# Patient Record
Sex: Female | Born: 1940
Health system: Southern US, Community
[De-identification: ages and names within clinical notes are randomized; demographics above are authoritative.]

## PROBLEM LIST (undated history)

## (undated) DIAGNOSIS — E785 Hyperlipidemia, unspecified: Secondary | ICD-10-CM

## (undated) DIAGNOSIS — K219 Gastro-esophageal reflux disease without esophagitis: Secondary | ICD-10-CM

## (undated) DIAGNOSIS — F329 Major depressive disorder, single episode, unspecified: Secondary | ICD-10-CM

## (undated) DIAGNOSIS — M810 Age-related osteoporosis without current pathological fracture: Secondary | ICD-10-CM

## (undated) DIAGNOSIS — F988 Other specified behavioral and emotional disorders with onset usually occurring in childhood and adolescence: Secondary | ICD-10-CM

## (undated) DIAGNOSIS — M1990 Unspecified osteoarthritis, unspecified site: Secondary | ICD-10-CM

## (undated) DIAGNOSIS — M8580 Other specified disorders of bone density and structure, unspecified site: Secondary | ICD-10-CM

## (undated) DIAGNOSIS — G4730 Sleep apnea, unspecified: Secondary | ICD-10-CM

## (undated) DIAGNOSIS — J189 Pneumonia, unspecified organism: Secondary | ICD-10-CM

## (undated) DIAGNOSIS — E039 Hypothyroidism, unspecified: Secondary | ICD-10-CM

## (undated) DIAGNOSIS — I10 Essential (primary) hypertension: Secondary | ICD-10-CM

## (undated) DIAGNOSIS — C4492 Squamous cell carcinoma of skin, unspecified: Secondary | ICD-10-CM

## (undated) DIAGNOSIS — N200 Calculus of kidney: Secondary | ICD-10-CM

## (undated) DIAGNOSIS — Z87442 Personal history of urinary calculi: Secondary | ICD-10-CM

## (undated) DIAGNOSIS — J449 Chronic obstructive pulmonary disease, unspecified: Secondary | ICD-10-CM

## (undated) DIAGNOSIS — F419 Anxiety disorder, unspecified: Secondary | ICD-10-CM

## (undated) DIAGNOSIS — J45909 Unspecified asthma, uncomplicated: Secondary | ICD-10-CM

## (undated) DIAGNOSIS — K254 Chronic or unspecified gastric ulcer with hemorrhage: Secondary | ICD-10-CM

## (undated) DIAGNOSIS — Z9289 Personal history of other medical treatment: Secondary | ICD-10-CM

## (undated) DIAGNOSIS — T7840XA Allergy, unspecified, initial encounter: Secondary | ICD-10-CM

## (undated) DIAGNOSIS — R011 Cardiac murmur, unspecified: Secondary | ICD-10-CM

## (undated) DIAGNOSIS — I341 Nonrheumatic mitral (valve) prolapse: Secondary | ICD-10-CM

## (undated) DIAGNOSIS — C55 Malignant neoplasm of uterus, part unspecified: Secondary | ICD-10-CM

## (undated) DIAGNOSIS — M8700 Idiopathic aseptic necrosis of unspecified bone: Secondary | ICD-10-CM

## (undated) DIAGNOSIS — J439 Emphysema, unspecified: Secondary | ICD-10-CM

## (undated) DIAGNOSIS — I499 Cardiac arrhythmia, unspecified: Secondary | ICD-10-CM

## (undated) DIAGNOSIS — R0600 Dyspnea, unspecified: Secondary | ICD-10-CM

## (undated) DIAGNOSIS — D649 Anemia, unspecified: Secondary | ICD-10-CM

## (undated) DIAGNOSIS — C3490 Malignant neoplasm of unspecified part of unspecified bronchus or lung: Secondary | ICD-10-CM

## (undated) DIAGNOSIS — F32A Depression, unspecified: Secondary | ICD-10-CM

## (undated) DIAGNOSIS — C4491 Basal cell carcinoma of skin, unspecified: Secondary | ICD-10-CM

## (undated) HISTORY — DX: Essential (primary) hypertension: I10

## (undated) HISTORY — DX: Other specified disorders of bone density and structure, unspecified site: M85.80

## (undated) HISTORY — DX: Malignant neoplasm of unspecified part of unspecified bronchus or lung: C34.90

## (undated) HISTORY — DX: Emphysema, unspecified: J43.9

## (undated) HISTORY — DX: Other specified behavioral and emotional disorders with onset usually occurring in childhood and adolescence: F98.8

## (undated) HISTORY — DX: Hyperlipidemia, unspecified: E78.5

## (undated) HISTORY — DX: Idiopathic aseptic necrosis of unspecified bone: M87.00

## (undated) HISTORY — DX: Anxiety disorder, unspecified: F41.9

## (undated) HISTORY — DX: Malignant neoplasm of uterus, part unspecified: C55

## (undated) HISTORY — DX: Depression, unspecified: F32.A

## (undated) HISTORY — DX: Pneumonia, unspecified organism: J18.9

## (undated) HISTORY — DX: Chronic obstructive pulmonary disease, unspecified: J44.9

## (undated) HISTORY — DX: Cardiac arrhythmia, unspecified: I49.9

## (undated) HISTORY — DX: Age-related osteoporosis without current pathological fracture: M81.0

## (undated) HISTORY — DX: Allergy, unspecified, initial encounter: T78.40XA

## (undated) HISTORY — DX: Major depressive disorder, single episode, unspecified: F32.9

## (undated) HISTORY — DX: Basal cell carcinoma of skin, unspecified: C44.91

## (undated) HISTORY — DX: Anemia, unspecified: D64.9

## (undated) HISTORY — DX: Chronic or unspecified gastric ulcer with hemorrhage: K25.4

## (undated) HISTORY — PX: SKIN CANCER EXCISION: SHX779

## (undated) HISTORY — DX: Calculus of kidney: N20.0

## (undated) HISTORY — PX: HAMMER TOE SURGERY: SHX385

## (undated) HISTORY — DX: Gastro-esophageal reflux disease without esophagitis: K21.9

## (undated) HISTORY — PX: THORACOTOMY/LOBECTOMY: SHX6116

---

## 1986-09-11 HISTORY — PX: ABDOMINAL HYSTERECTOMY: SHX81

## 1986-09-11 HISTORY — PX: SALPINGOOPHORECTOMY: SHX82

## 1999-01-27 ENCOUNTER — Other Ambulatory Visit: Admission: RE | Admit: 1999-01-27 | Discharge: 1999-01-27 | Payer: Self-pay | Admitting: Obstetrics and Gynecology

## 1999-05-29 ENCOUNTER — Emergency Department (HOSPITAL_COMMUNITY): Admission: EM | Admit: 1999-05-29 | Discharge: 1999-05-29 | Payer: Self-pay | Admitting: Emergency Medicine

## 1999-05-29 ENCOUNTER — Encounter: Payer: Self-pay | Admitting: *Deleted

## 2000-04-27 ENCOUNTER — Other Ambulatory Visit: Admission: RE | Admit: 2000-04-27 | Discharge: 2000-04-27 | Payer: Self-pay | Admitting: Internal Medicine

## 2000-05-02 ENCOUNTER — Encounter: Admission: RE | Admit: 2000-05-02 | Discharge: 2000-05-02 | Payer: Self-pay | Admitting: Internal Medicine

## 2000-05-02 ENCOUNTER — Encounter: Payer: Self-pay | Admitting: Internal Medicine

## 2001-12-06 ENCOUNTER — Other Ambulatory Visit: Admission: RE | Admit: 2001-12-06 | Discharge: 2001-12-06 | Payer: Self-pay | Admitting: Internal Medicine

## 2001-12-12 ENCOUNTER — Encounter: Payer: Self-pay | Admitting: Internal Medicine

## 2001-12-12 ENCOUNTER — Encounter: Admission: RE | Admit: 2001-12-12 | Discharge: 2001-12-12 | Payer: Self-pay | Admitting: Internal Medicine

## 2002-12-11 ENCOUNTER — Other Ambulatory Visit: Admission: RE | Admit: 2002-12-11 | Discharge: 2002-12-11 | Payer: Self-pay | Admitting: Internal Medicine

## 2002-12-23 ENCOUNTER — Encounter: Admission: RE | Admit: 2002-12-23 | Discharge: 2002-12-23 | Payer: Self-pay | Admitting: Internal Medicine

## 2002-12-23 ENCOUNTER — Encounter: Payer: Self-pay | Admitting: Internal Medicine

## 2004-03-31 ENCOUNTER — Other Ambulatory Visit: Admission: RE | Admit: 2004-03-31 | Discharge: 2004-03-31 | Payer: Self-pay | Admitting: Internal Medicine

## 2004-04-01 ENCOUNTER — Encounter: Admission: RE | Admit: 2004-04-01 | Discharge: 2004-04-01 | Payer: Self-pay | Admitting: Internal Medicine

## 2004-04-19 ENCOUNTER — Emergency Department (HOSPITAL_COMMUNITY): Admission: EM | Admit: 2004-04-19 | Discharge: 2004-04-19 | Payer: Self-pay | Admitting: Emergency Medicine

## 2004-10-19 ENCOUNTER — Emergency Department (HOSPITAL_COMMUNITY): Admission: EM | Admit: 2004-10-19 | Discharge: 2004-10-19 | Payer: Self-pay | Admitting: Family Medicine

## 2005-06-11 DIAGNOSIS — Z9289 Personal history of other medical treatment: Secondary | ICD-10-CM

## 2005-06-11 HISTORY — PX: THORACOTOMY: SUR1349

## 2005-06-11 HISTORY — DX: Personal history of other medical treatment: Z92.89

## 2005-06-30 ENCOUNTER — Ambulatory Visit: Payer: Self-pay | Admitting: Cardiology

## 2005-06-30 ENCOUNTER — Encounter: Admission: RE | Admit: 2005-06-30 | Discharge: 2005-06-30 | Payer: Self-pay | Admitting: Internal Medicine

## 2005-07-29 ENCOUNTER — Inpatient Hospital Stay (HOSPITAL_COMMUNITY): Admission: AD | Admit: 2005-07-29 | Discharge: 2005-08-18 | Payer: Self-pay | Admitting: Emergency Medicine

## 2005-07-31 ENCOUNTER — Encounter (INDEPENDENT_AMBULATORY_CARE_PROVIDER_SITE_OTHER): Payer: Self-pay | Admitting: Specialist

## 2005-08-02 ENCOUNTER — Encounter (INDEPENDENT_AMBULATORY_CARE_PROVIDER_SITE_OTHER): Payer: Self-pay | Admitting: *Deleted

## 2005-08-10 ENCOUNTER — Encounter: Payer: Self-pay | Admitting: Thoracic Surgery (Cardiothoracic Vascular Surgery)

## 2005-08-23 ENCOUNTER — Encounter: Admission: RE | Admit: 2005-08-23 | Discharge: 2005-08-23 | Payer: Self-pay | Admitting: Surgery

## 2005-09-05 ENCOUNTER — Encounter: Admission: RE | Admit: 2005-09-05 | Discharge: 2005-09-05 | Payer: Self-pay | Admitting: Surgery

## 2005-09-26 ENCOUNTER — Encounter (HOSPITAL_COMMUNITY): Admission: RE | Admit: 2005-09-26 | Discharge: 2005-12-25 | Payer: Self-pay | Admitting: Family Medicine

## 2005-10-17 ENCOUNTER — Encounter: Admission: RE | Admit: 2005-10-17 | Discharge: 2005-10-17 | Payer: Self-pay | Admitting: Surgery

## 2005-10-20 ENCOUNTER — Ambulatory Visit (HOSPITAL_COMMUNITY): Admission: RE | Admit: 2005-10-20 | Discharge: 2005-10-20 | Payer: Self-pay | Admitting: Gastroenterology

## 2005-12-05 ENCOUNTER — Ambulatory Visit: Payer: Self-pay | Admitting: Emergency Medicine

## 2006-01-05 ENCOUNTER — Other Ambulatory Visit: Admission: RE | Admit: 2006-01-05 | Discharge: 2006-01-05 | Payer: Self-pay | Admitting: Family Medicine

## 2006-06-01 ENCOUNTER — Encounter: Admission: RE | Admit: 2006-06-01 | Discharge: 2006-06-01 | Payer: Self-pay | Admitting: Family Medicine

## 2006-06-07 ENCOUNTER — Encounter: Payer: Self-pay | Admitting: Cardiology

## 2006-06-07 ENCOUNTER — Ambulatory Visit: Payer: Self-pay | Admitting: Cardiology

## 2006-06-07 ENCOUNTER — Ambulatory Visit (HOSPITAL_COMMUNITY): Admission: RE | Admit: 2006-06-07 | Discharge: 2006-06-07 | Payer: Self-pay | Admitting: Family Medicine

## 2006-06-07 ENCOUNTER — Ambulatory Visit: Payer: Self-pay

## 2006-06-08 ENCOUNTER — Ambulatory Visit: Payer: Self-pay | Admitting: Cardiology

## 2006-06-18 ENCOUNTER — Ambulatory Visit: Payer: Self-pay | Admitting: Cardiology

## 2006-06-27 ENCOUNTER — Ambulatory Visit: Payer: Self-pay | Admitting: Emergency Medicine

## 2006-07-24 ENCOUNTER — Ambulatory Visit: Payer: Self-pay | Admitting: Emergency Medicine

## 2006-07-25 ENCOUNTER — Ambulatory Visit: Payer: Self-pay | Admitting: Cardiology

## 2006-08-01 ENCOUNTER — Encounter: Admission: RE | Admit: 2006-08-01 | Discharge: 2006-08-01 | Payer: Self-pay | Admitting: Family Medicine

## 2006-08-27 ENCOUNTER — Ambulatory Visit: Payer: Self-pay | Admitting: Emergency Medicine

## 2006-08-28 ENCOUNTER — Ambulatory Visit: Admission: RE | Admit: 2006-08-28 | Discharge: 2006-08-28 | Payer: Self-pay | Admitting: Emergency Medicine

## 2006-08-30 ENCOUNTER — Ambulatory Visit (HOSPITAL_COMMUNITY): Admission: RE | Admit: 2006-08-30 | Discharge: 2006-08-30 | Payer: Self-pay | Admitting: Emergency Medicine

## 2006-08-30 ENCOUNTER — Ambulatory Visit: Payer: Self-pay | Admitting: Emergency Medicine

## 2006-08-30 ENCOUNTER — Encounter (INDEPENDENT_AMBULATORY_CARE_PROVIDER_SITE_OTHER): Payer: Self-pay | Admitting: *Deleted

## 2006-10-29 ENCOUNTER — Ambulatory Visit: Payer: Self-pay | Admitting: Internal Medicine

## 2007-05-23 ENCOUNTER — Ambulatory Visit: Payer: Self-pay | Admitting: Emergency Medicine

## 2007-07-03 DIAGNOSIS — I4949 Other premature depolarization: Secondary | ICD-10-CM | POA: Insufficient documentation

## 2007-07-03 DIAGNOSIS — J309 Allergic rhinitis, unspecified: Secondary | ICD-10-CM | POA: Insufficient documentation

## 2007-07-03 DIAGNOSIS — Z9079 Acquired absence of other genital organ(s): Secondary | ICD-10-CM | POA: Insufficient documentation

## 2007-07-03 DIAGNOSIS — Z8679 Personal history of other diseases of the circulatory system: Secondary | ICD-10-CM | POA: Insufficient documentation

## 2007-07-03 DIAGNOSIS — J869 Pyothorax without fistula: Secondary | ICD-10-CM | POA: Insufficient documentation

## 2007-07-03 DIAGNOSIS — J449 Chronic obstructive pulmonary disease, unspecified: Secondary | ICD-10-CM | POA: Insufficient documentation

## 2007-07-03 DIAGNOSIS — K922 Gastrointestinal hemorrhage, unspecified: Secondary | ICD-10-CM | POA: Insufficient documentation

## 2007-08-15 ENCOUNTER — Encounter: Admission: RE | Admit: 2007-08-15 | Discharge: 2007-08-15 | Payer: Self-pay | Admitting: Family Medicine

## 2008-01-13 ENCOUNTER — Emergency Department (HOSPITAL_COMMUNITY): Admission: EM | Admit: 2008-01-13 | Discharge: 2008-01-13 | Payer: Self-pay | Admitting: Emergency Medicine

## 2008-08-17 ENCOUNTER — Encounter: Admission: RE | Admit: 2008-08-17 | Discharge: 2008-08-17 | Payer: Self-pay | Admitting: Family Medicine

## 2009-03-11 ENCOUNTER — Encounter: Admission: RE | Admit: 2009-03-11 | Discharge: 2009-03-11 | Payer: Self-pay | Admitting: Family Medicine

## 2009-08-11 ENCOUNTER — Telehealth (INDEPENDENT_AMBULATORY_CARE_PROVIDER_SITE_OTHER): Payer: Self-pay

## 2009-08-12 ENCOUNTER — Ambulatory Visit: Payer: Self-pay

## 2009-08-12 ENCOUNTER — Encounter (HOSPITAL_COMMUNITY): Admission: RE | Admit: 2009-08-12 | Discharge: 2009-09-09 | Payer: Self-pay | Admitting: Family Medicine

## 2009-08-12 ENCOUNTER — Encounter: Payer: Self-pay | Admitting: Cardiology

## 2009-08-12 ENCOUNTER — Ambulatory Visit: Payer: Self-pay | Admitting: Cardiology

## 2009-08-13 ENCOUNTER — Ambulatory Visit: Payer: Self-pay

## 2009-08-13 ENCOUNTER — Ambulatory Visit: Payer: Self-pay | Admitting: Cardiology

## 2009-08-13 ENCOUNTER — Ambulatory Visit (HOSPITAL_COMMUNITY): Admission: RE | Admit: 2009-08-13 | Discharge: 2009-08-13 | Payer: Self-pay | Admitting: Family Medicine

## 2009-08-13 ENCOUNTER — Encounter (INDEPENDENT_AMBULATORY_CARE_PROVIDER_SITE_OTHER): Payer: Self-pay | Admitting: Family Medicine

## 2009-08-18 ENCOUNTER — Encounter: Admission: RE | Admit: 2009-08-18 | Discharge: 2009-08-18 | Payer: Self-pay | Admitting: Family Medicine

## 2009-08-25 ENCOUNTER — Encounter: Admission: RE | Admit: 2009-08-25 | Discharge: 2009-08-25 | Payer: Self-pay | Admitting: Family Medicine

## 2009-09-01 ENCOUNTER — Ambulatory Visit: Payer: Self-pay | Admitting: Thoracic Surgery

## 2009-09-06 ENCOUNTER — Ambulatory Visit (HOSPITAL_COMMUNITY): Admission: RE | Admit: 2009-09-06 | Discharge: 2009-09-06 | Payer: Self-pay | Admitting: Thoracic Surgery

## 2009-09-07 ENCOUNTER — Ambulatory Visit: Payer: Self-pay | Admitting: Thoracic Surgery

## 2009-09-14 ENCOUNTER — Ambulatory Visit (HOSPITAL_COMMUNITY): Admission: RE | Admit: 2009-09-14 | Discharge: 2009-09-14 | Payer: Self-pay | Admitting: Thoracic Surgery

## 2009-09-15 ENCOUNTER — Ambulatory Visit: Payer: Self-pay | Admitting: Thoracic Surgery

## 2009-09-22 ENCOUNTER — Telehealth (INDEPENDENT_AMBULATORY_CARE_PROVIDER_SITE_OTHER): Payer: Self-pay | Admitting: *Deleted

## 2009-09-23 ENCOUNTER — Encounter: Payer: Self-pay | Admitting: Thoracic Surgery

## 2009-09-23 ENCOUNTER — Inpatient Hospital Stay (HOSPITAL_COMMUNITY): Admission: RE | Admit: 2009-09-23 | Discharge: 2009-09-29 | Payer: Self-pay | Admitting: Thoracic Surgery

## 2009-09-23 ENCOUNTER — Ambulatory Visit: Payer: Self-pay | Admitting: Thoracic Surgery

## 2009-10-06 ENCOUNTER — Ambulatory Visit: Payer: Self-pay | Admitting: Internal Medicine

## 2009-10-06 ENCOUNTER — Encounter: Admission: RE | Admit: 2009-10-06 | Discharge: 2009-10-06 | Payer: Self-pay | Admitting: Thoracic Surgery

## 2009-10-06 ENCOUNTER — Ambulatory Visit: Payer: Self-pay | Admitting: Thoracic Surgery

## 2009-10-20 LAB — CBC WITH DIFFERENTIAL/PLATELET
BASO%: 0.4 % (ref 0.0–2.0)
Basophils Absolute: 0 10*3/uL (ref 0.0–0.1)
EOS%: 2.5 % (ref 0.0–7.0)
Eosinophils Absolute: 0.2 10*3/uL (ref 0.0–0.5)
HCT: 33.6 % — ABNORMAL LOW (ref 34.8–46.6)
HGB: 11.4 g/dL — ABNORMAL LOW (ref 11.6–15.9)
LYMPH%: 22.6 % (ref 14.0–49.7)
MCH: 32.7 pg (ref 25.1–34.0)
MCHC: 34 g/dL (ref 31.5–36.0)
MCV: 96.1 fL (ref 79.5–101.0)
MONO#: 0.9 10*3/uL (ref 0.1–0.9)
MONO%: 11.2 % (ref 0.0–14.0)
NEUT#: 5 10*3/uL (ref 1.5–6.5)
NEUT%: 63.3 % (ref 38.4–76.8)
Platelets: 357 10*3/uL (ref 145–400)
RBC: 3.5 10*6/uL — ABNORMAL LOW (ref 3.70–5.45)
RDW: 15.3 % — ABNORMAL HIGH (ref 11.2–14.5)
WBC: 7.9 10*3/uL (ref 3.9–10.3)
lymph#: 1.8 10*3/uL (ref 0.9–3.3)

## 2009-10-20 LAB — COMPREHENSIVE METABOLIC PANEL
ALT: 16 U/L (ref 0–35)
AST: 17 U/L (ref 0–37)
Albumin: 4.3 g/dL (ref 3.5–5.2)
Alkaline Phosphatase: 72 U/L (ref 39–117)
BUN: 15 mg/dL (ref 6–23)
CO2: 25 mEq/L (ref 19–32)
Calcium: 9.3 mg/dL (ref 8.4–10.5)
Chloride: 103 mEq/L (ref 96–112)
Creatinine, Ser: 0.64 mg/dL (ref 0.40–1.20)
Glucose, Bld: 90 mg/dL (ref 70–99)
Potassium: 4.1 mEq/L (ref 3.5–5.3)
Sodium: 138 mEq/L (ref 135–145)
Total Bilirubin: 0.3 mg/dL (ref 0.3–1.2)
Total Protein: 6.8 g/dL (ref 6.0–8.3)

## 2009-10-27 ENCOUNTER — Encounter: Admission: RE | Admit: 2009-10-27 | Discharge: 2009-10-27 | Payer: Self-pay | Admitting: Thoracic Surgery

## 2009-10-27 ENCOUNTER — Ambulatory Visit: Payer: Self-pay | Admitting: Thoracic Surgery

## 2009-12-06 ENCOUNTER — Emergency Department (HOSPITAL_COMMUNITY): Admission: EM | Admit: 2009-12-06 | Discharge: 2009-12-06 | Payer: Self-pay | Admitting: Emergency Medicine

## 2009-12-07 ENCOUNTER — Ambulatory Visit: Payer: Self-pay | Admitting: Thoracic Surgery

## 2009-12-15 ENCOUNTER — Encounter: Admission: RE | Admit: 2009-12-15 | Discharge: 2009-12-15 | Payer: Self-pay | Admitting: Thoracic Surgery

## 2009-12-15 ENCOUNTER — Ambulatory Visit: Payer: Self-pay | Admitting: Thoracic Surgery

## 2009-12-28 ENCOUNTER — Ambulatory Visit: Payer: Self-pay | Admitting: Thoracic Surgery

## 2009-12-28 ENCOUNTER — Inpatient Hospital Stay (HOSPITAL_COMMUNITY): Admission: RE | Admit: 2009-12-28 | Discharge: 2010-01-02 | Payer: Self-pay | Admitting: Thoracic Surgery

## 2009-12-28 ENCOUNTER — Encounter: Payer: Self-pay | Admitting: Thoracic Surgery

## 2010-01-11 ENCOUNTER — Ambulatory Visit: Payer: Self-pay | Admitting: Thoracic Surgery

## 2010-01-11 ENCOUNTER — Encounter: Admission: RE | Admit: 2010-01-11 | Discharge: 2010-01-11 | Payer: Self-pay | Admitting: Thoracic Surgery

## 2010-01-13 ENCOUNTER — Ambulatory Visit: Payer: Self-pay | Admitting: Internal Medicine

## 2010-01-26 ENCOUNTER — Ambulatory Visit: Payer: Self-pay | Admitting: Thoracic Surgery

## 2010-01-26 ENCOUNTER — Encounter: Admission: RE | Admit: 2010-01-26 | Discharge: 2010-01-26 | Payer: Self-pay | Admitting: Thoracic Surgery

## 2010-02-23 ENCOUNTER — Ambulatory Visit: Payer: Self-pay | Admitting: Thoracic Surgery

## 2010-02-23 ENCOUNTER — Encounter: Admission: RE | Admit: 2010-02-23 | Discharge: 2010-02-23 | Payer: Self-pay | Admitting: Thoracic Surgery

## 2010-04-06 ENCOUNTER — Encounter: Admission: RE | Admit: 2010-04-06 | Discharge: 2010-04-06 | Payer: Self-pay | Admitting: Thoracic Surgery

## 2010-04-06 ENCOUNTER — Ambulatory Visit: Payer: Self-pay | Admitting: Thoracic Surgery

## 2010-04-28 ENCOUNTER — Encounter (HOSPITAL_COMMUNITY): Admission: RE | Admit: 2010-04-28 | Discharge: 2010-06-10 | Payer: Self-pay | Admitting: Internal Medicine

## 2010-04-28 ENCOUNTER — Encounter: Payer: Self-pay | Admitting: Internal Medicine

## 2010-06-08 ENCOUNTER — Encounter: Admission: RE | Admit: 2010-06-08 | Discharge: 2010-06-08 | Payer: Self-pay | Admitting: Thoracic Surgery

## 2010-06-08 ENCOUNTER — Ambulatory Visit: Payer: Self-pay | Admitting: Thoracic Surgery

## 2010-06-11 ENCOUNTER — Encounter (HOSPITAL_COMMUNITY)
Admission: RE | Admit: 2010-06-11 | Discharge: 2010-09-09 | Payer: Self-pay | Source: Home / Self Care | Attending: Internal Medicine | Admitting: Internal Medicine

## 2010-08-09 ENCOUNTER — Encounter: Admission: RE | Admit: 2010-08-09 | Discharge: 2010-08-09 | Payer: Self-pay | Admitting: Thoracic Surgery

## 2010-08-09 ENCOUNTER — Ambulatory Visit: Payer: Self-pay | Admitting: Thoracic Surgery

## 2010-09-11 ENCOUNTER — Encounter (HOSPITAL_COMMUNITY)
Admission: RE | Admit: 2010-09-11 | Discharge: 2010-10-11 | Payer: Self-pay | Source: Home / Self Care | Attending: Internal Medicine | Admitting: Internal Medicine

## 2010-09-28 ENCOUNTER — Encounter
Admission: RE | Admit: 2010-09-28 | Discharge: 2010-09-28 | Payer: Self-pay | Source: Home / Self Care | Attending: Family Medicine | Admitting: Family Medicine

## 2010-10-02 ENCOUNTER — Encounter: Payer: Self-pay | Admitting: Internal Medicine

## 2010-10-11 ENCOUNTER — Other Ambulatory Visit: Payer: Self-pay | Admitting: Thoracic Surgery

## 2010-10-11 DIAGNOSIS — D381 Neoplasm of uncertain behavior of trachea, bronchus and lung: Secondary | ICD-10-CM

## 2010-10-12 ENCOUNTER — Ambulatory Visit: Admit: 2010-10-12 | Payer: Self-pay | Admitting: Thoracic Surgery

## 2010-10-12 ENCOUNTER — Ambulatory Visit: Payer: Medicare Other | Admitting: Thoracic Surgery

## 2010-10-12 ENCOUNTER — Ambulatory Visit
Admission: RE | Admit: 2010-10-12 | Discharge: 2010-10-12 | Disposition: A | Discharge status: Home / Self Care | Payer: Medicare Other | Source: Ambulatory Visit | Attending: Thoracic Surgery | Admitting: Thoracic Surgery

## 2010-10-12 DIAGNOSIS — C3410 Malignant neoplasm of upper lobe, unspecified bronchus or lung: Secondary | ICD-10-CM

## 2010-10-12 DIAGNOSIS — D381 Neoplasm of uncertain behavior of trachea, bronchus and lung: Secondary | ICD-10-CM

## 2010-10-13 NOTE — Assessment & Plan Note (Signed)
Summary: Cardiology Nuclear Study  Nuclear Med Background Indications for Stress Test: Evaluation for Ischemia   History: COPD, Echo, Myocardial Perfusion Study  History Comments: 9/05 MPS:NL,EF=70%, NSVT at peak. '07 Echo: EF=55-65%.  Symptoms: DOE    Nuclear Pre-Procedure Cardiac Risk Factors: Family History - CAD, History of Smoking, Hypertension, Lipids Caffeine/Decaff Intake: None NPO After: 9:00 PM Lungs: clear IV 0.9% NS with Angio Cath: 22g     IV Site: (R) Wrist IV Started by: Irean Hong RN Chest Size (in) 38     Cup Size C     Height (in): 67.5 Weight (lb): 169 BMI: 26.17  Nuclear Med Study 1 or 2 day study:  1 day     Stress Test Type:  Eugenie Birks Reading MD:  Marca Ancona, MD     Referring MD:  T.Pickard Resting Radionuclide:  Technetium 27m Tetrofosmin     Resting Radionuclide Dose:  11 mCi  Stress Radionuclide:  Technetium 68m Tetrofosmin     Stress Radionuclide Dose:  33 mCi   Stress Protocol   Lexiscan: 0.4 mg   Stress Test Technologist:  Milana Na EMT-P     Nuclear Technologist:  Domenic Polite CNMT  Rest Procedure  Myocardial perfusion imaging was performed at rest 45 minutes following the intravenous administration of Myoview Technetium 73m Tetrofosmin.  Stress Procedure  The patient received IV Lexiscan 0.4 mg over 15-seconds.  Myoview injected at 30-seconds.  There were no significant changes with infusion.  Quantitative spect images were obtained after a 45 minute delay.  QPS Raw Data Images:  Normal; no motion artifact; normal heart/lung ratio. Stress Images:  There is normal uptake in all areas. Rest Images:  Normal homogeneous uptake in all areas of the myocardium. Subtraction (SDS):  There is no evidence of scar or ischemia. Transient Ischemic Dilatation:  1.09  (Normal <1.22)  Lung/Heart Ratio:  .31  (Normal <0.45)  Quantitative Gated Spect Images QGS EDV:  87 ml QGS ESV:  20 ml QGS EF:  77 % QGS cine images:  Normal wall  motion.    Overall Impression  Exercise Capacity: Lexiscan study BP Response: Normal blood pressure response. Clinical Symptoms: Chest pressure ECG Impression: No significant ST segment change suggestive of ischemia. Overall Impression: Normal stress nuclear study.

## 2010-10-13 NOTE — Progress Notes (Signed)
Summary: Nuc. Pre-Procedure  Phone Note Outgoing Call Call back at cell [phone]   Call placed by: Irean Hong, RN,  August 11, 2009 10:43 AM Summary of Call: Reviewed information on Myoview Information Sheet (see scanned document for further details).  Spoke with patient.     Nuclear Med Background Indications for Stress Test: Evaluation for Ischemia   History: COPD, Echo, Myocardial Perfusion Study  History Comments: 9/05 MPS:NL,EF=70%, NSVT at peak. '07 Echo: EF=55-65%.  Symptoms: DOE    Nuclear Pre-Procedure Cardiac Risk Factors: Family History - CAD, History of Smoking, Hypertension, Lipids

## 2010-10-13 NOTE — Progress Notes (Signed)
   Phone Note Other Incoming   Caller: Olegario Messier Action Taken: Information Sent Initial call taken by: Marijean Niemann LOV,Echo over to [facility] to fax [phone] [facility]  September 22, 2009 1:21 PM

## 2010-10-13 NOTE — Miscellaneous (Signed)
Summary: Pulmo/[place]  Pulmo/Lester   Imported By: Lester Paris 05/02/2010 10:42:03  _____________________________________________________________________  External Attachment:    Type:   Image     Comment:   External Document

## 2010-10-24 NOTE — Letter (Addendum)
October 12, 2010  Dietrich Pates, MD [address] [place], [place]  [address]  Re:  [name], [name]             DOB:  Jul 05, 1941  Dear Dr. Okey Dupre,  I appreciate the opportunity to see [name].  I reviewed your notes and I agree with you that this long-term followup is all that is needed now.  We got a chest x-ray today which showed normal postoperative changes, incision is well-healed.  She is taking no pain medication. Her blood pressure is 125/79, pulse is 68, respirations 15, O2 sats were 97%.  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  [name], M.D. Electronically Signed  DPB/MEDQ  D:  10/12/2010  T:  10/13/2010  Job:  [id number]  cc:   Broadus John T. Tanya Nones, MD

## 2010-11-27 LAB — BASIC METABOLIC PANEL
BUN: 13 mg/dL (ref 6–23)
BUN: 7 mg/dL (ref 6–23)
BUN: 8 mg/dL (ref 6–23)
CO2: 25 mEq/L (ref 19–32)
CO2: 29 mEq/L (ref 19–32)
CO2: 29 mEq/L (ref 19–32)
Calcium: 7.5 mg/dL — ABNORMAL LOW (ref 8.4–10.5)
Calcium: 7.6 mg/dL — ABNORMAL LOW (ref 8.4–10.5)
Calcium: 7.9 mg/dL — ABNORMAL LOW (ref 8.4–10.5)
Chloride: 99 mEq/L (ref 96–112)
Chloride: 98 mEq/L (ref 96–112)
Chloride: 98 mEq/L (ref 96–112)
Creatinine, Ser: 0.65 mg/dL (ref 0.4–1.2)
Creatinine, Ser: 0.48 mg/dL (ref 0.4–1.2)
Creatinine, Ser: 0.6 mg/dL (ref 0.4–1.2)
GFR calc Af Amer: >60 mL/min (ref >60)
GFR calc Af Amer: >60 mL/min (ref >60)
GFR calc Af Amer: >60 mL/min (ref >60)
GFR calc non Af Amer: >60 mL/min (ref >60)
GFR calc non Af Amer: >60 mL/min (ref >60)
GFR calc non Af Amer: >60 mL/min (ref >60)
Glucose, Bld: 135 mg/dL — ABNORMAL HIGH (ref 70–99)
Glucose, Bld: 105 mg/dL — ABNORMAL HIGH (ref 70–99)
Glucose, Bld: 116 mg/dL — ABNORMAL HIGH (ref 70–99)
Potassium: 3.8 mEq/L (ref 3.5–5.1)
Potassium: 3.5 mEq/L (ref 3.5–5.1)
Potassium: 3.7 mEq/L (ref 3.5–5.1)
Sodium: 132 mEq/L — ABNORMAL LOW (ref 135–145)
Sodium: 131 mEq/L — ABNORMAL LOW (ref 135–145)
Sodium: 132 mEq/L — ABNORMAL LOW (ref 135–145)

## 2010-11-27 LAB — CBC
HCT: 22.7 % — ABNORMAL LOW (ref 36.0–46.0)
HCT: 23.6 % — ABNORMAL LOW (ref 36.0–46.0)
HCT: 39.4 % (ref 36.0–46.0)
HCT: 41.1 % (ref 36.0–46.0)
HCT: 21.6 % — ABNORMAL LOW (ref 36.0–46.0)
HCT: 23.2 % — ABNORMAL LOW (ref 36.0–46.0)
Hemoglobin: 13.7 g/dL (ref 12.0–15.0)
Hemoglobin: 14.4 g/dL (ref 12.0–15.0)
Hemoglobin: 7.9 g/dL — ABNORMAL LOW (ref 12.0–15.0)
Hemoglobin: 8.2 g/dL — ABNORMAL LOW (ref 12.0–15.0)
Hemoglobin: 7.5 g/dL — ABNORMAL LOW (ref 12.0–15.0)
Hemoglobin: 8.1 g/dL — ABNORMAL LOW (ref 12.0–15.0)
MCHC: 34.7 g/dL (ref 30.0–36.0)
MCHC: 34.7 g/dL (ref 30.0–36.0)
MCHC: 34.9 g/dL (ref 30.0–36.0)
MCHC: 35 g/dL (ref 30.0–36.0)
MCHC: 34.7 g/dL (ref 30.0–36.0)
MCHC: 34.8 g/dL (ref 30.0–36.0)
MCV: 95.3 fL (ref 78.0–100.0)
MCV: 95.7 fL (ref 78.0–100.0)
MCV: 95.8 fL (ref 78.0–100.0)
MCV: 95.9 fL (ref 78.0–100.0)
MCV: 94.7 fL (ref 78.0–100.0)
MCV: 95.9 fL (ref 78.0–100.0)
Platelets: 159 10*3/uL (ref 150–400)
Platelets: 182 10*3/uL (ref 150–400)
Platelets: 259 10*3/uL (ref 150–400)
Platelets: 273 10*3/uL (ref 150–400)
Platelets: 177 10*3/uL (ref 150–400)
Platelets: 261 10*3/uL (ref 150–400)
RBC: 2.37 MIL/uL — ABNORMAL LOW (ref 3.87–5.11)
RBC: 2.47 MIL/uL — ABNORMAL LOW (ref 3.87–5.11)
RBC: 4.13 MIL/uL (ref 3.87–5.11)
RBC: 4.3 MIL/uL (ref 3.87–5.11)
RBC: 2.26 MIL/uL — ABNORMAL LOW (ref 3.87–5.11)
RBC: 2.45 MIL/uL — ABNORMAL LOW (ref 3.87–5.11)
RDW: 12.7 % (ref 11.5–15.5)
RDW: 13 % (ref 11.5–15.5)
RDW: 13.1 % (ref 11.5–15.5)
RDW: 13.6 % (ref 11.5–15.5)
RDW: 13.4 % (ref 11.5–15.5)
RDW: 13.6 % (ref 11.5–15.5)
WBC: 12.5 10*3/uL — ABNORMAL HIGH (ref 4.0–10.5)
WBC: 14.7 10*3/uL — ABNORMAL HIGH (ref 4.0–10.5)
WBC: 8.7 10*3/uL (ref 4.0–10.5)
WBC: 9.6 10*3/uL (ref 4.0–10.5)
WBC: 10.3 10*3/uL (ref 4.0–10.5)
WBC: 12.1 10*3/uL — ABNORMAL HIGH (ref 4.0–10.5)

## 2010-11-27 LAB — URINALYSIS, ROUTINE W REFLEX MICROSCOPIC
Bilirubin Urine: NEGATIVE
Glucose, UA: NEGATIVE mg/dL
Hgb urine dipstick: NEGATIVE
Ketones, ur: NEGATIVE mg/dL
Nitrite: NEGATIVE
Protein, ur: NEGATIVE mg/dL
Specific Gravity, Urine: 1.024 (ref 1.005–1.030)
Urobilinogen, UA: 0.2 mg/dL (ref 0.0–1.0)
pH: 5.5 (ref 5.0–8.0)

## 2010-11-27 LAB — BLOOD GAS, ARTERIAL
Acid-base deficit: 0.6 mmol/L (ref 0.0–2.0)
Bicarbonate: 23 mEq/L (ref 20.0–24.0)
Drawn by: 313941
FIO2: 0.21 %
O2 Saturation: 97.1 %
Patient temperature: 98.6
TCO2: 24.1 mmol/L (ref 0–100)
pCO2 arterial: 34.2 mmHg — ABNORMAL LOW (ref 35.0–45.0)
pH, Arterial: 7.442 — ABNORMAL HIGH (ref 7.350–7.400)
pO2, Arterial: 88 mmHg (ref 80.0–100.0)

## 2010-11-27 LAB — COMPREHENSIVE METABOLIC PANEL
ALT: 16 U/L (ref 0–35)
ALT: 21 U/L (ref 0–35)
AST: 25 U/L (ref 0–37)
AST: 25 U/L (ref 0–37)
Albumin: 2.5 g/dL — ABNORMAL LOW (ref 3.5–5.2)
Albumin: 4.3 g/dL (ref 3.5–5.2)
Alkaline Phosphatase: 35 U/L — ABNORMAL LOW (ref 39–117)
Alkaline Phosphatase: 66 U/L (ref 39–117)
BUN: 16 mg/dL (ref 6–23)
BUN: 7 mg/dL (ref 6–23)
CO2: 21 mEq/L (ref 19–32)
CO2: 26 mEq/L (ref 19–32)
Calcium: 7.5 mg/dL — ABNORMAL LOW (ref 8.4–10.5)
Calcium: 9.8 mg/dL (ref 8.4–10.5)
Chloride: 101 mEq/L (ref 96–112)
Chloride: 103 mEq/L (ref 96–112)
Creatinine, Ser: 0.53 mg/dL (ref 0.4–1.2)
Creatinine, Ser: 0.65 mg/dL (ref 0.4–1.2)
GFR calc Af Amer: >60 mL/min (ref >60)
GFR calc Af Amer: >60 mL/min (ref >60)
GFR calc non Af Amer: >60 mL/min (ref >60)
GFR calc non Af Amer: >60 mL/min (ref >60)
Glucose, Bld: 108 mg/dL — ABNORMAL HIGH (ref 70–99)
Glucose, Bld: 126 mg/dL — ABNORMAL HIGH (ref 70–99)
Potassium: 3.5 mEq/L (ref 3.5–5.1)
Potassium: 4.4 mEq/L (ref 3.5–5.1)
Sodium: 132 mEq/L — ABNORMAL LOW (ref 135–145)
Sodium: 134 mEq/L — ABNORMAL LOW (ref 135–145)
Total Bilirubin: 0.3 mg/dL (ref 0.3–1.2)
Total Bilirubin: 0.5 mg/dL (ref 0.3–1.2)
Total Protein: 4.5 g/dL — ABNORMAL LOW (ref 6.0–8.3)
Total Protein: 6.7 g/dL (ref 6.0–8.3)

## 2010-11-27 LAB — TYPE AND SCREEN
ABO/RH(D): O POS
Antibody Screen: NEGATIVE

## 2010-11-27 LAB — POCT I-STAT 3, ART BLOOD GAS (G3+)
Acid-base deficit: 1 mmol/L (ref 0.0–2.0)
Bicarbonate: 23 mEq/L (ref 20.0–24.0)
O2 Saturation: 98 %
Patient temperature: 98.4
TCO2: 24 mmol/L (ref 0–100)
pCO2 arterial: 36.2 mmHg (ref 35.0–45.0)
pH, Arterial: 7.41 — ABNORMAL HIGH (ref 7.350–7.400)
pO2, Arterial: 94 mmHg (ref 80.0–100.0)

## 2010-11-27 LAB — APTT
aPTT: 27 seconds (ref 24–37)
aPTT: 29 seconds (ref 24–37)

## 2010-11-27 LAB — PROTIME-INR
INR: 0.83 (ref 0.00–1.49)
INR: 0.88 (ref 0.00–1.49)
Prothrombin Time: 11.3 seconds — ABNORMAL LOW (ref 11.6–15.2)
Prothrombin Time: 11.9 seconds (ref 11.6–15.2)

## 2010-11-27 LAB — ABO/RH: ABO/RH(D): O POS

## 2010-11-27 LAB — MRSA PCR SCREENING: MRSA by PCR: NEGATIVE

## 2010-11-29 LAB — CBC
HCT: 22.9 % — ABNORMAL LOW (ref 36.0–46.0)
HCT: 23.1 % — ABNORMAL LOW (ref 36.0–46.0)
HCT: 25.9 % — ABNORMAL LOW (ref 36.0–46.0)
HCT: 28.5 % — ABNORMAL LOW (ref 36.0–46.0)
HCT: 40.5 % (ref 36.0–46.0)
Hemoglobin: 14.1 g/dL (ref 12.0–15.0)
Hemoglobin: 7.8 g/dL — ABNORMAL LOW (ref 12.0–15.0)
Hemoglobin: 7.9 g/dL — ABNORMAL LOW (ref 12.0–15.0)
Hemoglobin: 8.8 g/dL — ABNORMAL LOW (ref 12.0–15.0)
Hemoglobin: 9.8 g/dL — ABNORMAL LOW (ref 12.0–15.0)
MCHC: 34.1 g/dL (ref 30.0–36.0)
MCHC: 34.1 g/dL (ref 30.0–36.0)
MCHC: 34.2 g/dL (ref 30.0–36.0)
MCHC: 34.5 g/dL (ref 30.0–36.0)
MCHC: 34.9 g/dL (ref 30.0–36.0)
MCV: 90.9 fL (ref 78.0–100.0)
MCV: 91.2 fL (ref 78.0–100.0)
MCV: 91.5 fL (ref 78.0–100.0)
MCV: 91.7 fL (ref 78.0–100.0)
MCV: 92.1 fL (ref 78.0–100.0)
Platelets: 158 10*3/uL (ref 150–400)
Platelets: 182 10*3/uL (ref 150–400)
Platelets: 201 10*3/uL (ref 150–400)
Platelets: 223 10*3/uL (ref 150–400)
Platelets: 248 10*3/uL (ref 150–400)
RBC: 2.5 MIL/uL — ABNORMAL LOW (ref 3.87–5.11)
RBC: 2.52 MIL/uL — ABNORMAL LOW (ref 3.87–5.11)
RBC: 2.81 MIL/uL — ABNORMAL LOW (ref 3.87–5.11)
RBC: 3.12 MIL/uL — ABNORMAL LOW (ref 3.87–5.11)
RBC: 4.46 MIL/uL (ref 3.87–5.11)
RDW: 15 % (ref 11.5–15.5)
RDW: 15 % (ref 11.5–15.5)
RDW: 15.3 % (ref 11.5–15.5)
RDW: 15.3 % (ref 11.5–15.5)
RDW: 15.4 % (ref 11.5–15.5)
WBC: 10.8 10*3/uL — ABNORMAL HIGH (ref 4.0–10.5)
WBC: 12 10*3/uL — ABNORMAL HIGH (ref 4.0–10.5)
WBC: 8.4 10*3/uL (ref 4.0–10.5)
WBC: 8.8 10*3/uL (ref 4.0–10.5)
WBC: 8.9 10*3/uL (ref 4.0–10.5)

## 2010-11-29 LAB — TYPE AND SCREEN
ABO/RH(D): O POS
Antibody Screen: NEGATIVE

## 2010-11-29 LAB — POCT I-STAT 3, ART BLOOD GAS (G3+)
Bicarbonate: 24.7 mEq/L — ABNORMAL HIGH (ref 20.0–24.0)
O2 Saturation: 89 %
Patient temperature: 99.5
TCO2: 26 mmol/L (ref 0–100)
pCO2 arterial: 40.9 mmHg (ref 35.0–45.0)
pH, Arterial: 7.39 (ref 7.350–7.400)
pO2, Arterial: 59 mmHg — ABNORMAL LOW (ref 80.0–100.0)

## 2010-11-29 LAB — PROTIME-INR
INR: 1 (ref 0.00–1.49)
Prothrombin Time: 13.1 seconds (ref 11.6–15.2)

## 2010-11-29 LAB — GLUCOSE, CAPILLARY
Glucose-Capillary: 102 mg/dL — ABNORMAL HIGH (ref 70–99)
Glucose-Capillary: 106 mg/dL — ABNORMAL HIGH (ref 70–99)
Glucose-Capillary: 108 mg/dL — ABNORMAL HIGH (ref 70–99)
Glucose-Capillary: 113 mg/dL — ABNORMAL HIGH (ref 70–99)
Glucose-Capillary: 114 mg/dL — ABNORMAL HIGH (ref 70–99)
Glucose-Capillary: 114 mg/dL — ABNORMAL HIGH (ref 70–99)
Glucose-Capillary: 116 mg/dL — ABNORMAL HIGH (ref 70–99)
Glucose-Capillary: 119 mg/dL — ABNORMAL HIGH (ref 70–99)
Glucose-Capillary: 120 mg/dL — ABNORMAL HIGH (ref 70–99)
Glucose-Capillary: 123 mg/dL — ABNORMAL HIGH (ref 70–99)
Glucose-Capillary: 124 mg/dL — ABNORMAL HIGH (ref 70–99)
Glucose-Capillary: 126 mg/dL — ABNORMAL HIGH (ref 70–99)
Glucose-Capillary: 127 mg/dL — ABNORMAL HIGH (ref 70–99)
Glucose-Capillary: 131 mg/dL — ABNORMAL HIGH (ref 70–99)
Glucose-Capillary: 132 mg/dL — ABNORMAL HIGH (ref 70–99)
Glucose-Capillary: 145 mg/dL — ABNORMAL HIGH (ref 70–99)
Glucose-Capillary: 151 mg/dL — ABNORMAL HIGH (ref 70–99)
Glucose-Capillary: 155 mg/dL — ABNORMAL HIGH (ref 70–99)
Glucose-Capillary: 164 mg/dL — ABNORMAL HIGH (ref 70–99)

## 2010-11-29 LAB — COMPREHENSIVE METABOLIC PANEL
ALT: 19 U/L (ref 0–35)
ALT: 21 U/L (ref 0–35)
AST: 25 U/L (ref 0–37)
AST: 26 U/L (ref 0–37)
Albumin: 2.5 g/dL — ABNORMAL LOW (ref 3.5–5.2)
Albumin: 4.5 g/dL (ref 3.5–5.2)
Alkaline Phosphatase: 46 U/L (ref 39–117)
Alkaline Phosphatase: 73 U/L (ref 39–117)
BUN: 14 mg/dL (ref 6–23)
BUN: 8 mg/dL (ref 6–23)
CO2: 22 mEq/L (ref 19–32)
CO2: 28 mEq/L (ref 19–32)
Calcium: 10 mg/dL (ref 8.4–10.5)
Calcium: 7.9 mg/dL — ABNORMAL LOW (ref 8.4–10.5)
Chloride: 105 mEq/L (ref 96–112)
Chloride: 106 mEq/L (ref 96–112)
Creatinine, Ser: 0.59 mg/dL (ref 0.4–1.2)
Creatinine, Ser: 0.63 mg/dL (ref 0.4–1.2)
GFR calc Af Amer: >60 mL/min (ref >60)
GFR calc Af Amer: >60 mL/min (ref >60)
GFR calc non Af Amer: >60 mL/min (ref >60)
GFR calc non Af Amer: >60 mL/min (ref >60)
Glucose, Bld: 120 mg/dL — ABNORMAL HIGH (ref 70–99)
Glucose, Bld: 137 mg/dL — ABNORMAL HIGH (ref 70–99)
Potassium: 3.8 mEq/L (ref 3.5–5.1)
Potassium: 4.3 mEq/L (ref 3.5–5.1)
Sodium: 136 mEq/L (ref 135–145)
Sodium: 137 mEq/L (ref 135–145)
Total Bilirubin: 0.4 mg/dL (ref 0.3–1.2)
Total Bilirubin: 0.6 mg/dL (ref 0.3–1.2)
Total Protein: 4.6 g/dL — ABNORMAL LOW (ref 6.0–8.3)
Total Protein: 7.2 g/dL (ref 6.0–8.3)

## 2010-11-29 LAB — BLOOD GAS, ARTERIAL
Acid-Base Excess: 0.9 mmol/L (ref 0.0–2.0)
Bicarbonate: 24.7 mEq/L — ABNORMAL HIGH (ref 20.0–24.0)
Drawn by: 206361
FIO2: 0.21 %
O2 Saturation: 82.2 %
Patient temperature: 98.6
TCO2: 25.8 mmol/L (ref 0–100)
pCO2 arterial: 37.1 mmHg (ref 35.0–45.0)
pH, Arterial: 7.438 — ABNORMAL HIGH (ref 7.350–7.400)
pO2, Arterial: 48.1 mmHg — ABNORMAL LOW (ref 80.0–100.0)

## 2010-11-29 LAB — BASIC METABOLIC PANEL
BUN: 10 mg/dL (ref 6–23)
BUN: 8 mg/dL (ref 6–23)
CO2: 26 mEq/L (ref 19–32)
CO2: 29 mEq/L (ref 19–32)
Calcium: 7.6 mg/dL — ABNORMAL LOW (ref 8.4–10.5)
Calcium: 8.2 mg/dL — ABNORMAL LOW (ref 8.4–10.5)
Chloride: 101 mEq/L (ref 96–112)
Chloride: 102 mEq/L (ref 96–112)
Creatinine, Ser: 0.46 mg/dL (ref 0.4–1.2)
Creatinine, Ser: 0.62 mg/dL (ref 0.4–1.2)
GFR calc Af Amer: >60 mL/min (ref >60)
GFR calc Af Amer: >60 mL/min (ref >60)
GFR calc non Af Amer: >60 mL/min (ref >60)
GFR calc non Af Amer: >60 mL/min (ref >60)
Glucose, Bld: 104 mg/dL — ABNORMAL HIGH (ref 70–99)
Glucose, Bld: 133 mg/dL — ABNORMAL HIGH (ref 70–99)
Potassium: 3.8 mEq/L (ref 3.5–5.1)
Potassium: 4 mEq/L (ref 3.5–5.1)
Sodium: 130 mEq/L — ABNORMAL LOW (ref 135–145)
Sodium: 136 mEq/L (ref 135–145)

## 2010-11-29 LAB — URINALYSIS, ROUTINE W REFLEX MICROSCOPIC
Bilirubin Urine: NEGATIVE
Glucose, UA: NEGATIVE mg/dL
Hgb urine dipstick: NEGATIVE
Ketones, ur: NEGATIVE mg/dL
Nitrite: NEGATIVE
Protein, ur: NEGATIVE mg/dL
Specific Gravity, Urine: 1.015 (ref 1.005–1.030)
Urobilinogen, UA: 0.2 mg/dL (ref 0.0–1.0)
pH: 8 (ref 5.0–8.0)

## 2010-11-29 LAB — APTT: aPTT: 26 seconds (ref 24–37)

## 2010-12-04 LAB — POCT I-STAT, CHEM 8
BUN: 13 mg/dL (ref 6–23)
Calcium, Ion: 1.02 mmol/L — ABNORMAL LOW (ref 1.12–1.32)
Chloride: 106 mEq/L (ref 96–112)
Creatinine, Ser: 0.5 mg/dL (ref 0.4–1.2)
Glucose, Bld: 103 mg/dL — ABNORMAL HIGH (ref 70–99)
HCT: 39 % (ref 36.0–46.0)
Hemoglobin: 13.3 g/dL (ref 12.0–15.0)
Potassium: 4 mEq/L (ref 3.5–5.1)
Sodium: 139 mEq/L (ref 135–145)
TCO2: 27 mmol/L (ref 0–100)

## 2010-12-04 LAB — DIFFERENTIAL
Basophils Absolute: 0 10*3/uL (ref 0.0–0.1)
Basophils Relative: 1 % (ref 0–1)
Eosinophils Absolute: 0.1 10*3/uL (ref 0.0–0.7)
Eosinophils Relative: 1 % (ref 0–5)
Lymphocytes Relative: 16 % (ref 12–46)
Lymphs Abs: 1.5 10*3/uL (ref 0.7–4.0)
Monocytes Absolute: 0.8 10*3/uL (ref 0.1–1.0)
Monocytes Relative: 8 % (ref 3–12)
Neutro Abs: 6.8 10*3/uL (ref 1.7–7.7)
Neutrophils Relative %: 74 % (ref 43–77)

## 2010-12-04 LAB — CBC
HCT: 37.4 % (ref 36.0–46.0)
Hemoglobin: 12.3 g/dL (ref 12.0–15.0)
MCHC: 32.9 g/dL (ref 30.0–36.0)
MCV: 93.2 fL (ref 78.0–100.0)
Platelets: 307 10*3/uL (ref 150–400)
RBC: 4.01 MIL/uL (ref 3.87–5.11)
RDW: 14.9 % (ref 11.5–15.5)
WBC: 9.2 10*3/uL (ref 4.0–10.5)

## 2010-12-12 LAB — GLUCOSE, CAPILLARY: Glucose-Capillary: 87 mg/dL (ref 70–99)

## 2011-01-16 ENCOUNTER — Other Ambulatory Visit: Payer: Self-pay | Admitting: Thoracic Surgery

## 2011-01-16 DIAGNOSIS — C3410 Malignant neoplasm of upper lobe, unspecified bronchus or lung: Secondary | ICD-10-CM

## 2011-01-17 ENCOUNTER — Ambulatory Visit
Admission: RE | Admit: 2011-01-17 | Discharge: 2011-01-17 | Disposition: A | Discharge status: Home / Self Care | Payer: Medicare Other | Source: Ambulatory Visit | Attending: Thoracic Surgery | Admitting: Thoracic Surgery

## 2011-01-17 ENCOUNTER — Ambulatory Visit (INDEPENDENT_AMBULATORY_CARE_PROVIDER_SITE_OTHER): Payer: Medicare Other | Admitting: Thoracic Surgery

## 2011-01-17 DIAGNOSIS — C3410 Malignant neoplasm of upper lobe, unspecified bronchus or lung: Secondary | ICD-10-CM

## 2011-01-17 DIAGNOSIS — C3490 Malignant neoplasm of unspecified part of unspecified bronchus or lung: Secondary | ICD-10-CM

## 2011-01-18 NOTE — Assessment & Plan Note (Signed)
OFFICE VISIT  MADELYNNE, LASKER DOB:  10-30-1940                                        Jan 17, 2011 CHART #:  [id number]  The patient's blood pressure is 120/76, pulse is 66, respirations 16 and O2 sats were 96%.  Lungs are clear to auscultation and percussion.  Her chest x-ray is stable.  She is back from vacation and goes to re-care her husband who has had previous shoulder surgery done at [facility].  She is doing well overall.  I planned to see her back again in 4 months and 6 months with another chest x-ray.  She got a CT scan at [facility] in 3 months.  [name], M.D. Electronically Signed  DPB/MEDQ  D:  01/17/2011  T:  01/18/2011  Job:  [id number]

## 2011-01-24 NOTE — Assessment & Plan Note (Signed)
OFFICE VISIT   [name], [name]  DOB:  1941-03-22                                        April 06, 2010  CHART #:  [id number]   ADDENDUM   Regarding the nodular density in the right upper lung zone, Dr. Edwyna Shell  felt this was secondary to the surgery as the rib was disrupted  secondary to extensive lung adhesions.  We will continue to follow on  CAT scan as well as chest x-ray.   [name], M.D.  Electronically Signed   DZ/MEDQ  D:  04/06/2010  T:  04/07/2010  Job:  [id number]   cc:   Dietrich Pates, MD

## 2011-01-24 NOTE — Letter (Signed)
October 27, 2009   Velora Heckler. Arbutus Ped, MD  [address]  [place], [place] [address]   Re:  [name], [name]             DOB:  06/11/1941   Dear Arbutus Ped,   I saw the patient back today, and her chest x-ray continues to improve.  We removed her left upper lobe lesion.  She still has the right upper  lobe lesion for which she was going to get another CT scan in May, and I  will see her back again at that time.  Her pain medication is  decreasing.  I appreciate the opportunity of taking care of the patient.  Her blood pressure was 128/79, pulse 77, respirations 18, sats were 97%.   Sincerely,   [name], M.D.  Electronically Signed   DPB/MEDQ  D:  10/27/2009  T:  10/28/2009  Job:  [id number]

## 2011-01-24 NOTE — Letter (Signed)
Jan 11, 2010   Dr. Bryn Gulling  [facility].   Re:  [name], [name]             DOB:  1941/03/30   Dear Dr. Christain Sacramento;   The patient came back for a first postoperative followup.  Her blood  pressure was 135/80, pulse 92, respirations 18, sats were 96% on 2 L and  97% without oxygen.  She initially went home on some oxygen and  excessive shortness of breath but this has gradually improved.  On the  left side, we had originally done a wedge resection; however, on the  right side we went to take out the right upper lobe.  We wedged the  lesion out and found that the margins were positive and we ended up  doing a right upper lobectomy with node dissection and also biopsied one  area in the right middle lobe.  She was found to have adenocarcinoma as  she had on the left side. The secondary margins were negative.  However,  there was one area in the right middle lobe that showed adenomatous  changes, so I think she definitely has multicentric disease, and this is  always a question of what to do with these findings.  Today, she is  still having a moderate amount of pain with fentanyl 25 mcg patch as  well as taking Percocet p.r.n. but her pain is decreasing and she is  feeling better.  Incisions were well healed.  Removed her chest tube  sutures.  We will plan to see her back again in 2 weeks for further  followup.  We will be sending you a copy of the path report and  discharge summary as well as the history and physical for your  evaluation.  I appreciate your input as the patient has requested.   I appreciate the opportunity of seeing the patient.   Sincerely,   [name], M.D.  Electronically Signed   DPB/MEDQ  D:  01/11/2010  T:  01/12/2010  Job:  [id number]   cc:   Lajuana Matte, MD  Priscille Heidelberg. Pamalee Leyden, MD

## 2011-01-24 NOTE — Letter (Signed)
September 15, 2009   Priscille Heidelberg. Pamalee Leyden, MD  [address]  [place], [place] [address]   Re:  KENADY, DOXTATER             DOB:  23-Aug-1941   Dear Dr. 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  Her blood  pressure is 152/77, pulse 81, respirations 18, sats were 95%.  We had a  long discussion about what to do and I have recommended GI resection of  this left upper lobe lesion with node sampling.  If that is negative,  then we will have to decide what to do on the right side where there is  the right upper lobe lesion and some questionable lesions in the right  lower lobe.  I think the first thing to do is to take out left upper  lobe lesion, so we can send this off for complete analysis as far as the  type and subtype and whether is responsive to drugs such as Tarceva.  I'll let you know how things progress.   Sincerely,   [name], M.D.  Electronically Signed   DPB/MEDQ  D:  09/15/2009  T:  09/16/2009  Job:  [id number]

## 2011-01-24 NOTE — Assessment & Plan Note (Signed)
[facility]                             PULMONARY OFFICE NOTE   KERRIN, MARKMAN                    MRN:          [id number]  DATE:05/23/2007                            DOB:          02/28/1941    SUBJECTIVE:  Ms. Lalley is a 70 year old woman with a history of mild  air flow limitation and an abnormal CT scan of the chest.  Her last CT  scan was performed on October 29, 2006.  This showed some stable, vague  areas of nonspecific patchy density in the upper lobes with some  bibasilar atelectasis versus scarring.  We reviewed the results of this  test by phone and decided at that time we would not pursue any  fiberoptic bronchoscopy or other diagnostic studies.  She reports today  telling me that her breathing has been doing fairly well.  She has been  experiencing some upper airway-type symptoms over the last two months.  In particular, she has noticed some fatigueability in her voice and  changes in the tenor of her voice with a feeling of fullness and  tightness in her throat that occasionally causes shortness of breath.  She states that she has had more nasal congestion recently, but the  throat and voice symptoms pre-date the nasal congestion.  Otherwise, she  has been doing fairly well.  She notes that she was started on an ACE  inhibitor, lisinopril, about six months ago.  She has also been on  Fosamax for 15-20 years.   CURRENT MEDICATIONS:  1. Hyoscyamine dose unknown, once daily.  2. Multivitamin once daily.  3. Niacin once daily.  4. Aspirin 81 mg daily.  5. Prilosec 20 mg daily.  6. Zoloft 50 mg daily.  7. Synthroid 100 mcg daily.  8. Flonase 2 sprays each nostril once daily.  9. Flaxseed oil once daily.  10.Lisinopril, dose not known, once daily.  11.Fosamax weekly.   PHYSICAL EXAMINATION:  In general, this is a well-appearing, comfortable  woman on room air.  Her weight is 170 pounds.  Temperature 98.1, blood pressure  128/76,  heart rate 67.  SpO2 96% on room air.  HEENT:  The oropharynx is somewhat narrowed.  There is no posterior  pharyngeal erythema.  NECK:  Without lymphadenopathy or stridor.  LUNGS:  Clear to auscultation bilaterally.  She has no crackles or  wheezes.  HEART:  Regular without murmur.  ABDOMEN:  Obese, soft and nontender with positive bowel sounds.  EXTREMITIES:  No clubbing, cyanosis or edema.   LABORATORY:  Bronchial washings from December, 2007 were negative for  AFB and fungal organisms.   IMPRESSION:  1. History of mild nonspecific infiltrates on CT scan that are culture-      negative on bronchoscopy and are stable via CT scan in February of      this month.  2. Upper airway irritation and episodes of voice changing and probable      vocal cord dysfunction.  Contributing factors include possible      gastroesophageal reflux disease and certainly persistent postnasal      drip.  PLAN:  1. I will increase her Prilosec to 20 mg b.i.d.  2. I will add loratadine to her Flonase.  3. She will return to see me in six weeks.  If she continues to have      upper airway symptoms, I would consider changing her lisinopril to      an angiotensin receptor blocker.  4. She likely needs repeat CT scan of her chest in February of next      year.  We will discuss the scheduling of this test at her next      visit.     Leslye Peer, MD  Electronically Signed    RSB/MedQ  DD: 05/23/2007  DT: 05/23/2007  Job #: [id number]   cc:   Madolyn Frieze. Jens Som, MD, [facility]  Carola J. Gerri Spore, M.D.

## 2011-01-24 NOTE — Assessment & Plan Note (Signed)
OFFICE VISIT   [name], [name]  DOB:  18-Jul-1941                                        April 06, 2010  CHART #:  [id number]   HISTORY OF PRESENT ILLNESS:  This is a 70 year old Caucasian female who  is status post right thoracotomy, right upper lobectomy, and nerve  dissection by Dr. Edwyna Shell on December 28, 2009.  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  The patient was  last seen in the office on February 23, 2010, at which time her only  complaint was moderate chest wall pain.  She was given a refill  prescription for hydrocodone.  The patient has recently started  chemotherapy at [facility].  She has had two treatments.  She was  recently diagnosed with esophagitis and mouth ulcers and given Magic  Mouth Wash and Carafate.  She also has complaints of increased swelling,  especially around her abdominal area.  She does still have some chest  wall pain, left greater than right anterior ribs.  She denies any fever,  chills.   PHYSICAL EXAMINATION:  General:  This is a pleasant 70 year old  Caucasian female who is accompanied by her husband and grandson.  She is  in no acute distress.  She is alert, oriented, and cooperative.  BP  108/70, heart rate 76, respirations 18, O2 saturation 95% on room air.  Cardiovascular:  Regular rate and rhythm.  Pulmonary:  Clear to  auscultation bilaterally.  No rales, wheezes, or rhonchi.  Right  posterior chest wound well healed.   Chest x-ray done today shows a nodule densely in the right upper lung  zone and postop changes in the hemothoraces bilaterally.  No pleural  effusions or pneumothorax.   IMPRESSION AND PLAN:  The patient continues to recover from her right  lung surgery for well-differentiated bronchioalveolar carcinoma.  She is  going to see Dr. Okey Dupre at the beginning of August and has a CAT scan  of the chest ordered for May 10, 2010.  She is going to return to  see  Dr. Edwyna Shell in 2 months with a chest x-ray.  She is to contact our  office, however,  if she has any questions, problems, or concerns in the interim.  Regarding the patient's chest wall pain, hopefully this will continue to  lessen over the next couple of months.   [name], M.D.  Electronically Signed   DZ/MEDQ  D:  04/06/2010  T:  04/07/2010  Job:  [id number]   cc:   Dietrich Pates, MD

## 2011-01-24 NOTE — Letter (Signed)
Jan 26, 2010   Dr. Bryn Gulling  [facility]   Re:  [name]             DOB:  [birth year]   I saw the patient back today and she apparently received recommendations  from [facility] that she undergo chemotherapy.  She is going  to see Dr. Darliss Cheney and due to get another opinion.  Her incisions  are well healed.  She is having a moderate postoperative pain, but  overall is doing reasonably well.  Her blood pressure was 136/85, pulse  88, respiration 20, sats were 96%.  She has weaned herself off oxygen  and her chest x-ray continues to improve.  We will see her back again in  4 weeks with a chest x-ray.   [name], M.D.  Electronically Signed   DPB/MEDQ  D:  01/26/2010  T:  01/27/2010  Job:  [id number]   cc:   Broadus John T. Pamalee Leyden, MD  Lajuana Matte, MD

## 2011-01-24 NOTE — Letter (Signed)
September 01, 2009   Priscille Heidelberg. Pamalee Leyden, MD  [address]  [place], [place] [address]   Re:  [name], [name]             DOB:  10/24/1940   Dear Dr. Tanya Nones:   I appreciate the opportunity of seeing the patient.  My partner, Dr.  Laneta Simmers, did a left VATS and drainage of an empyema and a left lung  decortication in 2006 after bilateral pneumonia and empyema.  She now  has returned with a left upper lobe lesion, a right upper lobe lesion,  and 2 small lesions in the right frontal lobe, right middle lobe, right  lower lobe.  She is 70 years of age, quit smoking in 2006.  She has had  no fever, chills, or excessive sputum, and no hemoptysis.   MEDICATIONS:  1. Aspirin 81 mg every other day.  2. Sertraline 100 mg daily.  3. Lisinopril/hydrochlorothiazide 10/12.5 daily.  4. Synthroid 100 mg daily.  5. Trazodone 50 mg p.r.n.  6. Simvastatin 40 mg daily.   ALLERGIES:  Erythromycin, doxycycline, and sulfa.   PAST MEDICAL HISTORY:  She has hypertension, GERD, hypothyroidism,  chronic obstructive pulmonary disease, and hypercholesterolemia.   FAMILY HISTORY:  Positive for hypertension and coronary artery disease.   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  Quit smoking in 2006.  Has a  glass of wine at night.   REVIEW OF SYSTEMS:  GENERAL:  She is 172 pounds.  She is 5 feet 7  inches.  In general, her weight has been stable.  CARDIAC:  She has had a heart murmur.  No angina.  PULMONARY:  See history of present illness.  Bronchitis.  GI:  She has got reflux.  GU:  No kidney disease, dysuria, or frequent urination.  VASCULAR:  No claudication, DVT, TIAs.  NEUROLOGIC:  No dizziness, headaches, blackouts, seizures.  MUSCULOSKELETAL:  No arthritis or joint pain.  PSYCHIATRIC:  Some depression.  ENT:  No change in her eyesight.  She has been wearing her hearing aids  since 2006.  HEMATOLOGIC:  No problems with  bleeding, clotting disorders, or anemia.   PHYSICAL EXAMINATION:  General:  She is a well-developed Caucasian  female in no acute.  Head, Eyes, Ears, Nose, and Throat:  Unremarkable.  Neck:  Supple without thyromegaly.  There is no supraclavicular or  axillary adenopathy.  Chest:  Clear to auscultation and percussion.  Left thoracotomy scar.  Abdomen:  Soft.  There is no splenomegaly.  Extremities:  Pulses are 2+.  There is no clubbing or edema.  Neurologic:  She is oriented x3.  Sensory and motor intact.  Cranial  nerves intact.   ASSESSMENT AND PLAN:  I am quite concerned about these 2 lesions.  They  could very well be bronchoalveolar cancer.  We will go ahead and get a  PET scan on her, although that may not be positive given that it is  probably a slow-growing cancer, but once we see that then I will make  arrangements and send her for some type of biopsy.  I discussed this in  detail with her and her husband and they agree with this plan.  I  appreciate the opportunity of seeing the patient.   Sincerely,   [name], M.D.  Electronically Signed   DPB/MEDQ  D:  09/01/2009  T:  09/02/2009  Job:  [id number] 

## 2011-01-24 NOTE — Letter (Signed)
September 08, 2009   Priscille Heidelberg. Pamalee Leyden, MD  [address]  [place], [place] [address]   Re:  Donna, [name]             DOB:  November 09, 1940   Dear Dr. Tanya Nones,   I saw the patient back today for followup.  Her blood pressure was  142/86, pulse 66, respirations 18, sats were 95%.  The lung function  tests showed that she had an FVC of 2.69 or 75% of predicted with an FEV-  1 of 1.68 or 66% of predicted.  Her diffusion capacity was 53%.  A PET  scan was done and showed slight increased uptakes in her left upper lobe  and right upper lobe lesions with an evidence of 1.8 which could be  compatible with a low-grade bronchoalveolar cancer especially in the  lower lobe where it did not show any uptake but very small.  We still  had not ruled out multicentric bronchoalveolar cancer, so I have  scheduled to try get a lung biopsy of the left upper lobe lesion.  They  should have the least chance of pneumothorax since that is where her  previous surgery was found.  We will see her back again after a lung  biopsy.   Sincerely,   [name], M.D.  Electronically Signed   DPB/MEDQ  D:  09/08/2009  T:  09/09/2009  Job:  [id number]

## 2011-01-24 NOTE — Letter (Signed)
December 15, 2009   Priscille Heidelberg. Pamalee Leyden, MD  [address]  [place], [place] [address]   Re:  STARLIT, RABURN             DOB:  1941/06/12   Dear Dr. Tanya Nones:   I saw the patient back today, and after a long discussion we have agreed  to go ahead and do a right VATS and resection of the right upper lobe  posterior lesion.  This is what was recommended at the tumor board at  [facility] where she went for a second opinion.  She had an endobronchial  ultrasound of a 4R node in [place] which was negative.  At the time of  her surgery, we will plan to do a complete node analysis.  I will  routinely set this up for December 28, 2009.  I appreciate the opportunity  of seeing the patient.   Sincerely,   [name], M.D.  Electronically Signed   DPB/MEDQ  D:  12/15/2009  T:  12/16/2009  Job:  [id number]   cc:   Lajuana Matte, MD

## 2011-01-24 NOTE — Assessment & Plan Note (Signed)
OFFICE VISIT   BROOKLYNNE, PEREIDA  DOB:  1940-09-16                                        June 08, 2010  CHART #:  [id number]   HISTORY:  The patient comes in today for 34-month follow up.  She is  status post a right upper lobectomy in April 2011 for bronchoalveolar  carcinoma.  She is currently undergoing chemotherapy at [facility] under the  care of Dr. Okey Dupre.  Her main complaint today is chronic mouth ulcers  which have been persistent since she began chemotherapy.  She continues  to have a lot of mouth pain related to this, for which she has been  taking Magic mouthwash and oxycodone.  She has a return visit in about 5  days and has 2 more chemotherapy treatments left.  From a pulmonary  standpoint, she has been stable.  She is having no pain in her chest and  no shortness of breath.   PHYSICAL EXAMINATION:  Vital Signs:  Blood pressure is 139/80, pulse is  88, respirations 18, O2 sat 98% on room air.  Chest:  Her right  thoracotomy incision has healed well.  Lungs:  Clear.  Heart:  Regular  rate and rhythm.   IMAGING:  Chest x-ray shows stable right upper lobe nodule, otherwise is  stable.   ASSESSMENT AND PLAN:  The patient continues to progress well from a  right video-assisted thoracoscopic surgery, right upper lobectomy.  Dr.  Edwyna Shell saw the patient today and reviewed her chest x-ray.  I have given  her a prescription for oxycodone 10 mg 1-2 q.4-6 h. p.r.n. for pain #30  as she does not have enough medication until her appointment on Monday  at [facility].  We will  plan to see her back in 2 months for follow up with a CT scan or sooner  if she develops any problems in the interim.   Coral Ceo, P.A.   GC/MEDQ  D:  06/08/2010  T:  06/09/2010  Job:  [id number]   cc:   Broadus John T. Pamalee Leyden, MD  Dietrich Pates, MD  Dr. Rogue Jury

## 2011-01-24 NOTE — Letter (Signed)
October 06, 2009   Priscille Heidelberg. Pamalee Leyden, MD  [address]  [place], [place] [address]   Re:  [name], [name]             DOB:  08-Nov-1940   Dear Dr. Tanya Nones:   I saw the patient back today.  She underwent a resection of her left  upper lobe lesion with bronchoalveolar cancer and adenocarcinoma.  It  looks like today, her incisions are well healed.  I removed her chest  tube sutures.  She can gradually increase her activities.  I gave her a  refill for hydrocodone, #60, 5 mg.  Her blood pressure was 106/50, pulse  81, respirations 18, and sats were 98%.  She had an ALK-1 gene, which  was negative.  I am waiting for the EGFR to come back.  I will refer her  to Dr. Arbutus Ped for a consultation.  I appreciate the opportunity of  seeing the patient.   Sincerely,   [name], M.D.  Electronically Signed   DPB/MEDQ  D:  10/06/2009  T:  10/06/2009  Job:  [id number]

## 2011-01-24 NOTE — Letter (Signed)
December 07, 2009   Lajuana Matte, MD  [phone]  [place], [place] [address]   Re:  JEANE, CASHATT             DOB:  10-18-1940   Dear Arbutus Ped:   I saw the patient back today for followup.  She went up to [facility] for a  second opinion and they were somewhat worried about her 4R node and did  a EBUS on that and results of that is still pending.  She had severe  chest pain yesterday and went to the emergency room and got a CT scan  that shows on the left side that things or the reaction that we saw is  gradually improving.  The right upper lobe lesion is unchanged, but  there is some areas of some possible inflammatory areas in the right  upper lobe and right lower lobe.  These were new from the CT scan that  she had previously done at [facility].  Her blood pressure was 148/18,  pulse 69, respirations 18, and saturations were 98%.  Lungs are clear to  auscultation and percussion.  Her incisions are well-healed.  The pain  she is having is getting better, since she has been started on  ibuprofen.  She is also a Lidoderm patch and she will get a refill of  her pain medication.  I added Flexeril since it sounds like this may be  some type of spasm, spasm and the pain and we will see what happens.  I  will see her back again in 2 weeks with a chest x-ray.  She is supposed  to get a recommendation from the oncologist and surgeon at [facility] in  the next 2-3 days.   [name], M.D.  Electronically Signed   DPB/MEDQ  D:  12/07/2009  T:  12/08/2009  Job:  [id number]   cc:   Gilmore Laroche, MD

## 2011-01-24 NOTE — Assessment & Plan Note (Signed)
OFFICE VISIT   [name], [name]  DOB:  [birth year]                                        August 09, 2010  CHART #:  [id number]   HISTORY:  The patient is status post right thoracotomy with right upper  lobectomy with wedge resection right middle lobe.  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  She underwent this procedure  on December 28, 2009.  The patient was last seen in the office on May 31, 2010.  She presents back today for a 74-month followup visit.  The  patient states she has finished up with her chemotherapy at [facility].  She  states she did not receive her last treatment secondary to jaw death.  Currently, she is on amoxicillin and second medication at this time for  treatment of this jaw death.  The patient's state has slowly improved.  She does complain of vomiting at times.  She has an appointment to see  Dr. Elnoria Howard in a week from today.  She has an appointment for repeat CT  scan of the chest in 2 months by Dr. Okey Dupre.  She continues to  ambulate and continue with routine activity without difficulty.  She  denies any nausea, vomiting, cough, hemoptysis, or wheezing.  She does  complain of a 10-pound weight loss in 2 months.   PHYSICAL EXAMINATION:  Vital Signs:  Blood pressure 127/80, pulse of 81,  O2 sats 97% on room air.  Respiratory:  Clear to auscultation  bilaterally.  Cardiac:  Regular rate and rhythm.  Incisions all clean,  dry, and intact and healing well.   STUDIES:  The patient had PA and lateral chest x-ray obtained today  which shows no change in nodular opacity in the right mid upper lung  field.  No definite active process.   IMPRESSION AND PLAN:  The patient was seen and evaluated by Dr. Edwyna Shell.  Dr. Edwyna Shell discussed chest x-ray findings with the patient.  He will  plan to see the patient back in 3 months  following her CT scan of the  chest.  The patient is told if she has any  surgical issues in the interim she is to contact us.  The patient is in  agreement.   Sol Blazing, PA   KMD/MEDQ  D:  08/09/2010  T:  08/10/2010  Job:  [id number]   cc:   Broadus John T. Pamalee Leyden, MD  Dietrich Pates, MD  Cleone Slim, MD

## 2011-01-24 NOTE — Letter (Signed)
February 23, 2010   Priscille Heidelberg. Pamalee Leyden, MD  [address]  [place], [place] [address]   Re:  [name], [name]             DOB:  02/16/1941   Dear Dr. Tanya Nones:   I saw the patient back today, and she continues to improve from her  surgery standpoint.  Her blood pressure was 126/81, pulse 84,  respirations 18, sats were 95%.  Lungs were clear to auscultation and  percussion.  She is still having some moderate chest wall pain.  We gave  her refill for a prescription of Hydrocodone 10/325, #60.  She will  start chemotherapy in the next day or so at [facility] in which they will use  carboplatin with Alimta.  I will see her back again in 6 weeks with a  chest x-ray for further followup.   [name], M.D.  Electronically Signed   DPB/MEDQ  D:  02/23/2010  T:  02/24/2010  Job:  [id number]   cc:   Dietrich Pates, MD  Kermit Balo. Cyndra Numbers, MD

## 2011-01-27 NOTE — Op Note (Signed)
[name], [name]             ACCOUNT NO.:  [id number]   MEDICAL RECORD NO.:  [id number]          PATIENT TYPE:  INP   LOCATION:  3304                         FACILITY:  [facility]   PHYSICIAN:  John C. Madilyn Fireman, M.D.    DATE OF BIRTH:  [birth year]   DATE OF PROCEDURE:  08/17/2005  DATE OF DISCHARGE:                                 OPERATIVE REPORT   Dr. Jonny Ruiz a stick for   PROCEDURE:  Esophagogastroduodenoscopy.   ENDOSCOPIST:  Everardo All. Madilyn Fireman, M.D.   INDICATIONS FOR PROCEDURE:  Anemia, heme-positive stools developed during  hospitalization.   DESCRIPTION OF PROCEDURE:  The patient was placed in the left lateral  decubitus position and placed on the pulse monitor with continuous low-flow  oxygen delivered by nasal cannula.  She was sedated with 60 mcg IV fentanyl  and 6 mg IV Versed.  The Olympus video endoscope was advanced under direct  vision into the oropharynx and esophagus.  The esophagus was straight and of  normal caliber. The squamocolumnar line was at 38 cm.  There was no visible  hiatal hernia, ring, stricture or other abnormalities at the GE junction.  The stomach was entered.  A small amount of liquid secretions were suctioned  from the  fundus.  A retroflexed view of cardia was unremarkable.  The  fundus and body appeared normal.  In the distal antrum there a fairly  shallow but large approximately 10 x 15 mm clean- based ulcer with an  adjacent smaller 5 mm ulcer, neither of which had any stigma of hemorrhage.  The pylorus was not deformed and easily allowed passage of the endoscope tip  to the duodenum.  The bulb and second portion were inspected and appeared to  be within normal limits.  The scope was withdrawn back into stomach, and a  CLO-test was obtained. The scope was then withdrawn.  The patient returned  to the recovery room in stable condition.  She tolerated the procedure well.  There were no immediate complications.   IMPRESSION:  One large and one small  gastric, probably responsible for  previous gastrointestinal  bleeding.   PLAN:  Continue treatment with proton pump inhibitor and will await CLO-  test.  Treat for eradication of Helicobacter if positive.           ______________________________  Everardo All. Madilyn Fireman, M.D.     JCH/MEDQ  D:  08/17/2005  T:  08/17/2005  Job:  [id number]   cc:   Evelene Croon, M.D.  [address]  [place]  [place] [address]

## 2011-01-27 NOTE — Op Note (Signed)
NAMEBLAYNE, GARLICK NO.:  [id number]   MEDICAL RECORD NO.:  [id number]          PATIENT TYPE:  INP   LOCATION:  2312                         FACILITY:  [facility]   PHYSICIAN:  Evelene Croon, M.D.     DATE OF BIRTH:  [birth year]   DATE OF PROCEDURE:  08/02/2005  DATE OF DISCHARGE:                                 OPERATIVE REPORT   PREOPERATIVE DIAGNOSES:  Left lower lobe pneumonia with empyema.   POSTOPERATIVE DIAGNOSIS:  Left lower lobe pneumonia with empyema.   OPERATIVE PROCEDURE:  1.  Left video-assisted thoracoscopy.  2.  Left thoracotomy with drainage of empyema and decortication of left      lung.   ATTENDING SURGEON:  Evelene Croon, M.D.   ASSISTANT:  Belenda Cruise Dominic P.A.-C   ANESTHESIA:  General endotracheal.   CLINICAL HISTORY:  This patient is a 70 year old woman who was admitted with  shortness of breath and pleuritic chest pain.  Chest x-ray showed left lower  lobe pneumonia and a large left pleural effusion.  She underwent a  thoracentesis by Radiology which drained 440 mL of cloudy fluid.  A CT scan  of the chest was then obtained which showed dense consolidation of the left  lower lobe as well as some infiltrate in the right mid-lung.  It also showed  a loculated empyema in left pleural space.  After review of this study and  examination of the patient, it was felt that left video-assisted  thoracoscopy and probably left thoracotomy would be required for complete  drainage of this empyema and decortication of the lung.  I discussed the  operative procedure with the patient and her sister including alternatives,  benefits, and risks including bleeding, blood transfusion, infection, injury  to the lung, persistent air leak, and recurrence of empyema.  They  understood and agreed to proceed.   OPERATIVE PROCEDURE:  The patient was taken to the operating room and placed  on the table  in a supine position.  After induction of general endotracheal  anesthesia, using a double-lumen tube, the patient was positioned in the  right lateral decubitus position with the left side up.  Then a time-out was  taken and the proper patient, proper operative site and proper operation  were confirmed between the surgeon, nursing staff, and the anesthesia staff.  Then the left chest was prepped with Betadine soap and solution and draped  in the usual sterile manner.  A Foley catheter had been inserted in the  bladder and venous compression stockings were placed on both lower  extremities.  Then a short incision was made in the mid-axillary line at  about the 6th intercostal space.  Through this, a 10-mm trocar was inserted  into the pleural space.  The thoracoscope was inserted.  There was a large  amount of cloudy fluid as well as diffuse empyema.  This was very organized.  The degree of empyema was not amenable to thoracoscopic drainage alone and  therefore a short left lateral thoracotomy incision was performed.  The  pleural space was entered through the 6th intercostal space laterally.  The  pleural empyema was quite extensive and involving the entire pleural space,  particularly inferiorly and posteriorly.  There were fluid collections  between the upper and lower lobe.  This empyema was completely drained.  There was a thick fibrinous peel covering the lower lobe as well as part of  the upper lobe.  This was removed as completely as possible and I think was  essentially completely removed.  There was dense consolidation of the left  lower lobe noted.  The pleural surface was very friable and oozed blood  easily when the fibrinous peel was removed.  There were no visible air  leaks.  After I felt the empyema was completely drained and the  decortication complete, the pleural space was irrigated with warm saline  solution.  There was mild oozing from the visceral and parietal pleural  surfaces.  Then three 36-French chest tubes were inserted with  a right-angle  tube in the posterior costophrenic recess and a 36-French straight tube in  the anterior chest, and a 36-French straight tube in the posterior chest.  Then the ribs were reapproximated with #2 Vicryl pericostal sutures.  The  intercostal nerves at the site of the incision as well as 1 above and 1  below the incisions were anesthetized with a 0.25% Marcaine injection.  The  muscles were then reapproximated with continuous #1 Vicryl suture in 2  layers.  The subcutaneous tissue was closed with continuous 2-0 Vicryl and  the skin with staples.  The sponge, needle and instrument counts were  correct according to the scrub nurse.  A dry sterile dressing was applied  over the incision and around the tubes, which were hooked to Pleur-evac  suction.  The patient was then turned in the supine position, extubated and  transported to the postanesthesia care unit in satisfactory and stable  condition.      Evelene Croon, M.D.  Electronically Signed     BB/MEDQ  D:  08/02/2005  T:  08/03/2005  Job:  [id number]   cc:   [facility]

## 2011-01-27 NOTE — Op Note (Signed)
NAME:  [name], [name]             ACCOUNT NO.:  [id number]   MEDICAL RECORD NO.:  [id number]          PATIENT TYPE:  AMB   LOCATION:  ENDO                         FACILITY:  [facility]   PHYSICIAN:  John C. Madilyn Fireman, M.D.    DATE OF BIRTH:  [birth year]   DATE OF PROCEDURE:  10/20/2005  DATE OF DISCHARGE:                                 OPERATIVE REPORT   PROCEDURE:  Colonoscopy.   INDICATIONS FOR PROCEDURE:  Anemia with no prior colon screening.   PROCEDURE:  Patient was placed in the left lateral decubitus state and  placed on pulse monitor with continuous low-flow oxygen delivered by nasal  cannula. She was sedated with 2 mg IV Versed and 25 mcg IV fentanyl in  addition to the medicines given for the previous EGD. Olympus video  colonoscope was inserted into the rectum and advanced to the cecum,  confirmed by transillumination of McBurney's point and visualization of the  ileocecal valve and appendiceal orifice. Prep was excellent. The cecum,  ascending, transverse colon appeared normal with no masses, polyps,  diverticula or other mucosal abnormalities. In the descending and sigmoid  colon, there were seen a few scattered diverticula and no other  abnormalities. The rectum appeared normal.  On retroflexed view the anus  revealed no obvious polyps.  The scope was then withdrawn. The patient  returned to the recovery room in stable condition.  She tolerated the  procedure well. There were no immediate complications.   IMPRESSION:  1.  Few scattered left-sided diverticula, otherwise normal study.           ______________________________  Everardo All Madilyn Fireman, M.D.     JCH/MEDQ  D:  10/20/2005  T:  10/20/2005  Job:  [id number]   cc:   Evelene Croon, M.D.  [address]  [place]  [place] [address]

## 2011-01-27 NOTE — Assessment & Plan Note (Signed)
[name]                               PULMONARY OFFICE NOTE   [name], [name]                    MRN:          [id number]  DATE:07/24/2006                            DOB:          1941-04-06    SUBJECTIVE:  [name] is a 70 year old woman who follows up today for  dyspnea.  I last saw her in mid October.  At that time she stated that she  had had slow, progressive worsening and shortness of breath since June, this  was in the setting of about 40 to 50 pounds weight gain and some lower  extremity edema.  She had previously been treated for community-acquired  pneumonia and empyema, status post decortication.  A CT scan of her chest  before our last visit showed that her pneumonia and pleural disease for the  most part resolved, but she did have some very small foci of apical ground-  glass opacities.  For that reason, we had planned to repeat a high-  resolution CT scan in January of 2008.  At our last visit, we arranged for  full pulmonary function testing in order to better assess her progressive  dyspnea.  She has had that performed today, and is here to review the  results.  She continues to have dyspnea, particularly with exertion such as  walking up hills.  She has had some proximal muscle weakness as well.   MEDICATIONS:  1. Multivitamins daily.  2. Niacin daily.  3. Claritin 10 mg daily.  4. Aspirin 81 mg daily.  5. Prilosec 40 mg daily.  6. Potassium 20 m    Eq daily.  7. Lasix 40 mg daily.  8. Soluble fiber once daily.   PHYSICAL EXAMINATION:  GENERAL:  This is a very pleasant, well-appearing  woman who is in no distress on room air.  Her weight is 183 pounds, which is up from 144 pounds at our original visit  back in March of 2007.  Her temperature is 97.4, blood pressure 126/80,  heart rate is 77.  SpO2 99% on room air.  HEENT:  Exam is benign.  LUNGS:  Clear to auscultation bilaterally.  She does have slight end  expiratory wheeze on a forced expiration.  HEART:  Has a regular rate and rhythm without murmur.  ABDOMEN:  Soft, nontender, nondistended, with positive bowel sounds.  EXTREMITIES:  Have 1+ pitting ankle edema.  NEUROLOGIC:  She has grossly nonfocal exam.   Pulmonary function tests done today show some mild airflow limitation with  an FEV1 of 1.87 liters, or 77% of predicted; she does not have a  bronchodilator response.  Her total lung capacity and residual volume are  within normal limits, and her diffusion capacity is normal when corrected  for alveolar volume.   IMPRESSION:  A 70 year old woman with exertional dyspnea in the setting of  weight gain, mild airflow limitation on pulmonary function tests and  possible ground-glass infiltrates on recent CT scan of the chest.  I suspect  that her dyspnea is due to her weight gain and also to some mild chronic  obstructive pulmonary disease, with a history of about 20-pack-years tobacco  use.  It is also possible that she has some other inflammatory process, like  an alveolitis or bronchiolitis, that relates to her ground-glass on her  prior CT.  I will start her on a trial of Spiriva for the next month, to see  if this helps with her exertional dyspnea.  We will also plan to repeat her  high-resolution CT scan of the chest now instead of in January of 2008, to  ensure that her ground-glass is resolved.  She will follow up with me in one  month to assess our progress and review the results of her study.  She will  also work on weight loss and an exercise regimen in the interim.     Donna Peer, MD  Electronically Signed    RSB/MedQ  DD: 07/24/2006  DT: 07/25/2006  Job #: [id number]   cc:   Otilio Connors. Gerri Spore, M.D.  Madolyn Frieze Jens Som, MD, [facility]

## 2011-01-27 NOTE — Discharge Summary (Signed)
NAMECAYDANCE, [name] NO.:  [id number]   MEDICAL RECORD NO.:  [id number]          PATIENT TYPE:  INP   LOCATION:  3304                         FACILITY:  [facility]   PHYSICIAN:  Evelene Croon, M.D.     DATE OF BIRTH:  Aug 15, 1941   DATE OF ADMISSION:  08/01/2005  DATE OF DISCHARGE:                                 DISCHARGE SUMMARY   ADDENDUM:  This is an addendum to discharge summary that was dictated on  August 11, 2005.   The patient was tentatively scheduled for discharge on August 12, 2005 but  this was held due to patient's chest tube not being pulled until August 12, 2005. Followup chest x-ray showed no pneumothorax. Stable chest x-ray.   Possible transfer on August 13, 2005 but the patient was still anemic with  a hemoglobin of 8.1 and hematocrit of 22.9%. Her transfer was held due to  possible blood transfusion. H&H was monitored. The patient's ciprofloxacin  was discontinued on August 13, 2005 due to patient's urine culture showing  yeast in her urine. The patient was continued on vancomycin and Zosyn. She  was sating 98% to 99% on room air. On August 14, 2005, the patient's H&H  had dropped to 7.2 and 21. She received 2 units of packed red blood cells at  this time. Guaiac stools were sent, which also showed to be positive. GI was  consulted at that point. GI started patient on Protonix twice a day and  scheduled her for an EGD for August 16, 2005. On August 15, 2005, the  patient's H&H only increased slightly following the 2 units of blood. It  increased to 7.7 and 20.2. She received another 2 units of packed red blood  cells. An EGD was kept scheduled for August 16, 2005.   The patient was taken down for an EGD but prior to performing the EGD,  noticed temperature of 102.3. EGD was cancelled. The patient was transferred  back up to 3300. Chest x-ray was obtained following notification of  temperature. This was stable. No changes noted. Blood  cultures were also  sent as well as urinalysis was drawn and sent for culture again. On August 17, 2005 the patient was afebrile. Urine culture so far negative. Blood  cultures negative. The patient's H&H was stable at 9.9 and 29.0. The patient  had undergone an EGD on August 17, 2005 showing 2 clean based ulcers. There  is no active bleeding noted. These were biopsied. GI recommended continuing  Protonix b.i.d.  On August 17, 2005 the patient stated she felt much  better. She was out of bed and moving around quite well. A followup CBC will  be obtained August 18, 2005. Will watch for further blood culture results.   DISPOSITION:  As the patient's H&H remain stable and she remains afebrile  with blood cultures negative, plan to discharge home next 1 to 2 days.  Discharge planning will be same as prior dictated. Only difference, is  patient will be placed on Protonix 40 mg p.o. b.i.d. Followup appointment  with Dr. Laneta Simmers on August 22, 2005 at 1:30 p.m. The patient is to obtain a  PA and lateral chest x-ray 1 hour prior to this appointment.      Theda Belfast, [place]      Evelene Croon, M.D.  Electronically Signed    KMD/MEDQ  D:  08/17/2005  T:  08/17/2005  Job:  [id number]   cc:   Everardo All. Madilyn Fireman, M.D.  Fax: [phone]   Evelene Croon, M.D.  [address]  [place]  [place] [address]

## 2011-01-27 NOTE — Discharge Summary (Signed)
NAMEJORDYNNE, [name] NO.:  [id number]   MEDICAL RECORD NO.:  [id number]          PATIENT TYPE:  INP   LOCATION:  3304                         FACILITY:  [facility]   PHYSICIAN:  Evelene Croon, M.D.     DATE OF BIRTH:  04-20-1941   DATE OF ADMISSION:  08/01/2005  DATE OF DISCHARGE:                                 DISCHARGE SUMMARY   ADMISSION DIAGNOSIS:  Pleuritic left-sided chest pain with shortness of  breath.   DISCHARGE DIAGNOSES:  1.  History of arrhythmias:  The patient reports she had premature      ventricular contractures during a stress test.  2.  Remote history of aseptic necrosis.  3.  Bilateral pneumonia left empyema status post thoracentesis, status post      left video-assisted thoracoscopy with empyema drainage and left lung      decortication.  4.  Postoperative thrush resolved.  5.  Postoperative urinary tract infection currently being treated with      antibiotic therapy x5 days.  6.  Thrombocytosis postoperatively.   ALLERGIES:  The patient states that SULFA causes a rash and doxycycline  causes nausea and vomiting.   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 Chest x-ray at the outpatient clinic revealed large  left pleural effusion and left lower lobe pneumonia. The patient was  subsequently admitted by internal medicine and started on IV antibiotics.  She also underwent a left thoracentesis on August 01, 2005.   HOSPITAL COURSE:  The patient was admitted as previously stated with  complaints of shortness of breath and pleuritic chest pain. She was found to  have a large left pleural effusion and left lower lobe pneumonia. Again, she  was started on IV antibiotics and underwent left thoracentesis which  revealed 440 cc of cloudy fluid. Post procedure chest x-ray showed  persistent large effusion and  subsequent chest CT revealed loculated left  empyema with a dense pneumonia on the left lower lobe. Chest x-ray on  August 01, 2005 also revealed small infiltrate in the right mid-lung.  Secondary to the findings, Dr. Evelene Croon, of the CVTS service, was  consulted regarding surgical intervention. He evaluated the patient on  August 01, 2005 and it was his opinion the patient should proceed with  left VATs and possible thoracotomy for drainage of empyema.   The patient was taken to the OR on August 02, 2005 for left video-assisted  thoracoscopic surgery and left thoracotomy with drainage of empyema and  decortication of the left lung. The patient tolerated the procedure well and  was hemodynamically stable immediately postoperatively. The patient was  transferred from the OR to the SICU in stable condition. The patient was  extubated without complication. Woke up from anesthesia neurologically  intact. The patient's postoperative course has progressed as expected with  few complications. Her initial complication was difficulty with ambulation  secondary to shortness of breath. This is subsequently improving and the  patient has been working with  physical therapy throughout the postoperative  course. She has remained on IV Zosyn and vancomycin for her bilateral  pneumonia. She has also been continued on bronchodilators and Mucinex.   The patient's chest x-ray has remained stable, although she continues to  show a left greater than right pneumonia and small bilateral effusions. On  postoperative day seven, the patient was noted to have thrush and she was  therefore started on Diflucan.   On postoperative day eight, the patient was noted to have a thrombocytosis  with a platelet count of 1,134,000. She had remained on enteric-coated  aspirin the entire hospital course.   On postoperative day nine, she is afebrile with stable vital signs and  maintaining normal sinus rhythm. Her  chest x-ray is stable and there is no  air leak present. There is no chest tube drainage present. Her UA did reveal  positive leukocytes and she was therefore started on five-day course of  Cipro.   On physical exam, the patient still complains of mild shortness of breath  with ambulation and difficulty controlling her pain. Physical exam reveals  crackles in the left lower lobe. Cardiac is regular rate and rhythm. The  incisions are clean, dry and intact. There is edema present in the bilateral  lower extremities. The patient is in stable condition at this time. While  she continues to progress in the current manner, she is should be ready for  discharge in the next one to two days pending morning round reevaluation and  resolution of thrombocytosis and urinary tract infection.   LABORATORY DATA:  CBC and BNP on August 11, 2005, white count 22.8,  hemoglobin 8.3, hematocrit 24, platelets 1231. Sodium 132, potassium 3.5,  BUN 7, creatinine 0.7, glucose 95.   CONDITION ON DISCHARGE:  Improved.   DISCHARGE MEDICATIONS:  1.  Tylox 1-2 q.4-6h. p.r.n. pain.  2.  Fosamax weekly.  3.  Toprol XL 25 milligrams daily.  4.  Multivitamin daily.  5.  Mucinex 600 milligrams 2 tablets b.i.d.  6.  Iron 325 milligrams daily.  7.  Aspirin 81 milligrams daily.   DIET:  No restrictions.   ACTIVITY:  No driving for two weeks. No lifting for three weeks and the  patient should increase her activity slowly including daily breathing and  walking exercises.   WOUND CARE:  The patient may shower daily and clean the incisions with soap  and water.   FOLLOW-UP APPOINTMENT:  Follow-up appointment with Dr. Laneta Simmers one week after  discharge with PA and lateral chest x-ray. CVTS office is to contact the  patient with the date and time of this appointment.      Pecola Leisure, [place]      Evelene Croon, M.D.  Electronically Signed   AY/MEDQ  D:  08/11/2005  T:  08/12/2005  Job:  [id number]   cc:    Patient's chart

## 2011-01-27 NOTE — Op Note (Signed)
NAME:  [name], [name] NO.:  [id number]   MEDICAL RECORD NO.:  [id number]          PATIENT TYPE:  AMB   LOCATION:  ENDO                         FACILITY:  [facility]   PHYSICIAN:  Leslye Peer, MD    DATE OF BIRTH:  1940-10-26   DATE OF PROCEDURE:  08/30/2006  DATE OF DISCHARGE:                               OPERATIVE REPORT   PROCEDURE:  Fiberoptic bronchoscopy with bronchoalveolar lavage.  Transbronchial biopsies.   SURGEON:  Leslye Peer, M.D.   INDICATIONS:  Bilateral pulmonary nodules.   MEDICATIONS GIVEN:  1. Fentanyl 100 mcg IV in divided doses.  2. Versed 5 mg IV in divided doses.  3. Lidocaine 1% 25 mL to the tracheobronchial tree.   Consent was obtained from the patient and a signed copy is on her  hospital chart.   PROCEDURE DETAILS:  After informed consent was obtained, conscious  sedation was initiated as outlined above. The fiberoptic bronchoscope  was introduced through the oropharynx without difficulty.  The glottis  was visualized and was normal in appearance.  The cords moved normally  with inspiration and phonation.  Trachea was intubated and local  anesthesia was achieved with 1% lidocaine.  The trachea was normal in  appearance. Examination of the airways revealed no evidence of abnormal  secretions or endobronchial lesions throughout the entire exam.  The  left mainstem, left upper, lingular and left lower lobe bronchi were all  inspected including the superior segment.  Likewise the right mainstem,  right upper lobe, right middle lobe and right lower lobe bronchi were  normal in appearance including the superior segment.  There was an  anomalous takeoff of the apical segment of the right upper lobe which is  a normal variant.  Transbronchial biopsies were performed under  fluoroscopic guidance from the left upper lobe in several different  subsegments (total of 6).  Bronchoalveolar lavage was then performed in  the left upper lobe  with 60 mL of normal saline instilled and  approximately 20 mL returned.  The patient tolerated the procedure well.  Estimated blood loss was between 3-5 mL total with good hemostasis by  the end of case.  There were no obvious complications.  She returned  recovery room in good condition and a postprocedural chest x-ray pending  at time of this dictation.   SPECIMENS:  1. Bronchoalveolar lavage from the left upper lobe to be sent for      cytology, microbiology.  2. Transbronchial biopsies from the left upper lobe to be sent for      pathology.   PLANS:  I will follow-up the pathology and microbiology reports with Ms.  Marschner and contact her with her results in the next few days.      Leslye Peer, MD  Electronically Signed     RSB/MEDQ  D:  08/30/2006  T:  08/30/2006  Job:  [id number]

## 2011-01-27 NOTE — Assessment & Plan Note (Signed)
[facility]                             PULMONARY OFFICE NOTE   ADRIEANNA, [name]                    MRN:          [id number]  DATE:08/27/2006                            DOB:          30-Nov-1940    SUBJECTIVE:  This is a followup visit for Ms. Schewe, last seen  July 24, 2006 for dyspnea and air flow limitation on PFTs. She tells  me that her breathing is somewhat better particularly in the morning  although she does still get short of breath with exertion. Since our  last visit, she has started Spiriva once daily. She denies any wheezing.  Her weight has been stable although as mentioned in previous notes she  had a significant weight gain over 6 months' time. Since our last visit,  it has been noted that her TSH is elevated (which certainly could  explain some of her symptoms over the last several months) and she has  been started on Synthroid. Her allergic rhinitis seems to be poorly  controlled and she has significant nasal congestion especially late in  the day. She denies any significant cough. She has had no fevers or  chills. She is not making any sputum.   MEDICATIONS:  1. Multivitamin once daily.  2. Niacin once daily.  3. Claritin 10 mg daily.  4. Aspirin 81 mg daily.  5. Prilosec 40 mg daily.  6. Potassium 20 mEq daily.  7. Lasix 40 mg daily.  8. Soluble fiber once daily.  9. Spiriva 1 inhalation daily.  10.Synthroid 0.075 mg daily.   PHYSICAL EXAMINATION:  GENERAL:  This is a pleasant overweight woman who  is in no distress on room air.  VITAL SIGNS:  Her temperature is 97.5, blood pressure 132/80, heart rate  70, SPO2 98% on room air.  LUNGS:  Clear to auscultation on normal inspiration and expiration. She  does have some slight wheezing on a forced expiration.  HEART:  Regular rate and rhythm without murmur.  ABDOMEN:  Obese, soft, benign.  EXTREMITIES:  Have no cyanosis or clubbing. She does have 1+ pretibial  edema.   CT scan of the chest showed some scattered small nodular areas of hazy  opacity that were characterized by the radiologist as ground glass.  These appear to be mostly in a vascular distribution and are scattered  throughout both lungs. They are stable from previous imaging.   IMPRESSION:  Air flow limitation with an abnormal CT scan of the chest  with mild nodular ground glass opacities in a vascular pattern. This is  not classic for bronchiolitis, and I do not see any evidence of  bronchiectasis. The differential does include either of these two  processes but also other inflammatory diseases especially granulomatous  disease such as hypersensitivity pneumonitis or even sarcoidosis.   PLAN:  1. I will induce sputum for AFB and fungal organisms.  2. If sputum is negative then we should consider fiberoptic      bronchoscopy with washings and also biopsies to look for both      colonizing organisms and also for granulomatous inflammatory  disease.  3. She will continue her Spiriva for now.  4. Will repeat a CT scan in February 2008 in order to check for      interval change in her nodular disease.  5. She will return to see me in February 2008 or sooner should she      have any other difficulties.     Leslye Peer, MD  Electronically Signed    RSB/MedQ  DD: 08/27/2006  DT: 08/28/2006  Job #: [id number]   cc:   Madolyn Frieze. Jens Som, MD, [facility]  Carola J. Gerri Spore, M.D.  Arturo Morton. Riley Kill, MD, [facility]

## 2011-01-27 NOTE — H&P (Signed)
[name], [name]             ACCOUNT NO.:  [id number]   MEDICAL RECORD NO.:  [id number]          PATIENT TYPE:  INP   LOCATION:  1613                         FACILITY:  [facility]   PHYSICIAN:  Kela Millin, M.D.DATE OF BIRTH:  04-May-1941   DATE OF ADMISSION:  07/29/2005  DATE OF DISCHARGE:                                HISTORY & PHYSICAL   CHIEF COMPLAINT:  Pleuritic chest pain with shortness of breath.   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  Ms. Beirne states  that she was in her usual state of health until about five days ago when she  developed URI symptoms, felt congested and also had a cough productive of  thick phlegm, and then three days ago, she began having fevers.  She states  that since then, she began taking Motrin every 3 hours to keep her fevers  down.  She also developed a left-sided pleuritic chest pain, which she has  had for the past two days, and on the day prior to presentation, she became  short of breath while just trying to get out of bed.  As a result of this,  she went to the [place] walk-in clinic, and her pulse air on room air was  noted to be about 87 with ambulation.  A chest x-ray was done which revealed  a left lung infiltrate, and she was, thus, directly admitted to the [facility]  hospitalist service at [facility].   The patient denies orthopnea, PND, leg swelling, hematochezia or hematemesis  and no dysuria.  The patient denies weight loss.  She admits to one episode  of nausea and vomiting.   PAST MEDICAL HISTORY:  1.  As stated above.  2.  History of arrhythmias.  Reports that she had PVCs during a stress      test.  3.  History of aseptic necrosis, remote.   MEDICATIONS:  1.  Toprol XL 25 mg p.o. daily.  2.  Fosamax p.o. q. Sunday.  3.  Multivitamin p.o. daily.  4.   Niacin 1 p.o. daily.  5.  Vitamin C 1 p.o. daily.  6.  Fish oil 1 p.o. daily.  7.  Motrin 1 p.o. q.3h. for the past few days.   ALLERGIES/INTOLERANCES:  Sulfa causes a rash, and doxycycline causes nausea  and vomiting.   SOCIAL HISTORY:  Positive for tobacco abuse.  Occasional alcohol.   FAMILY HISTORY:  Her mother had an MI at about age 44.  She is also status  post a pacemaker.  Her aunt had breast cancer.   REVIEW OF SYSTEMS:  As per HPI.  Other review of systems negative.   PHYSICAL EXAMINATION:  GENERAL:  The patient is an older white female, who  appears younger than her stated age, in no acute respiratory distress.  VITAL SIGNS:  Temperature 98.7, pulse 94, respiratory rate 19, blood  pressure 116/66.  HEENT:  PERRL.  EOMI.  Sclerae are anicteric.  No oral exudates.  LUNGS:  Crackles present in the left lower lobe.  Moderate air movement.  No  wheezes.  CARDIOVASCULAR:  Regular rate and rhythm.  Normal S1 and S2.  No S3  appreciated.  ABDOMEN:  Soft.  Bowel sounds present.  Nontender and nondistended.  No  organomegaly.  No masses palpable.  EXTREMITIES:  No cyanosis and no edema.  NEURO:  Alert and oriented x 3.  Cranial nerves II-XII grossly intact.  Nonfocal exam.   LABORATORY DATA:  A pH is 7.38, pCO2 33.7, pO2 66, O2 sat 93.8%.  The white  cell count is 13.2, hemoglobin 13.4, hematocrit 38.6, platelet count 223.  Sodium is 131 with a potassium of 3.8, chloride 97, CO2 21, glucose 102, BUN  41, creatinine 2.2.  Total bili is 0.7, alkaline phosphatase 71, AST 40, ALT  29.  Cardiac enzymes are negative.   ASSESSMENT/PLAN:  1.  Pneumonia, left lung.  We will obtain blood cultures and start empiric      antibiotics.  We will also add expectorant and p.r.n. nebulizers.  2.  Acute renal failure, likely secondary to volume depletion and      nonsteroidal anti-inflammatory drug use.  We will hydrate, follow and      recheck.  3.  History of arrhythmias.  We will continue  Toprol, obtain baseline EKG      and follow.  4.  Tobacco abuse.  5.  Volume depletion/hyponatremia.  We will hydrate and recheck.           ______________________________  Kela Millin, M.D.     ACV/MEDQ  D:  07/30/2005  T:  07/30/2005  Job:  [id number]   cc:   Oley Balm. Georgina Pillion, M.D.  Fax: [phone]

## 2011-01-27 NOTE — Op Note (Signed)
NAME:  [name], [name]             ACCOUNT NO.:  [id number]   MEDICAL RECORD NO.:  [id number]          PATIENT TYPE:  AMB   LOCATION:  ENDO                         FACILITY:  [facility]   PHYSICIAN:  John C. Madilyn Fireman, M.D.    DATE OF BIRTH:  22-Aug-1941   DATE OF PROCEDURE:  DATE OF DISCHARGE:                                 OPERATIVE REPORT   INDICATIONS FOR PROCEDURE:  History of bleeding gastric ulcer with  significant anemia.  70 year old patient status post three months' treatment  with proton pump inhibitor. She is also undergoing screening colonoscopy  today.   PROCEDURE:  The patient placed in the left lateral decubitus position and  placed on pulse monitor with continuous low-flow oxygen delivered by nasal  cannula. She was sedated with 75 mcg IV fentanyl and 8 milligrams IV Versed  as well as 12.5 mg IV Phenergan. The Olympus video endoscope was advanced  under direct vision into the oropharynx and esophagus.  Esophagus was  straight and of normal caliber with the squamocolumnar line at 38 cm and  slightly irregular indicating mild esophagitis and no definite erosions or  exudate.  The stomach was entered and a small amount of liquid secretions  suctioned.  Retroflex view of the cardia was unremarkable.  Fundus and body  appeared normal.  Distal antrum there were two small stellate scars  __________ ulcers remaining.  CLO test was obtained.  Pylorus __________  duodenum.  Both bulb and second portion were well inspected and appeared to  be within normal limits.  Scope was then withdrawn.  Patient was prepared  for colonoscopy.  She tolerated the procedure well without immediate  complications.   IMPRESSION:  1.  Healing of previous gastric ulcers with interval 3+ esophagitis.   PLAN:  Await CLO test and probably just GI therapy as long as not taking  nonsteroidal anti-inflammatory drugs.           ______________________________  Everardo All Madilyn Fireman, M.D.     JCH/MEDQ  D:   10/20/2005  T:  10/20/2005  Job:  [id number]   cc:   Evelene Croon, M.D.  [address]  [place]  [place] [address]

## 2011-01-27 NOTE — Assessment & Plan Note (Signed)
[facility]                              CARDIOLOGY OFFICE NOTE   ISMAEL, TREPTOW                    MRN:          [id number]  DATE:06/08/2006                            DOB:          Sep 12, 1940    HISTORY OF PRESENT ILLNESS:  Ms. [name] is a 70 year old female who I have  seen in the past for atypical chest pain and palpitations.  She did have a  nuclear study in September of 2005 that showed a normal LV function and no  scar ischemia.  An echocardiogram in September, 2005 showed normal LV  function. Previous abdominal ultrasound showed no significant aneurysm.  She  was recently treated for a left empyema by report.  Her postoperative course  was complicated by a GI bleed.  This was back in November of 40981. She  initially did well. However over the past few months she has had progressive  dyspnea on exertion as well as edema.  She was initially placed on  hydrochlorothiazide but she did not improve. She was placed on Lasix 20 mg  p.o. daily approximately 1 week ago but still did not improve.  She saw Dr.  [name] yesterday and her Lasix was increased and she is beginning to  diurese.  Scott performed a BMET which showed normal renal function.  A BNP  was normal. Urinalysis showed no protein. She had an echocardiogram  yesterday that shows normal LV function.  There was mild to moderate mitral  regurgitation and mild left atrial enlargement. She also apparently has had  CAT scans of her abdomen that showed no masses. CT of her chest had several  ground glass opacities but Scott did discuss this with Dr. Solon Augusta and he felt  this may be residual scarring.  I have not seen those results myself.  She  also apparently has had thyroid and other laboratories checked by her  primary care physician but I do not have those records available.   MEDICATION LIST:  1. Ibuprofen 400 mg p.o. b.i.d.  2. Multivitamin.  3. Niacin.  4. Claritin.  5.  Aspirin.  6. Toprol 25 mg p.o. daily.  7. Prilosec.  8. Zoloft.  9. Lasix 40 mg p.o. daily.  10.Potassium 20 mEq daily.   PHYSICAL EXAM TODAY:  VITAL SIGNS:  Blood pressure of 136/82, pulse is 60.  NECK:  Supple and there is no jugular venous distension.  CHEST:  Clear to auscultation.  CARDIOVASCULAR EXAM:  Reveals a regular rate and rhythm.  ABDOMEN:  Exam is benign with no masses appreciated.  EXTREMITIES:  Show 2+ edema to the knee on the right, and 1+ on the left.  There are no cords palpated.   DIAGNOSES:  1. New onset edema.  2. History of mitral valve prolapse with mild to moderate mitral      regurgitation.  3. Recent treatment for empyema/pneumonia.  4. Preserved left ventricular function.   PLAN:  Ms. Lehane presents for edema of uncertain etiology. Note, her  echocardiogram showed normal LV function and she does not have jugular  venous distension on exam.  Her electrocardiogram is normal as is her BNP.  Her renal function is also normal and urinalysis shows no protein. She has  had Doppler's previously that showed no deep venous thrombosis and the  abdominal and pelvic CT showed no masses.  She has also had a significant  amount of other lab work drawn at Dr. Delanna Ahmadi office. We will have that  forwarded to [place] so we can review this. I am not clear on the etiology. We  certainly should check thyroid tests if they have not been done, although  she says they were normal.  We also should check liver functions and an  albumin.  She will continue on the Lasix and potassium and we will check a  BMET in 1 weeks to follow up her potassium and renal function. I will see  her back in 3 months but I do not think this is most likely cardiac related.            ______________________________  Madolyn Frieze. Jens Som, MD, [facility]     BSC/MedQ  DD:  06/08/2006  DT:  06/10/2006  Job #:  [id number]   cc:   Otilio Connors. Gerri Spore, M.D.

## 2011-01-27 NOTE — Assessment & Plan Note (Signed)
[facility]                              CARDIOLOGY OFFICE NOTE   DEKLYN, TRACHTENBERG                    MRN:          [id number]  DATE:06/07/2006                            DOB:          12/08/1940    Primary care physician is Dr. Carolin Coy.   Primary cardiologist,  Olga Millers.   HISTORY OF PRESENT ILLNESS:  [name] is a very pleasant 70 year old  female patient previously seen by Dr. Jens Som with a history of PVCs and  PACs and non-sustained ventricular tachycardia at the time of stress  Cardiolite in September, 2005.  At that time, her perfusion images were  normal.  An echocardiogram showed a normal ejection fraction.  She did have  questionable history of mitral valve prolapse. On her echocardiogram she had  trace mitral regurgitation.  Since Dr. Jens Som last saw her in October,  2006, she developed empyema and was admitted to [facility] and  underwent left video assisted thoracoscopy and drainage with decortication.  Her hospital course was complicated by gastrointestinal bleed and she  underwent a colonoscopy and endoscopy.  She saw Dr. Delton Coombes in March, 2007.  At that time, she was slowly recovering.  She tells me that about July,  2007, she started developing increasing weight and edema as well as  shortness of breath with exertion.  She notes shortness of breath with just  minimal exertion.  She denies any chest pain.  She sleeps on 2 pillows.  She  denies and paroxysmal nocturnal dyspnea.  She denies any palpitations.  Denies any syncope or pre-syncope.  The patient had been prescribed HCTZ and  then Lasix by her primary care physician without any improvement in her  edema.  She has had multiple tests performed including chest x-ray, which  was normal, lower extremity Dopplers, which were normal, done last week and  a chest CT and abdominal pelvic CT done this morning.  She has had TSH  checked, which she  notes was normal, as well as comprehensive metabolic  panel and CBC.   PAST MEDICAL HISTORY:  As noted above is significant for:  1. Premature ventricular contractions, premature atrial contractions,      brief non-sustained ventricular tachycardia as well as palpitations.      Nuclear study in September, 2005 showed no ischemia.  She has normal LV      function.  She has trace mitral regurgitation by last echocardiogram in      September, 2005.  2. She has a history of recent hospitalization for left-sided pneumonia      status post left VATS for empyema.  3. Gastrointestinal bleeds status post colonoscopy and endoscopy,  4. Allergic rhinitis and sinusitis.  5. Status post left oophorectomy.   ALLERGIES:  ERYTHROMYCIN, SULFA, DOXICYCLINE.   MEDICATIONS:  1. Ibuprofen 400 mg b.i.d.  2. Multivitamin daily.  3. Niacin.  4. Claritin.  5. Aspirin 81 mg daily.  6. Metoprolol 25 mg daily.  7. Prilosec 20 mg daily.  8. Zoloft 50 mg.  9. Lasix 20 mg daily.   SOCIAL HISTORY:  The patient  works as a Engineer, civil (consulting) at [employer].  She  denies any tobacco or alcohol abuse.   FAMILY HISTORY:  Significant for coronary disease in her mother who had her  first myocardial infarction in her 45s.   REVIEW OF SYSTEMS:  Please see __________ .  The patient denies any fevers,  chills or cough.  No hematochezia, hematuria, headaches, blurry vision,  dysphagia, odynophagia, nausea, vomiting, diarrhea, constipation.  Other  systems negative.   PHYSICAL EXAMINATION:  GENERAL:  She is a well developed, well nourished  female.  VITAL SIGNS:  Blood pressure:  142/90.  Pulse:  59.  Weight:  180 lb.  HEAD:  Normocephalic, atraumatic.  Eyes:  PERRLA.  EOMI.  Sclerae clear and  nonicteric.  Oropharynx pink, no exudates.  TMs are clear bilaterally.  NECK:  Without JVD.  Carotids without bruits bilaterally.  ENDOCRINE:  Without thyromegaly.  LYMPH:  Without lymphadenopathy.  CARDIOVASCULAR:  Normal S1, S2.   Regular rate and rhythm.  There is a 1-2/6  systolic ejection murmur heard best at the apex.  LUNGS:  Clear to auscultation bilaterally.  No wheezes, rhonchi or rales.  ABDOMEN:  Distended with normoactive bowel sounds.  No organomegaly.  No  rebound, no guarding.  EXTREMITIES:  With 1-2+ edema bilaterally with the right being greater than  the left.  Dorsalis pedis and posterior tibialis pulses 2+ bilaterally.  SKIN:  Warm and dry.  NEUROLOGIC:  She is alert and oriented x3.  Cranial nerves II-XII grossly  intact.   STUDIES:  Electrocardiogram reveals sinus rhythm with a heart rate of 59,  left axis deviation, no acute changes.  Data base chest and abdominal/pelvic  CT done this morning showed bilateral pneumonia resolved, several new  patchy, ground-glass pulmonary opacities, no pulmonary embolus.  Abdomen/pelvis, no acute findings.   IMPRESSION:  1. Dyspnea on exertion.  2. Profound edema.  3. History of palpitations with premature atrial contractions, premature      ventricular contractions and non-sustained ventricular tachycardia.  4. Non-ischemic Cardiolite September, 2005.  5. __________  function.  6. Trace mitral regurgitation by last echocardiogram.  7. History of empyema status post left VATS and decortication. __________;      complicated by GI bleed.   PLAN:  The patient was also going to be examined by Dr. Riley Kill.  At this  point in time, we plan to further evaluate with an echocardiogram to  evaluate LV function and valvular status.  We will also check a BNP level.  We have asked her to double up on her Lasix to 40 mg a day.  We will also  get BMET  today.  Will add some potassium to her medical regimen as well.  We will get a urinalysis to rule out significant proteinuria.  Will discuss  with Dr. Delton Coombes the findings on her chest CT and get her back to see Dr.  Jens Som tomorrow.  ADDENDUM:  I spoke to Dr. Delton Coombes about Ms. Egnew over the phone today. He  looked  at her CT scan. He felt that the opacities that were noted were  nothing more than residual scar from her recent infection. He felt that  these were not indicative of pulmonary edema and there was no pneumonia  noted. He felt that the stability of this should be followed on serial  films. She would probably need a repeat scan in about 3 months. He agreed to  see her in followup in the office. We referred her to  Dr. Delton Coombes in the next  couple of weeks for followup.                                    ______________________________                                [name], PA-C                                  ______________________________                                Arturo Morton. Riley Kill, MD, [facility]   Job #:  [id number]  SW/MedQ  DD:  06/07/2006  DT:  06/08/2006  Job #:  [id number]   cc:   Otilio Connors. Gerri Spore, M.D.  Leslye Peer, MD

## 2011-01-27 NOTE — Consult Note (Signed)
[name], [name]             ACCOUNT NO.:  [id number]   MEDICAL RECORD NO.:  [id number]          PATIENT TYPE:  INP   LOCATION:  3304                         FACILITY:  [facility]   PHYSICIAN:  John C. Madilyn Fireman, M.D.    DATE OF BIRTH:  [birth year]   DATE OF CONSULTATION:  08/14/2005  DATE OF DISCHARGE:                                   CONSULTATION   REASON FOR CONSULTATION:  Anemia and dark heme-positive stool.   HISTORY OF ILLNESS:  Patient is a 70 year old white female admitted in early  November with pneumonia, empyema status post VATS procedure and having  persistent problems with fever and dyspnea since then which has precluded  discharge.  She came in with a hemoglobin of 13.4 which has dropped  gradually down in the 8-9 range.  In the last day she has had three to four  fairly formed, but dark bowel movements that have all tested heme-positive.  Her hemoglobin fell to 7.2 today with an MCV of 95.  Last BUN 11, creatinine  0.7.  The nurse that has seen her stools has not noticed any maroon or red  color to the stools.  The patient has had mild abdominal pain, but really  more abdominal tenderness from the drains that have been in.  She has no  prior history of peptic ulcer disease or GI bleeding.  She thinks she had a  sigmoidoscopy in the past, but never a colonoscopy.   PAST MEDICAL HISTORY:  1.  History of arrhythmias.  2.  History of aseptic necrosis.   MEDICATIONS ON ADMISSION:  Toprol XL, Fosamax.   ALLERGIES:  SULFA, DOXYCYCLINE.   SOCIAL HISTORY:  Patient smokes.  She occasionally drinks alcohol.   FAMILY HISTORY:  Mother had an MI at age 65 and had breast cancer.   PHYSICAL EXAMINATION:  GENERAL:  She is somewhat dyspneic at rest and  otherwise in no acute distress.  HEART:  Regular rate and rhythm without murmur.  LUNGS:  Clear.  ABDOMEN:  Soft, nondistended with normoactive bowel sounds.  No  hepatosplenomegaly, mass, or guarding.  There is slight epigastric  tenderness.   IMPRESSION:  Anemia, dark heme-positive stools with significant drop in  hemoglobin over the course of the two-week hospitalization.  Suspect stress  ulceration to upper GI tract.   PLAN:  Will continue Protonix.  Will transfuse to hemoglobin of 9.  Recheck  BUN/creatinine in the morning and when pulmonary situation optimal will  proceed with EGD, sooner if GI bleeding increases.           ______________________________  Everardo All Madilyn Fireman, M.D.     JCH/MEDQ  D:  08/14/2005  T:  08/15/2005  Job:  [id number]

## 2011-05-10 ENCOUNTER — Encounter: Payer: Self-pay | Admitting: Gastroenterology

## 2011-05-24 ENCOUNTER — Institutional Professional Consult (permissible substitution): Payer: Medicare Other | Admitting: Emergency Medicine

## 2011-06-05 ENCOUNTER — Encounter: Payer: Self-pay | Admitting: Gastroenterology

## 2011-06-05 ENCOUNTER — Ambulatory Visit (INDEPENDENT_AMBULATORY_CARE_PROVIDER_SITE_OTHER): Payer: Medicare Other | Admitting: Gastroenterology

## 2011-06-05 DIAGNOSIS — R112 Nausea with vomiting, unspecified: Secondary | ICD-10-CM

## 2011-06-05 DIAGNOSIS — R635 Abnormal weight gain: Secondary | ICD-10-CM

## 2011-06-05 DIAGNOSIS — R141 Gas pain: Secondary | ICD-10-CM

## 2011-06-05 DIAGNOSIS — R140 Abdominal distension (gaseous): Secondary | ICD-10-CM

## 2011-06-05 DIAGNOSIS — R143 Flatulence: Secondary | ICD-10-CM

## 2011-06-05 NOTE — Patient Instructions (Addendum)
Barium esophagram.  You may need EGD as well, depending on results of barium esophagram.  [facility] 06/07/11 9 am 845 arrival nothing to eat or drink after midnight You should change the way you are taking your antiacid medicine (omeprazole) so that you are taking it 20-30 minutes prior to a decent meal as that is the way the pill is designed to work most effectively. A copy of this information will be made available to Dr. Tanya Nones.

## 2011-06-05 NOTE — Progress Notes (Signed)
HPI: This is a    Very pleasant 70 year old woman who I am meeting for the first time today.    Has seen Dr. Madilyn Fireman,  Dr. Elnoria Howard as well (unsedated colonoscopy due to lung disease).  Had lung cancer, diagnosed 08/2009; treated with bilateral lung surgery.  Had Chemo last year.  Never radiation.  See's Dr. Okey Dupre at [facility] (CT this past July, no interval changes).  She is currently dealing with pneumonia.  Nausea/vomiting at times.  Also gaining weight.    Has tried carafate.  No colon cancer in family.  Had colonosocpy in 2007 with Madilyn Fireman (norma except for diverticulsos).  Has dysphagia periodically.  Will vomit easily.  Drinking coffee can cause problems.  Takes omeprazole in AM, doesn't really eat BF.  She does not get heartburn.  She has had gastric ulcers, diagnosed with bleeding in 2006. Followup endoscopy showed healing of those ulcers. She did have also some significant esophagitis. CLOtest and around that time was negative  Had upper endoscopy at [facility] in 2011.   Review of systems: Pertinent positive and negative review of systems were noted in the above HPI section.  All other review of systems was otherwise negative.   Past Medical History  Diagnosis Date  . Allergic rhinitis   . Anemia   . Diverticula of colon   . Gastric ulcer with hemorrhage   . Esophagitis   . Arrhythmia   . Aseptic necrosis   . Basal cell cancer   . Osteoporosis   . Hyperlipidemia   . Anxiety   . Depression   . Lung cancer   . COPD (chronic obstructive pulmonary disease)   . GERD (gastroesophageal reflux disease)   . Hypertension   . Kidney stones   . Pneumonia     Past Surgical History  Procedure Date  . Laparoscopic salpingoopherectomy      reports that she has quit smoking. She has never used smokeless tobacco. She reports that she drinks alcohol. She reports that she does not use illicit drugs.  family history includes Breast cancer in her maternal aunt; Luiz Blare' disease in her  daughter; Heart disease in her maternal aunt; and Hypertension in her mother.    Current Medications, Allergies were all reviewed with the patient via [facility] electronic medical record system.    Physical Exam: BP 116/70  Pulse 68  Ht 5\' 7"  (1.702 m)  Wt 173 lb (78.472 kg)  BMI 27.10 kg/m2 Constitutional: generally well-appearing Psychiatric: alert and oriented x3 Eyes: extraocular movements intact Mouth: oral pharynx moist, no lesions Neck: supple no lymphadenopathy Cardiovascular: heart regular rate and rhythm Lungs: clear to auscultation bilaterally Abdomen: soft, nontender, nondistended, no obvious ascites, no peritoneal signs, normal bowel sounds Extremities: no lower extremity edema bilaterally Skin: no lesions on visible extremities    Assessment and plan: 70 y.o. female with  swallowing difficulty, history of esophagitis, gastric ulcers, lung cancer  She has limited pulmonary reserve after bilateral lung cancer surgeries. She has had a pneumonia for at least a month and is still coughing from that now. I would like to assess her esophagus using barium esophagram first and then will make a decision on whether she may benefit from upper endoscopy with dilation. She is not taking proton pump inhibitor at the correct time in relation to food and so I educated her on that.

## 2011-06-07 ENCOUNTER — Encounter: Payer: Self-pay | Admitting: Emergency Medicine

## 2011-06-07 ENCOUNTER — Ambulatory Visit (HOSPITAL_COMMUNITY)
Admission: RE | Admit: 2011-06-07 | Discharge: 2011-06-07 | Disposition: A | Discharge status: Home / Self Care | Payer: Medicare Other | Source: Ambulatory Visit | Attending: Gastroenterology | Admitting: Gastroenterology

## 2011-06-07 DIAGNOSIS — K449 Diaphragmatic hernia without obstruction or gangrene: Secondary | ICD-10-CM | POA: Insufficient documentation

## 2011-06-07 DIAGNOSIS — R1310 Dysphagia, unspecified: Secondary | ICD-10-CM | POA: Insufficient documentation

## 2011-06-07 DIAGNOSIS — R140 Abdominal distension (gaseous): Secondary | ICD-10-CM

## 2011-06-07 DIAGNOSIS — Z85118 Personal history of other malignant neoplasm of bronchus and lung: Secondary | ICD-10-CM | POA: Insufficient documentation

## 2011-06-07 DIAGNOSIS — R635 Abnormal weight gain: Secondary | ICD-10-CM

## 2011-06-07 DIAGNOSIS — R079 Chest pain, unspecified: Secondary | ICD-10-CM | POA: Insufficient documentation

## 2011-06-07 DIAGNOSIS — R112 Nausea with vomiting, unspecified: Secondary | ICD-10-CM | POA: Insufficient documentation

## 2011-06-07 DIAGNOSIS — K219 Gastro-esophageal reflux disease without esophagitis: Secondary | ICD-10-CM | POA: Insufficient documentation

## 2011-06-12 ENCOUNTER — Institutional Professional Consult (permissible substitution): Payer: Medicare Other | Admitting: Emergency Medicine

## 2011-06-21 ENCOUNTER — Encounter: Payer: Self-pay | Admitting: Emergency Medicine

## 2011-06-21 ENCOUNTER — Ambulatory Visit (INDEPENDENT_AMBULATORY_CARE_PROVIDER_SITE_OTHER): Payer: Medicare Other | Admitting: Emergency Medicine

## 2011-06-21 VITALS — BP 140/78 | HR 69 | Temp 98.1°F | Ht 67.5 in | Wt 171.6 lb

## 2011-06-21 DIAGNOSIS — J4489 Other specified chronic obstructive pulmonary disease: Secondary | ICD-10-CM

## 2011-06-21 DIAGNOSIS — J449 Chronic obstructive pulmonary disease, unspecified: Secondary | ICD-10-CM

## 2011-06-21 NOTE — Assessment & Plan Note (Signed)
Recent exacerbation treated w Pred + avelox, now improved. Here to re-establish care - PFt at [facility] - ? Decrease scheduled regimen depending on the PFT

## 2011-06-21 NOTE — Progress Notes (Signed)
70 yo woman, seen by me in the past for mild AFL, abnormal CT scan. The abnormalities on CT were ultimately determined to be BAC - she underwent B resections and post-op chemo (2011). She presents now for pulm f/u. She is on Spiriva and Advair and feels that her breathing is stable. Also with UA irritation and nasal gtt.     Gen: Pleasant, well-nourished, in no distress,  normal affect  ENT: No lesions,  mouth clear,  oropharynx clear, no postnasal drip  Neck: No JVD, no TMG, no carotid bruits  Lungs: No use of accessory muscles, no dullness to percussion, clear without rales or rhonchi  Cardiovascular: RRR, heart sounds normal, no murmur or gallops, no peripheral edema  Abdomen: soft and NT, no HSM,  BS normal  Musculoskeletal: No deformities, no cyanosis or clubbing  Neuro: alert, non focal  Skin: Warm, no lesions or rashes    COPD, MILD Recent exacerbation treated w Pred + avelox, now improved. Here to re-establish care - PFt at [facility] - ? Decrease scheduled regimen depending on the PFT

## 2011-06-21 NOTE — Patient Instructions (Signed)
Continue your Spiriva and Advair  We will perform full PFT at your next office visit Follow with Dr Delton Coombes in 1 month

## 2011-07-11 ENCOUNTER — Ambulatory Visit: Payer: Medicare Other | Admitting: Gastroenterology

## 2011-07-25 ENCOUNTER — Ambulatory Visit: Payer: Medicare Other | Admitting: Thoracic Surgery

## 2011-07-28 ENCOUNTER — Other Ambulatory Visit: Payer: Self-pay | Admitting: Thoracic Surgery

## 2011-07-28 DIAGNOSIS — C3490 Malignant neoplasm of unspecified part of unspecified bronchus or lung: Secondary | ICD-10-CM

## 2011-07-31 ENCOUNTER — Encounter: Payer: Self-pay | Admitting: Thoracic Surgery

## 2011-08-02 ENCOUNTER — Ambulatory Visit
Admission: RE | Admit: 2011-08-02 | Discharge: 2011-08-02 | Disposition: A | Discharge status: Home / Self Care | Payer: Medicare Other | Source: Ambulatory Visit | Attending: Thoracic Surgery | Admitting: Thoracic Surgery

## 2011-08-02 ENCOUNTER — Ambulatory Visit: Payer: Medicare Other | Admitting: Thoracic Surgery

## 2011-08-02 ENCOUNTER — Ambulatory Visit (INDEPENDENT_AMBULATORY_CARE_PROVIDER_SITE_OTHER): Payer: Medicare Other | Admitting: Thoracic Surgery

## 2011-08-02 ENCOUNTER — Encounter: Payer: Self-pay | Admitting: Thoracic Surgery

## 2011-08-02 VITALS — BP 135/76 | HR 80 | Resp 18 | Ht 68.0 in

## 2011-08-02 DIAGNOSIS — C3410 Malignant neoplasm of upper lobe, unspecified bronchus or lung: Secondary | ICD-10-CM

## 2011-08-02 DIAGNOSIS — C3490 Malignant neoplasm of unspecified part of unspecified bronchus or lung: Secondary | ICD-10-CM

## 2011-08-02 NOTE — Progress Notes (Signed)
HPI the patient returns now over a year and a half since her surgeries. Chest x-ray shows scarring in no evidence of recurrence. She will get a CT scan in January with at [facility]. I will see her again in 6 months with another chest x-ray. Her lung status is stable. She is been traveling to [place] and [place]. Current Outpatient Prescriptions  Medication Sig Dispense Refill  . aspirin 81 MG tablet Take 81 mg by mouth every other day.       . b complex-C-folic acid 1 MG capsule Take 1 capsule by mouth daily.        . benazepril (LOTENSIN) 20 MG tablet Take 20 mg by mouth daily.        . Fluticasone-Salmeterol (ADVAIR DISKUS) 250-50 MCG/DOSE AEPB Inhale 1 puff into the lungs every 12 (twelve) hours.        Marland Kitchen levothyroxine (SYNTHROID, LEVOTHROID) 100 MCG tablet Take 100 mcg by mouth daily.        . mometasone (NASONEX) 50 MCG/ACT nasal spray Place 2 sprays into the nose daily.        . Multiple Vitamins-Minerals (MULTIVITAMIN WITH MINERALS) tablet Take 1 tablet by mouth daily.        Marland Kitchen omeprazole (PRILOSEC) 20 MG capsule Take 20 mg by mouth daily.        . sertraline (ZOLOFT) 50 MG tablet Take 100 mg by mouth daily.       . simvastatin (ZOCOR) 80 MG tablet Take 80 mg by mouth at bedtime.        Marland Kitchen tiotropium (SPIRIVA) 18 MCG inhalation capsule Place 18 mcg into inhaler and inhale daily.        . traZODone (DESYREL) 50 MG tablet Take 50 mg by mouth at bedtime.           Review of Systems: Unchanged  Physical Exam lungs are clear to auscultation percussion   Diagnostic Tests: Chest x-ray showed normal postoperative changes with scarring no recurrence   Impression: Status post bilateral thoracotomies  for adenocarcinoma status post chemotherapy   Plan:

## 2011-08-04 ENCOUNTER — Ambulatory Visit: Payer: Medicare Other | Admitting: Emergency Medicine

## 2011-08-07 ENCOUNTER — Ambulatory Visit (INDEPENDENT_AMBULATORY_CARE_PROVIDER_SITE_OTHER): Payer: Medicare Other | Admitting: Emergency Medicine

## 2011-08-07 DIAGNOSIS — J449 Chronic obstructive pulmonary disease, unspecified: Secondary | ICD-10-CM

## 2011-08-07 LAB — PULMONARY FUNCTION TEST

## 2011-08-07 NOTE — Progress Notes (Signed)
PFT done today. 

## 2011-08-24 ENCOUNTER — Ambulatory Visit: Payer: Medicare Other | Admitting: Emergency Medicine

## 2011-08-31 ENCOUNTER — Other Ambulatory Visit: Payer: Self-pay | Admitting: *Deleted

## 2011-08-31 ENCOUNTER — Ambulatory Visit (INDEPENDENT_AMBULATORY_CARE_PROVIDER_SITE_OTHER): Payer: Medicare Other | Admitting: Emergency Medicine

## 2011-08-31 ENCOUNTER — Encounter: Payer: Self-pay | Admitting: Emergency Medicine

## 2011-08-31 DIAGNOSIS — J449 Chronic obstructive pulmonary disease, unspecified: Secondary | ICD-10-CM

## 2011-08-31 DIAGNOSIS — C3490 Malignant neoplasm of unspecified part of unspecified bronchus or lung: Secondary | ICD-10-CM

## 2011-08-31 DIAGNOSIS — J309 Allergic rhinitis, unspecified: Secondary | ICD-10-CM

## 2011-08-31 DIAGNOSIS — J4489 Other specified chronic obstructive pulmonary disease: Secondary | ICD-10-CM

## 2011-08-31 MED ORDER — LORATADINE 10 MG PO TABS: 10.0000 mg | ORAL_TABLET | Freq: Every day | ORAL | Status: DC

## 2011-08-31 NOTE — Patient Instructions (Signed)
Stop Advair Continue your Spiriva daily Continue nasonex  Start loratadine 10mg  daily Follow with Dr Delton Coombes in 6 months or sooner if you have any problems.

## 2011-08-31 NOTE — Assessment & Plan Note (Signed)
On benazepril, has irritated UA, still w pnd - stop the advair - continue nasonex - add back loratadine

## 2011-08-31 NOTE — Assessment & Plan Note (Signed)
Reassuring PFT - show mild AFL, stable - stop Advair - continue the spiriva - rov 6 months

## 2011-08-31 NOTE — Progress Notes (Signed)
70 yo woman, seen by me in the past for mild AFL, abnormal CT scan. The abnormalities on CT were ultimately determined to be BAC - she underwent B resections and post-op chemo (2011). She presents now for pulm f/u. She is on Spiriva and Advair and feels that her breathing is stable. Also with UA irritation and nasal gtt.   ROV 08/31/11 -- f/u visit fo rmild COPD, also hx BAC s/p surgical rx + chemo. She is having some throat irritation, PND on nasonex. She is taking Advair in the am only, takes spiriva in the am.   PULMONARY FUNCTON TEST 08/31/2011  FVC 3.16  FEV1 2.16  FEV1/FVC 68.4  FVC  % Predicted 96  FEV % Predicted 90  FeF 25-75 1.25  FeF 25-75 % Predicted 2.56    Gen: Pleasant, well-nourished, in no distress,  normal affect  ENT: No lesions,  mouth clear,  oropharynx clear, no postnasal drip  Neck: No JVD, no TMG, no carotid bruits  Lungs: No use of accessory muscles, no dullness to percussion, clear without rales or rhonchi  Cardiovascular: RRR, heart sounds normal, no murmur or gallops, no peripheral edema  Abdomen: soft and NT, no HSM,  BS normal  Musculoskeletal: No deformities, no cyanosis or clubbing  Neuro: alert, non focal  Skin: Warm, no lesions or rashes   ALLERGIC RHINITIS On benazepril, has irritated UA, still w pnd - stop the advair - continue nasonex - add back loratadine  COPD, MILD Reassuring PFT - show mild AFL, stable - stop Advair - continue the spiriva - rov 6 months  Lung cancer Following at [facility] and w Dr Edwyna Shell

## 2011-08-31 NOTE — Assessment & Plan Note (Signed)
Following at [facility] and w Dr Edwyna Shell

## 2011-09-13 DIAGNOSIS — H2510 Age-related nuclear cataract, unspecified eye: Secondary | ICD-10-CM | POA: Diagnosis not present

## 2011-09-13 DIAGNOSIS — IMO0002 Reserved for concepts with insufficient information to code with codable children: Secondary | ICD-10-CM | POA: Diagnosis not present

## 2011-09-21 DIAGNOSIS — H538 Other visual disturbances: Secondary | ICD-10-CM | POA: Diagnosis not present

## 2011-09-21 DIAGNOSIS — H524 Presbyopia: Secondary | ICD-10-CM | POA: Diagnosis not present

## 2011-09-21 DIAGNOSIS — H53149 Visual discomfort, unspecified: Secondary | ICD-10-CM | POA: Diagnosis not present

## 2011-09-21 DIAGNOSIS — H43819 Vitreous degeneration, unspecified eye: Secondary | ICD-10-CM | POA: Diagnosis not present

## 2011-09-26 DIAGNOSIS — R911 Solitary pulmonary nodule: Secondary | ICD-10-CM | POA: Diagnosis not present

## 2011-09-26 DIAGNOSIS — C3490 Malignant neoplasm of unspecified part of unspecified bronchus or lung: Secondary | ICD-10-CM | POA: Diagnosis not present

## 2011-10-05 DIAGNOSIS — H04219 Epiphora due to excess lacrimation, unspecified lacrimal gland: Secondary | ICD-10-CM | POA: Diagnosis not present

## 2011-10-05 DIAGNOSIS — H2000 Unspecified acute and subacute iridocyclitis: Secondary | ICD-10-CM | POA: Diagnosis not present

## 2011-10-05 DIAGNOSIS — L719 Rosacea, unspecified: Secondary | ICD-10-CM | POA: Diagnosis not present

## 2011-10-05 DIAGNOSIS — H53149 Visual discomfort, unspecified: Secondary | ICD-10-CM | POA: Diagnosis not present

## 2011-10-12 ENCOUNTER — Other Ambulatory Visit: Payer: Self-pay | Admitting: Family Medicine

## 2011-10-12 DIAGNOSIS — Z1231 Encounter for screening mammogram for malignant neoplasm of breast: Secondary | ICD-10-CM

## 2011-10-19 DIAGNOSIS — L719 Rosacea, unspecified: Secondary | ICD-10-CM | POA: Diagnosis not present

## 2011-10-19 DIAGNOSIS — H20029 Recurrent acute iridocyclitis, unspecified eye: Secondary | ICD-10-CM | POA: Diagnosis not present

## 2011-10-24 DIAGNOSIS — M9981 Other biomechanical lesions of cervical region: Secondary | ICD-10-CM | POA: Diagnosis not present

## 2011-10-24 DIAGNOSIS — M542 Cervicalgia: Secondary | ICD-10-CM | POA: Diagnosis not present

## 2011-10-25 DIAGNOSIS — M9981 Other biomechanical lesions of cervical region: Secondary | ICD-10-CM | POA: Diagnosis not present

## 2011-10-25 DIAGNOSIS — M999 Biomechanical lesion, unspecified: Secondary | ICD-10-CM | POA: Diagnosis not present

## 2011-10-25 DIAGNOSIS — M542 Cervicalgia: Secondary | ICD-10-CM | POA: Diagnosis not present

## 2011-10-25 DIAGNOSIS — M545 Low back pain, unspecified: Secondary | ICD-10-CM | POA: Diagnosis not present

## 2011-10-26 ENCOUNTER — Ambulatory Visit: Payer: Medicare Other

## 2011-10-26 DIAGNOSIS — M545 Low back pain, unspecified: Secondary | ICD-10-CM | POA: Diagnosis not present

## 2011-10-26 DIAGNOSIS — M542 Cervicalgia: Secondary | ICD-10-CM | POA: Diagnosis not present

## 2011-10-26 DIAGNOSIS — M9981 Other biomechanical lesions of cervical region: Secondary | ICD-10-CM | POA: Diagnosis not present

## 2011-10-26 DIAGNOSIS — M999 Biomechanical lesion, unspecified: Secondary | ICD-10-CM | POA: Diagnosis not present

## 2011-11-06 DIAGNOSIS — R319 Hematuria, unspecified: Secondary | ICD-10-CM | POA: Diagnosis not present

## 2011-11-06 DIAGNOSIS — N390 Urinary tract infection, site not specified: Secondary | ICD-10-CM | POA: Diagnosis not present

## 2011-11-06 DIAGNOSIS — L940 Localized scleroderma [morphea]: Secondary | ICD-10-CM | POA: Diagnosis not present

## 2011-11-16 ENCOUNTER — Ambulatory Visit: Payer: Medicare Other

## 2011-11-21 DIAGNOSIS — Z961 Presence of intraocular lens: Secondary | ICD-10-CM | POA: Diagnosis not present

## 2011-11-21 DIAGNOSIS — H2010 Chronic iridocyclitis, unspecified eye: Secondary | ICD-10-CM | POA: Diagnosis not present

## 2011-11-22 DIAGNOSIS — J40 Bronchitis, not specified as acute or chronic: Secondary | ICD-10-CM | POA: Diagnosis not present

## 2011-11-30 DIAGNOSIS — F329 Major depressive disorder, single episode, unspecified: Secondary | ICD-10-CM | POA: Diagnosis not present

## 2011-11-30 DIAGNOSIS — J069 Acute upper respiratory infection, unspecified: Secondary | ICD-10-CM | POA: Diagnosis not present

## 2011-12-04 DIAGNOSIS — F331 Major depressive disorder, recurrent, moderate: Secondary | ICD-10-CM | POA: Diagnosis not present

## 2011-12-20 DIAGNOSIS — F331 Major depressive disorder, recurrent, moderate: Secondary | ICD-10-CM | POA: Diagnosis not present

## 2011-12-22 DIAGNOSIS — J449 Chronic obstructive pulmonary disease, unspecified: Secondary | ICD-10-CM | POA: Diagnosis not present

## 2011-12-22 DIAGNOSIS — M1990 Unspecified osteoarthritis, unspecified site: Secondary | ICD-10-CM | POA: Diagnosis not present

## 2012-01-02 DIAGNOSIS — F331 Major depressive disorder, recurrent, moderate: Secondary | ICD-10-CM | POA: Diagnosis not present

## 2012-01-10 DIAGNOSIS — F331 Major depressive disorder, recurrent, moderate: Secondary | ICD-10-CM | POA: Diagnosis not present

## 2012-01-26 ENCOUNTER — Other Ambulatory Visit: Payer: Self-pay | Admitting: Thoracic Surgery

## 2012-01-26 DIAGNOSIS — C3410 Malignant neoplasm of upper lobe, unspecified bronchus or lung: Secondary | ICD-10-CM

## 2012-01-30 ENCOUNTER — Ambulatory Visit: Payer: Medicare Other | Admitting: Thoracic Surgery

## 2012-01-31 ENCOUNTER — Ambulatory Visit: Payer: Medicare Other | Admitting: Thoracic Surgery

## 2012-01-31 ENCOUNTER — Encounter: Payer: Self-pay | Admitting: Thoracic Surgery

## 2012-01-31 ENCOUNTER — Ambulatory Visit
Admission: RE | Admit: 2012-01-31 | Discharge: 2012-01-31 | Disposition: A | Discharge status: Home / Self Care | Payer: Medicare Other | Source: Ambulatory Visit | Attending: Thoracic Surgery | Admitting: Thoracic Surgery

## 2012-01-31 ENCOUNTER — Ambulatory Visit (INDEPENDENT_AMBULATORY_CARE_PROVIDER_SITE_OTHER): Payer: Medicare Other | Admitting: Thoracic Surgery

## 2012-01-31 VITALS — BP 129/86 | HR 75 | Resp 16 | Ht 68.0 in | Wt 135.0 lb

## 2012-01-31 DIAGNOSIS — R911 Solitary pulmonary nodule: Secondary | ICD-10-CM | POA: Diagnosis not present

## 2012-01-31 DIAGNOSIS — Z85118 Personal history of other malignant neoplasm of bronchus and lung: Secondary | ICD-10-CM

## 2012-01-31 DIAGNOSIS — Z09 Encounter for follow-up examination after completed treatment for conditions other than malignant neoplasm: Secondary | ICD-10-CM

## 2012-01-31 DIAGNOSIS — C3410 Malignant neoplasm of upper lobe, unspecified bronchus or lung: Secondary | ICD-10-CM

## 2012-01-31 DIAGNOSIS — J984 Other disorders of lung: Secondary | ICD-10-CM | POA: Diagnosis not present

## 2012-01-31 DIAGNOSIS — C3490 Malignant neoplasm of unspecified part of unspecified bronchus or lung: Secondary | ICD-10-CM | POA: Diagnosis not present

## 2012-01-31 NOTE — Progress Notes (Signed)
HPI chest x-ray is stable discontinue followed by a [facility]. She'll get another CT scan in July. The lung nodule in the right side is unchanged. R. chest x-ray today showed no change. She will be followed  At [facility]. We will rehabilitation to see her again if she has any future problems. I gave the name of or thoracic surgeons at [facility]. She knows that I would be retiring.   Current Outpatient Prescriptions  Medication Sig Dispense Refill  . aspirin 81 MG tablet Take 81 mg by mouth every other day.       . b complex-C-folic acid 1 MG capsule Take 1 capsule by mouth daily.        Marland Kitchen levothyroxine (SYNTHROID, LEVOTHROID) 100 MCG tablet Take 100 mcg by mouth daily.        Marland Kitchen loratadine (CLARITIN) 10 MG tablet Take 1 tablet (10 mg total) by mouth daily.  90 tablet  3  . mometasone (NASONEX) 50 MCG/ACT nasal spray Place 2 sprays into the nose daily.        . Multiple Vitamins-Minerals (MULTIVITAMIN WITH MINERALS) tablet Take 1 tablet by mouth daily.        Marland Kitchen omeprazole (PRILOSEC) 20 MG capsule Take 20 mg by mouth daily.        . sertraline (ZOLOFT) 50 MG tablet Take 100 mg by mouth daily.       . simvastatin (ZOCOR) 80 MG tablet Take 80 mg by mouth at bedtime.        Marland Kitchen tiotropium (SPIRIVA) 18 MCG inhalation capsule Place 18 mcg into inhaler and inhale daily.        . traZODone (DESYREL) 50 MG tablet Take 50 mg by mouth at bedtime.           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

## 2012-02-01 DIAGNOSIS — F331 Major depressive disorder, recurrent, moderate: Secondary | ICD-10-CM | POA: Diagnosis not present

## 2012-03-26 DIAGNOSIS — F32A Depression, unspecified: Secondary | ICD-10-CM | POA: Insufficient documentation

## 2012-03-26 DIAGNOSIS — C3490 Malignant neoplasm of unspecified part of unspecified bronchus or lung: Secondary | ICD-10-CM | POA: Diagnosis not present

## 2012-03-26 DIAGNOSIS — R918 Other nonspecific abnormal finding of lung field: Secondary | ICD-10-CM | POA: Diagnosis not present

## 2012-03-26 DIAGNOSIS — F329 Major depressive disorder, single episode, unspecified: Secondary | ICD-10-CM | POA: Insufficient documentation

## 2012-03-26 DIAGNOSIS — I10 Essential (primary) hypertension: Secondary | ICD-10-CM | POA: Insufficient documentation

## 2012-03-26 DIAGNOSIS — K279 Peptic ulcer, site unspecified, unspecified as acute or chronic, without hemorrhage or perforation: Secondary | ICD-10-CM | POA: Insufficient documentation

## 2012-03-27 ENCOUNTER — Ambulatory Visit
Admission: RE | Admit: 2012-03-27 | Discharge: 2012-03-27 | Disposition: A | Discharge status: Home / Self Care | Payer: Medicare Other | Source: Ambulatory Visit | Attending: Family Medicine | Admitting: Family Medicine

## 2012-03-27 DIAGNOSIS — Z1231 Encounter for screening mammogram for malignant neoplasm of breast: Secondary | ICD-10-CM

## 2012-03-28 ENCOUNTER — Ambulatory Visit: Payer: Medicare Other | Admitting: Emergency Medicine

## 2012-05-29 DIAGNOSIS — L821 Other seborrheic keratosis: Secondary | ICD-10-CM | POA: Diagnosis not present

## 2012-05-29 DIAGNOSIS — L570 Actinic keratosis: Secondary | ICD-10-CM | POA: Diagnosis not present

## 2012-05-29 DIAGNOSIS — D239 Other benign neoplasm of skin, unspecified: Secondary | ICD-10-CM | POA: Diagnosis not present

## 2012-06-26 ENCOUNTER — Other Ambulatory Visit: Payer: Self-pay | Admitting: Family Medicine

## 2012-06-26 ENCOUNTER — Ambulatory Visit
Admission: RE | Admit: 2012-06-26 | Discharge: 2012-06-26 | Disposition: A | Discharge status: Home / Self Care | Payer: Medicare Other | Source: Ambulatory Visit | Attending: Family Medicine | Admitting: Family Medicine

## 2012-06-26 DIAGNOSIS — J984 Other disorders of lung: Secondary | ICD-10-CM | POA: Diagnosis not present

## 2012-06-26 DIAGNOSIS — R0602 Shortness of breath: Secondary | ICD-10-CM

## 2012-06-26 DIAGNOSIS — R059 Cough, unspecified: Secondary | ICD-10-CM | POA: Diagnosis not present

## 2012-06-26 DIAGNOSIS — R0989 Other specified symptoms and signs involving the circulatory and respiratory systems: Secondary | ICD-10-CM | POA: Diagnosis not present

## 2012-06-26 DIAGNOSIS — N390 Urinary tract infection, site not specified: Secondary | ICD-10-CM | POA: Diagnosis not present

## 2012-06-26 DIAGNOSIS — R05 Cough: Secondary | ICD-10-CM | POA: Diagnosis not present

## 2012-06-26 DIAGNOSIS — R0609 Other forms of dyspnea: Secondary | ICD-10-CM | POA: Diagnosis not present

## 2012-06-26 MED ORDER — IOHEXOL 350 MG/ML SOLN
100.0000 mL | Freq: Once | INTRAVENOUS | Status: AC | PRN
  Administered 2012-06-26: 100 mL via INTRAVENOUS

## 2012-07-03 DIAGNOSIS — Z23 Encounter for immunization: Secondary | ICD-10-CM | POA: Diagnosis not present

## 2012-07-21 ENCOUNTER — Other Ambulatory Visit: Payer: Self-pay | Admitting: Emergency Medicine

## 2012-07-27 DIAGNOSIS — S20219A Contusion of unspecified front wall of thorax, initial encounter: Secondary | ICD-10-CM | POA: Insufficient documentation

## 2012-08-05 DIAGNOSIS — S93499A Sprain of other ligament of unspecified ankle, initial encounter: Secondary | ICD-10-CM | POA: Diagnosis not present

## 2012-08-05 DIAGNOSIS — S96819A Strain of other specified muscles and tendons at ankle and foot level, unspecified foot, initial encounter: Secondary | ICD-10-CM | POA: Diagnosis not present

## 2012-08-20 DIAGNOSIS — M25539 Pain in unspecified wrist: Secondary | ICD-10-CM | POA: Diagnosis not present

## 2012-08-20 DIAGNOSIS — S93409A Sprain of unspecified ligament of unspecified ankle, initial encounter: Secondary | ICD-10-CM | POA: Diagnosis not present

## 2012-10-22 DIAGNOSIS — C3490 Malignant neoplasm of unspecified part of unspecified bronchus or lung: Secondary | ICD-10-CM | POA: Diagnosis not present

## 2012-10-22 DIAGNOSIS — I319 Disease of pericardium, unspecified: Secondary | ICD-10-CM | POA: Diagnosis not present

## 2012-10-22 DIAGNOSIS — Z8781 Personal history of (healed) traumatic fracture: Secondary | ICD-10-CM | POA: Diagnosis not present

## 2012-10-22 DIAGNOSIS — R918 Other nonspecific abnormal finding of lung field: Secondary | ICD-10-CM | POA: Diagnosis not present

## 2012-10-22 DIAGNOSIS — R911 Solitary pulmonary nodule: Secondary | ICD-10-CM | POA: Diagnosis not present

## 2012-10-23 DIAGNOSIS — F4320 Adjustment disorder, unspecified: Secondary | ICD-10-CM | POA: Diagnosis not present

## 2012-11-05 DIAGNOSIS — Z902 Acquired absence of lung [part of]: Secondary | ICD-10-CM | POA: Diagnosis not present

## 2012-11-05 DIAGNOSIS — Z79899 Other long term (current) drug therapy: Secondary | ICD-10-CM | POA: Diagnosis not present

## 2012-11-05 DIAGNOSIS — R911 Solitary pulmonary nodule: Secondary | ICD-10-CM | POA: Diagnosis not present

## 2012-11-05 DIAGNOSIS — C3490 Malignant neoplasm of unspecified part of unspecified bronchus or lung: Secondary | ICD-10-CM | POA: Diagnosis not present

## 2012-11-05 DIAGNOSIS — C3410 Malignant neoplasm of upper lobe, unspecified bronchus or lung: Secondary | ICD-10-CM | POA: Diagnosis not present

## 2012-11-05 DIAGNOSIS — R918 Other nonspecific abnormal finding of lung field: Secondary | ICD-10-CM | POA: Diagnosis not present

## 2012-11-08 DIAGNOSIS — C3490 Malignant neoplasm of unspecified part of unspecified bronchus or lung: Secondary | ICD-10-CM | POA: Diagnosis not present

## 2012-11-19 DIAGNOSIS — C3490 Malignant neoplasm of unspecified part of unspecified bronchus or lung: Secondary | ICD-10-CM | POA: Diagnosis not present

## 2012-11-20 DIAGNOSIS — Z510 Encounter for antineoplastic radiation therapy: Secondary | ICD-10-CM | POA: Diagnosis not present

## 2012-11-20 DIAGNOSIS — C3490 Malignant neoplasm of unspecified part of unspecified bronchus or lung: Secondary | ICD-10-CM | POA: Diagnosis not present

## 2012-11-22 DIAGNOSIS — Z510 Encounter for antineoplastic radiation therapy: Secondary | ICD-10-CM | POA: Diagnosis not present

## 2012-11-22 DIAGNOSIS — C3410 Malignant neoplasm of upper lobe, unspecified bronchus or lung: Secondary | ICD-10-CM | POA: Diagnosis not present

## 2012-11-26 DIAGNOSIS — C3410 Malignant neoplasm of upper lobe, unspecified bronchus or lung: Secondary | ICD-10-CM | POA: Diagnosis not present

## 2012-11-26 DIAGNOSIS — Z510 Encounter for antineoplastic radiation therapy: Secondary | ICD-10-CM | POA: Diagnosis not present

## 2012-11-28 DIAGNOSIS — Z510 Encounter for antineoplastic radiation therapy: Secondary | ICD-10-CM | POA: Diagnosis not present

## 2012-11-28 DIAGNOSIS — C3410 Malignant neoplasm of upper lobe, unspecified bronchus or lung: Secondary | ICD-10-CM | POA: Diagnosis not present

## 2012-12-10 ENCOUNTER — Other Ambulatory Visit: Payer: Self-pay | Admitting: Family Medicine

## 2012-12-17 ENCOUNTER — Telehealth: Payer: Self-pay | Admitting: Family Medicine

## 2012-12-17 MED ORDER — SIMVASTATIN 80 MG PO TABS: 80.0000 mg | ORAL_TABLET | Freq: Every day | ORAL | Status: DC

## 2012-12-17 MED ORDER — OMEPRAZOLE 20 MG PO CPDR: 20.0000 mg | DELAYED_RELEASE_CAPSULE | Freq: Every day | ORAL | Status: DC

## 2012-12-17 NOTE — Telephone Encounter (Signed)
rx refilled.

## 2012-12-21 ENCOUNTER — Emergency Department (HOSPITAL_COMMUNITY)
Admission: EM | Admit: 2012-12-21 | Discharge: 2012-12-21 | Disposition: A | Discharge status: Home / Self Care | Payer: Medicare Other | Attending: Emergency Medicine | Admitting: Emergency Medicine

## 2012-12-21 ENCOUNTER — Encounter (HOSPITAL_COMMUNITY): Payer: Self-pay | Admitting: *Deleted

## 2012-12-21 DIAGNOSIS — Z8679 Personal history of other diseases of the circulatory system: Secondary | ICD-10-CM | POA: Insufficient documentation

## 2012-12-21 DIAGNOSIS — E785 Hyperlipidemia, unspecified: Secondary | ICD-10-CM | POA: Diagnosis not present

## 2012-12-21 DIAGNOSIS — Z85118 Personal history of other malignant neoplasm of bronchus and lung: Secondary | ICD-10-CM | POA: Diagnosis not present

## 2012-12-21 DIAGNOSIS — Z8719 Personal history of other diseases of the digestive system: Secondary | ICD-10-CM | POA: Diagnosis not present

## 2012-12-21 DIAGNOSIS — Z87442 Personal history of urinary calculi: Secondary | ICD-10-CM | POA: Insufficient documentation

## 2012-12-21 DIAGNOSIS — Z85828 Personal history of other malignant neoplasm of skin: Secondary | ICD-10-CM | POA: Diagnosis not present

## 2012-12-21 DIAGNOSIS — Z8739 Personal history of other diseases of the musculoskeletal system and connective tissue: Secondary | ICD-10-CM | POA: Diagnosis not present

## 2012-12-21 DIAGNOSIS — Z8701 Personal history of pneumonia (recurrent): Secondary | ICD-10-CM | POA: Insufficient documentation

## 2012-12-21 DIAGNOSIS — Z791 Long term (current) use of non-steroidal anti-inflammatories (NSAID): Secondary | ICD-10-CM | POA: Diagnosis not present

## 2012-12-21 DIAGNOSIS — I10 Essential (primary) hypertension: Secondary | ICD-10-CM | POA: Insufficient documentation

## 2012-12-21 DIAGNOSIS — Z862 Personal history of diseases of the blood and blood-forming organs and certain disorders involving the immune mechanism: Secondary | ICD-10-CM | POA: Insufficient documentation

## 2012-12-21 DIAGNOSIS — M810 Age-related osteoporosis without current pathological fracture: Secondary | ICD-10-CM | POA: Diagnosis not present

## 2012-12-21 DIAGNOSIS — K219 Gastro-esophageal reflux disease without esophagitis: Secondary | ICD-10-CM | POA: Diagnosis not present

## 2012-12-21 DIAGNOSIS — F329 Major depressive disorder, single episode, unspecified: Secondary | ICD-10-CM | POA: Insufficient documentation

## 2012-12-21 DIAGNOSIS — Z8711 Personal history of peptic ulcer disease: Secondary | ICD-10-CM | POA: Diagnosis not present

## 2012-12-21 DIAGNOSIS — Z23 Encounter for immunization: Secondary | ICD-10-CM | POA: Insufficient documentation

## 2012-12-21 DIAGNOSIS — F3289 Other specified depressive episodes: Secondary | ICD-10-CM | POA: Insufficient documentation

## 2012-12-21 DIAGNOSIS — Z79899 Other long term (current) drug therapy: Secondary | ICD-10-CM | POA: Diagnosis not present

## 2012-12-21 DIAGNOSIS — F411 Generalized anxiety disorder: Secondary | ICD-10-CM | POA: Diagnosis not present

## 2012-12-21 DIAGNOSIS — Z87891 Personal history of nicotine dependence: Secondary | ICD-10-CM | POA: Insufficient documentation

## 2012-12-21 DIAGNOSIS — Y9389 Activity, other specified: Secondary | ICD-10-CM | POA: Insufficient documentation

## 2012-12-21 DIAGNOSIS — S61209A Unspecified open wound of unspecified finger without damage to nail, initial encounter: Secondary | ICD-10-CM | POA: Diagnosis not present

## 2012-12-21 DIAGNOSIS — Z7982 Long term (current) use of aspirin: Secondary | ICD-10-CM | POA: Insufficient documentation

## 2012-12-21 DIAGNOSIS — S61012A Laceration without foreign body of left thumb without damage to nail, initial encounter: Secondary | ICD-10-CM

## 2012-12-21 DIAGNOSIS — J449 Chronic obstructive pulmonary disease, unspecified: Secondary | ICD-10-CM | POA: Diagnosis not present

## 2012-12-21 DIAGNOSIS — J4489 Other specified chronic obstructive pulmonary disease: Secondary | ICD-10-CM | POA: Insufficient documentation

## 2012-12-21 DIAGNOSIS — W260XXA Contact with knife, initial encounter: Secondary | ICD-10-CM | POA: Insufficient documentation

## 2012-12-21 DIAGNOSIS — Y9289 Other specified places as the place of occurrence of the external cause: Secondary | ICD-10-CM | POA: Insufficient documentation

## 2012-12-21 MED ORDER — HYDROCODONE-ACETAMINOPHEN 5-325 MG PO TABS: 1.0000 | ORAL_TABLET | Freq: Four times a day (QID) | ORAL | Status: DC | PRN

## 2012-12-21 MED ORDER — LIDOCAINE HCL 2 % IJ SOLN
10.0000 mL | Freq: Once | INTRAMUSCULAR | Status: AC
  Administered 2012-12-21: 200 mg via INTRADERMAL

## 2012-12-21 MED ORDER — TETANUS-DIPHTH-ACELL PERTUSSIS 5-2.5-18.5 LF-MCG/0.5 IM SUSP
0.5000 mL | Freq: Once | INTRAMUSCULAR | Status: AC
  Administered 2012-12-21: 0.5 mL via INTRAMUSCULAR
  Filled 2012-12-21: qty 0.5

## 2012-12-21 MED ORDER — CEPHALEXIN 500 MG PO CAPS: 500.0000 mg | ORAL_CAPSULE | Freq: Four times a day (QID) | ORAL | Status: DC

## 2012-12-21 NOTE — ED Notes (Signed)
Discharge instructions reviewed. Pt verbalized understanding.  

## 2012-12-21 NOTE — ED Notes (Signed)
Pt reports cutting left thumb with razor knife, one inch laceration noted, bleeding controlled.

## 2012-12-21 NOTE — ED Provider Notes (Signed)
History     CSN: [id number]  Arrival date & time 12/21/12  1818   First MD Initiated Contact with Patient 12/21/12 1852      Chief Complaint  Patient presents with  . Laceration    (Consider location/radiation/quality/duration/timing/severity/associated sxs/prior treatment) HPI  72 year old female presents for evaluations of finger laceration. Patient reports she was helping her husband placing tiles on her bathroom when she accidentally cut her left thumb with a razor knife. Incident happened one hour ago. She reports acute onset of sharp pain to the affected area. She noticed immediate bleeding however bleeding is controlled. She has been applied pressure to the affected area. She denies any other injury. Pt denies numbness but sts she cannot straighten her thumb.    Past Medical History  Diagnosis Date  . Allergic rhinitis   . Anemia   . Diverticula of colon   . Gastric ulcer with hemorrhage   . Esophagitis   . Arrhythmia   . Aseptic necrosis   . Basal cell cancer   . Osteoporosis   . Hyperlipidemia   . Anxiety   . Depression   . Lung cancer   . COPD (chronic obstructive pulmonary disease)   . GERD (gastroesophageal reflux disease)   . Hypertension   . Kidney stones   . Pneumonia     Past Surgical History  Procedure Laterality Date  . Laparoscopic salpingoopherectomy    . Thoracotomy    . Mitral valve replacement      Family History  Problem Relation Age of Onset  . Graves' disease Daughter   . Breast cancer Maternal Aunt   . Heart disease Maternal Aunt   . Hypertension Mother   . Arthritis Maternal Grandmother     History  Substance Use Topics  . Smoking status: Former Smoker -- 0.50 packs/day for 25 years    Types: Cigarettes    Quit date: 09/11/2004  . Smokeless tobacco: Never Used  . Alcohol Use: Yes     Comment: social    OB History   Grav Para Term Preterm Abortions TAB SAB Ect Mult Living                  Review of Systems   Constitutional: Negative for fever.  Skin: Positive for wound. Negative for rash.  Neurological: Negative for numbness.    Allergies  Doxycycline; Erythromycin; and Sulfonamide derivatives  Home Medications   Current Outpatient Rx  Name  Route  Sig  Dispense  Refill  . ALLERGY 10 MG tablet      TAKE 1 TABLET BY MOUTH DAILY   90 tablet   0   . ALPRAZolam (XANAX) 0.5 MG tablet   Oral   Take 0.5 mg by mouth daily as needed for sleep.          Marland Kitchen aspirin 81 MG tablet   Oral   Take 81 mg by mouth every other day.          . b complex-C-folic acid 1 MG capsule   Oral   Take 1 capsule by mouth daily.           . DULoxetine (CYMBALTA) 60 MG capsule   Oral   Take 60 mg by mouth daily.         Marland Kitchen levothyroxine (SYNTHROID, LEVOTHROID) 100 MCG tablet   Oral   Take 100 mcg by mouth daily.           Marland Kitchen lisinopril-hydrochlorothiazide (PRINZIDE,ZESTORETIC) 10-12.5 MG per tablet  TAKE 1 TABLET BY MOUTH EVERY DAY   90 tablet   1   . mometasone (NASONEX) 50 MCG/ACT nasal spray   Nasal   Place 2 sprays into the nose daily as needed (allergy congestion).          . naproxen sodium (ANAPROX) 220 MG tablet   Oral   Take 220 mg by mouth 2 (two) times daily with a meal.         . omeprazole (PRILOSEC) 20 MG capsule   Oral   Take 1 capsule (20 mg total) by mouth daily.   90 capsule   4   . simvastatin (ZOCOR) 80 MG tablet   Oral   Take 1 tablet (80 mg total) by mouth at bedtime.   90 tablet   4   . tiotropium (SPIRIVA) 18 MCG inhalation capsule   Inhalation   Place 18 mcg into inhaler and inhale daily.           . traZODone (DESYREL) 50 MG tablet   Oral   Take 50 mg by mouth at bedtime as needed for sleep.          Marland Kitchen VITAMIN D, CHOLECALCIFEROL, PO   Oral   Take 1 tablet by mouth daily.           BP 135/98  Pulse 93  Temp(Src) 98.2 F (36.8 C) (Oral)  Resp 18  SpO2 98%  Physical Exam  Nursing note and vitals reviewed. Constitutional:  She appears well-developed and well-nourished. No distress.  HENT:  Head: Atraumatic.  Eyes: Conjunctivae are normal.  Neck: Neck supple.  Musculoskeletal: Normal range of motion. She exhibits tenderness (L thumb: 1cm oblique laceration to dorsum of finger distal to MCP with suspect tendon laceration.  normal flexion, unable to fully extend thumb.  brisk cap refill, sensastion intact.  ).  Neurological: She is alert.  Skin: Skin is warm.  Psychiatric: She has a normal mood and affect.    ED Course  Procedures (including critical care time)  LACERATION REPAIR Performed by: Fayrene Helper Authorized by: Fayrene Helper Consent: Verbal consent obtained. Risks and benefits: risks, benefits and alternatives were discussed Consent given by: patient Patient identity confirmed: provided demographic data Prepped and Draped in normal sterile fashion Wound explored  Laceration Location: L thumb  Laceration Length: 2cm  No Foreign Bodies seen or palpated, partial laceration of extensor pollicis longus tendon  Anesthesia: local infiltration  Local anesthetic: lidocaine 2% w/o epinephrine  Anesthetic total: 2 ml  Irrigation method: syringe Amount of cleaning: standard  Skin closure: prolene 5.0  Number of sutures: 5  Technique: simple interrupted  Patient tolerance: Patient tolerated the procedure well with no immediate complications.   7:06 PM Pt with laceration to her L thumb distal to MCP.  No joint involvement but suspect tendon injury as pt unable to fully extending her thumb about her PIP joint.  No fb seen.  Neurovascularly intact.  tdap given.  7:41 PM Patient appears to have a postural laceration to the extensor pollicis longus tendon on the left thumb. Laceration was repaired by me. Will consult with hand specialist for outpatient care.  7:48 PM I have consulted with Dr. Izora Ribas, hand specialist, who agrees to have pt f/u outpt as needed.  Will place pt on finger splint, abx,  and pain medication.  Care instruction given, return precaution given.  Pt voice understanding and agrees with plan. Care discussed with attending.   Labs Reviewed - No data to display No results  found.   1. Laceration of left thumb with tendon involvement, initial encounter       MDM  BP 135/98  Pulse 93  Temp(Src) 98.2 F (36.8 C) (Oral)  Resp 18  SpO2 98%         Fayrene Helper, PA-C 12/21/12 1953

## 2012-12-21 NOTE — ED Notes (Signed)
Pt cut left thumb while laying tile on floor. Pt denies any pain. Laceration about 1 inch long. Bowie PA at bedside. Wound care performed.

## 2012-12-22 NOTE — ED Provider Notes (Signed)
Medical screening examination/treatment/procedure(s) were conducted as a shared visit with non-physician practitioner(s) and myself.  I personally evaluated the patient during the encounter.  Left thumb lac c tendon involvement.  Primary closure by PA.  F/u with hand surgeon  Donnetta Hutching, MD 12/22/12 [phone]

## 2013-01-02 DIAGNOSIS — S61409A Unspecified open wound of unspecified hand, initial encounter: Secondary | ICD-10-CM | POA: Diagnosis not present

## 2013-01-03 ENCOUNTER — Telehealth: Payer: Self-pay | Admitting: Family Medicine

## 2013-01-03 NOTE — Telephone Encounter (Signed)
..  Patient aware per vm that the only way to be sure is to do BW for titers. Also informed that insurance may or may not cover it.

## 2013-01-06 DIAGNOSIS — S61409A Unspecified open wound of unspecified hand, initial encounter: Secondary | ICD-10-CM | POA: Diagnosis not present

## 2013-01-06 DIAGNOSIS — F329 Major depressive disorder, single episode, unspecified: Secondary | ICD-10-CM | POA: Diagnosis not present

## 2013-01-06 DIAGNOSIS — I10 Essential (primary) hypertension: Secondary | ICD-10-CM | POA: Diagnosis not present

## 2013-01-06 DIAGNOSIS — K279 Peptic ulcer, site unspecified, unspecified as acute or chronic, without hemorrhage or perforation: Secondary | ICD-10-CM | POA: Diagnosis not present

## 2013-01-06 DIAGNOSIS — E079 Disorder of thyroid, unspecified: Secondary | ICD-10-CM | POA: Diagnosis not present

## 2013-01-06 DIAGNOSIS — C3490 Malignant neoplasm of unspecified part of unspecified bronchus or lung: Secondary | ICD-10-CM | POA: Diagnosis not present

## 2013-01-08 DIAGNOSIS — S81809A Unspecified open wound, unspecified lower leg, initial encounter: Secondary | ICD-10-CM | POA: Diagnosis not present

## 2013-01-08 DIAGNOSIS — S61209A Unspecified open wound of unspecified finger without damage to nail, initial encounter: Secondary | ICD-10-CM | POA: Diagnosis not present

## 2013-01-08 DIAGNOSIS — S86909A Unspecified injury of unspecified muscle(s) and tendon(s) at lower leg level, unspecified leg, initial encounter: Secondary | ICD-10-CM | POA: Diagnosis not present

## 2013-01-08 DIAGNOSIS — W278XXA Contact with other nonpowered hand tool, initial encounter: Secondary | ICD-10-CM | POA: Diagnosis not present

## 2013-01-08 DIAGNOSIS — Y92009 Unspecified place in unspecified non-institutional (private) residence as the place of occurrence of the external cause: Secondary | ICD-10-CM | POA: Diagnosis not present

## 2013-01-08 DIAGNOSIS — S61409A Unspecified open wound of unspecified hand, initial encounter: Secondary | ICD-10-CM | POA: Diagnosis not present

## 2013-01-09 DIAGNOSIS — H11159 Pinguecula, unspecified eye: Secondary | ICD-10-CM | POA: Diagnosis not present

## 2013-01-09 DIAGNOSIS — H26499 Other secondary cataract, unspecified eye: Secondary | ICD-10-CM | POA: Diagnosis not present

## 2013-01-09 DIAGNOSIS — L719 Rosacea, unspecified: Secondary | ICD-10-CM | POA: Diagnosis not present

## 2013-01-09 DIAGNOSIS — H35039 Hypertensive retinopathy, unspecified eye: Secondary | ICD-10-CM | POA: Diagnosis not present

## 2013-01-09 DIAGNOSIS — H04129 Dry eye syndrome of unspecified lacrimal gland: Secondary | ICD-10-CM | POA: Diagnosis not present

## 2013-01-09 DIAGNOSIS — H40019 Open angle with borderline findings, low risk, unspecified eye: Secondary | ICD-10-CM | POA: Diagnosis not present

## 2013-01-09 DIAGNOSIS — Z961 Presence of intraocular lens: Secondary | ICD-10-CM | POA: Diagnosis not present

## 2013-01-15 ENCOUNTER — Other Ambulatory Visit: Payer: Self-pay | Admitting: Family Medicine

## 2013-01-21 DIAGNOSIS — Z5189 Encounter for other specified aftercare: Secondary | ICD-10-CM | POA: Diagnosis not present

## 2013-02-07 ENCOUNTER — Encounter: Payer: Self-pay | Admitting: Family Medicine

## 2013-02-07 ENCOUNTER — Ambulatory Visit (INDEPENDENT_AMBULATORY_CARE_PROVIDER_SITE_OTHER): Payer: Medicare Other | Admitting: Family Medicine

## 2013-02-07 VITALS — BP 126/74 | HR 84 | Temp 98.2°F | Resp 20 | Wt 173.0 lb

## 2013-02-07 DIAGNOSIS — F329 Major depressive disorder, single episode, unspecified: Secondary | ICD-10-CM | POA: Diagnosis not present

## 2013-02-07 DIAGNOSIS — M542 Cervicalgia: Secondary | ICD-10-CM | POA: Diagnosis not present

## 2013-02-07 DIAGNOSIS — F32A Depression, unspecified: Secondary | ICD-10-CM

## 2013-02-07 MED ORDER — BUPROPION HCL ER (XL) 150 MG PO TB24: 150.0000 mg | ORAL_TABLET | Freq: Every day | ORAL | Status: DC
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
## 2013-02-14 ENCOUNTER — Ambulatory Visit (INDEPENDENT_AMBULATORY_CARE_PROVIDER_SITE_OTHER): Payer: Medicare Other | Admitting: Family Medicine

## 2013-02-14 VITALS — BP 120/68 | HR 72 | Temp 97.5°F | Resp 18 | Ht 67.5 in | Wt 178.0 lb

## 2013-02-14 DIAGNOSIS — H00013 Hordeolum externum right eye, unspecified eyelid: Secondary | ICD-10-CM

## 2013-02-14 DIAGNOSIS — E039 Hypothyroidism, unspecified: Secondary | ICD-10-CM

## 2013-02-14 DIAGNOSIS — R5383 Other fatigue: Secondary | ICD-10-CM | POA: Diagnosis not present

## 2013-02-14 DIAGNOSIS — M542 Cervicalgia: Secondary | ICD-10-CM

## 2013-02-14 DIAGNOSIS — R5381 Other malaise: Secondary | ICD-10-CM | POA: Diagnosis not present

## 2013-02-14 DIAGNOSIS — H00019 Hordeolum externum unspecified eye, unspecified eyelid: Secondary | ICD-10-CM | POA: Diagnosis not present

## 2013-02-14 MED ORDER — CYCLOBENZAPRINE HCL 10 MG PO TABS: 10.0000 mg | ORAL_TABLET | Freq: Every evening | ORAL | Status: DC | PRN

## 2013-02-14 MED ORDER — ALPRAZOLAM 0.5 MG PO TABS: 0.5000 mg | ORAL_TABLET | Freq: Every day | ORAL | Status: DC | PRN

## 2013-02-14 MED ORDER — CELECOXIB 100 MG PO CAPS: 100.0000 mg | ORAL_CAPSULE | Freq: Two times a day (BID) | ORAL | Status: DC

## 2013-02-14 NOTE — Progress Notes (Signed)
  Subjective:    Patient ID: [name], female    DOB: [birth year], 72 y.o.   MRN: [id number]  HPI Patient presented with complaint of fatigue and feeling like her immune system was down. Over the weekend she had a sty in her right eye which started to drain on its own. She denies any pain currently and I are any recent drainage. She's a history of lung cancer and is status post chemotherapy and radiation which is been performed at [facility]. She's been under a lot of stress with her husband is currently being treated by her PCP for depression and stress.  She also had concerns about her medications. She has chronic neck pain which is thought to be arthritis though she's not had her x-ray done yet. She's been taking an old prescription of Celebrex and was awaiting a refill on this. She also took one muscle relaxant however it had expired and she has a lot of spasms in her neck which causes headache.   Review of Systems GEN- denies fatigue, fever, weight loss,weakness, recent illness HEENT- denies eye drainage, change in vision, nasal discharge, CVS- denies chest pain, palpitations RESP- denies SOB, cough, wheeze ABD- denies N/V, change in stools, abd pain GU- denies dysuria, hematuria, dribbling, incontinence MSK- + joint pain, muscle aches, injury Neuro- denies headache, dizziness, syncope, seizure activity      Objective:   Physical Exam  GEN- NAD, alert and oriented x3 HEENT- PERRL, EOMI, non injected sclera, pink conjunctiva, MMM, oropharynx clear, Right upper eyelid small area of erythema no Stye felt no drainage Neck- Supple, fair ROM, no spasm CVS- RRR, no murmur RESP-CTAB EXT- No edema Pulses- Radial, DP- 2+ Psych-not overly anxious or depressed       Assessment & Plan:

## 2013-02-14 NOTE — Patient Instructions (Addendum)
Try the flexeril at bedtime Celebrex 1 tablet twice a day  Xanax refilled Get the labs  Take a multivitamin F/U with Dr. Tanya Nones as needed

## 2013-02-15 ENCOUNTER — Encounter: Payer: Self-pay | Admitting: Family Medicine

## 2013-02-15 DIAGNOSIS — R5383 Other fatigue: Secondary | ICD-10-CM | POA: Insufficient documentation

## 2013-02-15 DIAGNOSIS — E039 Hypothyroidism, unspecified: Secondary | ICD-10-CM | POA: Insufficient documentation

## 2013-02-15 DIAGNOSIS — R5381 Other malaise: Secondary | ICD-10-CM | POA: Insufficient documentation

## 2013-02-15 DIAGNOSIS — H00019 Hordeolum externum unspecified eye, unspecified eyelid: Secondary | ICD-10-CM | POA: Insufficient documentation

## 2013-02-15 DIAGNOSIS — M542 Cervicalgia: Secondary | ICD-10-CM | POA: Insufficient documentation

## 2013-02-15 LAB — COMPREHENSIVE METABOLIC PANEL
ALT: 17 U/L (ref 0–35)
AST: 22 U/L (ref 0–37)
Albumin: 4 g/dL (ref 3.5–5.2)
Alkaline Phosphatase: 72 U/L (ref 39–117)
BUN: 15 mg/dL (ref 6–23)
CO2: 29 mEq/L (ref 19–32)
Calcium: 9.6 mg/dL (ref 8.4–10.5)
Chloride: 106 mEq/L (ref 96–112)
Creat: 0.7 mg/dL (ref 0.50–1.10)
Glucose, Bld: 92 mg/dL (ref 70–99)
Potassium: 4.4 mEq/L (ref 3.5–5.3)
Sodium: 144 mEq/L (ref 135–145)
Total Bilirubin: 0.3 mg/dL (ref 0.3–1.2)
Total Protein: 6.4 g/dL (ref 6.0–8.3)

## 2013-02-15 LAB — CBC WITH DIFFERENTIAL/PLATELET
Basophils Absolute: 0.1 10*3/uL (ref 0.0–0.1)
Basophils Relative: 1 % (ref 0–1)
Eosinophils Absolute: 0.2 10*3/uL (ref 0.0–0.7)
Eosinophils Relative: 2 % (ref 0–5)
HCT: 40.7 % (ref 36.0–46.0)
Hemoglobin: 13.8 g/dL (ref 12.0–15.0)
Lymphocytes Relative: 24 % (ref 12–46)
Lymphs Abs: 2.1 10*3/uL (ref 0.7–4.0)
MCH: 32.2 pg (ref 26.0–34.0)
MCHC: 33.9 g/dL (ref 30.0–36.0)
MCV: 94.9 fL (ref 78.0–100.0)
Monocytes Absolute: 0.7 10*3/uL (ref 0.1–1.0)
Monocytes Relative: 8 % (ref 3–12)
Neutro Abs: 5.9 10*3/uL (ref 1.7–7.7)
Neutrophils Relative %: 65 % (ref 43–77)
Platelets: 272 10*3/uL (ref 150–400)
RBC: 4.29 MIL/uL (ref 3.87–5.11)
RDW: 14.3 % (ref 11.5–15.5)
WBC: 9 10*3/uL (ref 4.0–10.5)

## 2013-02-15 LAB — TSH: TSH: 1.009 u[IU]/mL (ref 0.350–4.500)

## 2013-02-15 LAB — T3, FREE: T3, Free: 2.9 pg/mL (ref 2.3–4.2)

## 2013-02-15 LAB — T4: T4, Total: 10.1 ug/dL (ref 5.0–12.5)

## 2013-02-15 NOTE — Assessment & Plan Note (Signed)
likley OA, xray pending from PCP , I sent the new script for celebrex and a flexeril at bedtime for spasm

## 2013-02-15 NOTE — Assessment & Plan Note (Signed)
Check TFT  

## 2013-02-15 NOTE — Assessment & Plan Note (Addendum)
Now with residual erythema no drainage, no further intervention needed, vision in tact

## 2013-02-15 NOTE — Assessment & Plan Note (Signed)
I think this is multifactorial, a lot of stress and dealing with depression. Relationship with husband is deteriorating Labs per above

## 2013-02-17 DIAGNOSIS — F4320 Adjustment disorder, unspecified: Secondary | ICD-10-CM | POA: Diagnosis not present

## 2013-02-18 DIAGNOSIS — M25649 Stiffness of unspecified hand, not elsewhere classified: Secondary | ICD-10-CM | POA: Diagnosis not present

## 2013-02-26 DIAGNOSIS — F4320 Adjustment disorder, unspecified: Secondary | ICD-10-CM | POA: Diagnosis not present

## 2013-02-28 ENCOUNTER — Other Ambulatory Visit: Payer: Self-pay

## 2013-02-28 DIAGNOSIS — Z1231 Encounter for screening mammogram for malignant neoplasm of breast: Secondary | ICD-10-CM

## 2013-03-03 ENCOUNTER — Ambulatory Visit
Admission: RE | Admit: 2013-03-03 | Discharge: 2013-03-03 | Disposition: A | Discharge status: Home / Self Care | Payer: Medicare Other | Source: Ambulatory Visit | Attending: Family Medicine | Admitting: Family Medicine

## 2013-03-03 DIAGNOSIS — M47812 Spondylosis without myelopathy or radiculopathy, cervical region: Secondary | ICD-10-CM | POA: Diagnosis not present

## 2013-03-03 DIAGNOSIS — S61209A Unspecified open wound of unspecified finger without damage to nail, initial encounter: Secondary | ICD-10-CM | POA: Diagnosis not present

## 2013-03-03 DIAGNOSIS — M25649 Stiffness of unspecified hand, not elsewhere classified: Secondary | ICD-10-CM | POA: Diagnosis not present

## 2013-03-03 DIAGNOSIS — M542 Cervicalgia: Secondary | ICD-10-CM

## 2013-03-03 DIAGNOSIS — M79609 Pain in unspecified limb: Secondary | ICD-10-CM | POA: Diagnosis not present

## 2013-03-07 DIAGNOSIS — F4320 Adjustment disorder, unspecified: Secondary | ICD-10-CM | POA: Diagnosis not present

## 2013-03-19 ENCOUNTER — Encounter: Payer: Self-pay | Admitting: Family Medicine

## 2013-03-19 ENCOUNTER — Ambulatory Visit (INDEPENDENT_AMBULATORY_CARE_PROVIDER_SITE_OTHER): Payer: Medicare Other | Admitting: Family Medicine

## 2013-03-19 VITALS — BP 120/80 | HR 78 | Temp 98.0°F | Resp 20 | Wt 170.0 lb

## 2013-03-19 DIAGNOSIS — F329 Major depressive disorder, single episode, unspecified: Secondary | ICD-10-CM | POA: Diagnosis not present

## 2013-03-19 DIAGNOSIS — F4320 Adjustment disorder, unspecified: Secondary | ICD-10-CM | POA: Diagnosis not present

## 2013-03-19 DIAGNOSIS — F32A Depression, unspecified: Secondary | ICD-10-CM

## 2013-03-19 DIAGNOSIS — M5030 Other cervical disc degeneration, unspecified cervical region: Secondary | ICD-10-CM | POA: Diagnosis not present

## 2013-03-19 MED ORDER — DULOXETINE HCL 60 MG PO CPEP: 60.0000 mg | ORAL_CAPSULE | Freq: Every day | ORAL | Status: DC

## 2013-03-19 MED ORDER — OMEPRAZOLE 20 MG PO CPDR: 20.0000 mg | DELAYED_RELEASE_CAPSULE | Freq: Every day | ORAL | Status: DC

## 2013-03-19 MED ORDER — BUPROPION HCL ER (XL) 150 MG PO TB24: 150.0000 mg | ORAL_TABLET | Freq: Every day | ORAL | Status: DC

## 2013-03-19 NOTE — Progress Notes (Signed)
Subjective:    Patient ID: [name], female    DOB: Jan 10, 1941, 72 y.o.   MRN: [id number]  HPI Patient is here for a followup of her neck pain.  She did not receive any benefit from the Celebrex. She has seen some benefit from the Flexeril 10 mg at night. She is taking naproxen twice a day during the days with minimal benefit.  She continues to complain of right-sided neck pain and audible crepitus. A cervical spine x-ray was obtained with findings as dictated below:  Findings: Mild narrowing of the C3-4 interspace; moderate narrowing  of C4-5, C5-6, and C6-7. Anterior endplate spurring N8-G9, most  exuberant C5-6. Smaller posterior spurs C5-6 and C6-7. There is a  reversal of normal cervical lordosis at the C5 level. No  prevertebral soft tissue swelling. Mild uncovertebral spurs  encroach upon neural foramina right C5-6 and C6-7. Asymmetric  facet degenerative hypertrophy C3-4, C4-5 left worse than right.  Negative for fracture.  She also is followed up for depression. At my last office visit I added Wellbutrin XL 150 mg by mouth daily to her Cymbalta this seems to be helping substantially treat her depression. Past Medical History  Diagnosis Date  . Allergic rhinitis   . Anemia   . Diverticula of colon   . Gastric ulcer with hemorrhage   . Esophagitis   . Arrhythmia   . Aseptic necrosis   . Basal cell cancer   . Osteoporosis   . Hyperlipidemia   . Anxiety   . Depression   . Lung cancer   . COPD (chronic obstructive pulmonary disease)   . GERD (gastroesophageal reflux disease)   . Hypertension   . Kidney stones   . Pneumonia    Current Outpatient Prescriptions on File Prior to Visit  Medication Sig Dispense Refill  . ALLERGY 10 MG tablet TAKE 1 TABLET BY MOUTH DAILY  90 tablet  0  . ALPRAZolam (XANAX) 0.5 MG tablet Take 1 tablet (0.5 mg total) by mouth daily as needed for sleep.  30 tablet  1  . aspirin 81 MG tablet Take 81 mg by mouth every other day.       . b  complex-C-folic acid 1 MG capsule Take 1 capsule by mouth daily.        . celecoxib (CELEBREX) 100 MG capsule Take 1 capsule (100 mg total) by mouth 2 (two) times daily.  60 capsule  3  . cyclobenzaprine (FLEXERIL) 10 MG tablet Take 1 tablet (10 mg total) by mouth at bedtime as needed for muscle spasms.  30 tablet  2  . levothyroxine (SYNTHROID, LEVOTHROID) 100 MCG tablet TAKE 1 TABLET EVERY MORNING  90 tablet  3  . lisinopril-hydrochlorothiazide (PRINZIDE,ZESTORETIC) 10-12.5 MG per tablet TAKE 1 TABLET BY MOUTH EVERY DAY  90 tablet  1  . naproxen sodium (ANAPROX) 220 MG tablet Take 220 mg by mouth 2 (two) times daily with a meal.      . simvastatin (ZOCOR) 80 MG tablet Take 1 tablet (80 mg total) by mouth at bedtime.  90 tablet  4  . tiotropium (SPIRIVA) 18 MCG inhalation capsule Place 18 mcg into inhaler and inhale daily.        . traZODone (DESYREL) 50 MG tablet Take 50 mg by mouth at bedtime as needed for sleep.       Marland Kitchen VITAMIN D, CHOLECALCIFEROL, PO Take 1 tablet by mouth daily.      . mometasone (NASONEX) 50 MCG/ACT nasal spray Place 2  sprays into the nose daily as needed (allergy congestion).        No current facility-administered medications on file prior to visit.   Allergies  Allergen Reactions  . Doxycycline Nausea Only  . Erythromycin Nausea Only  . Sulfonamide Derivatives Hives   History   Social History  . Marital Status: Married    Spouse Name: N/A    Number of Children: 3  . Years of Education: N/A   Occupational History  . retired Engineer, civil (consulting)    Social History Main Topics  . Smoking status: Former Smoker -- 0.50 packs/day for 25 years    Types: Cigarettes    Quit date: 09/11/2004  . Smokeless tobacco: Never Used  . Alcohol Use: Yes     Comment: social  . Drug Use: No  . Sexually Active: Not on file   Other Topics Concern  . Not on file   Social History Narrative  . No narrative on file   Family History  Problem Relation Age of Onset  . Graves' disease  Daughter   . Breast cancer Maternal Aunt   . Heart disease Maternal Aunt   . Hypertension Mother   . Arthritis Maternal Grandmother     Review of Systems  All other systems reviewed and are negative.       Objective:   Physical Exam  Vitals reviewed. Cardiovascular: Normal rate and regular rhythm.   Pulmonary/Chest: Effort normal and breath sounds normal.          Assessment & Plan:  1. Depression Continue Cymbalta 60 mg by mouth daily and Wellbutrin XL 150 mg by mouth daily. Recheck in 6 months. - buPROPion (WELLBUTRIN XL) 150 MG 24 hr tablet; Take 1 tablet (150 mg total) by mouth daily.  Dispense: 90 tablet; Refill: 3  2. DDD (degenerative disc disease), cervical X-ray findings are most problematic at C5 and C6. They seem to be triggering muscle spasms in that area to endplate spurring as well as facet hypertrophy. I will consult physical therapy. If did not provide any relief I will consult neurosurgery for possible localized ESI in that area. - Ambulatory referral to Physical Therapy - Ambulatory referral to Neurosurgery

## 2013-03-25 ENCOUNTER — Ambulatory Visit: Payer: Medicare Other | Attending: Family Medicine

## 2013-03-25 DIAGNOSIS — IMO0001 Reserved for inherently not codable concepts without codable children: Secondary | ICD-10-CM | POA: Diagnosis not present

## 2013-03-25 DIAGNOSIS — Z9181 History of falling: Secondary | ICD-10-CM | POA: Insufficient documentation

## 2013-03-25 DIAGNOSIS — R293 Abnormal posture: Secondary | ICD-10-CM | POA: Diagnosis not present

## 2013-03-25 DIAGNOSIS — M542 Cervicalgia: Secondary | ICD-10-CM | POA: Diagnosis not present

## 2013-03-26 DIAGNOSIS — M6281 Muscle weakness (generalized): Secondary | ICD-10-CM | POA: Diagnosis not present

## 2013-03-26 DIAGNOSIS — S61409A Unspecified open wound of unspecified hand, initial encounter: Secondary | ICD-10-CM | POA: Diagnosis not present

## 2013-03-26 DIAGNOSIS — S66909A Unspecified injury of unspecified muscle, fascia and tendon at wrist and hand level, unspecified hand, initial encounter: Secondary | ICD-10-CM | POA: Diagnosis not present

## 2013-03-28 ENCOUNTER — Ambulatory Visit
Admission: RE | Admit: 2013-03-28 | Discharge: 2013-03-28 | Disposition: A | Discharge status: Home / Self Care | Payer: Medicare Other | Source: Ambulatory Visit

## 2013-03-28 DIAGNOSIS — Z1231 Encounter for screening mammogram for malignant neoplasm of breast: Secondary | ICD-10-CM

## 2013-04-01 DIAGNOSIS — K279 Peptic ulcer, site unspecified, unspecified as acute or chronic, without hemorrhage or perforation: Secondary | ICD-10-CM | POA: Diagnosis not present

## 2013-04-01 DIAGNOSIS — R918 Other nonspecific abnormal finding of lung field: Secondary | ICD-10-CM | POA: Diagnosis not present

## 2013-04-01 DIAGNOSIS — C3490 Malignant neoplasm of unspecified part of unspecified bronchus or lung: Secondary | ICD-10-CM | POA: Diagnosis not present

## 2013-04-01 DIAGNOSIS — Z85118 Personal history of other malignant neoplasm of bronchus and lung: Secondary | ICD-10-CM | POA: Diagnosis not present

## 2013-04-01 DIAGNOSIS — Z5189 Encounter for other specified aftercare: Secondary | ICD-10-CM | POA: Diagnosis not present

## 2013-04-01 DIAGNOSIS — Z09 Encounter for follow-up examination after completed treatment for conditions other than malignant neoplasm: Secondary | ICD-10-CM | POA: Diagnosis not present

## 2013-04-01 DIAGNOSIS — I10 Essential (primary) hypertension: Secondary | ICD-10-CM | POA: Diagnosis not present

## 2013-04-15 DIAGNOSIS — M542 Cervicalgia: Secondary | ICD-10-CM | POA: Diagnosis not present

## 2013-04-15 DIAGNOSIS — M4310 Spondylolisthesis, site unspecified: Secondary | ICD-10-CM | POA: Diagnosis not present

## 2013-04-15 DIAGNOSIS — M47812 Spondylosis without myelopathy or radiculopathy, cervical region: Secondary | ICD-10-CM | POA: Diagnosis not present

## 2013-04-15 DIAGNOSIS — M5030 Other cervical disc degeneration, unspecified cervical region: Secondary | ICD-10-CM | POA: Diagnosis not present

## 2013-04-16 ENCOUNTER — Other Ambulatory Visit: Payer: Self-pay

## 2013-04-23 ENCOUNTER — Other Ambulatory Visit: Payer: Self-pay | Admitting: Neurosurgery

## 2013-04-23 DIAGNOSIS — M542 Cervicalgia: Secondary | ICD-10-CM

## 2013-04-27 ENCOUNTER — Ambulatory Visit
Admission: RE | Admit: 2013-04-27 | Discharge: 2013-04-27 | Disposition: A | Discharge status: Home / Self Care | Payer: Medicare Other | Source: Ambulatory Visit | Attending: Neurosurgery | Admitting: Neurosurgery

## 2013-04-27 DIAGNOSIS — M542 Cervicalgia: Secondary | ICD-10-CM

## 2013-04-27 DIAGNOSIS — M47812 Spondylosis without myelopathy or radiculopathy, cervical region: Secondary | ICD-10-CM | POA: Diagnosis not present

## 2013-05-13 ENCOUNTER — Telehealth: Payer: Self-pay | Admitting: Family Medicine

## 2013-05-13 NOTE — Telephone Encounter (Signed)
Flexeril 10 mg 1 QHS prn muscle spasms (she said that she has been having to take 2 at night because of her neck)

## 2013-05-13 NOTE — Telephone Encounter (Signed)
Ok #90

## 2013-05-13 NOTE — Telephone Encounter (Signed)
Last rf 02/14/13 #30 + 2RF  OK refill?

## 2013-05-14 MED ORDER — CYCLOBENZAPRINE HCL 10 MG PO TABS: 10.0000 mg | ORAL_TABLET | Freq: Every evening | ORAL | Status: DC | PRN

## 2013-05-14 NOTE — Telephone Encounter (Signed)
rx called in

## 2013-05-15 ENCOUNTER — Telehealth: Payer: Self-pay | Admitting: Family Medicine

## 2013-05-15 MED ORDER — CYCLOBENZAPRINE HCL 10 MG PO TABS: 10.0000 mg | ORAL_TABLET | Freq: Every evening | ORAL | Status: DC | PRN

## 2013-05-15 NOTE — Telephone Encounter (Signed)
Ok to do

## 2013-05-15 NOTE — Telephone Encounter (Signed)
Needs one month of Flexeril called in to local pharmacy while waiting on mail order to deliver it to her. They told her it would be about 2 weeks before she gets it.

## 2013-05-15 NOTE — Telephone Encounter (Signed)
ok 

## 2013-05-15 NOTE — Telephone Encounter (Signed)
med sent to pharm 

## 2013-05-19 ENCOUNTER — Telehealth: Payer: Self-pay | Admitting: Family Medicine

## 2013-05-19 NOTE — Telephone Encounter (Signed)
This was done 05/15/13 to [facility]

## 2013-05-19 NOTE — Telephone Encounter (Signed)
Cyclobenzaprine 10 mg tab 1 QHS prn muscle spasms #30 last rf 05/03/13

## 2013-07-17 ENCOUNTER — Other Ambulatory Visit: Payer: Self-pay

## 2013-08-13 ENCOUNTER — Telehealth: Payer: Self-pay | Admitting: Family Medicine

## 2013-08-13 MED ORDER — ALPRAZOLAM 0.5 MG PO TABS: 0.5000 mg | ORAL_TABLET | Freq: Every day | ORAL | Status: DC | PRN

## 2013-08-13 NOTE — Telephone Encounter (Signed)
Pt needed refill on xanax, here in office with husband today

## 2013-08-19 ENCOUNTER — Other Ambulatory Visit: Payer: Self-pay | Admitting: Family Medicine

## 2013-08-19 MED ORDER — VALACYCLOVIR HCL 1 G PO TABS: 2000.0000 mg | ORAL_TABLET | Freq: Two times a day (BID) | ORAL | Status: DC

## 2013-08-22 ENCOUNTER — Other Ambulatory Visit: Payer: Self-pay | Admitting: Family Medicine

## 2013-08-27 ENCOUNTER — Telehealth: Payer: Self-pay | Admitting: Family Medicine

## 2013-08-27 NOTE — Telephone Encounter (Signed)
Pt would like Zovirax cream for her fever blisters as the pills did nothing for them??

## 2013-08-28 MED ORDER — ACYCLOVIR 5 % EX OINT: 1.0000 "application " | TOPICAL_OINTMENT | CUTANEOUS | Status: DC

## 2013-08-28 NOTE — Telephone Encounter (Signed)
ok 

## 2013-08-28 NOTE — Telephone Encounter (Signed)
Med sent to pharm 

## 2013-09-12 ENCOUNTER — Other Ambulatory Visit: Payer: Self-pay | Admitting: Family Medicine

## 2013-09-12 ENCOUNTER — Other Ambulatory Visit: Payer: Self-pay | Admitting: Emergency Medicine

## 2013-09-12 ENCOUNTER — Telehealth: Payer: Self-pay | Admitting: Family Medicine

## 2013-09-12 MED ORDER — TIOTROPIUM BROMIDE MONOHYDRATE 18 MCG IN CAPS: 18.0000 ug | ORAL_CAPSULE | Freq: Every day | RESPIRATORY_TRACT | Status: DC

## 2013-09-12 NOTE — Telephone Encounter (Signed)
Medication refilled per protocol. 

## 2013-10-07 DIAGNOSIS — I10 Essential (primary) hypertension: Secondary | ICD-10-CM | POA: Diagnosis not present

## 2013-10-07 DIAGNOSIS — F329 Major depressive disorder, single episode, unspecified: Secondary | ICD-10-CM | POA: Diagnosis not present

## 2013-10-07 DIAGNOSIS — E079 Disorder of thyroid, unspecified: Secondary | ICD-10-CM | POA: Diagnosis not present

## 2013-10-07 DIAGNOSIS — R918 Other nonspecific abnormal finding of lung field: Secondary | ICD-10-CM | POA: Diagnosis not present

## 2013-10-07 DIAGNOSIS — F3289 Other specified depressive episodes: Secondary | ICD-10-CM | POA: Diagnosis not present

## 2013-10-07 DIAGNOSIS — C3490 Malignant neoplasm of unspecified part of unspecified bronchus or lung: Secondary | ICD-10-CM | POA: Diagnosis not present

## 2013-10-12 ENCOUNTER — Other Ambulatory Visit: Payer: Self-pay | Admitting: Family Medicine

## 2013-11-02 ENCOUNTER — Other Ambulatory Visit: Payer: Self-pay | Admitting: Family Medicine

## 2013-11-22 ENCOUNTER — Other Ambulatory Visit: Payer: Self-pay | Admitting: Emergency Medicine

## 2013-11-22 ENCOUNTER — Other Ambulatory Visit: Payer: Self-pay | Admitting: Family Medicine

## 2013-12-01 ENCOUNTER — Ambulatory Visit (INDEPENDENT_AMBULATORY_CARE_PROVIDER_SITE_OTHER): Payer: Medicare Other | Admitting: Family Medicine

## 2013-12-01 ENCOUNTER — Encounter: Payer: Self-pay | Admitting: Family Medicine

## 2013-12-01 VITALS — BP 134/72 | HR 80 | Temp 98.0°F | Resp 12 | Ht 67.0 in | Wt 160.0 lb

## 2013-12-01 DIAGNOSIS — R0789 Other chest pain: Secondary | ICD-10-CM | POA: Diagnosis not present

## 2013-12-01 DIAGNOSIS — E039 Hypothyroidism, unspecified: Secondary | ICD-10-CM

## 2013-12-01 LAB — CBC WITH DIFFERENTIAL/PLATELET
Basophils Absolute: 0.1 10*3/uL (ref 0.0–0.1)
Basophils Relative: 1 % (ref 0–1)
Eosinophils Absolute: 0.2 10*3/uL (ref 0.0–0.7)
Eosinophils Relative: 2 % (ref 0–5)
HCT: 41.8 % (ref 36.0–46.0)
Hemoglobin: 14.2 g/dL (ref 12.0–15.0)
Lymphocytes Relative: 25 % (ref 12–46)
Lymphs Abs: 2.2 10*3/uL (ref 0.7–4.0)
MCH: 32.9 pg (ref 26.0–34.0)
MCHC: 34 g/dL (ref 30.0–36.0)
MCV: 97 fL (ref 78.0–100.0)
Monocytes Absolute: 0.7 10*3/uL (ref 0.1–1.0)
Monocytes Relative: 8 % (ref 3–12)
Neutro Abs: 5.6 10*3/uL (ref 1.7–7.7)
Neutrophils Relative %: 64 % (ref 43–77)
Platelets: 347 10*3/uL (ref 150–400)
RBC: 4.31 MIL/uL (ref 3.87–5.11)
RDW: 13.3 % (ref 11.5–15.5)
WBC: 8.8 10*3/uL (ref 4.0–10.5)

## 2013-12-01 MED ORDER — METHOCARBAMOL 500 MG PO TABS: 500.0000 mg | ORAL_TABLET | Freq: Three times a day (TID) | ORAL | Status: DC

## 2013-12-01 MED ORDER — HYDROCODONE-ACETAMINOPHEN 5-325 MG PO TABS: 1.0000 | ORAL_TABLET | Freq: Four times a day (QID) | ORAL | Status: DC | PRN

## 2013-12-01 NOTE — Progress Notes (Signed)
Patient ID: [name], female   DOB: June 04, 1941, 73 y.o.   MRN: [id number]   Subjective:    Patient ID: [name], female    DOB: Dec 25, 1940, 73 y.o.   MRN: [id number]  Patient presents for Muscle pain in L side of chest radiates to back clavicl  patient here with chest wall pain for the past month. She was actually traveling in [place] when she began to have some discomfort in the Center for chest do a radiates across the left side towards her shoulder blade. She's not having nausea vomiting diaphoresis or shortness of breath associated. She was not evaluated by any physician until today. She did try using Flexeril which helps some as well as her Naprosyn which helps some she has more pain when she takes a deep breath. Of note she does have a remote history of lung cancer and had a CT angiogram done at [facility] about 6 weeks ago which was normal. She also try using a lidocaine patch on her chest but it did not help. She denies any GI symptoms and has had no other medication changes. She otherwise feels very well and does not feel ill    Review Of Systems:  GEN- denies fatigue, fever, weight loss,weakness, recent illness HEENT- denies eye drainage, change in vision, nasal discharge, CVS- denies chest pain, palpitations RESP- denies SOB, cough, wheeze ABD- denies N/V, change in stools, abd pain GU- denies dysuria, hematuria, dribbling, incontinence MSK- denies joint pain, muscle aches, injury Neuro- denies headache, dizziness, syncope, seizure activity       Objective:    BP 134/72  Pulse 80  Temp(Src) 98 F (36.7 C)  Resp 12  Ht 5\' 7"  (1.702 m)  Wt 160 lb (72.576 kg)  BMI 25.05 kg/m2 GEN- NAD, alert and oriented x3 HEENT- PERRL, EOMI, non injected sclera, pink conjunctiva, MMM, oropharynx clear Neck- Supple, CVS- RRR, no murmur Chest wall- TTP center of chest wall along, left pec and side RESP-CTAB ABD-NABS,soft,NT,ND EXT- No edema Pulses- Radial, DP-  2+  EKG- NSR, LAD, no ST chagnes       Assessment & Plan:      Problem List Items Addressed This Visit   None    Visit Diagnoses   Atypical chest pain    -  Primary    Relevant Orders       Comprehensive metabolic panel       CBC with Differential    Unspecified hypothyroidism        Relevant Orders       TSH       Note: This dictation was prepared with Dragon dictation along with smaller phrase technology. Any transcriptional errors that result from this process are unintentional.

## 2013-12-01 NOTE — Patient Instructions (Signed)
We will call with lab results EKG looks good Try new muscle relaxer Try the hydrocodone F/U as needed or call if not better

## 2013-12-01 NOTE — Assessment & Plan Note (Signed)
Chest pain atypical for ACS, EKG normal, I think this is MSK pain, try robaxin, also given norco, decrease naprosyn use, she is on PPI Labs  Recent CT chest benign

## 2013-12-02 LAB — COMPREHENSIVE METABOLIC PANEL
ALT: 18 U/L (ref 0–35)
AST: 22 U/L (ref 0–37)
Albumin: 4.4 g/dL (ref 3.5–5.2)
Alkaline Phosphatase: 73 U/L (ref 39–117)
BUN: 16 mg/dL (ref 6–23)
CO2: 27 mEq/L (ref 19–32)
Calcium: 9.4 mg/dL (ref 8.4–10.5)
Chloride: 103 mEq/L (ref 96–112)
Creat: 0.74 mg/dL (ref 0.50–1.10)
Glucose, Bld: 90 mg/dL (ref 70–99)
Potassium: 3.9 mEq/L (ref 3.5–5.3)
Sodium: 139 mEq/L (ref 135–145)
Total Bilirubin: 0.4 mg/dL (ref 0.2–1.2)
Total Protein: 6.8 g/dL (ref 6.0–8.3)

## 2013-12-02 LAB — TSH: TSH: 2.033 u[IU]/mL (ref 0.350–4.500)

## 2013-12-03 ENCOUNTER — Encounter (HOSPITAL_COMMUNITY): Payer: Self-pay | Admitting: Emergency Medicine

## 2013-12-03 ENCOUNTER — Emergency Department (HOSPITAL_COMMUNITY)
Admission: EM | Admit: 2013-12-03 | Discharge: 2013-12-03 | Disposition: A | Discharge status: Home / Self Care | Payer: Medicare Other | Attending: Emergency Medicine | Admitting: Emergency Medicine

## 2013-12-03 ENCOUNTER — Emergency Department (HOSPITAL_COMMUNITY): Payer: Medicare Other

## 2013-12-03 ENCOUNTER — Ambulatory Visit (INDEPENDENT_AMBULATORY_CARE_PROVIDER_SITE_OTHER): Payer: Medicare Other | Admitting: Family Medicine

## 2013-12-03 ENCOUNTER — Encounter: Payer: Self-pay | Admitting: Family Medicine

## 2013-12-03 VITALS — BP 138/86 | HR 80 | Temp 97.7°F | Resp 16 | Ht 67.0 in | Wt 160.0 lb

## 2013-12-03 DIAGNOSIS — R071 Chest pain on breathing: Secondary | ICD-10-CM | POA: Diagnosis not present

## 2013-12-03 DIAGNOSIS — I10 Essential (primary) hypertension: Secondary | ICD-10-CM | POA: Insufficient documentation

## 2013-12-03 DIAGNOSIS — F329 Major depressive disorder, single episode, unspecified: Secondary | ICD-10-CM | POA: Diagnosis not present

## 2013-12-03 DIAGNOSIS — F3289 Other specified depressive episodes: Secondary | ICD-10-CM | POA: Diagnosis not present

## 2013-12-03 DIAGNOSIS — K219 Gastro-esophageal reflux disease without esophagitis: Secondary | ICD-10-CM | POA: Diagnosis not present

## 2013-12-03 DIAGNOSIS — Z8719 Personal history of other diseases of the digestive system: Secondary | ICD-10-CM | POA: Diagnosis not present

## 2013-12-03 DIAGNOSIS — M549 Dorsalgia, unspecified: Secondary | ICD-10-CM | POA: Diagnosis not present

## 2013-12-03 DIAGNOSIS — Z791 Long term (current) use of non-steroidal anti-inflammatories (NSAID): Secondary | ICD-10-CM | POA: Insufficient documentation

## 2013-12-03 DIAGNOSIS — Z79899 Other long term (current) drug therapy: Secondary | ICD-10-CM | POA: Insufficient documentation

## 2013-12-03 DIAGNOSIS — F411 Generalized anxiety disorder: Secondary | ICD-10-CM | POA: Diagnosis not present

## 2013-12-03 DIAGNOSIS — Z85118 Personal history of other malignant neoplasm of bronchus and lung: Secondary | ICD-10-CM | POA: Insufficient documentation

## 2013-12-03 DIAGNOSIS — Z87891 Personal history of nicotine dependence: Secondary | ICD-10-CM | POA: Insufficient documentation

## 2013-12-03 DIAGNOSIS — E785 Hyperlipidemia, unspecified: Secondary | ICD-10-CM | POA: Insufficient documentation

## 2013-12-03 DIAGNOSIS — Z7982 Long term (current) use of aspirin: Secondary | ICD-10-CM | POA: Diagnosis not present

## 2013-12-03 DIAGNOSIS — M810 Age-related osteoporosis without current pathological fracture: Secondary | ICD-10-CM | POA: Insufficient documentation

## 2013-12-03 DIAGNOSIS — J4489 Other specified chronic obstructive pulmonary disease: Secondary | ICD-10-CM | POA: Insufficient documentation

## 2013-12-03 DIAGNOSIS — R0789 Other chest pain: Secondary | ICD-10-CM

## 2013-12-03 DIAGNOSIS — J449 Chronic obstructive pulmonary disease, unspecified: Secondary | ICD-10-CM | POA: Diagnosis not present

## 2013-12-03 DIAGNOSIS — J984 Other disorders of lung: Secondary | ICD-10-CM | POA: Diagnosis not present

## 2013-12-03 LAB — CBC
HCT: 40.5 % (ref 36.0–46.0)
Hemoglobin: 14 g/dL (ref 12.0–15.0)
MCH: 33.9 pg (ref 26.0–34.0)
MCHC: 34.6 g/dL (ref 30.0–36.0)
MCV: 98.1 fL (ref 78.0–100.0)
Platelets: 283 10*3/uL (ref 150–400)
RBC: 4.13 MIL/uL (ref 3.87–5.11)
RDW: 13 % (ref 11.5–15.5)
WBC: 8.8 10*3/uL (ref 4.0–10.5)

## 2013-12-03 LAB — BASIC METABOLIC PANEL
BUN: 18 mg/dL (ref 6–23)
CO2: 28 mEq/L (ref 19–32)
Calcium: 9.3 mg/dL (ref 8.4–10.5)
Chloride: 99 mEq/L (ref 96–112)
Creatinine, Ser: 0.62 mg/dL (ref 0.50–1.10)
GFR calc Af Amer: >90 mL/min (ref >90)
GFR calc non Af Amer: 89 mL/min — ABNORMAL LOW (ref >90)
Glucose, Bld: 92 mg/dL (ref 70–99)
Potassium: 4.2 mEq/L (ref 3.7–5.3)
Sodium: 138 mEq/L (ref 137–147)

## 2013-12-03 LAB — I-STAT TROPONIN, ED: Troponin i, poc: 0 ng/mL (ref 0.00–0.08)

## 2013-12-03 LAB — PRO B NATRIURETIC PEPTIDE: Pro B Natriuretic peptide (BNP): 97.5 pg/mL (ref 0–125)

## 2013-12-03 MED ORDER — ASPIRIN 325 MG PO TABS
325.0000 mg | ORAL_TABLET | ORAL | Status: DC
  Filled 2013-12-03: qty 1

## 2013-12-03 MED ORDER — IOHEXOL 350 MG/ML SOLN
80.0000 mL | Freq: Once | INTRAVENOUS | Status: AC | PRN
  Administered 2013-12-03: 80 mL via INTRAVENOUS

## 2013-12-03 MED ORDER — ONDANSETRON HCL 4 MG/2ML IJ SOLN
4.0000 mg | Freq: Once | INTRAMUSCULAR | Status: AC
  Administered 2013-12-03: 4 mg via INTRAVENOUS
  Filled 2013-12-03: qty 2

## 2013-12-03 MED ORDER — SODIUM CHLORIDE 0.9 % IV SOLN
INTRAVENOUS | Status: DC
Start: 2013-12-04 — End: 2013-12-03
  Administered 2013-12-03: 14:00:00 via INTRAVENOUS

## 2013-12-03 MED ORDER — HYDROMORPHONE HCL 2 MG PO TABS: 2.0000 mg | ORAL_TABLET | ORAL | Status: DC | PRN

## 2013-12-03 MED ORDER — HYDROMORPHONE HCL PF 1 MG/ML IJ SOLN
1.0000 mg | Freq: Once | INTRAMUSCULAR | Status: AC
  Administered 2013-12-03: 1 mg via INTRAVENOUS
  Filled 2013-12-03: qty 1

## 2013-12-03 MED ORDER — ASPIRIN 81 MG PO CHEW
324.0000 mg | CHEWABLE_TABLET | Freq: Once | ORAL | Status: DC
  Filled 2013-12-03: qty 4

## 2013-12-03 MED ORDER — OXYCODONE-ACETAMINOPHEN 5-325 MG PO TABS: 1.0000 | ORAL_TABLET | Freq: Four times a day (QID) | ORAL | Status: DC | PRN

## 2013-12-03 MED ORDER — ASPIRIN 81 MG PO CHEW
324.0000 mg | CHEWABLE_TABLET | ORAL | Status: AC
  Administered 2013-12-03: 324 mg via ORAL

## 2013-12-03 NOTE — Progress Notes (Signed)
Patient ID: CAIRO LINGENFELTER, female   DOB: 01-Aug-1941, 73 y.o.   MRN: [id number]   Subjective:    Patient ID: [name], female    DOB: 1940/09/17, 73 y.o.   MRN: [id number]  Patient presents for Chest pain to L side  Pt here with ongoing left chest pain, continues to radiates around her chest to her back, worse she she moves or takes a deep breath,  feels tender to touch in her chest area and when she moves her arm. Took muscle relaxer and hydrocodone with no improvement. EKG was unremarkable on Monday 3/23, CMET, CBC, TSH were normal. Denies GI symptoms of heartburn, has SOB at baseline but at times      Review Of Systems:  GEN- denies fatigue, fever, weight loss,weakness, recent illness HEENT- denies eye drainage, change in vision, nasal discharge, CVS- +chest pain, palpitations RESP- denies SOB, cough, wheeze ABD- denies N/V, change in stools, abd pain        Objective:    BP 138/86  Pulse 80  Temp(Src) 97.7 F (36.5 C)  Resp 16  Ht 5\' 7"  (1.702 m)  Wt 160 lb (72.576 kg)  BMI 25.05 kg/m2 GEN- NAD, alert and oriented x3 Uncomfortable appearing, sent to ER for urgent evalulation        Assessment & Plan:      Problem List Items Addressed This Visit   None      Note: This dictation was prepared with Dragon dictation along with smaller phrase technology. Any transcriptional errors that result from this process are unintentional.

## 2013-12-03 NOTE — ED Provider Notes (Signed)
CSN: [id number]     Arrival date & time 12/03/13  1129 History   First MD Initiated Contact with Patient 12/03/13 1140     Chief Complaint  Patient presents with  . Chest Pain     (Consider location/radiation/quality/duration/timing/severity/associated sxs/prior Treatment) The history is provided by the patient.   73 year old female one-month history of left-sided the chest pain pleuritic in nature. Radiates to the left back became more severe in the past few days. Worse with movement palpation and inspiration. Referred in by her primary care Dr. for further evaluation. Patient's been taking hydrocodone for this pain it is not holding it. Pain is left chest radiates to the left back 10 out of 10.  Past Medical History  Diagnosis Date  . Allergic rhinitis   . Anemia   . Diverticula of colon   . Gastric ulcer with hemorrhage   . Esophagitis   . Arrhythmia   . Aseptic necrosis   . Basal cell cancer   . Osteoporosis   . Hyperlipidemia   . Anxiety   . Depression   . Lung cancer   . COPD (chronic obstructive pulmonary disease)   . GERD (gastroesophageal reflux disease)   . Hypertension   . Kidney stones   . Pneumonia    Past Surgical History  Procedure Laterality Date  . Laparoscopic salpingoopherectomy    . Thoracotomy    . Mitral valve replacement     Family History  Problem Relation Age of Onset  . Graves' disease Daughter   . Breast cancer Maternal Aunt   . Heart disease Maternal Aunt   . Hypertension Mother   . Arthritis Maternal Grandmother    History  Substance Use Topics  . Smoking status: Former Smoker -- 0.50 packs/day for 25 years    Types: Cigarettes    Quit date: 09/11/2004  . Smokeless tobacco: Never Used  . Alcohol Use: Yes     Comment: social   OB History   Grav Para Term Preterm Abortions TAB SAB Ect Mult Living                 Review of Systems  Constitutional: Negative for fever.  HENT: Negative for congestion.   Eyes: Negative for visual  disturbance.  Respiratory: Positive for shortness of breath.   Cardiovascular: Positive for chest pain.  Gastrointestinal: Negative for abdominal pain.  Genitourinary: Negative for dysuria.  Musculoskeletal: Positive for back pain.  Skin: Negative for rash.  Neurological: Negative for headaches.  Hematological: Does not bruise/bleed easily.  Psychiatric/Behavioral: Negative for confusion.      Allergies  Doxycycline; Erythromycin; and Sulfonamide derivatives  Home Medications   Current Outpatient Rx  Name  Route  Sig  Dispense  Refill  . ALPRAZolam (XANAX) 0.5 MG tablet   Oral   Take 1 tablet (0.5 mg total) by mouth daily as needed for sleep.   30 tablet   1   . aspirin 81 MG tablet   Oral   Take 81 mg by mouth every other day.          . b complex-C-folic acid 1 MG capsule   Oral   Take 1 capsule by mouth daily.           Marland Kitchen buPROPion (WELLBUTRIN XL) 150 MG 24 hr tablet   Oral   Take 1 tablet (150 mg total) by mouth daily.   90 tablet   3   . DULoxetine (CYMBALTA) 60 MG capsule  TAKE 1 CAPSULE DAILY   90 capsule   3   . HYDROcodone-acetaminophen (NORCO) 5-325 MG per tablet   Oral   Take 1 tablet by mouth every 6 (six) hours as needed for moderate pain.   30 tablet   0   . levothyroxine (SYNTHROID, LEVOTHROID) 100 MCG tablet      TAKE 1 TABLET EVERY MORNING   90 tablet   3   . lisinopril-hydrochlorothiazide (PRINZIDE,ZESTORETIC) 10-12.5 MG per tablet      TAKE 1 TABLET DAILY   90 tablet   3   . loratadine (CLARITIN) 10 MG tablet      TAKE 1 TABLET DAILY   90 tablet   3   . methocarbamol (ROBAXIN) 500 MG tablet   Oral   Take 1 tablet (500 mg total) by mouth 3 (three) times daily.   45 tablet   1   . naproxen sodium (ANAPROX) 220 MG tablet   Oral   Take 220 mg by mouth 2 (two) times daily with a meal.         . omeprazole (PRILOSEC) 20 MG capsule   Oral   Take 1 capsule (20 mg total) by mouth daily.   90 capsule   4   .  simvastatin (ZOCOR) 80 MG tablet   Oral   Take 1 tablet (80 mg total) by mouth at bedtime.   90 tablet   4   . tiotropium (SPIRIVA HANDIHALER) 18 MCG inhalation capsule   Inhalation   Place 1 capsule (18 mcg total) into inhaler and inhale daily.   30 capsule   11   . traZODone (DESYREL) 50 MG tablet   Oral   Take 50 mg by mouth at bedtime as needed for sleep.          Marland Kitchen VITAMIN D, CHOLECALCIFEROL, PO   Oral   Take 1 tablet by mouth daily.         Marland Kitchen HYDROmorphone (DILAUDID) 2 MG tablet   Oral   Take 1 tablet (2 mg total) by mouth every 4 (four) hours as needed for severe pain.   30 tablet   0   . oxyCODONE-acetaminophen (PERCOCET/ROXICET) 5-325 MG per tablet   Oral   Take 1-2 tablets by mouth every 6 (six) hours as needed for severe pain.   20 tablet   0    BP 147/78  Pulse 76  Temp(Src) 98.2 F (36.8 C) (Oral)  Resp 13  Wt 160 lb (72.576 kg)  SpO2 98% Physical Exam  Nursing note and vitals reviewed. Constitutional: She is oriented to person, place, and time. She appears well-developed and well-nourished. No distress.  HENT:  Head: Normocephalic and atraumatic.  Mouth/Throat: Oropharynx is clear and moist.  Eyes: Conjunctivae and EOM are normal. Pupils are equal, round, and reactive to light.  Neck: Normal range of motion.  Cardiovascular: Normal rate, regular rhythm and normal heart sounds.   No murmur heard. Pulmonary/Chest: Effort normal and breath sounds normal. No respiratory distress.  Abdominal: Soft. Bowel sounds are normal. There is no tenderness.  Musculoskeletal: Normal range of motion. She exhibits no edema.  Neurological: She is alert and oriented to person, place, and time. No cranial nerve deficit. She exhibits normal muscle tone. Coordination normal.  Skin: Skin is warm. No rash noted.    ED Course  Procedures (including critical care time) Labs Review Labs Reviewed  BASIC METABOLIC PANEL - Abnormal; Notable for the following:    GFR  calc non  Af Amer 89 (*)    All other components within normal limits  CBC  PRO B NATRIURETIC PEPTIDE  I-STAT TROPOININ, ED   Results for orders placed during the hospital encounter of 12/03/13  CBC      Result Value Ref Range   WBC 8.8  4.0 - 10.5 K/uL   RBC 4.13  3.87 - 5.11 MIL/uL   Hemoglobin 14.0  12.0 - 15.0 g/dL   HCT 40.5  36.0 - 46.0 %   MCV 98.1  78.0 - 100.0 fL   MCH 33.9  26.0 - 34.0 pg   MCHC 34.6  30.0 - 36.0 g/dL   RDW 13.0  11.5 - 15.5 %   Platelets 283  150 - 400 K/uL  BASIC METABOLIC PANEL      Result Value Ref Range   Sodium 138  137 - 147 mEq/L   Potassium 4.2  3.7 - 5.3 mEq/L   Chloride 99  96 - 112 mEq/L   CO2 28  19 - 32 mEq/L   Glucose, Bld 92  70 - 99 mg/dL   BUN 18  6 - 23 mg/dL   Creatinine, Ser 0.62  0.50 - 1.10 mg/dL   Calcium 9.3  8.4 - 10.5 mg/dL   GFR calc non Af Amer 89 (*) >90 mL/min   GFR calc Af Amer >90  >90 mL/min  PRO B NATRIURETIC PEPTIDE      Result Value Ref Range   Pro B Natriuretic peptide (BNP) 97.5  0 - 125 pg/mL  I-STAT TROPOININ, ED      Result Value Ref Range   Troponin i, poc 0.00  0.00 - 0.08 ng/mL   Comment 3             Imaging Review Dg Chest 2 View  12/03/2013   CLINICAL DATA:  Chest pain. History of lung cancer and left thoracotomy.  EXAM: CHEST  2 VIEW  COMPARISON:  DG CERVICAL SPINE WITH FLEX & EXTEND dated 04/15/2013; CT ANGIO CHEST W/CM &/OR WO/CM dated 06/26/2012; DG CHEST 2 VIEW dated 01/31/2012  FINDINGS: The heart size and mediastinal contours are stable without evidence of adenopathy. There is new mild left perihilar distortion. The previously demonstrated left upper lobe nodule now appears partially obscured by adjacent pleural parenchymal density. Interval enlargement of the nodule cannot be excluded, especially if there has not been interval surgery. The right lung appears clear. There is no pleural effusion or pneumothorax. Old thoracotomy defects are present bilaterally.  IMPRESSION: New left perihilar  distortion and pleural parenchymal density, partially obscuring a previously demonstrated nodule. If this has not been previously resected or evaluated by interval CT, follow up CT would be recommended to exclude local recurrence.   Electronically Signed   By: Camie Patience M.D.   On: 12/03/2013 13:38   Ct Angio Chest Pe W/cm &/or Wo Cm  12/03/2013   CLINICAL DATA:  Left chest wall pain. Per report, remote history of lung cancer. The patient reports a chest CT at [facility] 6 weeks ago which she reports as normal but is not available for direct comparison.  EXAM: CT ANGIOGRAPHY CHEST WITH CONTRAST  TECHNIQUE: Multidetector CT imaging of the chest was performed using the standard protocol during bolus administration of intravenous contrast. Multiplanar CT image reconstructions and MIPs were obtained to evaluate the vascular anatomy.  CONTRAST:  64mL OMNIPAQUE IOHEXOL 350 MG/ML SOLN  COMPARISON:  DG CHEST 2 VIEW dated 12/03/2013; CT ANGIO CHEST W/CM &/OR WO/CM dated 06/26/2012  FINDINGS: Curvilinear left upper lobe subpleural surgical change suggesting partial left upper lobectomy is reidentified. An irregular curvilinear soft tissue density adjacent to this area is larger than previously, measuring 3.7 x 2.8 cm image 27, with adjacent pleural thickening. Subpleural bibasilar curvilinear atelectasis or scarring noted. 8 mm area of patchy ground-glass airspace opacity in the superior segment right lower lobe is reidentified image 38. Central airways are patent.  The examination is adequate for evaluation for acute pulmonary embolism up to and including the 3rd order pulmonary arteries. No focal filling defect is identified up to and including the 3rd order pulmonary arteries to suggest acute pulmonary embolism. Heart size is at upper limits of normal. Great vessels are normal in caliber. No lymphadenopathy. Small hiatal hernia noted.  No acute osseous abnormality. No lytic or sclerotic osseous lesion. Left thoracotomy  defect noted.  Review of the MIP images confirms the above findings.  IMPRESSION: Postsurgical change status post partial left upper lobectomy again identified. However, in the interval since the 2013 prior exam, there has been increase in soft tissue density adjacent to the resection margin. Although this could represent rounded atelectasis/scarring, local tumor recurrence is not excluded. Further evaluation with PET-CT as an outpatient on a nonemergent basis is recommended.  No acute cardiopulmonary process. Specifically, no CT evidence for acute pulmonary embolism up to and including the third order pulmonary arteries.   Electronically Signed   By: Conchita Paris M.D.   On: 12/03/2013 14:52     EKG Interpretation None      Date: 12/03/2013  Rate: 77  Rhythm: normal sinus rhythm  QRS Axis: normal  Intervals: normal  ST/T Wave abnormalities: normal  Conduction Disutrbances:none  Narrative Interpretation:   Old EKG Reviewed: none available Did not crossover in MUSE  MDM   Final diagnoses:  Chest wall pain    Patient with pleuritic-like chest pain on the left side for about a month, worse here lately. Currently quite severe. Worse with movement and inspiration.  Workup here negative. Troponin was negative as expected. EKG without any acute findings. CT angios the chest without evidence of pneumonia significant mass pulmonary edema or pulmonary embolism. Patient has had a resection for lung CA area of thickening in that area will require followup PET scan. Patient is aware of this.   Clearly patient's symptoms are pleuritic in nature. Treat with pain medication. Patient with sniffing improvement here with hydromorphone IV. Patient had been taking hydrocodone at home we'll switch to Percocet and supplement with oral hydromorphone.   Mervin Kung, MD 12/03/13 1540

## 2013-12-03 NOTE — ED Notes (Signed)
Pt c/o left sided CP with radiation to back; pt sts started x 1 month ago and has seen PCP but sts becoming more severe; pt sts pain worse with movement, palpation and inspiration

## 2013-12-03 NOTE — Assessment & Plan Note (Signed)
Her chest pain sounds more atypical , but with worsening symptoms, no improvement with meds , possible PE as she did travel though sats have been normal, possible referred GI pain Based on her presentation today with worse pain send to ER for evaluation  I discussed with charge nurse Pt agrees to go, states she will drive herself

## 2013-12-03 NOTE — Patient Instructions (Signed)
Go directly to ER Chest pain rule out CT of Chest to be done

## 2013-12-03 NOTE — Discharge Instructions (Signed)
Chest Wall Pain Chest wall pain is pain felt in or around the chest bones and muscles. It may take up to 6 weeks to get better. It may take longer if you are active. Chest wall pain can happen on its own. Other times, things like germs, injury, coughing, or exercise can cause the pain. HOME CARE   Avoid activities that make you tired or cause pain. Try not to use your chest, belly (abdominal), or side muscles. Do not use heavy weights.  Put ice on the sore area.  Put ice in a plastic bag.  Place a towel between your skin and the bag.  Leave the ice on for 15-20 minutes for the first 2 days.  Only take medicine as told by your doctor. GET HELP RIGHT AWAY IF:   You have more pain or are very uncomfortable.  You have a fever.  Your chest pain gets worse.  You have new problems.  You feel sick to your stomach (nauseous) or throw up (vomit).  You start to sweat or feel lightheaded.  You have a cough with mucus (phlegm).  You cough up blood. MAKE SURE YOU:   Understand these instructions.  Will watch your condition.  Will get help right away if you are not doing well or get worse. Document Released: 02/14/2008 Document Revised: 11/20/2011 Document Reviewed: 04/24/2011 [facility] Patient Information 2014 [place], [place]. Workup essentially negative. However we discussed the CT finding and the need for a followup the PET scan. Make appointment to follow a regular Dr. Joellyn Rued the Percocet as directed supplement with the hydromorphone as needed. Stop taking the Vicodin. Return for any new or worse symptoms.

## 2013-12-08 DIAGNOSIS — C3490 Malignant neoplasm of unspecified part of unspecified bronchus or lung: Secondary | ICD-10-CM | POA: Diagnosis not present

## 2013-12-10 DIAGNOSIS — B009 Herpesviral infection, unspecified: Secondary | ICD-10-CM | POA: Diagnosis not present

## 2013-12-10 DIAGNOSIS — D235 Other benign neoplasm of skin of trunk: Secondary | ICD-10-CM | POA: Diagnosis not present

## 2013-12-10 DIAGNOSIS — L570 Actinic keratosis: Secondary | ICD-10-CM | POA: Diagnosis not present

## 2013-12-16 DIAGNOSIS — Z9889 Other specified postprocedural states: Secondary | ICD-10-CM | POA: Diagnosis not present

## 2013-12-16 DIAGNOSIS — C3490 Malignant neoplasm of unspecified part of unspecified bronchus or lung: Secondary | ICD-10-CM | POA: Diagnosis not present

## 2013-12-16 DIAGNOSIS — R079 Chest pain, unspecified: Secondary | ICD-10-CM | POA: Diagnosis not present

## 2013-12-16 DIAGNOSIS — Z902 Acquired absence of lung [part of]: Secondary | ICD-10-CM | POA: Diagnosis not present

## 2013-12-16 DIAGNOSIS — R071 Chest pain on breathing: Secondary | ICD-10-CM | POA: Diagnosis not present

## 2013-12-16 DIAGNOSIS — C3400 Malignant neoplasm of unspecified main bronchus: Secondary | ICD-10-CM | POA: Diagnosis not present

## 2013-12-16 DIAGNOSIS — Z8719 Personal history of other diseases of the digestive system: Secondary | ICD-10-CM | POA: Diagnosis not present

## 2013-12-16 DIAGNOSIS — Z09 Encounter for follow-up examination after completed treatment for conditions other than malignant neoplasm: Secondary | ICD-10-CM | POA: Diagnosis not present

## 2013-12-16 DIAGNOSIS — M47812 Spondylosis without myelopathy or radiculopathy, cervical region: Secondary | ICD-10-CM | POA: Diagnosis not present

## 2013-12-16 DIAGNOSIS — Z85118 Personal history of other malignant neoplasm of bronchus and lung: Secondary | ICD-10-CM | POA: Diagnosis not present

## 2013-12-18 ENCOUNTER — Encounter: Payer: Self-pay | Admitting: Family Medicine

## 2013-12-29 ENCOUNTER — Other Ambulatory Visit: Payer: Self-pay | Admitting: Family Medicine

## 2013-12-29 NOTE — Telephone Encounter (Signed)
?   OK to Refill  

## 2013-12-29 NOTE — Telephone Encounter (Signed)
ok 

## 2013-12-30 NOTE — Telephone Encounter (Signed)
Last Rf 12/3 #30 +1  Last OV 3/25  OK refill?

## 2014-01-11 ENCOUNTER — Other Ambulatory Visit: Payer: Self-pay | Admitting: Family Medicine

## 2014-01-13 ENCOUNTER — Ambulatory Visit (INDEPENDENT_AMBULATORY_CARE_PROVIDER_SITE_OTHER): Payer: Medicare Other | Admitting: Family Medicine

## 2014-01-13 ENCOUNTER — Encounter: Payer: Self-pay | Admitting: Family Medicine

## 2014-01-13 VITALS — BP 128/74 | HR 80 | Temp 97.4°F | Resp 16 | Ht 67.5 in | Wt 159.0 lb

## 2014-01-13 DIAGNOSIS — R071 Chest pain on breathing: Secondary | ICD-10-CM

## 2014-01-13 DIAGNOSIS — G8929 Other chronic pain: Secondary | ICD-10-CM

## 2014-01-13 DIAGNOSIS — E785 Hyperlipidemia, unspecified: Secondary | ICD-10-CM

## 2014-01-13 DIAGNOSIS — R0789 Other chest pain: Principal | ICD-10-CM

## 2014-01-13 DIAGNOSIS — E039 Hypothyroidism, unspecified: Secondary | ICD-10-CM | POA: Diagnosis not present

## 2014-01-13 DIAGNOSIS — I10 Essential (primary) hypertension: Secondary | ICD-10-CM | POA: Diagnosis not present

## 2014-01-13 MED ORDER — SIMVASTATIN 80 MG PO TABS: 80.0000 mg | ORAL_TABLET | Freq: Every day | ORAL | Status: DC

## 2014-01-13 MED ORDER — FLUTICASONE PROPIONATE 50 MCG/ACT NA SUSP: 2.0000 | Freq: Every day | NASAL | Status: DC

## 2014-01-13 MED ORDER — PREDNISONE 20 MG PO TABS: ORAL_TABLET | ORAL | Status: DC

## 2014-01-13 NOTE — Progress Notes (Signed)
Subjective:    Patient ID: [name], female    DOB: 07-16-1941, 73 y.o.   MRN: [id number]  HPI Patient is here today for followup of her multiple medical conditions can also discuss her chest wall pain. She seen several specialist regarding her chest wall pain. They performed a thoracic MRI to evaluate for neuropathic pain radiating around her left chest into her left sternum. The MRI showed no degenerative disc disease in the thoracic spine. However confirmed that to moderate spinal stenosis at C5-C6 is worsened. The pain she is having in her left chest starts in her back and radiates around to her sternum in a bandlike burning sharp pattern. She also is tightness in her chest and restriction with breathing. She denies any pleurisy or hemoptysis. EKG was normal. She been taking pain medication which has helped. The pain is slowly improving. Chest tenderness to palpation of her sternum similar to costochondritis.  Regarding her hypertension she is currently taking Zestoretic 10/12.51 by mouth daily. Her blood pressures well controlled. She is also taking simvastatin 80 mg by mouth daily. She denies any myalgias right upper quadrant pain. She is overdue for fasting lipid panel. She is also taking levothyroxin 100 mcg by mouth daily for hypothyroidism. She is overdue for TSH. She is also due for prevnar 13. Past Medical History  Diagnosis Date  . Allergic rhinitis   . Anemia   . Diverticula of colon   . Gastric ulcer with hemorrhage   . Esophagitis   . Arrhythmia   . Aseptic necrosis   . Basal cell cancer   . Osteoporosis   . Hyperlipidemia   . Anxiety   . Depression   . Lung cancer   . COPD (chronic obstructive pulmonary disease)   . GERD (gastroesophageal reflux disease)   . Hypertension   . Kidney stones   . Pneumonia    Current Outpatient Prescriptions on File Prior to Visit  Medication Sig Dispense Refill  . ALPRAZolam (XANAX) 0.5 MG tablet TAKE 1 TABLET BY MOUTH AT BEDTIME  AS NEEDED  30 tablet  1  . aspirin 81 MG tablet Take 81 mg by mouth every other day.       . b complex-C-folic acid 1 MG capsule Take 1 capsule by mouth daily.        Marland Kitchen buPROPion (WELLBUTRIN XL) 150 MG 24 hr tablet Take 1 tablet (150 mg total) by mouth daily.  90 tablet  3  . cyclobenzaprine (FLEXERIL) 10 MG tablet TAKE 1 TABLET BY MOUTH AT BEDTIME  90 tablet  0  . DULoxetine (CYMBALTA) 60 MG capsule TAKE 1 CAPSULE DAILY  90 capsule  3  . HYDROcodone-acetaminophen (NORCO) 5-325 MG per tablet Take 1 tablet by mouth every 6 (six) hours as needed for moderate pain.  30 tablet  0  . HYDROmorphone (DILAUDID) 2 MG tablet Take 1 tablet (2 mg total) by mouth every 4 (four) hours as needed for severe pain.  30 tablet  0  . levothyroxine (SYNTHROID, LEVOTHROID) 100 MCG tablet TAKE 1 TABLET EVERY MORNING  90 tablet  2  . lisinopril-hydrochlorothiazide (PRINZIDE,ZESTORETIC) 10-12.5 MG per tablet TAKE 1 TABLET DAILY  90 tablet  3  . loratadine (CLARITIN) 10 MG tablet TAKE 1 TABLET DAILY  90 tablet  3  . naproxen sodium (ANAPROX) 220 MG tablet Take 220 mg by mouth 2 (two) times daily with a meal.      . omeprazole (PRILOSEC) 20 MG capsule Take 1 capsule (20  mg total) by mouth daily.  90 capsule  4  . oxyCODONE-acetaminophen (PERCOCET/ROXICET) 5-325 MG per tablet Take 1-2 tablets by mouth every 6 (six) hours as needed for severe pain.  20 tablet  0  . tiotropium (SPIRIVA HANDIHALER) 18 MCG inhalation capsule Place 1 capsule (18 mcg total) into inhaler and inhale daily.  30 capsule  11  . traZODone (DESYREL) 50 MG tablet Take 50 mg by mouth at bedtime as needed for sleep.       Marland Kitchen VITAMIN D, CHOLECALCIFEROL, PO Take 1 tablet by mouth daily.       No current facility-administered medications on file prior to visit.   Past Surgical History  Procedure Laterality Date  . Laparoscopic salpingoopherectomy    . Thoracotomy    . Mitral valve replacement     Allergies  Allergen Reactions  . Doxycycline Nausea Only   . Erythromycin Nausea Only  . Sulfonamide Derivatives Hives   History   Social History  . Marital Status: Married    Spouse Name: N/A    Number of Children: 3  . Years of Education: N/A   Occupational History  . retired Marine scientist    Social History Main Topics  . Smoking status: Former Smoker -- 0.50 packs/day for 25 years    Types: Cigarettes    Quit date: 09/11/2004  . Smokeless tobacco: Never Used  . Alcohol Use: Yes     Comment: social  . Drug Use: No  . Sexual Activity: Not on file   Other Topics Concern  . Not on file   Social History Narrative  . No narrative on file      Review of Systems  All other systems reviewed and are negative.      Objective:   Physical Exam  Vitals reviewed. Constitutional: She appears well-developed and well-nourished. No distress.  HENT:  Head: Normocephalic and atraumatic.  Nose: Nose normal.  Mouth/Throat: Oropharynx is clear and moist. No oropharyngeal exudate.  Neck: Neck supple. No JVD present. No thyromegaly present.  Cardiovascular: Normal rate, regular rhythm and normal heart sounds.   Pulmonary/Chest: Effort normal and breath sounds normal. No respiratory distress. She has no wheezes. She has no rales. She exhibits tenderness.  Abdominal: Soft. Bowel sounds are normal. She exhibits no distension. There is no tenderness. There is no rebound and no guarding.  Musculoskeletal: She exhibits no edema.  Lymphadenopathy:    She has no cervical adenopathy.  Skin: She is not diaphoretic.          Assessment & Plan:  1. Chronic chest wall pain Try a prednisone Dosepak to treat for possible costochondritis as a cause of her chest pain. - predniSONE (DELTASONE) 20 MG tablet; 3 tabs poqday 1-2, 2 tabs poqday 3-4, 1 tab poqday 5-6  Dispense: 12 tablet; Refill: 0  2. HTN (hypertension) Blood pressures well controlled. Continue current medications at their present dosages. - COMPLETE METABOLIC PANEL WITH GFR; Future  3. HLD  (hyperlipidemia) Return fasting for fasting lipid panel. Goal LDL is less than 130 - COMPLETE METABOLIC PANEL WITH GFR; Future - Lipid panel; Future  4. Unspecified hypothyroidism Recheck TSH. - TSH; Future  I offered the patient prevnar 13.

## 2014-01-16 ENCOUNTER — Other Ambulatory Visit: Payer: Medicare Other

## 2014-01-16 ENCOUNTER — Ambulatory Visit (INDEPENDENT_AMBULATORY_CARE_PROVIDER_SITE_OTHER): Payer: Medicare Other | Admitting: *Deleted

## 2014-01-16 DIAGNOSIS — Z23 Encounter for immunization: Secondary | ICD-10-CM | POA: Diagnosis not present

## 2014-01-16 DIAGNOSIS — E039 Hypothyroidism, unspecified: Secondary | ICD-10-CM

## 2014-01-16 DIAGNOSIS — E785 Hyperlipidemia, unspecified: Secondary | ICD-10-CM

## 2014-01-16 DIAGNOSIS — I10 Essential (primary) hypertension: Secondary | ICD-10-CM

## 2014-01-16 LAB — TSH: TSH: 0.758 u[IU]/mL (ref 0.350–4.500)

## 2014-01-16 LAB — COMPLETE METABOLIC PANEL WITH GFR
ALT: 14 U/L (ref 0–35)
AST: 17 U/L (ref 0–37)
Albumin: 4 g/dL (ref 3.5–5.2)
Alkaline Phosphatase: 69 U/L (ref 39–117)
BUN: 22 mg/dL (ref 6–23)
CO2: 27 mEq/L (ref 19–32)
Calcium: 9.4 mg/dL (ref 8.4–10.5)
Chloride: 102 mEq/L (ref 96–112)
Creat: 0.66 mg/dL (ref 0.50–1.10)
GFR, Est African American: >89 mL/min
GFR, Est Non African American: 89 mL/min
Glucose, Bld: 81 mg/dL (ref 70–99)
Potassium: 4.6 mEq/L (ref 3.5–5.3)
Sodium: 139 mEq/L (ref 135–145)
Total Bilirubin: 0.3 mg/dL (ref 0.2–1.2)
Total Protein: 6.4 g/dL (ref 6.0–8.3)

## 2014-01-16 LAB — LIPID PANEL
Cholesterol: 184 mg/dL (ref 0–200)
HDL: 58 mg/dL (ref >39)
LDL Cholesterol: 97 mg/dL (ref 0–99)
Total CHOL/HDL Ratio: 3.2 Ratio
Triglycerides: 143 mg/dL (ref <150)
VLDL: 29 mg/dL (ref 0–40)

## 2014-01-20 ENCOUNTER — Encounter: Payer: Self-pay | Admitting: Family Medicine

## 2014-02-05 DIAGNOSIS — Z961 Presence of intraocular lens: Secondary | ICD-10-CM | POA: Diagnosis not present

## 2014-02-05 DIAGNOSIS — H26499 Other secondary cataract, unspecified eye: Secondary | ICD-10-CM | POA: Diagnosis not present

## 2014-02-05 DIAGNOSIS — H524 Presbyopia: Secondary | ICD-10-CM | POA: Diagnosis not present

## 2014-02-05 DIAGNOSIS — H40019 Open angle with borderline findings, low risk, unspecified eye: Secondary | ICD-10-CM | POA: Diagnosis not present

## 2014-02-25 DIAGNOSIS — H26499 Other secondary cataract, unspecified eye: Secondary | ICD-10-CM | POA: Diagnosis not present

## 2014-03-06 ENCOUNTER — Telehealth: Payer: Self-pay | Admitting: Family Medicine

## 2014-03-06 DIAGNOSIS — R0789 Other chest pain: Principal | ICD-10-CM

## 2014-03-06 DIAGNOSIS — G8929 Other chronic pain: Secondary | ICD-10-CM

## 2014-03-06 MED ORDER — PREDNISONE 20 MG PO TABS: 20.0000 mg | ORAL_TABLET | Freq: Every day | ORAL | Status: DC

## 2014-03-06 MED ORDER — PREDNISONE 20 MG PO TABS: ORAL_TABLET | ORAL | Status: DC

## 2014-03-06 NOTE — Telephone Encounter (Signed)
[phone] [facility] [place]  Pt is needing a refill on her prednisone she states Dr Dennard Schaumann has gave it to her before. She is staying with her dad at night and taking care of him her neck has a lot of tension and is inflamed. She has took some pain pills and muscle relaxers and that is not working and would like to have something else.

## 2014-03-06 NOTE — Telephone Encounter (Signed)
Med sent to pharm and pt aware 

## 2014-03-06 NOTE — Telephone Encounter (Signed)
Ok with prednisone taper pack.

## 2014-03-11 DIAGNOSIS — H26499 Other secondary cataract, unspecified eye: Secondary | ICD-10-CM | POA: Diagnosis not present

## 2014-03-14 ENCOUNTER — Other Ambulatory Visit: Payer: Self-pay | Admitting: Family Medicine

## 2014-03-18 ENCOUNTER — Other Ambulatory Visit: Payer: Self-pay | Admitting: Family Medicine

## 2014-04-10 ENCOUNTER — Other Ambulatory Visit: Payer: Self-pay

## 2014-04-10 DIAGNOSIS — Z1231 Encounter for screening mammogram for malignant neoplasm of breast: Secondary | ICD-10-CM

## 2014-04-14 ENCOUNTER — Ambulatory Visit
Admission: RE | Admit: 2014-04-14 | Discharge: 2014-04-14 | Disposition: A | Discharge status: Home / Self Care | Payer: Medicare Other | Source: Ambulatory Visit

## 2014-04-14 DIAGNOSIS — Z1231 Encounter for screening mammogram for malignant neoplasm of breast: Secondary | ICD-10-CM | POA: Diagnosis not present

## 2014-04-23 IMAGING — CR DG CHEST 2V
2 series · 2 of 2 positions shown · non-contrast
Comparison: DG CERVICAL SPINE WITH FLEX & EXTEND dated 04/15/2013;

CLINICAL DATA: Chest pain. History of lung cancer and left
thoracotomy.

EXAM:
CHEST  2 VIEW

[w chest pa]
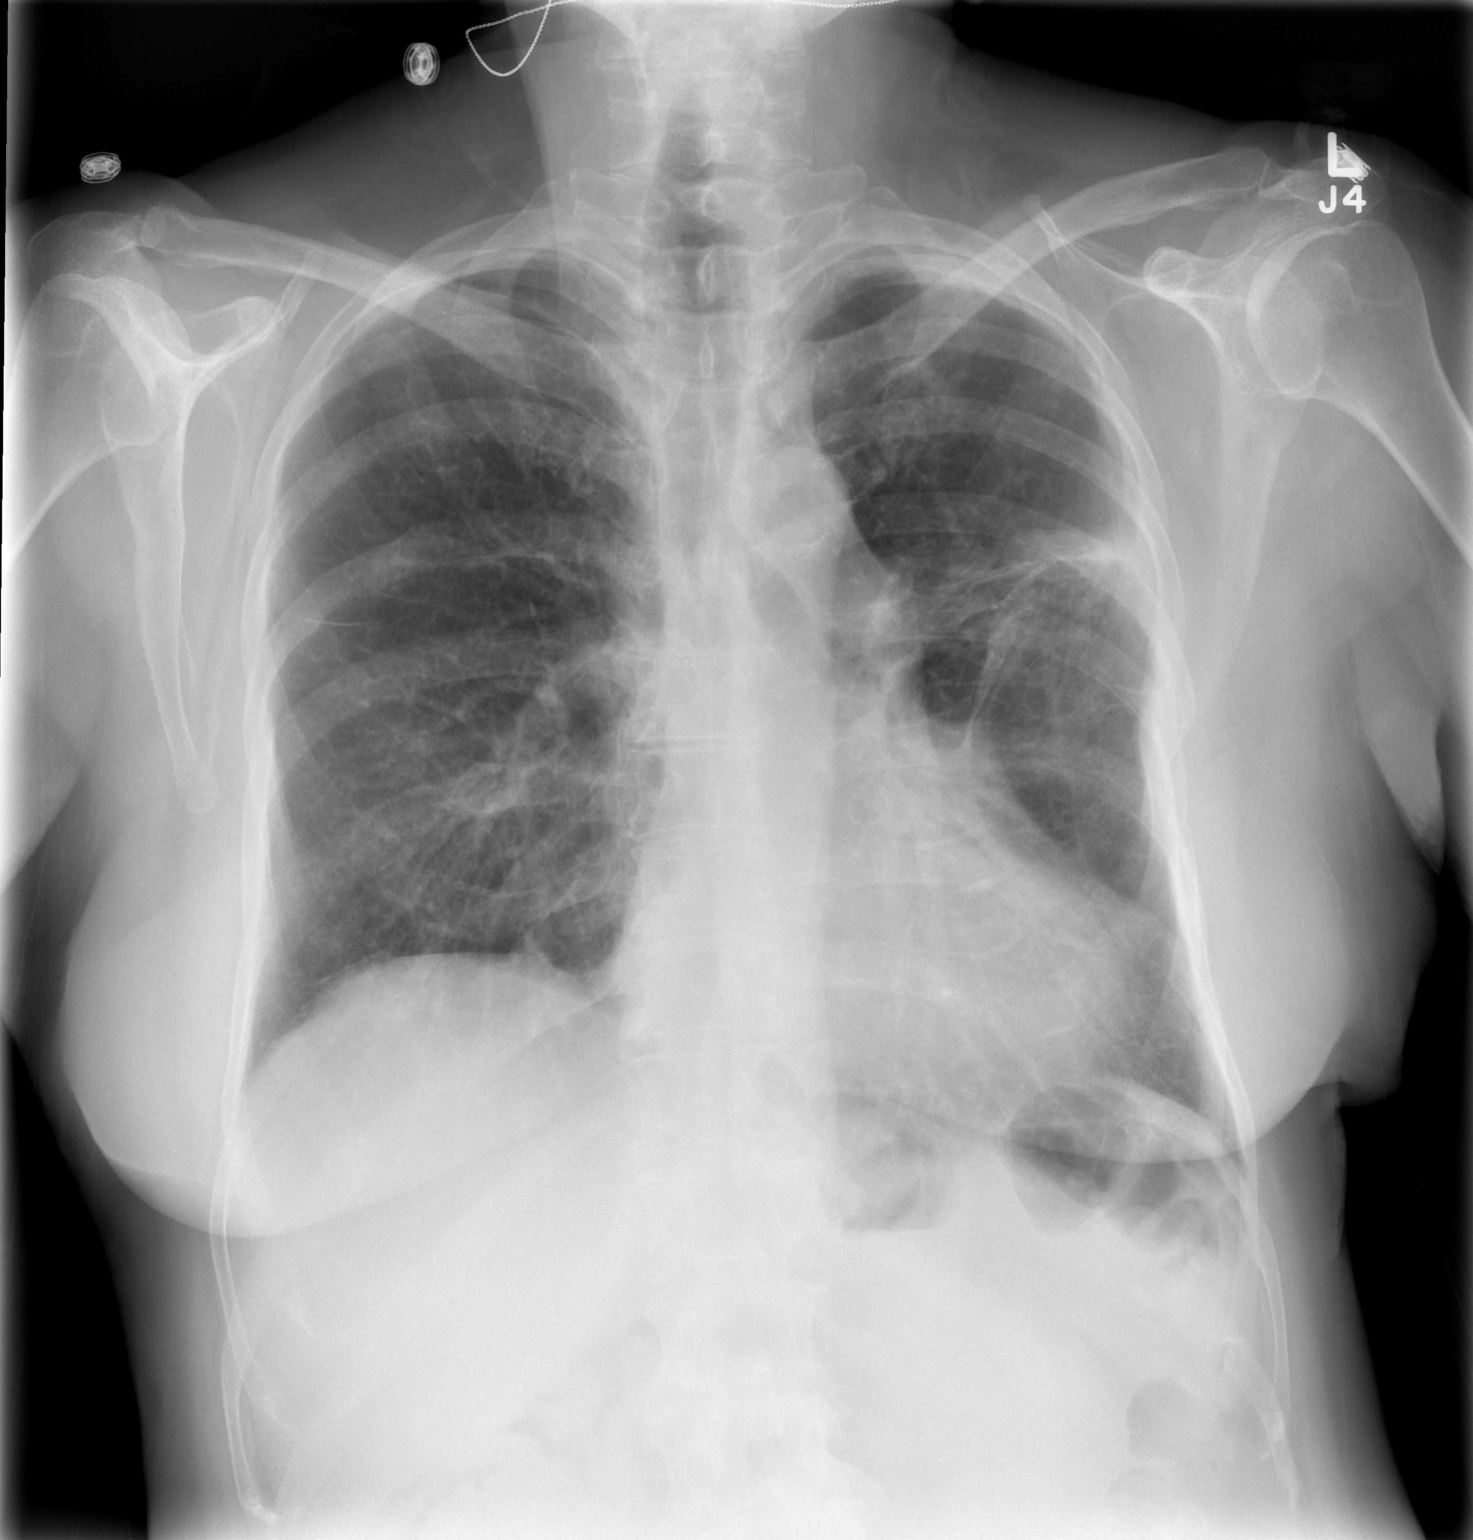

[w chest lat]
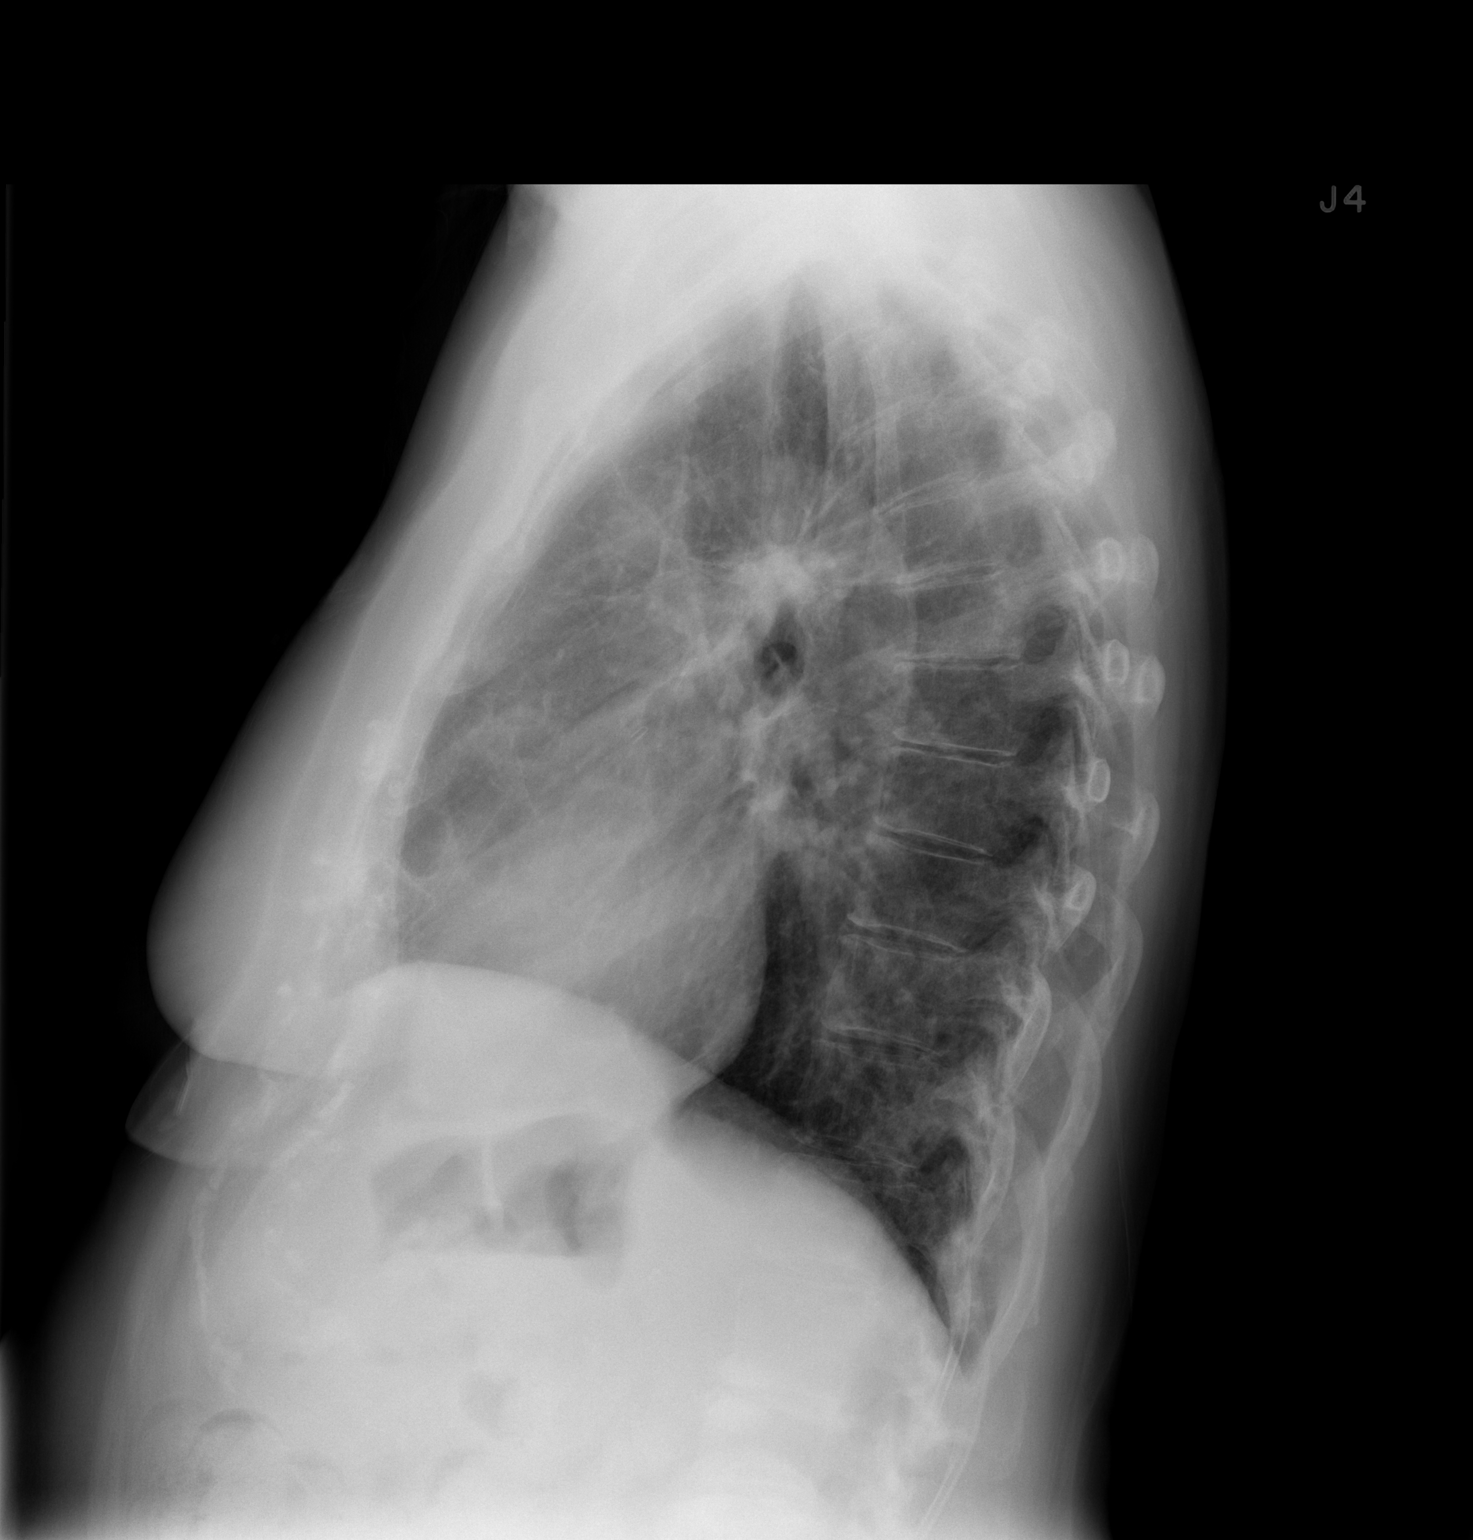

[2 of 2 positions shown; findings below may reference images not displayed]

CT ANGIO CHEST W/CM &/OR WO/CM dated 06/26/2012; DG CHEST 2 VIEW
dated 01/31/2012
FINDINGS: The heart size and mediastinal contours are stable without evidence
of adenopathy. There is new mild left perihilar distortion. The
previously demonstrated left upper lobe nodule now appears partially
obscured by adjacent pleural parenchymal density. Interval
enlargement of the nodule cannot be excluded, especially if there
has not been interval surgery. The right lung appears clear. There
is no pleural effusion or pneumothorax. Old thoracotomy defects are
present bilaterally.
IMPRESSION: New left perihilar distortion and pleural parenchymal density,
partially obscuring a previously demonstrated nodule. If this has
not been previously resected or evaluated by interval CT, follow up
CT would be recommended to exclude local recurrence.

## 2014-04-28 DIAGNOSIS — R918 Other nonspecific abnormal finding of lung field: Secondary | ICD-10-CM | POA: Diagnosis not present

## 2014-04-28 DIAGNOSIS — C3490 Malignant neoplasm of unspecified part of unspecified bronchus or lung: Secondary | ICD-10-CM | POA: Diagnosis not present

## 2014-04-28 DIAGNOSIS — Z09 Encounter for follow-up examination after completed treatment for conditions other than malignant neoplasm: Secondary | ICD-10-CM | POA: Diagnosis not present

## 2014-04-28 DIAGNOSIS — Z85118 Personal history of other malignant neoplasm of bronchus and lung: Secondary | ICD-10-CM | POA: Diagnosis not present

## 2014-05-29 ENCOUNTER — Encounter: Payer: Self-pay | Admitting: Family Medicine

## 2014-05-29 ENCOUNTER — Ambulatory Visit (INDEPENDENT_AMBULATORY_CARE_PROVIDER_SITE_OTHER): Payer: Medicare Other | Admitting: Family Medicine

## 2014-05-29 VITALS — BP 130/70 | HR 72 | Temp 98.1°F | Resp 14 | Ht 67.5 in | Wt 158.0 lb

## 2014-05-29 DIAGNOSIS — Z23 Encounter for immunization: Secondary | ICD-10-CM

## 2014-05-29 DIAGNOSIS — L259 Unspecified contact dermatitis, unspecified cause: Secondary | ICD-10-CM | POA: Diagnosis not present

## 2014-05-29 MED ORDER — MOMETASONE FUROATE 0.1 % EX OINT: TOPICAL_OINTMENT | Freq: Every day | CUTANEOUS | Status: DC

## 2014-05-29 NOTE — Progress Notes (Signed)
Subjective:    Patient ID: [name], female    DOB: 1940/11/08, 73 y.o.   MRN: [id number]  HPI  Patient has a rash on the dorsal left forearm that has been there for approximately one month. It is raised and linear. It is pink and violaceous. It is approximately 5 cm x 0.5 cm. It appears to be scar tissue versus linear lichen planus. Past Medical History  Diagnosis Date  . Allergic rhinitis   . Anemia   . Diverticula of colon   . Gastric ulcer with hemorrhage   . Esophagitis   . Arrhythmia   . Aseptic necrosis   . Basal cell cancer   . Osteoporosis   . Hyperlipidemia   . Anxiety   . Depression   . Lung cancer   . COPD (chronic obstructive pulmonary disease)   . GERD (gastroesophageal reflux disease)   . Hypertension   . Kidney stones   . Pneumonia    Current Outpatient Prescriptions on File Prior to Visit  Medication Sig Dispense Refill  . ALPRAZolam (XANAX) 0.5 MG tablet TAKE 1 TABLET BY MOUTH AT BEDTIME AS NEEDED  30 tablet  1  . aspirin 81 MG tablet Take 81 mg by mouth every other day.       . b complex-C-folic acid 1 MG capsule Take 1 capsule by mouth daily.        Marland Kitchen buPROPion (WELLBUTRIN XL) 150 MG 24 hr tablet TAKE 1 TABLET DAILY  90 tablet  2  . DULoxetine (CYMBALTA) 60 MG capsule TAKE 1 CAPSULE DAILY  90 capsule  3  . fluticasone (FLONASE) 50 MCG/ACT nasal spray Place 2 sprays into both nostrils daily.  16 g  6  . HYDROcodone-acetaminophen (NORCO) 5-325 MG per tablet Take 1 tablet by mouth every 6 (six) hours as needed for moderate pain.  30 tablet  0  . levothyroxine (SYNTHROID, LEVOTHROID) 100 MCG tablet TAKE 1 TABLET EVERY MORNING  90 tablet  2  . lisinopril-hydrochlorothiazide (PRINZIDE,ZESTORETIC) 10-12.5 MG per tablet TAKE 1 TABLET DAILY  90 tablet  3  . loratadine (CLARITIN) 10 MG tablet TAKE 1 TABLET DAILY  90 tablet  3  . omeprazole (PRILOSEC) 20 MG capsule TAKE 1 CAPSULE DAILY  90 capsule  3  . oxyCODONE-acetaminophen (PERCOCET/ROXICET) 5-325 MG  per tablet Take 1-2 tablets by mouth every 6 (six) hours as needed for severe pain.  20 tablet  0  . simvastatin (ZOCOR) 80 MG tablet Take 1 tablet (80 mg total) by mouth at bedtime.  90 tablet  4  . tiotropium (SPIRIVA HANDIHALER) 18 MCG inhalation capsule Place 1 capsule (18 mcg total) into inhaler and inhale daily.  30 capsule  11  . traZODone (DESYREL) 50 MG tablet Take 50 mg by mouth at bedtime as needed for sleep.       Marland Kitchen VITAMIN D, CHOLECALCIFEROL, PO Take 1 tablet by mouth daily.       No current facility-administered medications on file prior to visit.   Allergies  Allergen Reactions  . Doxycycline Nausea Only  . Erythromycin Nausea Only  . Sulfonamide Derivatives Hives   History   Social History  . Marital Status: Married    Spouse Name: N/A    Number of Children: 3  . Years of Education: N/A   Occupational History  . retired Marine scientist    Social History Main Topics  . Smoking status: Former Smoker -- 0.50 packs/day for 25 years    Types: Cigarettes  Quit date: 09/11/2004  . Smokeless tobacco: Never Used  . Alcohol Use: Yes     Comment: social  . Drug Use: No  . Sexual Activity: Not on file   Other Topics Concern  . Not on file   Social History Narrative  . No narrative on file     Review of Systems  All other systems reviewed and are negative.      Objective:   Physical Exam  Vitals reviewed. Cardiovascular: Normal rate and regular rhythm.   Pulmonary/Chest: Effort normal and breath sounds normal.  Skin: Rash noted.          Assessment & Plan:  Contact dermatitis - Plan: mometasone (ELOCON) 0.1 % ointment, DISCONTINUED: mometasone (ELOCON) 0.1 % ointment  Need for prophylactic vaccination and inoculation against influenza - Plan: Flu Vaccine QUAD 36+ mos IM  Contact dermatitis versus lichen planus. Apply Elocon ointment once daily for 2 weeks. Return for biopsy if rash persists.

## 2014-06-03 ENCOUNTER — Telehealth: Payer: Self-pay | Admitting: *Deleted

## 2014-06-03 NOTE — Telephone Encounter (Signed)
Received fax requesting refill on Xanax.   Ok to refill??  Last office visit 05/29/2014.  Last refill 12/29/2013, #1 refill.

## 2014-06-04 MED ORDER — ALPRAZOLAM 0.5 MG PO TABS: ORAL_TABLET | ORAL | Status: DC

## 2014-06-04 NOTE — Telephone Encounter (Signed)
Medication called to pharmacy. 

## 2014-06-04 NOTE — Telephone Encounter (Signed)
ok 

## 2014-06-05 ENCOUNTER — Other Ambulatory Visit: Payer: Self-pay | Admitting: *Deleted

## 2014-06-08 ENCOUNTER — Ambulatory Visit (INDEPENDENT_AMBULATORY_CARE_PROVIDER_SITE_OTHER): Payer: Medicare Other | Admitting: Family Medicine

## 2014-06-08 ENCOUNTER — Encounter: Payer: Self-pay | Admitting: Family Medicine

## 2014-06-08 VITALS — BP 110/78 | HR 74 | Temp 98.6°F | Resp 18 | Ht 67.5 in | Wt 154.0 lb

## 2014-06-08 DIAGNOSIS — F3289 Other specified depressive episodes: Secondary | ICD-10-CM | POA: Diagnosis not present

## 2014-06-08 DIAGNOSIS — F329 Major depressive disorder, single episode, unspecified: Secondary | ICD-10-CM | POA: Diagnosis not present

## 2014-06-08 DIAGNOSIS — F32A Depression, unspecified: Secondary | ICD-10-CM
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
## 2014-06-26 ENCOUNTER — Telehealth: Payer: Self-pay | Admitting: Family Medicine

## 2014-06-26 NOTE — Telephone Encounter (Signed)
Patient says she needs [facility] [phone] of her spiriva and and flonase nasal spray  If questions for her call [phone]

## 2014-06-29 MED ORDER — FLUTICASONE PROPIONATE 50 MCG/ACT NA SUSP: 2.0000 | Freq: Every day | NASAL | Status: DC

## 2014-06-29 MED ORDER — TIOTROPIUM BROMIDE MONOHYDRATE 18 MCG IN CAPS: 18.0000 ug | ORAL_CAPSULE | Freq: Every day | RESPIRATORY_TRACT | Status: DC

## 2014-06-29 NOTE — Telephone Encounter (Signed)
Med sent to pharm 

## 2014-07-23 ENCOUNTER — Telehealth: Payer: Self-pay | Admitting: Family Medicine

## 2014-07-23 DIAGNOSIS — Z79899 Other long term (current) drug therapy: Secondary | ICD-10-CM

## 2014-07-23 NOTE — Telephone Encounter (Signed)
Medication for fungus?

## 2014-07-23 NOTE — Telephone Encounter (Signed)
lamisil 250 mg poqday (30).  recheck cmp in 1 month.

## 2014-07-23 NOTE — Telephone Encounter (Signed)
Pt is needing a medication to cure toe nail fungus. PT is needing fluticasone (FLONASE) 50 MCG/ACT nasal spray ( 90 day supply) [facility] ([facility] supply[facility]   [phone]

## 2014-07-24 MED ORDER — TIOTROPIUM BROMIDE MONOHYDRATE 18 MCG IN CAPS: 18.0000 ug | ORAL_CAPSULE | Freq: Every day | RESPIRATORY_TRACT | Status: DC

## 2014-07-24 MED ORDER — TERBINAFINE HCL 250 MG PO TABS: 250.0000 mg | ORAL_TABLET | Freq: Every day | ORAL | Status: DC

## 2014-07-24 MED ORDER — FLUTICASONE PROPIONATE 50 MCG/ACT NA SUSP: 2.0000 | Freq: Every day | NASAL | Status: DC

## 2014-07-24 NOTE — Telephone Encounter (Signed)
Pt aware the need for BW on Lamisil and med sent to pharm[facility]. [facility] by [facility].

## 2014-08-05 ENCOUNTER — Telehealth: Payer: Self-pay | Admitting: Family Medicine

## 2014-08-05 NOTE — Telephone Encounter (Signed)
[phone]  Pt states she uses a antibiotic when she goes to the dentist and she has an appointment on the 1st and she is out of the antibiotic and would like to have a refill on it. I could not understand what she was saying it was but it was 500 MG   [facility]

## 2014-08-05 NOTE — Telephone Encounter (Signed)
This is not on her medicine list, can you give me med and sig please.

## 2014-08-10 MED ORDER — AMOXICILLIN 500 MG PO CAPS
ORAL_CAPSULE | ORAL | Status: DC
Start: 2014-08-10 — End: 2014-11-27

## 2014-08-10 NOTE — Telephone Encounter (Signed)
Pt is not allergic to PCN and med sent to pharm

## 2014-08-10 NOTE — Telephone Encounter (Signed)
Amoxicillin 2 g po 1 hr prior x 1 (make sure she is not pcn allergic).

## 2014-09-22 ENCOUNTER — Other Ambulatory Visit: Payer: Self-pay | Admitting: Family Medicine

## 2014-09-22 NOTE — Telephone Encounter (Signed)
Refill appropriate and filled per protocol. 

## 2014-09-29 DIAGNOSIS — Z87891 Personal history of nicotine dependence: Secondary | ICD-10-CM | POA: Diagnosis not present

## 2014-09-29 DIAGNOSIS — C3400 Malignant neoplasm of unspecified main bronchus: Secondary | ICD-10-CM | POA: Diagnosis not present

## 2014-09-29 DIAGNOSIS — C3490 Malignant neoplasm of unspecified part of unspecified bronchus or lung: Secondary | ICD-10-CM | POA: Diagnosis not present

## 2014-09-29 DIAGNOSIS — R918 Other nonspecific abnormal finding of lung field: Secondary | ICD-10-CM | POA: Diagnosis not present

## 2014-09-29 DIAGNOSIS — Z08 Encounter for follow-up examination after completed treatment for malignant neoplasm: Secondary | ICD-10-CM | POA: Diagnosis not present

## 2014-09-29 DIAGNOSIS — C3412 Malignant neoplasm of upper lobe, left bronchus or lung: Secondary | ICD-10-CM | POA: Diagnosis not present

## 2014-09-29 DIAGNOSIS — Z85118 Personal history of other malignant neoplasm of bronchus and lung: Secondary | ICD-10-CM

## 2014-09-29 DIAGNOSIS — Z902 Acquired absence of lung [part of]: Secondary | ICD-10-CM | POA: Diagnosis not present

## 2014-09-30 ENCOUNTER — Other Ambulatory Visit: Payer: Self-pay | Admitting: *Deleted

## 2014-09-30 ENCOUNTER — Other Ambulatory Visit: Payer: Self-pay | Admitting: Family Medicine

## 2014-09-30 NOTE — Telephone Encounter (Signed)
Ok to refill??  Last office visit 06/08/2014.  Last refill Xanax 06/04/2014, #1 refill.  Last refill Flexeril 12/29/2013.

## 2014-10-01 NOTE — Telephone Encounter (Signed)
Ok x 2  

## 2014-10-01 NOTE — Telephone Encounter (Signed)
Medication called to pharmacy. 

## 2014-10-14 ENCOUNTER — Other Ambulatory Visit: Payer: Self-pay | Admitting: Emergency Medicine

## 2014-10-28 ENCOUNTER — Other Ambulatory Visit: Payer: Self-pay | Admitting: Family Medicine

## 2014-11-07 ENCOUNTER — Other Ambulatory Visit: Payer: Self-pay | Admitting: Family Medicine

## 2014-11-27 ENCOUNTER — Ambulatory Visit (INDEPENDENT_AMBULATORY_CARE_PROVIDER_SITE_OTHER): Payer: Medicare Other | Admitting: Family Medicine

## 2014-11-27 ENCOUNTER — Ambulatory Visit
Admission: RE | Admit: 2014-11-27 | Discharge: 2014-11-27 | Disposition: A | Discharge status: Home / Self Care | Payer: Medicare Other | Source: Ambulatory Visit | Attending: Family Medicine | Admitting: Family Medicine

## 2014-11-27 ENCOUNTER — Encounter: Payer: Self-pay | Admitting: Family Medicine

## 2014-11-27 VITALS — BP 142/82 | HR 76 | Temp 98.0°F | Resp 20 | Ht 67.5 in | Wt 156.0 lb

## 2014-11-27 DIAGNOSIS — J019 Acute sinusitis, unspecified: Secondary | ICD-10-CM

## 2014-11-27 DIAGNOSIS — M545 Low back pain, unspecified: Secondary | ICD-10-CM

## 2014-11-27 DIAGNOSIS — M47816 Spondylosis without myelopathy or radiculopathy, lumbar region: Secondary | ICD-10-CM | POA: Diagnosis not present

## 2014-11-27 MED ORDER — CEFDINIR 300 MG PO CAPS: 300.0000 mg | ORAL_CAPSULE | Freq: Two times a day (BID) | ORAL | Status: DC

## 2014-11-27 NOTE — Progress Notes (Signed)
Subjective:    Patient ID: [name], female    DOB: [birth year], 74 y.o.   MRN: [id number]  HPI Patient has had 2 weeks of pain in her frontal and maxillary sinuses. She has postnasal drip and rhinorrhea. She has pain behind her eyeballs. She has pain in the roof of her mouth. She is tried Sudafed, Zyrtec, Claritin, Flonase, and Afrin without relief. She was admitted recently in the hospital in [place] for pneumonia and was treated with carbapenem.  This was in early February. She denies any cough or chest pain or shortness of breath. She does report midline low back pain in her lumbar spine without radiation of the pain into her legs. Past Medical History  Diagnosis Date  . Allergic rhinitis   . Anemia   . Diverticula of colon   . Gastric ulcer with hemorrhage   . Esophagitis   . Arrhythmia   . Aseptic necrosis   . Basal cell cancer   . Osteoporosis   . Hyperlipidemia   . Anxiety   . Depression   . Lung cancer   . COPD (chronic obstructive pulmonary disease)   . GERD (gastroesophageal reflux disease)   . Hypertension   . Kidney stones   . Pneumonia    Past Surgical History  Procedure Laterality Date  . Laparoscopic salpingoopherectomy    . Thoracotomy    . Mitral valve replacement     Current Outpatient Prescriptions on File Prior to Visit  Medication Sig Dispense Refill  . ALPRAZolam (XANAX) 0.5 MG tablet TAKE 1 TABLET AT BEDTIME AS NEEDED 30 tablet 1  . aspirin 81 MG tablet Take 81 mg by mouth every other day.     . b complex-C-folic acid 1 MG capsule Take 1 capsule by mouth daily.      Marland Kitchen buPROPion (WELLBUTRIN XL) 150 MG 24 hr tablet TAKE 1 TABLET DAILY 90 tablet 4  . cyclobenzaprine (FLEXERIL) 10 MG tablet TAKE 1 TABLET BY MOUTH AT BEDTIME 90 tablet 0  . DULoxetine (CYMBALTA) 60 MG capsule TAKE 1 CAPSULE DAILY 90 capsule 3  . fluticasone (FLONASE) 50 MCG/ACT nasal spray Place 2 sprays into both nostrils daily. 48 g 3  . HYDROcodone-acetaminophen (NORCO) 5-325 MG  per tablet Take 1 tablet by mouth every 6 (six) hours as needed for moderate pain. 30 tablet 0  . levothyroxine (SYNTHROID, LEVOTHROID) 100 MCG tablet TAKE 1 TABLET EVERY MORNING 90 tablet 1  . lisinopril-hydrochlorothiazide (PRINZIDE,ZESTORETIC) 10-12.5 MG per tablet TAKE 1 TABLET DAILY 90 tablet 2  . loratadine (CLARITIN) 10 MG tablet TAKE 1 TABLET DAILY 90 tablet 3  . mometasone (ELOCON) 0.1 % ointment Apply topically daily. 45 g 0  . omeprazole (PRILOSEC) 20 MG capsule TAKE 1 CAPSULE DAILY 90 capsule 3  . oxyCODONE-acetaminophen (PERCOCET/ROXICET) 5-325 MG per tablet Take 1-2 tablets by mouth every 6 (six) hours as needed for severe pain. 20 tablet 0  . simvastatin (ZOCOR) 80 MG tablet Take 1 tablet (80 mg total) by mouth at bedtime. 90 tablet 4  . tiotropium (SPIRIVA HANDIHALER) 18 MCG inhalation capsule Place 1 capsule (18 mcg total) into inhaler and inhale daily. 90 capsule 3  . traZODone (DESYREL) 50 MG tablet Take 50 mg by mouth at bedtime as needed for sleep.     Marland Kitchen VITAMIN D, CHOLECALCIFEROL, PO Take 1 tablet by mouth daily.     No current facility-administered medications on file prior to visit.   Allergies  Allergen Reactions  . Doxycycline Nausea Only  .  Erythromycin Nausea Only  . Sulfonamide Derivatives Hives   History   Social History  . Marital Status: Married    Spouse Name: N/A  . Number of Children: 3  . Years of Education: N/A   Occupational History  . retired Marine scientist    Social History Main Topics  . Smoking status: Former Smoker -- 0.50 packs/day for 25 years    Types: Cigarettes    Quit date: 09/11/2004  . Smokeless tobacco: Never Used  . Alcohol Use: Yes     Comment: social  . Drug Use: No  . Sexual Activity: Not on file   Other Topics Concern  . Not on file   Social History Narrative      Review of Systems  All other systems reviewed and are negative.      Objective:   Physical Exam  HENT:  Right Ear: Tympanic membrane, external ear and  ear canal normal.  Left Ear: Tympanic membrane, external ear and ear canal normal.  Nose: Mucosal edema and rhinorrhea present. Right sinus exhibits maxillary sinus tenderness and frontal sinus tenderness. Left sinus exhibits maxillary sinus tenderness and frontal sinus tenderness.  Mouth/Throat: Oropharynx is clear and moist. No oropharyngeal exudate.  Eyes: Conjunctivae are normal. Pupils are equal, round, and reactive to light.  Neck: Neck supple.  Cardiovascular: Normal rate and regular rhythm.   Murmur heard. Pulmonary/Chest: Effort normal and breath sounds normal. No respiratory distress. She has no wheezes. She has no rales. She exhibits no tenderness.  Abdominal: Soft. Bowel sounds are normal.  Lymphadenopathy:    She has no cervical adenopathy.  Vitals reviewed.         Assessment & Plan:  Acute rhinosinusitis - Plan: cefdinir (OMNICEF) 300 MG capsule  Midline low back pain without sciatica - Plan: DG Lumbar Spine Complete  Begin Omnicef 300 mg by mouth twice a day. Also add nasal saline 4 times a day. Continue Flonase and discontinued other over-the-counter medication. I will also obtain an x-ray of the lumbar spine to evaluate for potential causes of her midline low back pain.

## 2014-12-01 ENCOUNTER — Other Ambulatory Visit: Payer: Self-pay | Admitting: Family Medicine

## 2014-12-01 MED ORDER — CELECOXIB 200 MG PO CAPS: 200.0000 mg | ORAL_CAPSULE | Freq: Every day | ORAL | Status: DC

## 2014-12-07 ENCOUNTER — Other Ambulatory Visit: Payer: Self-pay | Admitting: Family Medicine

## 2014-12-07 NOTE — Telephone Encounter (Signed)
Medication refilled per protocol. 

## 2014-12-16 ENCOUNTER — Telehealth: Payer: Self-pay | Admitting: Family Medicine

## 2014-12-16 MED ORDER — CELECOXIB 200 MG PO CAPS: 200.0000 mg | ORAL_CAPSULE | Freq: Every day | ORAL | Status: DC

## 2014-12-16 NOTE — Telephone Encounter (Signed)
[facility]  [phone]  Patient calling to say can you please [facility], [facility] will pay for if this is done

## 2014-12-16 NOTE — Telephone Encounter (Signed)
Med sent to pharm 

## 2014-12-30 ENCOUNTER — Telehealth: Payer: Self-pay | Admitting: Family Medicine

## 2014-12-30 NOTE — Telephone Encounter (Signed)
?   OK to Refill  

## 2014-12-30 NOTE — Telephone Encounter (Signed)
[facility] [phone] PT is needing a refill on Valacyclovir HCL 1 Gram T Her dermatologist had prescribed it but her cold sores keep coming back

## 2014-12-31 MED ORDER — VALACYCLOVIR HCL 1 G PO TABS: 2000.0000 mg | ORAL_TABLET | Freq: Two times a day (BID) | ORAL | Status: DC

## 2014-12-31 NOTE — Telephone Encounter (Signed)
ok 

## 2014-12-31 NOTE — Telephone Encounter (Signed)
Medication called/sent to requested pharmacy  

## 2015-03-01 ENCOUNTER — Other Ambulatory Visit: Payer: Self-pay | Admitting: Family Medicine

## 2015-03-08 ENCOUNTER — Other Ambulatory Visit: Payer: Self-pay

## 2015-03-17 ENCOUNTER — Other Ambulatory Visit: Payer: Self-pay

## 2015-03-17 DIAGNOSIS — Z1231 Encounter for screening mammogram for malignant neoplasm of breast: Secondary | ICD-10-CM

## 2015-03-27 ENCOUNTER — Other Ambulatory Visit: Payer: Self-pay | Admitting: Family Medicine

## 2015-03-30 DIAGNOSIS — Z87891 Personal history of nicotine dependence: Secondary | ICD-10-CM | POA: Diagnosis not present

## 2015-03-30 DIAGNOSIS — R0602 Shortness of breath: Secondary | ICD-10-CM | POA: Diagnosis not present

## 2015-03-30 DIAGNOSIS — C3411 Malignant neoplasm of upper lobe, right bronchus or lung: Secondary | ICD-10-CM | POA: Diagnosis not present

## 2015-03-30 DIAGNOSIS — Z08 Encounter for follow-up examination after completed treatment for malignant neoplasm: Secondary | ICD-10-CM | POA: Diagnosis not present

## 2015-03-30 DIAGNOSIS — D751 Secondary polycythemia: Secondary | ICD-10-CM | POA: Diagnosis not present

## 2015-03-30 DIAGNOSIS — C3412 Malignant neoplasm of upper lobe, left bronchus or lung: Secondary | ICD-10-CM | POA: Diagnosis not present

## 2015-03-30 DIAGNOSIS — C3400 Malignant neoplasm of unspecified main bronchus: Secondary | ICD-10-CM | POA: Diagnosis not present

## 2015-03-30 DIAGNOSIS — Z9889 Other specified postprocedural states: Secondary | ICD-10-CM | POA: Diagnosis not present

## 2015-03-30 DIAGNOSIS — R918 Other nonspecific abnormal finding of lung field: Secondary | ICD-10-CM | POA: Diagnosis not present

## 2015-04-02 ENCOUNTER — Telehealth: Payer: Self-pay | Admitting: Family Medicine

## 2015-04-02 NOTE — Telephone Encounter (Signed)
Patient is calling to say that her simvastatin needs to be 40 mg instead of 80 mg  [facility]  Fax number is [phone]

## 2015-04-05 MED ORDER — SIMVASTATIN 40 MG PO TABS: 40.0000 mg | ORAL_TABLET | Freq: Every day | ORAL | Status: DC

## 2015-04-05 NOTE — Telephone Encounter (Signed)
Medication called/sent to requested pharmacy  

## 2015-04-07 DIAGNOSIS — L299 Pruritus, unspecified: Secondary | ICD-10-CM | POA: Diagnosis not present

## 2015-04-07 DIAGNOSIS — L570 Actinic keratosis: Secondary | ICD-10-CM | POA: Diagnosis not present

## 2015-04-07 DIAGNOSIS — D239 Other benign neoplasm of skin, unspecified: Secondary | ICD-10-CM | POA: Diagnosis not present

## 2015-04-20 ENCOUNTER — Institutional Professional Consult (permissible substitution): Payer: Medicare Other | Admitting: Emergency Medicine

## 2015-04-21 ENCOUNTER — Ambulatory Visit
Admission: RE | Admit: 2015-04-21 | Discharge: 2015-04-21 | Disposition: A | Discharge status: Home / Self Care | Payer: Medicare Other | Source: Ambulatory Visit

## 2015-04-21 DIAGNOSIS — Z1231 Encounter for screening mammogram for malignant neoplasm of breast: Secondary | ICD-10-CM

## 2015-04-24 ENCOUNTER — Other Ambulatory Visit: Payer: Self-pay | Admitting: Family Medicine

## 2015-04-29 DIAGNOSIS — R0602 Shortness of breath: Secondary | ICD-10-CM | POA: Diagnosis not present

## 2015-04-29 DIAGNOSIS — R0902 Hypoxemia: Secondary | ICD-10-CM | POA: Diagnosis not present

## 2015-05-12 DIAGNOSIS — R0602 Shortness of breath: Secondary | ICD-10-CM | POA: Diagnosis not present

## 2015-05-25 ENCOUNTER — Encounter: Payer: Self-pay | Admitting: Family Medicine

## 2015-05-25 ENCOUNTER — Ambulatory Visit (INDEPENDENT_AMBULATORY_CARE_PROVIDER_SITE_OTHER): Payer: Medicare Other | Admitting: Family Medicine

## 2015-05-25 VITALS — BP 130/78 | HR 78 | Temp 98.2°F | Resp 24 | Ht 67.5 in | Wt 152.0 lb

## 2015-05-25 DIAGNOSIS — H109 Unspecified conjunctivitis: Secondary | ICD-10-CM

## 2015-05-25 MED ORDER — ERYTHROMYCIN 5 MG/GM OP OINT: 1.0000 "application " | TOPICAL_OINTMENT | Freq: Two times a day (BID) | OPHTHALMIC | Status: DC

## 2015-05-25 NOTE — Progress Notes (Signed)
Subjective:    Patient ID: [name], female    DOB: [birth year], 74 y.o.   MRN: [id number]  HPI Patient presents with pain and irritation in her right eye. Symptoms began yesterday. The conjunctiva in the right eye is erythematous. On fluorescein exam there is no abrasion or corneal ulcer. Also noted in genetic lesion seen. There are no foreign body seen. Patient denies any allergy symptoms such as sneezing or itchy watery eyes or runny nose. Patient appears to have conjunctivitis. Past Medical History  Diagnosis Date  . Allergic rhinitis   . Anemia   . Diverticula of colon   . Gastric ulcer with hemorrhage   . Esophagitis   . Arrhythmia   . Aseptic necrosis   . Basal cell cancer   . Osteoporosis   . Hyperlipidemia   . Anxiety   . Depression   . Lung cancer   . COPD (chronic obstructive pulmonary disease)   . GERD (gastroesophageal reflux disease)   . Hypertension   . Kidney stones   . Pneumonia    Past Surgical History  Procedure Laterality Date  . Laparoscopic salpingoopherectomy    . Thoracotomy    . Mitral valve replacement     Current Outpatient Prescriptions on File Prior to Visit  Medication Sig Dispense Refill  . ALPRAZolam (XANAX) 0.5 MG tablet TAKE 1 TABLET AT BEDTIME AS NEEDED 30 tablet 1  . aspirin 81 MG tablet Take 81 mg by mouth every other day.     . b complex-C-folic acid 1 MG capsule Take 1 capsule by mouth daily.      Marland Kitchen buPROPion (WELLBUTRIN XL) 150 MG 24 hr tablet TAKE 1 TABLET DAILY 90 tablet 4  . celecoxib (CELEBREX) 200 MG capsule Take 1 capsule (200 mg total) by mouth daily. 90 capsule 3  . cyclobenzaprine (FLEXERIL) 10 MG tablet TAKE 1 TABLET BY MOUTH AT BEDTIME 90 tablet 0  . DULoxetine (CYMBALTA) 60 MG capsule TAKE 1 CAPSULE DAILY 90 capsule 1  . fluticasone (FLONASE) 50 MCG/ACT nasal spray Place 2 sprays into both nostrils daily. 48 g 3  . HYDROcodone-acetaminophen (NORCO) 5-325 MG per tablet Take 1 tablet by mouth every 6 (six) hours as  needed for moderate pain. 30 tablet 0  . levothyroxine (SYNTHROID, LEVOTHROID) 100 MCG tablet TAKE 1 TABLET EVERY MORNING 90 tablet 3  . lisinopril-hydrochlorothiazide (PRINZIDE,ZESTORETIC) 10-12.5 MG per tablet TAKE 1 TABLET DAILY 90 tablet 2  . loratadine (CLARITIN) 10 MG tablet TAKE 1 TABLET DAILY 90 tablet 3  . mometasone (ELOCON) 0.1 % ointment Apply topically daily. 45 g 0  . oxyCODONE-acetaminophen (PERCOCET/ROXICET) 5-325 MG per tablet Take 1-2 tablets by mouth every 6 (six) hours as needed for severe pain. 20 tablet 0  . simvastatin (ZOCOR) 40 MG tablet Take 1 tablet (40 mg total) by mouth at bedtime. 90 tablet 3  . tiotropium (SPIRIVA HANDIHALER) 18 MCG inhalation capsule Place 1 capsule (18 mcg total) into inhaler and inhale daily. 90 capsule 3  . traZODone (DESYREL) 50 MG tablet Take 50 mg by mouth at bedtime as needed for sleep.     . valACYclovir (VALTREX) 1000 MG tablet TAKE 2 TABLETS BY MOUTH 2 TIMES A DAY 20 tablet 3  . VITAMIN D, CHOLECALCIFEROL, PO Take 1 tablet by mouth daily.     No current facility-administered medications on file prior to visit.   Allergies  Allergen Reactions  . Doxycycline Nausea Only  . Erythromycin Nausea Only  . Sulfonamide Derivatives Hives  Social History   Social History  . Marital Status: Married    Spouse Name: N/A  . Number of Children: 3  . Years of Education: N/A   Occupational History  . retired Marine scientist    Social History Main Topics  . Smoking status: Former Smoker -- 0.50 packs/day for 25 years    Types: Cigarettes    Quit date: 09/11/2004  . Smokeless tobacco: Never Used  . Alcohol Use: Yes     Comment: social  . Drug Use: No  . Sexual Activity: Not on file   Other Topics Concern  . Not on file   Social History Narrative      Review of Systems  All other systems reviewed and are negative.      Objective:   Physical Exam  Eyes: Lids are everted and swept, no foreign bodies found. Right eye exhibits no  discharge, no exudate and no hordeolum. No foreign body present in the right eye. Left eye exhibits no discharge, no exudate and no hordeolum. No foreign body present in the left eye. Right conjunctiva is injected. Right eye exhibits normal extraocular motion. Left eye exhibits normal extraocular motion.  Cardiovascular: Normal rate, regular rhythm and normal heart sounds.   Pulmonary/Chest: Effort normal and breath sounds normal.  Vitals reviewed.         Assessment & Plan:  Conjunctivitis of right eye - Plan: erythromycin ophthalmic ointment, DISCONTINUED: erythromycin ophthalmic ointment  I placed 2 drops of tetracaine into her right eye. Symptoms improved dramatically after numbing the eye. I then performed a fluoroscopy seen exam. There were no corneal abrasions, corneal ulcers, or dendritic lesions seen. Patient appears to have conjunctivitis. Therefore I will start her on erythromycin ophthalmic ointment applied twice a day to the eye for the next 5 days. Recheck in 48 hours if no better or immediately if worse. She denies any photophobia.

## 2015-05-26 ENCOUNTER — Encounter: Payer: Self-pay | Admitting: Physician Assistant

## 2015-05-26 ENCOUNTER — Ambulatory Visit (INDEPENDENT_AMBULATORY_CARE_PROVIDER_SITE_OTHER): Payer: Medicare Other | Admitting: Physician Assistant

## 2015-05-26 VITALS — BP 140/82 | HR 78 | Temp 98.6°F | Resp 20

## 2015-05-26 DIAGNOSIS — G510 Bell's palsy: Secondary | ICD-10-CM

## 2015-05-26 MED ORDER — PREDNISONE 20 MG PO TABS: ORAL_TABLET | ORAL | Status: DC

## 2015-05-26 MED ORDER — VALACYCLOVIR HCL 500 MG PO TABS: 500.0000 mg | ORAL_TABLET | Freq: Three times a day (TID) | ORAL | Status: DC

## 2015-05-26 NOTE — Progress Notes (Signed)
Patient ID: [name] MRN: [id number], DOB: 1941/02/01, 74 y.o. Date of Encounter: 05/26/2015, 1:00 PM    Chief Complaint:  Chief Complaint  Patient presents with  . Right side facial droop     HPI: 74 y.o. year old white female presents with complaints of the above.  I reviewed her note by Dr. Dennard Schaumann from yesterday. At that visit she reported irritation of her right eye. He performed fluoroscein exam and saw no abrasion or corneal ulcer. No dendritic lesion. On exam, there was no foreign body. Treated her for conjunctivitis with erythromycin ophthalmic ointment. Patient states that yesterday her right eye felt irritated as if something was in it. At that time, was having no other symptoms. Says this morning she woke up with right facial droop. Says that she has been checking this on the Internet and thinks she has Bell's palsy. Says that she has had no weakness in any of her extremities and has noticed no other neurologic changes/deficits. No other complaints or concerns.     Home Meds:   Outpatient Prescriptions Prior to Visit  Medication Sig Dispense Refill  . ALPRAZolam (XANAX) 0.5 MG tablet TAKE 1 TABLET AT BEDTIME AS NEEDED 30 tablet 1  . aspirin 81 MG tablet Take 81 mg by mouth every other day.     . b complex-C-folic acid 1 MG capsule Take 1 capsule by mouth daily.      Marland Kitchen buPROPion (WELLBUTRIN XL) 150 MG 24 hr tablet TAKE 1 TABLET DAILY 90 tablet 4  . celecoxib (CELEBREX) 200 MG capsule Take 1 capsule (200 mg total) by mouth daily. 90 capsule 3  . cyclobenzaprine (FLEXERIL) 10 MG tablet TAKE 1 TABLET BY MOUTH AT BEDTIME 90 tablet 0  . DULoxetine (CYMBALTA) 60 MG capsule TAKE 1 CAPSULE DAILY 90 capsule 1  . erythromycin ophthalmic ointment Place 1 application into the right eye 2 (two) times daily. 3.5 g 0  . fluticasone (FLONASE) 50 MCG/ACT nasal spray Place 2 sprays into both nostrils daily. 48 g 3  . HYDROcodone-acetaminophen (NORCO) 5-325 MG per tablet Take  1 tablet by mouth every 6 (six) hours as needed for moderate pain. 30 tablet 0  . levothyroxine (SYNTHROID, LEVOTHROID) 100 MCG tablet TAKE 1 TABLET EVERY MORNING 90 tablet 3  . lisinopril-hydrochlorothiazide (PRINZIDE,ZESTORETIC) 10-12.5 MG per tablet TAKE 1 TABLET DAILY 90 tablet 2  . loratadine (CLARITIN) 10 MG tablet TAKE 1 TABLET DAILY 90 tablet 3  . mometasone (ELOCON) 0.1 % ointment Apply topically daily. 45 g 0  . oxyCODONE-acetaminophen (PERCOCET/ROXICET) 5-325 MG per tablet Take 1-2 tablets by mouth every 6 (six) hours as needed for severe pain. 20 tablet 0  . simvastatin (ZOCOR) 40 MG tablet Take 1 tablet (40 mg total) by mouth at bedtime. 90 tablet 3  . tiotropium (SPIRIVA HANDIHALER) 18 MCG inhalation capsule Place 1 capsule (18 mcg total) into inhaler and inhale daily. 90 capsule 3  . traZODone (DESYREL) 50 MG tablet Take 50 mg by mouth at bedtime as needed for sleep.     . valACYclovir (VALTREX) 1000 MG tablet TAKE 2 TABLETS BY MOUTH 2 TIMES A DAY 20 tablet 3  . VITAMIN D, CHOLECALCIFEROL, PO Take 1 tablet by mouth daily.     No facility-administered medications prior to visit.    Allergies:  Allergies  Allergen Reactions  . Doxycycline Nausea Only  . Erythromycin Nausea Only  . Sulfonamide Derivatives Hives      Review of Systems: See HPI for pertinent  ROS. All other ROS negative.    Physical Exam: Blood pressure 140/82, pulse 78, temperature 98.6 F (37 C), temperature source Oral, resp. rate 20., There is no weight on file to calculate BMI. General:  WNWD WF. Appears in no acute distress. HEENT: Right Eye is "watering" and with minimal erythema injection. Neck: Supple. No thyromegaly. No lymphadenopathy. Lungs: Clear bilaterally to auscultation without wheezes, rales, or rhonchi. Breathing is unlabored. Heart: Regular rhythm. No murmurs, rubs, or gallops. Msk:  Strength and tone normal for age. Extremities/Skin: Warm and dry.  No rashes. Neuro: Alert and  oriented X 3. Moves all extremities spontaneously. Gait is normal. 5/5 strength in all 4 extremities. 5/5 bilateral grip strength.  When asked to raise her eyebrows--- her left eyebrow raises significantly but the right does not move. When asked to frown,  the left side of her face goes downward but the right side does not move. When asked to close both eyes tightly-- she can close the left eye tightly but cannot completely close the right eye. When asked to show both upper and lower teeth--the left side of her mouth opens to show these teeth but the right side does not When asked to smile-- the left side of her mouth goes upward into a smile but the right side does not Has flattening of the nasolabial fold on the right. She is unable to puff out both cheeks Psych:  Responds to questions appropriately with a normal affect.     ASSESSMENT AND PLAN:  74 y.o. year old female with  1. Bell's palsy - valACYclovir (VALTREX) 500 MG tablet; Take 1 tablet (500 mg total) by mouth 3 (three) times daily.  Dispense: 21 tablet; Refill: 0 - predniSONE (DELTASONE) 20 MG tablet; Take 3 daily for 2 days, then 2 daily for 2 days, then 1 daily for 2 days.  Dispense: 12 tablet; Refill: 0 - Ambulatory referral to Ophthalmology  She is to start the Valtrex and prednisone immediately and take as directed. Told her to use saline eyedrops frequently to keep the right eye moist. She states that she is having a little bit of blurred vision in the right eye--- therefore will go ahead and schedule referral to ophthalmology--ordered as urgent--- she states that she already has an established ophthalmologist and sees Dr. Herbert Deaner --comment added to schedule appointment at that [facility].  Signed, [address] [place], [place], [facility] 05/26/2015 1:00 PM

## 2015-05-30 ENCOUNTER — Other Ambulatory Visit: Payer: Self-pay | Admitting: Family Medicine

## 2015-05-31 ENCOUNTER — Other Ambulatory Visit: Payer: Self-pay | Admitting: Family Medicine

## 2015-05-31 DIAGNOSIS — H16149 Punctate keratitis, unspecified eye: Secondary | ICD-10-CM | POA: Diagnosis not present

## 2015-05-31 NOTE — Telephone Encounter (Signed)
Ok to refill??  Last office visit 05/26/2015.  Last refill 10/01/2014.

## 2015-05-31 NOTE — Telephone Encounter (Signed)
Medication called to pharmacy. 

## 2015-05-31 NOTE — Telephone Encounter (Signed)
ok 

## 2015-06-02 DIAGNOSIS — H02103 Unspecified ectropion of right eye, unspecified eyelid: Secondary | ICD-10-CM | POA: Diagnosis not present

## 2015-06-02 DIAGNOSIS — H04123 Dry eye syndrome of bilateral lacrimal glands: Secondary | ICD-10-CM | POA: Diagnosis not present

## 2015-06-02 DIAGNOSIS — H16149 Punctate keratitis, unspecified eye: Secondary | ICD-10-CM | POA: Diagnosis not present

## 2015-06-02 DIAGNOSIS — G510 Bell's palsy: Secondary | ICD-10-CM | POA: Diagnosis not present

## 2015-06-04 ENCOUNTER — Telehealth: Payer: Self-pay | Admitting: Family Medicine

## 2015-06-04 NOTE — Telephone Encounter (Signed)
Not on valtrex (?).  OK to stop erythromycin if eye is better.

## 2015-06-04 NOTE — Telephone Encounter (Signed)
Patient aware via vm

## 2015-06-04 NOTE — Telephone Encounter (Signed)
Pt is calling to see if Dr. Dennard Schaumann wants her to continue to take the valtrex. She will be out of it tomorrow. [phone]

## 2015-06-07 DIAGNOSIS — G510 Bell's palsy: Secondary | ICD-10-CM | POA: Diagnosis not present

## 2015-06-07 DIAGNOSIS — H16149 Punctate keratitis, unspecified eye: Secondary | ICD-10-CM | POA: Diagnosis not present

## 2015-06-07 DIAGNOSIS — H02103 Unspecified ectropion of right eye, unspecified eyelid: Secondary | ICD-10-CM | POA: Diagnosis not present

## 2015-06-08 ENCOUNTER — Ambulatory Visit (INDEPENDENT_AMBULATORY_CARE_PROVIDER_SITE_OTHER): Payer: Medicare Other | Admitting: Family Medicine

## 2015-06-08 ENCOUNTER — Encounter: Payer: Self-pay | Admitting: Family Medicine

## 2015-06-08 ENCOUNTER — Ambulatory Visit
Admission: RE | Admit: 2015-06-08 | Discharge: 2015-06-08 | Disposition: A | Discharge status: Home / Self Care | Payer: Medicare Other | Source: Ambulatory Visit | Attending: Family Medicine | Admitting: Family Medicine

## 2015-06-08 VITALS — BP 130/80 | HR 76 | Temp 98.6°F | Resp 22 | Ht 67.5 in | Wt 154.0 lb

## 2015-06-08 DIAGNOSIS — J45901 Unspecified asthma with (acute) exacerbation: Secondary | ICD-10-CM | POA: Diagnosis not present

## 2015-06-08 DIAGNOSIS — R05 Cough: Secondary | ICD-10-CM | POA: Diagnosis not present

## 2015-06-08 DIAGNOSIS — R0602 Shortness of breath: Secondary | ICD-10-CM | POA: Diagnosis not present

## 2015-06-08 MED ORDER — AZITHROMYCIN 250 MG PO TABS: ORAL_TABLET | ORAL | Status: DC

## 2015-06-08 MED ORDER — PREDNISONE 20 MG PO TABS: ORAL_TABLET | ORAL | Status: DC

## 2015-06-08 NOTE — Progress Notes (Signed)
Subjective:    Patient ID: [name], female    DOB: 20-Jun-1941, 74 y.o.   MRN: [id number]  HPI Patient recently finished prednisone and valtrex fro Bell's palsy.  Shortly thereafter she developed worsening cough, sob, wheezing, and productive cough of brown sputum.  She denies fever, chills.  She denies rhinorrhea, sore throat, sinus pain, otalgia.  Last night she used albuterol due to wheezing and shortness of breath. Past Medical History  Diagnosis Date  . Allergic rhinitis   . Anemia   . Diverticula of colon   . Gastric ulcer with hemorrhage   . Esophagitis   . Arrhythmia   . Aseptic necrosis   . Basal cell cancer   . Osteoporosis   . Hyperlipidemia   . Anxiety   . Depression   . Lung cancer   . COPD (chronic obstructive pulmonary disease)   . GERD (gastroesophageal reflux disease)   . Hypertension   . Kidney stones   . Pneumonia    Past Surgical History  Procedure Laterality Date  . Laparoscopic salpingoopherectomy    . Thoracotomy    . Mitral valve replacement     Current Outpatient Prescriptions on File Prior to Visit  Medication Sig Dispense Refill  . ALPRAZolam (XANAX) 0.5 MG tablet TAKE 1 TABLET BY MOUTH AT BEDTIME AS NEEDED 30 tablet 0  . aspirin 81 MG tablet Take 81 mg by mouth every other day.     . b complex-C-folic acid 1 MG capsule Take 1 capsule by mouth daily.      Marland Kitchen buPROPion (WELLBUTRIN XL) 150 MG 24 hr tablet TAKE 1 TABLET DAILY 90 tablet 4  . celecoxib (CELEBREX) 200 MG capsule Take 1 capsule (200 mg total) by mouth daily. 90 capsule 3  . cyclobenzaprine (FLEXERIL) 10 MG tablet TAKE 1 TABLET BY MOUTH AT BEDTIME 90 tablet 0  . DULoxetine (CYMBALTA) 60 MG capsule TAKE 1 CAPSULE DAILY 90 capsule 1  . erythromycin ophthalmic ointment Place 1 application into the right eye 2 (two) times daily. 3.5 g 0  . fluticasone (FLONASE) 50 MCG/ACT nasal spray Place 2 sprays into both nostrils daily. 48 g 3  . HYDROcodone-acetaminophen (NORCO) 5-325 MG per  tablet Take 1 tablet by mouth every 6 (six) hours as needed for moderate pain. 30 tablet 0  . levothyroxine (SYNTHROID, LEVOTHROID) 100 MCG tablet TAKE 1 TABLET EVERY MORNING 90 tablet 3  . lisinopril-hydrochlorothiazide (PRINZIDE,ZESTORETIC) 10-12.5 MG per tablet TAKE 1 TABLET DAILY 90 tablet 3  . loratadine (CLARITIN) 10 MG tablet TAKE 1 TABLET DAILY 90 tablet 3  . mometasone (ELOCON) 0.1 % ointment Apply topically daily. 45 g 0  . oxyCODONE-acetaminophen (PERCOCET/ROXICET) 5-325 MG per tablet Take 1-2 tablets by mouth every 6 (six) hours as needed for severe pain. 20 tablet 0  . predniSONE (DELTASONE) 20 MG tablet Take 3 daily for 2 days, then 2 daily for 2 days, then 1 daily for 2 days. 12 tablet 0  . simvastatin (ZOCOR) 40 MG tablet Take 1 tablet (40 mg total) by mouth at bedtime. 90 tablet 3  . tiotropium (SPIRIVA HANDIHALER) 18 MCG inhalation capsule Place 1 capsule (18 mcg total) into inhaler and inhale daily. 90 capsule 3  . traZODone (DESYREL) 50 MG tablet Take 50 mg by mouth at bedtime as needed for sleep.     . valACYclovir (VALTREX) 1000 MG tablet TAKE 2 TABLETS BY MOUTH 2 TIMES A DAY 20 tablet 3  . valACYclovir (VALTREX) 500 MG tablet Take 1  tablet (500 mg total) by mouth 3 (three) times daily. 21 tablet 0  . VITAMIN D, CHOLECALCIFEROL, PO Take 1 tablet by mouth daily.     No current facility-administered medications on file prior to visit.   Allergies  Allergen Reactions  . Doxycycline Nausea Only  . Erythromycin Nausea Only  . Sulfonamide Derivatives Hives   Social History   Social History  . Marital Status: Married    Spouse Name: N/A  . Number of Children: 3  . Years of Education: N/A   Occupational History  . retired Marine scientist    Social History Main Topics  . Smoking status: Former Smoker -- 0.50 packs/day for 25 years    Types: Cigarettes    Quit date: 09/11/2004  . Smokeless tobacco: Never Used  . Alcohol Use: Yes     Comment: social  . Drug Use: No  . Sexual  Activity: Not on file   Other Topics Concern  . Not on file   Social History Narrative      Review of Systems  All other systems reviewed and are negative.      Objective:   Physical Exam  Constitutional: She appears well-developed and well-nourished.  HENT:  Right Ear: External ear normal.  Left Ear: External ear normal.  Nose: Nose normal.  Mouth/Throat: Oropharynx is clear and moist.  Neck: Neck supple.  Cardiovascular: Normal rate, regular rhythm and normal heart sounds.   Pulmonary/Chest: She has decreased breath sounds. She has wheezes.  Abdominal: Soft. Bowel sounds are normal.  Musculoskeletal: She exhibits no edema.  Lymphadenopathy:    She has no cervical adenopathy.  Vitals reviewed.  Patient has the physical stigmata of Bell's palsy on the right side. She has markedly diminished breath sounds with expiratory wheezing.  There may be some faint rhonchi in the right lower lobe posteriorly       Assessment & Plan:  Asthmatic bronchitis with acute exacerbation - Plan: predniSONE (DELTASONE) 20 MG tablet, azithromycin (ZITHROMAX) 250 MG tablet, DG Chest 2 View  Begin prednisone taper pack for asthmatic bronchitis. Use albuterol 2 puffs inhaled every 6 hours for wheezing. Cover atypical bacterial infections with a Z-Pak. I asked the patient to go for a chest x-ray to rule out subtle pneumonia in the right lower lobe.

## 2015-06-09 ENCOUNTER — Telehealth: Payer: Self-pay | Admitting: Family Medicine

## 2015-06-09 DIAGNOSIS — R0602 Shortness of breath: Secondary | ICD-10-CM | POA: Diagnosis not present

## 2015-06-09 DIAGNOSIS — G4734 Idiopathic sleep related nonobstructive alveolar hypoventilation: Secondary | ICD-10-CM

## 2015-06-09 DIAGNOSIS — R0902 Hypoxemia: Secondary | ICD-10-CM | POA: Diagnosis not present

## 2015-06-09 NOTE — Telephone Encounter (Signed)
Pt went to [facility] today to see her doctor there and they told her that her o2 at night was dropping to 82% and he thinks she needs a sleep study and was wanting [place] to do it here in [place] if possible. OK to order??

## 2015-06-10 NOTE — Telephone Encounter (Signed)
Sleep study ordered

## 2015-06-10 NOTE — Telephone Encounter (Signed)
Absolutely.

## 2015-06-11 ENCOUNTER — Encounter: Payer: Self-pay | Admitting: Family Medicine

## 2015-06-11 ENCOUNTER — Ambulatory Visit (INDEPENDENT_AMBULATORY_CARE_PROVIDER_SITE_OTHER): Payer: Medicare Other | Admitting: Family Medicine

## 2015-06-11 VITALS — BP 160/86 | HR 108 | Temp 98.5°F | Resp 26 | Ht 67.5 in | Wt 154.0 lb

## 2015-06-11 DIAGNOSIS — J45901 Unspecified asthma with (acute) exacerbation: Secondary | ICD-10-CM

## 2015-06-11 DIAGNOSIS — J441 Chronic obstructive pulmonary disease with (acute) exacerbation: Secondary | ICD-10-CM

## 2015-06-11 DIAGNOSIS — J449 Chronic obstructive pulmonary disease, unspecified: Secondary | ICD-10-CM | POA: Diagnosis not present

## 2015-06-11 MED ORDER — PREDNISONE 20 MG PO TABS: 60.0000 mg | ORAL_TABLET | Freq: Every day | ORAL | Status: DC

## 2015-06-11 MED ORDER — IPRATROPIUM-ALBUTEROL 0.5-2.5 (3) MG/3ML IN SOLN: 3.0000 mL | Freq: Four times a day (QID) | RESPIRATORY_TRACT | Status: DC

## 2015-06-11 MED ORDER — METHYLPREDNISOLONE ACETATE 80 MG/ML IJ SUSP
80.0000 mg | Freq: Once | INTRAMUSCULAR | Status: AC
  Administered 2015-06-11: 80 mg via INTRAMUSCULAR

## 2015-06-11 NOTE — Progress Notes (Signed)
Subjective:    Patient ID: [name], female    DOB: [birth year], 74 y.o.   MRN: [id number]  HPI 06/08/15 Patient recently finished prednisone and valtrex fro Bell's palsy.  Shortly thereafter she developed worsening cough, sob, wheezing, and productive cough of brown sputum.  She denies fever, chills.  She denies rhinorrhea, sore throat, sinus pain, otalgia.  Last night she used albuterol due to wheezing and shortness of breath.  At that time, my plan was:  Begin prednisone taper pack for asthmatic bronchitis. Use albuterol 2 puffs inhaled every 6 hours for wheezing. Cover atypical bacterial infections with a Z-Pak. I asked the patient to go for a chest x-ray to rule out subtle pneumonia in the right lower lobe.  06/11/15 Patient's breathing is much worse. She reports significant dyspnea on exertion. She reports dyspnea at rest. She is 92% on room air. She has diffuse expiratory wheezes, with diffuse rhonchorous breath sounds. She is using albuterol metered-dose inhalers at home every 4-6 hours with minimal relief. She has completed a prednisone taper pack along with the antibiotic. Chest x-ray was negative for pneumonia. Past Medical History  Diagnosis Date  . Allergic rhinitis   . Anemia   . Diverticula of colon   . Gastric ulcer with hemorrhage   . Esophagitis   . Arrhythmia   . Aseptic necrosis   . Basal cell cancer   . Osteoporosis   . Hyperlipidemia   . Anxiety   . Depression   . Lung cancer   . COPD (chronic obstructive pulmonary disease)   . GERD (gastroesophageal reflux disease)   . Hypertension   . Kidney stones   . Pneumonia    Past Surgical History  Procedure Laterality Date  . Laparoscopic salpingoopherectomy    . Thoracotomy    . Mitral valve replacement     Current Outpatient Prescriptions on File Prior to Visit  Medication Sig Dispense Refill  . ALPRAZolam (XANAX) 0.5 MG tablet TAKE 1 TABLET BY MOUTH AT BEDTIME AS NEEDED 30 tablet 0  . aspirin 81 MG  tablet Take 81 mg by mouth every other day.     . b complex-C-folic acid 1 MG capsule Take 1 capsule by mouth daily.      Marland Kitchen buPROPion (WELLBUTRIN XL) 150 MG 24 hr tablet TAKE 1 TABLET DAILY 90 tablet 4  . celecoxib (CELEBREX) 200 MG capsule Take 1 capsule (200 mg total) by mouth daily. 90 capsule 3  . cyclobenzaprine (FLEXERIL) 10 MG tablet TAKE 1 TABLET BY MOUTH AT BEDTIME 90 tablet 0  . DULoxetine (CYMBALTA) 60 MG capsule TAKE 1 CAPSULE DAILY 90 capsule 1  . erythromycin ophthalmic ointment Place 1 application into the right eye 2 (two) times daily. 3.5 g 0  . fluticasone (FLONASE) 50 MCG/ACT nasal spray Place 2 sprays into both nostrils daily. 48 g 3  . HYDROcodone-acetaminophen (NORCO) 5-325 MG per tablet Take 1 tablet by mouth every 6 (six) hours as needed for moderate pain. 30 tablet 0  . levothyroxine (SYNTHROID, LEVOTHROID) 100 MCG tablet TAKE 1 TABLET EVERY MORNING 90 tablet 3  . lisinopril-hydrochlorothiazide (PRINZIDE,ZESTORETIC) 10-12.5 MG per tablet TAKE 1 TABLET DAILY 90 tablet 3  . loratadine (CLARITIN) 10 MG tablet TAKE 1 TABLET DAILY 90 tablet 3  . mometasone (ELOCON) 0.1 % ointment Apply topically daily. 45 g 0  . oxyCODONE-acetaminophen (PERCOCET/ROXICET) 5-325 MG per tablet Take 1-2 tablets by mouth every 6 (six) hours as needed for severe pain. 20 tablet 0  .  predniSONE (DELTASONE) 20 MG tablet Take 3 daily for 2 days, then 2 daily for 2 days, then 1 daily for 2 days. 12 tablet 0  . simvastatin (ZOCOR) 40 MG tablet Take 1 tablet (40 mg total) by mouth at bedtime. 90 tablet 3  . tiotropium (SPIRIVA HANDIHALER) 18 MCG inhalation capsule Place 1 capsule (18 mcg total) into inhaler and inhale daily. 90 capsule 3  . traZODone (DESYREL) 50 MG tablet Take 50 mg by mouth at bedtime as needed for sleep.     . valACYclovir (VALTREX) 1000 MG tablet TAKE 2 TABLETS BY MOUTH 2 TIMES A DAY 20 tablet 3  . valACYclovir (VALTREX) 500 MG tablet Take 1 tablet (500 mg total) by mouth 3 (three)  times daily. 21 tablet 0  . VITAMIN D, CHOLECALCIFEROL, PO Take 1 tablet by mouth daily.     No current facility-administered medications on file prior to visit.   Allergies  Allergen Reactions  . Doxycycline Nausea Only  . Erythromycin Nausea Only  . Sulfonamide Derivatives Hives   Social History   Social History  . Marital Status: Married    Spouse Name: N/A  . Number of Children: 3  . Years of Education: N/A   Occupational History  . retired Marine scientist    Social History Main Topics  . Smoking status: Former Smoker -- 0.50 packs/day for 25 years    Types: Cigarettes    Quit date: 09/11/2004  . Smokeless tobacco: Never Used  . Alcohol Use: Yes     Comment: social  . Drug Use: No  . Sexual Activity: Not on file   Other Topics Concern  . Not on file   Social History Narrative      Review of Systems  All other systems reviewed and are negative.      Objective:   Physical Exam  Constitutional: She appears well-developed and well-nourished.  HENT:  Right Ear: External ear normal.  Left Ear: External ear normal.  Nose: Nose normal.  Mouth/Throat: Oropharynx is clear and moist.  Neck: Neck supple.  Cardiovascular: Normal rate, regular rhythm and normal heart sounds.   Pulmonary/Chest: Accessory muscle usage present. Tachypnea noted. She has decreased breath sounds. She has wheezes. She has rhonchi.  Abdominal: Soft. Bowel sounds are normal.  Musculoskeletal: She exhibits no edema.  Lymphadenopathy:    She has no cervical adenopathy.  Vitals reviewed.  Patient has the physical stigmata of Bell's palsy on the right side. She has markedly diminished breath sounds with expiratory wheezing.         Assessment & Plan:  Asthma, chronic obstructive, with acute exacerbation - Plan: predniSONE (DELTASONE) 20 MG tablet patient's respiratory status is worsening. She appears to have an asthma exacerbation. I will give the patient 80 mg of Depo-Medrol 1 right now. It is  11:38.  I will give the patient a DuoNeb with 2.5 mg of albuterol and 0.5 mg of Atrovent. I'll recheck the patient in 15-20 minutes. She may require ER evaluation if I cannot break the attack.  I rechecked the patient at approximately 12:00. Her lungs sound much clearer. She still has diffuse expiratory wheezes although rhonchorous breath sounds have improved. Furthermore her air exchange has improved and she has improved breath sounds. I am now appreciating again right basilar crackles even know the previous chest x-ray was clear. I would like to have the patient stay here for the next 30-60 minutes to monitor her. If I can maintain this improvement for over an hour,  I would send her home with a nebulizer machine and  Albuterol 2.5 mg nebs to perform every 4-6 hours as needed. However if she quickly decompensates again, I would recommend  Going to the emergency room. I will recheck the patient in 20-30 minutes.  Seems to be doing much better.  Will discharge home on nebulizers evry 4 hours if needed and prednisone 60 mg poqday for 6 days.  Recheck next week or sooner if worse.

## 2015-06-14 ENCOUNTER — Ambulatory Visit (INDEPENDENT_AMBULATORY_CARE_PROVIDER_SITE_OTHER): Payer: Medicare Other | Admitting: Family Medicine

## 2015-06-14 ENCOUNTER — Encounter: Payer: Self-pay | Admitting: Family Medicine

## 2015-06-14 VITALS — BP 146/80 | HR 84 | Temp 98.3°F | Resp 24 | Ht 67.5 in | Wt 154.0 lb

## 2015-06-14 DIAGNOSIS — J45901 Unspecified asthma with (acute) exacerbation: Secondary | ICD-10-CM | POA: Diagnosis not present

## 2015-06-14 DIAGNOSIS — J441 Chronic obstructive pulmonary disease with (acute) exacerbation: Secondary | ICD-10-CM | POA: Diagnosis not present

## 2015-06-14 NOTE — Progress Notes (Signed)
Subjective:    Patient ID: [name], female    DOB: 08-09-1941, 74 y.o.   MRN: [id number]  HPI 06/08/15 Patient recently finished prednisone and valtrex fro Bell's palsy.  Shortly thereafter she developed worsening cough, sob, wheezing, and productive cough of brown sputum.  She denies fever, chills.  She denies rhinorrhea, sore throat, sinus pain, otalgia.  Last night she used albuterol due to wheezing and shortness of breath.  At that time, my plan was:  Begin prednisone taper pack for asthmatic bronchitis. Use albuterol 2 puffs inhaled every 6 hours for wheezing. Cover atypical bacterial infections with a Z-Pak. I asked the patient to go for a chest x-ray to rule out subtle pneumonia in the right lower lobe.  06/11/15 Patient's breathing is much worse. She reports significant dyspnea on exertion. She reports dyspnea at rest. She is 92% on room air. She has diffuse expiratory wheezes, with diffuse rhonchorous breath sounds. She is using albuterol metered-dose inhalers at home every 4-6 hours with minimal relief. She has completed a prednisone taper pack along with the antibiotic. Chest x-ray was negative for pneumonia.  At that time, my plan was: patient's respiratory status is worsening. She appears to have an asthma exacerbation. I will give the patient 80 mg of Depo-Medrol 1 right now. It is 11:38.  I will give the patient a DuoNeb with 2.5 mg of albuterol and 0.5 mg of Atrovent. I'll recheck the patient in 15-20 minutes. She may require ER evaluation if I cannot break the attack.  I rechecked the patient at approximately 12:00. Her lungs sound much clearer. She still has diffuse expiratory wheezes although rhonchorous breath sounds have improved. Furthermore her air exchange has improved and she has improved breath sounds. I am now appreciating again right basilar crackles even know the previous chest x-ray was clear. I would like to have the patient stay here for the next 30-60  minutes to monitor her. If I can maintain this improvement for over an hour, I would send her home with a nebulizer machine and  Albuterol 2.5 mg nebs to perform every 4-6 hours as needed. However if she quickly decompensates again, I would recommend  Going to the emergency room. I will recheck the patient in 20-30 minutes.  Seems to be doing much better.  Will discharge home on nebulizers evry 4 hours if needed and prednisone 60 mg poqday for 6 days.  Recheck next week or sooner if worse.    06/14/15 Patient is doing much better today. She is still requiring albuterol every 4 hours. Her wheezing has improved. Her shortness of breath has improved. Her hypoxia has improved. However she is still wheezing on exam and she does report rhinorrhea and congestion. Past Medical History  Diagnosis Date  . Allergic rhinitis   . Anemia   . Diverticula of colon   . Gastric ulcer with hemorrhage   . Esophagitis   . Arrhythmia   . Aseptic necrosis ([place])   . Basal cell cancer   . Osteoporosis   . Hyperlipidemia   . Anxiety   . Depression   . Lung cancer ([place])   . COPD (chronic obstructive pulmonary disease) ([place])   . GERD (gastroesophageal reflux disease)   . Hypertension   . Kidney stones   . Pneumonia    Past Surgical History  Procedure Laterality Date  . Laparoscopic salpingoopherectomy    . Thoracotomy    . Mitral valve replacement     Current Outpatient Prescriptions  on File Prior to Visit  Medication Sig Dispense Refill  . ALPRAZolam (XANAX) 0.5 MG tablet TAKE 1 TABLET BY MOUTH AT BEDTIME AS NEEDED 30 tablet 0  . aspirin 81 MG tablet Take 81 mg by mouth every other day.     . b complex-C-folic acid 1 MG capsule Take 1 capsule by mouth daily.      Marland Kitchen buPROPion (WELLBUTRIN XL) 150 MG 24 hr tablet TAKE 1 TABLET DAILY 90 tablet 4  . celecoxib (CELEBREX) 200 MG capsule Take 1 capsule (200 mg total) by mouth daily. 90 capsule 3  . cyclobenzaprine (FLEXERIL) 10 MG tablet TAKE 1 TABLET BY MOUTH  AT BEDTIME 90 tablet 0  . DULoxetine (CYMBALTA) 60 MG capsule TAKE 1 CAPSULE DAILY 90 capsule 1  . erythromycin ophthalmic ointment Place 1 application into the right eye 2 (two) times daily. 3.5 g 0  . fluticasone (FLONASE) 50 MCG/ACT nasal spray Place 2 sprays into both nostrils daily. 48 g 3  . HYDROcodone-acetaminophen (NORCO) 5-325 MG per tablet Take 1 tablet by mouth every 6 (six) hours as needed for moderate pain. 30 tablet 0  . levothyroxine (SYNTHROID, LEVOTHROID) 100 MCG tablet TAKE 1 TABLET EVERY MORNING 90 tablet 3  . lisinopril-hydrochlorothiazide (PRINZIDE,ZESTORETIC) 10-12.5 MG per tablet TAKE 1 TABLET DAILY 90 tablet 3  . loratadine (CLARITIN) 10 MG tablet TAKE 1 TABLET DAILY 90 tablet 3  . mometasone (ELOCON) 0.1 % ointment Apply topically daily. 45 g 0  . oxyCODONE-acetaminophen (PERCOCET/ROXICET) 5-325 MG per tablet Take 1-2 tablets by mouth every 6 (six) hours as needed for severe pain. 20 tablet 0  . predniSONE (DELTASONE) 20 MG tablet Take 3 daily for 2 days, then 2 daily for 2 days, then 1 daily for 2 days. 12 tablet 0  . predniSONE (DELTASONE) 20 MG tablet Take 3 tablets (60 mg total) by mouth daily with breakfast. 18 tablet 0  . simvastatin (ZOCOR) 40 MG tablet Take 1 tablet (40 mg total) by mouth at bedtime. 90 tablet 3  . tiotropium (SPIRIVA HANDIHALER) 18 MCG inhalation capsule Place 1 capsule (18 mcg total) into inhaler and inhale daily. 90 capsule 3  . traZODone (DESYREL) 50 MG tablet Take 50 mg by mouth at bedtime as needed for sleep.     . valACYclovir (VALTREX) 1000 MG tablet TAKE 2 TABLETS BY MOUTH 2 TIMES A DAY 20 tablet 3  . valACYclovir (VALTREX) 500 MG tablet Take 1 tablet (500 mg total) by mouth 3 (three) times daily. 21 tablet 0  . VITAMIN D, CHOLECALCIFEROL, PO Take 1 tablet by mouth daily.     Current Facility-Administered Medications on File Prior to Visit  Medication Dose Route Frequency Provider Last Rate Last Dose  . ipratropium-albuterol (DUONEB)  0.5-2.5 (3) MG/3ML nebulizer solution 3 mL  3 mL Nebulization Q6H Susy Frizzle, MD       Allergies  Allergen Reactions  . Doxycycline Nausea Only  . Erythromycin Nausea Only  . Sulfonamide Derivatives Hives   Social History   Social History  . Marital Status: Married    Spouse Name: N/A  . Number of Children: 3  . Years of Education: N/A   Occupational History  . retired Marine scientist    Social History Main Topics  . Smoking status: Former Smoker -- 0.50 packs/day for 25 years    Types: Cigarettes    Quit date: 09/11/2004  . Smokeless tobacco: Never Used  . Alcohol Use: Yes     Comment: social  . Drug  Use: No  . Sexual Activity: Not on file   Other Topics Concern  . Not on file   Social History Narrative      Review of Systems  All other systems reviewed and are negative.      Objective:   Physical Exam  Constitutional: She appears well-developed and well-nourished.  HENT:  Right Ear: External ear normal.  Left Ear: External ear normal.  Nose: Nose normal.  Mouth/Throat: Oropharynx is clear and moist.  Neck: Neck supple.  Cardiovascular: Normal rate, regular rhythm and normal heart sounds.   Pulmonary/Chest: No accessory muscle usage. No tachypnea. She has no decreased breath sounds. She has wheezes. She has no rhonchi.  Abdominal: Soft. Bowel sounds are normal.  Musculoskeletal: She exhibits no edema.  Lymphadenopathy:    She has no cervical adenopathy.  Vitals reviewed.  Patient has the physical stigmata of Bell's palsy on the right side.        Assessment & Plan:  Asthma, chronic obstructive, with acute exacerbation (HCC) decrease albuterol to every 6 hours as needed. Continue Spiriva. Complete prednisone. Add symbicort 160/4.5 2 puff inh bid.  Recheck on Friday or sooner if worse

## 2015-06-16 ENCOUNTER — Encounter (HOSPITAL_BASED_OUTPATIENT_CLINIC_OR_DEPARTMENT_OTHER): Payer: Medicare Other

## 2015-06-18 ENCOUNTER — Other Ambulatory Visit: Payer: Self-pay | Admitting: Family Medicine

## 2015-06-18 ENCOUNTER — Ambulatory Visit
Admission: RE | Admit: 2015-06-18 | Discharge: 2015-06-18 | Disposition: A | Discharge status: Home / Self Care | Payer: Medicare Other | Source: Ambulatory Visit | Attending: Family Medicine | Admitting: Family Medicine

## 2015-06-18 ENCOUNTER — Encounter: Payer: Self-pay | Admitting: Family Medicine

## 2015-06-18 ENCOUNTER — Ambulatory Visit (INDEPENDENT_AMBULATORY_CARE_PROVIDER_SITE_OTHER): Payer: Medicare Other | Admitting: Family Medicine

## 2015-06-18 VITALS — BP 140/78 | HR 82 | Temp 98.9°F | Resp 18 | Ht 67.5 in | Wt 156.0 lb

## 2015-06-18 DIAGNOSIS — J45901 Unspecified asthma with (acute) exacerbation: Secondary | ICD-10-CM | POA: Diagnosis not present

## 2015-06-18 DIAGNOSIS — J45909 Unspecified asthma, uncomplicated: Secondary | ICD-10-CM

## 2015-06-18 DIAGNOSIS — J449 Chronic obstructive pulmonary disease, unspecified: Secondary | ICD-10-CM

## 2015-06-18 MED ORDER — FLUTICASONE-SALMETEROL 250-50 MCG/DOSE IN AEPB: 1.0000 | INHALATION_SPRAY | Freq: Two times a day (BID) | RESPIRATORY_TRACT | Status: DC

## 2015-06-18 NOTE — Telephone Encounter (Signed)
90 day refill to mail order

## 2015-06-18 NOTE — Progress Notes (Signed)
Subjective:    Patient ID: [name], female    DOB: 1940/12/14, 74 y.o.   MRN: [id number]  HPI 06/08/15 Patient recently finished prednisone and valtrex fro Bell's palsy.  Shortly thereafter she developed worsening cough, sob, wheezing, and productive cough of brown sputum.  She denies fever, chills.  She denies rhinorrhea, sore throat, sinus pain, otalgia.  Last night she used albuterol due to wheezing and shortness of breath.  At that time, my plan was:  Begin prednisone taper pack for asthmatic bronchitis. Use albuterol 2 puffs inhaled every 6 hours for wheezing. Cover atypical bacterial infections with a Z-Pak. I asked the patient to go for a chest x-ray to rule out subtle pneumonia in the right lower lobe.  06/11/15 Patient's breathing is much worse. She reports significant dyspnea on exertion. She reports dyspnea at rest. She is 92% on room air. She has diffuse expiratory wheezes, with diffuse rhonchorous breath sounds. She is using albuterol metered-dose inhalers at home every 4-6 hours with minimal relief. She has completed a prednisone taper pack along with the antibiotic. Chest x-ray was negative for pneumonia.  At that time, my plan was: patient's respiratory status is worsening. She appears to have an asthma exacerbation. I will give the patient 80 mg of Depo-Medrol 1 right now. It is 11:38.  I will give the patient a DuoNeb with 2.5 mg of albuterol and 0.5 mg of Atrovent. I'll recheck the patient in 15-20 minutes. She may require ER evaluation if I cannot break the attack.  I rechecked the patient at approximately 12:00. Her lungs sound much clearer. She still has diffuse expiratory wheezes although rhonchorous breath sounds have improved. Furthermore her air exchange has improved and she has improved breath sounds. I am now appreciating again right basilar crackles even know the previous chest x-ray was clear. I would like to have the patient stay here for the next 30-60  minutes to monitor her. If I can maintain this improvement for over an hour, I would send her home with a nebulizer machine and  Albuterol 2.5 mg nebs to perform every 4-6 hours as needed. However if she quickly decompensates again, I would recommend  Going to the emergency room. I will recheck the patient in 20-30 minutes.  Seems to be doing much better.  Will discharge home on nebulizers evry 4 hours if needed and prednisone 60 mg poqday for 6 days.  Recheck next week or sooner if worse.    06/14/15 Patient is doing much better today. She is still requiring albuterol every 4 hours. Her wheezing has improved. Her shortness of breath has improved. Her hypoxia has improved. However she is still wheezing on exam and she does report rhinorrhea and congestion.  At that time, my plan was: decrease albuterol to every 6 hours as needed. Continue Spiriva. Complete prednisone. Add symbicort 160/4.5 2 puff inh bid.  Recheck on Friday or sooner if worse  06/18/15 Here today for follow up.  Her breathing is much better.  Her wheezing has almost resolved.  Has not used albuterol in 1 week.  Using symbicort and spiriva. Past Medical History  Diagnosis Date  . Allergic rhinitis   . Anemia   . Diverticula of colon   . Gastric ulcer with hemorrhage   . Esophagitis   . Arrhythmia   . Aseptic necrosis ([place])   . Basal cell cancer   . Osteoporosis   . Hyperlipidemia   . Anxiety   . Depression   .  Lung cancer ([place])   . COPD (chronic obstructive pulmonary disease) ([place])   . GERD (gastroesophageal reflux disease)   . Hypertension   . Kidney stones   . Pneumonia    Past Surgical History  Procedure Laterality Date  . Laparoscopic salpingoopherectomy    . Thoracotomy    . Mitral valve replacement     Current Outpatient Prescriptions on File Prior to Visit  Medication Sig Dispense Refill  . ALPRAZolam (XANAX) 0.5 MG tablet TAKE 1 TABLET BY MOUTH AT BEDTIME AS NEEDED 30 tablet 0  . aspirin 81 MG tablet Take  81 mg by mouth every other day.     . b complex-C-folic acid 1 MG capsule Take 1 capsule by mouth daily.      Marland Kitchen buPROPion (WELLBUTRIN XL) 150 MG 24 hr tablet TAKE 1 TABLET DAILY 90 tablet 4  . celecoxib (CELEBREX) 200 MG capsule Take 1 capsule (200 mg total) by mouth daily. 90 capsule 3  . cyclobenzaprine (FLEXERIL) 10 MG tablet TAKE 1 TABLET BY MOUTH AT BEDTIME 90 tablet 0  . DULoxetine (CYMBALTA) 60 MG capsule TAKE 1 CAPSULE DAILY 90 capsule 1  . erythromycin ophthalmic ointment Place 1 application into the right eye 2 (two) times daily. 3.5 g 0  . fluticasone (FLONASE) 50 MCG/ACT nasal spray Place 2 sprays into both nostrils daily. 48 g 3  . HYDROcodone-acetaminophen (NORCO) 5-325 MG per tablet Take 1 tablet by mouth every 6 (six) hours as needed for moderate pain. 30 tablet 0  . levothyroxine (SYNTHROID, LEVOTHROID) 100 MCG tablet TAKE 1 TABLET EVERY MORNING 90 tablet 3  . lisinopril-hydrochlorothiazide (PRINZIDE,ZESTORETIC) 10-12.5 MG per tablet TAKE 1 TABLET DAILY 90 tablet 3  . loratadine (CLARITIN) 10 MG tablet TAKE 1 TABLET DAILY 90 tablet 3  . mometasone (ELOCON) 0.1 % ointment Apply topically daily. 45 g 0  . oxyCODONE-acetaminophen (PERCOCET/ROXICET) 5-325 MG per tablet Take 1-2 tablets by mouth every 6 (six) hours as needed for severe pain. 20 tablet 0  . predniSONE (DELTASONE) 20 MG tablet Take 3 daily for 2 days, then 2 daily for 2 days, then 1 daily for 2 days. 12 tablet 0  . predniSONE (DELTASONE) 20 MG tablet Take 3 tablets (60 mg total) by mouth daily with breakfast. 18 tablet 0  . simvastatin (ZOCOR) 40 MG tablet Take 1 tablet (40 mg total) by mouth at bedtime. 90 tablet 3  . tiotropium (SPIRIVA HANDIHALER) 18 MCG inhalation capsule Place 1 capsule (18 mcg total) into inhaler and inhale daily. 90 capsule 3  . traZODone (DESYREL) 50 MG tablet Take 50 mg by mouth at bedtime as needed for sleep.     . valACYclovir (VALTREX) 1000 MG tablet TAKE 2 TABLETS BY MOUTH 2 TIMES A DAY 20  tablet 3  . valACYclovir (VALTREX) 500 MG tablet Take 1 tablet (500 mg total) by mouth 3 (three) times daily. 21 tablet 0  . VITAMIN D, CHOLECALCIFEROL, PO Take 1 tablet by mouth daily.     Current Facility-Administered Medications on File Prior to Visit  Medication Dose Route Frequency Provider Last Rate Last Dose  . ipratropium-albuterol (DUONEB) 0.5-2.5 (3) MG/3ML nebulizer solution 3 mL  3 mL Nebulization Q6H Susy Frizzle, MD       Allergies  Allergen Reactions  . Doxycycline Nausea Only  . Erythromycin Nausea Only  . Sulfonamide Derivatives Hives   Social History   Social History  . Marital Status: Married    Spouse Name: N/A  . Number of Children:  3  . Years of Education: N/A   Occupational History  . retired Marine scientist    Social History Main Topics  . Smoking status: Former Smoker -- 0.50 packs/day for 25 years    Types: Cigarettes    Quit date: 09/11/2004  . Smokeless tobacco: Never Used  . Alcohol Use: Yes     Comment: social  . Drug Use: No  . Sexual Activity: Not on file   Other Topics Concern  . Not on file   Social History Narrative      Review of Systems  All other systems reviewed and are negative.      Objective:   Physical Exam  Constitutional: She appears well-developed and well-nourished.  HENT:  Right Ear: External ear normal.  Left Ear: External ear normal.  Nose: Nose normal.  Mouth/Throat: Oropharynx is clear and moist.  Neck: Neck supple.  Cardiovascular: Normal rate, regular rhythm and normal heart sounds.   Pulmonary/Chest: Effort normal. No accessory muscle usage. No tachypnea. She has no decreased breath sounds. She has no wheezes. She has no rhonchi.  Abdominal: Soft. Bowel sounds are normal.  Musculoskeletal: She exhibits no edema.  Lymphadenopathy:    She has no cervical adenopathy.  Vitals reviewed.  Patient has the physical stigmata of Bell's palsy on the right side.        Assessment & Plan:  COPD with asthma  ([place]) - Plan: Fluticasone-Salmeterol (ADVAIR DISKUS) 250-50 MCG/DOSE AEPB, DG Chest 2 View  Much improved.  DC prednisone.  Use albuterol only if needed.  Continue Spiriva and Symbicort for 2 weeks. Then discontinue spiriva.   I would like to continue Symbicort long-term. Her insurance will not cover Symbicort and therefore I will switch her to Advair 250/50 twice a day

## 2015-06-21 ENCOUNTER — Other Ambulatory Visit: Payer: Self-pay | Admitting: Family Medicine

## 2015-06-21 DIAGNOSIS — J449 Chronic obstructive pulmonary disease, unspecified: Secondary | ICD-10-CM

## 2015-06-21 MED ORDER — FLUTICASONE-SALMETEROL 250-50 MCG/DOSE IN AEPB: 1.0000 | INHALATION_SPRAY | Freq: Two times a day (BID) | RESPIRATORY_TRACT | Status: DC

## 2015-06-22 DIAGNOSIS — Z882 Allergy status to sulfonamides status: Secondary | ICD-10-CM | POA: Diagnosis not present

## 2015-06-22 DIAGNOSIS — Z23 Encounter for immunization: Secondary | ICD-10-CM | POA: Diagnosis not present

## 2015-06-22 DIAGNOSIS — Z08 Encounter for follow-up examination after completed treatment for malignant neoplasm: Secondary | ICD-10-CM | POA: Diagnosis not present

## 2015-06-22 DIAGNOSIS — G510 Bell's palsy: Secondary | ICD-10-CM | POA: Diagnosis not present

## 2015-06-22 DIAGNOSIS — R0602 Shortness of breath: Secondary | ICD-10-CM | POA: Diagnosis not present

## 2015-06-22 DIAGNOSIS — Z85118 Personal history of other malignant neoplasm of bronchus and lung: Secondary | ICD-10-CM | POA: Diagnosis not present

## 2015-06-22 DIAGNOSIS — J45909 Unspecified asthma, uncomplicated: Secondary | ICD-10-CM | POA: Diagnosis not present

## 2015-06-22 DIAGNOSIS — C3412 Malignant neoplasm of upper lobe, left bronchus or lung: Secondary | ICD-10-CM | POA: Diagnosis not present

## 2015-06-22 DIAGNOSIS — Z881 Allergy status to other antibiotic agents status: Secondary | ICD-10-CM | POA: Diagnosis not present

## 2015-06-22 DIAGNOSIS — Z79899 Other long term (current) drug therapy: Secondary | ICD-10-CM | POA: Diagnosis not present

## 2015-06-22 DIAGNOSIS — C3431 Malignant neoplasm of lower lobe, right bronchus or lung: Secondary | ICD-10-CM | POA: Diagnosis not present

## 2015-06-22 DIAGNOSIS — R0609 Other forms of dyspnea: Secondary | ICD-10-CM | POA: Diagnosis not present

## 2015-06-22 DIAGNOSIS — R911 Solitary pulmonary nodule: Secondary | ICD-10-CM | POA: Diagnosis not present

## 2015-06-29 ENCOUNTER — Telehealth: Payer: Self-pay | Admitting: Family Medicine

## 2015-06-29 NOTE — Telephone Encounter (Signed)
Patient is calling about getting pre medicated before her dentist appointment tomorrow  She would like cephalexin called in if possible   [phone] [facility]

## 2015-06-29 NOTE — Telephone Encounter (Signed)
Amoxicillin 2 g po x 1, 1 hour prior to dentist.

## 2015-06-30 MED ORDER — AMOXICILLIN 500 MG PO TABS: ORAL_TABLET | ORAL | Status: DC

## 2015-06-30 NOTE — Telephone Encounter (Signed)
Medication called/sent to requested pharmacy  

## 2015-07-01 ENCOUNTER — Ambulatory Visit (HOSPITAL_BASED_OUTPATIENT_CLINIC_OR_DEPARTMENT_OTHER): Payer: Medicare Other | Attending: Family Medicine

## 2015-07-01 VITALS — Ht 67.0 in | Wt 150.0 lb

## 2015-07-01 DIAGNOSIS — R0683 Snoring: Secondary | ICD-10-CM | POA: Insufficient documentation

## 2015-07-01 DIAGNOSIS — R5383 Other fatigue: Secondary | ICD-10-CM | POA: Insufficient documentation

## 2015-07-01 DIAGNOSIS — G4734 Idiopathic sleep related nonobstructive alveolar hypoventilation: Secondary | ICD-10-CM

## 2015-07-04 ENCOUNTER — Encounter (HOSPITAL_BASED_OUTPATIENT_CLINIC_OR_DEPARTMENT_OTHER): Payer: Medicare Other | Admitting: Internal Medicine

## 2015-07-04 DIAGNOSIS — G4734 Idiopathic sleep related nonobstructive alveolar hypoventilation: Secondary | ICD-10-CM

## 2015-07-04 NOTE — Progress Notes (Signed)
  Patient Name: [name], [name] Date: 07/01/2015 Gender: Female D.O.B: 05-14-1941 Age (years): 77 Referring Provider: Cletus Gash T. Pickard Height (inches): 67 Interpreting Physician: Baird Lyons MD, ABSM Weight (lbs): 150 RPSGT: [place], [place] BMI: 23 MRN: [id number] Neck Size: 14.50 CLINICAL INFORMATION Sleep Study Type: NPSG Indication for sleep study: Fatigue, Snoring Epworth Sleepiness Score:7/24 SLEEP STUDY TECHNIQUE As per the AASM Manual for the Scoring of Sleep and Associated Events v2.3 (April 2016) with a hypopnea requiring 4% desaturations. The channels recorded and monitored were frontal, central and occipital EEG, electrooculogram (EOG), submentalis EMG (chin), nasal and oral airflow, thoracic and abdominal wall motion, anterior tibialis EMG, snore microphone, electrocardiogram, and pulse oximetry.  MEDICATIONS Patient's medications include: charted for review. Medications self-administered by patient during sleep study : trazodone  SLEEP ARCHITECTURE The study was initiated at 11:13:20 PM and ended at 5:16:27 AM. Sleep onset time was 17.7 minutes and the sleep efficiency was 69.4%. The total sleep time was 252.0 minutes. Stage REM latency was 312.0 minutes. The patient spent 10.12% of the night in stage N1 sleep, 82.94% in stage N2 sleep, 0.00% in stage N3 and 6.94% in REM. Alpha intrusion was absent. Supine sleep was 31.03%. Wake after sleep onset 93.4 minutes  RESPIRATORY PARAMETERS The overall apnea/hypopnea index (AHI) was 1.9 per hour. There were 0 total apneas, including 0 obstructive, 0 central and 0 mixed apneas. There were 8 hypopneas and 7 RERAs. The AHI during Stage REM sleep was 20.6 per hour. AHI while supine was 6.1 per hour. The mean oxygen saturation was 93.34%. The minimum SpO2 during sleep was 87.00%. Moderate snoring was noted during this study.  CARDIAC DATA The 2 lead EKG demonstrated sinus rhythm. The mean heart rate was 63.74  beats per minute. Other EKG findings include: None.  LEG MOVEMENT DATA The total PLMS were 0 with a resulting PLMS index of 0.00. Associated arousal with leg movement index was 0.0 .  IMPRESSIONS - No significant obstructive sleep apnea occurred during this study (AHI = 1.9/h). - No significant central sleep apnea occurred during this study (CAI = 0.0/h). - Mild oxygen desaturation was noted during this study (Min O2 = 87.00%). - The patient snored with Moderate snoring volume. - No cardiac abnormalities were noted during this study. - Clinically significant periodic limb movements did not occur during sleep. No significant associated arousals. - Sleep was fragmented with some difficulty maintaining sleep despite trazodone. Awake after sleep onset 93.4 minutes  DIAGNOSIS - Primary Snoring (786.09 [R06.83 ICD-10]) - Normal study  RECOMMENDATIONS - Avoid alcohol, sedatives and other CNS depressants that may worsen sleep apnea and disrupt normal sleep architecture. - Sleep hygiene should be reviewed to assess factors that may improve sleep quality. - Weight management and regular exercise should be initiated or continued if appropriate.  Deneise Lever Diplomate, American Board of Sleep Medicine  ELECTRONICALLY SIGNED ON:  07/04/2015, 2:27 PM [place] PH: [phone]   FX: [phone] [place]

## 2015-07-05 DIAGNOSIS — H35033 Hypertensive retinopathy, bilateral: Secondary | ICD-10-CM | POA: Diagnosis not present

## 2015-07-05 DIAGNOSIS — H04123 Dry eye syndrome of bilateral lacrimal glands: Secondary | ICD-10-CM | POA: Diagnosis not present

## 2015-07-05 DIAGNOSIS — Z961 Presence of intraocular lens: Secondary | ICD-10-CM | POA: Diagnosis not present

## 2015-07-05 DIAGNOSIS — H40013 Open angle with borderline findings, low risk, bilateral: Secondary | ICD-10-CM | POA: Diagnosis not present

## 2015-07-26 ENCOUNTER — Other Ambulatory Visit: Payer: Self-pay | Admitting: *Deleted

## 2015-07-26 ENCOUNTER — Ambulatory Visit (INDEPENDENT_AMBULATORY_CARE_PROVIDER_SITE_OTHER): Payer: Medicare Other | Admitting: Family Medicine

## 2015-07-26 ENCOUNTER — Encounter: Payer: Self-pay | Admitting: Family Medicine

## 2015-07-26 DIAGNOSIS — J441 Chronic obstructive pulmonary disease with (acute) exacerbation: Secondary | ICD-10-CM | POA: Diagnosis not present

## 2015-07-26 DIAGNOSIS — G4734 Idiopathic sleep related nonobstructive alveolar hypoventilation: Secondary | ICD-10-CM

## 2015-07-26 DIAGNOSIS — G510 Bell's palsy: Secondary | ICD-10-CM | POA: Diagnosis not present

## 2015-07-26 MED ORDER — LEVOFLOXACIN 500 MG PO TABS: 500.0000 mg | ORAL_TABLET | Freq: Every day | ORAL | Status: DC

## 2015-07-26 MED ORDER — IPRATROPIUM-ALBUTEROL 0.5-2.5 (3) MG/3ML IN SOLN
3.0000 mL | Freq: Once | RESPIRATORY_TRACT | Status: AC
  Administered 2015-07-26: 3 mL via RESPIRATORY_TRACT

## 2015-07-26 MED ORDER — ALBUTEROL SULFATE (2.5 MG/3ML) 0.083% IN NEBU
2.5000 mg | INHALATION_SOLUTION | RESPIRATORY_TRACT | Status: DC | PRN
Start: 2015-07-26 — End: 2015-08-13

## 2015-07-26 MED ORDER — METHYLPREDNISOLONE ACETATE 80 MG/ML IJ SUSP
80.0000 mg | Freq: Once | INTRAMUSCULAR | Status: AC
  Administered 2015-07-26: 80 mg via INTRAMUSCULAR

## 2015-07-26 MED ORDER — PREDNISONE 20 MG PO TABS: ORAL_TABLET | ORAL | Status: DC

## 2015-07-26 NOTE — Patient Instructions (Signed)
Take the prednisone starting tomorrow Mucinex DM  Use nebulizer every 4 hours while awake Antibiotics given F/U Wed for recheck

## 2015-07-26 NOTE — Progress Notes (Signed)
Patient ID: [name], female   DOB: 06-Oct-1940, 74 y.o.   MRN: [id number]   Subjective:    Patient ID: [name], female    DOB: 04/25/1941, 74 y.o.   MRN: [id number]  Patient presents for Illness  Pt here with husband. She recently returned from [place] and since then she has had cough with production wheezing and chest tightness. She also had a low-grade fever. This feels like her typical COPD however she has had pneumonia in the past as well as history of lung cancer. She was also concerned about her breathing she states at night it is very difficult to breathe. She had a sleep study done was seen by pulmonologist but she does not have the results of anything. She was not prescribed any home oxygen either. She tried using her albuterol nebulizers yesterday which helps some. She does have her Advair and her Spiriva    Review Of Systems:  GEN-+ fatigue,+ fever, weight loss,weakness, recent illness HEENT- denies eye drainage, change in vision, nasal discharge, CVS- denies chest pain, palpitations RESP-+SOB, +cough, +wheeze ABD- denies N/V, change in stools, abd pain Neuro- denies headache, dizziness, syncope, seizure activity       Objective:    BP 128/78 mmHg  Pulse 70  Temp(Src) 98.2 F (36.8 C) (Oral)  Resp 18  Ht 5' 7.5" (1.715 m)  Wt 159 lb (72.122 kg)  BMI 24.52 kg/m2  SpO2 96% GEN- NAD, alert and oriented x3 HEENT- PERRL, EOMI, non injected sclera, pink conjunctiva, MMM, oropharynx clear Neck- Supple, no LAD CVS- RRR, no murmur RESP- bilat wheeze, bronchospasm, decreased air movement, rhonchi bilat, mild retractions EXT- No edema Pulses- Radial  2+        Assessment & Plan:      Problem List Items Addressed This Visit    Nocturnal hypoxia   Relevant Medications   predniSONE (DELTASONE) 20 MG tablet   albuterol (PROVENTIL) (2.5 MG/3ML) 0.083% nebulizer solution   levofloxacin (LEVAQUIN) 500 MG tablet   ipratropium-albuterol (DUONEB) 0.5-2.5  (3) MG/3ML nebulizer solution 3 mL (Completed)   methylPREDNISolone acetate (DEPO-MEDROL) injection 80 mg (Completed)    Other Visit Diagnoses    COPD exacerbation (HCC)        S/P neb and Depo Medrol '80mg'$  IM, improved WOB, fortunetley her sats did not drop in the office. will treat with nebs, prednisone taper, Levaquin, high risk for PNA, mucinex    Relevant Medications    predniSONE (DELTASONE) 20 MG tablet    albuterol (PROVENTIL) (2.5 MG/3ML) 0.083% nebulizer solution    levofloxacin (LEVAQUIN) 500 MG tablet    ipratropium-albuterol (DUONEB) 0.5-2.5 (3) MG/3ML nebulizer solution 3 mL (Completed)    methylPREDNISolone acetate (DEPO-MEDROL) injection 80 mg (Completed)       Note: This dictation was prepared with Dragon dictation along with smaller phrase technology. Any transcriptional errors that result from this process are unintentional.

## 2015-07-28 ENCOUNTER — Ambulatory Visit (INDEPENDENT_AMBULATORY_CARE_PROVIDER_SITE_OTHER): Payer: Medicare Other | Admitting: Family Medicine

## 2015-07-28 ENCOUNTER — Encounter: Payer: Self-pay | Admitting: Family Medicine

## 2015-07-28 ENCOUNTER — Encounter (HOSPITAL_COMMUNITY): Payer: Self-pay | Admitting: *Deleted

## 2015-07-28 ENCOUNTER — Emergency Department (HOSPITAL_COMMUNITY): Payer: Medicare Other

## 2015-07-28 ENCOUNTER — Inpatient Hospital Stay (HOSPITAL_COMMUNITY)
Admission: EM | Admit: 2015-07-28 | Discharge: 2015-07-31 | DRG: 189 | Disposition: A | Discharge status: Home / Self Care | Payer: Medicare Other | Attending: Internal Medicine | Admitting: Internal Medicine

## 2015-07-28 VITALS — BP 134/78 | HR 84 | Temp 97.9°F | Resp 24 | Ht 67.5 in | Wt 159.0 lb

## 2015-07-28 DIAGNOSIS — Z66 Do not resuscitate: Secondary | ICD-10-CM | POA: Diagnosis present

## 2015-07-28 DIAGNOSIS — E039 Hypothyroidism, unspecified: Secondary | ICD-10-CM | POA: Diagnosis not present

## 2015-07-28 DIAGNOSIS — J9601 Acute respiratory failure with hypoxia: Secondary | ICD-10-CM | POA: Diagnosis not present

## 2015-07-28 DIAGNOSIS — G4734 Idiopathic sleep related nonobstructive alveolar hypoventilation: Secondary | ICD-10-CM

## 2015-07-28 DIAGNOSIS — I5032 Chronic diastolic (congestive) heart failure: Secondary | ICD-10-CM | POA: Diagnosis not present

## 2015-07-28 DIAGNOSIS — Z85118 Personal history of other malignant neoplasm of bronchus and lung: Secondary | ICD-10-CM

## 2015-07-28 DIAGNOSIS — C3490 Malignant neoplasm of unspecified part of unspecified bronchus or lung: Secondary | ICD-10-CM | POA: Diagnosis present

## 2015-07-28 DIAGNOSIS — J441 Chronic obstructive pulmonary disease with (acute) exacerbation: Secondary | ICD-10-CM | POA: Diagnosis not present

## 2015-07-28 DIAGNOSIS — F411 Generalized anxiety disorder: Secondary | ICD-10-CM | POA: Diagnosis not present

## 2015-07-28 DIAGNOSIS — R0602 Shortness of breath: Secondary | ICD-10-CM | POA: Diagnosis not present

## 2015-07-28 DIAGNOSIS — Z515 Encounter for palliative care: Secondary | ICD-10-CM | POA: Insufficient documentation

## 2015-07-28 DIAGNOSIS — I2781 Cor pulmonale (chronic): Secondary | ICD-10-CM | POA: Diagnosis present

## 2015-07-28 DIAGNOSIS — D751 Secondary polycythemia: Secondary | ICD-10-CM | POA: Diagnosis not present

## 2015-07-28 DIAGNOSIS — Z720 Tobacco use: Secondary | ICD-10-CM | POA: Diagnosis present

## 2015-07-28 DIAGNOSIS — F17210 Nicotine dependence, cigarettes, uncomplicated: Secondary | ICD-10-CM | POA: Diagnosis present

## 2015-07-28 DIAGNOSIS — Z902 Acquired absence of lung [part of]: Secondary | ICD-10-CM

## 2015-07-28 DIAGNOSIS — R0789 Other chest pain: Secondary | ICD-10-CM | POA: Diagnosis not present

## 2015-07-28 HISTORY — DX: Nonrheumatic mitral (valve) prolapse: I34.1

## 2015-07-28 HISTORY — DX: Chronic or unspecified gastric ulcer with hemorrhage: K25.4

## 2015-07-28 HISTORY — DX: Personal history of other medical treatment: Z92.89

## 2015-07-28 HISTORY — DX: Squamous cell carcinoma of skin, unspecified: C44.92

## 2015-07-28 HISTORY — DX: Unspecified osteoarthritis, unspecified site: M19.90

## 2015-07-28 HISTORY — DX: Cardiac murmur, unspecified: R01.1

## 2015-07-28 HISTORY — DX: Hypothyroidism, unspecified: E03.9

## 2015-07-28 HISTORY — DX: Sleep apnea, unspecified: G47.30

## 2015-07-28 HISTORY — DX: Unspecified asthma, uncomplicated: J45.909

## 2015-07-28 LAB — CBC WITH DIFFERENTIAL/PLATELET
Basophils Absolute: 0 10*3/uL (ref 0.0–0.1)
Basophils Relative: 0 %
Eosinophils Absolute: 0 10*3/uL (ref 0.0–0.7)
Eosinophils Relative: 0 %
HCT: 45.7 % (ref 36.0–46.0)
Hemoglobin: 15.8 g/dL — ABNORMAL HIGH (ref 12.0–15.0)
Lymphocytes Relative: 12 %
Lymphs Abs: 1.2 10*3/uL (ref 0.7–4.0)
MCH: 35 pg — ABNORMAL HIGH (ref 26.0–34.0)
MCHC: 34.6 g/dL (ref 30.0–36.0)
MCV: 101.3 fL — ABNORMAL HIGH (ref 78.0–100.0)
Monocytes Absolute: 0.9 10*3/uL (ref 0.1–1.0)
Monocytes Relative: 9 %
Neutro Abs: 7.6 10*3/uL (ref 1.7–7.7)
Neutrophils Relative %: 79 %
Platelets: 216 10*3/uL (ref 150–400)
RBC: 4.51 MIL/uL (ref 3.87–5.11)
RDW: 14 % (ref 11.5–15.5)
WBC: 9.7 10*3/uL (ref 4.0–10.5)

## 2015-07-28 LAB — BASIC METABOLIC PANEL
Anion gap: 11 (ref 5–15)
BUN: 11 mg/dL (ref 6–20)
CO2: 23 mmol/L (ref 22–32)
Calcium: 9.3 mg/dL (ref 8.9–10.3)
Chloride: 103 mmol/L (ref 101–111)
Creatinine, Ser: 0.68 mg/dL (ref 0.44–1.00)
GFR calc Af Amer: >60 mL/min (ref >60)
GFR calc non Af Amer: >60 mL/min (ref >60)
Glucose, Bld: 129 mg/dL — ABNORMAL HIGH (ref 65–99)
Potassium: 4.6 mmol/L (ref 3.5–5.1)
Sodium: 137 mmol/L (ref 135–145)

## 2015-07-28 LAB — INFLUENZA PANEL BY PCR (TYPE A & B)
H1N1 flu by pcr: NOT DETECTED
Influenza A By PCR: NEGATIVE
Influenza B By PCR: NEGATIVE

## 2015-07-28 LAB — TSH: TSH: 1.171 u[IU]/mL (ref 0.350–4.500)

## 2015-07-28 MED ORDER — METHYLPREDNISOLONE SODIUM SUCC 125 MG IJ SOLR
60.0000 mg | Freq: Four times a day (QID) | INTRAMUSCULAR | Status: DC
  Administered 2015-07-28 – 2015-07-29 (×4): 60 mg via INTRAVENOUS
  Filled 2015-07-28 (×4): qty 2

## 2015-07-28 MED ORDER — IPRATROPIUM-ALBUTEROL 0.5-2.5 (3) MG/3ML IN SOLN
3.0000 mL | Freq: Four times a day (QID) | RESPIRATORY_TRACT | Status: DC
  Administered 2015-07-28 – 2015-07-29 (×4): 3 mL via RESPIRATORY_TRACT
  Filled 2015-07-28 (×3): qty 3

## 2015-07-28 MED ORDER — LISINOPRIL 10 MG PO TABS
10.0000 mg | ORAL_TABLET | Freq: Every day | ORAL | Status: DC
  Administered 2015-07-29 – 2015-07-31 (×3): 10 mg via ORAL
  Filled 2015-07-28 (×3): qty 1

## 2015-07-28 MED ORDER — BUPROPION HCL ER (XL) 150 MG PO TB24
150.0000 mg | ORAL_TABLET | Freq: Every day | ORAL | Status: DC
  Administered 2015-07-29 – 2015-07-31 (×3): 150 mg via ORAL
  Filled 2015-07-28 (×4): qty 1

## 2015-07-28 MED ORDER — IBUPROFEN 400 MG PO TABS
400.0000 mg | ORAL_TABLET | Freq: Four times a day (QID) | ORAL | Status: AC | PRN
  Administered 2015-07-29 (×2): 400 mg via ORAL
  Filled 2015-07-28: qty 2
  Filled 2015-07-28: qty 1

## 2015-07-28 MED ORDER — GUAIFENESIN-DM 100-10 MG/5ML PO SYRP
5.0000 mL | ORAL_SOLUTION | ORAL | Status: DC | PRN
Start: 2015-07-28 — End: 2015-07-31

## 2015-07-28 MED ORDER — TRAZODONE HCL 50 MG PO TABS: 50.0000 mg | ORAL_TABLET | Freq: Every evening | ORAL | Status: DC | PRN

## 2015-07-28 MED ORDER — LISINOPRIL-HYDROCHLOROTHIAZIDE 10-12.5 MG PO TABS: 1.0000 | ORAL_TABLET | Freq: Every day | ORAL | Status: DC

## 2015-07-28 MED ORDER — LEVOFLOXACIN 500 MG PO TABS
500.0000 mg | ORAL_TABLET | Freq: Every day | ORAL | Status: DC
  Administered 2015-07-28 – 2015-07-30 (×3): 500 mg via ORAL
  Filled 2015-07-28 (×3): qty 1

## 2015-07-28 MED ORDER — ALPRAZOLAM 0.5 MG PO TABS
0.5000 mg | ORAL_TABLET | Freq: Every evening | ORAL | Status: DC | PRN
  Administered 2015-07-29 – 2015-07-30 (×2): 0.5 mg via ORAL
  Filled 2015-07-28 (×3): qty 1

## 2015-07-28 MED ORDER — SODIUM CHLORIDE 0.9 % IV SOLN: 250.0000 mL | INTRAVENOUS | Status: DC | PRN

## 2015-07-28 MED ORDER — METHYLPREDNISOLONE SODIUM SUCC 125 MG IJ SOLR
125.0000 mg | Freq: Once | INTRAMUSCULAR | Status: AC
  Administered 2015-07-28: 125 mg via INTRAVENOUS
  Filled 2015-07-28: qty 2

## 2015-07-28 MED ORDER — CELECOXIB 200 MG PO CAPS
200.0000 mg | ORAL_CAPSULE | Freq: Every day | ORAL | Status: DC
  Administered 2015-07-29 – 2015-07-31 (×3): 200 mg via ORAL
  Filled 2015-07-28 (×4): qty 1

## 2015-07-28 MED ORDER — ACETAMINOPHEN 650 MG RE SUPP: 650.0000 mg | Freq: Four times a day (QID) | RECTAL | Status: DC | PRN

## 2015-07-28 MED ORDER — ONDANSETRON HCL 4 MG PO TABS: 4.0000 mg | ORAL_TABLET | Freq: Four times a day (QID) | ORAL | Status: DC | PRN

## 2015-07-28 MED ORDER — BUDESONIDE 0.25 MG/2ML IN SUSP
0.2500 mg | Freq: Two times a day (BID) | RESPIRATORY_TRACT | Status: DC
  Administered 2015-07-28 – 2015-07-31 (×6): 0.25 mg via RESPIRATORY_TRACT
  Filled 2015-07-28 (×6): qty 2

## 2015-07-28 MED ORDER — SODIUM CHLORIDE 0.9 % IJ SOLN: 3.0000 mL | INTRAMUSCULAR | Status: DC | PRN

## 2015-07-28 MED ORDER — ACETAMINOPHEN 325 MG PO TABS
650.0000 mg | ORAL_TABLET | Freq: Four times a day (QID) | ORAL | Status: DC | PRN
Start: 2015-07-28 — End: 2015-07-31

## 2015-07-28 MED ORDER — HYDROCHLOROTHIAZIDE 12.5 MG PO CAPS
12.5000 mg | ORAL_CAPSULE | Freq: Every day | ORAL | Status: DC
  Administered 2015-07-29 – 2015-07-31 (×3): 12.5 mg via ORAL
  Filled 2015-07-28 (×3): qty 1

## 2015-07-28 MED ORDER — ENSURE ENLIVE PO LIQD: 237.0000 mL | Freq: Two times a day (BID) | ORAL | Status: DC

## 2015-07-28 MED ORDER — FLUTICASONE PROPIONATE 50 MCG/ACT NA SUSP
2.0000 | Freq: Every day | NASAL | Status: DC
  Administered 2015-07-29 – 2015-07-31 (×3): 2 via NASAL
  Filled 2015-07-28: qty 16

## 2015-07-28 MED ORDER — ERYTHROMYCIN 5 MG/GM OP OINT
1.0000 "application " | TOPICAL_OINTMENT | Freq: Two times a day (BID) | OPHTHALMIC | Status: DC
  Administered 2015-07-28 – 2015-07-31 (×5): 1 via OPHTHALMIC
  Filled 2015-07-28: qty 3.5

## 2015-07-28 MED ORDER — ENOXAPARIN SODIUM 30 MG/0.3ML ~~LOC~~ SOLN
30.0000 mg | SUBCUTANEOUS | Status: DC
  Administered 2015-07-28: 30 mg via SUBCUTANEOUS
  Filled 2015-07-28: qty 0.3

## 2015-07-28 MED ORDER — LORATADINE 10 MG PO TABS
10.0000 mg | ORAL_TABLET | Freq: Every day | ORAL | Status: DC
  Administered 2015-07-29 – 2015-07-31 (×3): 10 mg via ORAL
  Filled 2015-07-28 (×3): qty 1

## 2015-07-28 MED ORDER — ONDANSETRON HCL 4 MG/2ML IJ SOLN: 4.0000 mg | Freq: Four times a day (QID) | INTRAMUSCULAR | Status: DC | PRN

## 2015-07-28 MED ORDER — OXYCODONE-ACETAMINOPHEN 5-325 MG PO TABS
1.0000 | ORAL_TABLET | Freq: Four times a day (QID) | ORAL | Status: DC | PRN
  Administered 2015-07-28: 1 via ORAL
  Filled 2015-07-28: qty 1

## 2015-07-28 MED ORDER — SODIUM CHLORIDE 0.9 % IJ SOLN
3.0000 mL | Freq: Two times a day (BID) | INTRAMUSCULAR | Status: DC
  Administered 2015-07-28 – 2015-07-30 (×6): 3 mL via INTRAVENOUS

## 2015-07-28 MED ORDER — LEVOTHYROXINE SODIUM 100 MCG PO TABS
100.0000 ug | ORAL_TABLET | Freq: Every morning | ORAL | Status: DC
  Administered 2015-07-29 – 2015-07-31 (×3): 100 ug via ORAL
  Filled 2015-07-28 (×3): qty 1

## 2015-07-28 MED ORDER — DULOXETINE HCL 60 MG PO CPEP
60.0000 mg | ORAL_CAPSULE | Freq: Every day | ORAL | Status: DC
  Administered 2015-07-29 – 2015-07-31 (×3): 60 mg via ORAL
  Filled 2015-07-28 (×3): qty 1

## 2015-07-28 MED ORDER — SIMVASTATIN 40 MG PO TABS
40.0000 mg | ORAL_TABLET | Freq: Every day | ORAL | Status: DC
  Administered 2015-07-28 – 2015-07-30 (×3): 40 mg via ORAL
  Filled 2015-07-28 (×2): qty 1
  Filled 2015-07-28: qty 2

## 2015-07-28 MED ORDER — ALBUTEROL (5 MG/ML) CONTINUOUS INHALATION SOLN
10.0000 mg/h | INHALATION_SOLUTION | Freq: Once | RESPIRATORY_TRACT | Status: AC
  Administered 2015-07-28: 10 mg/h via RESPIRATORY_TRACT
  Filled 2015-07-28: qty 20

## 2015-07-28 MED ORDER — RENA-VITE PO TABS
1.0000 | ORAL_TABLET | Freq: Every day | ORAL | Status: DC
  Administered 2015-07-28 – 2015-07-30 (×3): 1 via ORAL
  Filled 2015-07-28 (×3): qty 1

## 2015-07-28 MED ORDER — IPRATROPIUM BROMIDE 0.02 % IN SOLN
0.5000 mg | Freq: Once | RESPIRATORY_TRACT | Status: AC
  Administered 2015-07-28: 0.5 mg via RESPIRATORY_TRACT
  Filled 2015-07-28: qty 2.5

## 2015-07-28 MED ORDER — ASPIRIN EC 81 MG PO TBEC
81.0000 mg | DELAYED_RELEASE_TABLET | ORAL | Status: DC
  Administered 2015-07-30: 81 mg via ORAL
  Filled 2015-07-28 (×2): qty 1

## 2015-07-28 MED ORDER — BISACODYL 5 MG PO TBEC: 5.0000 mg | DELAYED_RELEASE_TABLET | Freq: Every day | ORAL | Status: DC | PRN

## 2015-07-28 MED ORDER — IPRATROPIUM-ALBUTEROL 0.5-2.5 (3) MG/3ML IN SOLN
3.0000 mL | Freq: Once | RESPIRATORY_TRACT | Status: AC
  Administered 2015-07-28: 3 mL via RESPIRATORY_TRACT

## 2015-07-28 MED ORDER — ALUM & MAG HYDROXIDE-SIMETH 200-200-20 MG/5ML PO SUSP: 30.0000 mL | Freq: Four times a day (QID) | ORAL | Status: DC | PRN

## 2015-07-28 NOTE — Addendum Note (Signed)
Addended by: Sheral Flow on: 07/28/2015 09:33 AM   Modules accepted: Orders

## 2015-07-28 NOTE — Progress Notes (Signed)
Patient ID: [name], female   DOB: 10/28/1940, 74 y.o.   MRN: [id number]   Subjective:    Patient ID: [name], female    DOB: Nov 19, 1940, 74 y.o.   MRN: [id number]  Patient presents for F/U   Pt here for COPD exacerbation f/u, seen on Monday 11/14, given Depo Medrol '80mg'$  IM, Prednisone taper, Levaquin, albuterol nebs, has spiriva and Advair   She has been using nebs every 4 hours but still very winded and SOB, we attempted to get oxygen via Sleep study showing nocturnal hypoxia but this was not covered. Today in office she was 86% on RA, came up to 95% with 2L of oxygen.     Review Of Systems:  GEN-+ fatigue, fever, weight loss,weakness, recent illness HEENT- denies eye drainage, change in vision, nasal discharge, CVS- denies chest pain, palpitations RESP- +SOB, +cough, +wheeze Neuro- denies headache, dizziness, syncope, seizure activity       Objective:    BP 134/78 mmHg  Pulse 84  Temp(Src) 97.9 F (36.6 C) (Oral)  Resp 24  Ht 5' 7.5" (1.715 m)  Wt 159 lb (72.122 kg)  BMI 24.52 kg/m2  SpO2 86% GEN- NAD, alert and oriented x3,fatgiued appearing HEENT- PERRL, EOMI, non injected sclera, pink conjunctiva, MMM, oropharynx clear Neck- Supple, no LAD CVS- RRR, no murmur RESP- bilat wheeze, bronchospasm, decreased air movement, rhonchi bilat, mild retractions EXT- No edema Pulses- Radial  2+          Assessment & Plan:      Problem List Items Addressed This Visit    None    Visit Diagnoses    COPD exacerbation ([place])    -  Primary    Given Duoneb, placed on 2 L of oxygen, will send to ER, needs IV steroids admission for COPD exacerbation,failed outpaitent regimen.     Acute respiratory failure with hypoxia [facility])        She will need oxygen at discharge       Note: This dictation was prepared with Dragon dictation along with smaller phrase technology. Any transcriptional errors that result from this process are unintentional.

## 2015-07-28 NOTE — H&P (Signed)
[facility] History and Physical  SLYVIA LARTIGUE OXB:[id number] DOB: October 21, 1940 DOA: 07/28/2015  Referring physician: patel PCP: Odette Fraction, MD   Chief Complaint: sob  HPI: [name] is a very pleasant 74 year old female with a past medical history that includes lung cancer with right middle lobe lobectomy several years ago, COPD, hypertension, presents to the emergency department with the chief complaint of gradual worsening shortness of breath and cough. Initial evaluation in the emergency department reveals acute respiratory failure with hypoxia. Patient reports baseline short of breath gradually worsening over the last week. She saw her PCP 2 days ago and was provided with Levaquin and prednisone. She saw no improvement in fact dyspnea on exertion was worse. Associated symptoms include cough nonproductive chills subjective fever. She denies chest pain palpitations headache dizziness syncope or near-syncope. She denies any abdominal pain nausea vomiting diarrhea dysuria hematuria frequency of urgency. She does endorse decreased oral intake. She is not on oxygen at home.  Workup in the emergency department includes basic metabolic panel is unremarkable, please blood count with hemoglobin of 15.8, MCV 101.3 serum glucose 129. Chest x-ray masslike thickening in the LEFT upper lobe corresponds to indeterminate mass on CT of 12/03/2013. If no interval workup, recommend repeat contrast CT to compared to CT 12/03/2013. Hyperinflated lungs EKG Sinus rhythm probable left atrial enlargement abnrm T, consider ischemia, anterolateral lds , changes in v1 and v2 compared to prior tracing.  In the emergency department is afebrile hemodynamically stable hypoxic with oxygen saturation level 86% on room air to Make with respirations 30. He is provided with nebulizer and Solu-Medrol with improvement.   Review of Systems:  10 point review of systems complete and all systems are negative  except as indicated in the history of present illness Past Surgical History  Procedure Laterality Date  . Laparoscopic salpingoopherectomy    . Thoracotomy    . Mitral valve replacement     Social History:  reports that she quit smoking about 10 years ago. Her smoking use included Cigarettes. She has a 12.5 pack-year smoking history. She has never used smokeless tobacco. She reports that she drinks alcohol. She reports that she does not use illicit drugs. Visit home with her husband she's independent with ADLs she just cut back from a [place] cruise. He is not on oxygen at home Allergies  Allergen Reactions  . Doxycycline Nausea Only  . Erythromycin Nausea Only  . Sulfonamide Derivatives Hives    Family History  Problem Relation Age of Onset  . Graves' disease Daughter   . Breast cancer Maternal Aunt   . Heart disease Maternal Aunt   . Hypertension Mother   . Arthritis Maternal Grandmother      Prior to Admission medications   Medication Sig Start Date End Date Taking? Authorizing Provider  albuterol (PROVENTIL) (2.5 MG/3ML) 0.083% nebulizer solution Take 3 mLs (2.5 mg total) by nebulization every 4 (four) hours as needed for wheezing or shortness of breath. 07/26/15  Yes Alycia Rossetti, MD  ALPRAZolam Duanne Moron) 0.5 MG tablet TAKE 1 TABLET BY MOUTH AT BEDTIME AS NEEDED 05/31/15  Yes Susy Frizzle, MD  aspirin 81 MG tablet Take 81 mg by mouth every other day.    Yes Historical Provider, MD  b complex-C-folic acid 1 MG capsule Take 1 capsule by mouth daily.     Yes Historical Provider, MD  buPROPion (WELLBUTRIN XL) 150 MG 24 hr tablet TAKE 1 TABLET DAILY 11/09/14  Yes Susy Frizzle, MD  celecoxib (CELEBREX) 200 MG capsule Take 1 capsule (200 mg total) by mouth daily. 12/16/14  Yes Susy Frizzle, MD  DULoxetine (CYMBALTA) 60 MG capsule TAKE 1 CAPSULE DAILY 12/07/14  Yes Susy Frizzle, MD  erythromycin ophthalmic ointment Place 1 application into the right eye 2 (two) times  daily. 05/25/15  Yes Susy Frizzle, MD  fluticasone (FLONASE) 50 MCG/ACT nasal spray Place 2 sprays into both nostrils daily. 07/24/14  Yes Susy Frizzle, MD  Fluticasone-Salmeterol (ADVAIR DISKUS) 250-50 MCG/DOSE AEPB Inhale 1 puff into the lungs 2 (two) times daily. 06/21/15  Yes Susy Frizzle, MD  levofloxacin (LEVAQUIN) 500 MG tablet Take 1 tablet (500 mg total) by mouth daily. 07/26/15  Yes Alycia Rossetti, MD  levothyroxine (SYNTHROID, LEVOTHROID) 100 MCG tablet TAKE 1 TABLET EVERY MORNING 04/26/15  Yes Susy Frizzle, MD  lisinopril-hydrochlorothiazide (PRINZIDE,ZESTORETIC) 10-12.5 MG per tablet TAKE 1 TABLET DAILY 05/31/15  Yes Susy Frizzle, MD  loratadine (CLARITIN) 10 MG tablet TAKE 1 TABLET DAILY   Yes Collene Gobble, MD  mometasone (ELOCON) 0.1 % ointment Apply topically daily. 05/29/14  Yes Susy Frizzle, MD  oxyCODONE-acetaminophen (PERCOCET/ROXICET) 5-325 MG per tablet Take 1-2 tablets by mouth every 6 (six) hours as needed for severe pain. 12/03/13  Yes Fredia Sorrow, MD  predniSONE (DELTASONE) 20 MG tablet Take 3 daily for 2 days, then 2 daily for 2 days, then 1 daily for 2 days. 07/26/15  Yes Alycia Rossetti, MD  simvastatin (ZOCOR) 40 MG tablet Take 1 tablet (40 mg total) by mouth at bedtime. 04/05/15  Yes Susy Frizzle, MD  traZODone (DESYREL) 50 MG tablet Take 50 mg by mouth at bedtime as needed for sleep.    Yes Historical Provider, MD  VITAMIN D, CHOLECALCIFEROL, PO Take 1 tablet by mouth daily.   Yes Historical Provider, MD   Physical Exam: Filed Vitals:   07/28/15 1030 07/28/15 1036 07/28/15 1100 07/28/15 1130  BP: 123/56  135/68 137/93  Pulse: 69  84 89  Temp:      TempSrc:      Resp: '20  25 18  '$ SpO2: 98% 97% 98% 98%    Wt Readings from Last 3 Encounters:  07/28/15 72.122 kg (159 lb)  07/26/15 72.122 kg (159 lb)  07/02/15 68.04 kg (150 lb)    General:  Appears calm, mild to moderate discomfort Eyes: PERRL, normal lids, irises &  conjunctiva ENT: grossly normal hearing, lips & tongue Neck: no LAD, masses or thyromegaly Cardiovascular: RRR, no m/r/g. No LE edema.  Respiratory: Moderate increased work of breathing with conversation. Breath sounds absent right base fair air flow on the left with scattered rhonchi and expiratory faint wheeze no crackles Abdomen: soft, ntnd positive bowel sounds throughout Skin: no rash or induration seen on limited exam Musculoskeletal: grossly normal tone BUE/BLE Psychiatric: grossly normal mood and affect, speech fluent and appropriate Neurologic: grossly non-focal. Speech clear facial symmetry           Labs on Admission:  Basic Metabolic Panel:  Recent Labs Lab 07/28/15 0957  NA 137  K 4.6  CL 103  CO2 23  GLUCOSE 129*  BUN 11  CREATININE 0.68  CALCIUM 9.3   Liver Function Tests: No results for input(s): AST, ALT, ALKPHOS, BILITOT, PROT, ALBUMIN in the last 168 hours. No results for input(s): LIPASE, AMYLASE in the last 168 hours. No results for input(s): AMMONIA in the last 168 hours. CBC:  Recent Labs Lab 07/28/15 0957  WBC 9.7  NEUTROABS 7.6  HGB 15.8*  HCT 45.7  MCV 101.3*  PLT 216   Cardiac Enzymes: No results for input(s): CKTOTAL, CKMB, CKMBINDEX, TROPONINI in the last 168 hours.  BNP (last 3 results) No results for input(s): BNP in the last 8760 hours.  ProBNP (last 3 results) No results for input(s): PROBNP in the last 8760 hours.  CBG: No results for input(s): GLUCAP in the last 168 hours.  Radiological Exams on Admission: Dg Chest Portable 1 View  07/28/2015  CLINICAL DATA:  Short of breath 4 days EXAM: PORTABLE CHEST 1 VIEW COMPARISON:  06/18/2015, CT 12/03/2013 FINDINGS: Normal cardiac silhouette. There is a band of linear thickening in the LEFT upper lobe similar to comparison exam. This corresponds to a masslike lesion on CT of 12/03/2013. RIGHT lung is relatively clear. Lungs are hyperinflated. IMPRESSION: 1. Masslike thickening in  the LEFT upper lobe corresponds to indeterminate mass on CT of 12/03/2013. If no interval workup, recommend repeat contrast CT to compared to CT 12/03/2013. 2. Hyperinflated lungs. Electronically Signed   By: Suzy Bouchard M.D.   On: 07/28/2015 10:31    EKG: Independently reviewed as noted above  Assessment/Plan Principal Problem:   Acute respiratory failure with hypoxia ([place]) Active Problems:   Lung cancer (H[facility])   Hypothyroidism   COPD exacerbation ([facility])   Polycythemia  #1. Acute respiratory failure with hypoxia related to COPD exacerbation. Patient with history of lung cancer and remote lobectomy on right. Baseline respiratory effort is always somewhat short of breath per patient. She is not on oxygen at home. - Will admit to telemetry. - Will continue nebulizers Solu-Medrol Levaquin. - Will add flutter valve. He is not on oxygen at home. Will wean oxygen as able. - Influenza panel  #2. COPD exacerbation. She does not require home oxygen. Home medications do include Advair. Failed outpatient treatment of Levaquin and prednisone. See #1. Recent pulmonary function tests noted in care [facility] at [facility]. Classified as mild.  -See above -Wean oxygen as able -May need home oxygen  #3. Polycythemia. Chart review indicates somewhat chronic indicating an ongoing hypoxia issue. She's had recent pulmonary function tests.  #4. History lung cancer status post thoracotomy. He's had recent CTs July and October of this year. Results documented in care everywhere. Stable  #5. Hypothyroidism. - Will obtain a TSH    Code Status: DO NOT RESUSCITATE DVT Prophylaxis: Family Communication: Husband at bedside Disposition Plan: Home hopefully to 3 days  Time spent: 40 minutes [facility]

## 2015-07-28 NOTE — ED Provider Notes (Signed)
CSN: [id number]     Arrival date & time 07/28/15  6644 History   First MD Initiated Contact with Patient 07/28/15 [phone]     Chief Complaint  Patient presents with  . Shortness of Breath     (Consider location/radiation/quality/duration/timing/severity/associated sxs/prior Treatment) The history is provided by the patient. No language interpreter was used.  [name] is a 74 y.o female with a history of anemia, hyperlipidemia, lung cancer with right middle lobe lobectomy, COPD and hypertension who presents for worsening shortness of breath over the past week. She denies being on any home oxygen. She states she was seen by her PCP 2 days ago and put on Levaquin and prednisone. After leaving his office she states she felt no better. She reports having episodes of this in the past and was diagnosed with respiratory failure 2 months ago. Worse with exertion. Also reports an intermittent fever over the last 2-3 days. She denies any chest pain, abdominal pain, nausea, vomiting, diarrhea, or dysuria. Former smoker and quit in 2016.  Past Medical History  Diagnosis Date  . Allergic rhinitis   . Anemia   . Diverticula of colon   . Gastric ulcer with hemorrhage   . Esophagitis   . Arrhythmia   . Aseptic necrosis ([place])   . Osteoporosis   . Hyperlipidemia   . Anxiety   . Depression   . COPD (chronic obstructive pulmonary disease) ([place])   . GERD (gastroesophageal reflux disease)   . Hypertension   . Kidney stones   . Heart murmur   . Mitral valve prolapse     "no ORs" (07/28/2015)  . Asthma dxc'd 05/2015  . Pneumonia 1970's; 06/2005  . Sleep apnea dx'd 06/2015    "haven't had followup appt yet" 07/28/2015)  . Hypothyroidism   . History of blood transfusion 06/2005    "related to pneumonia"  . Bleeding stomach ulcer ~ 06/2005  . Arthritis     "hands, neck, right shoulder" (07/28/2015)  . Basal cell cancer   . Lung cancer ([place])   . Squamous cell skin cancer    Past Surgical History   Procedure Laterality Date  . Salpingoophorectomy Left 1988  . Thoracotomy Left 06/2005  . Skin cancer excision      "I've had severl cut off RLE; left foreqrm, nose under nose"  . Hammer toe surgery Right ~ 2010  . Thoracotomy/lobectomy  09/2009; 11/2009    left; right  . Abdominal hysterectomy  1988   Family History  Problem Relation Age of Onset  . Graves' disease Daughter   . Breast cancer Maternal Aunt   . Heart disease Maternal Aunt   . Hypertension Mother   . Arthritis Maternal Grandmother    Social History  Substance Use Topics  . Smoking status: Current Some Day Smoker -- 0.00 packs/day for 43 years    Types: Cigarettes  . Smokeless tobacco: Never Used  . Alcohol Use: 4.2 oz/week    7 Shots of liquor per week   OB History    No data available     Review of Systems  Constitutional: Positive for fever and diaphoresis. Negative for chills.  Respiratory: Positive for apnea, chest tightness and shortness of breath.   Cardiovascular: Negative for chest pain.  Gastrointestinal: Negative for vomiting and abdominal pain.  All other systems reviewed and are negative.     Allergies  Doxycycline; Erythromycin; and Sulfonamide derivatives  Home Medications   Prior to Admission medications   Medication Sig Start Date  End Date Taking? Authorizing Provider  albuterol (PROVENTIL) (2.5 MG/3ML) 0.083% nebulizer solution Take 3 mLs (2.5 mg total) by nebulization every 4 (four) hours as needed for wheezing or shortness of breath. 07/26/15  Yes Alycia Rossetti, MD  ALPRAZolam Duanne Moron) 0.5 MG tablet TAKE 1 TABLET BY MOUTH AT BEDTIME AS NEEDED 05/31/15  Yes Susy Frizzle, MD  aspirin 81 MG tablet Take 81 mg by mouth every other day.    Yes Historical Provider, MD  b complex-C-folic acid 1 MG capsule Take 1 capsule by mouth daily.     Yes Historical Provider, MD  buPROPion (WELLBUTRIN XL) 150 MG 24 hr tablet TAKE 1 TABLET DAILY 11/09/14  Yes Susy Frizzle, MD  celecoxib  (CELEBREX) 200 MG capsule Take 1 capsule (200 mg total) by mouth daily. 12/16/14  Yes Susy Frizzle, MD  DULoxetine (CYMBALTA) 60 MG capsule TAKE 1 CAPSULE DAILY 12/07/14  Yes Susy Frizzle, MD  erythromycin ophthalmic ointment Place 1 application into the right eye 2 (two) times daily. 05/25/15  Yes Susy Frizzle, MD  fluticasone (FLONASE) 50 MCG/ACT nasal spray Place 2 sprays into both nostrils daily. 07/24/14  Yes Susy Frizzle, MD  Fluticasone-Salmeterol (ADVAIR DISKUS) 250-50 MCG/DOSE AEPB Inhale 1 puff into the lungs 2 (two) times daily. 06/21/15  Yes Susy Frizzle, MD  levofloxacin (LEVAQUIN) 500 MG tablet Take 1 tablet (500 mg total) by mouth daily. 07/26/15  Yes Alycia Rossetti, MD  levothyroxine (SYNTHROID, LEVOTHROID) 100 MCG tablet TAKE 1 TABLET EVERY MORNING 04/26/15  Yes Susy Frizzle, MD  lisinopril-hydrochlorothiazide (PRINZIDE,ZESTORETIC) 10-12.5 MG per tablet TAKE 1 TABLET DAILY 05/31/15  Yes Susy Frizzle, MD  loratadine (CLARITIN) 10 MG tablet TAKE 1 TABLET DAILY   Yes Collene Gobble, MD  mometasone (ELOCON) 0.1 % ointment Apply topically daily. 05/29/14  Yes Susy Frizzle, MD  oxyCODONE-acetaminophen (PERCOCET/ROXICET) 5-325 MG per tablet Take 1-2 tablets by mouth every 6 (six) hours as needed for severe pain. 12/03/13  Yes Fredia Sorrow, MD  predniSONE (DELTASONE) 20 MG tablet Take 3 daily for 2 days, then 2 daily for 2 days, then 1 daily for 2 days. 07/26/15  Yes Alycia Rossetti, MD  simvastatin (ZOCOR) 40 MG tablet Take 1 tablet (40 mg total) by mouth at bedtime. 04/05/15  Yes Susy Frizzle, MD  traZODone (DESYREL) 50 MG tablet Take 50 mg by mouth at bedtime as needed for sleep.    Yes Historical Provider, MD  VITAMIN D, CHOLECALCIFEROL, PO Take 1 tablet by mouth daily.   Yes Historical Provider, MD   BP 134/63 mmHg  Pulse 64  Temp(Src) 97.5 F (36.4 C) (Oral)  Resp 18  Ht '5\' 7"'$  (1.702 m)  Wt 159 lb 6.3 oz (72.3 kg)  BMI 24.96 kg/m2  SpO2  97% Physical Exam  Constitutional: She is oriented to person, place, and time. She appears well-developed and well-nourished.  Diaphoretic.  HENT:  Head: Normocephalic and atraumatic.  Eyes: Conjunctivae are normal.  Neck: Normal range of motion. Neck supple.  Cardiovascular: Normal rate, regular rhythm and normal heart sounds.   Pulmonary/Chest: Accessory muscle usage present. She is in respiratory distress. She has rales.  Crackles. Decreased breath sounds. Accessory muscle usage. Respiratory distress. She was 86% oxygen on room air on arrival. She is now at 98% on 2 L at bedside.  Able to speak partial sentences. No drooling.   Abdominal: Soft. There is no tenderness.  Musculoskeletal: Normal range of motion.  Neurological: She is alert and oriented to person, place, and time.  Skin: Skin is warm. She is diaphoretic.  Nursing note and vitals reviewed.   ED Course  Procedures (including critical care time) Labs Review Labs Reviewed  CBC WITH DIFFERENTIAL/PLATELET - Abnormal; Notable for the following:    Hemoglobin 15.8 (*)    MCV 101.3 (*)    MCH 35.0 (*)    All other components within normal limits  BASIC METABOLIC PANEL - Abnormal; Notable for the following:    Glucose, Bld 129 (*)    All other components within normal limits  BASIC METABOLIC PANEL - Abnormal; Notable for the following:    Glucose, Bld 152 (*)    All other components within normal limits  CBC - Abnormal; Notable for the following:    WBC 10.8 (*)    MCV 101.2 (*)    MCH 34.4 (*)    All other components within normal limits  INFLUENZA PANEL BY PCR (TYPE A & B, H1N1)  TSH  BRAIN NATRIURETIC PEPTIDE    Imaging Review Dg Chest Portable 1 View  07/28/2015  CLINICAL DATA:  Short of breath 4 days EXAM: PORTABLE CHEST 1 VIEW COMPARISON:  06/18/2015, CT 12/03/2013 FINDINGS: Normal cardiac silhouette. There is a band of linear thickening in the LEFT upper lobe similar to comparison exam. This corresponds to  a masslike lesion on CT of 12/03/2013. RIGHT lung is relatively clear. Lungs are hyperinflated. IMPRESSION: 1. Masslike thickening in the LEFT upper lobe corresponds to indeterminate mass on CT of 12/03/2013. If no interval workup, recommend repeat contrast CT to compared to CT 12/03/2013. 2. Hyperinflated lungs. Electronically Signed   By: Suzy Bouchard M.D.   On: 07/28/2015 10:31   I have personally reviewed and evaluated these images and lab results as part of my medical decision-making.   EKG Interpretation   Date/Time:  Wednesday July 28 2015 09:47:41 EST Ventricular Rate:  80 PR Interval:  136 QRS Duration: 88 QT Interval:  383 QTC Calculation: 442 R Axis:   13 Text Interpretation:  Sinus rhythm Probable left atrial enlargement Abnrm  T, consider ischemia, anterolateral lds , changes in v1 and v2 compared to  prior tracing Confirmed by KNAPP  MD-J, JON ([id number]) on 07/28/2015 9:52:27  AM      MDM   Final diagnoses:  COPD exacerbation ([place])  Patient presents for worsening shortness of breath over the past week and low grade fever x 2-3 days.  Patient was seen by her primary care physician 2 days ago and diagnosed with nocturnal hypoxia. She was prescribed 20 mg of prednisone, given an albuterol nebulizer solution, and started on Levaquin. She had a DuoNeb and Solu-Medrol in office.  She was given continuous neb treatment in ED and still has low oxygen saturation. She has an extensive respiratory history including lung cancer, lobectomy, COPD, and several bouts of pneumonia. Her labs were not concerning. X-ray shows that she does not have pneumonia but does have a left upper lobe masslike thickening that was compared to a previous CT on 12/03/2013. The radiologist recommended a repeat CT with contrast to compared to previous but patient states she had multiple CT scans of her chest this year at [facility]. I was able to view these scan results in care everywhere with her latest CT  being done in July of this year. She states she also had one done in August. I do not believe she needs repeat CT chest unless it is determined once  she is admitted. I believe this is her COPD exacerbation but has failed outpatient treatment. She is well-appearing and will need telemetry placement. I spoke to the nurse practitioner who was working with Dr. Cruzita Lederer to admit the patient.  Donna Glazier, PA-C 07/29/15 1218  Donna Rank, MD 07/30/15 [phone]

## 2015-07-28 NOTE — Patient Instructions (Signed)
Go directly to ER- keep oxygen on 2L  We have given Duoneb at 9:00AM Pt on Prednisone for past 48 hours- no improvement

## 2015-07-28 NOTE — ED Notes (Signed)
Pt reports being sent from the MDs office for sats at 88% ra. Pt reports having an episode of this in the past and she was diagnosed with acute respiratory failure in September. Pt reports hx of lung cancer. SOB is worse with exertion.

## 2015-07-28 NOTE — ED Notes (Signed)
Attempted to call report to [facility]. Charge RN Everlene Other states pt is not appropriate for stepdown. States case manager has reviewed chart and agrees. Will notify admitting team.

## 2015-07-29 DIAGNOSIS — F411 Generalized anxiety disorder: Secondary | ICD-10-CM

## 2015-07-29 DIAGNOSIS — I5032 Chronic diastolic (congestive) heart failure: Secondary | ICD-10-CM

## 2015-07-29 DIAGNOSIS — J9601 Acute respiratory failure with hypoxia: Principal | ICD-10-CM

## 2015-07-29 DIAGNOSIS — Z720 Tobacco use: Secondary | ICD-10-CM

## 2015-07-29 DIAGNOSIS — J441 Chronic obstructive pulmonary disease with (acute) exacerbation: Secondary | ICD-10-CM

## 2015-07-29 LAB — CBC
HCT: 41.8 % (ref 36.0–46.0)
Hemoglobin: 14.2 g/dL (ref 12.0–15.0)
MCH: 34.4 pg — ABNORMAL HIGH (ref 26.0–34.0)
MCHC: 34 g/dL (ref 30.0–36.0)
MCV: 101.2 fL — ABNORMAL HIGH (ref 78.0–100.0)
Platelets: 239 10*3/uL (ref 150–400)
RBC: 4.13 MIL/uL (ref 3.87–5.11)
RDW: 14 % (ref 11.5–15.5)
WBC: 10.8 10*3/uL — ABNORMAL HIGH (ref 4.0–10.5)

## 2015-07-29 LAB — BASIC METABOLIC PANEL
Anion gap: 8 (ref 5–15)
BUN: 16 mg/dL (ref 6–20)
CO2: 28 mmol/L (ref 22–32)
Calcium: 9.3 mg/dL (ref 8.9–10.3)
Chloride: 101 mmol/L (ref 101–111)
Creatinine, Ser: 0.56 mg/dL (ref 0.44–1.00)
GFR calc Af Amer: >60 mL/min (ref >60)
GFR calc non Af Amer: >60 mL/min (ref >60)
Glucose, Bld: 152 mg/dL — ABNORMAL HIGH (ref 65–99)
Potassium: 4.3 mmol/L (ref 3.5–5.1)
Sodium: 137 mmol/L (ref 135–145)

## 2015-07-29 LAB — BRAIN NATRIURETIC PEPTIDE: B Natriuretic Peptide: 40.3 pg/mL (ref 0.0–100.0)

## 2015-07-29 MED ORDER — METHYLPREDNISOLONE SODIUM SUCC 125 MG IJ SOLR
60.0000 mg | Freq: Two times a day (BID) | INTRAMUSCULAR | Status: DC
  Administered 2015-07-29 – 2015-07-30 (×2): 60 mg via INTRAVENOUS
  Filled 2015-07-29 (×2): qty 2

## 2015-07-29 MED ORDER — BOOST / RESOURCE BREEZE PO LIQD: 1.0000 | Freq: Three times a day (TID) | ORAL | Status: DC

## 2015-07-29 MED ORDER — ENOXAPARIN SODIUM 40 MG/0.4ML ~~LOC~~ SOLN
40.0000 mg | SUBCUTANEOUS | Status: DC
  Administered 2015-07-29 – 2015-07-30 (×2): 40 mg via SUBCUTANEOUS
  Filled 2015-07-29 (×2): qty 0.4

## 2015-07-29 MED ORDER — LEVALBUTEROL HCL 0.63 MG/3ML IN NEBU
0.6300 mg | INHALATION_SOLUTION | Freq: Four times a day (QID) | RESPIRATORY_TRACT | Status: DC
  Administered 2015-07-29 – 2015-07-31 (×9): 0.63 mg via RESPIRATORY_TRACT
  Filled 2015-07-29 (×9): qty 3

## 2015-07-29 MED ORDER — LEVALBUTEROL HCL 0.63 MG/3ML IN NEBU: 0.6300 mg | INHALATION_SOLUTION | RESPIRATORY_TRACT | Status: DC | PRN

## 2015-07-29 MED ORDER — IPRATROPIUM BROMIDE 0.02 % IN SOLN
0.5000 mg | Freq: Four times a day (QID) | RESPIRATORY_TRACT | Status: DC
  Administered 2015-07-29 – 2015-07-31 (×9): 0.5 mg via RESPIRATORY_TRACT
  Filled 2015-07-29 (×9): qty 2.5

## 2015-07-29 NOTE — Evaluation (Signed)
Physical Therapy Evaluation Patient Details Name: [name] MRN: [id number] DOB: 06/07/1941 Today's Date: 07/29/2015   History of Present Illness  Pt adm with Acute respiratory failure with hypoxia related to COPD exacerbation. PMH - COPD, prior lung cancer s/p lobectomy  Clinical Impression  Pt admitted with above diagnosis and presents to PT with functional limitations due to deficits listed below (See PT problem list). Pt needs skilled PT to maximize independence and safety to allow discharge to home. Recommend OPPT to help strengthen and to improve her functional activity tolerance.     Follow Up Recommendations Outpatient PT    Equipment Recommendations  None recommended by PT    Recommendations for Other Services       Precautions / Restrictions Precautions Precautions: None      Mobility  Bed Mobility Overal bed mobility: Modified Independent                Transfers Overall transfer level: Modified independent                  Ambulation/Gait Ambulation/Gait assistance: Min guard Ambulation Distance (Feet): 350 Feet Assistive device: None (pushing portable O2 tank) Gait Pattern/deviations: Step-to pattern;Decreased step length - right;Decreased step length - left;Drifts right/left   Gait velocity interpretation: Below normal speed for age/gender General Gait Details: Pt on 3L of O2. Dyspnea 4/4 with amb requiring several standing rest breaks. After resting removed O2 and amb and additional 125'. SpO2 98-99% while amb on O2 and 95-96% while amb on RA.  Stairs            Wheelchair Mobility    Modified Rankin (Stroke Patients Only)       Balance Overall balance assessment: Needs assistance Sitting-balance support: No upper extremity supported;Feet supported Sitting balance-Leahy Scale: Normal     Standing balance support: No upper extremity supported Standing balance-Leahy Scale: Good                                Pertinent Vitals/Pain Pain Assessment: No/denies pain    Home Living Family/patient expects to be discharged to:: Private residence Living Arrangements: Spouse/significant other             Home Equipment: None      Prior Function Level of Independence: Independent         Comments: Increasing difficulty with activities due to dyspnea and lack of energy.     Hand Dominance        Extremity/Trunk Assessment   Upper Extremity Assessment: Generalized weakness           Lower Extremity Assessment: Generalized weakness         Communication   Communication: No difficulties  Cognition Arousal/Alertness: Awake/alert Behavior During Therapy: WFL for tasks assessed/performed Overall Cognitive Status: Within Functional Limits for tasks assessed                      General Comments      Exercises        Assessment/Plan    PT Assessment Patient needs continued PT services  PT Diagnosis Difficulty walking;Generalized weakness   PT Problem List Decreased strength;Decreased activity tolerance;Decreased balance;Decreased mobility;Cardiopulmonary status limiting activity  PT Treatment Interventions DME instruction;Gait training;Functional mobility training;Therapeutic activities;Therapeutic exercise;Balance training;Patient/family education   PT Goals (Current goals can be found in the Care Plan section) Acute Rehab PT Goals Patient Stated Goal: Pt wants to be able  to do more at home. PT Goal Formulation: With patient Time For Goal Achievement: 08/05/15 Potential to Achieve Goals: Good    Frequency Min 3X/week   Barriers to discharge        Co-evaluation               End of Session Equipment Utilized During Treatment: Oxygen Activity Tolerance: Patient limited by fatigue Patient left: in bed;with call bell/phone within reach;with family/visitor present Nurse Communication: Mobility status         Time: 1020-1049 PT Time  Calculation (min) (ACUTE ONLY): 29 min   Charges:   PT Evaluation $Initial PT Evaluation Tier I: 1 Procedure PT Treatments $Gait Training: 8-22 mins   PT G Codes:        Alishia Lebo 08/12/15, 11:25 AM Suanne Marker PT [phone]

## 2015-07-29 NOTE — Progress Notes (Signed)
PROGRESS NOTE  [name] EYC:[id number] DOB: [birth year] DOA: 07/28/2015 PCP: Odette Fraction, MD  HPI/Recap of past 68 hours: 74 year old female with past mental history of lung cancer status post resection, COPD and polycythemia who still occasionally smokes admitted on 11/16 for COPD exacerbation which failed outpatient therapy of by mouth Levaquin and prednisone after symptoms for several days. Patient started on IV Solu-Medrol, nebulizers, oxygen and antibiotics.  Today, patient feels for anxious, although her breathing she states is better than yesterday. Still complaining of shortness of breath, wheezing and cough  Assessment/Plan: Principal Problem:   Acute respiratory failure with hypoxia (HCC) secondary to COPD exacerbation: Improving. Titrate down steroids. Change albuterol to Xopenex given tachycardia and anxiety. Still requiring oxygen. Once breathing is better stabilized, will assess about long-term oxygenation needs. BNP within normal limits Active Problems:   Lung cancer [facility]): Status post lung resection   Hypothyroidism: On Synthroid, TSH normal  Chronic diastolic heart failure: Likely from cor pulmonale. No evidence of acute volume overload     Polycythemia: Following hemoglobin   Tobacco abuse: Patient counseled that even mild tobacco use given her poor lung capacity can easily set off her COPD   Anxiety state: Continue benzos, should improve by changing albuterol to Xopenex   Code Status: DO NOT RESUSCITATE  Family Communication: Husband at the bedside  Disposition Plan: Anticipate discharge in next few days   Consultants:  Have consulted palliative care for goals of care as patient has stated that her quality of life in the past year has gone downhill  Procedures:  None  Antibiotics:  Levaquin 11/14-present   Objective: BP 136/72 mmHg  Pulse 86  Temp(Src) 97.6 F (36.4 C) (Oral)  Resp 20  Ht '5\' 7"'$  (1.702 m)  Wt 72.3 kg (159 lb 6.3  oz)  BMI 24.96 kg/m2  SpO2 97%  Intake/Output Summary (Last 24 hours) at 07/29/15 1647 Last data filed at 07/29/15 1144  Gross per 24 hour  Intake    540 ml  Output    600 ml  Net    -60 ml   Filed Weights   07/28/15 1408  Weight: 72.3 kg (159 lb 6.3 oz)    Exam:   General:  Alert and oriented 3, anxious  Cardiovascular: Regular rhythm, borderline tachycardia  Respiratory: Decreased breath sounds throughout, bilateral end expiratory wheezing  Abdomen: Soft, nontender, nondistended, positive bowel sounds Musculoskeletal: No clubbing or cyanosis or edema  Data Reviewed: Basic Metabolic Panel:  Recent Labs Lab 07/28/15 0957 07/29/15 0614  NA 137 137  K 4.6 4.3  CL 103 101  CO2 23 28  GLUCOSE 129* 152*  BUN 11 16  CREATININE 0.68 0.56  CALCIUM 9.3 9.3   Liver Function Tests: No results for input(s): AST, ALT, ALKPHOS, BILITOT, PROT, ALBUMIN in the last 168 hours. No results for input(s): LIPASE, AMYLASE in the last 168 hours. No results for input(s): AMMONIA in the last 168 hours. CBC:  Recent Labs Lab 07/28/15 0957 07/29/15 0614  WBC 9.7 10.8*  NEUTROABS 7.6  --   HGB 15.8* 14.2  HCT 45.7 41.8  MCV 101.3* 101.2*  PLT 216 239   Cardiac Enzymes:   No results for input(s): CKTOTAL, CKMB, CKMBINDEX, TROPONINI in the last 168 hours. BNP (last 3 results)  Recent Labs  07/29/15 1400  BNP 40.3    ProBNP (last 3 results) No results for input(s): PROBNP in the last 8760 hours.  CBG: No results for input(s): GLUCAP in the last 168  hours.  No results found for this or any previous visit (from the past 240 hour(s)).   Studies: No results found.  Scheduled Meds: . aspirin EC  81 mg Oral QODAY  . budesonide (PULMICORT) nebulizer solution  0.25 mg Nebulization BID  . buPROPion  150 mg Oral Daily  . celecoxib  200 mg Oral Daily  . DULoxetine  60 mg Oral Daily  . enoxaparin (LOVENOX) injection  40 mg Subcutaneous Q24H  . erythromycin  1 application  Right Eye BID  . feeding supplement  1 Container Oral TID BM  . fluticasone  2 spray Each Nare Daily  . lisinopril  10 mg Oral Daily   And  . hydrochlorothiazide  12.5 mg Oral Daily  . ipratropium  0.5 mg Nebulization Q6H  . levalbuterol  0.63 mg Nebulization Q6H  . levofloxacin  500 mg Oral Daily  . levothyroxine  100 mcg Oral q morning - 10a  . loratadine  10 mg Oral Daily  . methylPREDNISolone (SOLU-MEDROL) injection  60 mg Intravenous Q12H  . multivitamin  1 tablet Oral QHS  . simvastatin  40 mg Oral QHS  . sodium chloride  3 mL Intravenous Q12H    Continuous Infusions:    Time spent: 25 minutes  [facility] Pager [phone]. If 7PM-7AM, please contact night-coverage at [email or web address], password [facility] 07/29/2015, 4:47 PM  LOS: 1 day

## 2015-07-29 NOTE — Progress Notes (Signed)
Initial Nutrition Assessment  DOCUMENTATION CODES:   Not applicable  INTERVENTION:  Boost Breeze po TID, each supplement provides 250 kcal and 9 grams of protein  NUTRITION DIAGNOSIS:   Inadequate oral intake related to inability to eat as evidenced by other (see comment), per patient/family report (SOB).  GOAL:   Patient will meet greater than or equal to 90% of their needs  MONITOR:   PO intake, I & O's, Labs, Supplement acceptance  REASON FOR ASSESSMENT:   Malnutrition Screening Tool    ASSESSMENT:   [name] is a very pleasant 74 year old female with a past medical history that includes lung cancer with right middle lobe lobectomy several years ago, COPD, hypertension, presents to the emergency department with the chief complaint of gradual worsening shortness of breath and cough. Initial evaluation in the emergency department reveals acute respiratory failure with hypoxia  Spoke with pt at bedside. Admits to poor PO intake, related to shortness of breath, stated she "couldn't breath" and therefore couldn't eat.  Reports that she normally eats quite healthy, and has a great appetite, but when she has these ARF episodes, has trouble with po intake.  Nutrition-Focused physical exam completed. Findings are no fat depletion, no muscle depletion, and no edema.   Pt reported no wt loss, n/v, or chewing/swallowing issues. Labs and medications reviewed.   Diet Order:  Diet Heart Room service appropriate?: Yes; Fluid consistency:: Thin  Skin:  Reviewed, no issues  Last BM:  07/27/2015  Height:   Ht Readings from Last 1 Encounters:  07/28/15 '5\' 7"'$  (1.702 m)    Weight:   Wt Readings from Last 1 Encounters:  07/28/15 159 lb 6.3 oz (72.3 kg)    Ideal Body Weight:  61.36 kg  BMI:  Body mass index is 24.96 kg/(m^2).  Estimated Nutritional Needs:   Kcal:  1450-1800 calories  Protein:  70-85 grams  Fluid:  >/= 1.5L  EDUCATION NEEDS:   No education  needs identified at this time  Satira Anis. Yasseen Salls, MS, RD LDN After Hours/Weekend Pager [phone]

## 2015-07-30 DIAGNOSIS — Z515 Encounter for palliative care: Secondary | ICD-10-CM | POA: Insufficient documentation

## 2015-07-30 MED ORDER — METHYLPREDNISOLONE SODIUM SUCC 40 MG IJ SOLR
40.0000 mg | Freq: Two times a day (BID) | INTRAMUSCULAR | Status: DC
  Administered 2015-07-30 – 2015-07-31 (×2): 40 mg via INTRAVENOUS
  Filled 2015-07-30 (×2): qty 1

## 2015-07-30 NOTE — Consult Note (Signed)
Consultation Note Date: 07/30/2015   Patient Name: [name]  DOB: 02/24/1941  MRN: [id number]  Age / Sex: 74 y.o., female  PCP: Susy Frizzle, MD Referring Physician: Annita Brod, MD  Reason for Consultation: Establishing goals of care and Non pain symptom management    Clinical Assessment/Narrative: Pt is a 74 yo female with a h/o lung cancer with resection, COPD, dCHF, hypothyroidism, polycythemia, anxiety adm secondary to COPD exacerbation. Pt failed out-patient therapy with Levaquin, steroids. She has been started on solumedrol, nebs and iv antibiotics, and continuous 02. She verbalizes that she feels better but is expressing concern over her declining quality of life. She now is requiring increased 02, has  decreased ADL function, as well as worsening anxiety. She has had a prior hospitalization with similar presentation once in the last 6 months. 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 She reported that her "breathing as been so bad she didn't feel as though she could live like this".   Contacts/Participants in Discussion:Pt Primary Decision Maker: Pt at this point HCPOA: yes  Husband. Pt also has 3 children  SUMMARY OF RECOMMENDATIONS DNR/DNI Continue supportive therapy in terms of antibiotics, nebs, as well as 02 Discussed role of low dose opioids to manage acute dyspnea Discussed in-home hospice as a possibility given pt's goal of improved comfort, not to return to hospital if possible in setting of worsening functional status of COPD and lung cancer and polycythemia, dCHF   Code Status/Advance Care Planning: DNR    Code Status Orders        Start     Ordered   07/28/15 1501  Do not attempt resuscitation (DNR)   Continuous    Question Answer Comment  In the event of cardiac or respiratory ARREST Do not call a "code blue"   In the event of cardiac or respiratory ARREST Do  not perform Intubation, CPR, defibrillation or ACLS   In the event of cardiac or respiratory ARREST Use medication by any route, position, wound care, and other measures to relive pain and suffering. May use oxygen, suction and manual treatment of airway obstruction as needed for comfort.      07/28/15 1500    Advance Directive Documentation        Most Recent Value   Type of Advance Directive  Healthcare Power of Attorney, Living will   Pre-existing out of facility DNR order (yellow form or pink MOST form)     "MOST" Form in Place?          Symptom Management Dyspnea: Discussed the role of opioids to mange acute dyspnea in addition to targeted pulmonary treatment. Pt wishes to discuss with son who is an EMS, but would recommend trial of morphine concentrate 2.5-'5mg'$  q4 prn. Will revisit tomorrow to see if while she is in hospital setting if she would like to try a dose  Psycho-social/Spiritual:  Support System: Strong Desire for further Chaplaincy support:no Additional Recommendations: Education on Hospice  Prognosis: Less than 6 months. Pt likely a candidate for in-home hospice given high risk for acute cardiopulmonary failure in setting of advanced COPD, lung cancer and dCHF.  Discharge Planning:  Home with Hospice as possibility. Going to discuss with family   Chief Complaint/ Primary Diagnoses: Present on Admission:  . COPD exacerbation ([place]) . Lung cancer ([place]) . Hypothyroidism . Acute respiratory failure with hypoxia ([place]) . Polycythemia . Tobacco abuse . Anxiety state  I have reviewed the medical record, interviewed  the patient and family, and examined the patient. The following aspects are pertinent.  Past Medical History  Diagnosis Date  . Allergic rhinitis   . Anemia   . Diverticula of colon   . Gastric ulcer with hemorrhage   . Esophagitis   . Arrhythmia   . Aseptic necrosis ([place])   . Osteoporosis   . Hyperlipidemia   . Anxiety   . Depression   . COPD  (chronic obstructive pulmonary disease) ([place])   . GERD (gastroesophageal reflux disease)   . Hypertension   . Kidney stones   . Heart murmur   . Mitral valve prolapse     "no ORs" (07/28/2015)  . Asthma dxc'd 05/2015  . Pneumonia 1970's; 06/2005  . Sleep apnea dx'd 06/2015    "haven't had followup appt yet" 07/28/2015)  . Hypothyroidism   . History of blood transfusion 06/2005    "related to pneumonia"  . Bleeding stomach ulcer ~ 06/2005  . Arthritis     "hands, neck, right shoulder" (07/28/2015)  . Basal cell cancer   . Lung cancer ([place])   . Squamous cell skin cancer    Social History   Social History  . Marital Status: Married    Spouse Name: N/A  . Number of Children: 3  . Years of Education: N/A   Occupational History  . retired Marine scientist    Social History Main Topics  . Smoking status: Current Some Day Smoker -- 0.00 packs/day for 43 years    Types: Cigarettes  . Smokeless tobacco: Never Used  . Alcohol Use: 4.2 oz/week    7 Shots of liquor per week  . Drug Use: No  . Sexual Activity: Yes   Other Topics Concern  . None   Social History Narrative   Family History  Problem Relation Age of Onset  . Graves' disease Daughter   . Breast cancer Maternal Aunt   . Heart disease Maternal Aunt   . Hypertension Mother   . Arthritis Maternal Grandmother    Scheduled Meds: . aspirin EC  81 mg Oral QODAY  . budesonide (PULMICORT) nebulizer solution  0.25 mg Nebulization BID  . buPROPion  150 mg Oral Daily  . celecoxib  200 mg Oral Daily  . DULoxetine  60 mg Oral Daily  . enoxaparin (LOVENOX) injection  40 mg Subcutaneous Q24H  . erythromycin  1 application Right Eye BID  . feeding supplement  1 Container Oral TID BM  . fluticasone  2 spray Each Nare Daily  . lisinopril  10 mg Oral Daily   And  . hydrochlorothiazide  12.5 mg Oral Daily  . ipratropium  0.5 mg Nebulization Q6H  . levalbuterol  0.63 mg Nebulization Q6H  . levofloxacin  500 mg Oral Daily  .  levothyroxine  100 mcg Oral q morning - 10a  . loratadine  10 mg Oral Daily  . methylPREDNISolone (SOLU-MEDROL) injection  40 mg Intravenous Q12H  . multivitamin  1 tablet Oral QHS  . simvastatin  40 mg Oral QHS  . sodium chloride  3 mL Intravenous Q12H   Continuous Infusions:  PRN Meds:.sodium chloride, acetaminophen **OR** acetaminophen, ALPRAZolam, alum & mag hydroxide-simeth, bisacodyl, guaiFENesin-dextromethorphan, levalbuterol, ondansetron **OR** ondansetron (ZOFRAN) IV, oxyCODONE-acetaminophen, sodium chloride, traZODone Medications Prior to Admission:  Prior to Admission medications   Medication Sig Start Date End Date Taking? Authorizing Provider  albuterol (PROVENTIL) (2.5 MG/3ML) 0.083% nebulizer solution Take 3 mLs (2.5 mg total) by nebulization every 4 (four) hours as needed for wheezing  or shortness of breath. 07/26/15  Yes Alycia Rossetti, MD  ALPRAZolam Duanne Moron) 0.5 MG tablet TAKE 1 TABLET BY MOUTH AT BEDTIME AS NEEDED 05/31/15  Yes Susy Frizzle, MD  aspirin 81 MG tablet Take 81 mg by mouth every other day.    Yes Historical Provider, MD  b complex-C-folic acid 1 MG capsule Take 1 capsule by mouth daily.     Yes Historical Provider, MD  buPROPion (WELLBUTRIN XL) 150 MG 24 hr tablet TAKE 1 TABLET DAILY 11/09/14  Yes Susy Frizzle, MD  celecoxib (CELEBREX) 200 MG capsule Take 1 capsule (200 mg total) by mouth daily. 12/16/14  Yes Susy Frizzle, MD  DULoxetine (CYMBALTA) 60 MG capsule TAKE 1 CAPSULE DAILY 12/07/14  Yes Susy Frizzle, MD  erythromycin ophthalmic ointment Place 1 application into the right eye 2 (two) times daily. 05/25/15  Yes Susy Frizzle, MD  fluticasone (FLONASE) 50 MCG/ACT nasal spray Place 2 sprays into both nostrils daily. 07/24/14  Yes Susy Frizzle, MD  Fluticasone-Salmeterol (ADVAIR DISKUS) 250-50 MCG/DOSE AEPB Inhale 1 puff into the lungs 2 (two) times daily. 06/21/15  Yes Susy Frizzle, MD  levofloxacin (LEVAQUIN) 500 MG tablet Take 1  tablet (500 mg total) by mouth daily. 07/26/15  Yes Alycia Rossetti, MD  levothyroxine (SYNTHROID, LEVOTHROID) 100 MCG tablet TAKE 1 TABLET EVERY MORNING 04/26/15  Yes Susy Frizzle, MD  lisinopril-hydrochlorothiazide (PRINZIDE,ZESTORETIC) 10-12.5 MG per tablet TAKE 1 TABLET DAILY 05/31/15  Yes Susy Frizzle, MD  loratadine (CLARITIN) 10 MG tablet TAKE 1 TABLET DAILY   Yes Collene Gobble, MD  mometasone (ELOCON) 0.1 % ointment Apply topically daily. 05/29/14  Yes Susy Frizzle, MD  oxyCODONE-acetaminophen (PERCOCET/ROXICET) 5-325 MG per tablet Take 1-2 tablets by mouth every 6 (six) hours as needed for severe pain. 12/03/13  Yes Fredia Sorrow, MD  predniSONE (DELTASONE) 20 MG tablet Take 3 daily for 2 days, then 2 daily for 2 days, then 1 daily for 2 days. 07/26/15  Yes Alycia Rossetti, MD  simvastatin (ZOCOR) 40 MG tablet Take 1 tablet (40 mg total) by mouth at bedtime. 04/05/15  Yes Susy Frizzle, MD  traZODone (DESYREL) 50 MG tablet Take 50 mg by mouth at bedtime as needed for sleep.    Yes Historical Provider, MD  VITAMIN D, CHOLECALCIFEROL, PO Take 1 tablet by mouth daily.   Yes Historical Provider, MD   Allergies  Allergen Reactions  . Doxycycline Nausea Only  . Erythromycin Nausea Only  . Sulfonamide Derivatives Hives    Review of Systems  Constitutional: Positive for diaphoresis, activity change and fatigue.  HENT: Positive for hearing loss.   Eyes: Negative.   Respiratory: Positive for cough and shortness of breath.   Endocrine: Positive for heat intolerance.  Genitourinary: Negative.   Musculoskeletal: Positive for back pain.  Neurological: Positive for weakness.  Psychiatric/Behavioral:       Anxious    Physical Exam  Constitutional: She is oriented to person, place, and time. She appears well-developed.  HENT:  Head: Normocephalic.  Cardiovascular: Normal rate and regular rhythm.   Respiratory:  Mild work of breathing at rest. Wearing 02  Musculoskeletal:  Normal range of motion.  Neurological: She is alert and oriented to person, place, and time.  Skin: Skin is warm and dry.  Psychiatric: She has a normal mood and affect.    Vital Signs: BP 132/72 mmHg  Pulse 68  Temp(Src) 97.7 F (36.5 C) (Oral)  Resp 20  Ht '5\' 7"'$  (1.702 m)  Wt 72.3 kg (159 lb 6.3 oz)  BMI 24.96 kg/m2  SpO2 95%  SpO2: SpO2: 95 % O2 Device:SpO2: 95 % O2 Flow Rate: .O2 Flow Rate (L/min): 3 L/min  IO: Intake/output summary:  Intake/Output Summary (Last 24 hours) at 07/30/15 1744 Last data filed at 07/30/15 1306  Gross per 24 hour  Intake    240 ml  Output   1300 ml  Net  -1060 ml    LBM: Last BM Date: 07/27/15 Baseline Weight: Weight: 72.3 kg (159 lb 6.3 oz) Most recent weight: Weight: 72.3 kg (159 lb 6.3 oz)      Palliative Assessment/Data:  Flowsheet Rows        Most Recent Value   Intake Tab    Referral Department  Hospitalist   Unit at Time of Referral  Med/Surg Unit   Palliative Care Primary Diagnosis  Pulmonary   Date Notified  07/29/15   Palliative Care Type  New Palliative care   Reason for referral  Non-pain Symptom, Clarify Goals of Care, Counsel Regarding Hospice   Date of Admission  07/28/15   Date first seen by Palliative Care  07/30/15   # of days Palliative referral response time  1 Day(s)   # of days IP prior to Palliative referral  1   Clinical Assessment    Palliative Performance Scale Score  60%   Pain Max last 24 hours  2   Pain Min Last 24 hours  2   Dyspnea Max Last 24 Hours  7   Dyspnea Min Last 24 hours  5   Nausea Max Last 24 Hours  0   Anxiety Max Last 24 Hours  8   Anxiety Min Last 24 Hours  3   Other Max Last 24 Hours  Not able to report   Psychosocial & Spiritual Assessment    Social Work Plan of Care  Education on [place]    Patient/Family meeting held?  Yes   Palliative Care Outcomes  Counseled regarding hospice   Patient/Family wishes: Interventions discontinued/not started    Antibiotics   Palliative Care follow-up planned  Yes, Facility      Additional Data Reviewed:  CBC:    Component Value Date/Time   WBC 10.8* 07/29/2015 0614   WBC 7.9 10/20/2009 0942   HGB 14.2 07/29/2015 0614   HGB 11.4* 10/20/2009 0942   HCT 41.8 07/29/2015 0614   HCT 33.6* 10/20/2009 0942   PLT 239 07/29/2015 0614   PLT 357 10/20/2009 0942   MCV 101.2* 07/29/2015 0614   MCV 96.1 10/20/2009 0942   NEUTROABS 7.6 07/28/2015 0957   NEUTROABS 5.0 10/20/2009 0942   LYMPHSABS 1.2 07/28/2015 0957   LYMPHSABS 1.8 10/20/2009 0942   MONOABS 0.9 07/28/2015 0957   MONOABS 0.9 10/20/2009 0942   EOSABS 0.0 07/28/2015 0957   EOSABS 0.2 10/20/2009 0942   BASOSABS 0.0 07/28/2015 0957   BASOSABS 0.0 10/20/2009 0942   Comprehensive Metabolic Panel:    Component Value Date/Time   NA 137 07/29/2015 0614   K 4.3 07/29/2015 0614   CL 101 07/29/2015 0614   CO2 28 07/29/2015 0614   BUN 16 07/29/2015 0614   CREATININE 0.56 07/29/2015 0614   CREATININE 0.66 01/16/2014 0949   GLUCOSE 152* 07/29/2015 0614   CALCIUM 9.3 07/29/2015 0614   AST 17 01/16/2014 0949   ALT 14 01/16/2014 0949   ALKPHOS 69 01/16/2014 0949   BILITOT 0.3 01/16/2014 0949  PROT 6.4 01/16/2014 0949   ALBUMIN 4.0 01/16/2014 0949     Time In: 1645 Time Out: 1800 Time Total: 75 min Greater than 50%  of this time was spent counseling and coordinating care related to the above assessment and plan.  Signed by: Dory Horn, NP  Dory Horn, NP  07/30/2015, 5:44 PM  Please contact Palliative Medicine Team phone at [phone] for questions and concerns.

## 2015-07-30 NOTE — Progress Notes (Addendum)
Physical Therapy Treatment Patient Details Name: [name] MRN: [id number] DOB: 30-Aug-1941 Today's Date: 08-01-2015    History of Present Illness Pt adm with Acute respiratory failure with hypoxia related to COPD exacerbation. PMH - COPD, prior lung cancer s/p lobectomy    PT Comments    Pt with improvement in dyspnea but did show decr SpO2 with longer amb on RA  Follow Up Recommendations  Outpatient PT     Equipment Recommendations  None recommended by PT    Recommendations for Other Services       Precautions / Restrictions Precautions Precautions: None    Mobility  Bed Mobility Overal bed mobility: Modified Independent                Transfers Overall transfer level: Modified independent                  Ambulation/Gait Ambulation/Gait assistance: Supervision Ambulation Distance (Feet): 750 Feet Assistive device: None Gait Pattern/deviations: Step-through pattern;Decreased stride length   Gait velocity interpretation: Below normal speed for age/gender General Gait Details: Slightly unsteady gait but able to regain without overt LOB. Dyspnea 2-3/4 when amb on RA. SpO2 88% on RA with amb and 95% with amb on 3L.   Stairs            Wheelchair Mobility    Modified Rankin (Stroke Patients Only)       Balance Overall balance assessment: Needs assistance Sitting-balance support: No upper extremity supported Sitting balance-Leahy Scale: Normal     Standing balance support: No upper extremity supported Standing balance-Leahy Scale: Good                      Cognition Arousal/Alertness: Awake/alert Behavior During Therapy: WFL for tasks assessed/performed Overall Cognitive Status: Within Functional Limits for tasks assessed                      Exercises      General Comments        Pertinent Vitals/Pain Pain Assessment: No/denies pain    Home Living                      Prior Function             PT Goals (current goals can now be found in the care plan section) Acute Rehab PT Goals PT Goal Formulation: With patient Time For Goal Achievement: 08/05/15 Potential to Achieve Goals: Good Progress towards PT goals: Progressing toward goals    Frequency  Min 3X/week    PT Plan Discharge plan needs to be updated    Co-evaluation             End of Session Equipment Utilized During Treatment: Oxygen Activity Tolerance: Patient limited by fatigue Patient left: in bed;with call bell/phone within reach;with family/visitor present     Time: 1478-2956 PT Time Calculation (min) (ACUTE ONLY): 21 min  Charges:  $Gait Training: 8-22 mins                    G Codes:      [name] 2015/08/01, 2:16 PM [employer] PT [phone]

## 2015-07-30 NOTE — Progress Notes (Signed)
SATURATION QUALIFICATIONS: (This note is used to comply with regulatory documentation for home oxygen)  Patient Saturations on Room Air at Rest = 96%  Patient Saturations on Room Air while Ambulating = 88%  Patient Saturations on 3 Liters of oxygen while Ambulating = 95%  Please briefly explain why patient needs home oxygen: Pt unable to maintain safe level of Oxygen with activity.  [employer] PT [phone]

## 2015-07-30 NOTE — Progress Notes (Signed)
PROGRESS NOTE  [name] TOI:[id number] DOB: 04-Sep-1941 DOA: 07/28/2015 PCP: Odette Fraction, MD  HPI/Recap of past 57 hours: 74 year old female with past mental history of lung cancer status post resection, COPD and polycythemia who still occasionally smokes admitted on 11/16 for COPD exacerbation which failed outpatient therapy of by mouth Levaquin and prednisone after symptoms for several days. Patient started on IV Solu-Medrol, nebulizers, oxygen and antibiotics.  Patient continues to improve. Ambulate with PT, and oxygen saturations fell to 88% on room air, although she did not feel as dyspneic. Feels like cough is more productive because things are starting to break up. Less wheezing.  Assessment/Plan: Principal Problem:   Acute respiratory failure with hypoxia (HCC) secondary to COPD exacerbation: Continues to improve Titrate down steroids. Continue nebulizers. Still requiring oxygen. Anticipate she will need home oxygen. BNP within normal limits Active Problems:   Lung cancer [facility]): Status post lung resection   Hypothyroidism: On Synthroid, TSH normal  Chronic diastolic heart failure: Likely from cor pulmonale. No evidence of acute volume overload     Polycythemia: Following hemoglobin   Tobacco abuse: Patient counseled that even mild tobacco use given her poor lung capacity can easily set off her COPD   Anxiety state: Continue benzos, also improved by changing albuterol to Xopenex   Code Status: DO NOT RESUSCITATE  Family Communication: Husband at the bedside  Disposition Plan: Anticipate discharge in next few days   Consultants:  Have consulted palliative care for goals of care as patient has stated that her quality of life in the past year has gone downhill  Procedures:  None  Antibiotics:  Levaquin 11/14-present   Objective: BP 132/72 mmHg  Pulse 68  Temp(Src) 97.7 F (36.5 C) (Oral)  Resp 20  Ht '5\' 7"'$  (1.702 m)  Wt 72.3 kg (159 lb 6.3 oz)   BMI 24.96 kg/m2  SpO2 95%  Intake/Output Summary (Last 24 hours) at 07/30/15 1706 Last data filed at 07/30/15 1306  Gross per 24 hour  Intake    240 ml  Output   1300 ml  Net  -1060 ml   Filed Weights   07/28/15 1408  Weight: 72.3 kg (159 lb 6.3 oz)    Exam:   General:  Alert and oriented 3, no acute distress  Cardiovascular: Regular rhythm, borderline tachycardia  Respiratory: Decreased breath sounds throughout, some end expiratory wheezing, although improved from previous  Abdomen: Soft, nontender, nondistended, positive bowel sounds Musculoskeletal: No clubbing or cyanosis or edema  Data Reviewed: Basic Metabolic Panel:  Recent Labs Lab 07/28/15 0957 07/29/15 0614  NA 137 137  K 4.6 4.3  CL 103 101  CO2 23 28  GLUCOSE 129* 152*  BUN 11 16  CREATININE 0.68 0.56  CALCIUM 9.3 9.3   Liver Function Tests: No results for input(s): AST, ALT, ALKPHOS, BILITOT, PROT, ALBUMIN in the last 168 hours. No results for input(s): LIPASE, AMYLASE in the last 168 hours. No results for input(s): AMMONIA in the last 168 hours. CBC:  Recent Labs Lab 07/28/15 0957 07/29/15 0614  WBC 9.7 10.8*  NEUTROABS 7.6  --   HGB 15.8* 14.2  HCT 45.7 41.8  MCV 101.3* 101.2*  PLT 216 239   Cardiac Enzymes:   No results for input(s): CKTOTAL, CKMB, CKMBINDEX, TROPONINI in the last 168 hours. BNP (last 3 results)  Recent Labs  07/29/15 1400  BNP 40.3    ProBNP (last 3 results) No results for input(s): PROBNP in the last 8760 hours.  CBG:  No results for input(s): GLUCAP in the last 168 hours.  No results found for this or any previous visit (from the past 240 hour(s)).   Studies: No results found.  Scheduled Meds: . aspirin EC  81 mg Oral QODAY  . budesonide (PULMICORT) nebulizer solution  0.25 mg Nebulization BID  . buPROPion  150 mg Oral Daily  . celecoxib  200 mg Oral Daily  . DULoxetine  60 mg Oral Daily  . enoxaparin (LOVENOX) injection  40 mg Subcutaneous Q24H   . erythromycin  1 application Right Eye BID  . feeding supplement  1 Container Oral TID BM  . fluticasone  2 spray Each Nare Daily  . lisinopril  10 mg Oral Daily   And  . hydrochlorothiazide  12.5 mg Oral Daily  . ipratropium  0.5 mg Nebulization Q6H  . levalbuterol  0.63 mg Nebulization Q6H  . levofloxacin  500 mg Oral Daily  . levothyroxine  100 mcg Oral q morning - 10a  . loratadine  10 mg Oral Daily  . methylPREDNISolone (SOLU-MEDROL) injection  40 mg Intravenous Q12H  . multivitamin  1 tablet Oral QHS  . simvastatin  40 mg Oral QHS  . sodium chloride  3 mL Intravenous Q12H    Continuous Infusions:    Time spent: 15 minutes  [facility] Pager [phone]. If 7PM-7AM, please contact night-coverage at [email or web address], password [facility] 07/30/2015, 5:06 PM  LOS: 2 days

## 2015-07-30 NOTE — Care Management Note (Signed)
Case Management Note  Patient Details  Name: [name] MRN: [id number] Date of Birth: Apr 17, 1941  Subjective/Objective:                  Date-07-29-18 Initial Assessment Spoke with patient at the bedside along with spouse Introduced self as case Freight forwarder and explained role in discharge planning and how to be reached.  Verified patient lives at home with spouse Verified patient anticipates to go home with spouse, at time of discharge Patient has DME cane walker nebulizer. Expressed potential need for oxygen. Patient had qualifying sat of 88% and insured through Medicare with chronic illness of COPD. Referral made to [facility]. Dr Lyman Speller paged for order to be placed.  Patient denied  needing help with their medication.  Patient  is driven by spoudse to MD appointments.  Verified patient has PCP, and follows with [facility] Patient states they currently receive [facility] services through no one. Referral made to [facility] for outpatient PT  Patient with history of COPD, Gold Protocol, admitted with respiratory distress.  Plan: CM will continue to follow for discharge planning and [facility] resources.   Carles Collet RN BSN CM [phone]  Action/Plan:  [facility], referral made, Home O2, order placed through [facility]. Will be delivered to room prior to DC   Expected Discharge Date:                  Expected Discharge Plan:  Home/Self Care  In-House Referral:     Discharge planning Services  CM Consult  Post Acute Care Choice:  Durable Medical Equipment Choice offered to:  Patient  DME Arranged:  Oxygen DME Agency:  [place]:    [facility] Agency:     Status of Service:  Completed, signed off  Medicare Important Message Given:    Date Medicare IM Given:    Medicare IM give by:    Date Additional Medicare IM Given:    Additional Medicare Important Message give by:     If discussed at [place] of Stay Meetings, dates discussed:    Additional Comments:  Carles Collet, RN 07/30/2015, 2:19 PM

## 2015-07-31 DIAGNOSIS — Z515 Encounter for palliative care: Secondary | ICD-10-CM

## 2015-07-31 MED ORDER — GUAIFENESIN-DM 100-10 MG/5ML PO SYRP: 5.0000 mL | ORAL_SOLUTION | ORAL | Status: DC | PRN

## 2015-07-31 MED ORDER — PREDNISONE 10 MG PO TABS: ORAL_TABLET | ORAL | Status: DC

## 2015-07-31 MED ORDER — BOOST / RESOURCE BREEZE PO LIQD: 1.0000 | Freq: Three times a day (TID) | ORAL | Status: DC

## 2015-07-31 NOTE — Consult Note (Signed)
Donna Luby EdD 

## 2015-07-31 NOTE — Progress Notes (Signed)
Nsg Discharge Note  Admit Date:  07/28/2015 Discharge date: 07/31/2015   [name] to be D/C'd Home per MD order.  AVS completed.  Copy for chart, and copy for patient signed, and dated. Patient/caregiver able to verbalize understanding.  Discharge Medication:   Medication List    TAKE these medications        albuterol (2.5 MG/3ML) 0.083% nebulizer solution  Commonly known as:  PROVENTIL  Take 3 mLs (2.5 mg total) by nebulization every 4 (four) hours as needed for wheezing or shortness of breath.     ALPRAZolam 0.5 MG tablet  Commonly known as:  XANAX  TAKE 1 TABLET BY MOUTH AT BEDTIME AS NEEDED     aspirin 81 MG tablet  Take 81 mg by mouth every other day.     b complex-C-folic acid 1 MG capsule  Take 1 capsule by mouth daily.     buPROPion 150 MG 24 hr tablet  Commonly known as:  WELLBUTRIN XL  TAKE 1 TABLET DAILY     celecoxib 200 MG capsule  Commonly known as:  CELEBREX  Take 1 capsule (200 mg total) by mouth daily.     DULoxetine 60 MG capsule  Commonly known as:  CYMBALTA  TAKE 1 CAPSULE DAILY     erythromycin ophthalmic ointment  Place 1 application into the right eye 2 (two) times daily.     feeding supplement Liqd  Take 1 Container by mouth 3 (three) times daily between meals.     fluticasone 50 MCG/ACT nasal spray  Commonly known as:  FLONASE  Place 2 sprays into both nostrils daily.     Fluticasone-Salmeterol 250-50 MCG/DOSE Aepb  Commonly known as:  ADVAIR DISKUS  Inhale 1 puff into the lungs 2 (two) times daily.     guaiFENesin-dextromethorphan 100-10 MG/5ML syrup  Commonly known as:  ROBITUSSIN DM  Take 5 mLs by mouth every 4 (four) hours as needed for cough.     levofloxacin 500 MG tablet  Commonly known as:  LEVAQUIN  Take 1 tablet (500 mg total) by mouth daily.     levothyroxine 100 MCG tablet  Commonly known as:  SYNTHROID, LEVOTHROID  TAKE 1 TABLET EVERY MORNING     lisinopril-hydrochlorothiazide 10-12.5 MG tablet   Commonly known as:  PRINZIDE,ZESTORETIC  TAKE 1 TABLET DAILY     loratadine 10 MG tablet  Commonly known as:  CLARITIN  TAKE 1 TABLET DAILY     mometasone 0.1 % ointment  Commonly known as:  ELOCON  Apply topically daily.     oxyCODONE-acetaminophen 5-325 MG tablet  Commonly known as:  PERCOCET/ROXICET  Take 1-2 tablets by mouth every 6 (six) hours as needed for severe pain.     predniSONE 10 MG tablet  Commonly known as:  DELTASONE  50 mg po daily x 3 days, then 40 mg po daily x 3 days, then 30 mg x 3 days, then '20mg'$  x 3 days, then '10mg'$  x 3 days     simvastatin 40 MG tablet  Commonly known as:  ZOCOR  Take 1 tablet (40 mg total) by mouth at bedtime.     traZODone 50 MG tablet  Commonly known as:  DESYREL  Take 50 mg by mouth at bedtime as needed for sleep.     VITAMIN D (CHOLECALCIFEROL) PO  Take 1 tablet by mouth daily.        Discharge Assessment: Filed Vitals:   07/31/15 1425  BP: 141/77  Pulse: 72  Temp:  98.9 F (37.2 C)  Resp: 20   Skin clean, dry and intact without evidence of skin break down, no evidence of skin tears noted. IV catheter discontinued intact. Site without signs and symptoms of complications - no redness or edema noted at insertion site, patient denies c/o pain - only slight tenderness at site.  Dressing with slight pressure applied.  D/c Instructions-Education: Discharge instructions given to patient/family with verbalized understanding. D/c education completed with patient/family including follow up instructions, medication list, d/c activities limitations if indicated, with other d/c instructions as indicated by MD - patient able to verbalize understanding, all questions fully answered. Patient instructed to return to ED, call 911, or call MD for any changes in condition.  Patient escorted via [place], and D/C home via private auto.  Salley Slaughter, RN 07/31/2015 2:41 PM

## 2015-07-31 NOTE — Discharge Summary (Signed)
Discharge Summary  [name] QIW:[id number] DOB: Oct 10, 1940  PCP: Odette Fraction, MD  Admit date: 07/28/2015 Discharge date: 07/31/2015  Time spent: 25 minutes  Recommendations for Outpatient Follow-up:  1. Patient being discharged home on 2 L nasal cannula continuous oxygen 2. New Medication: Prednisone taper:  3. Patient given referral for hospice care at home 4. New medication: Levaquin 500 mg by mouth times the next 4 days  Discharge Diagnoses:  Active Hospital Problems   Diagnosis Date Noted  . Acute respiratory failure with hypoxia ([place]) 07/28/2015  . Palliative care encounter   . Tobacco abuse 07/29/2015  . Anxiety state 07/29/2015  . COPD exacerbation ([place]) 07/28/2015  . Polycythemia 07/28/2015  . Hypothyroidism 02/15/2013  . Lung cancer [facility]) 08/31/2011    Resolved Hospital Problems   Diagnosis Date Noted Date Resolved  No resolved problems to display.    Discharge Condition: Improved from initial admission, long-term prognosis is limited and patient referred to hospice  Diet recommendation:  Regular diet with boost breeze 3 times a day Filed Weights   07/28/15 1408  Weight: 72.3 kg (159 lb 6.3 oz)    History of present illness:  74 year old female with past mental history of lung cancer status post resection, COPD and polycythemia who still occasionally smokes admitted on 11/16 for COPD exacerbation which failed outpatient therapy of by mouth Levaquin and prednisone after symptoms for several days. Patient started on IV Solu-Medrol, nebulizers, oxygen and antibiotics.  Hospital Course:  Principal Problem:   Acute respiratory failure with hypoxia ([place]) secondary COPD exacerbation: Patient responded well overall. Over the next few days, steroids were titrated down. She was continued on nebulizers. Ambulate by physical therapy on 11/18 evening and oxygen saturations fell to 88% on room air. Plan to discharge patient home on oxygen, prednisone taper.  She will continue home nebulizers. Continue 4 more days of Levaquin.  Overall patient's quality of life has declined significantly in the past year. Discussed with positive medicine who consulted. Patient's overall life expectancy is not felt to exceed 6 months and she was given a referral to hospice. Patient will stay is DO NOT RESUSCITATE. Goals of care were set established Active Problems:   Lung cancer [facility]): Status post lung resection   Hypothyroidism: On Synthroid. TSH checked and found to be normal    Polycythemia   Tobacco abuse: Patient intermittently still smokes. Counseled to quit and advised that even small amounts of smoking at this point can set off COPD exacerbation   Anxiety state: Continued on when necessary Xanax. Slightly improved by changing her albuterol nebulizers to Xopenex   Palliative care encounter Chronic diastolic heart failure: Seen on echocardiogram done in 2010. Likely cor pulmonale  Procedures:  None  Consultations:  Palliative care  Discharge Exam: BP 141/77 mmHg  Pulse 72  Temp(Src) 98.9 F (37.2 C) (Oral)  Resp 20  Ht '5\' 7"'$  (1.702 m)  Wt 72.3 kg (159 lb 6.3 oz)  BMI 24.96 kg/m2  SpO2 97%  General: alert and oriented 3, no acute distress Cardiovascular: regular rate and rhythm, S1-S2 Respiratory:  Decreased breath sounds throughout, mild prolonged expiratory phaseischarge Instructions You were cared for by a hospitalist during your hospital stay. If you have any questions about your discharge medications or the care you received while you were in the hospital after you are discharged, you can call the unit and asked to speak with the hospitalist on call if the hospitalist that took care of you is not available. Once you  are discharged, your primary care physician will handle any further medical issues. Please note that NO REFILLS for any discharge medications will be authorized once you are discharged, as it is imperative that you return to your  primary care physician (or establish a relationship with a primary care physician if you do not have one) for your aftercare needs so that they can reassess your need for medications and monitor your lab values.  Discharge Instructions    Diet - low sodium heart healthy    Complete by:  As directed      Increase activity slowly    Complete by:  As directed             Medication List    TAKE these medications        albuterol (2.5 MG/3ML) 0.083% nebulizer solution  Commonly known as:  PROVENTIL  Take 3 mLs (2.5 mg total) by nebulization every 4 (four) hours as needed for wheezing or shortness of breath.     ALPRAZolam 0.5 MG tablet  Commonly known as:  XANAX  TAKE 1 TABLET BY MOUTH AT BEDTIME AS NEEDED     aspirin 81 MG tablet  Take 81 mg by mouth every other day.     b complex-C-folic acid 1 MG capsule  Take 1 capsule by mouth daily.     buPROPion 150 MG 24 hr tablet  Commonly known as:  WELLBUTRIN XL  TAKE 1 TABLET DAILY     celecoxib 200 MG capsule  Commonly known as:  CELEBREX  Take 1 capsule (200 mg total) by mouth daily.     DULoxetine 60 MG capsule  Commonly known as:  CYMBALTA  TAKE 1 CAPSULE DAILY     erythromycin ophthalmic ointment  Place 1 application into the right eye 2 (two) times daily.     feeding supplement Liqd  Take 1 Container by mouth 3 (three) times daily between meals.     fluticasone 50 MCG/ACT nasal spray  Commonly known as:  FLONASE  Place 2 sprays into both nostrils daily.     Fluticasone-Salmeterol 250-50 MCG/DOSE Aepb  Commonly known as:  ADVAIR DISKUS  Inhale 1 puff into the lungs 2 (two) times daily.     guaiFENesin-dextromethorphan 100-10 MG/5ML syrup  Commonly known as:  ROBITUSSIN DM  Take 5 mLs by mouth every 4 (four) hours as needed for cough.     levofloxacin 500 MG tablet  Commonly known as:  LEVAQUIN  Take 1 tablet (500 mg total) by mouth daily.     levothyroxine 100 MCG tablet  Commonly known as:  SYNTHROID,  LEVOTHROID  TAKE 1 TABLET EVERY MORNING     lisinopril-hydrochlorothiazide 10-12.5 MG tablet  Commonly known as:  PRINZIDE,ZESTORETIC  TAKE 1 TABLET DAILY     loratadine 10 MG tablet  Commonly known as:  CLARITIN  TAKE 1 TABLET DAILY     mometasone 0.1 % ointment  Commonly known as:  ELOCON  Apply topically daily.     oxyCODONE-acetaminophen 5-325 MG tablet  Commonly known as:  PERCOCET/ROXICET  Take 1-2 tablets by mouth every 6 (six) hours as needed for severe pain.     predniSONE 10 MG tablet  Commonly known as:  DELTASONE  50 mg po daily x 3 days, then 40 mg po daily x 3 days, then 30 mg x 3 days, then '20mg'$  x 3 days, then '10mg'$  x 3 days     simvastatin 40 MG tablet  Commonly known as:  ZOCOR  Take 1 tablet (40 mg total) by mouth at bedtime.     traZODone 50 MG tablet  Commonly known as:  DESYREL  Take 50 mg by mouth at bedtime as needed for sleep.     VITAMIN D (CHOLECALCIFEROL) PO  Take 1 tablet by mouth daily.       Allergies  Allergen Reactions  . Doxycycline Nausea Only  . Erythromycin Nausea Only  . Sulfonamide Derivatives Hives       Follow-up Information    Follow up with [facility].   Why:  for home oxygen, will be delivered to room prior to discharge   Contact information:   [address] [place] [address] [phone]       Follow up with [facility]   Specialty:  Rehabilitation   Why:  will call next week to schedule our first out patient physical therapy appointment. If yo do not hear form them, please call to schedule your appointnment.   Contact information:   [address] [place] [id number] [place] [phone]       The results of significant diagnostics from this hospitalization (including imaging, microbiology, ancillary and laboratory) are listed below for reference.    Significant Diagnostic Studies: Dg Chest Portable 1  View  07/28/2015  CLINICAL DATA:  Short of breath 4 days EXAM: PORTABLE CHEST 1 VIEW COMPARISON:  06/18/2015, CT 12/03/2013 FINDINGS: Normal cardiac silhouette. There is a band of linear thickening in the LEFT upper lobe similar to comparison exam. This corresponds to a masslike lesion on CT of 12/03/2013. RIGHT lung is relatively clear. Lungs are hyperinflated. IMPRESSION: 1. Masslike thickening in the LEFT upper lobe corresponds to indeterminate mass on CT of 12/03/2013. If no interval workup, recommend repeat contrast CT to compared to CT 12/03/2013. 2. Hyperinflated lungs. Electronically Signed   By: Suzy Bouchard M.D.   On: 07/28/2015 10:31    Microbiology: No results found for this or any previous visit (from the past 240 hour(s)).   Labs: Basic Metabolic Panel:  Recent Labs Lab 07/28/15 0957 07/29/15 0614  NA 137 137  K 4.6 4.3  CL 103 101  CO2 23 28  GLUCOSE 129* 152*  BUN 11 16  CREATININE 0.68 0.56  CALCIUM 9.3 9.3   Liver Function Tests: No results for input(s): AST, ALT, ALKPHOS, BILITOT, PROT, ALBUMIN in the last 168 hours. No results for input(s): LIPASE, AMYLASE in the last 168 hours. No results for input(s): AMMONIA in the last 168 hours. CBC:  Recent Labs Lab 07/28/15 0957 07/29/15 0614  WBC 9.7 10.8*  NEUTROABS 7.6  --   HGB 15.8* 14.2  HCT 45.7 41.8  MCV 101.3* 101.2*  PLT 216 239   Cardiac Enzymes: No results for input(s): CKTOTAL, CKMB, CKMBINDEX, TROPONINI in the last 168 hours. BNP: BNP (last 3 results)  Recent Labs  07/29/15 1400  BNP 40.3    ProBNP (last 3 results) No results for input(s): PROBNP in the last 8760 hours.  CBG: No results for input(s): GLUCAP in the last 168 hours.     Signed:  Annita Brod  [facility] 07/31/2015, 4:46 PM

## 2015-07-31 NOTE — Progress Notes (Signed)
Daily Progress Note   Patient Name: [name]       Date: 07/31/2015 DOB: [birth year]  Age: 74 y.o. MRN#: [id number] Attending Physician: Annita Brod, MD Primary Care Physician: Odette Fraction, MD Admit Date: 07/28/2015  Reason for Consultation/Follow-up: Establishing goals of care and Hospice Evaluation  Subjective: Pt reports feeling better but sleeping alot Interval Events: None. Has been on 02 continuously Length of Stay: 3 days  Current Medications: Scheduled Meds:  . aspirin EC  81 mg Oral QODAY  . budesonide (PULMICORT) nebulizer solution  0.25 mg Nebulization BID  . buPROPion  150 mg Oral Daily  . celecoxib  200 mg Oral Daily  . DULoxetine  60 mg Oral Daily  . enoxaparin (LOVENOX) injection  40 mg Subcutaneous Q24H  . erythromycin  1 application Right Eye BID  . feeding supplement  1 Container Oral TID BM  . fluticasone  2 spray Each Nare Daily  . lisinopril  10 mg Oral Daily   And  . hydrochlorothiazide  12.5 mg Oral Daily  . ipratropium  0.5 mg Nebulization Q6H  . levalbuterol  0.63 mg Nebulization Q6H  . levofloxacin  500 mg Oral Daily  . levothyroxine  100 mcg Oral q morning - 10a  . loratadine  10 mg Oral Daily  . methylPREDNISolone (SOLU-MEDROL) injection  40 mg Intravenous Q12H  . multivitamin  1 tablet Oral QHS  . simvastatin  40 mg Oral QHS  . sodium chloride  3 mL Intravenous Q12H    Continuous Infusions:    PRN Meds: sodium chloride, acetaminophen **OR** acetaminophen, ALPRAZolam, alum & mag hydroxide-simeth, bisacodyl, guaiFENesin-dextromethorphan, levalbuterol, ondansetron **OR** ondansetron (ZOFRAN) IV, oxyCODONE-acetaminophen, sodium chloride, traZODone  Physical Exam: Physical Exam  Constitutional: She is oriented to person,  place, and time. She appears well-developed.  HENT:  Head: Normocephalic.  Neck: Normal range of motion.  Cardiovascular: Normal rate and regular rhythm.   Pulmonary/Chest: She has rales.  Increased work of breathing with exertion  Abdominal: Soft.  Neurological: She is alert and oriented to person, place, and time.  Skin: Skin is warm and dry.  Psychiatric: She has a normal mood and affect.                Vital Signs: BP 118/79 mmHg  Pulse 67  Temp(Src) 97.5 F (  36.4 C) (Oral)  Resp 18  Ht '5\' 7"'$  (1.702 m)  Wt 72.3 kg (159 lb 6.3 oz)  BMI 24.96 kg/m2  SpO2 97% SpO2: SpO2: 97 % O2 Device: O2 Device: Nasal Cannula O2 Flow Rate: O2 Flow Rate (L/min): 2 L/min  Intake/output summary:  Intake/Output Summary (Last 24 hours) at 07/31/15 1317 Last data filed at 07/31/15 0350  Gross per 24 hour  Intake    480 ml  Output    400 ml  Net     80 ml   LBM: Last BM Date: 07/27/15 Baseline Weight: Weight: 72.3 kg (159 lb 6.3 oz) Most recent weight: Weight: 72.3 kg (159 lb 6.3 oz)       Palliative Assessment/Data: Flowsheet Rows        Most Recent Value   Intake Tab    Referral Department  Hospitalist   Unit at Time of Referral  Med/Surg Unit   Palliative Care Primary Diagnosis  Pulmonary   Date Notified  07/29/15   Palliative Care Type  New Palliative care   Reason for referral  Clarify Goals of Care, Counsel Regarding Hospice   Date of Admission  07/28/15   Date first seen by Palliative Care  07/30/15   # of days Palliative referral response time  1 Day(s)   # of days IP prior to Palliative referral  1   Clinical Assessment    Palliative Performance Scale Score  60%   Pain Max last 24 hours  1   Pain Min Last 24 hours  1   Dyspnea Max Last 24 Hours  5   Dyspnea Min Last 24 hours  4   Nausea Max Last 24 Hours  0   Nausea Min Last 24 Hours  0   Anxiety Max Last 24 Hours  3   Anxiety Min Last 24 Hours  2   Other Max Last 24 Hours  0   Psychosocial & Spiritual Assessment      Social Work Plan of Care  Clarified patient/family wishes with healthcare team, Referral to community resources   Palliative Care Outcomes    Patient/Family meeting held?  Yes   Who was at the meeting?  pt   Palliative Care Outcomes  Clarified goals of care, Transitioned to hospice   Patient/Family wishes: Interventions discontinued/not started   Antibiotics   Palliative Care follow-up planned  No      Additional Data Reviewed: CBC    Component Value Date/Time   WBC 10.8* 07/29/2015 0614   WBC 7.9 10/20/2009 0942   RBC 4.13 07/29/2015 0614   RBC 3.50* 10/20/2009 0942   HGB 14.2 07/29/2015 0614   HGB 11.4* 10/20/2009 0942   HCT 41.8 07/29/2015 0614   HCT 33.6* 10/20/2009 0942   PLT 239 07/29/2015 0614   PLT 357 10/20/2009 0942   MCV 101.2* 07/29/2015 0614   MCV 96.1 10/20/2009 0942   MCH 34.4* 07/29/2015 0614   MCH 32.7 10/20/2009 0942   MCHC 34.0 07/29/2015 0614   MCHC 34.0 10/20/2009 0942   RDW 14.0 07/29/2015 0614   RDW 15.3* 10/20/2009 0942   LYMPHSABS 1.2 07/28/2015 0957   LYMPHSABS 1.8 10/20/2009 0942   MONOABS 0.9 07/28/2015 0957   MONOABS 0.9 10/20/2009 0942   EOSABS 0.0 07/28/2015 0957   EOSABS 0.2 10/20/2009 0942   BASOSABS 0.0 07/28/2015 0957   BASOSABS 0.0 10/20/2009 0942    CMP     Component Value Date/Time   NA 137 07/29/2015 0614   K  4.3 07/29/2015 0614   CL 101 07/29/2015 0614   CO2 28 07/29/2015 0614   GLUCOSE 152* 07/29/2015 0614   BUN 16 07/29/2015 0614   CREATININE 0.56 07/29/2015 0614   CREATININE 0.66 01/16/2014 0949   CALCIUM 9.3 07/29/2015 0614   PROT 6.4 01/16/2014 0949   ALBUMIN 4.0 01/16/2014 0949   AST 17 01/16/2014 0949   ALT 14 01/16/2014 0949   ALKPHOS 69 01/16/2014 0949   BILITOT 0.3 01/16/2014 0949   GFRNONAA >60 07/29/2015 0614   GFRNONAA 89 01/16/2014 0949   GFRAA >60 07/29/2015 0614   GFRAA >89 01/16/2014 0949       Problem List:  Patient Active Problem List   Diagnosis Date Noted  . Palliative care  encounter   . Tobacco abuse 07/29/2015  . Anxiety state 07/29/2015  . COPD exacerbation ([place]) 07/28/2015  . Acute respiratory failure with hypoxia ([place]) 07/28/2015  . Polycythemia 07/28/2015  . Nocturnal hypoxia 07/26/2015  . Atypical chest pain 12/01/2013  . Other malaise and fatigue 02/15/2013  . Hypothyroidism 02/15/2013  . Stye 02/15/2013  . Neck pain 02/15/2013  . Lung cancer ([place]) 08/31/2011  . PREMATURE VENTRICULAR CONTRACTIONS 07/03/2007  . ALLERGIC RHINITIS 07/03/2007  . COPD, MILD 07/03/2007  . EMPYEMA 07/03/2007  . GASTROINTESTINAL HEMORRHAGE 07/03/2007  . MITRAL VALVE PROLAPSE, HX OF 07/03/2007  . OOPHORECTOMY, LEFT, HX OF 07/03/2007     Palliative Care Assessment & Plan    1.Code Status:  DNR    Code Status Orders        Start     Ordered   07/28/15 1501  Do not attempt resuscitation (DNR)   Continuous    Question Answer Comment  In the event of cardiac or respiratory ARREST Do not call a "code blue"   In the event of cardiac or respiratory ARREST Do not perform Intubation, CPR, defibrillation or ACLS   In the event of cardiac or respiratory ARREST Use medication by any route, position, wound care, and other measures to relive pain and suffering. May use oxygen, suction and manual treatment of airway obstruction as needed for comfort.      07/28/15 1500    Advance Directive Documentation        Most Recent Value   Type of Advance Directive  Healthcare Power of Attorney, Living will   Pre-existing out of facility DNR order (yellow form or pink MOST form)     "MOST" Form in Place?         2. Goals of Care/Additional Recommendations:  Hospice consult for services in the home  Limitations on Scope of Treatment: Avoid Hospitalization  Desire for further Chaplaincy support:no  Psycho-social Needs: none   3Palliative Prophylaxis:   Bowel Regimen  4. Prognosis: Less than 6 months  6. Discharge Planning:  Home with Hospice   Care plan was  discussed with Dr. Clide Deutscher  Thank you for allowing the Palliative Medicine Team to assist in the care of this patient.   Time In: 1300 Time Out: 1315 Total Time 15 min Prolonged Time Billed  no         Dory Horn, NP  07/31/2015, 1:17 PM  Please contact Palliative Medicine Team phone at [phone] for questions and concerns.

## 2015-08-01 ENCOUNTER — Other Ambulatory Visit: Payer: Self-pay | Admitting: Family Medicine

## 2015-08-02 NOTE — Telephone Encounter (Signed)
Ok to refill??  Last office visit 07/28/2015.  Last refill 05/31/2015.

## 2015-08-02 NOTE — Telephone Encounter (Signed)
ok 

## 2015-08-02 NOTE — Telephone Encounter (Signed)
Medication called to pharmacy. 

## 2015-08-03 ENCOUNTER — Other Ambulatory Visit: Payer: Self-pay | Admitting: Family Medicine

## 2015-08-12 ENCOUNTER — Telehealth: Payer: Self-pay | Admitting: Family Medicine

## 2015-08-12 DIAGNOSIS — J441 Chronic obstructive pulmonary disease with (acute) exacerbation: Secondary | ICD-10-CM

## 2015-08-12 DIAGNOSIS — G4734 Idiopathic sleep related nonobstructive alveolar hypoventilation: Secondary | ICD-10-CM

## 2015-08-12 NOTE — Telephone Encounter (Signed)
?  ok to do 90 on xanax

## 2015-08-12 NOTE — Telephone Encounter (Signed)
ok 

## 2015-08-12 NOTE — Telephone Encounter (Signed)
[facility] left a message requesting an authorization for a 90 day supply of alprazolam and albuterol. Ph: [phone] Fax: [phone]

## 2015-08-13 ENCOUNTER — Encounter: Payer: Self-pay | Admitting: Family Medicine

## 2015-08-13 ENCOUNTER — Ambulatory Visit (INDEPENDENT_AMBULATORY_CARE_PROVIDER_SITE_OTHER): Payer: Medicare Other | Admitting: Family Medicine

## 2015-08-13 VITALS — BP 140/88 | HR 82 | Temp 98.1°F | Resp 16 | Ht 67.5 in | Wt 162.0 lb

## 2015-08-13 DIAGNOSIS — G4734 Idiopathic sleep related nonobstructive alveolar hypoventilation: Secondary | ICD-10-CM

## 2015-08-13 DIAGNOSIS — J449 Chronic obstructive pulmonary disease, unspecified: Secondary | ICD-10-CM

## 2015-08-13 MED ORDER — ALBUTEROL SULFATE (2.5 MG/3ML) 0.083% IN NEBU: 2.5000 mg | INHALATION_SOLUTION | RESPIRATORY_TRACT | Status: DC | PRN

## 2015-08-13 MED ORDER — ALPRAZOLAM 0.5 MG PO TABS: ORAL_TABLET | ORAL | Status: DC

## 2015-08-13 NOTE — Assessment & Plan Note (Signed)
S/p admission, now with home oxygen andmuch improved. Note she is not on Hospice. I will clarify this long term chronic illness program. She will continue current meds and nebulzer treatments Flu shot UTD.

## 2015-08-13 NOTE — Telephone Encounter (Signed)
Meds faxed to requested pharmacy

## 2015-08-13 NOTE — Progress Notes (Signed)
Patient ID: [name], female   DOB: 1941-02-25, 74 y.o.   MRN: [id number]    Subjective:    Patient ID: [name], female    DOB: 17-Feb-1941, 74 y.o.   MRN: [id number]  Patient presents for F/U COPD Exacerbation    patient here to follow-up hospitalization for COPD. She has home oxygen. She states that she feels much better. She's completed her prednisone about 4 days ago. She still has a mild cough but overall is able to do her activities without difficulty area there was some confusion about whether she was sent home with hospice. She states that she was told they had some type of chronic illness program which she did not have the specifics. She has not been contacted since she was sent out from the hospital. She is using her oxygen at bedtime. She is monitoring her oxygen saturation during the day is about 97% without oxygen. She has no new concerns today.    Review Of Systems:  GEN- denies fatigue, fever, weight loss,weakness, recent illness HEENT- denies eye drainage, change in vision, nasal discharge, CVS- denies chest pain, palpitations RESP- denies SOB, cough, wheeze ABD- denies N/V, change in stools, abd pain GU- denies dysuria, hematuria, dribbling, incontinence MSK- denies joint pain, muscle aches, injury Neuro- denies headache, dizziness, syncope, seizure activity       Objective:    BP 140/88 mmHg  Pulse 82  Temp(Src) 98.1 F (36.7 C) (Oral)  Resp 16  Ht 5' 7.5" (1.715 m)  Wt 162 lb (73.483 kg)  BMI 24.98 kg/m2  SpO2 97% GEN- NAD, alert and oriented x3 HEENT- PERRL, EOMI, non injected sclera, pink conjunctiva, MMM, oropharynx clear Neck- Supple, no thyromegaly CVS- RRR, no murmur RESP-CTAB, no wheeze, normal WOB ABD-NABS,soft,NT,ND EXT- No edema Pulses- Radial, DP- 2+        Assessment & Plan:      Problem List Items Addressed This Visit    Nocturnal hypoxia - Primary   Relevant Medications   albuterol (PROVENTIL) (2.5 MG/3ML) 0.083%  nebulizer solution   COPD, moderate (HCC)    S/p admission, now with home oxygen andmuch improved. Note she is not on Hospice. I will clarify this long term chronic illness program. She will continue current meds and nebulzer treatments Flu shot UTD.       Relevant Medications   albuterol (PROVENTIL) (2.5 MG/3ML) 0.083% nebulizer solution      Note: This dictation was prepared with Dragon dictation along with smaller phrase technology. Any transcriptional errors that result from this process are unintentional.

## 2015-08-13 NOTE — Patient Instructions (Addendum)
We will call Palliative Care about their chronic illness program Continue current medications F/U as needed

## 2015-08-28 ENCOUNTER — Other Ambulatory Visit: Payer: Self-pay | Admitting: Family Medicine

## 2015-09-08 ENCOUNTER — Encounter (HOSPITAL_BASED_OUTPATIENT_CLINIC_OR_DEPARTMENT_OTHER): Payer: Medicare Other

## 2015-10-07 ENCOUNTER — Telehealth: Payer: Self-pay | Admitting: Family Medicine

## 2015-10-07 NOTE — Telephone Encounter (Signed)
Patient has new pharmacy and needs rx for her tramadol to go to [facility] [address]

## 2015-10-07 NOTE — Telephone Encounter (Signed)
?   OK to Refill  

## 2015-10-07 NOTE — Telephone Encounter (Signed)
ok 

## 2015-10-07 NOTE — Telephone Encounter (Signed)
[facility] - do not see tramadol on med list - ?trazodone refill - also need to inform her of Doug's test results

## 2015-10-12 ENCOUNTER — Other Ambulatory Visit: Payer: Self-pay | Admitting: Family Medicine

## 2015-10-12 NOTE — Telephone Encounter (Signed)
Refill appropriate and filled per protocol. 

## 2015-10-14 NOTE — Telephone Encounter (Signed)
LMTRC

## 2015-10-15 NOTE — Telephone Encounter (Signed)
Pt in [facility] until March

## 2015-12-03 ENCOUNTER — Encounter: Payer: Self-pay | Admitting: Family Medicine

## 2015-12-03 ENCOUNTER — Ambulatory Visit (INDEPENDENT_AMBULATORY_CARE_PROVIDER_SITE_OTHER): Payer: Medicare Other | Admitting: Family Medicine

## 2015-12-03 VITALS — BP 146/94 | HR 76 | Temp 98.2°F | Resp 20 | Ht 67.5 in | Wt 166.0 lb

## 2015-12-03 DIAGNOSIS — L723 Sebaceous cyst: Secondary | ICD-10-CM | POA: Diagnosis not present

## 2015-12-03 NOTE — Progress Notes (Signed)
Subjective:    Patient ID: [name], female    DOB: [birth year], 75 y.o.   MRN: [id number]  HPI The patient has 2 issues.  First, the patient was admitted to the hospital in [place] one year ago. She received some sort of injection into the right hip just superior to the greater trochanter. Ever since that time the patient reports a subcutaneous mass at the site of the injection. Recently it became very tender and swollen. She states that it was larger than a Pharmacist, community. Spontaneously ruptured 2 weeks ago. On examination today she has a elliptical propofol macule over her right hip with a 3 mm opening in the center of the macule that is draining trace yellow exudate. I probed the hole. It is only 1 cm deep. A wound culture was sent. A small cyst sac pes was removed from the opening. I believe this is a ruptured sebaceous cyst. Second the patient has a history of nocturnal hypoxia requiring 2 L of nasal cannula at night. She was found to have significant hypoxia with oxygen saturation dropping less than 85% for more than half of the night that she slept. She needs a portable oxygen concentrator for when she travels. Past Medical History  Diagnosis Date  . Allergic rhinitis   . Anemia   . Diverticula of colon   . Gastric ulcer with hemorrhage   . Esophagitis   . Arrhythmia   . Aseptic necrosis ([place])   . Osteoporosis   . Hyperlipidemia   . Anxiety   . Depression   . COPD (chronic obstructive pulmonary disease) ([place])   . GERD (gastroesophageal reflux disease)   . Hypertension   . Kidney stones   . Heart murmur   . Mitral valve prolapse     "no ORs" (07/28/2015)  . Asthma dxc'd 05/2015  . Pneumonia 1970's; 06/2005  . Sleep apnea dx'd 06/2015    "haven't had followup appt yet" 07/28/2015)  . Hypothyroidism   . History of blood transfusion 06/2005    "related to pneumonia"  . Bleeding stomach ulcer ~ 06/2005  . Arthritis     "hands, neck, right shoulder" (07/28/2015)  . Basal  cell cancer   . Lung cancer ([place])   . Squamous cell skin cancer    Past Surgical History  Procedure Laterality Date  . Salpingoophorectomy Left 1988  . Thoracotomy Left 06/2005  . Skin cancer excision      "I've had severl cut off RLE; left foreqrm, nose under nose"  . Hammer toe surgery Right ~ 2010  . Thoracotomy/lobectomy  09/2009; 11/2009    left; right  . Abdominal hysterectomy  1988   Current Outpatient Prescriptions on File Prior to Visit  Medication Sig Dispense Refill  . albuterol (PROVENTIL) (2.5 MG/3ML) 0.083% nebulizer solution Take 3 mLs (2.5 mg total) by nebulization every 4 (four) hours as needed for wheezing or shortness of breath. 450 mL 3  . ALPRAZolam (XANAX) 0.5 MG tablet TAKE 1 TABLET BY MOUTH EVERY DAY AT BEDTIME AS NEEDED ANXIETY OR INSOMNIA 90 tablet 0  . aspirin 81 MG tablet Take 81 mg by mouth every other day.     . b complex-C-folic acid 1 MG capsule Take 1 capsule by mouth daily.      Marland Kitchen buPROPion (WELLBUTRIN XL) 150 MG 24 hr tablet TAKE 1 TABLET DAILY 90 tablet 4  . celecoxib (CELEBREX) 200 MG capsule TAKE 1 CAPSULE DAILY 90 capsule 2  . DULoxetine (CYMBALTA) 60 MG  capsule TAKE 1 CAPSULE DAILY 90 capsule 3  . fluticasone (FLONASE) 50 MCG/ACT nasal spray Place 2 sprays into both nostrils daily. 48 g 3  . Fluticasone-Salmeterol (ADVAIR DISKUS) 250-50 MCG/DOSE AEPB Inhale 1 puff into the lungs 2 (two) times daily. 3 each 3  . levothyroxine (SYNTHROID, LEVOTHROID) 100 MCG tablet TAKE 1 TABLET EVERY MORNING 90 tablet 3  . lisinopril-hydrochlorothiazide (PRINZIDE,ZESTORETIC) 10-12.5 MG per tablet TAKE 1 TABLET DAILY 90 tablet 3  . loratadine (CLARITIN) 10 MG tablet TAKE 1 TABLET DAILY 90 tablet 3  . mometasone (ELOCON) 0.1 % ointment Apply topically daily. 45 g 0  . oxyCODONE-acetaminophen (PERCOCET/ROXICET) 5-325 MG per tablet Take 1-2 tablets by mouth every 6 (six) hours as needed for severe pain. 20 tablet 0  . simvastatin (ZOCOR) 40 MG tablet Take 1 tablet (40  mg total) by mouth at bedtime. 90 tablet 3  . SPIRIVA HANDIHALER 18 MCG inhalation capsule PLACE 1 CAPSULE INTO HANDIHALER AND INHALE THE CONTENTS OF 1 CAPSULE DAILY 90 capsule 2  . traZODone (DESYREL) 50 MG tablet Take 50 mg by mouth at bedtime as needed for sleep.     Marland Kitchen VITAMIN D, CHOLECALCIFEROL, PO Take 1 tablet by mouth daily.     No current facility-administered medications on file prior to visit.   Allergies  Allergen Reactions  . Doxycycline Nausea Only  . Erythromycin Nausea Only  . Sulfonamide Derivatives Hives   Social History   Social History  . Marital Status: Married    Spouse Name: N/A  . Number of Children: 3  . Years of Education: N/A   Occupational History  . retired Marine scientist    Social History Main Topics  . Smoking status: Former Smoker -- 0.00 packs/day for 43 years    Types: Cigarettes  . Smokeless tobacco: Never Used  . Alcohol Use: 4.2 oz/week    7 Shots of liquor per week  . Drug Use: No  . Sexual Activity: Yes   Other Topics Concern  . Not on file   Social History Narrative      Review of Systems  All other systems reviewed and are negative.      Objective:   Physical Exam  Cardiovascular: Normal rate, regular rhythm and normal heart sounds.   Pulmonary/Chest: Effort normal and breath sounds normal.  Vitals reviewed.         Assessment & Plan:  Sebaceous cyst - Plan: Culture, routine-abscess  I believe the patient developed a sebaceous cyst at the site of the injection she had in [place]. I believe it is been recently irritated and inflamed and then spontaneously drained. I recommended that we monitor the area for one additional month. If it is not healing we could excise the entire area. If it does spontaneously heal no further treatment is necessary. I will send a wound culture to prove that this is not infected. The patient a portable oxygen concentrator so that she can use this at night whenever she travels away from her home

## 2015-12-06 LAB — CULTURE, ROUTINE-ABSCESS
Gram Stain: NONE SEEN
Gram Stain: NONE SEEN
Organism ID, Bacteria: NO GROWTH

## 2015-12-13 DIAGNOSIS — H40013 Open angle with borderline findings, low risk, bilateral: Secondary | ICD-10-CM | POA: Diagnosis not present

## 2015-12-13 DIAGNOSIS — H04123 Dry eye syndrome of bilateral lacrimal glands: Secondary | ICD-10-CM | POA: Diagnosis not present

## 2015-12-13 DIAGNOSIS — H04211 Epiphora due to excess lacrimation, right lacrimal gland: Secondary | ICD-10-CM | POA: Diagnosis not present

## 2015-12-16 IMAGING — CR DG CHEST 1V PORT
1 series · 1 of 1 positions shown · non-contrast
Comparison: 06/18/2015, CT 12/03/2013

CLINICAL DATA: Short of breath 4 days

EXAM:
PORTABLE CHEST 1 VIEW

[AP]
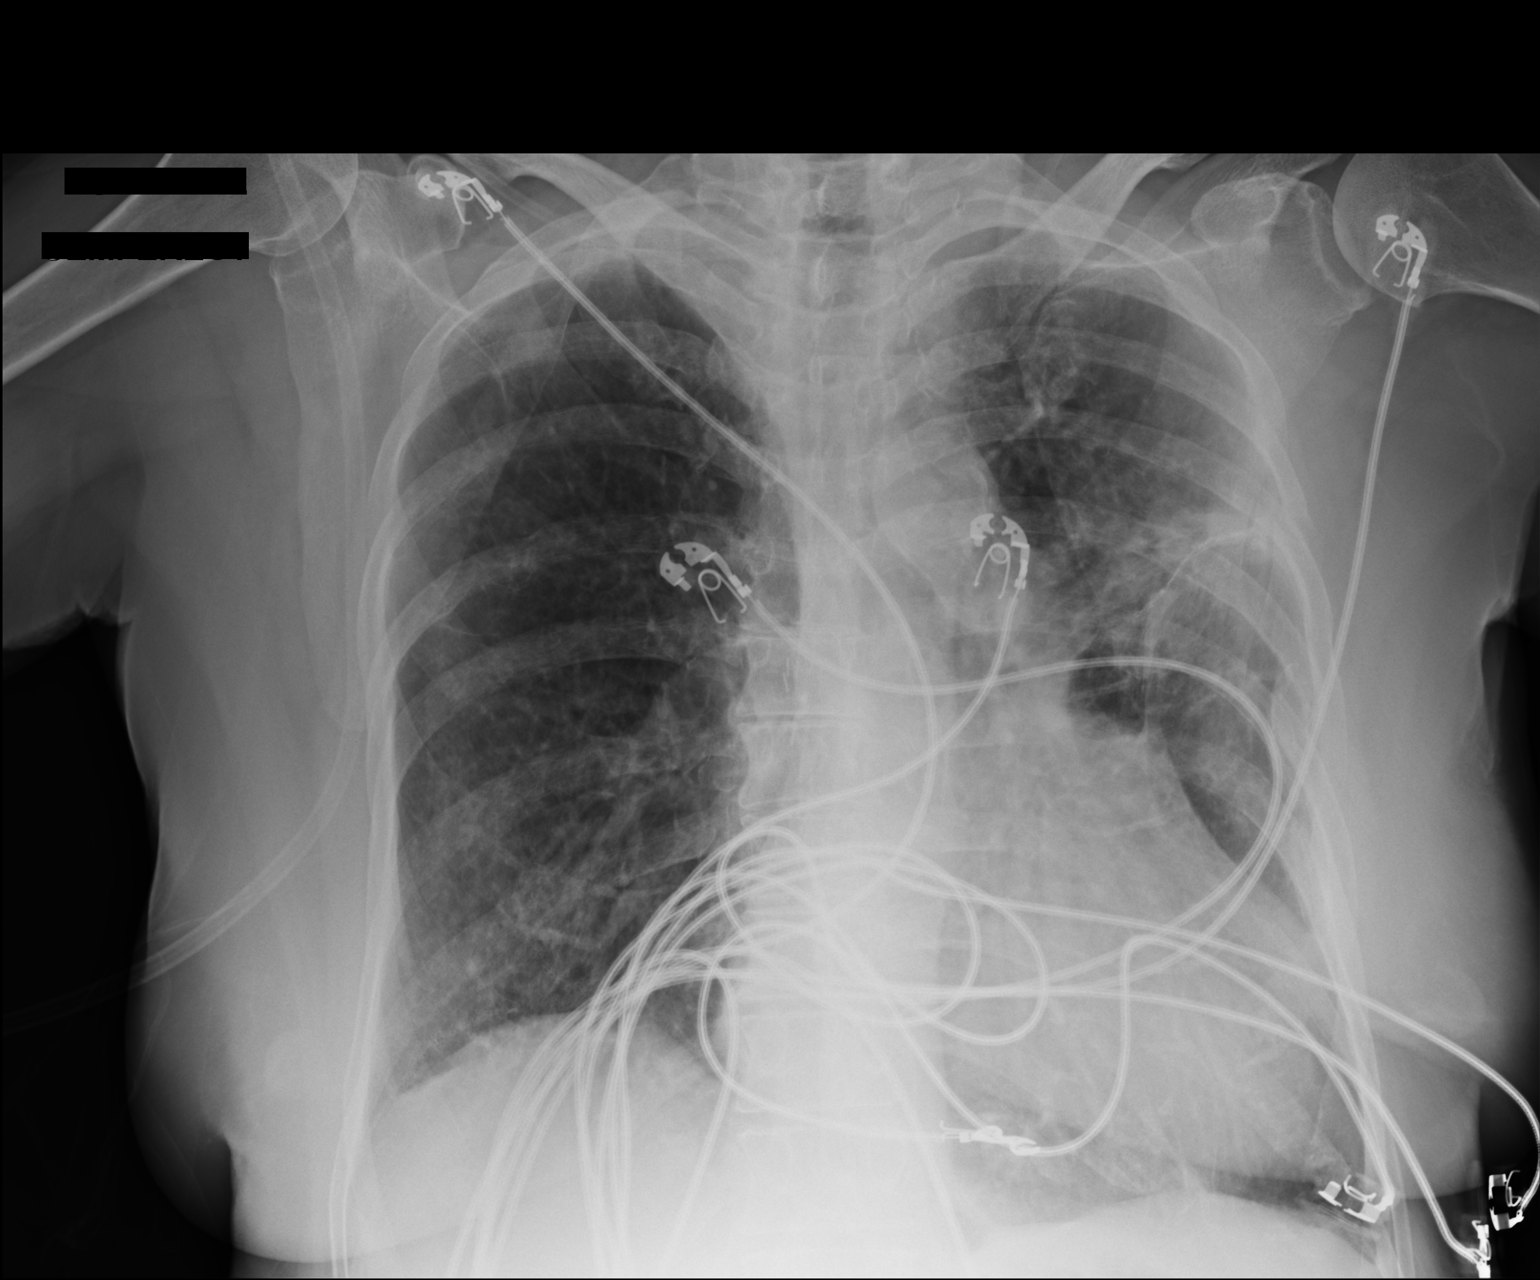

[1 of 1 positions shown; findings below may reference images not displayed]

FINDINGS: Normal cardiac silhouette. There is a band of linear thickening in
the LEFT upper lobe similar to comparison exam. This corresponds to
a masslike lesion on CT of 12/03/2013. RIGHT lung is relatively
clear. Lungs are hyperinflated.
IMPRESSION: 1. Masslike thickening in the LEFT upper lobe corresponds to
indeterminate mass on CT of 12/03/2013. If no interval workup,
recommend repeat contrast CT to compared to CT 12/03/2013.
[DATE]. Hyperinflated lungs.

## 2015-12-20 DIAGNOSIS — R062 Wheezing: Secondary | ICD-10-CM | POA: Diagnosis not present

## 2015-12-20 DIAGNOSIS — Z85118 Personal history of other malignant neoplasm of bronchus and lung: Secondary | ICD-10-CM | POA: Diagnosis not present

## 2015-12-20 DIAGNOSIS — C3492 Malignant neoplasm of unspecified part of left bronchus or lung: Secondary | ICD-10-CM | POA: Diagnosis not present

## 2015-12-20 DIAGNOSIS — Z08 Encounter for follow-up examination after completed treatment for malignant neoplasm: Secondary | ICD-10-CM | POA: Diagnosis not present

## 2015-12-20 DIAGNOSIS — R0602 Shortness of breath: Secondary | ICD-10-CM | POA: Diagnosis not present

## 2015-12-20 DIAGNOSIS — Z902 Acquired absence of lung [part of]: Secondary | ICD-10-CM | POA: Diagnosis not present

## 2015-12-20 DIAGNOSIS — M549 Dorsalgia, unspecified: Secondary | ICD-10-CM | POA: Diagnosis not present

## 2015-12-20 DIAGNOSIS — R0902 Hypoxemia: Secondary | ICD-10-CM | POA: Diagnosis not present

## 2015-12-20 DIAGNOSIS — R202 Paresthesia of skin: Secondary | ICD-10-CM | POA: Diagnosis not present

## 2015-12-20 DIAGNOSIS — Z79899 Other long term (current) drug therapy: Secondary | ICD-10-CM | POA: Diagnosis not present

## 2015-12-20 DIAGNOSIS — K449 Diaphragmatic hernia without obstruction or gangrene: Secondary | ICD-10-CM | POA: Diagnosis not present

## 2015-12-20 DIAGNOSIS — Z87891 Personal history of nicotine dependence: Secondary | ICD-10-CM | POA: Diagnosis not present

## 2015-12-20 DIAGNOSIS — C3411 Malignant neoplasm of upper lobe, right bronchus or lung: Secondary | ICD-10-CM | POA: Diagnosis not present

## 2015-12-20 DIAGNOSIS — J9611 Chronic respiratory failure with hypoxia: Secondary | ICD-10-CM | POA: Diagnosis not present

## 2015-12-20 DIAGNOSIS — C3412 Malignant neoplasm of upper lobe, left bronchus or lung: Secondary | ICD-10-CM | POA: Diagnosis not present

## 2015-12-20 DIAGNOSIS — R0600 Dyspnea, unspecified: Secondary | ICD-10-CM | POA: Diagnosis not present

## 2015-12-20 DIAGNOSIS — D751 Secondary polycythemia: Secondary | ICD-10-CM | POA: Diagnosis not present

## 2015-12-20 DIAGNOSIS — M5136 Other intervertebral disc degeneration, lumbar region: Secondary | ICD-10-CM | POA: Diagnosis not present

## 2015-12-20 DIAGNOSIS — R200 Anesthesia of skin: Secondary | ICD-10-CM | POA: Diagnosis not present

## 2015-12-20 DIAGNOSIS — R918 Other nonspecific abnormal finding of lung field: Secondary | ICD-10-CM | POA: Diagnosis not present

## 2016-01-24 ENCOUNTER — Encounter: Payer: Self-pay | Admitting: Family Medicine

## 2016-01-24 ENCOUNTER — Ambulatory Visit (INDEPENDENT_AMBULATORY_CARE_PROVIDER_SITE_OTHER): Payer: Medicare Other | Admitting: Family Medicine

## 2016-01-24 VITALS — BP 128/72 | HR 74 | Temp 98.8°F | Resp 18 | Ht 67.5 in | Wt 165.0 lb

## 2016-01-24 DIAGNOSIS — R197 Diarrhea, unspecified: Secondary | ICD-10-CM

## 2016-01-24 DIAGNOSIS — J449 Chronic obstructive pulmonary disease, unspecified: Secondary | ICD-10-CM

## 2016-01-24 DIAGNOSIS — J069 Acute upper respiratory infection, unspecified: Secondary | ICD-10-CM | POA: Diagnosis not present

## 2016-01-24 MED ORDER — PREDNISONE 20 MG PO TABS: 40.0000 mg | ORAL_TABLET | Freq: Every day | ORAL | Status: DC

## 2016-01-24 MED ORDER — AZITHROMYCIN 250 MG PO TABS: ORAL_TABLET | ORAL | Status: DC

## 2016-01-24 NOTE — Patient Instructions (Signed)
Start antibiotics  Take prednisone Use inhalers  F/U as needed

## 2016-01-24 NOTE — Progress Notes (Signed)
Patient ID: JODEEN MCLIN, female   DOB: 07/17/1941, 75 y.o.   MRN: [id number]    Subjective:    Patient ID: [name], female    DOB: [birth year], 75 y.o.   MRN: [id number]  Patient presents for Illness Patient here with mild cough and nasal drainage fatigue for the past couple days. She states that she had a lot of family events the past few days since Sunday began to feel sick. She does have underlying COPD but has not had any significant wheezing or shortness of breath. She has not had any fever. She is concerned as often the small illnesses flare her COPD which requires hospitalization. She is actually due to leave the country on Thursday with her husband to go to [place]. She has been using her inhalers as prescribed. She's also using nasal saline rinses for her sinus drainage. In sick contacts.  At the end of this she also states that she's had some loose stool the past few weeks typically only once or twice a day no abdominal pain no blood in the stool    Review Of Systems:  GEN- + fatigue, fever, weight loss,weakness, recent illness HEENT- denies eye drainage, change in vision, +nasal discharge, CVS- denies chest pain, palpitations RESP- denies SOB,+ cough, wheeze ABD- denies N/V, change in stools, abd pain GU- denies dysuria, hematuria, dribbling, incontinence MSK- denies joint pain, muscle aches, injury Neuro- denies headache, dizziness, syncope, seizure activity       Objective:    BP 128/72 mmHg  Pulse 74  Temp(Src) 98.8 F (37.1 C) (Oral)  Resp 18  Ht 5' 7.5" (1.715 m)  Wt 165 lb (74.844 kg)  BMI 25.45 kg/m2  SpO2 96% GEN- NAD, alert and oriented x3 HEENT- PERRL, EOMI, non injected sclera, pink conjunctiva, MMM, oropharynx clear, nares enlarged turbinates, clear rhinorrhea, mild maxillary sinus tenderness  Neck- Supple, no LAD  CVS- RRR, no murmur RESP-CTAB ABD-NABS,soft,NT,ND EXT- No edema Pulses- Radial, 2+        Assessment & Plan:       Problem List Items Addressed This Visit    COPD, moderate (HCC)   Relevant Medications   azithromycin (ZITHROMAX) 250 MG tablet   predniSONE (DELTASONE) 20 MG tablet    Other Visit Diagnoses    Acute URI    -  Primary    current URI,which may be starting to flare her COPD with all of her drainage, as she is leaving the country will start zpak, prednisone based on how easily she     Relevant Medications    azithromycin (ZITHROMAX) 250 MG tablet    Diarrhea, unspecified type        Looser stools past few weeks she stated at end of visit, will have her try probiotics, if this worsens to take immoidum, recommend evaluation of this with stool studies upon her return , exam benign       Note: This dictation was prepared with Dragon dictation along with smaller phrase technology. Any transcriptional errors that result from this process are unintentional.

## 2016-01-31 ENCOUNTER — Other Ambulatory Visit: Payer: Self-pay | Admitting: Family Medicine

## 2016-02-28 DIAGNOSIS — R0602 Shortness of breath: Secondary | ICD-10-CM | POA: Diagnosis not present

## 2016-02-28 DIAGNOSIS — R05 Cough: Secondary | ICD-10-CM | POA: Diagnosis not present

## 2016-02-28 DIAGNOSIS — J9620 Acute and chronic respiratory failure, unspecified whether with hypoxia or hypercapnia: Secondary | ICD-10-CM | POA: Diagnosis not present

## 2016-03-12 ENCOUNTER — Other Ambulatory Visit: Payer: Self-pay | Admitting: Family Medicine

## 2016-03-16 ENCOUNTER — Ambulatory Visit (INDEPENDENT_AMBULATORY_CARE_PROVIDER_SITE_OTHER): Payer: Medicare Other | Admitting: Physician Assistant

## 2016-03-16 ENCOUNTER — Encounter: Payer: Self-pay | Admitting: Physician Assistant

## 2016-03-16 VITALS — BP 132/78 | Temp 98.3°F | Ht 67.5 in | Wt 166.0 lb

## 2016-03-16 DIAGNOSIS — J449 Chronic obstructive pulmonary disease, unspecified: Secondary | ICD-10-CM

## 2016-03-16 DIAGNOSIS — B370 Candidal stomatitis: Secondary | ICD-10-CM | POA: Diagnosis not present

## 2016-03-16 MED ORDER — NYSTATIN 100000 UNIT/ML MT SUSP: 5.0000 mL | Freq: Four times a day (QID) | OROMUCOSAL | Status: DC

## 2016-03-16 NOTE — Progress Notes (Signed)
Patient ID: [name] MRN: [id number], DOB: Sep 05, 1941, 75 y.o. Date of Encounter: 03/16/2016, 11:30 AM    Chief Complaint:  Chief Complaint  Patient presents with  . Mouth Lesions    pt states that her mouth is sore, "raw", started about a month ago      HPI: 75 y.o. year old white female presents with above.   Says that her mouth has been feeling sore and raw. Says that it is been going on for over a month that has been very busy because her husband had a small stroke so she is just now coming in. No other complaints or concerns.Says that the whole mouth feels this way.      Home Meds:   Outpatient Prescriptions Prior to Visit  Medication Sig Dispense Refill  . albuterol (PROVENTIL) (2.5 MG/3ML) 0.083% nebulizer solution Take 3 mLs (2.5 mg total) by nebulization every 4 (four) hours as needed for wheezing or shortness of breath. 450 mL 3  . ALPRAZolam (XANAX) 0.5 MG tablet TAKE 1 TABLET BY MOUTH EVERY DAY AT BEDTIME AS NEEDED ANXIETY OR INSOMNIA 90 tablet 0  . aspirin 81 MG tablet Take 81 mg by mouth every other day.     Marland Kitchen azithromycin (ZITHROMAX) 250 MG tablet Take 2 tablets x 1 day, then 1 tab daily for 4 days 6 tablet 0  . b complex-C-folic acid 1 MG capsule Take 1 capsule by mouth daily.      Marland Kitchen buPROPion (WELLBUTRIN XL) 150 MG 24 hr tablet TAKE 1 TABLET DAILY 90 tablet 3  . celecoxib (CELEBREX) 200 MG capsule TAKE 1 CAPSULE DAILY 90 capsule 2  . DULoxetine (CYMBALTA) 60 MG capsule TAKE 1 CAPSULE DAILY 90 capsule 3  . fluticasone (FLONASE) 50 MCG/ACT nasal spray Place 2 sprays into both nostrils daily. 48 g 3  . Fluticasone-Salmeterol (ADVAIR DISKUS) 250-50 MCG/DOSE AEPB Inhale 1 puff into the lungs 2 (two) times daily. 3 each 3  . levothyroxine (SYNTHROID, LEVOTHROID) 100 MCG tablet TAKE 1 TABLET EVERY MORNING 90 tablet 3  . lisinopril-hydrochlorothiazide (PRINZIDE,ZESTORETIC) 10-12.5 MG per tablet TAKE 1 TABLET DAILY 90 tablet 3  . loratadine (CLARITIN) 10 MG  tablet TAKE 1 TABLET DAILY 90 tablet 3  . mometasone (ELOCON) 0.1 % ointment Apply topically daily. 45 g 0  . oxyCODONE-acetaminophen (PERCOCET/ROXICET) 5-325 MG per tablet Take 1-2 tablets by mouth every 6 (six) hours as needed for severe pain. 20 tablet 0  . predniSONE (DELTASONE) 20 MG tablet Take 2 tablets (40 mg total) by mouth daily with breakfast. 10 tablet 0  . simvastatin (ZOCOR) 40 MG tablet Take 1 tablet (40 mg total) by mouth at bedtime. 90 tablet 3  . traZODone (DESYREL) 50 MG tablet Take 50 mg by mouth at bedtime as needed for sleep.     Marland Kitchen VITAMIN D, CHOLECALCIFEROL, PO Take 1 tablet by mouth daily.     No facility-administered medications prior to visit.    Allergies:  Allergies  Allergen Reactions  . Doxycycline Nausea Only  . Erythromycin Nausea Only  . Sulfonamide Derivatives Hives      Review of Systems: See HPI for pertinent ROS. All other ROS negative.    Physical Exam: Blood pressure 132/78, temperature 98.3 F (36.8 C), temperature source Oral, height 5' 7.5" (1.715 m), weight 166 lb (75.297 kg)., Body mass index is 25.6 kg/(m^2). General:  WNWD WF. Appears in no acute distress. HEENT: tongue: there is area of tick coating on tongue that does not  scrape off with tongue depresser. There is small area on oral mucosa on left side of mouth that appears "raw". No other significant findings/abnormalities.  No other lesions seen on inspection. Neck: Supple. No thyromegaly. No lymphadenopathy. Lungs: Clear bilaterally to auscultation without wheezes, rales, or rhonchi. Breathing is unlabored. Heart: Regular rhythm. No murmurs, rubs, or gallops. Msk:  Strength and tone normal for age. Extremities/Skin: Warm and dry.  Neuro: Alert and oriented X 3. Moves all extremities spontaneously. Gait is normal. CNII-XII grossly in tact. Psych:  Responds to questions appropriately with a normal affect.     ASSESSMENT AND PLAN:  75 y.o. year old female with  1. Thrush -  nystatin (MYCOSTATIN) 100000 UNIT/ML suspension; Take 5 mLs (500,000 Units total) by mouth 4 (four) times daily. Swish and swallow.  Dispense: 60 mL; Refill: 0 She is to use 5 ML's swish and swallow 4 times daily after meals and at bedtime. If symptoms do not resolve in one week, then follow-up.  2. COPD, moderate ([place]) She has COPD, is on Advair.  Discussed to make sure to rinse mouth every time after using Advair.    Signed, [address] [place], [place], BSFM 03/16/2016 11:30 AM

## 2016-03-21 ENCOUNTER — Telehealth: Payer: Self-pay | Admitting: Family Medicine

## 2016-03-21 DIAGNOSIS — B370 Candidal stomatitis: Secondary | ICD-10-CM

## 2016-03-21 MED ORDER — NYSTATIN 100000 UNIT/ML MT SUSP: 5.0000 mL | Freq: Four times a day (QID) | OROMUCOSAL | Status: DC

## 2016-03-21 NOTE — Telephone Encounter (Signed)
Medication called/sent to requested pharmacy  

## 2016-03-21 NOTE — Telephone Encounter (Signed)
Pt is requesting a refill of Nystatin oral rinse for her thrush. [facility] [address]

## 2016-03-24 ENCOUNTER — Other Ambulatory Visit: Payer: Self-pay | Admitting: Family Medicine

## 2016-03-24 DIAGNOSIS — Z1231 Encounter for screening mammogram for malignant neoplasm of breast: Secondary | ICD-10-CM

## 2016-04-06 ENCOUNTER — Other Ambulatory Visit: Payer: Self-pay | Admitting: Family Medicine

## 2016-04-06 ENCOUNTER — Ambulatory Visit (INDEPENDENT_AMBULATORY_CARE_PROVIDER_SITE_OTHER): Payer: Medicare Other | Admitting: Family Medicine

## 2016-04-06 VITALS — BP 162/84 | HR 80 | Temp 98.1°F | Resp 16 | Ht 67.5 in | Wt 168.0 lb

## 2016-04-06 DIAGNOSIS — E2839 Other primary ovarian failure: Secondary | ICD-10-CM

## 2016-04-06 DIAGNOSIS — Z0000 Encounter for general adult medical examination without abnormal findings: Secondary | ICD-10-CM

## 2016-04-06 DIAGNOSIS — I10 Essential (primary) hypertension: Secondary | ICD-10-CM

## 2016-04-06 DIAGNOSIS — J449 Chronic obstructive pulmonary disease, unspecified: Secondary | ICD-10-CM

## 2016-04-06 DIAGNOSIS — Z85118 Personal history of other malignant neoplasm of bronchus and lung: Secondary | ICD-10-CM

## 2016-04-06 DIAGNOSIS — E785 Hyperlipidemia, unspecified: Secondary | ICD-10-CM

## 2016-04-06 LAB — CBC WITH DIFFERENTIAL/PLATELET
Basophils Absolute: 0 cells/uL (ref 0–200)
Basophils Relative: 0 %
Eosinophils Absolute: 156 cells/uL (ref 15–500)
Eosinophils Relative: 2 %
HCT: 45.2 % — ABNORMAL HIGH (ref 35.0–45.0)
Hemoglobin: 15 g/dL (ref 12.0–15.0)
Lymphocytes Relative: 25 %
Lymphs Abs: 1950 cells/uL (ref 850–3900)
MCH: 33.6 pg — ABNORMAL HIGH (ref 27.0–33.0)
MCHC: 33.2 g/dL (ref 32.0–36.0)
MCV: 101.1 fL — ABNORMAL HIGH (ref 80.0–100.0)
MPV: 10.5 fL (ref 7.5–12.5)
Monocytes Absolute: 780 cells/uL (ref 200–950)
Monocytes Relative: 10 %
Neutro Abs: 4914 cells/uL (ref 1500–7800)
Neutrophils Relative %: 63 %
Platelets: 263 10*3/uL (ref 140–400)
RBC: 4.47 MIL/uL (ref 3.80–5.10)
RDW: 13 % (ref 11.0–15.0)
WBC: 7.8 10*3/uL (ref 3.8–10.8)

## 2016-04-06 LAB — COMPLETE METABOLIC PANEL WITH GFR
ALT: 18 U/L (ref 6–29)
AST: 23 U/L (ref 10–35)
Albumin: 4.2 g/dL (ref 3.6–5.1)
Alkaline Phosphatase: 68 U/L (ref 33–130)
BUN: 15 mg/dL (ref 7–25)
CO2: 25 mmol/L (ref 20–31)
Calcium: 9.5 mg/dL (ref 8.6–10.4)
Chloride: 100 mmol/L (ref 98–110)
Creat: 0.64 mg/dL (ref 0.60–0.93)
GFR, Est African American: >89 mL/min (ref >=60)
GFR, Est Non African American: 89 mL/min (ref >=60)
Glucose, Bld: 98 mg/dL (ref 70–99)
Potassium: 4.2 mmol/L (ref 3.5–5.3)
Sodium: 138 mmol/L (ref 135–146)
Total Bilirubin: 0.5 mg/dL (ref 0.2–1.2)
Total Protein: 6.6 g/dL (ref 6.1–8.1)

## 2016-04-06 LAB — LIPID PANEL
Cholesterol: 194 mg/dL (ref 125–200)
HDL: 62 mg/dL (ref >=46)
LDL Cholesterol: 86 mg/dL (ref <130)
Total CHOL/HDL Ratio: 3.1 Ratio (ref <=5.0)
Triglycerides: 232 mg/dL — ABNORMAL HIGH (ref <150)
VLDL: 46 mg/dL — ABNORMAL HIGH (ref <30)

## 2016-04-06 LAB — TSH: TSH: 2.09 mIU/L

## 2016-04-06 NOTE — Progress Notes (Signed)
Subjective:    Patient ID: [name], female    DOB: 04-17-1941, 75 y.o.   MRN: [id number]  HPI Patient is here today for complete physical exam. She has had a shingles vaccine as well as Pneumovax 23. Therefore all her immunizations are up-to-date including those listed below. She does not require a Pap smear. She is on a schedule her appointment for her mammogram. She is overdue for a bone density test that she has not had one since 2004. She is also not sure the last time she had a colonoscopy. She believes it may have been 2007. Although asymptomatic, she would therefore then be due for repeat colonoscopy. Her last colonoscopy was performed by Dr. Benson Norway.  Her blood pressure today is significantly elevated. She believes this is a spurious finding and she states that it is much better controlled at home Immunization History  Administered Date(s) Administered  . Influenza,inj,Quad PF,36+ Mos 05/29/2014  . Influenza-Unspecified 06/23/2015  . Pneumococcal Conjugate-13 01/16/2014  . Td 09/12/1993  . Tdap 12/21/2012  last mammogram 8/16 Pap not required due to history of TAH/BSO   Past Medical History:  Diagnosis Date  . Allergic rhinitis   . Anemia   . Anxiety   . Arrhythmia   . Arthritis    "hands, neck, right shoulder" (07/28/2015)  . Aseptic necrosis ([place])   . Asthma dxc'd 05/2015  . Basal cell cancer   . Bleeding stomach ulcer ~ 06/2005  . COPD (chronic obstructive pulmonary disease) ([place])   . Depression   . Diverticula of colon   . Esophagitis   . Gastric ulcer with hemorrhage   . GERD (gastroesophageal reflux disease)   . Heart murmur   . History of blood transfusion 06/2005   "related to pneumonia"  . Hyperlipidemia   . Hypertension   . Hypothyroidism   . Kidney stones   . Lung cancer ([place])   . Mitral valve prolapse    "no ORs" (07/28/2015)  . Osteoporosis   . Pneumonia 1970's; 06/2005  . Sleep apnea dx'd 06/2015   "haven't had followup appt yet"  07/28/2015)  . Squamous cell skin cancer    Past Surgical History:  Procedure Laterality Date  . ABDOMINAL HYSTERECTOMY  1988  . HAMMER TOE SURGERY Right ~ 2010  . SALPINGOOPHORECTOMY Left 1988  . SKIN CANCER EXCISION     "I've had severl cut off RLE; left foreqrm, nose under nose"  . THORACOTOMY Left 06/2005  . THORACOTOMY/LOBECTOMY  09/2009; 11/2009   left; right   Current Outpatient Prescriptions on File Prior to Visit  Medication Sig Dispense Refill  . albuterol (PROVENTIL) (2.5 MG/3ML) 0.083% nebulizer solution Take 3 mLs (2.5 mg total) by nebulization every 4 (four) hours as needed for wheezing or shortness of breath. 450 mL 3  . ALPRAZolam (XANAX) 0.5 MG tablet TAKE 1 TABLET BY MOUTH EVERY DAY AT BEDTIME AS NEEDED ANXIETY OR INSOMNIA 90 tablet 0  . aspirin 81 MG tablet Take 81 mg by mouth every other day.     Marland Kitchen azithromycin (ZITHROMAX) 250 MG tablet Take 2 tablets x 1 day, then 1 tab daily for 4 days 6 tablet 0  . b complex-C-folic acid 1 MG capsule Take 1 capsule by mouth daily.      Marland Kitchen buPROPion (WELLBUTRIN XL) 150 MG 24 hr tablet TAKE 1 TABLET DAILY 90 tablet 3  . celecoxib (CELEBREX) 200 MG capsule TAKE 1 CAPSULE DAILY 90 capsule 2  . DULoxetine (CYMBALTA) 60 MG capsule  TAKE 1 CAPSULE DAILY 90 capsule 3  . fluticasone (FLONASE) 50 MCG/ACT nasal spray Place 2 sprays into both nostrils daily. 48 g 3  . Fluticasone-Salmeterol (ADVAIR DISKUS) 250-50 MCG/DOSE AEPB Inhale 1 puff into the lungs 2 (two) times daily. 3 each 3  . levothyroxine (SYNTHROID, LEVOTHROID) 100 MCG tablet TAKE 1 TABLET EVERY MORNING 90 tablet 3  . lisinopril-hydrochlorothiazide (PRINZIDE,ZESTORETIC) 10-12.5 MG per tablet TAKE 1 TABLET DAILY 90 tablet 3  . loratadine (CLARITIN) 10 MG tablet TAKE 1 TABLET DAILY 90 tablet 3  . mometasone (ELOCON) 0.1 % ointment Apply topically daily. 45 g 0  . nystatin (MYCOSTATIN) 100000 UNIT/ML suspension Take 5 mLs (500,000 Units total) by mouth 4 (four) times daily. Swish and  swallow. 60 mL 0  . oxyCODONE-acetaminophen (PERCOCET/ROXICET) 5-325 MG per tablet Take 1-2 tablets by mouth every 6 (six) hours as needed for severe pain. 20 tablet 0  . predniSONE (DELTASONE) 20 MG tablet Take 2 tablets (40 mg total) by mouth daily with breakfast. 10 tablet 0  . simvastatin (ZOCOR) 40 MG tablet Take 1 tablet (40 mg total) by mouth at bedtime. 90 tablet 3  . traZODone (DESYREL) 50 MG tablet Take 50 mg by mouth at bedtime as needed for sleep.     Marland Kitchen VITAMIN D, CHOLECALCIFEROL, PO Take 1 tablet by mouth daily.     No current facility-administered medications on file prior to visit.    Allergies  Allergen Reactions  . Doxycycline Nausea Only  . Erythromycin Nausea Only  . Sulfonamide Derivatives Hives   Social History   Social History  . Marital status: Married    Spouse name: N/A  . Number of children: 3  . Years of education: N/A   Occupational History  . retired Marine scientist Retired   Social History Main Topics  . Smoking status: Former Smoker    Packs/day: 0.00    Years: 43.00    Types: Cigarettes  . Smokeless tobacco: Never Used  . Alcohol use 4.2 oz/week    7 Shots of liquor per week  . Drug use: No  . Sexual activity: Yes   Other Topics Concern  . Not on file   Social History Narrative  . No narrative on file   Family History  Problem Relation Age of Onset  . Graves' disease Daughter   . Breast cancer Maternal Aunt   . Heart disease Maternal Aunt   . Hypertension Mother   . Arthritis Maternal Grandmother      Review of Systems  All other systems reviewed and are negative.      Objective:   Physical Exam  Constitutional: She is oriented to person, place, and time. She appears well-developed and well-nourished. No distress.  HENT:  Head: Normocephalic and atraumatic.  Right Ear: External ear normal.  Left Ear: External ear normal.  Nose: Nose normal.  Mouth/Throat: Oropharynx is clear and moist. No oropharyngeal exudate.  Eyes:  Conjunctivae and EOM are normal. Pupils are equal, round, and reactive to light. Right eye exhibits no discharge. Left eye exhibits no discharge. No scleral icterus.  Neck: Normal range of motion. Neck supple. No JVD present. No tracheal deviation present. No thyromegaly present.  Cardiovascular: Normal rate, regular rhythm, normal heart sounds and intact distal pulses.  Exam reveals no gallop and no friction rub.   No murmur heard. Pulmonary/Chest: Effort normal and breath sounds normal. No stridor. No respiratory distress. She has no wheezes. She has no rales. She exhibits no tenderness.  Abdominal: Soft.  Bowel sounds are normal. She exhibits no distension and no mass. There is no tenderness. There is no rebound and no guarding.  Musculoskeletal: Normal range of motion. She exhibits no edema, tenderness or deformity.  Lymphadenopathy:    She has no cervical adenopathy.  Neurological: She is alert and oriented to person, place, and time. She has normal reflexes. She displays normal reflexes. No cranial nerve deficit. She exhibits normal muscle tone. Coordination normal.  Skin: Skin is warm and dry. No rash noted. She is not diaphoretic. No erythema. No pallor.  Psychiatric: She has a normal mood and affect. Her behavior is normal. Judgment and thought content normal.  Vitals reviewed.         Assessment & Plan:  Routine general medical examination at a health care facility  COPD, moderate ([place])  Benign essential HTN  HLD (hyperlipidemia)  History of lung cancer Physical exam today is normal except for her blood pressure. She will check her blood pressure everyday for one week and notify me of the values. She will schedule her mammogram. I will schedule a bone density test. I will also contact GI and verify when her next colonoscopy needs to be performed. I will check a CBC, CMP, fasting lipid panel, and a TSH. With regards to her lung cancer, she is now 7 years diagnosis. She is  currently 6 years out from her surgery. She has been stable and is now being followed up annually with her oncologist. She has done remarkably well

## 2016-04-07 ENCOUNTER — Encounter: Payer: Self-pay | Admitting: Family Medicine

## 2016-04-12 ENCOUNTER — Telehealth: Payer: Self-pay | Admitting: Family Medicine

## 2016-04-12 DIAGNOSIS — Z1211 Encounter for screening for malignant neoplasm of colon: Secondary | ICD-10-CM

## 2016-04-12 NOTE — Telephone Encounter (Signed)
Pt called and states that her BP is still running high - average is 157/88 and today it was 165/100

## 2016-04-13 MED ORDER — LOSARTAN POTASSIUM-HCTZ 100-25 MG PO TABS: 1.0000 | ORAL_TABLET | Freq: Every day | ORAL | 3 refills | Status: DC

## 2016-04-13 MED ORDER — LOSARTAN POTASSIUM-HCTZ 100-25 MG PO TABS: 1.0000 | ORAL_TABLET | Freq: Every day | ORAL | 1 refills | Status: DC

## 2016-04-13 NOTE — Telephone Encounter (Signed)
Discontinue zestoretic and start hyzaar 100/25 poqday

## 2016-04-13 NOTE — Telephone Encounter (Signed)
Patient aware of providers recommendations. Medication called/sent to requested pharmacy

## 2016-04-20 ENCOUNTER — Other Ambulatory Visit: Payer: Self-pay | Admitting: Family Medicine

## 2016-04-20 NOTE — Telephone Encounter (Signed)
Refill appropriate and filled per protocol. 

## 2016-04-24 ENCOUNTER — Other Ambulatory Visit: Payer: Self-pay | Admitting: Family Medicine

## 2016-04-24 ENCOUNTER — Ambulatory Visit
Admission: RE | Admit: 2016-04-24 | Discharge: 2016-04-24 | Disposition: A | Discharge status: Home / Self Care | Payer: Medicare Other | Source: Ambulatory Visit | Attending: Family Medicine | Admitting: Family Medicine

## 2016-04-24 DIAGNOSIS — Z1231 Encounter for screening mammogram for malignant neoplasm of breast: Secondary | ICD-10-CM

## 2016-05-11 ENCOUNTER — Encounter: Payer: Self-pay | Admitting: Family Medicine

## 2016-05-11 ENCOUNTER — Ambulatory Visit (INDEPENDENT_AMBULATORY_CARE_PROVIDER_SITE_OTHER): Payer: Medicare Other | Admitting: Family Medicine

## 2016-05-11 VITALS — BP 118/64 | HR 88 | Temp 98.4°F | Resp 22 | Ht 67.5 in | Wt 170.0 lb

## 2016-05-11 DIAGNOSIS — I10 Essential (primary) hypertension: Secondary | ICD-10-CM | POA: Diagnosis not present

## 2016-05-11 NOTE — Progress Notes (Signed)
Subjective:    Patient ID: [name], female    DOB: [birth year], 75 y.o.   MRN: [id number]  HPI  04/06/16 Patient is here today for complete physical exam. She has had a shingles vaccine as well as Pneumovax 23. Therefore all her immunizations are up-to-date including those listed below. She does not require a Pap smear. She is on a schedule her appointment for her mammogram. She is overdue for a bone density test that she has not had one since 2004. She is also not sure the last time she had a colonoscopy. She believes it may have been 2007. Although asymptomatic, she would therefore then be due for repeat colonoscopy. Her last colonoscopy was performed by Dr. Benson Norway.  Her blood pressure today is significantly elevated. She believes this is a spurious finding and she states that it is much better controlled at home.  At that time, my plan was: Physical exam today is normal except for her blood pressure. She will check her blood pressure everyday for one week and notify me of the values. She will schedule her mammogram. I will schedule a bone density test. I will also contact GI and verify when her next colonoscopy needs to be performed. I will check a CBC, CMP, fasting lipid panel, and a TSH. With regards to her lung cancer, she is now 7 years diagnosis. She is currently 6 years out from her surgery. She has been stable and is now being followed up annually with her oncologist. She has done remarkably well  05/11/16 After the last visit, the patient was switched from zestoretic to hyzaar 100/25.  Here for follow up. Blood pressure has been averaging between 130 and 145/70-80 since changing the medication. She denies any chest pain or shortness of breath or dyspnea on exertion. Immunization History  Administered Date(s) Administered  . Influenza,inj,Quad PF,36+ Mos 05/29/2014  . Influenza-Unspecified 06/23/2015  . Pneumococcal Conjugate-13 01/16/2014  . Pneumococcal Polysaccharide-23 07/03/2012   . Td 09/12/1993  . Tdap 12/21/2012  . Zoster 09/27/2007   Past Medical History:  Diagnosis Date  . Allergic rhinitis   . Anemia   . Anxiety   . Arrhythmia   . Arthritis    "hands, neck, right shoulder" (07/28/2015)  . Aseptic necrosis ([place])   . Asthma dxc'd 05/2015  . Basal cell cancer   . Bleeding stomach ulcer ~ 06/2005  . COPD (chronic obstructive pulmonary disease) ([place])   . Depression   . Diverticula of colon   . Esophagitis   . Gastric ulcer with hemorrhage   . GERD (gastroesophageal reflux disease)   . Heart murmur   . History of blood transfusion 06/2005   "related to pneumonia"  . Hyperlipidemia   . Hypertension   . Hypothyroidism   . Kidney stones   . Lung cancer ([place])   . Mitral valve prolapse    "no ORs" (07/28/2015)  . Osteoporosis   . Pneumonia 1970's; 06/2005  . Sleep apnea dx'd 06/2015   "haven't had followup appt yet" 07/28/2015)  . Squamous cell skin cancer    Past Surgical History:  Procedure Laterality Date  . ABDOMINAL HYSTERECTOMY  1988  . HAMMER TOE SURGERY Right ~ 2010  . SALPINGOOPHORECTOMY Left 1988  . SKIN CANCER EXCISION     "I've had severl cut off RLE; left foreqrm, nose under nose"  . THORACOTOMY Left 06/2005  . THORACOTOMY/LOBECTOMY  09/2009; 11/2009   left; right   Current Outpatient Prescriptions on File Prior to Visit  Medication Sig Dispense Refill  . albuterol (PROVENTIL) (2.5 MG/3ML) 0.083% nebulizer solution Take 3 mLs (2.5 mg total) by nebulization every 4 (four) hours as needed for wheezing or shortness of breath. 450 mL 3  . ALPRAZolam (XANAX) 0.5 MG tablet TAKE 1 TABLET BY MOUTH EVERY DAY AT BEDTIME AS NEEDED ANXIETY OR INSOMNIA 90 tablet 0  . aspirin 81 MG tablet Take 81 mg by mouth every other day.     . b complex-C-folic acid 1 MG capsule Take 1 capsule by mouth daily.      Marland Kitchen buPROPion (WELLBUTRIN XL) 150 MG 24 hr tablet TAKE 1 TABLET DAILY 90 tablet 3  . celecoxib (CELEBREX) 200 MG capsule TAKE 1 CAPSULE DAILY 90  capsule 2  . DULoxetine (CYMBALTA) 60 MG capsule TAKE 1 CAPSULE DAILY 90 capsule 3  . fluticasone (FLONASE) 50 MCG/ACT nasal spray Place 2 sprays into both nostrils daily. 48 g 3  . Fluticasone-Salmeterol (ADVAIR DISKUS) 250-50 MCG/DOSE AEPB Inhale 1 puff into the lungs 2 (two) times daily. 3 each 3  . levothyroxine (SYNTHROID, LEVOTHROID) 100 MCG tablet TAKE 1 TABLET EVERY MORNING 90 tablet 2  . loratadine (CLARITIN) 10 MG tablet TAKE 1 TABLET DAILY 90 tablet 3  . losartan-hydrochlorothiazide (HYZAAR) 100-25 MG tablet Take 1 tablet by mouth daily. 90 tablet 3  . mometasone (ELOCON) 0.1 % ointment Apply topically daily. 45 g 0  . omeprazole (PRILOSEC) 20 MG capsule TAKE 1 CAPSULE DAILY 90 capsule 1  . oxyCODONE-acetaminophen (PERCOCET/ROXICET) 5-325 MG per tablet Take 1-2 tablets by mouth every 6 (six) hours as needed for severe pain. 20 tablet 0  . simvastatin (ZOCOR) 40 MG tablet Take 1 tablet (40 mg total) by mouth at bedtime. 90 tablet 3  . traZODone (DESYREL) 50 MG tablet Take 50 mg by mouth at bedtime as needed for sleep.     Marland Kitchen VITAMIN D, CHOLECALCIFEROL, PO Take 1 tablet by mouth daily.     No current facility-administered medications on file prior to visit.    Allergies  Allergen Reactions  . Doxycycline Nausea Only  . Erythromycin Nausea Only  . Sulfonamide Derivatives Hives   Social History   Social History  . Marital status: Married    Spouse name: N/A  . Number of children: 3  . Years of education: N/A   Occupational History  . retired Marine scientist Retired   Social History Main Topics  . Smoking status: Former Smoker    Packs/day: 0.00    Years: 43.00    Types: Cigarettes  . Smokeless tobacco: Never Used  . Alcohol use 4.2 oz/week    7 Shots of liquor per week  . Drug use: No  . Sexual activity: Yes   Other Topics Concern  . Not on file   Social History Narrative  . No narrative on file   Family History  Problem Relation Age of Onset  . Graves' disease  Daughter   . Breast cancer Maternal Aunt   . Heart disease Maternal Aunt   . Hypertension Mother   . Arthritis Maternal Grandmother      Review of Systems  All other systems reviewed and are negative.      Objective:   Physical Exam  Constitutional: She is oriented to person, place, and time. She appears well-developed and well-nourished. No distress.  HENT:  Head: Normocephalic and atraumatic.  Right Ear: External ear normal.  Left Ear: External ear normal.  Nose: Nose normal.  Mouth/Throat: Oropharynx is clear and moist. No  oropharyngeal exudate.  Eyes: Conjunctivae and EOM are normal. Pupils are equal, round, and reactive to light. Right eye exhibits no discharge. Left eye exhibits no discharge. No scleral icterus.  Neck: Normal range of motion. Neck supple. No JVD present. No tracheal deviation present. No thyromegaly present.  Cardiovascular: Normal rate, regular rhythm, normal heart sounds and intact distal pulses.  Exam reveals no gallop and no friction rub.   No murmur heard. Pulmonary/Chest: Effort normal and breath sounds normal. No stridor. No respiratory distress. She has no wheezes. She has no rales. She exhibits no tenderness.  Abdominal: Soft. Bowel sounds are normal. She exhibits no distension and no mass. There is no tenderness. There is no rebound and no guarding.  Musculoskeletal: Normal range of motion. She exhibits no edema, tenderness or deformity.  Lymphadenopathy:    She has no cervical adenopathy.  Neurological: She is alert and oriented to person, place, and time. She has normal reflexes. No cranial nerve deficit. She exhibits normal muscle tone. Coordination normal.  Skin: Skin is warm and dry. No rash noted. She is not diaphoretic. No erythema. No pallor.  Psychiatric: She has a normal mood and affect. Her behavior is normal. Judgment and thought content normal.  Vitals reviewed.         Assessment & Plan:  Blood pressure is still slightly  elevated however I would like the patient to try to loose 5-10 pounds and increase her aerobic exercise to see if we can get her blood pressure better controlled rather than add more medication. Recheck in 6 months

## 2016-05-28 ENCOUNTER — Other Ambulatory Visit: Payer: Self-pay | Admitting: Family Medicine

## 2016-05-28 DIAGNOSIS — J449 Chronic obstructive pulmonary disease, unspecified: Secondary | ICD-10-CM

## 2016-05-29 ENCOUNTER — Telehealth (HOSPITAL_COMMUNITY): Payer: Self-pay | Admitting: *Deleted

## 2016-05-29 ENCOUNTER — Ambulatory Visit
Admission: RE | Admit: 2016-05-29 | Discharge: 2016-05-29 | Disposition: A | Discharge status: Home / Self Care | Payer: Medicare Other | Source: Ambulatory Visit | Attending: Family Medicine | Admitting: Family Medicine

## 2016-05-29 DIAGNOSIS — M8589 Other specified disorders of bone density and structure, multiple sites: Secondary | ICD-10-CM | POA: Diagnosis not present

## 2016-05-29 DIAGNOSIS — Z0000 Encounter for general adult medical examination without abnormal findings: Secondary | ICD-10-CM

## 2016-05-29 DIAGNOSIS — Z780 Asymptomatic menopausal state: Secondary | ICD-10-CM | POA: Diagnosis not present

## 2016-05-29 DIAGNOSIS — E2839 Other primary ovarian failure: Secondary | ICD-10-CM

## 2016-05-30 ENCOUNTER — Ambulatory Visit (INDEPENDENT_AMBULATORY_CARE_PROVIDER_SITE_OTHER): Payer: Medicare Other

## 2016-05-30 ENCOUNTER — Encounter: Payer: Self-pay | Admitting: Family Medicine

## 2016-05-30 DIAGNOSIS — Z23 Encounter for immunization: Secondary | ICD-10-CM

## 2016-06-01 ENCOUNTER — Telehealth (HOSPITAL_COMMUNITY): Payer: Self-pay | Admitting: *Deleted

## 2016-06-02 ENCOUNTER — Telehealth: Payer: Self-pay | Admitting: Family Medicine

## 2016-06-02 DIAGNOSIS — Z1211 Encounter for screening for malignant neoplasm of colon: Secondary | ICD-10-CM | POA: Diagnosis not present

## 2016-06-02 DIAGNOSIS — J438 Other emphysema: Secondary | ICD-10-CM

## 2016-06-02 DIAGNOSIS — Z1212 Encounter for screening for malignant neoplasm of rectum: Secondary | ICD-10-CM | POA: Diagnosis not present

## 2016-06-02 NOTE — Telephone Encounter (Signed)
Patient and pulmonary rehab calling to see that referral is placed for this patient, for emphysema if any questions call joan at pulmonary rehab at [phone]

## 2016-06-03 NOTE — Telephone Encounter (Signed)
Referral placed.

## 2016-06-06 ENCOUNTER — Other Ambulatory Visit: Payer: Self-pay | Admitting: Family Medicine

## 2016-06-06 DIAGNOSIS — J441 Chronic obstructive pulmonary disease with (acute) exacerbation: Secondary | ICD-10-CM

## 2016-06-07 ENCOUNTER — Encounter: Payer: Self-pay | Admitting: Family Medicine

## 2016-06-13 ENCOUNTER — Other Ambulatory Visit: Payer: Self-pay | Admitting: Family Medicine

## 2016-06-13 DIAGNOSIS — J449 Chronic obstructive pulmonary disease, unspecified: Secondary | ICD-10-CM

## 2016-06-13 DIAGNOSIS — J9601 Acute respiratory failure with hypoxia: Secondary | ICD-10-CM

## 2016-06-22 ENCOUNTER — Other Ambulatory Visit: Payer: Self-pay | Admitting: Family Medicine

## 2016-06-22 DIAGNOSIS — J439 Emphysema, unspecified: Secondary | ICD-10-CM

## 2016-06-23 ENCOUNTER — Institutional Professional Consult (permissible substitution): Payer: Medicare Other | Admitting: Pulmonary Disease

## 2016-07-08 ENCOUNTER — Other Ambulatory Visit: Payer: Self-pay | Admitting: Family Medicine

## 2016-07-18 DIAGNOSIS — R918 Other nonspecific abnormal finding of lung field: Secondary | ICD-10-CM | POA: Diagnosis not present

## 2016-07-18 DIAGNOSIS — R0902 Hypoxemia: Secondary | ICD-10-CM | POA: Diagnosis not present

## 2016-07-18 DIAGNOSIS — C3411 Malignant neoplasm of upper lobe, right bronchus or lung: Secondary | ICD-10-CM | POA: Diagnosis not present

## 2016-07-18 DIAGNOSIS — Z9981 Dependence on supplemental oxygen: Secondary | ICD-10-CM | POA: Diagnosis not present

## 2016-07-18 DIAGNOSIS — R0600 Dyspnea, unspecified: Secondary | ICD-10-CM | POA: Diagnosis not present

## 2016-07-18 DIAGNOSIS — J9611 Chronic respiratory failure with hypoxia: Secondary | ICD-10-CM | POA: Diagnosis not present

## 2016-07-18 DIAGNOSIS — Z85118 Personal history of other malignant neoplasm of bronchus and lung: Secondary | ICD-10-CM | POA: Diagnosis not present

## 2016-07-18 DIAGNOSIS — R0602 Shortness of breath: Secondary | ICD-10-CM | POA: Diagnosis not present

## 2016-07-18 DIAGNOSIS — D751 Secondary polycythemia: Secondary | ICD-10-CM | POA: Diagnosis not present

## 2016-07-18 DIAGNOSIS — C3412 Malignant neoplasm of upper lobe, left bronchus or lung: Secondary | ICD-10-CM | POA: Diagnosis not present

## 2016-07-18 DIAGNOSIS — Z79899 Other long term (current) drug therapy: Secondary | ICD-10-CM | POA: Diagnosis not present

## 2016-07-18 DIAGNOSIS — Z902 Acquired absence of lung [part of]: Secondary | ICD-10-CM | POA: Diagnosis not present

## 2016-07-18 DIAGNOSIS — C3492 Malignant neoplasm of unspecified part of left bronchus or lung: Secondary | ICD-10-CM | POA: Diagnosis not present

## 2016-07-18 DIAGNOSIS — Z87891 Personal history of nicotine dependence: Secondary | ICD-10-CM | POA: Diagnosis not present

## 2016-07-18 DIAGNOSIS — Z08 Encounter for follow-up examination after completed treatment for malignant neoplasm: Secondary | ICD-10-CM | POA: Diagnosis not present

## 2016-07-28 ENCOUNTER — Encounter (HOSPITAL_COMMUNITY)
Admission: RE | Admit: 2016-07-28 | Discharge: 2016-07-28 | Disposition: A | Discharge status: Home / Self Care | Payer: Medicare Other | Source: Ambulatory Visit | Attending: Pulmonary Disease | Admitting: Pulmonary Disease

## 2016-07-28 ENCOUNTER — Encounter (HOSPITAL_COMMUNITY): Payer: Self-pay

## 2016-07-28 VITALS — BP 146/72 | HR 85 | Resp 18 | Ht 66.5 in | Wt 163.6 lb

## 2016-07-28 DIAGNOSIS — J439 Emphysema, unspecified: Secondary | ICD-10-CM | POA: Diagnosis not present

## 2016-07-28 NOTE — Progress Notes (Signed)
[name] 75 y.o. female Pulmonary Rehab Orientation Note Patient arrived today in [facility] for orientation to Pulmonary Rehab. She ambulated at a slow pace without difficulty from [employer]. She does not carry portable oxygen. Per pt, she uses oxygen at night when going to sleep. Color good, skin warm and dry. Patient is oriented to time and place. Patient's medical history, psychosocial health, and medications reviewed. Psychosocial assessment reveals pt lives with their spouse. He is also a patient in pulmonary rehab.Pt is currently retired from nursing. She retired from [employer], [employer] 2010 when she was diagnosed with lung cancer. Pt hobbies include knitting, reading, and traveling. Pt reports her stress level is moderate. Areas of stress/anxiety include worring about the health of her husband and worry about the drug dependency and addiction of her son who has recently moved back to [place] because he lost his job in [place].  Pt does not exhibit signs of depression. She states her medications keep her depression symptoms at [place]. PHQ2/9 score 0/na. Pt shows good  coping skills with positive outlook. She is offered emotional support and reassurance. Will continue to monitor and evaluate progress toward psychosocial goal(s) of monitoring her depression and utilizing resources for depression/anxiety as needed. She has seen a psychologist in the past and comfortable with reaching out for care if needed. Physical assessment reveals heart rate is normal. Breath sounds are diminished. Grip strength equal, strong. Distal pulses palpable. No edema noted. Patient reports she does take medications as prescribed. Patient states she follows a Regular diet. The patient reports no specific efforts to gain or lose weight. Patient's weight will be monitored closely. Demonstration and practice of PLB using pulse oximeter. Patient able to return demonstration satisfactorily. Safety and  hand hygiene in the exercise area reviewed with patient. Patient voices understanding of the information reviewed. Department expectations discussed with patient and achievable goals were set. The patient shows enthusiasm about attending the program and we look forward to working with this nice lady. The patient is scheduled for a 6 min walk test on 11/28 and to begin exercise on 12/5 in the 1030 class with her husband.   45 minutes was spent on a variety of activities such as assessment of the patient, obtaining baseline data including height, weight, BMI, and grip strength, verifying medical history, allergies, and current medications, and teaching patient strategies for performing tasks with less respiratory effort with emphasis on pursed lip breathing.

## 2016-07-31 ENCOUNTER — Other Ambulatory Visit: Payer: Self-pay | Admitting: Family Medicine

## 2016-08-07 ENCOUNTER — Encounter: Payer: Self-pay | Admitting: Family Medicine

## 2016-08-07 ENCOUNTER — Ambulatory Visit (INDEPENDENT_AMBULATORY_CARE_PROVIDER_SITE_OTHER): Payer: Medicare Other | Admitting: Family Medicine

## 2016-08-07 VITALS — BP 150/84 | HR 72 | Temp 98.9°F | Resp 20 | Ht 67.5 in | Wt 164.0 lb

## 2016-08-07 DIAGNOSIS — E7800 Pure hypercholesterolemia, unspecified: Secondary | ICD-10-CM | POA: Diagnosis not present

## 2016-08-07 DIAGNOSIS — I10 Essential (primary) hypertension: Secondary | ICD-10-CM | POA: Diagnosis not present

## 2016-08-07 MED ORDER — AMLODIPINE BESYLATE 10 MG PO TABS: 10.0000 mg | ORAL_TABLET | Freq: Every day | ORAL | 3 refills | Status: DC

## 2016-08-07 MED ORDER — PRAVASTATIN SODIUM 40 MG PO TABS: 40.0000 mg | ORAL_TABLET | Freq: Every day | ORAL | 3 refills | Status: DC

## 2016-08-07 NOTE — Progress Notes (Signed)
Subjective:    Patient ID: [name], female    DOB: 03/15/1941, 75 y.o.   MRN: [id number]  Hypertension     04/06/16 Patient is here today for complete physical exam. She has had a shingles vaccine as well as Pneumovax 23. Therefore all her immunizations are up-to-date including those listed below. She does not require a Pap smear. She is on a schedule her appointment for her mammogram. She is overdue for a bone density test that she has not had one since 2004. She is also not sure the last time she had a colonoscopy. She believes it may have been 2007. Although asymptomatic, she would therefore then be due for repeat colonoscopy. Her last colonoscopy was performed by Dr. Benson Norway.  Her blood pressure today is significantly elevated. She believes this is a spurious finding and she states that it is much better controlled at home.  At that time, my plan was: Physical exam today is normal except for her blood pressure. She will check her blood pressure everyday for one week and notify me of the values. She will schedule her mammogram. I will schedule a bone density test. I will also contact GI and verify when her next colonoscopy needs to be performed. I will check a CBC, CMP, fasting lipid panel, and a TSH. With regards to her lung cancer, she is now 7 years diagnosis. She is currently 6 years out from her surgery. She has been stable and is now being followed up annually with her oncologist. She has done remarkably well  05/11/16 After the last visit, the patient was switched from zestoretic to hyzaar 100/25.  Here for follow up. Blood pressure has been averaging between 130 and 145/70-80 since changing the medication. She denies any chest pain or shortness of breath or dyspnea on exertion.  At that time, my plan was: Blood pressure is still slightly elevated however I would like the patient to try to loose 5-10 pounds and increase her aerobic exercise to see if we can get her blood pressure better  controlled rather than add more medication. Recheck in 6 months   08/07/16 Patient has lost approximate 5 or 6 pounds. She is exercising. However her blood pressures continue to run 703-500 systolic over 93-81. She has been unable to achieve the additional decline in blood pressure readings that we were hoping for. Immunization History  Administered Date(s) Administered  . Influenza,inj,Quad PF,36+ Mos 05/29/2014, 05/30/2016  . Influenza-Unspecified 06/23/2015  . Pneumococcal Conjugate-13 01/16/2014  . Pneumococcal Polysaccharide-23 07/03/2012  . Td 09/12/1993  . Tdap 12/21/2012  . Zoster 09/27/2007   Past Medical History:  Diagnosis Date  . Allergic rhinitis   . Anemia   . Anxiety   . Arrhythmia   . Arthritis    "hands, neck, right shoulder" (07/28/2015)  . Aseptic necrosis ([place])   . Asthma dxc'd 05/2015  . Basal cell cancer   . Bleeding stomach ulcer ~ 06/2005  . COPD (chronic obstructive pulmonary disease) ([place])   . Depression   . Diverticula of colon   . Esophagitis   . Gastric ulcer with hemorrhage   . GERD (gastroesophageal reflux disease)   . Heart murmur   . History of blood transfusion 06/2005   "related to pneumonia"  . Hyperlipidemia   . Hypertension   . Hypothyroidism   . Kidney stones   . Lung cancer ([place])   . Mitral valve prolapse    "no ORs" (07/28/2015)  . Osteopenia   .  Pneumonia 1970's; 06/2005  . Sleep apnea dx'd 06/2015   "haven't had followup appt yet" 07/28/2015)  . Squamous cell skin cancer    Past Surgical History:  Procedure Laterality Date  . ABDOMINAL HYSTERECTOMY  1988  . HAMMER TOE SURGERY Right ~ 2010  . SALPINGOOPHORECTOMY Left 1988  . SKIN CANCER EXCISION     "I've had severl cut off RLE; left foreqrm, nose under nose"  . THORACOTOMY Left 06/2005  . THORACOTOMY/LOBECTOMY  09/2009; 11/2009   left; right   Current Outpatient Prescriptions on File Prior to Visit  Medication Sig Dispense Refill  . acetaminophen (TYLENOL) 325 MG  tablet Take by mouth.    . ADVAIR DISKUS 250-50 MCG/DOSE AEPB USE 1 INHALATION TWICE A DAY 180 each 3  . albuterol (PROVENTIL) (2.5 MG/3ML) 0.083% nebulizer solution Take 3 mLs (2.5 mg total) by nebulization every 4 (four) hours as needed for wheezing or shortness of breath. 450 mL 3  . ALPRAZolam (XANAX) 0.5 MG tablet TAKE 1 TABLET BY MOUTH EVERY DAY AT BEDTIME AS NEEDED ANXIETY OR INSOMNIA 90 tablet 0  . aspirin 81 MG tablet Take 81 mg by mouth every other day.     . b complex-C-folic acid 1 MG capsule Take 1 capsule by mouth daily.      Marland Kitchen buPROPion (WELLBUTRIN XL) 150 MG 24 hr tablet TAKE 1 TABLET DAILY 90 tablet 3  . celecoxib (CELEBREX) 200 MG capsule TAKE 1 CAPSULE DAILY 90 capsule 2  . Cyanocobalamin 1000 MCG CAPS Take by mouth.    . diphenhydrAMINE (BENADRYL) 50 MG capsule Take 50 mg by mouth at bedtime as needed for sleep.    . DULoxetine (CYMBALTA) 60 MG capsule TAKE 1 CAPSULE DAILY 90 capsule 3  . levothyroxine (SYNTHROID, LEVOTHROID) 100 MCG tablet TAKE 1 TABLET EVERY MORNING 90 tablet 2  . losartan-hydrochlorothiazide (HYZAAR) 100-25 MG tablet Take by mouth.    Marland Kitchen omeprazole (PRILOSEC) 20 MG capsule TAKE 1 CAPSULE DAILY 90 capsule 1  . simvastatin (ZOCOR) 40 MG tablet Take 1 tablet (40 mg total) by mouth at bedtime. 90 tablet 3  . traZODone (DESYREL) 50 MG tablet Take 50 mg by mouth at bedtime as needed for sleep.     Marland Kitchen VITAMIN D, CHOLECALCIFEROL, PO Take 1 tablet by mouth daily.    . fluticasone (FLONASE) 50 MCG/ACT nasal spray Place 2 sprays into both nostrils daily. (Patient not taking: Reported on 08/07/2016) 48 g 3  . loratadine (CLARITIN) 10 MG tablet TAKE 1 TABLET DAILY (Patient not taking: Reported on 08/07/2016) 90 tablet 3   No current facility-administered medications on file prior to visit.    Allergies  Allergen Reactions  . Doxycycline Nausea Only  . Erythromycin Nausea Only  . Sulfonamide Derivatives Hives   Social History   Social History  . Marital status:  Married    Spouse name: N/A  . Number of children: 3  . Years of education: N/A   Occupational History  . retired Marine scientist Retired   Social History Main Topics  . Smoking status: Former Smoker    Packs/day: 0.00    Years: 43.00    Types: Cigarettes  . Smokeless tobacco: Never Used  . Alcohol use 4.2 oz/week    7 Shots of liquor per week  . Drug use: No  . Sexual activity: Yes   Other Topics Concern  . Not on file   Social History Narrative  . No narrative on file   Family History  Problem Relation Age  of Onset  . Graves' disease Daughter   . Breast cancer Maternal Aunt   . Heart disease Maternal Aunt   . Hypertension Mother   . Arthritis Maternal Grandmother      Review of Systems  All other systems reviewed and are negative.      Objective:   Physical Exam  Constitutional: She appears well-developed and well-nourished. No distress.  Neck: No JVD present.  Cardiovascular: Normal rate, regular rhythm, normal heart sounds and intact distal pulses.  Exam reveals no gallop and no friction rub.   No murmur heard. Pulmonary/Chest: Effort normal and breath sounds normal. No respiratory distress. She has no wheezes. She has no rales. She exhibits no tenderness.  Abdominal: Soft. Bowel sounds are normal. She exhibits no distension and no mass. There is no tenderness. There is no rebound and no guarding.  Musculoskeletal: She exhibits no edema.  Skin: She is not diaphoretic.  Vitals reviewed.         Assessment & Plan:  Benign essential HTN - Plan: amLODipine (NORVASC) 10 MG tablet  Pure hypercholesterolemia - Plan: pravastatin (PRAVACHOL) 40 MG tablet  Add amlodipine 10 mg by mouth daily to achieve additional reduction in blood pressure. Discontinue simvastatin due to the interaction with amlodipine and replaced with pravastatin 40 mg by mouth daily. Recheck blood pressures in one month.

## 2016-08-08 ENCOUNTER — Encounter (HOSPITAL_COMMUNITY)
Admission: RE | Admit: 2016-08-08 | Payer: Medicare Other | Source: Ambulatory Visit | Attending: Pulmonary Disease | Admitting: Pulmonary Disease

## 2016-08-10 ENCOUNTER — Encounter (HOSPITAL_COMMUNITY)
Admission: RE | Admit: 2016-08-10 | Discharge: 2016-08-10 | Disposition: A | Discharge status: Home / Self Care | Payer: Medicare Other | Source: Ambulatory Visit | Attending: Pulmonary Disease | Admitting: Pulmonary Disease

## 2016-08-10 DIAGNOSIS — J439 Emphysema, unspecified: Secondary | ICD-10-CM

## 2016-08-11 NOTE — Progress Notes (Signed)
Pulmonary Individual Treatment Plan  Patient Details  Name: [name] MRN: [id number] Date of Birth: 07/05/1941 Referring Provider:   Flowsheet Row Pulmonary Rehab Walk Test from 08/10/2016 in [facility]  Referring Provider  Dr. Yacoub      Initial Encounter Date:  Flowsheet Row Pulmonary Rehab Walk Test from 08/10/2016 in [facility]  Date  08/11/16  Referring Provider  Dr. Yacoub      Visit Diagnosis: Pulmonary emphysema, unspecified emphysema type (HCC)  Patient's Home Medications on Admission:   Current Outpatient Prescriptions:  .  acetaminophen (TYLENOL) 325 MG tablet, Take by mouth., Disp: , Rfl:  .  ADVAIR DISKUS 250-50 MCG/DOSE AEPB, USE 1 INHALATION TWICE A DAY, Disp: 180 each, Rfl: 3 .  albuterol (PROVENTIL) (2.5 MG/3ML) 0.083% nebulizer solution, Take 3 mLs (2.5 mg total) by nebulization every 4 (four) hours as needed for wheezing or shortness of breath., Disp: 450 mL, Rfl: 3 .  ALPRAZolam (XANAX) 0.5 MG tablet, TAKE 1 TABLET BY MOUTH EVERY DAY AT BEDTIME AS NEEDED ANXIETY OR INSOMNIA, Disp: 90 tablet, Rfl: 0 .  amLODipine (NORVASC) 10 MG tablet, Take 1 tablet (10 mg total) by mouth daily., Disp: 90 tablet, Rfl: 3 .  aspirin 81 MG tablet, Take 81 mg by mouth every other day. , Disp: , Rfl:  .  b complex-C-folic acid 1 MG capsule, Take 1 capsule by mouth daily.  , Disp: , Rfl:  .  buPROPion (WELLBUTRIN XL) 150 MG 24 hr tablet, TAKE 1 TABLET DAILY, Disp: 90 tablet, Rfl: 3 .  Calcium Carbonate-Vit D-Min (CALCIUM 1200 PO), Take by mouth., Disp: , Rfl:  .  celecoxib (CELEBREX) 200 MG capsule, TAKE 1 CAPSULE DAILY, Disp: 90 capsule, Rfl: 2 .  Cyanocobalamin 1000 MCG CAPS, Take by mouth., Disp: , Rfl:  .  diphenhydrAMINE (BENADRYL) 50 MG capsule, Take 50 mg by mouth at bedtime as needed for sleep., Disp: , Rfl:  .  DULoxetine (CYMBALTA) 60 MG capsule, TAKE 1 CAPSULE DAILY, Disp: 90 capsule, Rfl: 3 .   fluticasone (FLONASE) 50 MCG/ACT nasal spray, Place 2 sprays into both nostrils daily. (Patient not taking: Reported on 08/07/2016), Disp: 48 g, Rfl: 3 .  levothyroxine (SYNTHROID, LEVOTHROID) 100 MCG tablet, TAKE 1 TABLET EVERY MORNING, Disp: 90 tablet, Rfl: 2 .  loratadine (CLARITIN) 10 MG tablet, TAKE 1 TABLET DAILY (Patient not taking: Reported on 08/07/2016), Disp: 90 tablet, Rfl: 3 .  losartan-hydrochlorothiazide (HYZAAR) 100-25 MG tablet, Take by mouth., Disp: , Rfl:  .  omeprazole (PRILOSEC) 20 MG capsule, TAKE 1 CAPSULE DAILY, Disp: 90 capsule, Rfl: 1 .  pravastatin (PRAVACHOL) 40 MG tablet, Take 1 tablet (40 mg total) by mouth daily. Stop simvastatin, Disp: 90 tablet, Rfl: 3 .  simvastatin (ZOCOR) 40 MG tablet, Take 1 tablet (40 mg total) by mouth at bedtime., Disp: 90 tablet, Rfl: 3 .  traZODone (DESYREL) 50 MG tablet, Take 50 mg by mouth at bedtime as needed for sleep. , Disp: , Rfl:  .  VITAMIN D, CHOLECALCIFEROL, PO, Take 1 tablet by mouth daily., Disp: , Rfl:   Past Medical History: Past Medical History:  Diagnosis Date  . Allergic rhinitis   . Anemia   . Anxiety   . Arrhythmia   . Arthritis    "hands, neck, right shoulder" (07/28/2015)  . Aseptic necrosis (HCC)   . Asthma dxc'd 05/2015  . Basal cell cancer   . Bleeding stomach ulcer ~ 06/2005  .   COPD (chronic obstructive pulmonary disease) ([place])   . Depression   . Diverticula of colon   . Esophagitis   . Gastric ulcer with hemorrhage   . GERD (gastroesophageal reflux disease)   . Heart murmur   . History of blood transfusion 06/2005   "related to pneumonia"  . Hyperlipidemia   . Hypertension   . Hypothyroidism   . Kidney stones   . Lung cancer ([place])   . Mitral valve prolapse    "no ORs" (07/28/2015)  . Osteopenia   . Pneumonia 1970's; 06/2005  . Sleep apnea dx'd 06/2015   "haven't had followup appt yet" 07/28/2015)  . Squamous cell skin cancer     Tobacco Use: History  Smoking Status  . Former Smoker   . Packs/day: 0.00  . Years: 43.00  . Types: Cigarettes  Smokeless Tobacco  . Never Used    Labs: Recent Review Flowsheet Data    Labs for ITP Cardiac and Pulmonary Rehab Latest Ref Rng & Units 12/06/2009 12/27/2009 12/29/2009 01/16/2014 04/06/2016   Cholestrol 125 - 200 mg/dL - - - 184 194   LDLCALC <130 mg/dL - - - 97 86   HDL >=46 mg/dL - - - 58 62   Trlycerides <150 mg/dL - - - 143 232(H)   PHART 7.350 - 7.400 - 7.438(H) 7.390 - -   PCO2ART 35.0 - 45.0 mmHg - 37.1 40.9 - -   HCO3 20.0 - 24.0 mEq/L - 24.7(H) 24.7(H) - -   TCO2 0 - 100 mmol/L 27 25.8 26 - -   ACIDBASEDEF 0.0 - 2.0 mmol/L - - - - -   O2SAT % - 82.2 89.0 - -      Capillary Blood Glucose: Lab Results  Component Value Date   GLUCAP 106 (H) 01/02/2010   GLUCAP 124 (H) 01/01/2010   GLUCAP 132 (H) 01/01/2010   GLUCAP 126 (H) 01/01/2010   GLUCAP 114 (H) 01/01/2010     ADL UCSD:   Pulmonary Function Assessment:     Pulmonary Function Assessment - 07/28/16 1423      Breath   Bilateral Breath Sounds Clear   Shortness of Breath Yes;Limiting activity      Exercise Target Goals: Date: 08/11/16  Exercise Program Goal: Individual exercise prescription set with THRR, safety & activity barriers. Participant demonstrates ability to understand and report RPE using BORG scale, to self-measure pulse accurately, and to acknowledge the importance of the exercise prescription.  Exercise Prescription Goal: Starting with aerobic activity 30 plus minutes a day, 3 days per week for initial exercise prescription. Provide home exercise prescription and guidelines that participant acknowledges understanding prior to discharge.  Activity Barriers & Risk Stratification:     Activity Barriers & Cardiac Risk Stratification - 07/28/16 1421      Activity Barriers & Cardiac Risk Stratification   Activity Barriers Deconditioning;Shortness of Breath      6 Minute Walk:     6 Minute Walk    Row Name 08/11/16 0838          6 Minute Walk   Phase Initial     Distance 1563 feet     Walk Time 6 minutes     # of Rest Breaks 0     MPH 2.96     METS 2.2     RPE 14     Perceived Dyspnea  3     Symptoms No     Resting HR 87 bpm     Resting BP 150/81  Max Ex. HR 108 bpm     Max Ex. BP 198/80  2 days into new BP medicine     2 Minute Post BP 150/74       Interval HR   Baseline HR 87     1 Minute HR 91     2 Minute HR 108     3 Minute HR 108     4 Minute HR 95     5 Minute HR 107     6 Minute HR 104     2 Minute Post HR 93     Interval Heart Rate? Yes       Interval Oxygen   Interval Oxygen? Yes     Baseline Oxygen Saturation % 96 %     Baseline Liters of Oxygen 0 L     1 Minute Oxygen Saturation % 93 %     1 Minute Liters of Oxygen 0 L     2 Minute Oxygen Saturation % 96 %     2 Minute Liters of Oxygen 0 L     3 Minute Oxygen Saturation % 96 %     3 Minute Liters of Oxygen 0 L     4 Minute Oxygen Saturation % 93 %     4 Minute Liters of Oxygen 0 L     5 Minute Oxygen Saturation % 92 %     5 Minute Liters of Oxygen 0 L     6 Minute Oxygen Saturation % 90 %     6 Minute Liters of Oxygen 0 L     2 Minute Post Oxygen Saturation % 93 %     2 Minute Post Liters of Oxygen 0 L        Initial Exercise Prescription:     Initial Exercise Prescription - 08/11/16 0800      Date of Initial Exercise RX and Referring Provider   Date 08/11/16   Referring Provider Dr. Nelda Marseille     NuStep   Level 2   Minutes 17   METs 1.7     Rower   Level 2   Minutes 17     Track   Laps 10   Minutes 17     Prescription Details   Frequency (times per week) 2   Duration Progress to 45 minutes of aerobic exercise without signs/symptoms of physical distress     Intensity   THRR 40-80% of Max Heartrate 58-117   Ratings of Perceived Exertion 11-13   Perceived Dyspnea 0-4     Progression   Progression Continue progressive overload as per policy without signs/symptoms or physical distress.      Resistance Training   Training Prescription Yes   Weight blue bands   Reps 10-12      Perform Capillary Blood Glucose checks as needed.  Exercise Prescription Changes:   Exercise Comments:   Discharge Exercise Prescription (Final Exercise Prescription Changes):    Nutrition:  Target Goals: Understanding of nutrition guidelines, daily intake of sodium '1500mg'$ , cholesterol '200mg'$ , calories 30% from fat and 7% or less from saturated fats, daily to have 5 or more servings of fruits and vegetables.  Biometrics:    Nutrition Therapy Plan and Nutrition Goals:   Nutrition Discharge: Rate Your Plate Scores:   Psychosocial: Target Goals: Acknowledge presence or absence of depression, maximize coping skills, provide positive support system. Participant is able to verbalize types and ability to use techniques and skills needed for reducing stress and depression.  Initial Review & Psychosocial Screening:     Initial Psych Review & Screening - 07/28/16 1425      Initial Review   Current issues with History of Depression;Current Stress Concerns   Comments chronic illness of husban     Family Dynamics   Good Support System? Yes     Barriers   Psychosocial barriers to participate in program Psychosocial barriers identified (see note)     Screening Interventions   Interventions Encouraged to exercise      Quality of Life Scores:   PHQ-9: Recent Review Flowsheet Data    Depression screen [facility] 2/9 07/28/2016 07/26/2015 12/01/2013   Decreased Interest 0 0 0   Down, Depressed, Hopeless 0 0 0   PHQ - 2 Score 0 0 0   Altered sleeping - 0 -   Tired, decreased energy - 0 -   Change in appetite - 0 -   Feeling bad or failure about yourself  - 0 -   Trouble concentrating - 0 -   Moving slowly or fidgety/restless - 0 -   Suicidal thoughts - 0 -   PHQ-9 Score - 0 -   Difficult doing work/chores - Not difficult at all -      Psychosocial Evaluation and Intervention:      Psychosocial Evaluation - 07/28/16 1428      Psychosocial Evaluation & Interventions   Interventions Encouraged to exercise with the program and follow exercise prescription      Psychosocial Re-Evaluation:  Education: Education Goals: Education classes will be provided on a weekly basis, covering required topics. Participant will state understanding/return demonstration of topics presented.  Learning Barriers/Preferences:     Learning Barriers/Preferences - 07/28/16 1422      Learning Barriers/Preferences   Learning Preferences Group Instruction;Written Material;Individual Instruction      Education Topics: Risk Factor Reduction:  -Group instruction that is supported by a PowerPoint presentation. Instructor discusses the definition of a risk factor, different risk factors for pulmonary disease, and how the heart and lungs work together.     Nutrition for Pulmonary Patient:  -Group instruction provided by PowerPoint slides, verbal discussion, and written materials to support subject matter. The instructor gives an explanation and review of healthy diet recommendations, which includes a discussion on weight management, recommendations for fruit and vegetable consumption, as well as protein, fluid, caffeine, fiber, sodium, sugar, and alcohol. Tips for eating when patients are short of breath are discussed.   Pursed Lip Breathing:  -Group instruction that is supported by demonstration and informational handouts. Instructor discusses the benefits of pursed lip and diaphragmatic breathing and detailed demonstration on how to preform both.     Oxygen Safety:  -Group instruction provided by PowerPoint, verbal discussion, and written material to support subject matter. There is an overview of "What is Oxygen" and "Why do we need it".  Instructor also reviews how to create a safe environment for oxygen use, the importance of using oxygen as prescribed, and the risks of noncompliance.  There is a brief discussion on traveling with oxygen and resources the patient may utilize.   Oxygen Equipment:  -Group instruction provided by [facility] Staff utilizing handouts, written materials, and equipment demonstrations.   Signs and Symptoms:  -Group instruction provided by written material and verbal discussion to support subject matter. Warning signs and symptoms of infection, stroke, and heart attack are reviewed and when to call the physician/911 reinforced. Tips for preventing the spread of infection discussed.   Advanced Directives:  -  Group instruction provided by verbal instruction and written material to support subject matter. Instructor reviews Advanced Directive laws and proper instruction for filling out document.   Pulmonary Video:  -Group video education that reviews the importance of medication and oxygen compliance, exercise, good nutrition, pulmonary hygiene, and pursed lip and diaphragmatic breathing for the pulmonary patient.   Exercise for the Pulmonary Patient:  -Group instruction that is supported by a PowerPoint presentation. Instructor discusses benefits of exercise, core components of exercise, frequency, duration, and intensity of an exercise routine, importance of utilizing pulse oximetry during exercise, safety while exercising, and options of places to exercise outside of rehab.     Pulmonary Medications:  -Verbally interactive group education provided by instructor with focus on inhaled medications and proper administration.   Anatomy and Physiology of the Respiratory System and Intimacy:  -Group instruction provided by PowerPoint, verbal discussion, and written material to support subject matter. Instructor reviews respiratory cycle and anatomical components of the respiratory system and their functions. Instructor also reviews differences in obstructive and restrictive respiratory diseases with examples of each. Intimacy, Sex, and Sexuality  differences are reviewed with a discussion on how relationships can change when diagnosed with pulmonary disease. Common sexual concerns are reviewed.   Knowledge Questionnaire Score:   Core Components/Risk Factors/Patient Goals at Admission:     Personal Goals and Risk Factors at Admission - 07/28/16 1424      Core Components/Risk Factors/Patient Goals on Admission   Improve shortness of breath with ADL's Yes   Intervention Provide education, individualized exercise plan and daily activity instruction to help decrease symptoms of SOB with activities of daily living.   Expected Outcomes Short Term: Achieves a reduction of symptoms when performing activities of daily living.   Hypertension Yes   Intervention Provide education on lifestyle modifcations including regular physical activity/exercise, weight management, moderate sodium restriction and increased consumption of fresh fruit, vegetables, and low fat dairy, alcohol moderation, and smoking cessation.;Monitor prescription use compliance.   Expected Outcomes Short Term: Continued assessment and intervention until BP is < 140/51m HG in hypertensive participants. < 130/868mHG in hypertensive participants with diabetes, heart failure or chronic kidney disease.;Long Term: Maintenance of blood pressure at goal levels.      Core Components/Risk Factors/Patient Goals Review:    Core Components/Risk Factors/Patient Goals at Discharge (Final Review):    ITP Comments:   Comments:

## 2016-08-15 ENCOUNTER — Encounter (HOSPITAL_COMMUNITY)
Admission: RE | Admit: 2016-08-15 | Discharge: 2016-08-15 | Disposition: A | Discharge status: Home / Self Care | Payer: Medicare Other | Source: Ambulatory Visit | Attending: Pulmonary Disease | Admitting: Pulmonary Disease

## 2016-08-15 VITALS — Wt 157.2 lb

## 2016-08-15 DIAGNOSIS — J439 Emphysema, unspecified: Secondary | ICD-10-CM

## 2016-08-15 NOTE — Progress Notes (Signed)
Daily Session Note  Patient Details  Name: GLORIOUS FLICKER MRN: [id number] Date of Birth: [birth year] Referring Provider:   April Manson Pulmonary Rehab Walk Test from 08/10/2016 in [place]  Referring Provider  Dr. Nelda Marseille      Encounter Date: 08/15/2016  Check In:     Session Check In - 08/15/16 1024      Check-In   Location [facility]   Staff Present Trish Fountain, RN, Maxcine Ham, RN, BSN;Molly diVincenzo, MS, ACSM RCEP, Exercise Physiologist;Lisa Ysidro Evert, RN   Supervising physician immediately available to respond to emergencies [facility] immediately available   Physician(s) Dr. Ree Kida   Medication changes reported     No   Fall or balance concerns reported    No   Warm-up and Cool-down Performed as group-led instruction   Resistance Training Performed Yes   VAD Patient? No     Pain Assessment   Currently in Pain? No/denies   Multiple Pain Sites No      Capillary Blood Glucose: No results found for this or any previous visit (from the past 24 hour(s)).      Exercise Prescription Changes - 08/15/16 1200      Response to Exercise   Blood Pressure (Admit) 112/60   Blood Pressure (Exercise) 126/70   Blood Pressure (Exit) 102/50   Heart Rate (Admit) 79 bpm   Heart Rate (Exercise) 94 bpm   Heart Rate (Exit) 84 bpm   Oxygen Saturation (Admit) 94 %   Oxygen Saturation (Exercise) 95 %   Oxygen Saturation (Exit) 95 %   Rating of Perceived Exertion (Exercise) 13   Perceived Dyspnea (Exercise) 1   Duration Progress to 45 minutes of aerobic exercise without signs/symptoms of physical distress   Intensity --  40-80% HRR     Progression   Progression Continue to progress workloads to maintain intensity without signs/symptoms of physical distress.     Resistance Training   Training Prescription Yes   Weight blue bands   Reps 10-12  10 minutes of strength training     Interval Training   Interval  Training No     NuStep   Level 3   Minutes 17   METs 2.4     Rower   Level 1   Minutes 17     Track   Laps 11   Minutes 17     Goals Met:  Exercise tolerated well Strength training completed today  Goals Unmet:  Not Applicable  Comments: Service time is from 1030 to 1200    Dr. Rush Farmer is Medical Director for Pulmonary Rehab at [facility].

## 2016-08-17 ENCOUNTER — Encounter (HOSPITAL_COMMUNITY)
Admission: RE | Admit: 2016-08-17 | Discharge: 2016-08-17 | Disposition: A | Discharge status: Home / Self Care | Payer: Medicare Other | Source: Ambulatory Visit | Attending: Pulmonary Disease | Admitting: Pulmonary Disease

## 2016-08-17 VITALS — Wt 162.0 lb

## 2016-08-17 DIAGNOSIS — J439 Emphysema, unspecified: Secondary | ICD-10-CM | POA: Diagnosis not present

## 2016-08-17 NOTE — Progress Notes (Signed)
Daily Session Note  Patient Details  Name: [name] MRN: [id number] Date of Birth: [birth year] Referring Provider:   April Manson Pulmonary Rehab Walk Test from 08/10/2016 in [place]  Referring Provider  Dr. Nelda Marseille      Encounter Date: 08/17/2016  Check In:     Session Check In - 08/17/16 1049      Check-In   Location [facility]   Staff Present Trish Fountain, RN, Maxcine Ham, RN, BSN;Molly diVincenzo, MS, ACSM RCEP, Exercise Physiologist;Daylene Vandenbosch Ysidro Evert, RN   Supervising physician immediately available to respond to emergencies [facility] immediately available   Physician(s) Dr. Allyson Sabal   Medication changes reported     No   Fall or balance concerns reported    No   Warm-up and Cool-down Performed as group-led instruction   Resistance Training Performed Yes   VAD Patient? No     Pain Assessment   Currently in Pain? No/denies   Multiple Pain Sites No      Capillary Blood Glucose: No results found for this or any previous visit (from the past 24 hour(s)).      Exercise Prescription Changes - 08/17/16 1200      Response to Exercise   Blood Pressure (Admit) 114/64   Blood Pressure (Exercise) 140/82   Blood Pressure (Exit) 116/60   Heart Rate (Admit) 81 bpm   Heart Rate (Exercise) 92 bpm   Heart Rate (Exit) 81 bpm   Oxygen Saturation (Admit) 97 %   Oxygen Saturation (Exercise) 95 %   Oxygen Saturation (Exit) 97 %   Rating of Perceived Exertion (Exercise) 12   Perceived Dyspnea (Exercise) 1   Duration Progress to 45 minutes of aerobic exercise without signs/symptoms of physical distress   Intensity THRR unchanged     Progression   Progression Continue to progress workloads to maintain intensity without signs/symptoms of physical distress.     Resistance Training   Training Prescription Yes   Weight blue bands   Reps 10-12  10 mi utes of strength training     Interval Training   Interval  Training No     Rower   Level 2   Minutes 17     Track   Laps 8   Minutes 17     Goals Met:  Exercise tolerated well No report of cardiac concerns or symptoms Strength training completed today  Goals Unmet:  Not Applicable  Comments: Service time is from 1030 to 1210    Dr. Rush Farmer is Medical Director for Pulmonary Rehab at [facility].

## 2016-08-22 ENCOUNTER — Encounter (HOSPITAL_COMMUNITY)
Admission: RE | Admit: 2016-08-22 | Discharge: 2016-08-22 | Disposition: A | Discharge status: Home / Self Care | Payer: Medicare Other | Source: Ambulatory Visit | Attending: Pulmonary Disease | Admitting: Pulmonary Disease

## 2016-08-22 VITALS — Wt 160.1 lb

## 2016-08-22 DIAGNOSIS — J439 Emphysema, unspecified: Secondary | ICD-10-CM

## 2016-08-22 NOTE — Progress Notes (Signed)
Daily Session Note  Patient Details  Name: [name] MRN: [id number] Date of Birth: May 12, 1941 Referring Provider:   April Manson Pulmonary Rehab Walk Test from 08/10/2016 in [place]  Referring Provider  Dr. Nelda Marseille      Encounter Date: 08/22/2016  Check In:     Session Check In - 08/22/16 1023      Check-In   Location [facility]   Staff Present Rosebud Poles, RN, BSN;Molly diVincenzo, MS, ACSM RCEP, Exercise Physiologist;Ariadne Rissmiller Ysidro Evert, RN;Portia Rollene Rotunda, RN, BSN   Supervising physician immediately available to respond to emergencies [facility] immediately available   Physician(s) Dr. Nada Maclachlan   Medication changes reported     No   Fall or balance concerns reported    No   Warm-up and Cool-down Performed as group-led instruction   Resistance Training Performed Yes   VAD Patient? No     Pain Assessment   Currently in Pain? No/denies   Multiple Pain Sites No      Capillary Blood Glucose: No results found for this or any previous visit (from the past 24 hour(s)).      Exercise Prescription Changes - 08/22/16 1200      Exercise Review   Progression Yes     Response to Exercise   Blood Pressure (Admit) 98/60   Blood Pressure (Exercise) 120/72   Blood Pressure (Exit) 108/60   Heart Rate (Admit) 78 bpm   Heart Rate (Exercise) 91 bpm   Heart Rate (Exit) 73 bpm   Oxygen Saturation (Admit) 95 %   Oxygen Saturation (Exercise) 94 %   Oxygen Saturation (Exit) 97 %   Rating of Perceived Exertion (Exercise) 14   Perceived Dyspnea (Exercise) 2   Duration Progress to 45 minutes of aerobic exercise without signs/symptoms of physical distress   Intensity THRR unchanged     Progression   Progression Continue to progress workloads to maintain intensity without signs/symptoms of physical distress.     Resistance Training   Training Prescription Yes   Weight blue bands   Reps 10-12  10 minutes of strength training      Interval Training   Interval Training No     NuStep   Level 4   Minutes 17   METs 2.6     Rower   Level 3.5   Minutes 17     Track   Laps 12   Minutes 17     Goals Met:  Exercise tolerated well No report of cardiac concerns or symptoms Strength training completed today  Goals Unmet:  Not Applicable  Comments: Service time is from 1030 to 1200    Dr. Rush Farmer is Medical Director for Pulmonary Rehab at [facility].

## 2016-08-24 ENCOUNTER — Encounter (HOSPITAL_COMMUNITY): Payer: Medicare Other

## 2016-08-29 ENCOUNTER — Encounter (HOSPITAL_COMMUNITY)
Admission: RE | Admit: 2016-08-29 | Discharge: 2016-08-29 | Disposition: A | Discharge status: Home / Self Care | Payer: Medicare Other | Source: Ambulatory Visit | Attending: Pulmonary Disease | Admitting: Pulmonary Disease

## 2016-08-29 VITALS — Wt 162.0 lb

## 2016-08-29 DIAGNOSIS — J439 Emphysema, unspecified: Secondary | ICD-10-CM | POA: Diagnosis not present

## 2016-08-29 NOTE — Progress Notes (Signed)
Daily Session Note  Patient Details  Name: [name] MRN: [id number] Date of Birth: 07-06-1941 Referring Provider:   April Manson Pulmonary Rehab Walk Test from 08/10/2016 in [place]  Referring Provider  Dr. Nelda Marseille      Encounter Date: 08/29/2016  Check In:   Capillary Blood Glucose: No results found for this or any previous visit (from the past 24 hour(s)).      Exercise Prescription Changes - 08/29/16 1200      Exercise Review   Progression Yes     Response to Exercise   Blood Pressure (Admit) 114/62   Blood Pressure (Exercise) 116/70   Blood Pressure (Exit) 118/60   Heart Rate (Admit) 80 bpm   Heart Rate (Exercise) 93 bpm   Heart Rate (Exit) 88 bpm   Oxygen Saturation (Admit) 96 %   Oxygen Saturation (Exercise) 93 %   Oxygen Saturation (Exit) 99 %   Rating of Perceived Exertion (Exercise) 13   Perceived Dyspnea (Exercise) 3   Duration Progress to 45 minutes of aerobic exercise without signs/symptoms of physical distress   Intensity THRR unchanged     Progression   Progression Continue to progress workloads to maintain intensity without signs/symptoms of physical distress.     Resistance Training   Training Prescription Yes   Weight blue bands   Reps 10-12  10 minutes of strength training     Interval Training   Interval Training No     NuStep   Level 4   Minutes 17   METs 2.9     Rower   Level 4   Minutes 17     Track   Laps 12   Minutes 17     Goals Met:  Exercise tolerated well No report of cardiac concerns or symptoms Strength training completed today  Goals Unmet:  Not Applicable  Comments: Service time is from 10:30am to 12:00pm    Dr. Rush Farmer is Medical Director for Pulmonary Rehab at [facility].

## 2016-08-31 ENCOUNTER — Encounter (HOSPITAL_COMMUNITY): Admission: RE | Admit: 2016-08-31 | Payer: Medicare Other | Source: Ambulatory Visit

## 2016-09-05 ENCOUNTER — Encounter (HOSPITAL_COMMUNITY)
Admission: RE | Admit: 2016-09-05 | Discharge: 2016-09-05 | Disposition: A | Discharge status: Home / Self Care | Payer: Medicare Other | Source: Ambulatory Visit | Attending: Pulmonary Disease | Admitting: Pulmonary Disease

## 2016-09-05 VITALS — Wt 161.6 lb

## 2016-09-05 DIAGNOSIS — J439 Emphysema, unspecified: Secondary | ICD-10-CM | POA: Diagnosis not present

## 2016-09-05 NOTE — Progress Notes (Signed)
Daily Session Note  Patient Details  Name: [name] MRN: [id number] Date of Birth: [birth year] Referring Provider:   April Manson Pulmonary Rehab Walk Test from 08/10/2016 in [place]  Referring Provider  Dr. Nelda Marseille      Encounter Date: 09/05/2016  Check In:     Session Check In - 09/05/16 1030      Check-In   Location [facility]   Staff Present Rosebud Poles, RN, BSN;Ramon Dredge, RN, MHA;Nalu [place]ouble[place] Rollene Rotunda, RN, BSN   Supervising physician immediately available to respond to emergencies [facility] immediately available   Physician(s) Dr. Ree Kida   Medication changes reported     No   Fall or balance concerns reported    No   Warm-up and Cool-down Performed as group-led instruction   Resistance Training Performed Yes   VAD Patient? No     Pain Assessment   Currently in Pain? No/denies   Multiple Pain Sites No      Capillary Blood Glucose: No results found for this or any previous visit (from the past 24 hour(s)).      Exercise Prescription Changes - 09/05/16 1232      Exercise Review   Progression Yes     Response to Exercise   Blood Pressure (Admit) 100/60   Blood Pressure (Exercise) 144/88   Blood Pressure (Exit) 104/60   Heart Rate (Admit) 81 bpm   Heart Rate (Exercise) 96 bpm   Heart Rate (Exit) 83 bpm   Oxygen Saturation (Admit) 97 %   Oxygen Saturation (Exercise) 92 %   Oxygen Saturation (Exit) 98 %   Rating of Perceived Exertion (Exercise) 13   Perceived Dyspnea (Exercise) 3   Duration Progress to 45 minutes of aerobic exercise without signs/symptoms of physical distress   Intensity THRR unchanged     Progression   Progression Continue to progress workloads to maintain intensity without signs/symptoms of physical distress.     Resistance Training   Training Prescription Yes   Weight blue bands   Reps 10-12  10 minutes of strength training     Interval Training   Interval  Training No     NuStep   Level 4   Minutes 17   METs 3.1     Rower   Level 5   Minutes 17     Track   Laps 12   Minutes 17     Goals Met:  Independence with exercise equipment Improved SOB with ADL's Using PLB without cueing & demonstrates good technique Exercise tolerated well No report of cardiac concerns or symptoms Strength training completed today  Goals Unmet:  Not Applicable  Comments: Service time is from 1030 to 1200   Dr. Rush Farmer is Medical Director for Pulmonary Rehab at [facility].

## 2016-09-07 ENCOUNTER — Encounter (HOSPITAL_COMMUNITY)
Admission: RE | Admit: 2016-09-07 | Discharge: 2016-09-07 | Disposition: A | Discharge status: Home / Self Care | Payer: Medicare Other | Source: Ambulatory Visit | Attending: Pulmonary Disease | Admitting: Pulmonary Disease

## 2016-09-07 VITALS — Wt 160.3 lb

## 2016-09-07 DIAGNOSIS — J439 Emphysema, unspecified: Secondary | ICD-10-CM | POA: Diagnosis not present

## 2016-09-07 NOTE — Progress Notes (Signed)
Daily Session Note  Patient Details  Name: [name] MRN: [id number] Date of Birth: [birth year] Referring Provider:   April Manson Pulmonary Rehab Walk Test from 08/10/2016 in [place]  Referring Provider  Dr. Nelda Marseille      Encounter Date: 09/07/2016  Check In:     Session Check In - 09/07/16 1030      Check-In   Location [facility]   Staff Present Trish Fountain, RN, BSN;Lisa Ysidro Evert, RN;Dinorah Masullo Leonia Reeves, RN, BSN;Ramon Dredge, RN, [facility]   Supervising physician immediately available to respond to emergencies [facility] immediately available   Physician(s) Dr. Wynetta Emery   Medication changes reported     No   Fall or balance concerns reported    No   Warm-up and Cool-down Performed as group-led instruction   Resistance Training Performed Yes   VAD Patient? No     Pain Assessment   Currently in Pain? No/denies   Multiple Pain Sites No      Capillary Blood Glucose: No results found for this or any previous visit (from the past 24 hour(s)).      Exercise Prescription Changes - 09/07/16 1200      Response to Exercise   Blood Pressure (Admit) 110/64   Blood Pressure (Exercise) 122/60   Blood Pressure (Exit) 114/60   Heart Rate (Admit) 77 bpm   Heart Rate (Exercise) 90 bpm   Heart Rate (Exit) 86 bpm   Oxygen Saturation (Admit) 97 %   Oxygen Saturation (Exercise) 94 %   Oxygen Saturation (Exit) 95 %   Rating of Perceived Exertion (Exercise) 12   Perceived Dyspnea (Exercise) 3   Duration Progress to 45 minutes of aerobic exercise without signs/symptoms of physical distress   Intensity THRR unchanged     Progression   Progression Continue to progress workloads to maintain intensity without signs/symptoms of physical distress.     Resistance Training   Training Prescription Yes   Weight blue bands   Reps 10-12  10 minutes of strength training     Interval Training   Interval Training No     NuStep   Level 4   Minutes 7  left early     Track   Laps 15   Minutes 17     Goals Met:  Exercise tolerated well Strength training completed today  Goals Unmet:  Not Applicable  Comments: Service time is from 1030 to 1215    Dr. Rush Farmer is Medical Director for Pulmonary Rehab at [facility].

## 2016-09-07 NOTE — Progress Notes (Signed)
Pulmonary Individual Treatment Plan  Patient Details  Name: [name] MRN: [id number] Date of Birth: 02/20/1941 Referring Provider:   Flowsheet Row Pulmonary Rehab Walk Test from 08/10/2016 in [facility]  Referring Provider  Dr. Yacoub      Initial Encounter Date:  Flowsheet Row Pulmonary Rehab Walk Test from 08/10/2016 in [facility]  Date  08/11/16  Referring Provider  Dr. Yacoub      Visit Diagnosis: Pulmonary emphysema, unspecified emphysema type (HCC)  Patient's Home Medications on Admission:   Current Outpatient Prescriptions:  .  acetaminophen (TYLENOL) 325 MG tablet, Take by mouth., Disp: , Rfl:  .  ADVAIR DISKUS 250-50 MCG/DOSE AEPB, USE 1 INHALATION TWICE A DAY, Disp: 180 each, Rfl: 3 .  albuterol (PROVENTIL) (2.5 MG/3ML) 0.083% nebulizer solution, Take 3 mLs (2.5 mg total) by nebulization every 4 (four) hours as needed for wheezing or shortness of breath., Disp: 450 mL, Rfl: 3 .  ALPRAZolam (XANAX) 0.5 MG tablet, TAKE 1 TABLET BY MOUTH EVERY DAY AT BEDTIME AS NEEDED ANXIETY OR INSOMNIA, Disp: 90 tablet, Rfl: 0 .  amLODipine (NORVASC) 10 MG tablet, Take 1 tablet (10 mg total) by mouth daily., Disp: 90 tablet, Rfl: 3 .  aspirin 81 MG tablet, Take 81 mg by mouth every other day. , Disp: , Rfl:  .  b complex-C-folic acid 1 MG capsule, Take 1 capsule by mouth daily.  , Disp: , Rfl:  .  buPROPion (WELLBUTRIN XL) 150 MG 24 hr tablet, TAKE 1 TABLET DAILY, Disp: 90 tablet, Rfl: 3 .  Calcium Carbonate-Vit D-Min (CALCIUM 1200 PO), Take by mouth., Disp: , Rfl:  .  celecoxib (CELEBREX) 200 MG capsule, TAKE 1 CAPSULE DAILY, Disp: 90 capsule, Rfl: 2 .  Cyanocobalamin 1000 MCG CAPS, Take by mouth., Disp: , Rfl:  .  diphenhydrAMINE (BENADRYL) 50 MG capsule, Take 50 mg by mouth at bedtime as needed for sleep., Disp: , Rfl:  .  DULoxetine (CYMBALTA) 60 MG capsule, TAKE 1 CAPSULE DAILY, Disp: 90 capsule, Rfl: 3 .   fluticasone (FLONASE) 50 MCG/ACT nasal spray, Place 2 sprays into both nostrils daily. (Patient not taking: Reported on 08/07/2016), Disp: 48 g, Rfl: 3 .  levothyroxine (SYNTHROID, LEVOTHROID) 100 MCG tablet, TAKE 1 TABLET EVERY MORNING, Disp: 90 tablet, Rfl: 2 .  loratadine (CLARITIN) 10 MG tablet, TAKE 1 TABLET DAILY (Patient not taking: Reported on 08/07/2016), Disp: 90 tablet, Rfl: 3 .  losartan-hydrochlorothiazide (HYZAAR) 100-25 MG tablet, Take by mouth., Disp: , Rfl:  .  omeprazole (PRILOSEC) 20 MG capsule, TAKE 1 CAPSULE DAILY, Disp: 90 capsule, Rfl: 1 .  pravastatin (PRAVACHOL) 40 MG tablet, Take 1 tablet (40 mg total) by mouth daily. Stop simvastatin, Disp: 90 tablet, Rfl: 3 .  simvastatin (ZOCOR) 40 MG tablet, Take 1 tablet (40 mg total) by mouth at bedtime., Disp: 90 tablet, Rfl: 3 .  traZODone (DESYREL) 50 MG tablet, Take 50 mg by mouth at bedtime as needed for sleep. , Disp: , Rfl:  .  VITAMIN D, CHOLECALCIFEROL, PO, Take 1 tablet by mouth daily., Disp: , Rfl:   Past Medical History: Past Medical History:  Diagnosis Date  . Allergic rhinitis   . Anemia   . Anxiety   . Arrhythmia   . Arthritis    "hands, neck, right shoulder" (07/28/2015)  . Aseptic necrosis (HCC)   . Asthma dxc'd 05/2015  . Basal cell cancer   . Bleeding stomach ulcer ~ 06/2005  .   COPD (chronic obstructive pulmonary disease) (HCC)   . Depression   . Diverticula of colon   . Esophagitis   . Gastric ulcer with hemorrhage   . GERD (gastroesophageal reflux disease)   . Heart murmur   . History of blood transfusion 06/2005   "related to pneumonia"  . Hyperlipidemia   . Hypertension   . Hypothyroidism   . Kidney stones   . Lung cancer (HCC)   . Mitral valve prolapse    "no ORs" (07/28/2015)  . Osteopenia   . Pneumonia 1970's; 06/2005  . Sleep apnea dx'd 06/2015   "haven't had followup appt yet" 07/28/2015)  . Squamous cell skin cancer     Tobacco Use: History  Smoking Status  . Former Smoker   . Packs/day: 0.00  . Years: 43.00  . Types: Cigarettes  Smokeless Tobacco  . Never Used    Labs: Recent Review Flowsheet Data    Labs for ITP Cardiac and Pulmonary Rehab Latest Ref Rng & Units 12/06/2009 12/27/2009 12/29/2009 01/16/2014 04/06/2016   Cholestrol 125 - 200 mg/dL - - - 184 194   LDLCALC <130 mg/dL - - - 97 86   HDL >=46 mg/dL - - - 58 62   Trlycerides <150 mg/dL - - - 143 232(H)   PHART 7.350 - 7.400 - 7.438(H) 7.390 - -   PCO2ART 35.0 - 45.0 mmHg - 37.1 40.9 - -   HCO3 20.0 - 24.0 mEq/L - 24.7(H) 24.7(H) - -   TCO2 0 - 100 mmol/L 27 25.8 26 - -   ACIDBASEDEF 0.0 - 2.0 mmol/L - - - - -   O2SAT % - 82.2 89.0 - -      Capillary Blood Glucose: Lab Results  Component Value Date   GLUCAP 106 (H) 01/02/2010   GLUCAP 124 (H) 01/01/2010   GLUCAP 132 (H) 01/01/2010   GLUCAP 126 (H) 01/01/2010   GLUCAP 114 (H) 01/01/2010     ADL UCSD:   Pulmonary Function Assessment:     Pulmonary Function Assessment - 07/28/16 1423      Breath   Bilateral Breath Sounds Clear   Shortness of Breath Yes;Limiting activity      Exercise Target Goals:    Exercise Program Goal: Individual exercise prescription set with THRR, safety & activity barriers. Participant demonstrates ability to understand and report RPE using BORG scale, to self-measure pulse accurately, and to acknowledge the importance of the exercise prescription.  Exercise Prescription Goal: Starting with aerobic activity 30 plus minutes a day, 3 days per week for initial exercise prescription. Provide home exercise prescription and guidelines that participant acknowledges understanding prior to discharge.  Activity Barriers & Risk Stratification:     Activity Barriers & Cardiac Risk Stratification - 07/28/16 1421      Activity Barriers & Cardiac Risk Stratification   Activity Barriers Deconditioning;Shortness of Breath      6 Minute Walk:     6 Minute Walk    Row Name 08/11/16 0838         6 Minute  Walk   Phase Initial     Distance 1563 feet     Walk Time 6 minutes     # of Rest Breaks 0     MPH 2.96     METS 2.2     RPE 14     Perceived Dyspnea  3     Symptoms No     Resting HR 87 bpm     Resting BP 150/81       Max Ex. HR 108 bpm     Max Ex. BP 198/80  2 days into new BP medicine     2 Minute Post BP 150/74       Interval HR   Baseline HR 87     1 Minute HR 91     2 Minute HR 108     3 Minute HR 108     4 Minute HR 95     5 Minute HR 107     6 Minute HR 104     2 Minute Post HR 93     Interval Heart Rate? Yes       Interval Oxygen   Interval Oxygen? Yes     Baseline Oxygen Saturation % 96 %     Baseline Liters of Oxygen 0 L     1 Minute Oxygen Saturation % 93 %     1 Minute Liters of Oxygen 0 L     2 Minute Oxygen Saturation % 96 %     2 Minute Liters of Oxygen 0 L     3 Minute Oxygen Saturation % 96 %     3 Minute Liters of Oxygen 0 L     4 Minute Oxygen Saturation % 93 %     4 Minute Liters of Oxygen 0 L     5 Minute Oxygen Saturation % 92 %     5 Minute Liters of Oxygen 0 L     6 Minute Oxygen Saturation % 90 %     6 Minute Liters of Oxygen 0 L     2 Minute Post Oxygen Saturation % 93 %     2 Minute Post Liters of Oxygen 0 L        Initial Exercise Prescription:     Initial Exercise Prescription - 08/11/16 0800      Date of Initial Exercise RX and Referring Provider   Date 08/11/16   Referring Provider Dr. Yacoub     NuStep   Level 2   Minutes 17   METs 1.7     Rower   Level 2   Minutes 17     Track   Laps 10   Minutes 17     Prescription Details   Frequency (times per week) 2   Duration Progress to 45 minutes of aerobic exercise without signs/symptoms of physical distress     Intensity   THRR 40-80% of Max Heartrate 58-117   Ratings of Perceived Exertion 11-13   Perceived Dyspnea 0-4     Progression   Progression Continue progressive overload as per policy without signs/symptoms or physical distress.     Resistance Training    Training Prescription Yes   Weight blue bands   Reps 10-12      Perform Capillary Blood Glucose checks as needed.  Exercise Prescription Changes:     Exercise Prescription Changes    Row Name 08/15/16 1200 08/17/16 1200 08/22/16 1200 08/29/16 1200 09/05/16 1232     Exercise Review   Progression  -  - Yes Yes Yes     Response to Exercise   Blood Pressure (Admit) 112/60 114/64 98/60 114/62 100/60   Blood Pressure (Exercise) 126/70 140/82 120/72 116/70 144/88   Blood Pressure (Exit) 102/50 116/60 108/60 118/60 104/60   Heart Rate (Admit) 79 bpm 81 bpm 78 bpm 80 bpm 81 bpm   Heart Rate (Exercise) 94 bpm 92 bpm 91 bpm 93 bpm 96 bpm   Heart Rate (  Exit) 84 bpm 81 bpm 73 bpm 88 bpm 83 bpm   Oxygen Saturation (Admit) 94 % 97 % 95 % 96 % 97 %   Oxygen Saturation (Exercise) 95 % 95 % 94 % 93 % 92 %   Oxygen Saturation (Exit) 95 % 97 % 97 % 99 % 98 %   Rating of Perceived Exertion (Exercise) 13 12 14 13 13   Perceived Dyspnea (Exercise) 1 1 2 3 3   Duration Progress to 45 minutes of aerobic exercise without signs/symptoms of physical distress Progress to 45 minutes of aerobic exercise without signs/symptoms of physical distress Progress to 45 minutes of aerobic exercise without signs/symptoms of physical distress Progress to 45 minutes of aerobic exercise without signs/symptoms of physical distress Progress to 45 minutes of aerobic exercise without signs/symptoms of physical distress   Intensity -  40-80% HRR THRR unchanged THRR unchanged THRR unchanged THRR unchanged     Progression   Progression Continue to progress workloads to maintain intensity without signs/symptoms of physical distress. Continue to progress workloads to maintain intensity without signs/symptoms of physical distress. Continue to progress workloads to maintain intensity without signs/symptoms of physical distress. Continue to progress workloads to maintain intensity without signs/symptoms of physical distress. Continue  to progress workloads to maintain intensity without signs/symptoms of physical distress.     Resistance Training   Training Prescription Yes Yes Yes Yes Yes   Weight blue bands blue bands blue bands blue bands blue bands   Reps 10-12  10 minutes of strength training 10-12  10 mi utes of strength training 10-12  10 minutes of strength training 10-12  10 minutes of strength training 10-12  10 minutes of strength training     Interval Training   Interval Training No No No No No     NuStep   Level 3  - 4 4 4   Minutes 17  - 17 17 17   METs 2.4  - 2.6 2.9 3.1     Rower   Level 1 2 3.5 4 5   Minutes 17 17 17 17 17     Track   Laps 11 8 12 12 12   Minutes 17 17 17 17 17      Exercise Comments:     Exercise Comments    Row Name 08/29/16 0815           Exercise Comments Patient has only attended three exercise sessions. Will cont. to monitor and progress.          Discharge Exercise Prescription (Final Exercise Prescription Changes):     Exercise Prescription Changes - 09/05/16 1232      Exercise Review   Progression Yes     Response to Exercise   Blood Pressure (Admit) 100/60   Blood Pressure (Exercise) 144/88   Blood Pressure (Exit) 104/60   Heart Rate (Admit) 81 bpm   Heart Rate (Exercise) 96 bpm   Heart Rate (Exit) 83 bpm   Oxygen Saturation (Admit) 97 %   Oxygen Saturation (Exercise) 92 %   Oxygen Saturation (Exit) 98 %   Rating of Perceived Exertion (Exercise) 13   Perceived Dyspnea (Exercise) 3   Duration Progress to 45 minutes of aerobic exercise without signs/symptoms of physical distress   Intensity THRR unchanged     Progression   Progression Continue to progress workloads to maintain intensity without signs/symptoms of physical distress.     Resistance Training   Training Prescription Yes   Weight blue bands     Reps 10-12  10 minutes of strength training     Interval Training   Interval Training No     NuStep   Level 4   Minutes 17    METs 3.1     Rower   Level 5   Minutes 17     Track   Laps 12   Minutes 17       Nutrition:  Target Goals: Understanding of nutrition guidelines, daily intake of sodium <1547m, cholesterol <2026m calories 30% from fat and 7% or less from saturated fats, daily to have 5 or more servings of fruits and vegetables.  Biometrics:    Nutrition Therapy Plan and Nutrition Goals:   Nutrition Discharge: Rate Your Plate Scores:   Psychosocial: Target Goals: Acknowledge presence or absence of depression, maximize coping skills, provide positive support system. Participant is able to verbalize types and ability to use techniques and skills needed for reducing stress and depression.  Initial Review & Psychosocial Screening:     Initial Psych Review & Screening - 07/28/16 1425      Initial Review   Current issues with History of Depression;Current Stress Concerns   Comments chronic illness of husban     Family Dynamics   Good Support System? Yes     Barriers   Psychosocial barriers to participate in program Psychosocial barriers identified (see note)     Screening Interventions   Interventions Encouraged to exercise      Quality of Life Scores:   PHQ-9: Recent Review Flowsheet Data    [facility] 07/28[facility]2016 07/26/2015 12/01/2013   Decreased Interest 0 0 0   Down, Depressed, Hopeless 0 0 0   PHQ - 2 Score 0 0 0   Altered sleeping - 0 -   Tired, decreased energy - 0 -   Change in appetite - 0 -   Feeling bad or failure about yourself  - 0 -   Trouble concentrating - 0 -   Moving slowly or fidgety/restless - 0 -   Suicidal thoughts - 0 -   PHQ-9 Score - 0 -   Difficult doing work/chores - Not difficult at all -      Psychosocial Evaluation and Intervention:     Psychosocial Evaluation - 07/28/16 1428      Psychosocial Evaluation & Interventions   Interventions Encouraged to exercise with the program and follow exercise prescription       Psychosocial Re-Evaluation:     Psychosocial Re-Evaluation    Ro[place]ightoname 09/05/16 0827             Psychosocial Re-Evaluation   Interventions Encouraged to attend Pulmonary Rehabilitation for the exercise       Comments no psychosocial barriers identified in the past 30 days         Education: Education Goals: Education classes will be provided on a weekly basis, covering required topics. Participant will state understanding/return demonstration of topics presented.  Learning Barriers/Preferences:     Learning Barriers/Preferences - 07/28/16 1422      Learning Barriers/Preferences   Learning Preferences Group Instruction;Written Material;Individual Instruction      Education Topics: Risk Factor Reduction:  -Group instruction that is supported by a PowerPoint presentation. Instructor discusses the definition of a risk factor, different risk factors for pulmonary disease, and how the heart and lungs work together.     Nutrition for Pulmonary Patient:  -Group instruction provided by PowerPoint slides, verbal discussion, and written materials to support subject matter. The instructor  gives an explanation and review of healthy diet recommendations, which includes a discussion on weight management, recommendations for fruit and vegetable consumption, as well as protein, fluid, caffeine, fiber, sodium, sugar, and alcohol. Tips for eating when patients are short of breath are discussed.   Pursed Lip Breathing:  -Group instruction that is supported by demonstration and informational handouts. Instructor discusses the benefits of pursed lip and diaphragmatic breathing and detailed demonstration on how to preform both.     Oxygen Safety:  -Group instruction provided by PowerPoint, verbal discussion, and written material to support subject matter. There is an overview of "What is Oxygen" and "Why do we need it".  Instructor also reviews how to create a safe environment for oxygen  use, the importance of using oxygen as prescribed, and the risks of noncompliance. There is a brief discussion on traveling with oxygen and resources the patient may utilize.   Oxygen Equipment:  -Group instruction provided by [facility] Staff utilizing handouts, written materials, and equipment demonstrations.   Signs and Symptoms:  -Group instruction provided by written material and verbal discussion to support subject matter. Warning signs and symptoms of infection, stroke, and heart attack are reviewed and when to call the physician/911 reinforced. Tips for preventing the spread of infection discussed. Flowsheet Row PULMONARY REHAB OTHER RESPIRATORY from 08/17/2016 in [place]  Date  08/17/16  Educator  RN  Instruction Review Code  2- meets goals/outcomes      Advanced Directives:  -Group instruction provided by verbal instruction and written material to support subject matter. Instructor reviews Advanced Directive laws and proper instruction for filling out document.   Pulmonary Video:  -Group video education that reviews the importance of medication and oxygen compliance, exercise, good nutrition, pulmonary hygiene, and pursed lip and diaphragmatic breathing for the pulmonary patient.   Exercise for the Pulmonary Patient:  -Group instruction that is supported by a PowerPoint presentation. Instructor discusses benefits of exercise, core components of exercise, frequency, duration, and intensity of an exercise routine, importance of utilizing pulse oximetry during exercise, safety while exercising, and options of places to exercise outside of rehab.     Pulmonary Medications:  -Verbally interactive group education provided by instructor with focus on inhaled medications and proper administration.   Anatomy and Physiology of the Respiratory System and Intimacy:  -Group instruction provided by PowerPoint, verbal discussion, and written material to  support subject matter. Instructor reviews respiratory cycle and anatomical components of the respiratory system and their functions. Instructor also reviews differences in obstructive and restrictive respiratory diseases with examples of each. Intimacy, Sex, and Sexuality differences are reviewed with a discussion on how relationships can change when diagnosed with pulmonary disease. Common sexual concerns are reviewed.   Knowledge Questionnaire Score:   Core Components/Risk Factors/Patient Goals at Admission:     Personal Goals and Risk Factors at Admission - 07/28/16 1424      Core Components/Risk Factors/Patient Goals on Admission   Improve shortness of breath with ADL's Yes   Intervention Provide education, individualized exercise plan and daily activity instruction to help decrease symptoms of SOB with activities of daily living.   Expected Outcomes Short Term: Achieves a reduction of symptoms when performing activities of daily living.   Hypertension Yes   Intervention Provide education on lifestyle modifcations including regular physical activity/exercise, weight management, moderate sodium restriction and increased consumption of fresh fruit, vegetables, and low fat dairy, alcohol moderation, and smoking cessation.;Monitor prescription use compliance.   Expected  Outcomes Short Term: Continued assessment and intervention until BP is < 140/4m HG in hypertensive participants. < 130/875mHG in hypertensive participants with diabetes, heart failure or chronic kidney disease.;Long Term: Maintenance of blood pressure at goal levels.      Core Components/Risk Factors/Patient Goals Review:      Goals and Risk Factor Review    Row Name 09/05/16 0825             Core Components/Risk Factors/Patient Goals Review   Personal Goals Review Improve shortness of breath with ADL's       Review see comments section on ITP       Expected Outcomes see Admission outcomes          Core  Components/Risk Factors/Patient Goals at Discharge (Final Review):      Goals and Risk Factor Review - 09/05/16 0825      Core Components/Risk Factors/Patient Goals Review   Personal Goals Review Improve shortness of breath with ADL's   Review see comments section on ITP   Expected Outcomes see Admission outcomes      ITP Comments:   Comments: ITP REVIEW Pt is making expected progress toward pulmonary rehab goals after completing 5 sessions. He hypertension is now well managed after being placed on a new medication. Recommend continued exercise, life style modification, education, and utilization of breathing techniques to increase stamina and strength and decrease shortness of breath with exertion.

## 2016-09-12 ENCOUNTER — Encounter (HOSPITAL_COMMUNITY): Payer: Medicare Other

## 2016-09-12 ENCOUNTER — Telehealth (HOSPITAL_COMMUNITY): Payer: Self-pay | Admitting: Family Medicine

## 2016-09-12 NOTE — Telephone Encounter (Signed)
Pt called to cancel due to personal reasons.... KJ

## 2016-09-14 ENCOUNTER — Encounter (HOSPITAL_COMMUNITY)
Admission: RE | Admit: 2016-09-14 | Discharge: 2016-09-14 | Disposition: A | Discharge status: Home / Self Care | Payer: Medicare Other | Source: Ambulatory Visit | Attending: Pulmonary Disease | Admitting: Pulmonary Disease

## 2016-09-14 VITALS — Wt 159.4 lb

## 2016-09-14 DIAGNOSIS — J439 Emphysema, unspecified: Secondary | ICD-10-CM | POA: Diagnosis not present

## 2016-09-14 NOTE — Progress Notes (Signed)
Daily Session Note  Patient Details  Name: [name] MRN: [id number] Date of Birth: [birth year] Referring Provider:   April Manson Pulmonary Rehab Walk Test from 08/10/2016 in [place]  Referring Provider  Dr. Nelda Marseille      Encounter Date: 09/14/2016  Check In:     Session Check In - 09/14/16 1026      Check-In   Location [facility]   Staff Present Su Hilt, MS, ACSM RCEP, Exercise Physiologist;Joan Leonia Reeves, RN, Luisa Hart, RN, Roque Cash, RN   Supervising physician immediately available to respond to emergencies [facility] immediately available   Physician(s) Dr. Tana Coast   Medication changes reported     No   Fall or balance concerns reported    No   Warm-up and Cool-down Performed as group-led instruction   Resistance Training Performed Yes   VAD Patient? No     Pain Assessment   Currently in Pain? No/denies   Multiple Pain Sites No      Capillary Blood Glucose: No results found for this or any previous visit (from the past 24 hour(s)).      Exercise Prescription Changes - 09/14/16 1200      Exercise Review   Progression Yes     Response to Exercise   Blood Pressure (Admit) 110/64   Blood Pressure (Exercise) 124/76   Blood Pressure (Exit) 110/60   Heart Rate (Admit) 74 bpm   Heart Rate (Exercise) 83 bpm   Heart Rate (Exit) 77 bpm   Oxygen Saturation (Admit) 97 %   Oxygen Saturation (Exercise) 97 %   Oxygen Saturation (Exit) 97 %   Rating of Perceived Exertion (Exercise) 12   Perceived Dyspnea (Exercise) 1   Duration Progress to 45 minutes of aerobic exercise without signs/symptoms of physical distress   Intensity THRR unchanged     Progression   Progression Continue to progress workloads to maintain intensity without signs/symptoms of physical distress.     Resistance Training   Training Prescription Yes   Weight blue bands   Reps 10-12  10 minutes of strength training      Interval Training   Interval Training No     NuStep   Level 4   Minutes 17  left early   METs 3.3     Rower   Level 5   Minutes 17     Goals Met:  Exercise tolerated well No report of cardiac concerns or symptoms Strength training completed today  Goals Unmet:  Not Applicable  Comments: Service time is from 10:30am to 12:30pm    Dr. Rush Farmer is Medical Director for Pulmonary Rehab at [facility].

## 2016-09-14 NOTE — Progress Notes (Signed)
I have reviewed a Home Exercise Prescription with [name] . [name] is not currently exercising at home.  The patient was advised to walk 2 days a week for 30-45 minutes.  [name] and I discussed how to progress their exercise prescription.  The patient stated that their goals were to be able to do more things, climb the stairs without being so short of breath, enjoy traveling, work in the garden, and increase daily energy.  The patient stated that they understand the exercise prescription.  We reviewed exercise guidelines, target heart rate during exercise, oxygen use, weather, home pulse oximeter, endpoints for exercise, and goals.  Patient is encouraged to come to me with any questions. I will continue to follow up with the patient to assist them with progression and safety.

## 2016-09-19 ENCOUNTER — Encounter (HOSPITAL_COMMUNITY)
Admission: RE | Admit: 2016-09-19 | Discharge: 2016-09-19 | Disposition: A | Discharge status: Home / Self Care | Payer: Medicare Other | Source: Ambulatory Visit | Attending: Pulmonary Disease | Admitting: Pulmonary Disease

## 2016-09-19 VITALS — Wt 160.9 lb

## 2016-09-19 DIAGNOSIS — J439 Emphysema, unspecified: Secondary | ICD-10-CM | POA: Diagnosis not present

## 2016-09-19 NOTE — Progress Notes (Signed)
Daily Session Note  Patient Details  Name: [name] MRN: [id number] Date of Birth: 02-19-1941 Referring Provider:   April Manson Pul[facility] Rehab Walk Test from 08/10/2016 in [place]  Referring Provider  Dr. Nelda Marseille      Encounter Date: 09/19/2016  Check In:     Session Check In - 09/19/16 1033      Check-In   Location [facility]   Staff Present Rosebud Poles, RN, BSN;Molly diVincenzo, MS, ACSM RCEP, Exercise Physiologist;Lisa Ysidro Evert, RN;Portia Rollene Rotunda, RN, BSN   Supervising physician immediately available to respond to emergencies [facility] immediately available   Physician(s) Dr. Tana Coast   Medication changes reported     No   Fall or balance concerns reported    No   Warm-up and Cool-down Performed as group-led instruction   Resistance Training Performed Yes   VAD Patient? No     Pain Assessment   Currently in Pain? No/denies   Multiple Pain Sites No      Capillary Blood Glucose: No results found for this or any previous visit (from the past 24 hour(s)).      Exercise Prescription Changes - 09/19/16 1200      Response to Exercise   Blood Pressure (Admit) 140/70   Blood Pressure (Exercise) 100/60   Blood Pressure (Exit) 100/56   Heart Rate (Admit) 78 bpm   Heart Rate (Exercise) 89 bpm   Heart Rate (Exit) 80 bpm   Oxygen Saturation (Admit) 99 %   Oxygen Saturation (Exercise) 96 %   Oxygen Saturation (Exit) 97 %   Rating of Perceived Exertion (Exercise) 13   Perceived Dyspnea (Exercise) 2.5   Duration Progress to 45 minutes of aerobic exercise without signs/symptoms of physical distress   Intensity THRR unchanged     Progression   Progression Continue to progress workloads to maintain intensity without signs/symptoms of physical distress.     Resistance Training   Training Prescription Yes   Weight blue bands   Reps 10-12  10 minutes of strength training     Interval Training   Interval  Training No     NuStep   Level 5   Minutes 17   METs 3.3     Rower   Level 5   Minutes 17     Track   Laps 8   Minutes 17     Goals Met:  Exercise tolerated well Strength training completed today  Goals Unmet:  Not Applicable  Comments: Service time is from 1030 to 1200    Dr. Rush Farmer is Medical Director for Pulmonary Rehab at [facility].

## 2016-09-21 ENCOUNTER — Encounter (HOSPITAL_COMMUNITY)
Admission: RE | Admit: 2016-09-21 | Discharge: 2016-09-21 | Disposition: A | Discharge status: Home / Self Care | Payer: Medicare Other | Source: Ambulatory Visit | Attending: Pulmonary Disease | Admitting: Pulmonary Disease

## 2016-09-21 VITALS — Wt 159.6 lb

## 2016-09-21 DIAGNOSIS — J439 Emphysema, unspecified: Secondary | ICD-10-CM

## 2016-09-21 NOTE — Progress Notes (Signed)
Daily Session Note  Patient Details  Name: [name] MRN: [id number] Date of Birth: 1941/06/07 Referring Provider:   April Manson Pulmonary Rehab Walk Test from 08/10/2016 in [place]  Referring Provider  Dr. Nelda Marseille      Encounter Date: 09/21/2016  Check In:     Session Check In - 09/21/16 1030      Check-In   Location [facility]   Staff Present Rosebud Poles, RN, BSN;Thresia Ramanathan, MS, ACSM RCEP, Exercise Physiologist;Lisa Ysidro Evert, RN;Portia Rollene Rotunda, RN, BSN   Supervising physician immediately available to respond to emergencies [facility] immediately available   Physician(s) Dr. Eliseo Squires   Medication changes reported     No   Fall or balance concerns reported    No   Warm-up and Cool-down Performed as group-led instruction   Resistance Training Performed Yes   VAD Patient? No     Pain Assessment   Currently in Pain? No/denies   Multiple Pain Sites No      Capillary Blood Glucose: No results found for this or any previous visit (from the past 24 hour(s)).      Exercise Prescription Changes - 09/21/16 1200      Response to Exercise   Blood Pressure (Admit) 122/70   Blood Pressure (Exercise) 120/70   Blood Pressure (Exit) 108/58   Heart Rate (Admit) 75 bpm   Heart Rate (Exercise) 91 bpm   Heart Rate (Exit) 75 bpm   Oxygen Saturation (Admit) 98 %   Oxygen Saturation (Exercise) 95 %   Oxygen Saturation (Exit) 95 %   Rating of Perceived Exertion (Exercise) 13   Perceived Dyspnea (Exercise) 2   Duration Progress to 45 minutes of aerobic exercise without signs/symptoms of physical distress   Intensity THRR unchanged     Progression   Progression Continue to progress workloads to maintain intensity without signs/symptoms of physical distress.     Resistance Training   Training Prescription Yes   Weight blue bands   Reps 10-12  10 minutes of strength training     Interval Training   Interval  Training No     NuStep   Level 5   Minutes 17   METs 3.1     Track   Laps 15   Minutes 17     Goals Met:  Exercise tolerated well No report of cardiac concerns or symptoms Strength training completed today  Goals Unmet:  Not Applicable  Comments: Service time is from 10:30am to 12:30p    Dr. Rush Farmer is Medical Director for Pulmonary Rehab at [facility].

## 2016-09-26 ENCOUNTER — Encounter (HOSPITAL_COMMUNITY): Payer: Medicare Other

## 2016-09-28 ENCOUNTER — Encounter (HOSPITAL_COMMUNITY): Admission: RE | Admit: 2016-09-28 | Payer: Medicare Other | Source: Ambulatory Visit

## 2016-10-03 ENCOUNTER — Other Ambulatory Visit: Payer: Self-pay | Admitting: Family Medicine

## 2016-10-03 ENCOUNTER — Encounter (HOSPITAL_COMMUNITY)
Admission: RE | Admit: 2016-10-03 | Discharge: 2016-10-03 | Disposition: A | Discharge status: Home / Self Care | Payer: Medicare Other | Source: Ambulatory Visit | Attending: Pulmonary Disease | Admitting: Pulmonary Disease

## 2016-10-03 VITALS — Wt 158.5 lb

## 2016-10-03 DIAGNOSIS — M79674 Pain in right toe(s): Secondary | ICD-10-CM | POA: Diagnosis not present

## 2016-10-03 DIAGNOSIS — J439 Emphysema, unspecified: Secondary | ICD-10-CM | POA: Diagnosis not present

## 2016-10-03 DIAGNOSIS — M79675 Pain in left toe(s): Secondary | ICD-10-CM | POA: Diagnosis not present

## 2016-10-03 DIAGNOSIS — L600 Ingrowing nail: Secondary | ICD-10-CM | POA: Diagnosis not present

## 2016-10-03 NOTE — Progress Notes (Signed)
Pulmonary Individual Treatment Plan  Patient Details  Name: [name] MRN: [id number] Date of Birth: 07/05/1941 Referring Provider:   Flowsheet Row Pulmonary Rehab Walk Test from 08/10/2016 in [facility]  Referring Provider  Dr. Yacoub      Initial Encounter Date:  Flowsheet Row Pulmonary Rehab Walk Test from 08/10/2016 in [facility]  Date  08/11/16  Referring Provider  Dr. Yacoub      Visit Diagnosis: Pulmonary emphysema, unspecified emphysema type (HCC)  Patient's Home Medications on Admission:   Current Outpatient Prescriptions:  .  acetaminophen (TYLENOL) 325 MG tablet, Take by mouth., Disp: , Rfl:  .  ADVAIR DISKUS 250-50 MCG/DOSE AEPB, USE 1 INHALATION TWICE A DAY, Disp: 180 each, Rfl: 3 .  albuterol (PROVENTIL) (2.5 MG/3ML) 0.083% nebulizer solution, Take 3 mLs (2.5 mg total) by nebulization every 4 (four) hours as needed for wheezing or shortness of breath., Disp: 450 mL, Rfl: 3 .  ALPRAZolam (XANAX) 0.5 MG tablet, TAKE 1 TABLET BY MOUTH EVERY DAY AT BEDTIME AS NEEDED ANXIETY OR INSOMNIA, Disp: 90 tablet, Rfl: 0 .  amLODipine (NORVASC) 10 MG tablet, Take 1 tablet (10 mg total) by mouth daily., Disp: 90 tablet, Rfl: 3 .  aspirin 81 MG tablet, Take 81 mg by mouth every other day. , Disp: , Rfl:  .  b complex-C-folic acid 1 MG capsule, Take 1 capsule by mouth daily.  , Disp: , Rfl:  .  buPROPion (WELLBUTRIN XL) 150 MG 24 hr tablet, TAKE 1 TABLET DAILY, Disp: 90 tablet, Rfl: 3 .  Calcium Carbonate-Vit D-Min (CALCIUM 1200 PO), Take by mouth., Disp: , Rfl:  .  celecoxib (CELEBREX) 200 MG capsule, TAKE 1 CAPSULE DAILY, Disp: 90 capsule, Rfl: 2 .  Cyanocobalamin 1000 MCG CAPS, Take by mouth., Disp: , Rfl:  .  diphenhydrAMINE (BENADRYL) 50 MG capsule, Take 50 mg by mouth at bedtime as needed for sleep., Disp: , Rfl:  .  DULoxetine (CYMBALTA) 60 MG capsule, TAKE 1 CAPSULE DAILY, Disp: 90 capsule, Rfl: 3 .   fluticasone (FLONASE) 50 MCG/ACT nasal spray, Place 2 sprays into both nostrils daily. (Patient not taking: Reported on 08/07/2016), Disp: 48 g, Rfl: 3 .  levothyroxine (SYNTHROID, LEVOTHROID) 100 MCG tablet, TAKE 1 TABLET EVERY MORNING, Disp: 90 tablet, Rfl: 2 .  loratadine (CLARITIN) 10 MG tablet, TAKE 1 TABLET DAILY (Patient not taking: Reported on 08/07/2016), Disp: 90 tablet, Rfl: 3 .  losartan-hydrochlorothiazide (HYZAAR) 100-25 MG tablet, Take by mouth., Disp: , Rfl:  .  omeprazole (PRILOSEC) 20 MG capsule, TAKE 1 CAPSULE DAILY, Disp: 90 capsule, Rfl: 1 .  pravastatin (PRAVACHOL) 40 MG tablet, Take 1 tablet (40 mg total) by mouth daily. Stop simvastatin, Disp: 90 tablet, Rfl: 3 .  simvastatin (ZOCOR) 40 MG tablet, Take 1 tablet (40 mg total) by mouth at bedtime., Disp: 90 tablet, Rfl: 3 .  traZODone (DESYREL) 50 MG tablet, Take 50 mg by mouth at bedtime as needed for sleep. , Disp: , Rfl:  .  VITAMIN D, CHOLECALCIFEROL, PO, Take 1 tablet by mouth daily., Disp: , Rfl:   Past Medical History: Past Medical History:  Diagnosis Date  . Allergic rhinitis   . Anemia   . Anxiety   . Arrhythmia   . Arthritis    "hands, neck, right shoulder" (07/28/2015)  . Aseptic necrosis (HCC)   . Asthma dxc'd 05/2015  . Basal cell cancer   . Bleeding stomach ulcer ~ 06/2005  .   COPD (chronic obstructive pulmonary disease) (HCC)   . Depression   . Diverticula of colon   . Esophagitis   . Gastric ulcer with hemorrhage   . GERD (gastroesophageal reflux disease)   . Heart murmur   . History of blood transfusion 06/2005   "related to pneumonia"  . Hyperlipidemia   . Hypertension   . Hypothyroidism   . Kidney stones   . Lung cancer (HCC)   . Mitral valve prolapse    "no ORs" (07/28/2015)  . Osteopenia   . Pneumonia 1970's; 06/2005  . Sleep apnea dx'd 06/2015   "haven't had followup appt yet" 07/28/2015)  . Squamous cell skin cancer     Tobacco Use: History  Smoking Status  . Former Smoker   . Packs/day: 0.00  . Years: 43.00  . Types: Cigarettes  Smokeless Tobacco  . Never Used    Labs: Recent Review Flowsheet Data    Labs for ITP Cardiac and Pulmonary Rehab Latest Ref Rng & Units 12/06/2009 12/27/2009 12/29/2009 01/16/2014 04/06/2016   Cholestrol 125 - 200 mg/dL - - - 184 194   LDLCALC <130 mg/dL - - - 97 86   HDL >=46 mg/dL - - - 58 62   Trlycerides <150 mg/dL - - - 143 232(H)   PHART 7.350 - 7.400 - 7.438(H) 7.390 - -   PCO2ART 35.0 - 45.0 mmHg - 37.1 40.9 - -   HCO3 20.0 - 24.0 mEq/L - 24.7(H) 24.7(H) - -   TCO2 0 - 100 mmol/L 27 25.8 26 - -   ACIDBASEDEF 0.0 - 2.0 mmol/L - - - - -   O2SAT % - 82.2 89.0 - -      Capillary Blood Glucose: Lab Results  Component Value Date   GLUCAP 106 (H) 01/02/2010   GLUCAP 124 (H) 01/01/2010   GLUCAP 132 (H) 01/01/2010   GLUCAP 126 (H) 01/01/2010   GLUCAP 114 (H) 01/01/2010     ADL UCSD:   Pulmonary Function Assessment:     Pulmonary Function Assessment - 07/28/16 1423      Breath   Bilateral Breath Sounds Clear   Shortness of Breath Yes;Limiting activity      Exercise Target Goals:    Exercise Program Goal: Individual exercise prescription set with THRR, safety & activity barriers. Participant demonstrates ability to understand and report RPE using BORG scale, to self-measure pulse accurately, and to acknowledge the importance of the exercise prescription.  Exercise Prescription Goal: Starting with aerobic activity 30 plus minutes a day, 3 days per week for initial exercise prescription. Provide home exercise prescription and guidelines that participant acknowledges understanding prior to discharge.  Activity Barriers & Risk Stratification:     Activity Barriers & Cardiac Risk Stratification - 07/28/16 1421      Activity Barriers & Cardiac Risk Stratification   Activity Barriers Deconditioning;Shortness of Breath      6 Minute Walk:     6 Minute Walk    Row Name 08/11/16 0838         6 Minute  Walk   Phase Initial     Distance 1563 feet     Walk Time 6 minutes     # of Rest Breaks 0     MPH 2.96     METS 2.2     RPE 14     Perceived Dyspnea  3     Symptoms No     Resting HR 87 bpm     Resting BP 150/81       Max Ex. HR 108 bpm     Max Ex. BP 198/80  2 days into new BP medicine     2 Minute Post BP 150/74       Interval HR   Baseline HR 87     1 Minute HR 91     2 Minute HR 108     3 Minute HR 108     4 Minute HR 95     5 Minute HR 107     6 Minute HR 104     2 Minute Post HR 93     Interval Heart Rate? Yes       Interval Oxygen   Interval Oxygen? Yes     Baseline Oxygen Saturation % 96 %     Baseline Liters of Oxygen 0 L     1 Minute Oxygen Saturation % 93 %     1 Minute Liters of Oxygen 0 L     2 Minute Oxygen Saturation % 96 %     2 Minute Liters of Oxygen 0 L     3 Minute Oxygen Saturation % 96 %     3 Minute Liters of Oxygen 0 L     4 Minute Oxygen Saturation % 93 %     4 Minute Liters of Oxygen 0 L     5 Minute Oxygen Saturation % 92 %     5 Minute Liters of Oxygen 0 L     6 Minute Oxygen Saturation % 90 %     6 Minute Liters of Oxygen 0 L     2 Minute Post Oxygen Saturation % 93 %     2 Minute Post Liters of Oxygen 0 L        Initial Exercise Prescription:     Initial Exercise Prescription - 08/11/16 0800      Date of Initial Exercise RX and Referring Provider   Date 08/11/16   Referring Provider Dr. Yacoub     NuStep   Level 2   Minutes 17   METs 1.7     Rower   Level 2   Minutes 17     Track   Laps 10   Minutes 17     Prescription Details   Frequency (times per week) 2   Duration Progress to 45 minutes of aerobic exercise without signs/symptoms of physical distress     Intensity   THRR 40-80% of Max Heartrate 58-117   Ratings of Perceived Exertion 11-13   Perceived Dyspnea 0-4     Progression   Progression Continue progressive overload as per policy without signs/symptoms or physical distress.     Resistance Training    Training Prescription Yes   Weight blue bands   Reps 10-12      Perform Capillary Blood Glucose checks as needed.  Exercise Prescription Changes:     Exercise Prescription Changes    Row Name 08/15/16 1200 08/17/16 1200 08/22/16 1200 08/29/16 1200 09/05/16 1232     Exercise Review   Progression  -  - Yes Yes Yes     Response to Exercise   Blood Pressure (Admit) 112/60 114/64 98/60 114/62 100/60   Blood Pressure (Exercise) 126/70 140/82 120/72 116/70 144/88   Blood Pressure (Exit) 102/50 116/60 108/60 118/60 104/60   Heart Rate (Admit) 79 bpm 81 bpm 78 bpm 80 bpm 81 bpm   Heart Rate (Exercise) 94 bpm 92 bpm 91 bpm 93 bpm 96 bpm   Heart Rate (  Exit) 84 bpm 81 bpm 73 bpm 88 bpm 83 bpm   Oxygen Saturation (Admit) 94 % 97 % 95 % 96 % 97 %   Oxygen Saturation (Exercise) 95 % 95 % 94 % 93 % 92 %   Oxygen Saturation (Exit) 95 % 97 % 97 % 99 % 98 %   Rating of Perceived Exertion (Exercise) _0 Perceived Dyspnea (Exercise) _1 Duration Progress to 45 minutes of aerobic exercise without signs/symptoms of physical distress Progress to 45 minutes of aerobic exercise without signs/symptoms of physical distress Progress to 45 minutes of aerobic exercise without signs/symptoms of physical distress Progress to 45 minutes of aerobic exercise without signs/symptoms of physical distress Progress to 45 minutes of aerobic exercise without signs/symptoms of physical distress   Intensity -  40-80% HRR THRR unchanged THRR unchanged THRR unchanged THRR unchanged     Progression   Progression Continue to progress workloads to maintain intensity without signs/symptoms of physical distress. Continue to progress workloads to maintain intensity without signs/symptoms of physical distress. Continue to progress workloads to maintain intensity without signs/symptoms of physical distress. Continue to progress workloads to maintain intensity without signs/symptoms of physical distress. Continue  to progress workloads to maintain intensity without signs/symptoms of physical distress.     Resistance Training   Training Prescription _2    Weight _3    Reps 10-12  10 minutes of strength training 10-12  10 mi utes of strength training 10-12  10 minutes of strength training 10-12  10 minutes of strength training 10-12  10 minutes of strength training     Interval Training   Interval Training _4      NuStep   Level 3  - _5 Minutes 17  - _6 METs 2.4  - 2.6 2.9 3.1     Rower   Level 1 2 3._7 Minutes _8 Track   Laps _9 Minutes _10 Row Name 09/07/16 1200 09/14/16 1200 09/19/16 1200 09/21/16 1200       Exercise Review   Progression  - Yes  -  -      Response to Exercise   Blood Pressure (Admit) 110/64 110/64 140/70 122/70    Blood Pressure (Exercise) 122/60 124/76 100/60 120/70    Blood Pressure (Exit) 114/60 110/60 100/56 108/58    Heart Rate (Admit) 77 bpm 74 bpm 78 bpm 75 bpm    Heart Rate (Exercise) 90 bpm 83 bpm 89 bpm 91 bpm    Heart Rate (Exit) 86 bpm 77 bpm 80 bpm 75 bpm    Oxygen Saturation (Admit) 97 % 97 % 99 % 98 %    Oxygen Saturation (Exercise) 94 % 97 % 96 % 95 %    Oxygen Saturation (Exit) 95 % 97 % 97 % 95 %    Rating of Perceived Exertion (Exercise) _11 Perceived Dyspnea (Exercise) 3 1 2.5 2    Duration Progress to 45 minutes of aerobic exercise without signs/symptoms of physical distress Progress to 45 minutes of aerobic exercise without signs/symptoms of physical distress Progress to 45 minutes of aerobic exercise without signs/symptoms of physical distress Progress to 45 minutes of aerobic  exercise without signs/symptoms of physical distress    Intensity THRR unchanged THRR unchanged THRR unchanged THRR unchanged      Progression   Progression Continue to progress workloads to maintain intensity without  signs/symptoms of physical distress. Continue to progress workloads to maintain intensity without signs/symptoms of physical distress. Continue to progress workloads to maintain intensity without signs/symptoms of physical distress. Continue to progress workloads to maintain intensity without signs/symptoms of physical distress.      Resistance Training   Training Prescription Yes Yes Yes Yes    Weight blue bands blue bands blue bands blue bands    Reps 10-12  10 minutes of strength training 10-12  10 minutes of strength training 10-12  10 minutes of strength training 10-12  10 minutes of strength training      Interval Training   Interval Training No No No No      NuStep   Level _0 Minutes 7  left early 17  left early 6 17    METs  - 3.3 3.3 3.1      Rower   Level  - 5 5  -    Minutes  - 17 17  -      Track   Laps 15  - 8 15    Minutes 17  - 17 [place] to continue exercise at  - Home  -  -    Frequency  - Add 3 additional days to program exercise sessions.  -  -       Exercise Comments:     Exercise Comments    Row Name 08/29/16 0815 09/14/16 1349 10/02/16 1632       Exercise Comments Patient has only attended three exercise sessions. Will cont. to monitor and progress. Home exercise completed Patient is progressing well in her exercise. She is always looking to progress her workloads. MET level places her at a moderate workload.        Discharge Exercise Prescription (Final Exercise Prescription Changes):     Exercise Prescription Changes - 09/21/16 1200      Response to Exercise   Blood Pressure (Admit) 122/70   Blood Pressure (Exercise) 120/70   Blood Pressure (Exit) 108/58   Heart Rate (Admit) 75 bpm   Heart Rate (Exercise) 91 bpm   Heart Rate (Exit) 75 bpm   Oxygen Saturation (Admit) 98 %   Oxygen Saturation (Exercise) 95 %   Oxygen Saturation (Exit) 95 %   Rating of Perceived Exertion (Exercise) 13   Perceived  Dyspnea (Exercise) 2   Duration Progress to 45 minutes of aerobic exercise without signs/symptoms of physical distress   Intensity THRR unchanged     Progression   Progression Continue to progress workloads to maintain intensity without signs/symptoms of physical distress.     Resistance Training   Training Prescription Yes   Weight blue bands   Reps 10-12  10 minutes of strength training     Interval Training   Interval Training No     NuStep   Level 5   Minutes 17   METs 3.1     Track   Laps 15   Minutes 17      Nutrition:  Target Goals: Understanding of nutrition guidelines, daily intake of sodium <1575m, cholesterol <2055m calories 30% from fat and 7% or less from saturated fats, daily to have 5 or more servings of fruits  and vegetables.  Biometrics:    Nutrition Therapy Plan and Nutrition Goals:   Nutrition Discharge: Rate Your Plate Scores:   Psychosocial: Target Goals: Acknowledge presence or absence of depression, maximize coping skills, provide positive support system. Participant is able to verbalize types and ability to use techniques and skills needed for reducing stress and depression.  Initial Review & Psychosocial Screening:     Initial Psych Review & Screening - 07/28/16 1425      Initial Review   Current issues with History of Depression;Current Stress Concerns   Comments chronic illness of husban     Family Dynamics   Good Support System? Yes     Barriers   Psychosocial barriers to participate in program Psychosocial barriers identified (see note)     Screening Interventions   Interventions Encouraged to exercise      Quality of Life Scores:   PHQ-9: Recent Review Flowsheet Data    Depression screen [facility] 2/9 07/28/2016 07/26/2015 12/01/2013   Decreased Interest 0 0 0   Down, Depressed, Hopeless 0 0 0   PHQ - 2 Score 0 0 0   Altered sleeping - 0 -   Tired, decreased energy - 0 -   Change in appetite - 0 -   Feeling bad or  failure about yourself  - 0 -   Trouble concentrating - 0 -   Moving slowly or fidgety/restless - 0 -   Suicidal thoughts - 0 -   PHQ-9 Score - 0 -   Difficult doing work/chores - Not difficult at all -      Psychosocial Evaluation and Intervention:     Psychosocial Evaluation - 07/28/16 1428      Psychosocial Evaluation & Interventions   Interventions Encouraged to exercise with the program and follow exercise prescription      Psychosocial Re-Evaluation:     Psychosocial Re-Evaluation    [place] Name 09/05/16 0827 10/03/16 0831           Psychosocial Re-Evaluation   Interventions Encouraged to attend Pulmonary Rehabilitation for the exercise Encouraged to attend Pulmonary Rehabilitation for the exercise      Comments no psychosocial barriers identified in the past 30 days see ITP for psychosocial barrier         Education: Education Goals: Education classes will be provided on a weekly basis, covering required topics. Participant will state understanding/return demonstration of topics presented.  Learning Barriers/Preferences:     Learning Barriers/Preferences - 07/28/16 1422      Learning Barriers/Preferences   Learning Preferences Group Instruction;Written Material;Individual Instruction      Education Topics: How Lungs Work and Diseases: - Discuss the anatomy of the lungs and diseases that can affect the lungs, such as COPD.   Exercise: -Discuss the importance of exercise, FITT principles of exercise, normal and abnormal responses to exercise, and how to exercise safely.   Environmental Irritants: -Discuss types of environmental irritants and how to limit exposure to environmental irritants.   Meds/Inhalers and oxygen: - Discuss respiratory medications, definition of an inhaler and oxygen, and the proper way to use an inhaler and oxygen.   Energy Saving Techniques: - Discuss methods to conserve energy and decrease shortness of breath when performing  activities of daily living.    Bronchial Hygiene / Breathing Techniques: - Discuss breathing mechanics, pursed-lip breathing technique,  proper posture, effective ways to clear airways, and other functional breathing techniques   Cleaning Equipment: - Provides group verbal and written instruction about the health risks  of elevated stress, cause of high stress, and healthy ways to reduce stress.   Nutrition I: Fats: - Discuss the types of cholesterol, what cholesterol does to the body, and how cholesterol levels can be controlled.   Nutrition II: Labels: -Discuss the different components of food labels and how to read food labels.   Respiratory Infections: - Discuss the signs and symptoms of respiratory infections, ways to prevent respiratory infections, and the importance of seeking medical treatment when having a respiratory infection.   Stress I: Signs and Symptoms: - Discuss the causes of stress, how stress may lead to anxiety and depression, and ways to limit stress.   Stress II: Relaxation: -Discuss relaxation techniques to limit stress.   Oxygen for Home/Travel: - Discuss how to prepare for travel when on oxygen and proper ways to transport and store oxygen to ensure safety.   Knowledge Questionnaire Score:   Core Components/Risk Factors/Patient Goals at Admission:     Personal Goals and Risk Factors at Admission - 07/28/16 1424      Core Components/Risk Factors/Patient Goals on Admission   Improve shortness of breath with ADL's Yes   Intervention Provide education, individualized exercise plan and daily activity instruction to help decrease symptoms of SOB with activities of daily living.   Expected Outcomes Short Term: Achieves a reduction of symptoms when performing activities of daily living.   Hypertension Yes   Intervention Provide education on lifestyle modifcations including regular physical activity/exercise, weight management, moderate sodium  restriction and increased consumption of fresh fruit, vegetables, and low fat dairy, alcohol moderation, and smoking cessation.;Monitor prescription use compliance.   Expected Outcomes Short Term: Continued assessment and intervention until BP is < 140/29m HG in hypertensive participants. < 130/85mHG in hypertensive participants with diabetes, heart failure or chronic kidney disease.;Long Term: Maintenance of blood pressure at goal levels.      Core Components/Risk Factors/Patient Goals Review:      Goals and Risk Factor Review    Row Name 09/05/16 0825 10/03/16 0831           Core Components/Risk Factors/Patient Goals Review   Personal Goals Review Improve shortness of breath with ADL's Improve shortness of breath with ADL's;Hypertension      Review see comments section on ITP see comments section on ITP      Expected Outcomes see Admission outcomes see Admission outcomes         Core Components/Risk Factors/Patient Goals at Discharge (Final Review):      Goals and Risk Factor Review - 10/03/16 0831      Core Components/Risk Factors/Patient Goals Review   Personal Goals Review Improve shortness of breath with ADL's;Hypertension   Review see comments section on ITP   Expected Outcomes see Admission outcomes      ITP Comments:   Comments: ITP REVIEW Pt is making expected progress toward pulmonary rehab goals after completing 9 sessions. She does have psychosocial barriers that may prevent her from progressing in the program as she should. 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 He has significant pain issues and decreased cognitive abilities. She is also dealing with an adult son and his addictions. She has not gone into depth about her issues but according to her husband she is under a significant amount of stress. Will attempt to discuss at today's session and offer counseling.  Recommend continued exercise, life style modification, education,  and utilization of breathing techniques to increase stamina and strength and  decrease shortness of breath with exertion.

## 2016-10-03 NOTE — Progress Notes (Signed)
Daily Session Note  Patient Details  Name: [name] MRN: [id number] Date of Birth: Jan 02, 1941 Referring Provider:   April Manson Pulmonary Rehab Walk Test from 08/10/2016 in [place]  Referring Provider  Dr. Nelda Marseille      Encounter Date: 10/03/2016  Check In:     Session Check In - 10/03/16 1024      Check-In   Location [facility]   Staff Present Rosebud Poles, RN, BSN;Molly diVincenzo, MS, ACSM RCEP, Exercise Physiologist;Lisa Ysidro Evert, RN;Portia Rollene Rotunda, RN, BSN   Supervising physician immediately available to respond to emergencies [facility] immediately available   Physician(s) Dr. Wyline Copas   Medication changes reported     No   Fall or balance concerns reported    No   Warm-up and Cool-down Performed as group-led instruction   Resistance Training Performed Yes   VAD Patient? No     Pain Assessment   Currently in Pain? No/denies   Multiple Pain Sites No      Capillary Blood Glucose: No results found for this or any previous visit (from the past 24 hour(s)).      Exercise Prescription Changes - 10/03/16 1200      Response to Exercise   Blood Pressure (Admit) 118/50   Blood Pressure (Exercise) 104/64   Blood Pressure (Exit) 110/62   Heart Rate (Admit) 73 bpm   Heart Rate (Exercise) 87 bpm   Oxygen Saturation (Admit) 97 %   Oxygen Saturation (Exercise) 95 %   Rating of Perceived Exertion (Exercise) 11   Perceived Dyspnea (Exercise) 2   Duration Progress to 45 minutes of aerobic exercise without signs/symptoms of physical distress   Intensity THRR unchanged     Progression   Progression Continue to progress workloads to maintain intensity without signs/symptoms of physical distress.     Resistance Training   Training Prescription Yes   Weight blue bands   Reps 10-12  10 minutes of strength training     Interval Training   Interval Training No     NuStep   Level 6   Minutes 17     Rower   Level 6   Minutes 17     Track   Laps 8   Minutes 17     Goals Met:  Exercise tolerated well Strength training completed today  Goals Unmet:  Not Applicable  Comments: Service time is from 1030 to 1200    Dr. Rush Farmer is Medical Director for Pulmonary Rehab at [facility].

## 2016-10-03 NOTE — Progress Notes (Signed)
[name] 76 y.o. female Nutrition Note Spoke with pt. Pt is overweight. There are some ways the pt can make her eating habits healthier. Pt's Rate Your Plate results reviewed with pt. Pt is trying to avoid most salty food; uses mostly fresh/frozen fruit and vegetables. The role of sodium in lung disease reviewed with pt. Pt expressed understanding of the information reviewed via feedback method.    No results found for: HGBA1C  Nutrition Diagnosis ? Food-and nutrition-related knowledge deficit related to lack of exposure to information as related to diagnosis of pulmonary disease ? Overweight related to excessive energy intake as evidenced by a BMI of 26.1 Nutrition Intervention ? Pt's individual nutrition plan and goals reviewed with pt. ? Benefits of adopting healthy eating habits discussed when pt's Rate Your Plate reviewed. ? Pt to attend the Nutrition and Lung Disease class - met 09/21/16 ? Continual client-centered nutrition education by RD, as part of interdisciplinary care. Goal(s) 1. Identify food quantities necessary to achieve wt loss of  -2# per week to a goal wt loss of 6-9 lb at graduation from pulmonary rehab. Monitor and Evaluate progress toward nutrition goal with team.   Derek Mound, M.Ed, RD, LDN, CDE 10/03/2016 12:12 PM

## 2016-10-05 ENCOUNTER — Encounter (HOSPITAL_COMMUNITY)
Admission: RE | Admit: 2016-10-05 | Discharge: 2016-10-05 | Disposition: A | Discharge status: Home / Self Care | Payer: Medicare Other | Source: Ambulatory Visit | Attending: Pulmonary Disease | Admitting: Pulmonary Disease

## 2016-10-05 VITALS — Wt 160.9 lb

## 2016-10-05 DIAGNOSIS — J439 Emphysema, unspecified: Secondary | ICD-10-CM | POA: Diagnosis not present

## 2016-10-05 NOTE — Progress Notes (Addendum)
Daily Session Note  Patient Details  Name: [name] MRN: [id number] Date of Birth: 10-02-1940 Referring Provider:   April Manson Pulmonary Rehab Walk Test from 08/10/2016 in [place]  Referring Provider  Dr. Nelda Marseille      Encounter Date: 10/05/2016  Check In:     Session Check In - 10/05/16 1030      Check-In   Location [facility]   Staff Present Su Hilt, MS, ACSM RCEP, Exercise Physiologist;Lisa Ysidro Evert, Felipe Drone, RN, MHA;Portia Rollene Rotunda, RN, BSN   Supervising physician immediately available to respond to emergencies [facility] immediately available   Physician(s) Dr. Algis Liming   Medication changes reported     No   Fall or balance concerns reported    No   Warm-up and Cool-down Performed as group-led instruction   Resistance Training Performed Yes   VAD Patient? No     Pain Assessment   Currently in Pain? No/denies   Multiple Pain Sites No      Capillary Blood Glucose: No results found for this or any previous visit (from the past 24 hour(s)).      Exercise Prescription Changes - 10/05/16 1200      Response to Exercise   Blood Pressure (Admit) 100/56   Blood Pressure (Exercise) 148/70   Blood Pressure (Exit) 114/60   Heart Rate (Admit) 75 bpm   Heart Rate (Exercise) 88 bpm   Heart Rate (Exit) 81 bpm   Oxygen Saturation (Admit) 95 %   Oxygen Saturation (Exercise) 96 %   Oxygen Saturation (Exit) 96 %   Rating of Perceived Exertion (Exercise) 12   Perceived Dyspnea (Exercise) 1   Duration Progress to 45 minutes of aerobic exercise without signs/symptoms of physical distress   Intensity THRR unchanged     Progression   Progression Continue to progress workloads to maintain intensity without signs/symptoms of physical distress.     Resistance Training   Training Prescription Yes   Weight blue bands   Reps 10-12  10 minutes of strength training     Interval Training   Interval Training No     NuStep   Level 6   Minutes 17   METs 3.3     Rower   Level 6   Minutes 17     Goals Met:  Exercise tolerated well No report of cardiac concerns or symptoms Strength training completed today  Goals Unmet:  Not Applicable  Comments: Service time is from 10:30a to 12:05p    Dr. Rush Farmer is Medical Director for Pulmonary Rehab at [facility].

## 2016-10-10 ENCOUNTER — Encounter (HOSPITAL_COMMUNITY)
Admission: RE | Admit: 2016-10-10 | Discharge: 2016-10-10 | Disposition: A | Discharge status: Home / Self Care | Payer: Medicare Other | Source: Ambulatory Visit | Attending: Pulmonary Disease | Admitting: Pulmonary Disease

## 2016-10-10 VITALS — Wt 160.1 lb

## 2016-10-10 DIAGNOSIS — J439 Emphysema, unspecified: Secondary | ICD-10-CM

## 2016-10-10 NOTE — Progress Notes (Signed)
Daily Session Note  Patient Details  Name: [name] MRN: [id number] Date of Birth: Oct 12, 1940 Referring Provider:   April Manson Pulmonary Rehab Walk Test from 08/10/2016 in [place]  Referring Provider  Dr. Nelda Marseille      Encounter Date: 10/10/2016  Check In:     Session Check In - 10/10/16 1208      Check-In   [facility]   Staff Present Su Hilt, MS, ACSM RCEP, Exercise Physiologist;Portia Rollene Rotunda, RN, BSN;Lisa Ysidro Evert, RN;Joan Leonia Reeves, RN, BSN   Supervising physician immediately available to respond to emergencies [facility] immediately available   Physician(s) Dr. Posey Pronto   Medication changes reported     No   Fall or balance concerns reported    No   Warm-up and Cool-down Performed as group-led instruction   Resistance Training Performed Yes   VAD Patient? No     Pain Assessment   Currently in Pain? No/denies   Multiple Pain Sites No      Capillary Blood Glucose: No results found for this or any previous visit (from the past 24 hour(s)).      Exercise Prescription Changes - 10/10/16 1200      Response to Exercise   Blood Pressure (Admit) 98/70   Blood Pressure (Exercise) 122/70   Blood Pressure (Exit) 102/60   Heart Rate (Admit) 71 bpm   Heart Rate (Exercise) 86 bpm   Heart Rate (Exit) 70 bpm   Oxygen Saturation (Admit) 97 %   Oxygen Saturation (Exercise) 94 %   Oxygen Saturation (Exit) 99 %   Rating of Perceived Exertion (Exercise) 13   Perceived Dyspnea (Exercise) 1   Duration Progress to 45 minutes of aerobic exercise without signs/symptoms of physical distress   Intensity THRR unchanged     Progression   Progression Continue to progress workloads to maintain intensity without signs/symptoms of physical distress.     Resistance Training   Training Prescription Yes   Weight blue bands   Reps 10-12  10 minutes of strength training     Interval Training   Interval  Training No     NuStep   Level 6   Minutes 17   METs 3     Rower   Level 6   Minutes 17     Track   Laps 13   Minutes 17     Goals Met:  Exercise tolerated well No report of cardiac concerns or symptoms Strength training completed today  Goals Unmet:  Not Applicable  Comments: Service time is from 10:30a to 12:05p    Dr. Rush Farmer is Medical Director for Pulmonary Rehab at [facility].

## 2016-10-12 ENCOUNTER — Encounter (HOSPITAL_COMMUNITY)
Admission: RE | Admit: 2016-10-12 | Discharge: 2016-10-12 | Disposition: A | Discharge status: Home / Self Care | Payer: Medicare Other | Source: Ambulatory Visit | Attending: Pulmonary Disease | Admitting: Pulmonary Disease

## 2016-10-12 VITALS — Wt 158.1 lb

## 2016-10-12 DIAGNOSIS — J439 Emphysema, unspecified: Secondary | ICD-10-CM | POA: Insufficient documentation

## 2016-10-12 NOTE — Progress Notes (Signed)
Daily Session Note  Patient Details  Name: [name] MRN: [id number] Date of Birth: 1941/08/30 Referring Provider:   April Manson Pulmonary Rehab Walk Test from 08/10/2016 in [place]  Referring Provider  Dr. Nelda Marseille      Encounter Date: 10/12/2016  Check In:     Session Check In - 10/12/16 1030      Check-In   Location [facility]   Staff Present Rosebud Poles, RN, BSN;Maelys Kinnick Ysidro Evert, RN;Portia Rollene Rotunda, RN, BSN   Supervising physician immediately available to respond to emergencies [facility] immediately available   Physician(s) Dr. Broadus John   Medication changes reported     No   Fall or balance concerns reported    No   Warm-up and Cool-down Performed as group-led instruction   Resistance Training Performed Yes   VAD Patient? No     Pain Assessment   Currently in Pain? No/denies   Multiple Pain Sites No      Capillary Blood Glucose: No results found for this or any previous visit (from the past 24 hour(s)).      Exercise Prescription Changes - 10/12/16 1200      Response to Exercise   Blood Pressure (Admit) 104/70   Blood Pressure (Exercise) 140/80   Blood Pressure (Exit) 110/70   Heart Rate (Admit) 78 bpm   Heart Rate (Exercise) 95 bpm   Heart Rate (Exit) 81 bpm   Oxygen Saturation (Admit) 97 %   Oxygen Saturation (Exercise) 96 %   Oxygen Saturation (Exit) 98 %   Rating of Perceived Exertion (Exercise) 12   Perceived Dyspnea (Exercise) 1   Duration Progress to 45 minutes of aerobic exercise without signs/symptoms of physical distress   Intensity THRR unchanged     Progression   Progression Continue to progress workloads to maintain intensity without signs/symptoms of physical distress.     Resistance Training   Training Prescription Yes   Weight blue bands   Reps 10-12  10 minutes of strength training     Interval Training   Interval Training No     NuStep   Level 6   Minutes 17   METs  2.9     Track   Laps 14   Minutes 17     Goals Met:  Exercise tolerated well No report of cardiac concerns or symptoms Strength training completed today  Goals Unmet:  Not Applicable  Comments: Service time is from 1030 to 1230    Dr. Rush Farmer is Medical Director for Pulmonary Rehab at [facility].

## 2016-10-17 ENCOUNTER — Encounter (HOSPITAL_COMMUNITY)
Admission: RE | Admit: 2016-10-17 | Discharge: 2016-10-17 | Disposition: A | Discharge status: Home / Self Care | Payer: Medicare Other | Source: Ambulatory Visit | Attending: Pulmonary Disease | Admitting: Pulmonary Disease

## 2016-10-17 VITALS — Wt 158.7 lb

## 2016-10-17 DIAGNOSIS — J439 Emphysema, unspecified: Secondary | ICD-10-CM

## 2016-10-17 NOTE — Progress Notes (Signed)
Daily Session Note  Patient Details  Name: [name] MRN: [id number] Date of Birth: 1940-11-09 Referring Provider:   April Manson Pulmonary Rehab Walk Test from 08/10/2016 in [place]  Referring Provider  Dr. Nelda Marseille      Encounter Date: 10/17/2016  Check In:     Session Check In - 10/17/16 1030      Check-In   [facility]   Staff Present Rosebud Poles, RN, BSN;Lisa Ysidro Evert, RN;Portia Rollene Rotunda, RN, BSN;Molly diVincenzo, MS, ACSM RCEP, Exercise Physiologist   Supervising physician immediately available to respond to emergencies [facility] immediately available   Physician(s) Dr. Cathlean Sauer   Medication changes reported     No   Fall or balance concerns reported    No   Warm-up and Cool-down Performed as group-led instruction   Resistance Training Performed Yes     Pain Assessment   Currently in Pain? No/denies   Multiple Pain Sites No      Capillary Blood Glucose: No results found for this or any previous visit (from the past 24 hour(s)).      Exercise Prescription Changes - 10/17/16 1200      Response to Exercise   Blood Pressure (Admit) 98/54   Blood Pressure (Exercise) 128/70   Blood Pressure (Exit) 94/60   Heart Rate (Admit) 77 bpm   Heart Rate (Exercise) 93 bpm   Heart Rate (Exit) 76 bpm   Oxygen Saturation (Admit) 96 %   Oxygen Saturation (Exercise) 96 %   Oxygen Saturation (Exit) 96 %   Rating of Perceived Exertion (Exercise) 13   Perceived Dyspnea (Exercise) 2   Duration Progress to 45 minutes of aerobic exercise without signs/symptoms of physical distress   Intensity THRR unchanged     Progression   Progression Continue to progress workloads to maintain intensity without signs/symptoms of physical distress.     Resistance Training   Training Prescription Yes   Weight blue bands   Reps 10-12  10 minutes of strength training     Interval Training   Interval Training No     NuStep    Level 6   Minutes 17   METs 2.9     Rower   Level 6   Minutes 17     Track   Laps 13   Minutes 17     Goals Met:  Exercise tolerated well Strength training completed today  Goals Unmet:  Not Applicable  Comments: Service time is from 1030 to 1200    Dr. Rush Farmer is Medical Director for Pulmonary Rehab at [facility].

## 2016-10-19 ENCOUNTER — Encounter (HOSPITAL_COMMUNITY)
Admission: RE | Admit: 2016-10-19 | Discharge: 2016-10-19 | Disposition: A | Discharge status: Home / Self Care | Payer: Medicare Other | Source: Ambulatory Visit | Attending: Pulmonary Disease | Admitting: Pulmonary Disease

## 2016-10-19 DIAGNOSIS — J439 Emphysema, unspecified: Secondary | ICD-10-CM | POA: Diagnosis not present

## 2016-10-19 NOTE — Progress Notes (Signed)
Daily Session Note  Patient Details  Name: [name] MRN: [id number] Date of Birth: [birth year] Referring Provider:   April Manson Pulmonary Rehab Walk Test from 08/10/2016 in [place]  Referring Provider  Dr. Nelda Marseille      Encounter Date: 10/19/2016  Check In:     Session Check In - 10/19/16 1030      Check-In   Location [facility]   Staff Present Rosebud Poles, RN, BSN;Ilena Dieckman, MS, ACSM RCEP, Exercise Physiologist;Lisa Ysidro Evert, RN;Portia Rollene Rotunda, RN, BSN   Supervising physician immediately available to respond to emergencies [facility] immediately available   Physician(s) Dr. Algis Liming   Medication changes reported     No   Fall or balance concerns reported    No   Warm-up and Cool-down Performed as group-led instruction   Resistance Training Performed Yes   VAD Patient? No     Pain Assessment   Currently in Pain? No/denies   Multiple Pain Sites No      Capillary Blood Glucose: No results found for this or any previous visit (from the past 24 hour(s)).      Exercise Prescription Changes - 10/19/16 1200      Response to Exercise   Blood Pressure (Admit) 98/54   Blood Pressure (Exercise) 132/70   Blood Pressure (Exit) 118/70   Heart Rate (Admit) 67 bpm   Heart Rate (Exercise) 87 bpm   Heart Rate (Exit) 75 bpm   Oxygen Saturation (Admit) 98 %   Oxygen Saturation (Exercise) 97 %   Oxygen Saturation (Exit) 97 %   Rating of Perceived Exertion (Exercise) 10   Perceived Dyspnea (Exercise) 1   Duration Progress to 45 minutes of aerobic exercise without signs/symptoms of physical distress   Intensity THRR unchanged     Progression   Progression Continue to progress workloads to maintain intensity without signs/symptoms of physical distress.     Resistance Training   Training Prescription Yes   Weight blue bands   Reps 10-12  10 minutes of strength training     Interval Training   Interval  Training No     NuStep   Level 6   Minutes 17   METs 2.8     Rower   Level 6   Minutes 17     Goals Met:  Exercise tolerated well No report of cardiac concerns or symptoms Strength training completed today  Goals Unmet:  Not Applicable  Comments: Service time is from 10:30am to 12:40p.Patient attended education today with Dr. Nelda Marseille.    Dr. Rush Farmer is Medical Director for Pulmonary Rehab at [facility].

## 2016-10-24 ENCOUNTER — Encounter (HOSPITAL_COMMUNITY)
Admission: RE | Admit: 2016-10-24 | Discharge: 2016-10-24 | Disposition: A | Discharge status: Home / Self Care | Payer: Medicare Other | Source: Ambulatory Visit | Attending: Pulmonary Disease | Admitting: Pulmonary Disease

## 2016-10-24 VITALS — Wt 158.5 lb

## 2016-10-24 DIAGNOSIS — J439 Emphysema, unspecified: Secondary | ICD-10-CM | POA: Diagnosis not present

## 2016-10-24 NOTE — Progress Notes (Signed)
Daily Session Note  Patient Details  Name: [name] MRN: [id number] Date of Birth: 30-Sep-1940 Referring Provider:   April Manson Pulmonary Rehab Walk Test from 08/10/2016 in [place]  Referring Provider  Dr. Nelda Marseille      Encounter Date: 10/24/2016  Check In:     Session Check In - 10/24/16 1030      Check-In   Location [facility]   Staff Present Rosebud Poles, RN, BSN;Molly diVincenzo, MS, ACSM RCEP, Exercise Physiologist;Lisa Ysidro Evert, RN;Portia Rollene Rotunda, RN, BSN   Supervising physician immediately available to respond to emergencies [facility] immediately available   Physician(s) Dr. Posey Pronto   Medication changes reported     No   Fall or balance concerns reported    No   Warm-up and Cool-down Performed as group-led instruction   Resistance Training Performed Yes   VAD Patient? No     Pain Assessment   Currently in Pain? No/denies   Multiple Pain Sites No      Capillary Blood Glucose: No results found for this or any previous visit (from the past 24 hour(s)).      Exercise Prescription Changes - 10/24/16 1200      Response to Exercise   Blood Pressure (Admit) 96/66   Blood Pressure (Exercise) 128/60   Blood Pressure (Exit) 98/56   Heart Rate (Admit) 78 bpm   Heart Rate (Exercise) 100 bpm   Heart Rate (Exit) 92 bpm   Oxygen Saturation (Admit) 97 %   Oxygen Saturation (Exercise) 95 %   Oxygen Saturation (Exit) 98 %   Rating of Perceived Exertion (Exercise) 13   Perceived Dyspnea (Exercise) 2   Duration Progress to 45 minutes of aerobic exercise without signs/symptoms of physical distress   Intensity THRR unchanged     Progression   Progression Continue to progress workloads to maintain intensity without signs/symptoms of physical distress.     Resistance Training   Training Prescription Yes   Weight blue bands   Reps 10-12  10 minutes of strength training     Interval Training   Interval  Training No     NuStep   Level 6   Minutes 17   METs 3.1     Rower   Level 6   Minutes 17     Track   Laps 14   Minutes 17     Goals Met:  [place] with exercise equipment Using PLB without cueing & demonstrates good technique Exercise tolerated well No report of cardiac concerns or symptoms Strength training completed today  Goals Unmet:  Not Applicable  Comments: Service time is from 1030 to 1205    Dr. Rush Farmer is Medical Director for Pulmonary Rehab at [facility].

## 2016-10-25 DIAGNOSIS — D485 Neoplasm of uncertain behavior of skin: Secondary | ICD-10-CM | POA: Diagnosis not present

## 2016-10-26 ENCOUNTER — Encounter (HOSPITAL_COMMUNITY)
Admission: RE | Admit: 2016-10-26 | Discharge: 2016-10-26 | Disposition: A | Discharge status: Home / Self Care | Payer: Medicare Other | Source: Ambulatory Visit | Attending: Pulmonary Disease | Admitting: Pulmonary Disease

## 2016-10-26 DIAGNOSIS — J439 Emphysema, unspecified: Secondary | ICD-10-CM

## 2016-10-26 NOTE — Progress Notes (Signed)
Daily Session Note  Patient Details  Name: [name] MRN: [id number] Date of Birth: 1941-01-28 Referring Provider:   April Manson Pulmonary Rehab Walk Test from 08/10/2016 in [place]  Referring Provider  Dr. Nelda Marseille      Encounter Date: 10/26/2016  Check In:     Session Check In - 10/26/16 1030      Check-In   Location [facility]   Staff Present Rosebud Poles, RN, BSN;Lorie Melichar, MS, ACSM RCEP, Exercise Physiologist;Lisa Ysidro Evert, RN;Portia Rollene Rotunda, RN, BSN   Supervising physician immediately available to respond to emergencies [facility] immediately available   Physician(s) Dr. Algis Liming   Medication changes reported     No   Fall or balance concerns reported    No   Warm-up and Cool-down Performed as group-led instruction   Resistance Training Performed Yes   VAD Patient? No     Pain Assessment   Currently in Pain? No/denies   Multiple Pain Sites No      Capillary Blood Glucose: No results found for this or any previous visit (from the past 24 hour(s)).      Exercise Prescription Changes - 10/26/16 1200      Response to Exercise   Blood Pressure (Admit) 110/60   Blood Pressure (Exercise) 142/60   Blood Pressure (Exit) 112/70   Heart Rate (Admit) 72 bpm   Heart Rate (Exercise) 95 bpm   Heart Rate (Exit) 95 bpm   Oxygen Saturation (Admit) 98 %   Oxygen Saturation (Exercise) 94 %   Oxygen Saturation (Exit) 99 %   Rating of Perceived Exertion (Exercise) 12   Perceived Dyspnea (Exercise) 1   Duration Progress to 45 minutes of aerobic exercise without signs/symptoms of physical distress   Intensity THRR unchanged     Progression   Progression Continue to progress workloads to maintain intensity without signs/symptoms of physical distress.     Resistance Training   Training Prescription Yes   Weight blue bands   Reps 10-12  10 minutes of strength training     Interval Training   Interval  Training No     NuStep   Level --   Minutes --   METs --     Rower   Level 6   Minutes 17     Track   Laps 16   Minutes 17     Goals Met:  Exercise tolerated well No report of cardiac concerns or symptoms Strength training completed today  Goals Unmet:  Not Applicable  Comments: Service time is from 10:30am to 12:00p    Dr. Rush Farmer is Medical Director for Pulmonary Rehab at [facility].

## 2016-10-31 ENCOUNTER — Encounter (HOSPITAL_COMMUNITY)
Admission: RE | Admit: 2016-10-31 | Discharge: 2016-10-31 | Disposition: A | Discharge status: Home / Self Care | Payer: Medicare Other | Source: Ambulatory Visit | Attending: Pulmonary Disease | Admitting: Pulmonary Disease

## 2016-10-31 DIAGNOSIS — J439 Emphysema, unspecified: Secondary | ICD-10-CM | POA: Diagnosis not present

## 2016-10-31 NOTE — Progress Notes (Signed)
Daily Session Note  Patient Details  Name: [name] MRN: [id number] Date of Birth: [birth year] Referring Provider:   April Manson Pulmonary Rehab Walk Test from 08/10/2016 in [place]  Referring Provider  Dr. Nelda Marseille      Encounter Date: 10/31/2016  Check In:     Session Check In - 10/31/16 1030      Check-In   Location [facility]   Staff Present Rosebud Poles, RN, BSN;Kirin Pastorino, MS, ACSM RCEP, Exercise Physiologist;Lisa Ysidro Evert, RN;Portia Rollene Rotunda, RN, BSN   Supervising physician immediately available to respond to emergencies [facility] immediately available   Physician(s) Dr. Candiss Norse   Medication changes reported     No   Fall or balance concerns reported    No   Warm-up and Cool-down Performed as group-led instruction   Resistance Training Performed Yes   VAD Patient? No     Pain Assessment   Currently in Pain? No/denies   Multiple Pain Sites No      Capillary Blood Glucose: No results found for this or any previous visit (from the past 24 hour(s)).      Exercise Prescription Changes - 10/31/16 1200      Response to Exercise   Blood Pressure (Admit) 112/66   Blood Pressure (Exercise) 130/64   Blood Pressure (Exit) 100/54   Heart Rate (Admit) 82 bpm   Heart Rate (Exercise) 105 bpm   Heart Rate (Exit) 95 bpm   Oxygen Saturation (Admit) 98 %   Oxygen Saturation (Exercise) 94 %   Oxygen Saturation (Exit) 99 %   Rating of Perceived Exertion (Exercise) 12   Perceived Dyspnea (Exercise) 2   Duration Progress to 45 minutes of aerobic exercise without signs/symptoms of physical distress   Intensity THRR unchanged     Progression   Progression Continue to progress workloads to maintain intensity without signs/symptoms of physical distress.     Resistance Training   Training Prescription Yes   Weight blue bands   Reps 10-12  10 minutes of strength training     Interval Training   Interval  Training No     NuStep   Level 6   Minutes 17   METs 3.2     Rower   Level 6   Minutes 17     Track   Laps 17   Minutes 17     Goals Met:  Exercise tolerated well No report of cardiac concerns or symptoms Strength training completed today  Goals Unmet:  Not Applicable  Comments: Service time is from 10:30am to 12:05p    Dr. Rush Farmer is Medical Director for Pulmonary Rehab at [facility].

## 2016-11-02 ENCOUNTER — Encounter (HOSPITAL_COMMUNITY)
Admission: RE | Admit: 2016-11-02 | Discharge: 2016-11-02 | Disposition: A | Discharge status: Home / Self Care | Payer: Medicare Other | Source: Ambulatory Visit | Attending: Pulmonary Disease | Admitting: Pulmonary Disease

## 2016-11-02 VITALS — Wt 159.0 lb

## 2016-11-02 DIAGNOSIS — J439 Emphysema, unspecified: Secondary | ICD-10-CM

## 2016-11-02 NOTE — Progress Notes (Signed)
Daily Session Note  Patient Details  Name: KALYN HOFSTRA MRN: [id number] Date of Birth: 16-Aug-1941 Referring Provider:   April Manson Pulmonary Rehab Walk Test from 08/10/2016 in [place]  Referring Provider  Dr. Nelda Marseille      Encounter Date: 11/02/2016  Check In:     Session Check In - 11/02/16 1103      Check-In   Location [facility]   Staff Present Su Hilt, MS, ACSM RCEP, Exercise Physiologist;Portia Rollene Rotunda, RN, Roque Cash, RN   Supervising physician immediately available to respond to emergencies [facility] immediately available   Physician(s) Dr. Tana Coast   Medication changes reported     No   Fall or balance concerns reported    No   Warm-up and Cool-down Performed as group-led instruction   Resistance Training Performed Yes   VAD Patient? No     Pain Assessment   Currently in Pain? No/denies   Multiple Pain Sites No      Capillary Blood Glucose: No results found for this or any previous visit (from the past 24 hour(s)).      Exercise Prescription Changes - 11/02/16 1200      Response to Exercise   Blood Pressure (Admit) 108/60   Blood Pressure (Exercise) 140/76   Blood Pressure (Exit) 98/64   Heart Rate (Admit) 75 bpm   Heart Rate (Exercise) 93 bpm   Heart Rate (Exit) 85 bpm   Oxygen Saturation (Admit) 97 %   Oxygen Saturation (Exercise) 95 %   Oxygen Saturation (Exit) 99 %   Rating of Perceived Exertion (Exercise) 11   Perceived Dyspnea (Exercise) 2   Duration Progress to 45 minutes of aerobic exercise without signs/symptoms of physical distress   Intensity THRR unchanged     Progression   Progression Continue to progress workloads to maintain intensity without signs/symptoms of physical distress.     Resistance Training   Training Prescription Yes   Weight blue bands   Reps 10-12  10 minutes of strength training     Interval Training   Interval Training No     NuStep   Level 6   Minutes 17   METs 3.1     Rower   Level 6   Minutes 17     Track   Laps --   Minutes --     Goals Met:  Exercise tolerated well No report of cardiac concerns or symptoms Strength training completed today  Goals Unmet:  Not Applicable  Comments: Service time is from 10:30A to 12:15P    Dr. Rush Farmer is Medical Director for Pulmonary Rehab at [facility].

## 2016-11-03 ENCOUNTER — Ambulatory Visit (INDEPENDENT_AMBULATORY_CARE_PROVIDER_SITE_OTHER): Payer: Medicare Other | Admitting: Family Medicine

## 2016-11-03 ENCOUNTER — Encounter: Payer: Self-pay | Admitting: Family Medicine

## 2016-11-03 VITALS — BP 130/80 | HR 78 | Temp 98.4°F | Resp 20 | Ht 67.5 in | Wt 159.0 lb

## 2016-11-03 DIAGNOSIS — E7800 Pure hypercholesterolemia, unspecified: Secondary | ICD-10-CM | POA: Diagnosis not present

## 2016-11-03 DIAGNOSIS — R61 Generalized hyperhidrosis: Secondary | ICD-10-CM

## 2016-11-03 DIAGNOSIS — I10 Essential (primary) hypertension: Secondary | ICD-10-CM

## 2016-11-03 DIAGNOSIS — J449 Chronic obstructive pulmonary disease, unspecified: Secondary | ICD-10-CM

## 2016-11-03 LAB — CBC WITH DIFFERENTIAL/PLATELET
Basophils Absolute: 85 cells/uL (ref 0–200)
Basophils Relative: 1 %
Eosinophils Absolute: 170 cells/uL (ref 15–500)
Eosinophils Relative: 2 %
HCT: 45.3 % — ABNORMAL HIGH (ref 35.0–45.0)
Hemoglobin: 14.8 g/dL (ref 12.0–15.0)
Lymphocytes Relative: 26 %
Lymphs Abs: 2210 cells/uL (ref 850–3900)
MCH: 33.6 pg — ABNORMAL HIGH (ref 27.0–33.0)
MCHC: 32.7 g/dL (ref 32.0–36.0)
MCV: 102.7 fL — ABNORMAL HIGH (ref 80.0–100.0)
MPV: 10.3 fL (ref 7.5–12.5)
Monocytes Absolute: 850 cells/uL (ref 200–950)
Monocytes Relative: 10 %
Neutro Abs: 5185 cells/uL (ref 1500–7800)
Neutrophils Relative %: 61 %
Platelets: 321 10*3/uL (ref 140–400)
RBC: 4.41 MIL/uL (ref 3.80–5.10)
RDW: 13.3 % (ref 11.0–15.0)
WBC: 8.5 10*3/uL (ref 3.8–10.8)

## 2016-11-03 LAB — COMPLETE METABOLIC PANEL WITH GFR
ALT: 14 U/L (ref 6–29)
AST: 18 U/L (ref 10–35)
Albumin: 4.3 g/dL (ref 3.6–5.1)
Alkaline Phosphatase: 67 U/L (ref 33–130)
BUN: 20 mg/dL (ref 7–25)
CO2: 22 mmol/L (ref 20–31)
Calcium: 9.6 mg/dL (ref 8.6–10.4)
Chloride: 104 mmol/L (ref 98–110)
Creat: 0.83 mg/dL (ref 0.60–0.93)
GFR, Est African American: 80 mL/min (ref >=60)
GFR, Est Non African American: 70 mL/min (ref >=60)
Glucose, Bld: 95 mg/dL (ref 70–99)
Potassium: 4.3 mmol/L (ref 3.5–5.3)
Sodium: 137 mmol/L (ref 135–146)
Total Bilirubin: 0.5 mg/dL (ref 0.2–1.2)
Total Protein: 6.8 g/dL (ref 6.1–8.1)

## 2016-11-03 LAB — LIPID PANEL
Cholesterol: 220 mg/dL — ABNORMAL HIGH (ref <200)
HDL: 64 mg/dL (ref >50)
LDL Cholesterol: 115 mg/dL — ABNORMAL HIGH (ref <100)
Total CHOL/HDL Ratio: 3.4 Ratio (ref <5.0)
Triglycerides: 205 mg/dL — ABNORMAL HIGH (ref <150)
VLDL: 41 mg/dL — ABNORMAL HIGH (ref <30)

## 2016-11-03 MED ORDER — AMLODIPINE BESYLATE 10 MG PO TABS: 10.0000 mg | ORAL_TABLET | Freq: Every day | ORAL | 3 refills | Status: DC

## 2016-11-03 MED ORDER — ALPRAZOLAM 0.5 MG PO TABS: ORAL_TABLET | ORAL | 0 refills | Status: DC

## 2016-11-03 MED ORDER — PRAVASTATIN SODIUM 40 MG PO TABS: 40.0000 mg | ORAL_TABLET | Freq: Every day | ORAL | 3 refills | Status: DC

## 2016-11-03 MED ORDER — LOSARTAN POTASSIUM-HCTZ 100-25 MG PO TABS: 1.0000 | ORAL_TABLET | Freq: Every day | ORAL | 3 refills | Status: DC

## 2016-11-03 NOTE — Progress Notes (Signed)
Subjective:    Patient ID: [name], female    DOB: 1940-12-08, 76 y.o.   MRN: [id number]  Hypertension   Medication Refill     04/06/16 Patient is here today for complete physical exam. She has had a shingles vaccine as well as Pneumovax 23. Therefore all her immunizations are up-to-date including those listed below. She does not require a Pap smear. She is on a schedule her appointment for her mammogram. She is overdue for a bone density test that she has not had one since 2004. She is also not sure the last time she had a colonoscopy. She believes it may have been 2007. Although asymptomatic, she would therefore then be due for repeat colonoscopy. Her last colonoscopy was performed by Dr. Benson Norway.  Her blood pressure today is significantly elevated. She believes this is a spurious finding and she states that it is much better controlled at home.  At that time, my plan was: Physical exam today is normal except for her blood pressure. She will check her blood pressure everyday for one week and notify me of the values. She will schedule her mammogram. I will schedule a bone density test. I will also contact GI and verify when her next colonoscopy needs to be performed. I will check a CBC, CMP, fasting lipid panel, and a TSH. With regards to her lung cancer, she is now 7 years diagnosis. She is currently 6 years out from her surgery. She has been stable and is now being followed up annually with her oncologist. She has done remarkably well  05/11/16 After the last visit, the patient was switched from zestoretic to hyzaar 100/25.  Here for follow up. Blood pressure has been averaging between 130 and 145/70-80 since changing the medication. She denies any chest pain or shortness of breath or dyspnea on exertion.  At that time, my plan was: Blood pressure is still slightly elevated however I would like the patient to try to loose 5-10 pounds and increase her aerobic exercise to see if we can get her  blood pressure better controlled rather than add more medication. Recheck in 6 months   08/07/16 Patient has lost approximate 5 or 6 pounds. She is exercising. However her blood pressures continue to run 086-578 systolic over 46-96. She has been unable to achieve the additional decline in blood pressure readings that we were hoping for.  At that time, my plan was: Add amlodipine 10 mg by mouth daily to achieve additional reduction in blood pressure. Discontinue simvastatin due to the interaction with amlodipine and replaced with pravastatin 40 mg by mouth daily. Recheck blood pressures in one month.  11/03/16 Patient is here today for follow-up of her blood pressure. At some point she is inadvertently started taking lisinopril hydrochlorothiazide and losartan hydrochlorothiazide. She is still taking amlodipine along with her pravastatin. She is due to recheck a fasting lipid panel. She denies any myalgias or right upper quadrant pain. Mammogram is up-to-date as is her bone density. Immunizations are up-to-date. She continues to complain of profuse sweating. She is on levothyroxine 100 g by mouth daily for hypothyroidism and she is due to recheck a TSH.  However, there has never been a problem with her dosage. Her blood pressure readings have been low however she is taking twice the medication she is supposed to. Her blood pressures at pulmonary rehabilitation are sometimes 295 systolic over 60 diastolic. She wears oxygen at night due to her severe COPD. She has a history  of lung cancer status post thoracotomy and lobectomy. She requires oxygen at night due to nocturnal hypoxemia. With oxygen at night however she sleeps relatively well. She is using Advair twice a day and is tolerating that well. She is not having exacerbation in more than 6 months.  Immunization History  Administered Date(s) Administered  . Influenza,inj,Quad PF,36+ Mos 05/29/2014, 05/30/2016  . Influenza-Unspecified 06/23/2015  .  Pneumococcal Conjugate-13 01/16/2014  . Pneumococcal Polysaccharide-23 07/03/2012  . Td 09/12/1993  . Tdap 12/21/2012  . Zoster 09/27/2007   Past Medical History:  Diagnosis Date  . Allergic rhinitis   . Anemia   . Anxiety   . Arrhythmia   . Arthritis    "hands, neck, right shoulder" (07/28/2015)  . Aseptic necrosis ([place])   . Asthma dxc'd 05/2015  . Basal cell cancer   . Bleeding stomach ulcer ~ 06/2005  . COPD (chronic obstructive pulmonary disease) ([place])   . Depression   . Diverticula of colon   . Esophagitis   . Gastric ulcer with hemorrhage   . GERD (gastroesophageal reflux disease)   . Heart murmur   . History of blood transfusion 06/2005   "related to pneumonia"  . Hyperlipidemia   . Hypertension   . Hypothyroidism   . Kidney stones   . Lung cancer ([place])   . Mitral valve prolapse    "no ORs" (07/28/2015)  . Osteopenia   . Pneumonia 1970's; 06/2005  . Sleep apnea dx'd 06/2015   "haven't had followup appt yet" 07/28/2015)  . Squamous cell skin cancer    Past Surgical History:  Procedure Laterality Date  . ABDOMINAL HYSTERECTOMY  1988  . HAMMER TOE SURGERY Right ~ 2010  . SALPINGOOPHORECTOMY Left 1988  . SKIN CANCER EXCISION     "I've had severl cut off RLE; left foreqrm, nose under nose"  . THORACOTOMY Left 06/2005  . THORACOTOMY/LOBECTOMY  09/2009; 11/2009   left; right   Current Outpatient Prescriptions on File Prior to Visit  Medication Sig Dispense Refill  . acetaminophen (TYLENOL) 325 MG tablet Take by mouth.    . ADVAIR DISKUS 250-50 MCG/DOSE AEPB USE 1 INHALATION TWICE A DAY 180 each 3  . albuterol (PROVENTIL) (2.5 MG/3ML) 0.083% nebulizer solution Take 3 mLs (2.5 mg total) by nebulization every 4 (four) hours as needed for wheezing or shortness of breath. 450 mL 3  . aspirin 81 MG tablet Take 81 mg by mouth every other day.     . b complex-C-folic acid 1 MG capsule Take 1 capsule by mouth daily.      Marland Kitchen buPROPion (WELLBUTRIN XL) 150 MG 24 hr tablet  TAKE 1 TABLET DAILY 90 tablet 3  . Calcium Carbonate-Vit D-Min (CALCIUM 1200 PO) Take by mouth.    . celecoxib (CELEBREX) 200 MG capsule TAKE 1 CAPSULE DAILY 90 capsule 2  . Cyanocobalamin 1000 MCG CAPS Take by mouth.    . diphenhydrAMINE (BENADRYL) 50 MG capsule Take 50 mg by mouth at bedtime as needed for sleep.    . DULoxetine (CYMBALTA) 60 MG capsule TAKE 1 CAPSULE DAILY 90 capsule 3  . fluticasone (FLONASE) 50 MCG/ACT nasal spray Place 2 sprays into both nostrils daily. 48 g 3  . levothyroxine (SYNTHROID, LEVOTHROID) 100 MCG tablet TAKE 1 TABLET EVERY MORNING 90 tablet 2  . loratadine (CLARITIN) 10 MG tablet TAKE 1 TABLET DAILY 90 tablet 3  . losartan-hydrochlorothiazide (HYZAAR) 100-25 MG tablet Take 1 tablet by mouth daily.     Marland Kitchen omeprazole (PRILOSEC) 20 MG  capsule TAKE 1 CAPSULE DAILY 90 capsule 1  . simvastatin (ZOCOR) 40 MG tablet Take 1 tablet (40 mg total) by mouth at bedtime. 90 tablet 3  . traZODone (DESYREL) 50 MG tablet Take 50 mg by mouth at bedtime as needed for sleep.     Marland Kitchen VITAMIN D, CHOLECALCIFEROL, PO Take 1 tablet by mouth daily.     No current facility-administered medications on file prior to visit.    Allergies  Allergen Reactions  . Doxycycline Nausea Only  . Erythromycin Nausea Only  . Sulfonamide Derivatives Hives   Social History   Social History  . Marital status: Married    Spouse name: N/A  . Number of children: 3  . Years of education: N/A   Occupational History  . retired Marine scientist Retired   Social History Main Topics  . Smoking status: Former Smoker    Packs/day: 0.00    Years: 43.00    Types: Cigarettes  . Smokeless tobacco: Never Used  . Alcohol use 4.2 oz/week    7 Shots of liquor per week  . Drug use: No  . Sexual activity: Yes   Other Topics Concern  . Not on file   Social History Narrative  . No narrative on file   Family History  Problem Relation Age of Onset  . Graves' disease Daughter   . Breast cancer Maternal Aunt   .  Heart disease Maternal Aunt   . Hypertension Mother   . Arthritis Maternal Grandmother      Review of Systems  All other systems reviewed and are negative.      Objective:   Physical Exam  Constitutional: She appears well-developed and well-nourished. No distress.  Neck: No JVD present.  Cardiovascular: Normal rate, regular rhythm, normal heart sounds and intact distal pulses.  Exam reveals no gallop and no friction rub.   No murmur heard. Pulmonary/Chest: Effort normal and breath sounds normal. No respiratory distress. She has no wheezes. She has no rales. She exhibits no tenderness.  Abdominal: Soft. Bowel sounds are normal. She exhibits no distension and no mass. There is no tenderness. There is no rebound and no guarding.  Musculoskeletal: She exhibits no edema.  Skin: She is not diaphoretic.  Vitals reviewed.         Assessment & Plan:  Pure hypercholesterolemia - Plan: pravastatin (PRAVACHOL) 40 MG tablet  Benign essential HTN - Plan: amLODipine (NORVASC) 10 MG tablet  Check fasting lipid panel. Goal LDL cholesterol is less than 100. Blood pressure today is at goal. I'll have the patient continue amlodipine and losartan hydrochlorothiazide. I want her to discontinue lisinopril hydrochlorothiazide as this is overmedication. I will check a TSH to monitor her dosage of levothyroxine. Immunizations and cancer screening are up-to-date

## 2016-11-07 ENCOUNTER — Encounter (HOSPITAL_COMMUNITY)
Admission: RE | Admit: 2016-11-07 | Discharge: 2016-11-07 | Disposition: A | Discharge status: Home / Self Care | Payer: Medicare Other | Source: Ambulatory Visit | Attending: Pulmonary Disease | Admitting: Pulmonary Disease

## 2016-11-07 ENCOUNTER — Other Ambulatory Visit: Payer: Self-pay

## 2016-11-07 DIAGNOSIS — J439 Emphysema, unspecified: Secondary | ICD-10-CM | POA: Diagnosis not present

## 2016-11-07 DIAGNOSIS — L570 Actinic keratosis: Secondary | ICD-10-CM | POA: Diagnosis not present

## 2016-11-07 NOTE — Progress Notes (Signed)
Daily Session Note  Patient Details  Name: ALONNAH LAMPKINS MRN: [id number] Date of Birth: [birth year] Referring Provider:   April Manson Pulmonary Rehab Walk Test from 08/10/2016 in [place]  Referring Provider  Dr. Nelda Marseille      Encounter Date: 11/07/2016  Check In:     Session Check In - 11/07/16 1232      Check-In   Medication changes reported     Yes   Comments stopped lisinopril      Capillary Blood Glucose: No results found for this or any previous visit (from the past 24 hour(s)).      Exercise Prescription Changes - 11/07/16 1200      Response to Exercise   Blood Pressure (Admit) 100/64   Blood Pressure (Exercise) 146/80   Blood Pressure (Exit) 100/64   Heart Rate (Admit) 77 bpm   Heart Rate (Exercise) 98 bpm   Heart Rate (Exit) 84 bpm   Oxygen Saturation (Admit) 98 %   Oxygen Saturation (Exercise) 94 %   Oxygen Saturation (Exit) 100 %   Rating of Perceived Exertion (Exercise) 12   Perceived Dyspnea (Exercise) 2   Duration Progress to 45 minutes of aerobic exercise without signs/symptoms of physical distress   Intensity THRR unchanged     Progression   Progression Continue to progress workloads to maintain intensity without signs/symptoms of physical distress.     Resistance Training   Training Prescription Yes   Weight blue bands   Reps 10-15   Time 10 Minutes     Interval Training   Interval Training No     NuStep   Level 6   Minutes 17   METs 3.8     Rower   Level 6   Minutes 17     Track   Laps 14   Minutes 17      History  Smoking Status  . Former Smoker  . Packs/day: 0.00  . Years: 43.00  . Types: Cigarettes  Smokeless Tobacco  . Never Used    Goals Met:  Exercise tolerated well No report of cardiac concerns or symptoms Strength training completed today  Goals Unmet:  Not Applicable  Comments: Service time is from 10:30a to 12:05p    Dr. Rush Farmer is Medical Director for  Pulmonary Rehab at [facility].

## 2016-11-09 ENCOUNTER — Encounter (HOSPITAL_COMMUNITY)
Admission: RE | Admit: 2016-11-09 | Discharge: 2016-11-09 | Disposition: A | Discharge status: Home / Self Care | Payer: Medicare Other | Source: Ambulatory Visit | Attending: Pulmonary Disease | Admitting: Pulmonary Disease

## 2016-11-09 VITALS — Wt 160.3 lb

## 2016-11-09 DIAGNOSIS — J439 Emphysema, unspecified: Secondary | ICD-10-CM | POA: Diagnosis not present

## 2016-11-09 NOTE — Progress Notes (Signed)
Daily Session Note  Patient Details  Name: [name] MRN: [id number] Date of Birth: 06-29-1941 Referring Provider:   April Manson Pulmonary Rehab Walk Test from 08/10/2016 in [place]  Referring Provider  Dr. Nelda Marseille      Encounter Date: 11/09/2016  Check In:     Session Check In - 11/09/16 1030      Check-In   Location [facility]   Staff Present Rosebud Poles, RN, BSN;Molly diVincenzo, MS, ACSM RCEP, Exercise Physiologist;Lisa Ysidro Evert, RN;Kameka Whan Rollene Rotunda, RN, BSN   Supervising physician immediately available to respond to emergencies [facility] immediately available   Physician(s) Dr. Sloan Leiter   Medication changes reported     No   Fall or balance concerns reported    No   Tobacco Cessation No Change   Warm-up and Cool-down Performed as group-led instruction   Resistance Training Performed Yes   VAD Patient? No     Pain Assessment   Currently in Pain? No/denies   Multiple Pain Sites No      Capillary Blood Glucose: No results found for this or any previous visit (from the past 24 hour(s)).      Exercise Prescription Changes - 11/09/16 1226      Response to Exercise   Blood Pressure (Admit) 136/84   Blood Pressure (Exercise) 148/76   Blood Pressure (Exit) 120/60   Heart Rate (Admit) 73 bpm   Heart Rate (Exercise) 91 bpm   Heart Rate (Exit) 77 bpm   Oxygen Saturation (Admit) 98 %   Oxygen Saturation (Exercise) 95 %   Oxygen Saturation (Exit) 97 %   Rating of Perceived Exertion (Exercise) 13   Perceived Dyspnea (Exercise) 3   Duration Progress to 45 minutes of aerobic exercise without signs/symptoms of physical distress   Intensity THRR unchanged     Progression   Progression Continue to progress workloads to maintain intensity without signs/symptoms of physical distress.     Resistance Training   Training Prescription Yes   Weight blue bands   Reps 10-15   Time 10 Minutes     Interval Training    Interval Training No     NuStep   Level 6   Minutes 17   METs 3.4     Rower   Level --   Minutes --     Track   Laps 15   Minutes 17      History  Smoking Status  . Former Smoker  . Packs/day: 0.00  . Years: 43.00  . Types: Cigarettes  Smokeless Tobacco  . Never Used    Goals Met:  Independence with exercise equipment Improved SOB with ADL's Using PLB without cueing & demonstrates good technique Exercise tolerated well No report of cardiac concerns or symptoms Strength training completed today  Goals Unmet:  Not Applicable  Comments: Service time is from 1030 to 1220   Dr. Rush Farmer is Medical Director for Pulmonary Rehab at [facility].

## 2016-11-14 ENCOUNTER — Encounter (HOSPITAL_COMMUNITY)
Admission: RE | Admit: 2016-11-14 | Discharge: 2016-11-14 | Disposition: A | Discharge status: Home / Self Care | Payer: Medicare Other | Source: Ambulatory Visit | Attending: Pulmonary Disease | Admitting: Pulmonary Disease

## 2016-11-14 VITALS — Wt 159.0 lb

## 2016-11-14 DIAGNOSIS — J439 Emphysema, unspecified: Secondary | ICD-10-CM

## 2016-11-14 NOTE — Progress Notes (Signed)
Daily Session Note  Patient Details  Name: [name] MRN: [id number] Date of Birth: 10-Apr-1941 Referring Provider:   April Manson Pulmonary Rehab Walk Test from 08/10/2016 in [place]  Referring Provider  Dr. Nelda Marseille      Encounter Date: 11/14/2016  Check In:     Session Check In - 11/14/16 1030      Check-In   Location [facility]   Staff Present Rosebud Poles, RN, BSN;Lisa Ysidro Evert, RN;Anicka Stuckert, MS, ACSM RCEP, Exercise Physiologist;Portia Rollene Rotunda, RN, BSN   Supervising physician immediately available to respond to emergencies [facility] immediately available   Physician(s) Dr. Candiss Norse   Medication changes reported     No   Fall or balance concerns reported    No   Tobacco Cessation No Change   Warm-up and Cool-down Performed as group-led instruction   Resistance Training Performed Yes   VAD Patient? No     Pain Assessment   Currently in Pain? No/denies   Multiple Pain Sites No      Capillary Blood Glucose: No results found for this or any previous visit (from the past 24 hour(s)).      Exercise Prescription Changes - 11/14/16 1200      Response to Exercise   Blood Pressure (Admit) 128/70   Blood Pressure (Exercise) 140/70   Blood Pressure (Exit) 120/64   Heart Rate (Admit) 78 bpm   Heart Rate (Exercise) 106 bpm   Heart Rate (Exit) 82 bpm   Oxygen Saturation (Admit) 97 %   Oxygen Saturation (Exercise) 97 %   Oxygen Saturation (Exit) 98 %   Rating of Perceived Exertion (Exercise) 13   Perceived Dyspnea (Exercise) 3   Duration Progress to 45 minutes of aerobic exercise without signs/symptoms of physical distress   Intensity THRR unchanged     Progression   Progression Continue to progress workloads to maintain intensity without signs/symptoms of physical distress.     Resistance Training   Training Prescription Yes   Weight blue bands   Reps 10-15   Time 10 Minutes     Interval Training    Interval Training No     NuStep   Level 6   Minutes 17   METs 3.9     Rower   Level 6   Minutes 17     Track   Laps 14   Minutes 17      History  Smoking Status  . Former Smoker  . Packs/day: 0.00  . Years: 43.00  . Types: Cigarettes  Smokeless Tobacco  . Never Used    Goals Met:  Exercise tolerated well No report of cardiac concerns or symptoms Strength training completed today  Goals Unmet:  Not Applicable  Comments: Service time is from 10:30am to 12:05p    Dr. Rush Farmer is Medical Director for Pulmonary Rehab at [facility].

## 2016-11-16 ENCOUNTER — Encounter (HOSPITAL_COMMUNITY)
Admission: RE | Admit: 2016-11-16 | Discharge: 2016-11-16 | Disposition: A | Discharge status: Home / Self Care | Payer: Medicare Other | Source: Ambulatory Visit | Attending: Pulmonary Disease | Admitting: Pulmonary Disease

## 2016-11-16 VITALS — Wt 158.1 lb

## 2016-11-16 DIAGNOSIS — J439 Emphysema, unspecified: Secondary | ICD-10-CM

## 2016-11-16 NOTE — Progress Notes (Signed)
Daily Session Note  Patient Details  Name: [name] MRN: [id number] Date of Birth: 03/03/1941 Referring Provider:   April Manson Pulmonary Rehab Walk Test from 08/10/2016 in [place]  Referring Provider  Dr. Nelda Marseille      Encounter Date: 11/16/2016  Check In:     Session Check In - 11/16/16 1030      Check-In   Location [facility]   Staff Present Rosebud Poles, RN, BSN   Supervising physician immediately available to respond to emergencies [facility] immediately available   Physician(s) Dr. Tana Coast   Medication changes reported     No   Fall or balance concerns reported    No   Tobacco Cessation No Change   Warm-up and Cool-down Performed as group-led instruction   Resistance Training Performed Yes   VAD Patient? No     Pain Assessment   Currently in Pain? No/denies   Multiple Pain Sites No      Capillary Blood Glucose: No results found for this or any previous visit (from the past 24 hour(s)).      Exercise Prescription Changes - 11/16/16 1300      Response to Exercise   Blood Pressure (Admit) 124/70   Blood Pressure (Exercise) 128/73   Blood Pressure (Exit) 120/68   Heart Rate (Admit) 74 bpm   Heart Rate (Exercise) 95 bpm   Heart Rate (Exit) 78 bpm   Oxygen Saturation (Admit) 97 %   Oxygen Saturation (Exercise) 95 %   Oxygen Saturation (Exit) 97 %   Rating of Perceived Exertion (Exercise) 11   Perceived Dyspnea (Exercise) 1   Duration Progress to 45 minutes of aerobic exercise without signs/symptoms of physical distress   Intensity THRR unchanged     Progression   Progression Continue to progress workloads to maintain intensity without signs/symptoms of physical distress.     Resistance Training   Training Prescription Yes   Weight blue bands   Reps 10-15   Time 10 Minutes     Interval Training   Interval Training No     NuStep   Level 6   Minutes 17   METs 3.7     Rower   Level 6    Minutes 17      History  Smoking Status  . Former Smoker  . Packs/day: 0.00  . Years: 43.00  . Types: Cigarettes  Smokeless Tobacco  . Never Used    Goals Met:  Proper associated with RPD/PD & O2 Sat Independence with exercise equipment Improved SOB with ADL's Achieving weight loss Exercise tolerated well Strength training completed today  Goals Unmet:  Not Applicable  Comments: Service time is from 1030 to 1230    Dr. Rush Farmer is Medical Director for Pulmonary Rehab at [facility].

## 2016-11-21 ENCOUNTER — Encounter (HOSPITAL_COMMUNITY)
Admission: RE | Admit: 2016-11-21 | Discharge: 2016-11-21 | Disposition: A | Discharge status: Home / Self Care | Payer: Medicare Other | Source: Ambulatory Visit | Attending: Pulmonary Disease | Admitting: Pulmonary Disease

## 2016-11-21 DIAGNOSIS — J439 Emphysema, unspecified: Secondary | ICD-10-CM | POA: Diagnosis not present

## 2016-11-21 NOTE — Progress Notes (Signed)
Pulmonary Individual Treatment Plan  Patient Details  Name: [name] MRN: [id number] Date of Birth: Nov 12, 1940 Referring Provider:   April Manson Pulmonary Rehab Walk Test from 08/10/2016 in [facility]  Referring Provider  Dr. Nelda Marseille      Initial Encounter Date:  Flowsheet Row Pulmonary Rehab Walk Test from 08/10/2016 in [place]  Date  08/11/16  Referring Provider  Dr. Nelda Marseille      Visit Diagnosis: Pulmonary emphysema, unspecified emphysema type ([place])  Patient's Home Medications on Admission:   Current Outpatient Prescriptions:  .  acetaminophen (TYLENOL) 325 MG tablet, Take by mouth., Disp: , Rfl:  .  ADVAIR DISKUS 250-50 MCG/DOSE AEPB, USE 1 INHALATION TWICE A DAY, Disp: 180 each, Rfl: 3 .  albuterol (PROVENTIL) (2.5 MG/3ML) 0.083% nebulizer solution, Take 3 mLs (2.5 mg total) by nebulization every 4 (four) hours as needed for wheezing or shortness of breath., Disp: 450 mL, Rfl: 3 .  ALPRAZolam (XANAX) 0.5 MG tablet, TAKE 1 TABLET BY MOUTH EVERY DAY AT BEDTIME AS NEEDED ANXIETY OR INSOMNIA, Disp: 90 tablet, Rfl: 0 .  amLODipine (NORVASC) 10 MG tablet, Take 1 tablet (10 mg total) by mouth daily., Disp: 90 tablet, Rfl: 3 .  aspirin 81 MG tablet, Take 81 mg by mouth every other day. , Disp: , Rfl:  .  b complex-C-folic acid 1 MG capsule, Take 1 capsule by mouth daily.  , Disp: , Rfl:  .  buPROPion (WELLBUTRIN XL) 150 MG 24 hr tablet, TAKE 1 TABLET DAILY, Disp: 90 tablet, Rfl: 3 .  Calcium Carbonate-Vit D-Min (CALCIUM 1200 PO), Take by mouth., Disp: , Rfl:  .  celecoxib (CELEBREX) 200 MG capsule, TAKE 1 CAPSULE DAILY, Disp: 90 capsule, Rfl: 2 .  Cyanocobalamin 1000 MCG CAPS, Take by mouth., Disp: , Rfl:  .  diphenhydrAMINE (BENADRYL) 50 MG capsule, Take 50 mg by mouth at bedtime as needed for sleep., Disp: , Rfl:  .  DULoxetine (CYMBALTA) 60 MG capsule, TAKE 1 CAPSULE DAILY, Disp: 90 capsule, Rfl: 3 .   fluticasone (FLONASE) 50 MCG/ACT nasal spray, Place 2 sprays into both nostrils daily., Disp: 48 g, Rfl: 3 .  Fluticasone-Salmeterol (ADVAIR DISKUS) 250-50 MCG/DOSE AEPB, Inhale into the lungs., Disp: , Rfl:  .  folic acid (FOLVITE) 1 MG tablet, Take by mouth., Disp: , Rfl:  .  levothyroxine (SYNTHROID, LEVOTHROID) 100 MCG tablet, TAKE 1 TABLET EVERY MORNING, Disp: 90 tablet, Rfl: 2 .  loratadine (CLARITIN) 10 MG tablet, TAKE 1 TABLET DAILY, Disp: 90 tablet, Rfl: 3 .  losartan-hydrochlorothiazide (HYZAAR) 100-25 MG tablet, Take 1 tablet by mouth daily., Disp: 90 tablet, Rfl: 3 .  omeprazole (PRILOSEC) 20 MG capsule, TAKE 1 CAPSULE DAILY, Disp: 90 capsule, Rfl: 1 .  pravastatin (PRAVACHOL) 40 MG tablet, Take 1 tablet (40 mg total) by mouth daily. Stop simvastatin, Disp: 90 tablet, Rfl: 3 .  traZODone (DESYREL) 50 MG tablet, Take 50 mg by mouth at bedtime as needed for sleep. , Disp: , Rfl:  .  VITAMIN D, CHOLECALCIFEROL, PO, Take 1 tablet by mouth daily., Disp: , Rfl:   Past Medical History: Past Medical History:  Diagnosis Date  . Allergic rhinitis   . Anemia   . Anxiety   . Arrhythmia   . Arthritis    "hands, neck, right shoulder" (07/28/2015)  . Aseptic necrosis ([place])   . Asthma dxc'd 05/2015  . Basal cell cancer   . Bleeding stomach ulcer ~ 06/2005  .  COPD (chronic obstructive pulmonary disease) ([place])   . Depression   . Diverticula of colon   . Esophagitis   . Gastric ulcer with hemorrhage   . GERD (gastroesophageal reflux disease)   . Heart murmur   . History of blood transfusion 06/2005   "related to pneumonia"  . Hyperlipidemia   . Hypertension   . Hypothyroidism   . Kidney stones   . Lung cancer ([place])   . Mitral valve prolapse    "no ORs" (07/28/2015)  . Osteopenia   . Pneumonia 1970's; 06/2005  . Sleep apnea dx'd 06/2015   "haven't had followup appt yet" 07/28/2015)  . Squamous cell skin cancer     Tobacco Use: History  Smoking Status  . Former Smoker  .  Packs/day: 0.00  . Years: 43.00  . Types: Cigarettes  Smokeless Tobacco  . Never Used    Labs: Recent Review Flowsheet Data    Labs for ITP Cardiac and Pulmonary Rehab Latest Ref Rng & Units 12/27/2009 12/29/2009 01/16/2014 04/06/2016 11/03/2016   Cholestrol <200 mg/dL - - 184 194 220(H)   LDLCALC <100 mg/dL - - 97 86 115(H)   HDL >50 mg/dL - - 58 62 64   Trlycerides <150 mg/dL - - 143 232(H) 205(H)   PHART 7.350 - 7.400 7.438(H) 7.390 - - -   PCO2ART 35.0 - 45.0 mmHg 37.1 40.9 - - -   HCO3 20.0 - 24.0 mEq/L 24.7(H) 24.7(H) - - -   TCO2 0 - 100 mmol/L 25.8 26 - - -   ACIDBASEDEF 0.0 - 2.0 mmol/L - - - - -   O2SAT % 82.2 89.0 - - -      Capillary Blood Glucose: Lab Results  Component Value Date   GLUCAP 106 (H) 01/02/2010   GLUCAP 124 (H) 01/01/2010   GLUCAP 132 (H) 01/01/2010   GLUCAP 126 (H) 01/01/2010   GLUCAP 114 (H) 01/01/2010     ADL UCSD:   Pulmonary Function Assessment:     Pulmonary Function Assessment - 07/28/16 1423      Breath   Bilateral Breath Sounds Clear   Shortness of Breath Yes;Limiting activity      Exercise Target Goals:    Exercise Program Goal: Individual exercise prescription set with THRR, safety & activity barriers. Participant demonstrates ability to understand and report RPE using BORG scale, to self-measure pulse accurately, and to acknowledge the importance of the exercise prescription.  Exercise Prescription Goal: Starting with aerobic activity 30 plus minutes a day, 3 days per week for initial exercise prescription. Provide home exercise prescription and guidelines that participant acknowledges understanding prior to discharge.  Activity Barriers & Risk Stratification:     Activity Barriers & Cardiac Risk Stratification - 07/28/16 1421      Activity Barriers & Cardiac Risk Stratification   Activity Barriers Deconditioning;Shortness of Breath      6 Minute Walk:     6 Minute Walk    Row Name 08/11/16 0838         6  Minute Walk   Phase Initial     Distance 1563 feet     Walk Time 6 minutes     # of Rest Breaks 0     MPH 2.96     METS 2.2     RPE 14     Perceived Dyspnea  3     Symptoms No     Resting HR 87 bpm     Resting BP 150/81  Max Ex. HR 108 bpm     Max Ex. BP 198/80  2 days into new BP medicine     2 Minute Post BP 150/74       Interval HR   Baseline HR 87     1 Minute HR 91     2 Minute HR 108     3 Minute HR 108     4 Minute HR 95     5 Minute HR 107     6 Minute HR 104     2 Minute Post HR 93     Interval Heart Rate? Yes       Interval Oxygen   Interval Oxygen? Yes     Baseline Oxygen Saturation % 96 %     Baseline Liters of Oxygen 0 L     1 Minute Oxygen Saturation % 93 %     1 Minute Liters of Oxygen 0 L     2 Minute Oxygen Saturation % 96 %     2 Minute Liters of Oxygen 0 L     3 Minute Oxygen Saturation % 96 %     3 Minute Liters of Oxygen 0 L     4 Minute Oxygen Saturation % 93 %     4 Minute Liters of Oxygen 0 L     5 Minute Oxygen Saturation % 92 %     5 Minute Liters of Oxygen 0 L     6 Minute Oxygen Saturation % 90 %     6 Minute Liters of Oxygen 0 L     2 Minute Post Oxygen Saturation % 93 %     2 Minute Post Liters of Oxygen 0 L        Oxygen Initial Assessment:     Oxygen Initial Assessment - 11/20/16 1037      Home Oxygen   Home Oxygen Device None     Intervention   Short Term Goals To learn and exhibit compliance with exercise, home and travel O2 prescription   Long  Term Goals Exhibits compliance with exercise, home and travel O2 prescription      Oxygen Re-Evaluation:   Oxygen Discharge (Final Oxygen Re-Evaluation):   Initial Exercise Prescription:     Initial Exercise Prescription - 08/11/16 0800      Date of Initial Exercise RX and Referring Provider   Date 08/11/16   Referring Provider Dr. Nelda Marseille     NuStep   Level 2   Minutes 17   METs 1.7     Rower   Level 2   Minutes 17     Track   Laps 10   Minutes 17      Prescription Details   Frequency (times per week) 2   Duration Progress to 45 minutes of aerobic exercise without signs/symptoms of physical distress     Intensity   THRR 40-80% of Max Heartrate 58-117   Ratings of Perceived Exertion 11-13   Perceived Dyspnea 0-4     Progression   Progression Continue progressive overload as per policy without signs/symptoms or physical distress.     Resistance Training   Training Prescription Yes   Weight blue bands   Reps 10-12      Perform Capillary Blood Glucose checks as needed.  Exercise Prescription Changes:     Exercise Prescription Changes    Row Name 08/15/16 1200 08/17/16 1200 08/22/16 1200 08/29/16 1200 09/05/16 1232     Response to Exercise  Blood Pressure (Admit) 112/60 114/64 98/60 114/62 100/60   Blood Pressure (Exercise) 126/70 140/82 120/72 116/70 144/88   Blood Pressure (Exit) 102/50 116/60 108/60 118/60 104/60   Heart Rate (Admit) 79 bpm 81 bpm 78 bpm 80 bpm 81 bpm   Heart Rate (Exercise) 94 bpm 92 bpm 91 bpm 93 bpm 96 bpm   Heart Rate (Exit) 84 bpm 81 bpm 73 bpm 88 bpm 83 bpm   Oxygen Saturation (Admit) 94 % 97 % 95 % 96 % 97 %   Oxygen Saturation (Exercise) 95 % 95 % 94 % 93 % 92 %   Oxygen Saturation (Exit) 95 % 97 % 97 % 99 % 98 %   Rating of Perceived Exertion (Exercise) _0 Perceived Dyspnea (Exercise) _1 Duration Progress to 45 minutes of aerobic exercise without signs/symptoms of physical distress Progress to 45 minutes of aerobic exercise without signs/symptoms of physical distress Progress to 45 minutes of aerobic exercise without signs/symptoms of physical distress Progress to 45 minutes of aerobic exercise without signs/symptoms of physical distress Progress to 45 minutes of aerobic exercise without signs/symptoms of physical distress   Intensity -  40-80% HRR THRR unchanged THRR unchanged THRR unchanged THRR unchanged     Progression   Progression Continue to progress workloads to  maintain intensity without signs/symptoms of physical distress. Continue to progress workloads to maintain intensity without signs/symptoms of physical distress. Continue to progress workloads to maintain intensity without signs/symptoms of physical distress. Continue to progress workloads to maintain intensity without signs/symptoms of physical distress. Continue to progress workloads to maintain intensity without signs/symptoms of physical distress.     Resistance Training   Training Prescription _2    Weight _3    Reps 10-12  10 minutes of strength training 10-12  10 mi utes of strength training 10-12  10 minutes of strength training 10-12  10 minutes of strength training 10-12  10 minutes of strength training     Interval Training   Interval Training _4      NuStep   Level 3  - _5 Minutes 17  - _6 METs 2.4  - 2.6 2.9 3.1     Rower   Level 1 2 3._7 Minutes _8 Track   Laps _9 Minutes _10 Exercise Review   Progression  -  - Yes Yes Yes   Row Name 09/07/16 1200 09/14/16 1200 09/19/16 1200 09/21/16 1200 10/03/16 1200     Response to Exercise   Blood Pressure (Admit) 110/64 110/64 140/70 122/70 118/50   Blood Pressure (Exercise) 122/60 124/76 100/60 120/70 104/64   Blood Pressure (Exit) 114/60 110/60 100/56 108/58 110/62   Heart Rate (Admit) 77 bpm 74 bpm 78 bpm 75 bpm 73 bpm   Heart Rate (Exercise) 90 bpm 83 bpm 89 bpm 91 bpm 87 bpm   Heart Rate (Exit) 86 bpm 77 bpm 80 bpm 75 bpm  -   Oxygen Saturation (Admit) 97 % 97 % 99 % 98 % 97 %   Oxygen Saturation (Exercise) 94 % 97 % 96 % 95 % 95 %   Oxygen Saturation (Exit) 95 % 97 % 97 % 95 %  -  Rating of Perceived Exertion (Exercise) _0 Perceived Dyspnea (Exercise) 3 1 2._1 Duration Progress to 45 minutes of aerobic exercise without signs/symptoms of physical distress  Progress to 45 minutes of aerobic exercise without signs/symptoms of physical distress Progress to 45 minutes of aerobic exercise without signs/symptoms of physical distress Progress to 45 minutes of aerobic exercise without signs/symptoms of physical distress Progress to 45 minutes of aerobic exercise without signs/symptoms of physical distress   Intensity _2      Progression   Progression Continue to progress workloads to maintain intensity without signs/symptoms of physical distress. Continue to progress workloads to maintain intensity without signs/symptoms of physical distress. Continue to progress workloads to maintain intensity without signs/symptoms of physical distress. Continue to progress workloads to maintain intensity without signs/symptoms of physical distress. Continue to progress workloads to maintain intensity without signs/symptoms of physical distress.     Resistance Training   Training Prescription _3    Weight _4    Reps 10-12  10 minutes of strength training 10-12  10 minutes of strength training 10-12  10 minutes of strength training 10-12  10 minutes of strength training 10-12  10 minutes of strength training     Interval Training   Interval Training _5      NuStep   Level _6 Minutes 7  left early 42  left early _7 METs  - 3.3 3.3 3.1  -     Rower   Level  - 5 5  - 6   Minutes  - 17 17  - 17     Track   Laps 15  - _8 Minutes 17  - _9 Home Exercise Plan   Plans to continue exercise at  - Home  -  -  -   Frequency  - Add 3 additional days to program exercise sessions.  -  -  -     Exercise Review   Progression  - Yes  -  -  -   Row Name 10/05/16 1200 10/10/16 1200 10/12/16 1200 10/17/16 1200 10/19/16 1200     Response to Exercise   Blood Pressure (Admit) 100/56 98/70 104/70  98/54 98/54   Blood Pressure (Exercise) 148/70 122/70 140/80 128/70 132/70   Blood Pressure (Exit) 114/60 102/60 110/70 94/60 118/70   Heart Rate (Admit) 75 bpm 71 bpm 78 bpm 77 bpm 67 bpm   Heart Rate (Exercise) 88 bpm 86 bpm 95 bpm 93 bpm 87 bpm   Heart Rate (Exit) 81 bpm 70 bpm 81 bpm 76 bpm 75 bpm   Oxygen Saturation (Admit) 95 % 97 % 97 % 96 % 98 %   Oxygen Saturation (Exercise) 96 % 94 % 96 % 96 % 97 %   Oxygen Saturation (Exit) 96 % 99 % 98 % 96 % 97 %   Rating of Perceived Exertion (Exercise) _10 Perceived Dyspnea (Exercise) _11 Duration Progress to 45 minutes of aerobic exercise without signs/symptoms of physical distress Progress to 45 minutes of aerobic exercise without signs/symptoms of physical distress Progress to 45 minutes of aerobic exercise without signs/symptoms of physical distress Progress to 45 minutes of aerobic  exercise without signs/symptoms of physical distress Progress to 45 minutes of aerobic exercise without signs/symptoms of physical distress   Intensity _0      Progression   Progression Continue to progress workloads to maintain intensity without signs/symptoms of physical distress. Continue to progress workloads to maintain intensity without signs/symptoms of physical distress. Continue to progress workloads to maintain intensity without signs/symptoms of physical distress. Continue to progress workloads to maintain intensity without signs/symptoms of physical distress. Continue to progress workloads to maintain intensity without signs/symptoms of physical distress.     Resistance Training   Training Prescription _1    Weight _2    Reps 10-12  10 minutes of strength training 10-12  10 minutes of strength training 10-12  10 minutes of strength training 10-12  10 minutes of strength training 10-12  10  minutes of strength training     Interval Training   Interval Training _3      NuStep   Level _4 Minutes _5 METs 3.3 3 2.9 2.9 2.8     Rower   Level 6 6  - 6 6   Minutes 17 17  - 23 17     Track   Laps  - _6 -   Minutes  - _7 -   Row Name 10/24/16 1200 10/26/16 1200 10/31/16 1200 11/02/16 1200 11/07/16 1200     Response to Exercise   Blood Pressure (Admit) 96/66 110/60 112/66 108/60 100/64   Blood Pressure (Exercise) 128/60 142/60 130/64 140/76 146/80   Blood Pressure (Exit) 98/56 112/70 100/54 98/64 100/64   Heart Rate (Admit) 78 bpm 72 bpm 82 bpm 75 bpm 77 bpm   Heart Rate (Exercise) 100 bpm 95 bpm 105 bpm 93 bpm 98 bpm   Heart Rate (Exit) 92 bpm 95 bpm 95 bpm 85 bpm 84 bpm   Oxygen Saturation (Admit) 97 % 98 % 98 % 97 % 98 %   Oxygen Saturation (Exercise) 95 % 94 % 94 % 95 % 94 %   Oxygen Saturation (Exit) 98 % 99 % 99 % 99 % 100 %   Rating of Perceived Exertion (Exercise) _8 Perceived Dyspnea (Exercise) _9 Duration Progress to 45 minutes of aerobic exercise without signs/symptoms of physical distress Progress to 45 minutes of aerobic exercise without signs/symptoms of physical distress Progress to 45 minutes of aerobic exercise without signs/symptoms of physical distress Progress to 45 minutes of aerobic exercise without signs/symptoms of physical distress Progress to 45 minutes of aerobic exercise without signs/symptoms of physical distress   Intensity _10      Progression   Progression Continue to progress workloads to maintain intensity without signs/symptoms of physical distress. Continue to progress workloads to maintain intensity without signs/symptoms of physical distress. Continue to progress workloads to maintain intensity without signs/symptoms of physical distress. Continue to progress workloads to maintain intensity without  signs/symptoms of physical distress. Continue to progress workloads to maintain intensity without signs/symptoms of physical distress.     Resistance Training   Training Prescription _11    Weight _12    Reps 10-12  10  minutes of strength training 10-12  10 minutes of strength training 10-12  10 minutes of strength training 10-12  10 minutes of strength training 10-15   Time  -  -  -  - 10 Minutes     Interval Training   Interval Training _0      NuStep   Level 6 - _1 Minutes 17 - _2 METs 3.1 - 3.2 3.1 3.8     Rower   Level _3 Minutes _4 Track   Laps _5 - 14   Minutes _6 - 17   Row Name 11/09/16 1226 11/14/16 1200 11/16/16 1300         Response to Exercise   Blood Pressure (Admit) 136/84 128/70 124/70     Blood Pressure (Exercise) 148/76 140/70 128/73     Blood Pressure (Exit) 120/60 120/64 120/68     Heart Rate (Admit) 73 bpm 78 bpm 74 bpm     Heart Rate (Exercise) 91 bpm 106 bpm 95 bpm     Heart Rate (Exit) 77 bpm 82 bpm 78 bpm     Oxygen Saturation (Admit) 98 % 97 % 97 %     Oxygen Saturation (Exercise) 95 % 97 % 95 %     Oxygen Saturation (Exit) 97 % 98 % 97 %     Rating of Perceived Exertion (Exercise) _7 Perceived Dyspnea (Exercise) _8 Duration Progress to 45 minutes of aerobic exercise without signs/symptoms of physical distress Progress to 45 minutes of aerobic exercise without signs/symptoms of physical distress Progress to 45 minutes of aerobic exercise without signs/symptoms of physical distress     Intensity THRR unchanged THRR unchanged THRR unchanged       Progression   Progression Continue to progress workloads to maintain intensity without signs/symptoms of physical distress. Continue to progress workloads to maintain intensity without signs/symptoms of physical distress. Continue to progress workloads to maintain  intensity without signs/symptoms of physical distress.       Resistance Training   Training Prescription Yes Yes Yes     Weight blue bands blue bands blue bands     Reps 10-15 10-15 10-15     Time 10 Minutes 10 Minutes 10 Minutes       Interval Training   Interval Training No No No       NuStep   Level _9 Minutes _10 METs 3.4 3.9 3.7       Rower   Level - 6 6     Minutes - 17 17       Track   Laps 15 14  -     Minutes 17 17  -        Exercise Comments:     Exercise Comments    Row Name 08/29/16 0815 09/14/16 1349 10/02/16 1632 10/23/16 1512     Exercise Comments Patient has only attended three exercise sessions. Will cont. to monitor and progress. Home exercise completed Patient is progressing well in her exercise. She is always looking to progress her workloads. MET level places her at a moderate workload. Patient is progressing well in her exercise. Her MET level places her in the moderate category. She and her husband are  exercising at the [facility] on her off days. Will cont. to monitor and progress.       Exercise Goals and Review:   Exercise Goals Re-Evaluation :     Exercise Goals Re-Evaluation    Row Name 11/20/16 1613             Exercise Goal Re-Evaluation   Exercise Goals Review Increase Strenth and Stamina;Increase Physical Activity       Comments Patient is nearing graduation from Pulmonary Rehab. Patient has done well in program and is progressing well. Patient is exercising at the [facility] on her off days from rehab.        Expected Outcomes Through the exercise here at rehab the patient with increase physical capacity, strength, and stamina.          Discharge Exercise Prescription (Final Exercise Prescription Changes):     Exercise Prescription Changes - 11/16/16 1300      Response to Exercise   Blood Pressure (Admit) 124/70   Blood Pressure (Exercise) 128/73   Blood Pressure (Exit) 120/68   Heart Rate (Admit) 74 bpm   Heart  Rate (Exercise) 95 bpm   Heart Rate (Exit) 78 bpm   Oxygen Saturation (Admit) 97 %   Oxygen Saturation (Exercise) 95 %   Oxygen Saturation (Exit) 97 %   Rating of Perceived Exertion (Exercise) 11   Perceived Dyspnea (Exercise) 1   Duration Progress to 45 minutes of aerobic exercise without signs/symptoms of physical distress   Intensity THRR unchanged     Progression   Progression Continue to progress workloads to maintain intensity without signs/symptoms of physical distress.     Resistance Training   Training Prescription Yes   Weight blue bands   Reps 10-15   Time 10 Minutes     Interval Training   Interval Training No     NuStep   Level 6   Minutes 17   METs 3.7     Rower   Level 6   Minutes 17      Nutrition:  Target Goals: Understanding of nutrition guidelines, daily intake of sodium <1553m, cholesterol <2081m calories 30% from fat and 7% or less from saturated fats, daily to have 5 or more servings of fruits and vegetables.  Biometrics:    Nutrition Therapy Plan and Nutrition Goals:     Nutrition Therapy & Goals - 10/03/16 1216      Nutrition Therapy   Diet General, Healthful     Personal Nutrition Goals   Nutrition Goal 1-2 lb wt loss/week to a wt loss goal of 6-9 lb at graduation from Pulmonary ReBreaeducate and counsel regarding individualized specific dietary modifications aiming towards targeted core components such as weight, hypertension, lipid management, diabetes, heart failure and other comorbidities.   Expected Outcomes Short Term Goal: Understand basic principles of dietary content, such as calories, fat, sodium, cholesterol and nutrients.;Long Term Goal: Adherence to prescribed nutrition plan.      Nutrition Discharge: Rate Your Plate Scores:   Nutrition Goals Re-Evaluation:   Nutrition Goals Discharge (Final Nutrition Goals Re-Evaluation):   Psychosocial: Target Goals: Acknowledge  presence or absence of significant depression and/or stress, maximize coping skills, provide positive support system. Participant is able to verbalize types and ability to use techniques and skills needed for reducing stress and depression.  Initial Review & Psychosocial Screening:     Initial Psych Review & Screening - 07/28/16 1425  Initial Review   Current issues with History of Depression;Current Stress Concerns   Comments chronic illness of [place]     Family Dynamics   Good Support System? Yes     Barriers   Psychosocial barriers to participate in program Psychosocial barriers identified (see note)     Screening Interventions   Interventions Encouraged to exercise      Quality of Life Scores:   PHQ-9: Recent Review Flowsheet Data    Depression screen [facility] 2/9 11/03/2016 07/28/2016 07/26/2015 12/01/2013   Decreased Interest 0 0 0 0   Down, Depressed, Hopeless 0 0 0 0   PHQ - 2 Score 0 0 0 0   Altered sleeping - - 0 -   Tired, decreased energy - - 0 -   Change in appetite - - 0 -   Feeling bad or failure about yourself  - - 0 -   Trouble concentrating - - 0 -   Moving slowly or fidgety/restless - - 0 -   Suicidal thoughts - - 0 -   PHQ-9 Score - - 0 -   Difficult doing work/chores - - Not difficult at all -     Interpretation of Total Score  Total Score Depression Severity:  1-4 = Minimal depression, 5-9 = Mild depression, 10-14 = Moderate depression, 15-19 = Moderately severe depression, 20-27 = Severe depression   Psychosocial Evaluation and Intervention:     Psychosocial Evaluation - 11/20/16 1038      Psychosocial Evaluation & Interventions   Interventions Encouraged to exercise with the program and follow exercise prescription   Continue Psychosocial Services  No Follow up required      Psychosocial Re-Evaluation:     Psychosocial Re-Evaluation    Row Name 09/05/16 0827 10/03/16 0831 10/23/16 1555 11/20/16 1038       Psychosocial Re-Evaluation    Comments no psychosocial barriers identified in the past 30 days see ITP for psychosocial barrier no psychosocial barriers identified since last assessment no psychosocial barriers identified since last assessment    Interventions Encouraged to attend Pulmonary Rehabilitation for the exercise Encouraged to attend Pulmonary Rehabilitation for the exercise Encouraged to attend Pulmonary Rehabilitation for the exercise Encouraged to attend Pulmonary Rehabilitation for the exercise       Psychosocial Discharge (Final Psychosocial Re-Evaluation):     Psychosocial Re-Evaluation - 11/20/16 1038      Psychosocial Re-Evaluation   Comments no psychosocial barriers identified since last assessment   Interventions Encouraged to attend Pulmonary Rehabilitation for the exercise      Education: Education Goals: Education classes will be provided on a weekly basis, covering required topics. Participant will state understanding/return demonstration of topics presented.  Learning Barriers/Preferences:     Learning Barriers/Preferences - 07/28/16 1422      Learning Barriers/Preferences   Learning Preferences Group Instruction;Written Material;Individual Instruction      Education Topics: Risk Factor Reduction:  -Group instruction that is supported by a PowerPoint presentation. Instructor discusses the definition of a risk factor, different risk factors for pulmonary disease, and how the heart and lungs work together.     Nutrition for Pulmonary Patient:  -Group instruction provided by PowerPoint slides, verbal discussion, and written materials to support subject matter. The instructor gives an explanation and review of healthy diet recommendations, which includes a discussion on weight management, recommendations for fruit and vegetable consumption, as well as protein, fluid, caffeine, fiber, sodium, sugar, and alcohol. Tips for eating when patients are short of breath are discussed. Flowsheet  Row PULMONARY REHAB OTHER RESPIRATORY from 11/16/2016 in [place]  Date  09/21/16  Educator  edna  Instruction Review Code  2- meets goals/outcomes      Pursed Lip Breathing:  -Group instruction that is supported by demonstration and informational handouts. Instructor discusses the benefits of pursed lip and diaphragmatic breathing and detailed demonstration on how to preform both.     Oxygen Safety:  -Group instruction provided by PowerPoint, verbal discussion, and written material to support subject matter. There is an overview of "What is Oxygen" and "Why do we need it".  Instructor also reviews how to create a safe environment for oxygen use, the importance of using oxygen as prescribed, and the risks of noncompliance. There is a brief discussion on traveling with oxygen and resources the patient may utilize.   Oxygen Equipment:  -Group instruction provided by [facility] Staff utilizing handouts, written materials, and equipment demonstrations. Flowsheet Row PULMONARY REHAB OTHER RESPIRATORY from 11/16/2016 in [place]  Date  09/14/16  Educator  [employer]  Instruction Review Code  2- meets goals/outcomes      Signs and Symptoms:  -Group instruction provided by written material and verbal discussion to support subject matter. Warning signs and symptoms of infection, stroke, and heart attack are reviewed and when to call the physician/911 reinforced. Tips for preventing the spread of infection discussed. Flowsheet Row PULMONARY REHAB OTHER RESPIRATORY from 11/16/2016 in [place]  Date  10/12/16  Educator  RN  Instruction Review Code  2- meets goals/outcomes      Advanced Directives:  -Group instruction provided by verbal instruction and written material to support subject matter. Instructor reviews Advanced Directive laws and proper instruction for filling out document.   Pulmonary  Video:  -Group video education that reviews the importance of medication and oxygen compliance, exercise, good nutrition, pulmonary hygiene, and pursed lip and diaphragmatic breathing for the pulmonary patient. Flowsheet Row PULMONARY REHAB OTHER RESPIRATORY from 11/16/2016 in [place]  Date  11/02/16  Educator  STAFF  Instruction Review Code  2- meets goals/outcomes      Exercise for the Pulmonary Patient:  -Group instruction that is supported by a PowerPoint presentation. Instructor discusses benefits of exercise, core components of exercise, frequency, duration, and intensity of an exercise routine, importance of utilizing pulse oximetry during exercise, safety while exercising, and options of places to exercise outside of rehab.   Flowsheet Row PULMONARY REHAB OTHER RESPIRATORY from 11/16/2016 in [place]  Date  10/26/16  Educator  EP  Instruction Review Code  2- meets goals/outcomes      Pulmonary Medications:  -Verbally interactive group education provided by instructor with focus on inhaled medications and proper administration. Flowsheet Row PULMONARY REHAB OTHER RESPIRATORY from 11/16/2016 in [place]  Date  10/05/16  Educator  Pharm D  Instruction Review Code  2- meets goals/outcomes      Anatomy and Physiology of the Respiratory System and Intimacy:  -Group instruction provided by PowerPoint, verbal discussion, and written material to support subject matter. Instructor reviews respiratory cycle and anatomical components of the respiratory system and their functions. Instructor also reviews differences in obstructive and restrictive respiratory diseases with examples of each. Intimacy, Sex, and Sexuality differences are reviewed with a discussion on how relationships can change when diagnosed with pulmonary disease. Common sexual concerns are reviewed. [place]  OTHER RESPIRATORY from 11/16/2016 in [place]  Date  11/16/16  Educator  RN  Instruction Review Code  2- meets goals/outcomes      Knowledge Questionnaire Score:   Core Components/Risk Factors/Patient Goals at Admission:     Personal Goals and Risk Factors at Admission - 07/28/16 1424      Core Components/Risk Factors/Patient Goals on Admission   Improve shortness of breath with ADL's Yes   Intervention Provide education, individualized exercise plan and daily activity instruction to help decrease symptoms of SOB with activities of daily living.   Expected Outcomes Short Term: Achieves a reduction of symptoms when performing activities of daily living.   Hypertension Yes   Intervention Provide education on lifestyle modifcations including regular physical activity/exercise, weight management, moderate sodium restriction and increased consumption of fresh fruit, vegetables, and low fat dairy, alcohol moderation, and smoking cessation.;Monitor prescription use compliance.   Expected Outcomes Short Term: Continued assessment and intervention until BP is < 140/47m HG in hypertensive participants. < 130/82mHG in hypertensive participants with diabetes, heart failure or chronic kidney disease.;Long Term: Maintenance of blood pressure at goal levels.      Core Components/Risk Factors/Patient Goals Review:      Goals and Risk Factor Review    Row Name 09/05/16 0825 10/03/16 0831 10/23/16 1549 10/23/16 1554 11/20/16 1038     Core Components/Risk Factors/Patient Goals Review   Personal Goals Review Improve shortness of breath with ADL's Improve shortness of breath with ADL's;Hypertension Improve shortness of breath with ADL's;Hypertension Increase Strength and Stamina;Improve shortness of breath with ADL's;Hypertension  -   Review see comments section on ITP see comments section on ITP see comments section on ITP  - see comments section on ITP   Expected  Outcomes see Admission outcomes see Admission outcomes see Admission outcomes  - see Admission outcomes      Core Components/Risk Factors/Patient Goals at Discharge (Final Review):      Goals and Risk Factor Review - 11/20/16 1038      Core Components/Risk Factors/Patient Goals Review   Review see comments section on ITP   Expected Outcomes see Admission outcomes      ITP Comments:   Comments: ITP REVIEW Pt is making expected progress toward pulmonary rehab goals after completing 23 sessions. She is on target to graduate Thursday. Since admission, he blood pressure has been well controlled. She reached a point of frequent episodes of hypotension and has sense been weened from some of her antihypertensive medications. She has perfected her pursed lip breathing technique and does not have to be reminded to use it. She has tolerated increases in her workloads which has increased her stamina and strength. Recommend continued exercise, life style modification, education, and utilization of breathing techniques to increase stamina and strength and decrease shortness of breath with exertion.

## 2016-11-23 ENCOUNTER — Encounter (HOSPITAL_COMMUNITY): Admission: RE | Admit: 2016-11-23 | Payer: Medicare Other | Source: Ambulatory Visit

## 2016-11-28 ENCOUNTER — Encounter (HOSPITAL_COMMUNITY): Payer: Self-pay | Admitting: *Deleted

## 2016-11-28 ENCOUNTER — Encounter (HOSPITAL_COMMUNITY): Payer: Medicare Other

## 2016-11-29 ENCOUNTER — Encounter (HOSPITAL_COMMUNITY): Payer: Self-pay | Admitting: *Deleted

## 2016-11-29 ENCOUNTER — Other Ambulatory Visit: Payer: Self-pay | Admitting: Family Medicine

## 2016-11-29 MED ORDER — FLUTICASONE PROPIONATE 50 MCG/ACT NA SUSP: 2.0000 | Freq: Every day | NASAL | 3 refills | Status: DC

## 2016-11-29 NOTE — Telephone Encounter (Signed)
[facility] requesting refill on Trazodone '50mg'$  1 po qhs - Ok to refill for 90 day supply??    (we have never rx'd this medication)

## 2016-11-30 ENCOUNTER — Encounter (HOSPITAL_COMMUNITY): Payer: Medicare Other

## 2016-11-30 NOTE — Telephone Encounter (Signed)
Denied if we are not prescribing.  Who is she getting it from?  I worry about polypharmacy and confusion in this case.

## 2016-12-11 ENCOUNTER — Other Ambulatory Visit: Payer: Self-pay | Admitting: Family Medicine

## 2016-12-11 NOTE — Telephone Encounter (Signed)
Patient is calling to see if she can get refills on her trazadone and muscle relaxer if possible [phone] [facility] [address]

## 2016-12-12 ENCOUNTER — Encounter (HOSPITAL_COMMUNITY): Payer: Self-pay

## 2016-12-12 DIAGNOSIS — J439 Emphysema, unspecified: Secondary | ICD-10-CM

## 2016-12-12 NOTE — Progress Notes (Signed)
Discharge Summary  Patient Details  Name: [name] MRN: [id number] Date of Birth: [birth year] Referring Provider:     Pulmonary Rehab Walk Test from 08/10/2016 in [place]  Referring Provider  [name]       Number of Visits: 24  Reason for Discharge:  Patient reached a stable level of exercise. Patient independent in their exercise.  Smoking History:  History  Smoking Status  . Former Smoker  . Packs/day: 0.00  . Years: 43.00  . Types: Cigarettes  Smokeless Tobacco  . Never Used    Diagnosis:  Pulmonary emphysema, unspecified emphysema type ([place])  ADL UCSD:     Pulmonary Assessment Scores    Row Name 11/28/16 1600 11/29/16 0916       ADL UCSD   ADL Phase Exit Entry    SOB Score total 39 82       Initial Exercise Prescription:   Discharge Exercise Prescription (Final Exercise Prescription Changes):     Exercise Prescription Changes - 11/16/16 1300      Response to Exercise   Blood Pressure (Admit) 124/70   Blood Pressure (Exercise) 128/73   Blood Pressure (Exit) 120/68   Heart Rate (Admit) 74 bpm   Heart Rate (Exercise) 95 bpm   Heart Rate (Exit) 78 bpm   Oxygen Saturation (Admit) 97 %   Oxygen Saturation (Exercise) 95 %   Oxygen Saturation (Exit) 97 %   Rating of Perceived Exertion (Exercise) 11   Perceived Dyspnea (Exercise) 1   Duration Progress to 45 minutes of aerobic exercise without signs/symptoms of physical distress   Intensity THRR unchanged     Progression   Progression Continue to progress workloads to maintain intensity without signs/symptoms of physical distress.     Resistance Training   Training Prescription Yes   Weight blue bands   Reps 10-15   Time 10 Minutes     Interval Training   Interval Training No     NuStep   Level 6   Minutes 17   METs 3.7     Rower   Level 6   Minutes 17      Functional Capacity:     6 Minute Walk    Row Name 11/21/16 1625         6  Minute Walk   Phase Discharge     Distance 1900 feet     Walk Time 6 minutes     # of Rest Breaks 0     MPH 3.59     METS 3.76     RPE 13     Perceived Dyspnea  1     Symptoms No     Resting HR 76 bpm     Resting BP 98/70     Max Ex. HR 107 bpm     Max Ex. BP 180/80     2 Minute Post BP 158/78       Interval HR   Baseline HR 76     1 Minute HR 74     2 Minute HR 99     3 Minute HR 97     4 Minute HR 107     5 Minute HR 102     6 Minute HR 103     2 Minute Post HR 91     Interval Heart Rate? Yes       Interval Oxygen   Interval Oxygen? Yes     Baseline Oxygen Saturation %  99 %     Baseline Liters of Oxygen 0 L     1 Minute Oxygen Saturation % 94 %     1 Minute Liters of Oxygen 0 L     2 Minute Oxygen Saturation % 95 %     2 Minute Liters of Oxygen 0 L     3 Minute Oxygen Saturation % 97 %     3 Minute Liters of Oxygen 0 L     4 Minute Oxygen Saturation % 97 %     4 Minute Liters of Oxygen 0 L     5 Minute Oxygen Saturation % 97 %     5 Minute Liters of Oxygen 0 L     6 Minute Oxygen Saturation % 96 %     6 Minute Liters of Oxygen 0 L     2 Minute Post Oxygen Saturation % 99 %     2 Minute Post Liters of Oxygen 0 L        Psychological, QOL, Others - Outcomes: PHQ 2/9: Depression screen [name] 2/9 11/21/2016 11/03/2016 07/28/2016 07/26/2015 12/01/2013  Decreased Interest 0 0 0 0 0  Down, Depressed, Hopeless 0 0 0 0 0  PHQ - 2 Score 0 0 0 0 0  Altered sleeping - - - 0 -  Tired, decreased energy - - - 0 -  Change in appetite - - - 0 -  Feeling bad or failure about yourself  - - - 0 -  Trouble concentrating - - - 0 -  Moving slowly or fidgety/restless - - - 0 -  Suicidal thoughts - - - 0 -  PHQ-9 Score - - - 0 -  Difficult doing work/chores - - - Not difficult at all -    Quality of Life:     Quality of Life - 11/29/16 0917      Quality of Life Scores   Health/Function Pre 13.47 %   Socioeconomic Pre 20.21 %   Psych/Spiritual Pre 24.43 %   Family Pre 16  %   GLOBAL Pre 17.58 %      Personal Goals: Goals established at orientation with interventions provided to work toward goal.    Personal Goals Discharge:     Goals and Risk Factor Review    Row Name 10/23/16 1549 10/23/16 1554 11/20/16 1038         Core Components/Risk Factors/Patient Goals Review   Personal Goals Review Improve shortness of breath with ADL's;Hypertension Increase Strength and Stamina;Improve shortness of breath with ADL's;Hypertension  -     Review see comments section on ITP  - see comments section on ITP     Expected Outcomes see Admission outcomes  - see Admission outcomes        Nutrition & Weight - Outcomes:    Nutrition:   Nutrition Discharge:     Nutrition Assessments - 11/29/16 1129      Rate Your Plate Scores   Pre Score 55   Post Score 62      Education Questionnaire Score:     Knowledge Questionnaire Score - 11/29/16 0916      Knowledge Questionnaire Score   Pre Score 12/13     [name] has sucessfully completed the pulmonary rehab program. She met all of her program goals and plans to continue to exercise post discharge at the [name] with her husband.

## 2016-12-14 ENCOUNTER — Ambulatory Visit (INDEPENDENT_AMBULATORY_CARE_PROVIDER_SITE_OTHER): Payer: Medicare Other | Admitting: Family Medicine

## 2016-12-14 ENCOUNTER — Encounter: Payer: Self-pay | Admitting: Family Medicine

## 2016-12-14 VITALS — BP 144/78 | HR 73 | Temp 97.9°F | Resp 18 | Ht 67.75 in | Wt 162.0 lb

## 2016-12-14 DIAGNOSIS — F5101 Primary insomnia: Secondary | ICD-10-CM

## 2016-12-14 DIAGNOSIS — S161XXA Strain of muscle, fascia and tendon at neck level, initial encounter: Secondary | ICD-10-CM

## 2016-12-14 MED ORDER — DIAZEPAM 10 MG PO TABS: 5.0000 mg | ORAL_TABLET | Freq: Three times a day (TID) | ORAL | 0 refills | Status: DC | PRN

## 2016-12-14 NOTE — Progress Notes (Signed)
Subjective:    Patient ID: [name], female    DOB: 09-21-1940, 76 y.o.   MRN: [id number]  HPI  Patient has been recently had a stroke. She's been staying with him at the hospital. She also strained a muscle in her upper back/neck last week working in her yard. Pain is located in both shoulder blades and radiates up into the trapezius. She also complains of aching pain in stiffness in both shoulders. This was exacerbated by sleeping on the couch at hospital and laying on the couch as of hospital. Due to the fear and anxiety of her husband having a stroke, she is also not sleeping well. The pain in stiffness in her shoulders and in her neck and in her trapezius make this worse. She denies any numbness or tingling in her hands or in her arms. She denies any weakness in grip strength. Spurling's maneuvers negative today. She does have palpable muscle spasms in the thoracic paraspinal muscles adjacent to her scapula. Past Medical History:  Diagnosis Date  . Allergic rhinitis   . Anemia   . Anxiety   . Arrhythmia   . Arthritis    "hands, neck, right shoulder" (07/28/2015)  . Aseptic necrosis ([place])   . Asthma dxc'd 05/2015  . Basal cell cancer   . Bleeding stomach ulcer ~ 06/2005  . COPD (chronic obstructive pulmonary disease) ([place])   . Depression   . Diverticula of colon   . Esophagitis   . Gastric ulcer with hemorrhage   . GERD (gastroesophageal reflux disease)   . Heart murmur   . History of blood transfusion 06/2005   "related to pneumonia"  . Hyperlipidemia   . Hypertension   . Hypothyroidism   . Kidney stones   . Lung cancer ([place])   . Mitral valve prolapse    "no ORs" (07/28/2015)  . Osteopenia   . Pneumonia 1970's; 06/2005  . Sleep apnea dx'd 06/2015   "haven't had followup appt yet" 07/28/2015)  . Squamous cell skin cancer    Past Surgical History:  Procedure Laterality Date  . ABDOMINAL HYSTERECTOMY  1988  . HAMMER TOE SURGERY Right ~ 2010  .  SALPINGOOPHORECTOMY Left 1988  . SKIN CANCER EXCISION     "I've had severl cut off RLE; left foreqrm, nose under nose"  . THORACOTOMY Left 06/2005  . THORACOTOMY/LOBECTOMY  09/2009; 11/2009   left; right   Current Outpatient Prescriptions on File Prior to Visit  Medication Sig Dispense Refill  . acetaminophen (TYLENOL) 325 MG tablet Take by mouth.    . ADVAIR DISKUS 250-50 MCG/DOSE AEPB USE 1 INHALATION TWICE A DAY 180 each 3  . albuterol (PROVENTIL) (2.5 MG/3ML) 0.083% nebulizer solution Take 3 mLs (2.5 mg total) by nebulization every 4 (four) hours as needed for wheezing or shortness of breath. 450 mL 3  . ALPRAZolam (XANAX) 0.5 MG tablet TAKE 1 TABLET BY MOUTH EVERY DAY AT BEDTIME AS NEEDED ANXIETY OR INSOMNIA 90 tablet 0  . amLODipine (NORVASC) 10 MG tablet Take 1 tablet (10 mg total) by mouth daily. 90 tablet 3  . aspirin 81 MG tablet Take 81 mg by mouth every other day.     . b complex-C-folic acid 1 MG capsule Take 1 capsule by mouth daily.      Marland Kitchen buPROPion (WELLBUTRIN XL) 150 MG 24 hr tablet TAKE 1 TABLET DAILY 90 tablet 3  . Calcium Carbonate-Vit D-Min (CALCIUM 1200 PO) Take by mouth.    . celecoxib (CELEBREX) 200  MG capsule TAKE 1 CAPSULE DAILY 90 capsule 2  . Cyanocobalamin 1000 MCG CAPS Take by mouth.    . diphenhydrAMINE (BENADRYL) 50 MG capsule Take 50 mg by mouth at bedtime as needed for sleep.    . DULoxetine (CYMBALTA) 60 MG capsule TAKE 1 CAPSULE DAILY 90 capsule 3  . fluticasone (FLONASE) 50 MCG/ACT nasal spray Place 2 sprays into both nostrils daily. 48 g 3  . Fluticasone-Salmeterol (ADVAIR DISKUS) 250-50 MCG/DOSE AEPB Inhale into the lungs.    . folic acid (FOLVITE) 1 MG tablet Take by mouth.    . levothyroxine (SYNTHROID, LEVOTHROID) 100 MCG tablet TAKE 1 TABLET EVERY MORNING 90 tablet 2  . loratadine (CLARITIN) 10 MG tablet TAKE 1 TABLET DAILY 90 tablet 3  . losartan-hydrochlorothiazide (HYZAAR) 100-25 MG tablet Take 1 tablet by mouth daily. 90 tablet 3  . omeprazole  (PRILOSEC) 20 MG capsule TAKE 1 CAPSULE DAILY 90 capsule 1  . pravastatin (PRAVACHOL) 40 MG tablet Take 1 tablet (40 mg total) by mouth daily. Stop simvastatin 90 tablet 3  . traZODone (DESYREL) 50 MG tablet Take 50 mg by mouth at bedtime as needed for sleep.     Marland Kitchen VITAMIN D, CHOLECALCIFEROL, PO Take 1 tablet by mouth daily.     No current facility-administered medications on file prior to visit.    Allergies  Allergen Reactions  . Doxycycline Nausea Only  . Erythromycin Nausea Only  . Sulfonamide Derivatives Hives   Social History   Social History  . Marital status: Married    Spouse name: N/A  . Number of children: 3  . Years of education: N/A   Occupational History  . retired Marine scientist Retired   Social History Main Topics  . Smoking status: Former Smoker    Packs/day: 0.00    Years: 43.00    Types: Cigarettes  . Smokeless tobacco: Never Used  . Alcohol use 4.2 oz/week    7 Shots of liquor per week  . Drug use: No  . Sexual activity: Yes   Other Topics Concern  . Not on file   Social History Narrative  . No narrative on file     Review of Systems  All other systems reviewed and are negative.      Objective:   Physical Exam  Constitutional: She appears well-developed and well-nourished.  Cardiovascular: Normal rate, regular rhythm and normal heart sounds.   Pulmonary/Chest: Effort normal and breath sounds normal. No respiratory distress. She has no wheezes. She has no rales.  Musculoskeletal:       Cervical back: She exhibits decreased range of motion, tenderness, pain and spasm.       Thoracic back: She exhibits pain and spasm.       Back:  Vitals reviewed.         Assessment & Plan:  Neck muscle strain, initial encounter  Primary insomnia  Begin Valium 5-10 mg every 8 hours as needed for muscle spasms and/or insomnia. After 1 week, hopefully the patient can discontinue Valium and we will replace it with trazodone 50 mg by mouth daily at bedtime for  sleep. She can also use ibuprofen as needed for muscle pain in her neck and in her shoulders, 800 mg every 8 hours.

## 2016-12-18 MED ORDER — TRAZODONE HCL 50 MG PO TABS: 50.0000 mg | ORAL_TABLET | Freq: Every evening | ORAL | 2 refills | Status: DC | PRN

## 2016-12-18 NOTE — Telephone Encounter (Signed)
Medication refilled per protocol. 

## 2017-01-16 ENCOUNTER — Other Ambulatory Visit: Payer: Self-pay | Admitting: Family Medicine

## 2017-01-16 DIAGNOSIS — G4734 Idiopathic sleep related nonobstructive alveolar hypoventilation: Secondary | ICD-10-CM

## 2017-01-27 ENCOUNTER — Other Ambulatory Visit: Payer: Self-pay | Admitting: Family Medicine

## 2017-02-20 ENCOUNTER — Telehealth: Payer: Self-pay | Admitting: Family Medicine

## 2017-02-20 NOTE — Telephone Encounter (Signed)
Pt called [facility] and states that she got back from trip Friday and has been running a fever with ST. Her husband had some Levaquin left over and she started taking that and after 3 hours she felt better.   Per [facility] Antibx should not have made her feel better in 3 hours - does not need antibx unless fever over 101 and/or symptoms persist longer then 7 - 10 days.   Called pt and [facility] to return call.

## 2017-02-21 NOTE — Telephone Encounter (Signed)
Patient states that she is much better today. Does not require any further recommendations.

## 2017-02-21 NOTE — Telephone Encounter (Signed)
Pt is calling again about still being sick. Wants to know what she can do for relief.

## 2017-03-02 ENCOUNTER — Telehealth: Payer: Self-pay | Admitting: Family Medicine

## 2017-03-02 MED ORDER — TRAZODONE HCL 50 MG PO TABS: 50.0000 mg | ORAL_TABLET | Freq: Every evening | ORAL | 1 refills | Status: DC | PRN

## 2017-03-02 NOTE — Telephone Encounter (Signed)
Requesting a refill of Trazodone to be filled at mail order - Ok to refill??

## 2017-03-02 NOTE — Telephone Encounter (Signed)
ok 

## 2017-03-02 NOTE — Telephone Encounter (Signed)
Medication called/sent to requested pharmacy  

## 2017-03-19 ENCOUNTER — Encounter: Payer: Self-pay | Admitting: Family Medicine

## 2017-03-19 ENCOUNTER — Ambulatory Visit (INDEPENDENT_AMBULATORY_CARE_PROVIDER_SITE_OTHER): Payer: Medicare Other | Admitting: Family Medicine

## 2017-03-19 VITALS — BP 150/84 | HR 74 | Temp 97.8°F | Resp 20 | Ht 67.5 in | Wt 159.0 lb

## 2017-03-19 DIAGNOSIS — I10 Essential (primary) hypertension: Secondary | ICD-10-CM | POA: Diagnosis not present

## 2017-03-19 NOTE — Progress Notes (Signed)
Subjective:    Patient ID: [name], female    DOB: July 14, 1941, 76 y.o.   MRN: [id number]  Hypertension     We had to discontinue the patient's amlodipine due to leg swelling. At present she is on Hyzaar 100/25 by mouth daily however her blood pressure has been averaging 326-712 systolic over 45-80 diastolic. She denies any chest pain. She denies any shortness of breath or dyspnea on exertion Immunization History  Administered Date(s) Administered  . Influenza,inj,Quad PF,36+ Mos 05/29/2014, 05/30/2016  . Influenza-Unspecified 06/23/2015  . Pneumococcal Conjugate-13 01/16/2014  . Pneumococcal Polysaccharide-23 07/03/2012  . Td 09/12/1993  . Tdap 12/21/2012  . Zoster 09/27/2007   Past Medical History:  Diagnosis Date  . Allergic rhinitis   . Anemia   . Anxiety   . Arrhythmia   . Arthritis    "hands, neck, right shoulder" (07/28/2015)  . Aseptic necrosis ([place])   . Asthma dxc'd 05/2015  . Basal cell cancer   . Bleeding stomach ulcer ~ 06/2005  . COPD (chronic obstructive pulmonary disease) ([place])   . Depression   . Diverticula of colon   . Esophagitis   . Gastric ulcer with hemorrhage   . GERD (gastroesophageal reflux disease)   . Heart murmur   . History of blood transfusion 06/2005   "related to pneumonia"  . Hyperlipidemia   . Hypertension   . Hypothyroidism   . Kidney stones   . Lung cancer ([place])   . Mitral valve prolapse    "no ORs" (07/28/2015)  . Osteopenia   . Pneumonia 1970's; 06/2005  . Sleep apnea dx'd 06/2015   "haven't had followup appt yet" 07/28/2015)  . Squamous cell skin cancer    Past Surgical History:  Procedure Laterality Date  . ABDOMINAL HYSTERECTOMY  1988  . HAMMER TOE SURGERY Right ~ 2010  . SALPINGOOPHORECTOMY Left 1988  . SKIN CANCER EXCISION     "I've had severl cut off RLE; left foreqrm, nose under nose"  . THORACOTOMY Left 06/2005  . THORACOTOMY/LOBECTOMY  09/2009; 11/2009   left; right   Current Outpatient Prescriptions  on File Prior to Visit  Medication Sig Dispense Refill  . acetaminophen (TYLENOL) 325 MG tablet Take by mouth.    . ADVAIR DISKUS 250-50 MCG/DOSE AEPB USE 1 INHALATION TWICE A DAY 180 each 3  . albuterol (PROVENTIL) (2.5 MG/3ML) 0.083% nebulizer solution USE 1 VIAL (3 ML OR 2.5 MG TOTAL) VIA NEBULIZER EVERY 4 HOURS AS NEEDED FOR WHEEZING OR SHORTNESS OF BREATH 450 mL 3  . ALPRAZolam (XANAX) 0.5 MG tablet TAKE 1 TABLET BY MOUTH EVERY DAY AT BEDTIME AS NEEDED ANXIETY OR INSOMNIA 90 tablet 0  . aspirin 81 MG tablet Take 81 mg by mouth daily.     Marland Kitchen b complex-C-folic acid 1 MG capsule Take 1 capsule by mouth daily.      Marland Kitchen buPROPion (WELLBUTRIN XL) 150 MG 24 hr tablet TAKE 1 TABLET DAILY 90 tablet 3  . Calcium Carbonate-Vit D-Min (CALCIUM 1200 PO) Take by mouth.    . celecoxib (CELEBREX) 200 MG capsule TAKE 1 CAPSULE DAILY 90 capsule 2  . Cyanocobalamin 1000 MCG CAPS Take by mouth.    . DULoxetine (CYMBALTA) 60 MG capsule TAKE 1 CAPSULE DAILY 90 capsule 3  . fluticasone (FLONASE) 50 MCG/ACT nasal spray Place 2 sprays into both nostrils daily. 48 g 3  . Fluticasone-Salmeterol (ADVAIR DISKUS) 250-50 MCG/DOSE AEPB Inhale into the lungs.    . folic acid (FOLVITE) 1 MG tablet  Take by mouth.    . levothyroxine (SYNTHROID, LEVOTHROID) 100 MCG tablet TAKE 1 TABLET EVERY MORNING 90 tablet 2  . losartan-hydrochlorothiazide (HYZAAR) 100-25 MG tablet Take 1 tablet by mouth daily. 90 tablet 3  . omeprazole (PRILOSEC) 20 MG capsule TAKE 1 CAPSULE DAILY 90 capsule 1  . pravastatin (PRAVACHOL) 40 MG tablet Take 1 tablet (40 mg total) by mouth daily. Stop simvastatin 90 tablet 3  . traZODone (DESYREL) 50 MG tablet Take 1 tablet (50 mg total) by mouth at bedtime as needed for sleep. 60 tablet 1  . VITAMIN D, CHOLECALCIFEROL, PO Take 1 tablet by mouth daily.    Marland Kitchen amLODipine (NORVASC) 10 MG tablet Take 1 tablet (10 mg total) by mouth daily. (Patient not taking: Reported on 03/19/2017) 90 tablet 3   No current  facility-administered medications on file prior to visit.    Allergies  Allergen Reactions  . Doxycycline Nausea Only  . Erythromycin Nausea Only  . Sulfonamide Derivatives Hives   Social History   Social History  . Marital status: Married    Spouse name: N/A  . Number of children: 3  . Years of education: N/A   Occupational History  . retired Marine scientist Retired   Social History Main Topics  . Smoking status: Former Smoker    Packs/day: 0.00    Years: 43.00    Types: Cigarettes  . Smokeless tobacco: Never Used  . Alcohol use 4.2 oz/week    7 Shots of liquor per week  . Drug use: No  . Sexual activity: Yes   Other Topics Concern  . Not on file   Social History Narrative  . No narrative on file   Family History  Problem Relation Age of Onset  . Graves' disease Daughter   . Breast cancer Maternal Aunt   . Heart disease Maternal Aunt   . Hypertension Mother   . Arthritis Maternal Grandmother      Review of Systems  All other systems reviewed and are negative.      Objective:   Physical Exam  Constitutional: She appears well-developed and well-nourished. No distress.  Neck: No JVD present.  Cardiovascular: Normal rate, regular rhythm, normal heart sounds and intact distal pulses.  Exam reveals no gallop and no friction rub.   No murmur heard. Pulmonary/Chest: Effort normal and breath sounds normal. No respiratory distress. She has no wheezes. She has no rales. She exhibits no tenderness.  Abdominal: Soft. Bowel sounds are normal. She exhibits no distension and no mass. There is no tenderness. There is no rebound and no guarding.  Musculoskeletal: She exhibits no edema.  Skin: She is not diaphoretic.  Vitals reviewed.         Assessment & Plan:  Benign essential HTN  Begin bystolic 10 mg by mouth daily and recheck blood pressure 1 month.

## 2017-03-27 ENCOUNTER — Other Ambulatory Visit: Payer: Self-pay | Admitting: Family Medicine

## 2017-03-27 DIAGNOSIS — Z1231 Encounter for screening mammogram for malignant neoplasm of breast: Secondary | ICD-10-CM

## 2017-03-30 ENCOUNTER — Telehealth: Payer: Self-pay | Admitting: Family Medicine

## 2017-03-30 NOTE — Telephone Encounter (Signed)
Ok to refill 

## 2017-03-30 NOTE — Telephone Encounter (Signed)
Pt needs refill on xanax.

## 2017-04-01 ENCOUNTER — Other Ambulatory Visit: Payer: Self-pay | Admitting: Family Medicine

## 2017-04-02 ENCOUNTER — Other Ambulatory Visit: Payer: Self-pay | Admitting: Family Medicine

## 2017-04-02 MED ORDER — ALPRAZOLAM 0.5 MG PO TABS: ORAL_TABLET | ORAL | 0 refills | Status: DC

## 2017-04-02 NOTE — Telephone Encounter (Signed)
Medication called/sent to requested pharmacy ([facility] per pt)

## 2017-04-02 NOTE — Telephone Encounter (Signed)
ok 

## 2017-04-06 ENCOUNTER — Ambulatory Visit: Payer: Medicare Other | Admitting: Family Medicine

## 2017-04-06 ENCOUNTER — Ambulatory Visit (INDEPENDENT_AMBULATORY_CARE_PROVIDER_SITE_OTHER): Payer: Medicare Other | Admitting: Family Medicine

## 2017-04-06 ENCOUNTER — Encounter: Payer: Self-pay | Admitting: Family Medicine

## 2017-04-06 VITALS — BP 172/98 | HR 76 | Temp 98.1°F | Resp 20 | Ht 67.5 in | Wt 162.0 lb

## 2017-04-06 DIAGNOSIS — I10 Essential (primary) hypertension: Secondary | ICD-10-CM | POA: Diagnosis not present

## 2017-04-06 MED ORDER — DOXAZOSIN MESYLATE 4 MG PO TABS: 4.0000 mg | ORAL_TABLET | Freq: Every day | ORAL | 3 refills | Status: DC

## 2017-04-06 NOTE — Progress Notes (Signed)
Subjective:    Patient ID: [name], female    DOB: 01/18/1941, 76 y.o.   MRN: [id number]  Hypertension    03/19/17 We had to discontinue the patient's amlodipine due to leg swelling. At present she is on Hyzaar 100/25 by mouth daily however her blood pressure has been averaging 409-811 systolic over 91-47 diastolic. She denies any chest pain. She denies any shortness of breath or dyspnea on exertion.  At that time, my plan was: Begin bystolic 10 mg by mouth daily and recheck blood pressure 1 month.  04/06/17 Ever since the patient has been on bystolic, her blood pressure has been out of control. Systolic blood pressures are ranging between 160 and 829 with diastolic blood pressures range between 80 and 100. She denies any chest pain shortness of breath or dyspnea on exertion. However she does report increasing levels of stress as she is caring for her husband who has had multiple strokes with significant brain damage and memory loss in addition to her father who is 75 years old and was recently removed from a nursing home. Immunization History  Administered Date(s) Administered  . Influenza,inj,Quad PF,36+ Mos 05/29/2014, 05/30/2016  . Influenza-Unspecified 06/23/2015  . Pneumococcal Conjugate-13 01/16/2014  . Pneumococcal Polysaccharide-23 07/03/2012  . Td 09/12/1993  . Tdap 12/21/2012  . Zoster 09/27/2007   Past Medical History:  Diagnosis Date  . Allergic rhinitis   . Anemia   . Anxiety   . Arrhythmia   . Arthritis    "hands, neck, right shoulder" (07/28/2015)  . Aseptic necrosis ([place])   . Asthma dxc'd 05/2015  . Basal cell cancer   . Bleeding stomach ulcer ~ 06/2005  . COPD (chronic obstructive pulmonary disease) ([place])   . Depression   . Diverticula of colon   . Esophagitis   . Gastric ulcer with hemorrhage   . GERD (gastroesophageal reflux disease)   . Heart murmur   . History of blood transfusion 06/2005   "related to pneumonia"  . Hyperlipidemia   .  Hypertension   . Hypothyroidism   . Kidney stones   . Lung cancer ([place])   . Mitral valve prolapse    "no ORs" (07/28/2015)  . Osteopenia   . Pneumonia 1970's; 06/2005  . Sleep apnea dx'd 06/2015   "haven't had followup appt yet" 07/28/2015)  . Squamous cell skin cancer    Past Surgical History:  Procedure Laterality Date  . ABDOMINAL HYSTERECTOMY  1988  . HAMMER TOE SURGERY Right ~ 2010  . SALPINGOOPHORECTOMY Left 1988  . SKIN CANCER EXCISION     "I've had severl cut off RLE; left foreqrm, nose under nose"  . THORACOTOMY Left 06/2005  . THORACOTOMY/LOBECTOMY  09/2009; 11/2009   left; right   Current Outpatient Prescriptions on File Prior to Visit  Medication Sig Dispense Refill  . acetaminophen (TYLENOL) 325 MG tablet Take by mouth.    . ADVAIR DISKUS 250-50 MCG/DOSE AEPB USE 1 INHALATION TWICE A DAY 180 each 3  . albuterol (PROVENTIL) (2.5 MG/3ML) 0.083% nebulizer solution USE 1 VIAL (3 ML OR 2.5 MG TOTAL) VIA NEBULIZER EVERY 4 HOURS AS NEEDED FOR WHEEZING OR SHORTNESS OF BREATH 450 mL 3  . ALPRAZolam (XANAX) 0.5 MG tablet TAKE 1 TABLET BY MOUTH EVERY DAY AT BEDTIME AS NEEDED ANXIETY OR INSOMNIA 90 tablet 0  . aspirin 81 MG tablet Take 81 mg by mouth daily.     Marland Kitchen b complex-C-folic acid 1 MG capsule Take 1 capsule by mouth daily.      Marland Kitchen  buPROPion (WELLBUTRIN XL) 150 MG 24 hr tablet TAKE 1 TABLET DAILY 90 tablet 3  . Calcium Carbonate-Vit D-Min (CALCIUM 1200 PO) Take by mouth.    . celecoxib (CELEBREX) 200 MG capsule TAKE 1 CAPSULE DAILY 90 capsule 2  . Cyanocobalamin 1000 MCG CAPS Take by mouth.    . DULoxetine (CYMBALTA) 60 MG capsule TAKE 1 CAPSULE DAILY 90 capsule 3  . fluticasone (FLONASE) 50 MCG/ACT nasal spray Place 2 sprays into both nostrils daily. 48 g 3  . Fluticasone-Salmeterol (ADVAIR DISKUS) 250-50 MCG/DOSE AEPB Inhale into the lungs.    . folic acid (FOLVITE) 1 MG tablet Take by mouth.    . levothyroxine (SYNTHROID, LEVOTHROID) 100 MCG tablet TAKE 1 TABLET EVERY  MORNING 90 tablet 2  . losartan-hydrochlorothiazide (HYZAAR) 100-25 MG tablet Take 1 tablet by mouth daily. 90 tablet 3  . omeprazole (PRILOSEC) 20 MG capsule TAKE 1 CAPSULE DAILY 90 capsule 1  . pravastatin (PRAVACHOL) 40 MG tablet Take 1 tablet (40 mg total) by mouth daily. Stop simvastatin 90 tablet 3  . traZODone (DESYREL) 50 MG tablet Take 1 tablet (50 mg total) by mouth at bedtime as needed for sleep. 60 tablet 1  . VITAMIN D, CHOLECALCIFEROL, PO Take 1 tablet by mouth daily.     No current facility-administered medications on file prior to visit.    Allergies  Allergen Reactions  . Doxycycline Nausea Only  . Erythromycin Nausea Only  . Sulfonamide Derivatives Hives   Social History   Social History  . Marital status: Married    Spouse name: N/A  . Number of children: 3  . Years of education: N/A   Occupational History  . retired Marine scientist Retired   Social History Main Topics  . Smoking status: Former Smoker    Packs/day: 0.00    Years: 43.00    Types: Cigarettes  . Smokeless tobacco: Never Used  . Alcohol use 4.2 oz/week    7 Shots of liquor per week  . Drug use: No  . Sexual activity: Yes   Other Topics Concern  . Not on file   Social History Narrative  . No narrative on file   Family History  Problem Relation Age of Onset  . Graves' disease Daughter   . Breast cancer Maternal Aunt   . Heart disease Maternal Aunt   . Hypertension Mother   . Arthritis Maternal Grandmother      Review of Systems  All other systems reviewed and are negative.      Objective:   Physical Exam  Constitutional: She appears well-developed and well-nourished. No distress.  Neck: No JVD present.  Cardiovascular: Normal rate, regular rhythm, normal heart sounds and intact distal pulses.  Exam reveals no gallop and no friction rub.   No murmur heard. Pulmonary/Chest: Effort normal and breath sounds normal. No respiratory distress. She has no wheezes. She has no rales. She  exhibits no tenderness.  Abdominal: Soft. Bowel sounds are normal. She exhibits no distension and no mass. There is no tenderness. There is no rebound and no guarding.  Musculoskeletal: She exhibits no edema.  Skin: She is not diaphoretic.  Vitals reviewed.         Assessment & Plan:  Benign essential HTN - Plan: BASIC METABOLIC PANEL WITH GFR  Bystolic has been ineffective. We will discontinue the medication and replace with Cardura 4 mg a day and recheck blood pressure in 2-3 weeks. Given the sudden change in her blood pressure, I will also check her  renal function pain close attention to her sodium and potassium levels for evidence of possible hyperaldosteronism as well as a decline in her renal function that was suggest possible renal artery stenosis. If blood pressure remains significantly elevated, we will need to evaluate for secondary causes of hypertension.

## 2017-04-07 LAB — BASIC METABOLIC PANEL WITH GFR
BUN: 15 mg/dL (ref 7–25)
CO2: 23 mmol/L (ref 20–31)
Calcium: 9.4 mg/dL (ref 8.6–10.4)
Chloride: 104 mmol/L (ref 98–110)
Creat: 0.88 mg/dL (ref 0.60–0.93)
GFR, Est African American: 75 mL/min (ref >=60)
GFR, Est Non African American: 65 mL/min (ref >=60)
Glucose, Bld: 90 mg/dL (ref 70–99)
Potassium: 4.7 mmol/L (ref 3.5–5.3)
Sodium: 141 mmol/L (ref 135–146)

## 2017-04-09 ENCOUNTER — Encounter: Payer: Self-pay | Admitting: Family Medicine

## 2017-04-23 ENCOUNTER — Other Ambulatory Visit: Payer: Self-pay | Admitting: Family Medicine

## 2017-04-23 DIAGNOSIS — H40013 Open angle with borderline findings, low risk, bilateral: Secondary | ICD-10-CM | POA: Diagnosis not present

## 2017-04-23 DIAGNOSIS — Z961 Presence of intraocular lens: Secondary | ICD-10-CM | POA: Diagnosis not present

## 2017-04-23 DIAGNOSIS — H35033 Hypertensive retinopathy, bilateral: Secondary | ICD-10-CM | POA: Diagnosis not present

## 2017-04-23 DIAGNOSIS — H04123 Dry eye syndrome of bilateral lacrimal glands: Secondary | ICD-10-CM | POA: Diagnosis not present

## 2017-04-23 MED ORDER — DOXAZOSIN MESYLATE 4 MG PO TABS: 4.0000 mg | ORAL_TABLET | Freq: Every day | ORAL | 1 refills | Status: DC

## 2017-04-23 MED ORDER — ALPRAZOLAM 0.5 MG PO TABS: ORAL_TABLET | ORAL | 0 refills | Status: DC

## 2017-04-23 NOTE — Telephone Encounter (Signed)
ok 

## 2017-04-23 NOTE — Telephone Encounter (Signed)
RX faxed to [facility]

## 2017-04-23 NOTE — Telephone Encounter (Signed)
Request refill Alprazolam to mail order pharmacy.  LRF 04/02/17 #90 no refills.  OK send refill to mail order.

## 2017-05-03 ENCOUNTER — Ambulatory Visit (INDEPENDENT_AMBULATORY_CARE_PROVIDER_SITE_OTHER): Payer: Medicare Other | Admitting: Family Medicine

## 2017-05-03 ENCOUNTER — Encounter: Payer: Self-pay | Admitting: Family Medicine

## 2017-05-03 VITALS — BP 130/80 | HR 76 | Temp 98.5°F | Resp 20 | Ht 67.5 in | Wt 166.0 lb

## 2017-05-03 DIAGNOSIS — F988 Other specified behavioral and emotional disorders with onset usually occurring in childhood and adolescence: Secondary | ICD-10-CM

## 2017-05-03 DIAGNOSIS — R194 Change in bowel habit: Secondary | ICD-10-CM | POA: Diagnosis not present

## 2017-05-03 MED ORDER — AMPHETAMINE-DEXTROAMPHET ER 20 MG PO CP24: 20.0000 mg | ORAL_CAPSULE | ORAL | 0 refills | Status: DC

## 2017-05-03 NOTE — Progress Notes (Signed)
Subjective:    Patient ID: [name], female    DOB: 1941/04/10, 76 y.o.   MRN: [id number]  Hypertension    The patient is under a lot of stress recently. She reporst increasing levels of stress as she is caring for her husband who has had multiple strokes with significant brain damage and memory loss in addition to her father who is 37 years old and was recently removed from a nursing home.  Therefore she believes she needs to change her antidepressants. She has been on Cymbalta for several years in addition to Wellbutrin. She does report anxiety but she denies any depression at the present time. Her biggest concern is memory loss. She believes this is likely attributed to depression. When I questioned the patient further she provided several examples. For instance, this week, the patient drove her husband to [facility] for an appointment and then realized his appointment was actually in [place]. She finds herself making frequent mistakes like this. When I questioned her further, she reports that she has some much going on and so many things to keep track of, is hard for her to keep "straight" what she is supposed to be doing. She also frequently loses items such as her hearing aids her glasses in her car keys. She also lost a credit card or earlier this week. I have long question if the patient has ADD. Respectfully, the patient talks extremely fast and frequently changes subjects during our visit. Often it is hard to keep track of her questions. Her mind seems to jump from one question to another. I also have to frequently repeat myself as if she is not paying attention or processing what I'm telling her. Many times when I am answering the question she just asked, she will seem distracted. Sometimes I believe she's thinking of another question rather than focusing on the answer. Her daughter has ADD. Her grandson has ADD. Just this week, she is "forgetting assignments". She is demonstrating poor  Corporate treasurer. She is frequently losing items. However her orientation is completely normal. She is able to tell me the month and year day and date accurately. She can perform serial sevens. Her other biggest concern is change in bowel habits. She states that over the last 2 months, she is having daily diarrhea. Previously she would have 1 bowel movement a day there be well formed and normal. Now she is having 3 and 4 watery stools every day. There is also a foul odor and frequent gas and bloating. She also has occasional abdominal cramps. She denies any blood in her stool or hematochezia. She has no documented history of irritable bowel syndrome but she has independently tried a probiotic for more than a month without any benefit. Symptoms sound consistent with IBS although I cannot rule out inflammatory colitis such as lymphocytic colitis. Immunization History  Administered Date(s) Administered  . Influenza,inj,Quad PF,6+ Mos 05/29/2014, 05/30/2016  . Influenza-Unspecified 06/23/2015  . Pneumococcal Conjugate-13 01/16/2014  . Pneumococcal Polysaccharide-23 07/03/2012  . Td 09/12/1993  . Tdap 12/21/2012  . Zoster 09/27/2007   Past Medical History:  Diagnosis Date  . Allergic rhinitis   . Anemia   . Anxiety   . Arrhythmia   . Arthritis    "hands, neck, right shoulder" (07/28/2015)  . Aseptic necrosis ([place])   . Asthma dxc'd 05/2015  . Basal cell cancer   . Bleeding stomach ulcer ~ 06/2005  . COPD (chronic obstructive pulmonary disease) ([place])   . Depression   .  Diverticula of colon   . Esophagitis   . Gastric ulcer with hemorrhage   . GERD (gastroesophageal reflux disease)   . Heart murmur   . History of blood transfusion 06/2005   "related to pneumonia"  . Hyperlipidemia   . Hypertension   . Hypothyroidism   . Kidney stones   . Lung cancer ([place])   . Mitral valve prolapse    "no ORs" (07/28/2015)  . Osteopenia   . Pneumonia 1970's; 06/2005  . Sleep apnea dx'd 06/2015    "haven't had followup appt yet" 07/28/2015)  . Squamous cell skin cancer    Past Surgical History:  Procedure Laterality Date  . ABDOMINAL HYSTERECTOMY  1988  . HAMMER TOE SURGERY Right ~ 2010  . SALPINGOOPHORECTOMY Left 1988  . SKIN CANCER EXCISION     "I've had severl cut off RLE; left foreqrm, nose under nose"  . THORACOTOMY Left 06/2005  . THORACOTOMY/LOBECTOMY  09/2009; 11/2009   left; right   Current Outpatient Prescriptions on File Prior to Visit  Medication Sig Dispense Refill  . acetaminophen (TYLENOL) 325 MG tablet Take by mouth.    . ADVAIR DISKUS 250-50 MCG/DOSE AEPB USE 1 INHALATION TWICE A DAY 180 each 3  . albuterol (PROVENTIL) (2.5 MG/3ML) 0.083% nebulizer solution USE 1 VIAL (3 ML OR 2.5 MG TOTAL) VIA NEBULIZER EVERY 4 HOURS AS NEEDED FOR WHEEZING OR SHORTNESS OF BREATH 450 mL 3  . ALPRAZolam (XANAX) 0.5 MG tablet TAKE 1 TABLET BY MOUTH EVERY DAY AT BEDTIME AS NEEDED ANXIETY OR INSOMNIA 90 tablet 0  . aspirin 81 MG tablet Take 81 mg by mouth daily.     Marland Kitchen b complex-C-folic acid 1 MG capsule Take 1 capsule by mouth daily.      Marland Kitchen buPROPion (WELLBUTRIN XL) 150 MG 24 hr tablet TAKE 1 TABLET DAILY 90 tablet 3  . Calcium Carbonate-Vit D-Min (CALCIUM 1200 PO) Take by mouth.    . celecoxib (CELEBREX) 200 MG capsule TAKE 1 CAPSULE DAILY 90 capsule 2  . Cyanocobalamin 1000 MCG CAPS Take by mouth.    . doxazosin (CARDURA) 4 MG tablet Take 1 tablet (4 mg total) by mouth daily. 90 tablet 1  . DULoxetine (CYMBALTA) 60 MG capsule TAKE 1 CAPSULE DAILY 90 capsule 3  . fluticasone (FLONASE) 50 MCG/ACT nasal spray Place 2 sprays into both nostrils daily. 48 g 3  . Fluticasone-Salmeterol (ADVAIR DISKUS) 250-50 MCG/DOSE AEPB Inhale into the lungs.    . folic acid (FOLVITE) 1 MG tablet Take by mouth.    . levothyroxine (SYNTHROID, LEVOTHROID) 100 MCG tablet TAKE 1 TABLET EVERY MORNING 90 tablet 2  . losartan-hydrochlorothiazide (HYZAAR) 100-25 MG tablet Take 1 tablet by mouth daily. 90 tablet  3  . omeprazole (PRILOSEC) 20 MG capsule TAKE 1 CAPSULE DAILY 90 capsule 1  . pravastatin (PRAVACHOL) 40 MG tablet Take 1 tablet (40 mg total) by mouth daily. Stop simvastatin 90 tablet 3  . traZODone (DESYREL) 50 MG tablet Take 1 tablet (50 mg total) by mouth at bedtime as needed for sleep. 60 tablet 1  . VITAMIN D, CHOLECALCIFEROL, PO Take 1 tablet by mouth daily.     No current facility-administered medications on file prior to visit.    Allergies  Allergen Reactions  . Doxycycline Nausea Only  . Erythromycin Nausea Only  . Sulfonamide Derivatives Hives   Social History   Social History  . Marital status: Married    Spouse name: N/A  . Number of children: 3  .  Years of education: N/A   Occupational History  . retired Marine scientist Retired   Social History Main Topics  . Smoking status: Former Smoker    Packs/day: 0.00    Years: 43.00    Types: Cigarettes  . Smokeless tobacco: Never Used  . Alcohol use 4.2 oz/week    7 Shots of liquor per week  . Drug use: No  . Sexual activity: Yes   Other Topics Concern  . Not on file   Social History Narrative  . No narrative on file   Family History  Problem Relation Age of Onset  . Graves' disease Daughter   . Breast cancer Maternal Aunt   . Heart disease Maternal Aunt   . Hypertension Mother   . Arthritis Maternal Grandmother      Review of Systems  All other systems reviewed and are negative.      Objective:   Physical Exam  Constitutional: She appears well-developed and well-nourished. No distress.  Neck: No JVD present.  Cardiovascular: Normal rate, regular rhythm, normal heart sounds and intact distal pulses.  Exam reveals no gallop and no friction rub.   No murmur heard. Pulmonary/Chest: Effort normal and breath sounds normal. No respiratory distress. She has no wheezes. She has no rales. She exhibits no tenderness.  Abdominal: Soft. Bowel sounds are normal. She exhibits no distension and no mass. There is no  tenderness. There is no rebound and no guarding.  Musculoskeletal: She exhibits no edema.  Skin: She is not diaphoretic.  Vitals reviewed.         Assessment & Plan:  ADD (attention deficit disorder) without hyperactivity  Change in bowel habit - Plan: Ambulatory referral to Gastroenterology  After having known this patient now for almost a decade, I believe that the patient has long had ADD and that it is now under all the stress, is becoming more and more of a problem for her. I recommended trying Adderall XR 20 mg by mouth every morning and reassessing in 2-3 weeks to see if she notices improvement. We could also consider changing her antidepressant medications although I see more of a problem in her memory due to focus and stress and anxiety rather than depression. Patient would like to see a gastroenterologist to discuss or change in bowel habits.I believe is more likely IBS. She denies any blood in her stool. However the patient would like to have a colonoscopy to rule out inflammatory bowel disease

## 2017-05-04 ENCOUNTER — Encounter: Payer: Self-pay | Admitting: Family Medicine

## 2017-05-08 DIAGNOSIS — C3412 Malignant neoplasm of upper lobe, left bronchus or lung: Secondary | ICD-10-CM | POA: Diagnosis not present

## 2017-05-08 DIAGNOSIS — R918 Other nonspecific abnormal finding of lung field: Secondary | ICD-10-CM | POA: Insufficient documentation

## 2017-05-08 DIAGNOSIS — Z9889 Other specified postprocedural states: Secondary | ICD-10-CM | POA: Diagnosis not present

## 2017-05-08 DIAGNOSIS — Z87891 Personal history of nicotine dependence: Secondary | ICD-10-CM | POA: Diagnosis not present

## 2017-05-08 DIAGNOSIS — C3411 Malignant neoplasm of upper lobe, right bronchus or lung: Secondary | ICD-10-CM | POA: Diagnosis not present

## 2017-05-08 DIAGNOSIS — Z08 Encounter for follow-up examination after completed treatment for malignant neoplasm: Secondary | ICD-10-CM | POA: Diagnosis not present

## 2017-05-08 DIAGNOSIS — Z9221 Personal history of antineoplastic chemotherapy: Secondary | ICD-10-CM | POA: Diagnosis not present

## 2017-05-08 DIAGNOSIS — Z902 Acquired absence of lung [part of]: Secondary | ICD-10-CM | POA: Diagnosis not present

## 2017-05-08 DIAGNOSIS — Z85118 Personal history of other malignant neoplasm of bronchus and lung: Secondary | ICD-10-CM | POA: Diagnosis not present

## 2017-05-28 ENCOUNTER — Ambulatory Visit (INDEPENDENT_AMBULATORY_CARE_PROVIDER_SITE_OTHER): Payer: Medicare Other | Admitting: Family Medicine

## 2017-05-28 ENCOUNTER — Encounter: Payer: Self-pay | Admitting: Family Medicine

## 2017-05-28 VITALS — BP 118/72 | HR 62 | Temp 98.0°F | Resp 16 | Ht 67.0 in | Wt 164.0 lb

## 2017-05-28 DIAGNOSIS — Z23 Encounter for immunization: Secondary | ICD-10-CM | POA: Diagnosis not present

## 2017-05-28 DIAGNOSIS — F988 Other specified behavioral and emotional disorders with onset usually occurring in childhood and adolescence: Secondary | ICD-10-CM

## 2017-05-28 MED ORDER — AMPHETAMINE-DEXTROAMPHET ER 20 MG PO CP24: 20.0000 mg | ORAL_CAPSULE | ORAL | 0 refills | Status: DC

## 2017-05-28 NOTE — Progress Notes (Signed)
Subjective:    Patient ID: [name], female    DOB: February 13, 1941, 76 y.o.   MRN: [id number]  Hypertension   Abdominal Pain     04/2017 The patient is under a lot of stress recently. She reporst increasing levels of stress as she is caring for her husband who has had multiple strokes with significant brain damage and memory loss in addition to her father who is 52 years old and was recently removed from a nursing home.  Therefore she believes she needs to change her antidepressants. She has been on Cymbalta for several years in addition to Wellbutrin. She does report anxiety but she denies any depression at the present time. Her biggest concern is memory loss. She believes this is likely attributed to depression. When I questioned the patient further she provided several examples. For instance, this week, the patient drove her husband to [facility] for an appointment and then realized his appointment was actually in [place]. She finds herself making frequent mistakes like this. When I questioned her further, she reports that she has some much going on and so many things to keep track of, is hard for her to keep "straight" what she is supposed to be doing. She also frequently loses items such as her hearing aids her glasses in her car keys. She also lost a credit card or earlier this week. I have long question if the patient has ADD. Respectfully, the patient talks extremely fast and frequently changes subjects during our visit. Often it is hard to keep track of her questions. Her mind seems to jump from one question to another. I also have to frequently repeat myself as if she is not paying attention or processing what I'm telling her. Many times when I am answering the question she just asked, she will seem distracted. Sometimes I believe she's thinking of another question rather than focusing on the answer. Her daughter has ADD. Her grandson has ADD. Just this week, she is "forgetting assignments".  She is demonstrating poor Corporate treasurer. She is frequently losing items. However her orientation is completely normal. She is able to tell me the month and year day and date accurately. She can perform serial sevens. Her other biggest concern is change in bowel habits. She states that over the last 2 months, she is having daily diarrhea. Previously she would have 1 bowel movement a day there be well formed and normal. Now she is having 3 and 4 watery stools every day. There is also a foul odor and frequent gas and bloating. She also has occasional abdominal cramps. She denies any blood in her stool or hematochezia. She has no documented history of irritable bowel syndrome but she has independently tried a probiotic for more than a month without any benefit. Symptoms sound consistent with IBS although I cannot rule out inflammatory colitis such as lymphocytic colitis.  At that time, ,my plan was: After having known this patient now for almost a decade, I believe that the patient has long had ADD and that it is now under all the stress, is becoming more and more of a problem for her. I recommended trying Adderall XR 20 mg by mouth every morning and reassessing in 2-3 weeks to see if she notices improvement. We could also consider changing her antidepressant medications although I see more of a problem in her memory due to focus and stress and anxiety rather than depression. Patient would like to see a gastroenterologist to discuss or change  in bowel habits.I believe is more likely IBS. She denies any blood in her stool. However the patient would like to have a colonoscopy to rule out inflammatory bowel disease   05/28/17 Patient is much calmer today. She states that she has done markedly better since starting Adderall. Her thoughts are not racing. She is focusing better. She is noted improvement in her memory due to the fact she's paying attention to more. She has not had any further episodes where she  forgot doctor's appointments or other critical information. Her organization has improved at home. She is not losing items.  Her family has also noted substantial improvement including her daughter. Immunization History  Administered Date(s) Administered  . Influenza,inj,Quad PF,6+ Mos 05/29/2014, 05/30/2016, 05/28/2017  . Influenza-Unspecified 06/23/2015  . Pneumococcal Conjugate-13 01/16/2014  . Pneumococcal Polysaccharide-23 07/03/2012  . Td 09/12/1993  . Tdap 12/21/2012  . Zoster 09/27/2007   Past Medical History:  Diagnosis Date  . Allergic rhinitis   . Anemia   . Anxiety   . Arrhythmia   . Arthritis    "hands, neck, right shoulder" (07/28/2015)  . Aseptic necrosis ([place])   . Asthma dxc'd 05/2015  . Basal cell cancer   . Bleeding stomach ulcer ~ 06/2005  . COPD (chronic obstructive pulmonary disease) ([place])   . Depression   . Diverticula of colon   . Esophagitis   . Gastric ulcer with hemorrhage   . GERD (gastroesophageal reflux disease)   . Heart murmur   . History of blood transfusion 06/2005   "related to pneumonia"  . Hyperlipidemia   . Hypertension   . Hypothyroidism   . Kidney stones   . Lung cancer ([place])   . Mitral valve prolapse    "no ORs" (07/28/2015)  . Osteopenia   . Pneumonia 1970's; 06/2005  . Sleep apnea dx'd 06/2015   "haven't had followup appt yet" 07/28/2015)  . Squamous cell skin cancer    Past Surgical History:  Procedure Laterality Date  . ABDOMINAL HYSTERECTOMY  1988  . HAMMER TOE SURGERY Right ~ 2010  . SALPINGOOPHORECTOMY Left 1988  . SKIN CANCER EXCISION     "I've had severl cut off RLE; left foreqrm, nose under nose"  . THORACOTOMY Left 06/2005  . THORACOTOMY/LOBECTOMY  09/2009; 11/2009   left; right   Current Outpatient Prescriptions on File Prior to Visit  Medication Sig Dispense Refill  . acetaminophen (TYLENOL) 325 MG tablet Take by mouth.    Marland Kitchen albuterol (PROVENTIL) (2.5 MG/3ML) 0.083% nebulizer solution USE 1 VIAL (3 ML OR 2.5  MG TOTAL) VIA NEBULIZER EVERY 4 HOURS AS NEEDED FOR WHEEZING OR SHORTNESS OF BREATH 450 mL 3  . ALPRAZolam (XANAX) 0.5 MG tablet TAKE 1 TABLET BY MOUTH EVERY DAY AT BEDTIME AS NEEDED ANXIETY OR INSOMNIA 90 tablet 0  . aspirin 81 MG tablet Take 81 mg by mouth daily.     Marland Kitchen b complex-C-folic acid 1 MG capsule Take 1 capsule by mouth daily.      Marland Kitchen buPROPion (WELLBUTRIN XL) 150 MG 24 hr tablet TAKE 1 TABLET DAILY 90 tablet 3  . Calcium Carbonate-Vit D-Min (CALCIUM 1200 PO) Take by mouth.    . celecoxib (CELEBREX) 200 MG capsule TAKE 1 CAPSULE DAILY 90 capsule 2  . Cyanocobalamin 1000 MCG CAPS Take by mouth.    . doxazosin (CARDURA) 4 MG tablet Take 1 tablet (4 mg total) by mouth daily. 90 tablet 1  . DULoxetine (CYMBALTA) 60 MG capsule TAKE 1 CAPSULE DAILY 90 capsule  3  . fluticasone (FLONASE) 50 MCG/ACT nasal spray Place 2 sprays into both nostrils daily. 48 g 3  . Fluticasone-Salmeterol (ADVAIR DISKUS) 250-50 MCG/DOSE AEPB Inhale into the lungs.    . folic acid (FOLVITE) 1 MG tablet Take by mouth.    . levothyroxine (SYNTHROID, LEVOTHROID) 100 MCG tablet TAKE 1 TABLET EVERY MORNING 90 tablet 2  . losartan-hydrochlorothiazide (HYZAAR) 100-25 MG tablet Take 1 tablet by mouth daily. 90 tablet 3  . omeprazole (PRILOSEC) 20 MG capsule TAKE 1 CAPSULE DAILY 90 capsule 1  . pravastatin (PRAVACHOL) 40 MG tablet Take 1 tablet (40 mg total) by mouth daily. Stop simvastatin 90 tablet 3  . traZODone (DESYREL) 50 MG tablet Take 1 tablet (50 mg total) by mouth at bedtime as needed for sleep. 60 tablet 1  . VITAMIN D, CHOLECALCIFEROL, PO Take 1 tablet by mouth daily.     No current facility-administered medications on file prior to visit.    Allergies  Allergen Reactions  . Doxycycline Nausea Only  . Erythromycin Nausea Only  . Sulfonamide Derivatives Hives   Social History   Social History  . Marital status: Married    Spouse name: N/A  . Number of children: 3  . Years of education: N/A    Occupational History  . retired Marine scientist Retired   Social History Main Topics  . Smoking status: Former Smoker    Packs/day: 0.00    Years: 43.00    Types: Cigarettes  . Smokeless tobacco: Never Used  . Alcohol use 4.2 oz/week    7 Shots of liquor per week  . Drug use: No  . Sexual activity: Yes   Other Topics Concern  . Not on file   Social History Narrative  . No narrative on file   Family History  Problem Relation Age of Onset  . Graves' disease Daughter   . Breast cancer Maternal Aunt   . Heart disease Maternal Aunt   . Hypertension Mother   . Arthritis Maternal Grandmother      Review of Systems  Gastrointestinal: Positive for abdominal pain.  All other systems reviewed and are negative.      Objective:   Physical Exam  Constitutional: She appears well-developed and well-nourished. No distress.  Neck: No JVD present.  Cardiovascular: Normal rate, regular rhythm, normal heart sounds and intact distal pulses.  Exam reveals no gallop and no friction rub.   No murmur heard. Pulmonary/Chest: Effort normal and breath sounds normal. No respiratory distress. She has no wheezes. She has no rales. She exhibits no tenderness.  Abdominal: Soft. Bowel sounds are normal. She exhibits no distension and no mass. There is no tenderness. There is no rebound and no guarding.  Musculoskeletal: She exhibits no edema.  Skin: She is not diaphoretic.  Vitals reviewed.         Assessment & Plan:  ADD (attention deficit disorder) without hyperactivity  Need for immunization against influenza - Plan: Flu Vaccine QUAD 36+ mos IM  Continue Adderall XR 20 mg by mouth every morning. Reassess in 6 months. Blood pressure is excellent today. Patient received her flu shot as well.

## 2017-06-08 ENCOUNTER — Other Ambulatory Visit: Payer: Self-pay | Admitting: Family Medicine

## 2017-06-13 ENCOUNTER — Other Ambulatory Visit: Payer: Self-pay | Admitting: Gastroenterology

## 2017-06-13 DIAGNOSIS — R197 Diarrhea, unspecified: Secondary | ICD-10-CM | POA: Diagnosis not present

## 2017-06-13 DIAGNOSIS — I10 Essential (primary) hypertension: Secondary | ICD-10-CM | POA: Diagnosis not present

## 2017-06-13 DIAGNOSIS — K219 Gastro-esophageal reflux disease without esophagitis: Secondary | ICD-10-CM | POA: Diagnosis not present

## 2017-06-14 ENCOUNTER — Telehealth: Payer: Self-pay | Admitting: Family Medicine

## 2017-06-14 ENCOUNTER — Encounter (HOSPITAL_COMMUNITY): Payer: Self-pay | Admitting: *Deleted

## 2017-06-14 NOTE — Telephone Encounter (Signed)
Patient would like handicap placard updated if possible  [phone] when ready

## 2017-06-15 ENCOUNTER — Ambulatory Visit (HOSPITAL_COMMUNITY): Payer: Medicare Other | Admitting: Anesthesiology

## 2017-06-15 ENCOUNTER — Telehealth: Payer: Self-pay

## 2017-06-15 ENCOUNTER — Ambulatory Visit (HOSPITAL_COMMUNITY)
Admission: RE | Admit: 2017-06-15 | Discharge: 2017-06-15 | Disposition: A | Discharge status: Home / Self Care | Payer: Medicare Other | Source: Ambulatory Visit | Attending: Gastroenterology | Admitting: Gastroenterology

## 2017-06-15 ENCOUNTER — Encounter (HOSPITAL_COMMUNITY)
Admission: RE | Disposition: A | Discharge status: Home / Self Care | Payer: Self-pay | Source: Ambulatory Visit | Attending: Gastroenterology

## 2017-06-15 DIAGNOSIS — M8580 Other specified disorders of bone density and structure, unspecified site: Secondary | ICD-10-CM | POA: Insufficient documentation

## 2017-06-15 DIAGNOSIS — M19042 Primary osteoarthritis, left hand: Secondary | ICD-10-CM | POA: Insufficient documentation

## 2017-06-15 DIAGNOSIS — Z90710 Acquired absence of both cervix and uterus: Secondary | ICD-10-CM | POA: Insufficient documentation

## 2017-06-15 DIAGNOSIS — I341 Nonrheumatic mitral (valve) prolapse: Secondary | ICD-10-CM | POA: Diagnosis not present

## 2017-06-15 DIAGNOSIS — E039 Hypothyroidism, unspecified: Secondary | ICD-10-CM | POA: Diagnosis not present

## 2017-06-15 DIAGNOSIS — R197 Diarrhea, unspecified: Secondary | ICD-10-CM | POA: Diagnosis not present

## 2017-06-15 DIAGNOSIS — Z881 Allergy status to other antibiotic agents status: Secondary | ICD-10-CM | POA: Insufficient documentation

## 2017-06-15 DIAGNOSIS — M19041 Primary osteoarthritis, right hand: Secondary | ICD-10-CM | POA: Insufficient documentation

## 2017-06-15 DIAGNOSIS — Z87442 Personal history of urinary calculi: Secondary | ICD-10-CM | POA: Diagnosis not present

## 2017-06-15 DIAGNOSIS — G4730 Sleep apnea, unspecified: Secondary | ICD-10-CM | POA: Insufficient documentation

## 2017-06-15 DIAGNOSIS — Z85118 Personal history of other malignant neoplasm of bronchus and lung: Secondary | ICD-10-CM | POA: Insufficient documentation

## 2017-06-15 DIAGNOSIS — I493 Ventricular premature depolarization: Secondary | ICD-10-CM | POA: Diagnosis not present

## 2017-06-15 DIAGNOSIS — J449 Chronic obstructive pulmonary disease, unspecified: Secondary | ICD-10-CM | POA: Diagnosis not present

## 2017-06-15 DIAGNOSIS — Z9981 Dependence on supplemental oxygen: Secondary | ICD-10-CM | POA: Diagnosis not present

## 2017-06-15 DIAGNOSIS — K219 Gastro-esophageal reflux disease without esophagitis: Secondary | ICD-10-CM | POA: Diagnosis not present

## 2017-06-15 DIAGNOSIS — Z79899 Other long term (current) drug therapy: Secondary | ICD-10-CM | POA: Insufficient documentation

## 2017-06-15 DIAGNOSIS — I10 Essential (primary) hypertension: Secondary | ICD-10-CM | POA: Insufficient documentation

## 2017-06-15 DIAGNOSIS — F329 Major depressive disorder, single episode, unspecified: Secondary | ICD-10-CM | POA: Insufficient documentation

## 2017-06-15 DIAGNOSIS — Z85828 Personal history of other malignant neoplasm of skin: Secondary | ICD-10-CM | POA: Diagnosis not present

## 2017-06-15 DIAGNOSIS — M47812 Spondylosis without myelopathy or radiculopathy, cervical region: Secondary | ICD-10-CM | POA: Insufficient documentation

## 2017-06-15 DIAGNOSIS — C3490 Malignant neoplasm of unspecified part of unspecified bronchus or lung: Secondary | ICD-10-CM | POA: Diagnosis not present

## 2017-06-15 DIAGNOSIS — Z87891 Personal history of nicotine dependence: Secondary | ICD-10-CM | POA: Diagnosis not present

## 2017-06-15 DIAGNOSIS — F419 Anxiety disorder, unspecified: Secondary | ICD-10-CM | POA: Insufficient documentation

## 2017-06-15 DIAGNOSIS — E785 Hyperlipidemia, unspecified: Secondary | ICD-10-CM | POA: Diagnosis not present

## 2017-06-15 DIAGNOSIS — R011 Cardiac murmur, unspecified: Secondary | ICD-10-CM | POA: Diagnosis not present

## 2017-06-15 DIAGNOSIS — Z8719 Personal history of other diseases of the digestive system: Secondary | ICD-10-CM | POA: Insufficient documentation

## 2017-06-15 DIAGNOSIS — Z7982 Long term (current) use of aspirin: Secondary | ICD-10-CM | POA: Insufficient documentation

## 2017-06-15 DIAGNOSIS — M19011 Primary osteoarthritis, right shoulder: Secondary | ICD-10-CM | POA: Diagnosis not present

## 2017-06-15 DIAGNOSIS — Z882 Allergy status to sulfonamides status: Secondary | ICD-10-CM | POA: Insufficient documentation

## 2017-06-15 DIAGNOSIS — K922 Gastrointestinal hemorrhage, unspecified: Secondary | ICD-10-CM | POA: Diagnosis not present

## 2017-06-15 DIAGNOSIS — Z902 Acquired absence of lung [part of]: Secondary | ICD-10-CM | POA: Insufficient documentation

## 2017-06-15 HISTORY — DX: Personal history of urinary calculi: Z87.442

## 2017-06-15 HISTORY — DX: Dyspnea, unspecified: R06.00

## 2017-06-15 HISTORY — PX: COLONOSCOPY WITH PROPOFOL: SHX5780

## 2017-06-15 SURGERY — COLONOSCOPY WITH PROPOFOL
Anesthesia: Monitor Anesthesia Care

## 2017-06-15 MED ORDER — SODIUM CHLORIDE 0.9 % IV SOLN: INTRAVENOUS | Status: DC

## 2017-06-15 MED ORDER — PROPOFOL 10 MG/ML IV BOLUS
INTRAVENOUS | Status: DC | PRN
  Administered 2017-06-15 (×8): 20 mg via INTRAVENOUS
  Administered 2017-06-15: 10 mg via INTRAVENOUS
  Administered 2017-06-15 (×5): 20 mg via INTRAVENOUS
  Administered 2017-06-15: 40 mg via INTRAVENOUS
  Administered 2017-06-15 (×2): 20 mg via INTRAVENOUS

## 2017-06-15 MED ORDER — MEPERIDINE HCL 25 MG/ML IJ SOLN: 6.2500 mg | INTRAMUSCULAR | Status: DC | PRN

## 2017-06-15 MED ORDER — LACTATED RINGERS IV SOLN
INTRAVENOUS | Status: DC | PRN
  Administered 2017-06-15: 12:00:00 via INTRAVENOUS

## 2017-06-15 MED ORDER — PROPOFOL 10 MG/ML IV BOLUS
INTRAVENOUS | Status: AC
  Filled 2017-06-15: qty 40

## 2017-06-15 MED ORDER — MIDAZOLAM HCL 2 MG/2ML IJ SOLN: 0.5000 mg | Freq: Once | INTRAMUSCULAR | Status: DC | PRN

## 2017-06-15 MED ORDER — PROMETHAZINE HCL 25 MG/ML IJ SOLN: 6.2500 mg | INTRAMUSCULAR | Status: DC | PRN

## 2017-06-15 SURGICAL SUPPLY — 22 items

## 2017-06-15 NOTE — Op Note (Signed)
[facility] Patient Name: [name] Procedure Date: 06/15/2017 MRN: [id number] Attending MD: Carol Ada , MD Date of Birth: 10/27/1940 CSN: [id number] Age: 76 Admit Type: Outpatient Procedure:                Colonoscopy Indications:              Clinically significant diarrhea of unexplained                            origin Providers:                Carol Ada, MD, Cleda Daub, RN, William Dalton, Technician Referring MD:              Medicines:                Propofol per Anesthesia Complications:            No immediate complications. Estimated Blood Loss:     Estimated blood loss was minimal. Procedure:                Pre-Anesthesia Assessment:                           - Prior to the procedure, a History and Physical                            was performed, and patient medications and                            allergies were reviewed. The patient's tolerance of                            previous anesthesia was also reviewed. The risks                            and benefits of the procedure and the sedation                            options and risks were discussed with the patient.                            All questions were answered, and informed consent                            was obtained. Prior Anticoagulants: The patient has                            taken no previous anticoagulant or antiplatelet                            agents. ASA Grade Assessment: III - A patient with                            severe systemic disease. After reviewing  the risks                            and benefits, the patient was deemed in                            satisfactory condition to undergo the procedure.                           - Sedation was administered by an anesthesia                            professional. Deep sedation was attained.                           After obtaining informed consent, the colonoscope                         was passed under direct vision. Throughout the                            procedure, the patient's blood pressure, pulse, and                            oxygen saturations were monitored continuously. The                            EC-[id number] ([id number]) scope was introduced through                            the anus and advanced to the the cecum, identified                            by appendiceal orifice and ileocecal valve. The                            colonoscopy was somewhat difficult due to patient                            wretching with colonic insertion. Successful                            completion of the procedure was aided by using                            manual pressure and straightening and shortening                            the scope to obtain bowel loop reduction. The                            patient tolerated the procedure well. The quality                            of the bowel  preparation was excellent. The                            ileocecal valve, appendiceal orifice, and rectum                            were photographed. Scope In: 12:36:03 PM Scope Out: 12:53:06 PM Scope Withdrawal Time: 0 hours 6 minutes 52 seconds  Total Procedure Duration: 0 hours 17 minutes 3 seconds  Findings:      Normal mucosa was found in the entire colon. Biopsies for histology were       taken with a cold forceps from the entire colon for evaluation of       microscopic colitis. Impression:               - Normal mucosa in the entire examined colon.                            Biopsied. Moderate Sedation:      N/A- Per Anesthesia Care Recommendation:           - Patient has a contact number available for                            emergencies. The signs and symptoms of potential                            delayed complications were discussed with the                            patient. Return to normal activities tomorrow.                             Written discharge instructions were provided to the                            patient.                           - Resume previous diet.                           - Continue present medications.                           - Await pathology results.                           - No repeat colonoscopy due to age and the absence                            of colonic polyps.                           - Follow up in the office in one month. Procedure Code(s):        --- Professional ---  45380, Colonoscopy, flexible; with biopsy, single                            or multiple Diagnosis Code(s):        --- Professional ---                           R19.7, Diarrhea, unspecified CPT copyright 2016 American Medical Association. All rights reserved. The codes documented in this report are preliminary and upon coder review may  be revised to meet current compliance requirements. Carol Ada, MD Carol Ada, MD 06/15/2017 12:57:51 PM This report has been signed electronically. Number of Addenda: 0

## 2017-06-15 NOTE — Telephone Encounter (Signed)
Ok, bring me one and I'll fill out.

## 2017-06-15 NOTE — Telephone Encounter (Signed)
lvm that patient physician verification form for [employer] is avail for pick up

## 2017-06-15 NOTE — Anesthesia Postprocedure Evaluation (Signed)
Anesthesia Post Note  Patient: FRED HAMMES  Procedure(s) Performed: COLONOSCOPY WITH PROPOFOL (N/A )     Patient location during evaluation: Endoscopy Anesthesia Type: MAC Level of consciousness: awake and alert, patient cooperative and oriented Pain management: pain level controlled Vital Signs Assessment: post-procedure vital signs reviewed and stable Respiratory status: spontaneous breathing, nonlabored ventilation and respiratory function stable Cardiovascular status: blood pressure returned to baseline and stable Postop Assessment: no apparent nausea or vomiting Anesthetic complications: no    Last Vitals:  Vitals:   06/15/17 1209 06/15/17 1257  BP: (!) (P) 154/82 (!) 135/52  Pulse: (P) 82 76  Resp: (P) 19 (P) 20  Temp:  (P) 36.4 C  SpO2: 99% 99%    Last Pain:  Vitals:   06/15/17 1257  TempSrc: (P) Oral                 Darriana Deboy,E. [place] Clowers

## 2017-06-15 NOTE — H&P (Signed)
[name] HPI: Since May this year she noticed a change in her bowle movements. She thought that it was secondary to stress as he husband suffered with 3 CVAs, but as the symptoms persisted she did not feel that it was as a result of stress. There is bloating, gas, and a sense of urgency. Her stools are loose and she averages 2-3 BM per day. The bowel movements do not wake her up from sleep. The patient denies any issues with hematochezia or melena. In the past she was very regular with her bowel movements. There is no report of any weight loss, but she did have some changes with her medications. Most of the medications that were changed were secondary to her blood pressure. She is s/p left lung resection and RML resection for her lung cancer. The patient requires home oxygen. Her last colonoscopy was 10/20/2005.   Past Medical History:  Diagnosis Date  . ADD (attention deficit disorder)   . Allergic rhinitis   . Anemia   . Anxiety   . Arrhythmia   . Arthritis    "hands, neck, right shoulder" (07/28/2015)  . Aseptic necrosis ([place])   . Asthma dxc'd 05/2015  . Basal cell cancer   . Bleeding stomach ulcer ~ 06/2005  . COPD (chronic obstructive pulmonary disease) ([place])   . Depression   . Diverticula of colon   . Dyspnea   . Esophagitis   . Gastric ulcer with hemorrhage   . GERD (gastroesophageal reflux disease)   . Heart murmur   . History of blood transfusion 06/2005   "related to pneumonia"  . History of kidney stones   . Hyperlipidemia   . Hypertension   . Hypothyroidism   . Kidney stones   . Lung cancer ([place])   . Mitral valve prolapse    "no ORs" (07/28/2015)  . Osteopenia   . Pneumonia 1970's; 06/2005  . Sleep apnea dx'd 06/2015   on oxygen at nite at 2L/[place]   . Squamous cell skin cancer     Past Surgical History:  Procedure Laterality Date  . ABDOMINAL HYSTERECTOMY  1988  . HAMMER TOE SURGERY Right ~ 2010  . SALPINGOOPHORECTOMY Left 1988  . SKIN CANCER  EXCISION     "I've had severl cut off RLE; left foreqrm, nose under nose"  . THORACOTOMY Left 06/2005  . THORACOTOMY/LOBECTOMY  09/2009; 11/2009   left; right    Family History  Problem Relation Age of Onset  . Graves' disease Daughter   . Breast cancer Maternal Aunt   . Heart disease Maternal Aunt   . Hypertension Mother   . Arthritis Maternal Grandmother     Social History:  reports that she has quit smoking. Her smoking use included Cigarettes. She smoked 0.00 packs per day for 43.00 years. She has never used smokeless tobacco. She reports that she drinks about 4.2 oz of alcohol per week . She reports that she does not use drugs.  Allergies:  Allergies  Allergen Reactions  . Doxycycline Nausea Only  . Erythromycin Nausea Only  . Sulfonamide Derivatives Hives    Medications: Scheduled: Continuous:  No results found for this or any previous visit (from the past 24 hour(s)).   No results found.  ROS:  As stated above in the HPI otherwise negative.  There were no vitals taken for this visit.    PE: Gen: NAD, Alert and Oriented HEENT:  [place]/AT, EOMI Neck: Supple, no LAD Lungs: CTA Bilaterally CV: RRR without M/G/R  ABM: Soft, NTND, +BS Ext: No C/C/E  Assessment/Plan: 1) Diarrhea.   I will perform a colonoscopy for fruther evaluation of her diarrhea. It may be stress related, but Microscopic colitis is a consideration. Biopsies will be performed on the day of the procedure. Because of her lung status, I will have her undergo the procedure at the hospital with anesthesia. I have discussed the risks of bleeding, infection, perforation, medication reactions, a 10% miss rate for a small colon cancer or polyp, and the risk of death. All questions were answered and the patient acknowledges these risks and wishes to proceed.   Eivan Gallina D 06/15/2017, 12:22 PM

## 2017-06-15 NOTE — Discharge Instructions (Signed)
YOU HAD AN ENDOSCOPIC PROCEDURE TODAY: Refer to the procedure report and other information in the discharge instructions given to you for any specific questions about what was found during the examination. If this information does not answer your questions, please call [facility] at [phone] to clarify.   YOU SHOULD EXPECT: Some feelings of bloating in the abdomen. Passage of more gas than usual. Walking can help get rid of the air that was put into your GI tract during the procedure and reduce the bloating. If you had a lower endoscopy (such as a colonoscopy or flexible sigmoidoscopy) you may notice spotting of blood in your stool or on the toilet paper. Some abdominal soreness may be present for a day or two, also.  DIET: Your first meal following the procedure should be a light meal and then it is ok to progress to your normal diet. A half-sandwich or bowl of soup is an example of a good first meal. Heavy or fried foods are harder to digest and may make you feel nauseous or bloated. Drink plenty of fluids but you should avoid alcoholic beverages for 24 hours. If you had an esophageal dilation, please see attached information for diet.   ACTIVITY: Your care partner should take you home directly after the procedure. You should plan to take it easy, moving slowly for the rest of the day. You can resume normal activity the day after the procedure however YOU SHOULD NOT DRIVE, use power tools, machinery or perform tasks that involve climbing or major physical exertion for 24 hours (because of the sedation medicines used during the test).   SYMPTOMS TO REPORT IMMEDIATELY: A gastroenterologist can be reached at any hour. Please call [phone]  for any of the following symptoms:  Following lower endoscopy (colonoscopy, flexible sigmoidoscopy) Excessive amounts of blood in the stool  Significant tenderness, worsening of abdominal pains  Swelling of the abdomen that is new, acute  Fever of  100 or higher  Following upper endoscopy (EGD, EUS, ERCP, esophageal dilation) Vomiting of blood or coffee ground material  New, significant abdominal pain  New, significant chest pain or pain under the shoulder blades  Painful or persistently difficult swallowing  New shortness of breath  Black, tarry-looking or red, bloody stools  FOLLOW UP:  If any biopsies were taken you will be contacted by phone or by letter within the next 1-3 weeks. Call [phone]  if you have not heard about the biopsies in 3 weeks.  Please also call with any specific questions about appointments or follow up tests. YOU HAD AN ENDOSCOPIC PROCEDURE TODAY: Refer to the procedure report and other information in the discharge instructions given to you for any specific questions about what was found during the examination. If this information does not answer your questions, please call [facility] at [phone] to clarify.   YOU SHOULD EXPECT: Some feelings of bloating in the abdomen. Passage of more gas than usual. Walking can help get rid of the air that was put into your GI tract during the procedure and reduce the bloating. If you had a lower endoscopy (such as a colonoscopy or flexible sigmoidoscopy) you may notice spotting of blood in your stool or on the toilet paper. Some abdominal soreness may be present for a day or two, also.  DIET: Your first meal following the procedure should be a light meal and then it is ok to progress to your normal diet. A half-sandwich or bowl of soup is an example  of a good first meal. Heavy or fried foods are harder to digest and may make you feel nauseous or bloated. Drink plenty of fluids but you should avoid alcoholic beverages for 24 hours. If you had an esophageal dilation, please see attached information for diet.   ACTIVITY: Your care partner should take you home directly after the procedure. You should plan to take it easy, moving slowly for the rest of the day. You  can resume normal activity the day after the procedure however YOU SHOULD NOT DRIVE, use power tools, machinery or perform tasks that involve climbing or major physical exertion for 24 hours (because of the sedation medicines used during the test).   SYMPTOMS TO REPORT IMMEDIATELY: A gastroenterologist can be reached at any hour. Please call [phone]  for any of the following symptoms:  Following lower endoscopy (colonoscopy, flexible sigmoidoscopy) Excessive amounts of blood in the stool  Significant tenderness, worsening of abdominal pains  Swelling of the abdomen that is new, acute  Fever of 100 or higher  Following upper endoscopy (EGD, EUS, ERCP, esophageal dilation) Vomiting of blood or coffee ground material  New, significant abdominal pain  New, significant chest pain or pain under the shoulder blades  Painful or persistently difficult swallowing  New shortness of breath  Black, tarry-looking or red, bloody stools  FOLLOW UP:  If any biopsies were taken you will be contacted by phone or by letter within the next 1-3 weeks. Call [phone]  if you have not heard about the biopsies in 3 weeks.  Please also call with any specific questions about appointments or follow up tests.

## 2017-06-15 NOTE — Transfer of Care (Signed)
Immediate Anesthesia Transfer of Care Note  Patient: [name]  Procedure(s) Performed: COLONOSCOPY WITH PROPOFOL (N/A )  Patient Location: PACU  Anesthesia Type:MAC  Level of Consciousness: sedated  Airway & Oxygen Therapy: Patient Spontanous Breathing and Patient connected to nasal cannula oxygen  Post-op Assessment: Report given to RN and Post -op Vital signs reviewed and stable  Post vital signs: Reviewed and stable  Last Vitals:  Vitals:   06/15/17 1209  BP: (!) (P) 154/82  Pulse: (P) 82  Resp: (P) 19  SpO2: 99%    Last Pain: There were no vitals filed for this visit.       Complications: No apparent anesthesia complications

## 2017-06-15 NOTE — Anesthesia Preprocedure Evaluation (Addendum)
Anesthesia Evaluation  Patient identified by MRN, date of birth, ID band Patient awake    Reviewed: Allergy & Precautions, NPO status , Patient's Chart, lab work & pertinent test results  History of Anesthesia Complications Negative for: history of anesthetic complications  Airway Mallampati: II  TM Distance: >3 FB Neck ROM: Full    Dental  (+) Caps, Dental Advisory Given   Pulmonary sleep apnea (nocturnal O2) and Oxygen sleep apnea , COPD,  oxygen dependent, former smoker,  H/o lung cancer   breath sounds clear to auscultation       Cardiovascular hypertension, Pt. on medications (-) angina Rhythm:Regular Rate:Normal  '10 ECHO: EF 60-65%, mild MR   Neuro/Psych PSYCHIATRIC DISORDERS (ADD) Anxiety Depression negative neurological ROS     GI/Hepatic GERD  Medicated and Controlled,(+)     substance abuse  alcohol use,   Endo/Other  Hypothyroidism   Renal/GU negative Renal ROS     Musculoskeletal   Abdominal   Peds  Hematology negative hematology ROS (+)   Anesthesia Other Findings   Reproductive/Obstetrics                            Anesthesia Physical Anesthesia Plan  ASA: III  Anesthesia Plan: MAC   Post-op Pain Management:    Induction: Intravenous  PONV Risk Score and Plan: 2 and Treatment may vary due to age or medical condition  Airway Management Planned: Natural Airway and Simple Face Mask  Additional Equipment:   Intra-op Plan:   Post-operative Plan:   Informed Consent: I have reviewed the patients History and Physical, chart, labs and discussed the procedure including the risks, benefits and alternatives for the proposed anesthesia with the patient or authorized representative who has indicated his/her understanding and acceptance.   Dental advisory given  Plan Discussed with: CRNA and Surgeon  Anesthesia Plan Comments: (Plan routine monitors, MAC)         Anesthesia Quick Evaluation

## 2017-06-15 NOTE — Telephone Encounter (Signed)
Is it ok to give updated placard

## 2017-06-18 ENCOUNTER — Encounter (HOSPITAL_COMMUNITY): Payer: Self-pay | Admitting: Gastroenterology

## 2017-06-29 ENCOUNTER — Encounter: Payer: Self-pay | Admitting: Internal Medicine

## 2017-06-29 ENCOUNTER — Other Ambulatory Visit (INDEPENDENT_AMBULATORY_CARE_PROVIDER_SITE_OTHER): Payer: Medicare Other

## 2017-06-29 ENCOUNTER — Ambulatory Visit (INDEPENDENT_AMBULATORY_CARE_PROVIDER_SITE_OTHER): Payer: Medicare Other | Admitting: Internal Medicine

## 2017-06-29 VITALS — BP 112/70 | HR 87 | Ht 67.0 in | Wt 166.0 lb

## 2017-06-29 DIAGNOSIS — R0602 Shortness of breath: Secondary | ICD-10-CM | POA: Insufficient documentation

## 2017-06-29 DIAGNOSIS — J9611 Chronic respiratory failure with hypoxia: Secondary | ICD-10-CM | POA: Diagnosis not present

## 2017-06-29 DIAGNOSIS — R0600 Dyspnea, unspecified: Secondary | ICD-10-CM

## 2017-06-29 DIAGNOSIS — R0609 Other forms of dyspnea: Secondary | ICD-10-CM

## 2017-06-29 DIAGNOSIS — J449 Chronic obstructive pulmonary disease, unspecified: Secondary | ICD-10-CM

## 2017-06-29 LAB — BASIC METABOLIC PANEL
BUN: 19 mg/dL (ref 6–23)
CO2: 27 mEq/L (ref 19–32)
Calcium: 9.6 mg/dL (ref 8.4–10.5)
Chloride: 101 mEq/L (ref 96–112)
Creatinine, Ser: 0.72 mg/dL (ref 0.40–1.20)
GFR: 83.91 mL/min (ref >60.00)
Glucose, Bld: 96 mg/dL (ref 70–99)
Potassium: 3.9 mEq/L (ref 3.5–5.1)
Sodium: 139 mEq/L (ref 135–145)

## 2017-06-29 LAB — CBC WITH DIFFERENTIAL/PLATELET
Basophils Absolute: 0.1 10*3/uL (ref 0.0–0.1)
Basophils Relative: 0.9 % (ref 0.0–3.0)
Eosinophils Absolute: 0.2 10*3/uL (ref 0.0–0.7)
Eosinophils Relative: 2.3 % (ref 0.0–5.0)
HCT: 42.1 % (ref 36.0–46.0)
Hemoglobin: 14.4 g/dL (ref 12.0–15.0)
Lymphocytes Relative: 25.2 % (ref 12.0–46.0)
Lymphs Abs: 1.7 10*3/uL (ref 0.7–4.0)
MCHC: 34.1 g/dL (ref 30.0–36.0)
MCV: 100.2 fl — ABNORMAL HIGH (ref 78.0–100.0)
Monocytes Absolute: 0.8 10*3/uL (ref 0.1–1.0)
Monocytes Relative: 11.3 % (ref 3.0–12.0)
Neutro Abs: 4.1 10*3/uL (ref 1.4–7.7)
Neutrophils Relative %: 60.3 % (ref 43.0–77.0)
Platelets: 279 10*3/uL (ref 150.0–400.0)
RBC: 4.2 Mil/uL (ref 3.87–5.11)
RDW: 13.3 % (ref 11.5–15.5)
WBC: 6.8 10*3/uL (ref 4.0–10.5)

## 2017-06-29 LAB — TSH: TSH: 1.96 u[IU]/mL (ref 0.35–4.50)

## 2017-06-29 LAB — BRAIN NATRIURETIC PEPTIDE: Pro B Natriuretic peptide (BNP): 28 pg/mL (ref 0.0–100.0)

## 2017-06-29 MED ORDER — GLYCOPYRROLATE-FORMOTEROL 9-4.8 MCG/ACT IN AERO: 2.0000 | INHALATION_SPRAY | Freq: Two times a day (BID) | RESPIRATORY_TRACT | 11 refills | Status: DC

## 2017-06-29 MED ORDER — GLYCOPYRROLATE-FORMOTEROL 9-4.8 MCG/ACT IN AERO: 2.0000 | INHALATION_SPRAY | Freq: Every day | RESPIRATORY_TRACT | 0 refills | Status: DC

## 2017-06-29 NOTE — Progress Notes (Signed)
Subjective:     Patient ID: [name], female   DOB: 1940-09-28   MRN: [id number]  HPI   66 yowf outpt director at [employer] quit smoking 2006 with L empyema and never recovered activity tol eval by [facility] and [facility] with polycthemia assoc with  noct desat min copd >  2lpm x hs only x 2017 per [facility] but requested transfer of care to [place] so self referred to pulmonary clinic 06/29/2017       06/29/2017 1st [facility] office visit/ [name]   Chief Complaint  Patient presents with  . Pulmonary Consult    Self referral. She has seen Dr. Lamonte Sakai in the past- last in 2012. She has been seeing Dr. Corrin Parker at [facility] and is here today to re-est care. She c/o DOE with cleaning her house or taking out the trash. She is on O2 with sleep at 2lpm. She uses proiar 1 x per wk on average and has a neb with albuterol that she rarely uses.   doe x [facility] = can't walk a nl pace on a flat grade s sob but does fine slow and flat eg shopping at slow pace / can't do ramps Doe Never changed since empyema despite having RT and chemo for BAC dx 09/14/09 at [facility] with [facility] 11/2009 and persistent bilateral MPN's under surveillance at [facility] but no recent bx's or further chemo.  Uses  advair 250 twice daily ? Benefit > saba doesn't really help any of her symptoms/ no signifcant chronic or assoc  Coughing.   No obvious day to day or daytime variability or assoc excess/ purulent sputum or mucus plugs or hemoptysis or cp or chest tightness, subjective wheeze or overt sinus or hb symptoms. No unusual exp hx or h/o childhood pna/ asthma or knowledge of premature birth.  Sleeping ok flat without nocturnal  or early am exacerbation  of respiratory  c/o's or need for noct saba. Also denies any obvious fluctuation of symptoms with weather or environmental changes or other aggravating or alleviating factors except as outlined above   Current Allergies, Complete Past Medical History, Past Surgical History, Family  History, and Social History were reviewed in [employer] record.  ROS  The following are not active complaints unless bolded Hoarseness, sore throat, dysphagia, dental problems, itching, sneezing,  nasal congestion or discharge of excess mucus or purulent secretions, ear ache,   fever, chills, sweats, unintended wt loss or wt gain, classically pleuritic or exertional cp,  orthopnea pnd or leg swelling, presyncope, palpitations, abdominal pain, anorexia, nausea, vomiting, diarrhea  or change in bowel habits or change in bladder habits, change in stools or change in urine, dysuria, hematuria,  rash, arthralgias, visual complaints, headache, numbness, weakness or ataxia or problems with walking or coordination,  change in mood/affect or memory.        Current Meds  Medication Sig  . acetaminophen (TYLENOL) 325 MG tablet Take by mouth.  Marland Kitchen albuterol (PROAIR HFA) 108 (90 Base) MCG/ACT inhaler Inhale 1 puff into the lungs every 6 (six) hours as needed for wheezing or shortness of breath.  Marland Kitchen albuterol (PROVENTIL) (2.5 MG/3ML) 0.083% nebulizer solution USE 1 VIAL (3 ML OR 2.5 MG TOTAL) VIA NEBULIZER EVERY 4 HOURS AS NEEDED FOR WHEEZING OR SHORTNESS OF BREATH  . ALPRAZolam (XANAX) 0.5 MG tablet TAKE 1 TABLET BY MOUTH EVERY DAY AT BEDTIME AS NEEDED ANXIETY OR INSOMNIA  . amphetamine-dextroamphetamine (ADDERALL XR) 20 MG 24 hr capsule Take 1 capsule (  20 mg total) by mouth every morning.  Marland Kitchen aspirin 81 MG tablet Take 81 mg by mouth daily.   Marland Kitchen b complex-C-folic acid 1 MG capsule Take 1 capsule by mouth daily.    Marland Kitchen buPROPion (WELLBUTRIN XL) 150 MG 24 hr tablet TAKE 1 TABLET DAILY  . Calcium Carbonate-Vit D-Min (CALCIUM 1200 PO) Take by mouth.  . celecoxib (CELEBREX) 200 MG capsule TAKE 1 CAPSULE DAILY  . Cyanocobalamin 1000 MCG CAPS Take by mouth.  . doxazosin (CARDURA) 4 MG tablet Take 1 tablet (4 mg total) by mouth daily.  . DULoxetine (CYMBALTA) 60 MG capsule TAKE 1 CAPSULE DAILY  .  fluticasone (FLONASE) 50 MCG/ACT nasal spray Place 2 sprays into both nostrils daily.  . folic acid (FOLVITE) 1 MG tablet Take by mouth.  . levothyroxine (SYNTHROID, LEVOTHROID) 100 MCG tablet TAKE 1 TABLET EVERY MORNING  . losartan-hydrochlorothiazide (HYZAAR) 100-25 MG tablet Take 1 tablet by mouth daily.  Marland Kitchen omeprazole (PRILOSEC) 20 MG capsule TAKE 1 CAPSULE DAILY  . pravastatin (PRAVACHOL) 40 MG tablet Take 1 tablet (40 mg total) by mouth daily. Stop simvastatin  . traZODone (DESYREL) 50 MG tablet TAKE 1 TABLET AT BEDTIME AS NEEDED FOR SLEEP  . VITAMIN D, CHOLECALCIFEROL, PO Take 1 tablet by mouth daily.  .   Fluticasone-Salmeterol (ADVAIR DISKUS) 250-50 MCG/DOSE AEPB Inhale into the lungs.            Review of Systems     Objective:   Physical Exam     amb anxious wf nad   Wt Readings from Last 3 Encounters:  06/29/17 166 lb (75.3 kg)  05/28/17 164 lb (74.4 kg)  05/03/17 166 lb (75.3 kg)    Vital signs reviewed  - Note on arrival 02 sats  100% on RA      HEENT: nl dentition, turbinates bilaterally, and oropharynx. Nl external ear canals without cough reflex   NECK :  without JVD/Nodes/TM/ nl carotid upstrokes bilaterally   LUNGS: no acc muscle use,  Nl contour chest which is clear to A and P bilaterally without cough on insp or exp maneuvers   CV:  RRR  no s3 or murmur or increase in P2, and no edema   ABD:  soft and nontender with nl inspiratory excursion in the supine position. No bruits or organomegaly appreciated, bowel sounds nl  MS:  Nl gait/ ext warm without deformities, calf tenderness, cyanosis or clubbing No obvious joint restrictions   SKIN: warm and dry without lesions    NEURO:  alert, approp, nl sensorium with  no motor or cerebellar deficits apparent.       I personally reviewed images and agree with radiology impression as follows:   Chest CT  W/o contrast  05/08/17 1. Innumerable bilateral sub-solid nodules are redemonstrated, many  of which are pure ground glass, while others are part solid and part ground glass. These are concerning for multi-focal adenocarcinoma. These remain stable from prior CT performed 06/21/2016.  2. Consolidation of the left upper lobe, adjacent to a wedge resection site is stable, and is most likely secondary to fibrosis from prior SBRT.   Labs ordered/ reviewed:      Chemistry      Component Value Date/Time   NA 139 06/29/2017 1004   K 3.9 06/29/2017 1004   CL 101 06/29/2017 1004   CO2 27 06/29/2017 1004   BUN 19 06/29/2017 1004   CREATININE 0.72 06/29/2017 1004   CREATININE 0.88 04/06/2017 1246  Component Value Date/Time   CALCIUM 9.6 06/29/2017 1004   ALKPHOS 67 11/03/2016 1136   AST 18 11/03/2016 1136   ALT 14 11/03/2016 1136   BILITOT 0.5 11/03/2016 1136        Lab Results  Component Value Date   WBC 6.8 06/29/2017   HGB 14.4 06/29/2017   HCT 42.1 06/29/2017   MCV 100.2 (H) 06/29/2017   PLT 279.0 06/29/2017       Lab Results  Component Value Date   TSH 1.96 06/29/2017     Lab Results  Component Value Date   PROBNP 28.0 06/29/2017         Assessment:

## 2017-06-29 NOTE — Patient Instructions (Addendum)
Plan A = Automatic = bevespi Take 2 puffs first thing in am and then another 2 puffs about 12 hours later.    Work on inhaler technique:  relax and gently blow all the way out then take a nice smooth deep breath back in, triggering the inhaler at same time you start breathing in.  Hold for up to 5 seconds if you can.   Rinse and gargle with water when done  Stay as active as you can to see if any better breathing as result of using this medication and if not then just stop it and try just using your backups but if getting worse then restart advair at prev dose      Plan B = Backup Only use your albuterol as a rescue medication to be used if you can't catch your breath by resting or doing a relaxed purse lip breathing pattern.  - The less you use it, the better it will work when you need it. - Ok to use the inhaler up to 2 puffs  every 4 hours if you must but call for appointment if use goes up over your usual need - Don't leave home without it !!  (think of it like the spare tire for your car)   Plan C = Crisis - only use your albuterol nebulizer if you first try Plan B and it fails to help > ok to use the nebulizer up to every 4 hours but if start needing it regularly call for immediate appointment   Please remember to go to the lab department downstairs in the basement  for your tests - we will call you with the results when they are available.     Please schedule a follow up office visit in 6 weeks, call sooner if needed

## 2017-07-01 DIAGNOSIS — J9611 Chronic respiratory failure with hypoxia: Secondary | ICD-10-CM | POA: Insufficient documentation

## 2017-07-01 NOTE — Assessment & Plan Note (Signed)
06/29/2017  Walked RA x 3 laps @ 185 ft each stopped due to  End of study, fast pace, no desat, some sob at end   Symptoms are markedly disproportionate to objective findings and not clear this is actually much of a  lung problem but pt does appear to have difficult to sort out respiratory symptoms of unknown origin for which  DDX  = almost all start with A and  include Adherence, Ace Inhibitors, Acid Reflux, Active Sinus Disease, Alpha 1 Antitripsin deficiency, Anxiety masquerading as Airways dz,  ABPA,  Allergy(esp in young), Aspiration (esp in elderly), Adverse effects of meds,  Active smokers, A bunch of PE's (a small clot burden can't cause this syndrome unless there is already severe underlying pulm or vascular dz with poor reserve) plus two Bs  = Bronchiectasis and Beta blocker use..and one C= CHF     Adherence is always the initial "prime suspect" and is a multilayered concern that requires a "trust but verify" approach in every patient - starting with knowing how to use medications, especially inhalers, correctly, keeping up with refills and understanding the fundamental difference between maintenance and prns vs those medications only taken for a very short course and then stopped and not refilled.  - see hfa teaching   ? Acid (or non-acid) GERD > always difficult to exclude as up to 75% of pts in some series report no assoc GI/ Heartburn symptoms> rec max (24h)  acid suppression and diet restrictions/ reviewed and instructions given in writing.   ? Allergy / asthma > doubt, send profile to be complete, try off ICS  ? ACEi effects > most recent notes from [facility] indicate she uses ACEi > need to confirm she stopped  ? Adverse effects of dpi > try off advair  ? A bunch of pe's > would not explain what she describes as fixed, reproducible doe x years   ? chf > excluded with bnp so low    see avs for instructions unique to this ov

## 2017-07-01 NOTE — Assessment & Plan Note (Signed)
Quit smoking 2006 PFT's  08/07/11  FEV1 2.14 (89 % ) ratio 69  with DLCO  73 % corrects to 108 % for alv volume  - Spirometry 06/29/2017  FEV1 1.24 (52%)  Ratio 64 on advair - 06/29/2017  After extensive coaching HFA effectiveness =    75% > try bevespi      Informed the patient there was  no medication on the market that has proven to alter the curve/ its downward trajectory  or the likelihood of progression of their disease(unlike other chronic medical conditions such as atheroclerosis where we do think we can change the natural hx with risk reducing meds) .   Rx is focused instead on improving symptoms and reducing risk of exacerbations, which in her case are not frequent and therefore Pt is Group B in terms of symptom/risk and laba/lama therefore appropriate rx at this point and bevespi worth trying (or insurance alternative for lama/laba).  Total time devoted to counseling  > 50 % of initial 60 min office visit:  review case (including extensive notes in care everywhere)  with pt/ discussion of options/alternatives/ personally creating written customized instructions  in presence of pt  then going over those specific  Instructions directly with the pt including how to use all of the meds but in particular covering each new medication in detail and the difference between the maintenance= "automatic" meds and the prns using an action plan format for the latter (If this problem/symptom => do that organization reading Left to right).  Please see AVS from this visit for a full list of these instructions which I personally wrote for this pt and  are unique to this visit.

## 2017-07-01 NOTE — Assessment & Plan Note (Signed)
See care everywhere pulmonary notes from 2016/ [facility] > rx 2lpm hs

## 2017-07-02 LAB — RESPIRATORY ALLERGY PROFILE REGION II ~~LOC~~

## 2017-07-02 LAB — INTERPRETATION:

## 2017-07-03 NOTE — Progress Notes (Signed)
ATC, NA and no option to leave msg 

## 2017-07-04 ENCOUNTER — Ambulatory Visit: Payer: Medicare Other

## 2017-07-06 NOTE — Progress Notes (Signed)
ATC, NA and no option to leave msg 

## 2017-07-12 NOTE — Progress Notes (Signed)
LMTCB

## 2017-07-16 ENCOUNTER — Ambulatory Visit
Admission: RE | Admit: 2017-07-16 | Discharge: 2017-07-16 | Disposition: A | Discharge status: Home / Self Care | Payer: Medicare Other | Source: Ambulatory Visit | Attending: Family Medicine | Admitting: Family Medicine

## 2017-07-16 DIAGNOSIS — Z1231 Encounter for screening mammogram for malignant neoplasm of breast: Secondary | ICD-10-CM

## 2017-07-18 DIAGNOSIS — H04123 Dry eye syndrome of bilateral lacrimal glands: Secondary | ICD-10-CM | POA: Diagnosis not present

## 2017-08-10 ENCOUNTER — Ambulatory Visit (INDEPENDENT_AMBULATORY_CARE_PROVIDER_SITE_OTHER): Payer: Medicare Other | Admitting: Internal Medicine

## 2017-08-10 ENCOUNTER — Encounter: Payer: Self-pay | Admitting: Internal Medicine

## 2017-08-10 VITALS — BP 124/76 | HR 83 | Ht 67.0 in | Wt 169.6 lb

## 2017-08-10 DIAGNOSIS — J449 Chronic obstructive pulmonary disease, unspecified: Secondary | ICD-10-CM

## 2017-08-10 DIAGNOSIS — J9611 Chronic respiratory failure with hypoxia: Secondary | ICD-10-CM | POA: Diagnosis not present

## 2017-08-10 MED ORDER — GLYCOPYRROLATE-FORMOTEROL 9-4.8 MCG/ACT IN AERO: 2.0000 | INHALATION_SPRAY | Freq: Two times a day (BID) | RESPIRATORY_TRACT | 3 refills | Status: DC

## 2017-08-10 MED ORDER — GLYCOPYRROLATE-FORMOTEROL 9-4.8 MCG/ACT IN AERO: 2.0000 | INHALATION_SPRAY | Freq: Two times a day (BID) | RESPIRATORY_TRACT | 0 refills | Status: DC

## 2017-08-10 NOTE — Patient Instructions (Addendum)
Continue the Bevespi Take 2 puffs first thing in am and then another 2 puffs about 12 hours later.    Please schedule a follow up visit in 6  months but call sooner if needed

## 2017-08-10 NOTE — Progress Notes (Signed)
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 Pulmonary Consult    Self referral. She has seen Dr. Lamonte Sakai in the past- last in 2012. She has been seeing Dr. Corrin Parker at [facility] and is here today to re-est care. She c/o DOE with cleaning her house or taking out the trash. She is on O2 with sleep at 2lpm. She uses proiar 1 x per wk on average and has a neb with albuterol that she rarely uses.   doe x [facility] = can't walk a nl pace on a flat grade s sob but does fine slow and flat eg shopping at slow pace / can't do ramps Doe Never changed since empyema despite having RT and chemo for BAC dx 09/14/09 at [facility] with [facility] 11/2009 and persistent bilateral MPN's under surveillance at [facility] but no recent bx's or further chemo.  Uses  advair 250 twice daily ? Benefit > saba doesn't really help any of her symptoms/ no signifcant chronic or assoc  Coughing. rec Plan A = Automatic = bevespi Take 2 puffs first thing in am and then another 2 puffs about 12 hours later.  Work on inhaler technique: Plan B = Backup Only use your albuterol as a rescue medication  Plan C = Crisis - only use your albuterol nebulizer if you first try Plan B and it fails to help > ok to use the nebulizer up to every 4 hours but if start needing it regularly call for immediate appointment    08/10/2017  f/u ov/Wert re: GOLD II COPD  Chief Complaint  Patient presents with  . Follow-up    Breathing is doing well.  She uses  her albuterol inhaler and neb rarely.    doe improved to [facility] = can walk nl pace, flat grade, can't hurry or go uphills or steps s sob   No noct symtoms    No obvious day to day or daytime variability or assoc excess/ purulent sputum or mucus plugs or hemoptysis or cp or chest tightness, subjective wheeze or overt sinus or hb symptoms. No unusual exposure hx or h/o childhood pna/ asthma or knowledge of premature birth.  Sleeping ok flat without nocturnal  or early am exacerbation  of respiratory  c/o's or need for noct saba. Also denies any obvious fluctuation of symptoms with weather or environmental changes or other aggravating or alleviating factors except as outlined above   Current Allergies, Complete Past Medical History, Past Surgical History, Family History, and Social History were reviewed in [employer] record.  ROS  The following are not active complaints unless bolded Hoarseness, sore throat, dysphagia, dental problems, itching, sneezing,  nasal congestion or discharge of excess mucus or purulent secretions, ear ache,   fever, chills, sweats, unintended wt loss or wt gain, classically pleuritic or exertional cp,  orthopnea pnd or leg swelling, presyncope, palpitations, abdominal pain, anorexia,  nausea, vomiting, diarrhea  or change in bowel habits or change in bladder habits, change in stools or change in urine, dysuria, hematuria,  rash, arthralgias, visual complaints, headache, numbness, weakness or ataxia or problems with walking or coordination,  change in mood/affect= more anxious  or memory.        Current Meds  Medication Sig  . acetaminophen (TYLENOL) 325 MG tablet Take by mouth.  Marland Kitchen albuterol (PROAIR HFA) 108 (90 Base) MCG/ACT inhaler Inhale 1 puff into the lungs every 6 (six) hours as needed for wheezing or shortness of breath.  Marland Kitchen albuterol (PROVENTIL) (2.5 MG/3ML) 0.083% nebulizer solution USE 1 VIAL (3 ML OR 2.5 MG TOTAL) VIA NEBULIZER EVERY 4 HOURS AS  NEEDED FOR WHEEZING OR SHORTNESS OF BREATH  . ALPRAZolam (XANAX) 0.5 MG tablet TAKE 1 TABLET BY MOUTH EVERY DAY AT BEDTIME AS NEEDED ANXIETY OR INSOMNIA  . amphetamine-dextroamphetamine (ADDERALL XR) 20 MG 24 hr capsule Take 1 capsule (20 mg total) by mouth every morning.  Marland Kitchen aspirin 81 MG tablet Take 81 mg by mouth daily.   Marland Kitchen b complex-C-folic acid 1 MG capsule Take 1 capsule by mouth daily.    Marland Kitchen buPROPion (WELLBUTRIN XL) 150 MG 24 hr tablet TAKE 1 TABLET DAILY  . Calcium Carbonate-Vit D-Min (CALCIUM 1200 PO) Take by mouth.  . celecoxib (CELEBREX) 200 MG capsule TAKE 1 CAPSULE DAILY  . Cyanocobalamin 1000 MCG CAPS Take by mouth.  . doxazosin (CARDURA) 4 MG tablet Take 1 tablet (4 mg total) by mouth daily.  . DULoxetine (CYMBALTA) 60 MG capsule TAKE 1 CAPSULE DAILY  . fluticasone (FLONASE) 50 MCG/ACT nasal spray Place 2 sprays into both nostrils daily.  . folic acid (FOLVITE) 1 MG tablet Take by mouth.  . Glycopyrrolate-Formoterol (BEVESPI AEROSPHERE) 9-4.8 MCG/ACT AERO Inhale 2 puffs into the lungs 2 (two) times daily.  Marland Kitchen levothyroxine (SYNTHROID, LEVOTHROID) 100 MCG tablet TAKE 1 TABLET EVERY MORNING  . losartan-hydrochlorothiazide (HYZAAR) 100-25 MG tablet Take 1 tablet by mouth daily.  Marland Kitchen omeprazole (PRILOSEC) 20 MG capsule TAKE 1 CAPSULE DAILY  . OXYGEN 2lpm with sleep  . pravastatin (PRAVACHOL) 40 MG tablet Take 1 tablet (40 mg total) by mouth daily. Stop simvastatin  . traZODone (DESYREL) 50 MG tablet TAKE 1 TABLET AT BEDTIME AS NEEDED FOR SLEEP  . VITAMIN D, CHOLECALCIFEROL, PO Take 1 tablet by mouth daily.  . [  Glycopyrrolate-Formoterol (BEVESPI AEROSPHERE) 9-4.8 MCG/ACT AERO Inhale 2 puffs into the lungs 2 (two) times daily.  .              Objective:   Physical Exam     Pleasant talkative amb wf / all smiles today       08/10/2017     170   06/29/17 166 lb (75.3 kg)  05/28/17 164 lb (74.4 kg)  05/03/17 166 lb (75.3 kg)      HEENT: nl dentition, turbinates  bilaterally, and oropharynx. Nl external ear canals without cough reflex   NECK :  without JVD/Nodes/TM/ nl carotid upstrokes bilaterally   LUNGS: no acc muscle use,  Nl contour chest which is clear to A and P bilaterally without cough on insp or exp maneuvers   CV:  RRR  no s3 or murmur or increase in P2, and no edema   ABD:  soft and nontender with nl inspiratory excursion in the supine position. No bruits or organomegaly appreciated, bowel sounds nl  MS:  Nl gait/ ext warm without deformities, calf tenderness, cyanosis or clubbing No  obvious joint restrictions   SKIN: warm and dry without lesions    NEURO:  alert, approp, nl sensorium with  no motor or cerebellar deficits apparent.           Labs Reviewed 08/10/2017      Chemistry      Component Value Date/Time   NA 139 06/29/2017 1004   K 3.9 06/29/2017 1004   CL 101 06/29/2017 1004   CO2 27 06/29/2017 1004   BUN 19 06/29/2017 1004   CREATININE 0.72 06/29/2017 1004   CREATININE 0.88 04/06/2017 1246      Component Value Date/Time   CALCIUM 9.6 06/29/2017 1004   ALKPHOS 67 11/03/2016 1136   AST 18 11/03/2016 1136   ALT 14 11/03/2016 1136   BILITOT 0.5 11/03/2016 1136        Lab Results  Component Value Date   WBC 6.8 06/29/2017   HGB 14.4 06/29/2017   HCT 42.1 06/29/2017   MCV 100.2 (H) 06/29/2017   PLT 279.0 06/29/2017       Lab Results  Component Value Date   TSH 1.96 06/29/2017     Lab Results  Component Value Date   PROBNP 28.0 06/29/2017         Assessment:

## 2017-08-12 ENCOUNTER — Encounter: Payer: Self-pay | Admitting: Internal Medicine

## 2017-08-12 NOTE — Assessment & Plan Note (Signed)
See care everywhere pulmonary notes from 2016/ Govert > rx 2lpm hs  No change rx needed / verify use with each ov

## 2017-08-12 NOTE — Assessment & Plan Note (Addendum)
Quit smoking 2006 PFT's  08/07/11  FEV1 2.14 (89 % ) ratio 69  with DLCO  73 % corrects to 108 % for alv volume  - Spirometry 06/29/2017  FEV1 1.24 (52%)  Ratio 64 on advair -  Allergy profile 06/29/17  >  Eos 0.2/  IgE  13 RAST neg  - 06/29/2017  try bevespi   > improved 08/10/2017   - 08/10/2017  After extensive coaching HFA effectiveness =    90%   Pt is Group B in terms of symptom/risk and laba/lama therefore appropriate rx at this point.   No change rx needed for now  I had an extended discussion with the patient reviewing all relevant studies completed to date and  lasting 15 to 20 minutes of a 25 minute visit on the following ongoing concerns:   Formulary restrictions will be an ongoing challenge for the forseable future and I would be happy to pick an alternative if the pt will first  provide me a list of them but pt  will need to return here for training for any new device that is required eg dpi vs hfa vs respimat.    In meantime we can always provide samples so the patient never runs out of any needed respiratory medications.   Each maintenance medication was reviewed in detail including most importantly the difference between maintenance and as needed and under what circumstances the prns are to be used.  Please see AVS for specific  Instructions which are unique to this visit and I personally typed out  which were reviewed in detail in writing with the patient and a copy provided.

## 2017-08-16 ENCOUNTER — Other Ambulatory Visit: Payer: Self-pay

## 2017-08-30 ENCOUNTER — Other Ambulatory Visit: Payer: Self-pay | Admitting: Family Medicine

## 2017-08-30 MED ORDER — AMPHETAMINE-DEXTROAMPHET ER 20 MG PO CP24: 20.0000 mg | ORAL_CAPSULE | ORAL | 0 refills | Status: DC

## 2017-08-30 NOTE — Telephone Encounter (Signed)
Requesting refill on adderall - per Dr. Dennard Schaumann ok x 3 months. Med sent to pharm

## 2017-09-29 ENCOUNTER — Other Ambulatory Visit: Payer: Self-pay | Admitting: Family Medicine

## 2017-09-29 DIAGNOSIS — E7800 Pure hypercholesterolemia, unspecified: Secondary | ICD-10-CM

## 2017-10-02 ENCOUNTER — Encounter: Payer: Self-pay | Admitting: Family Medicine

## 2017-10-02 ENCOUNTER — Ambulatory Visit (INDEPENDENT_AMBULATORY_CARE_PROVIDER_SITE_OTHER): Payer: Medicare Other | Admitting: Family Medicine

## 2017-10-02 VITALS — BP 130/76 | HR 96 | Temp 98.0°F | Resp 20 | Ht 67.5 in | Wt 171.0 lb

## 2017-10-02 DIAGNOSIS — J441 Chronic obstructive pulmonary disease with (acute) exacerbation: Secondary | ICD-10-CM | POA: Diagnosis not present

## 2017-10-02 MED ORDER — PREDNISONE 20 MG PO TABS: ORAL_TABLET | ORAL | 0 refills | Status: DC

## 2017-10-02 MED ORDER — AZITHROMYCIN 250 MG PO TABS: ORAL_TABLET | ORAL | 0 refills | Status: DC

## 2017-10-02 NOTE — Progress Notes (Signed)
Subjective:    Patient ID: [name], female    DOB: 15-Feb-1941, 77 y.o.   MRN: [id number]  HPI Patient developed a cold approximately 2-3 weeks ago. However symptoms have gradually worsened. She now reports audible wheezing, chest congestion, cough productive of purulent sputum, and increasing shortness of breath. Past medical history is significant for a lobe resection in the right lung due to lung cancer as well as a wedge resection in the left lung. She also has underlying COPD. On exam, her right side is clear to auscultation however she has audible wheezes in the upper and lower field on the left side with rhonchorous breath sounds Past Medical History:  Diagnosis Date  . ADD (attention deficit disorder)   . Allergic rhinitis   . Anemia   . Anxiety   . Arrhythmia   . Arthritis    "hands, neck, right shoulder" (07/28/2015)  . Aseptic necrosis ([place])   . Asthma dxc'd 05/2015  . Basal cell cancer   . Bleeding stomach ulcer ~ 06/2005  . COPD (chronic obstructive pulmonary disease) ([place])   . Depression   . Diverticula of colon   . Dyspnea   . Esophagitis   . Gastric ulcer with hemorrhage   . GERD (gastroesophageal reflux disease)   . Heart murmur   . History of blood transfusion 06/2005   "related to pneumonia"  . History of kidney stones   . Hyperlipidemia   . Hypertension   . Hypothyroidism   . Kidney stones   . Lung cancer ([place])   . Mitral valve prolapse    "no ORs" (07/28/2015)  . Osteopenia   . Pneumonia 1970's; 06/2005  . Sleep apnea dx'd 06/2015   on oxygen at nite at 2L/[place]   . Squamous cell skin cancer    Past Surgical History:  Procedure Laterality Date  . ABDOMINAL HYSTERECTOMY  1988  . COLONOSCOPY WITH PROPOFOL N/A 06/15/2017   Procedure: COLONOSCOPY WITH PROPOFOL;  Surgeon: Carol Ada, MD;  Location: [facility];  Service: Endoscopy;  Laterality: N/A;  . HAMMER TOE SURGERY Right ~ 2010  . SALPINGOOPHORECTOMY Left 1988  . SKIN CANCER EXCISION      "I've had severl cut off RLE; left foreqrm, nose under nose"  . THORACOTOMY Left 06/2005  . THORACOTOMY/LOBECTOMY  09/2009; 11/2009   left; right   Current Outpatient Medications on File Prior to Visit  Medication Sig Dispense Refill  . acetaminophen (TYLENOL) 325 MG tablet Take by mouth.    Marland Kitchen albuterol (PROAIR HFA) 108 (90 Base) MCG/ACT inhaler Inhale 1 puff into the lungs every 6 (six) hours as needed for wheezing or shortness of breath.    Marland Kitchen albuterol (PROVENTIL) (2.5 MG/3ML) 0.083% nebulizer solution USE 1 VIAL (3 ML OR 2.5 MG TOTAL) VIA NEBULIZER EVERY 4 HOURS AS NEEDED FOR WHEEZING OR SHORTNESS OF BREATH 450 mL 3  . ALPRAZolam (XANAX) 0.5 MG tablet TAKE 1 TABLET BY MOUTH EVERY DAY AT BEDTIME AS NEEDED ANXIETY OR INSOMNIA 90 tablet 0  . amphetamine-dextroamphetamine (ADDERALL XR) 20 MG 24 hr capsule Take 1 capsule (20 mg total) by mouth every morning. 30 capsule 0  . aspirin 81 MG tablet Take 81 mg by mouth daily.     Marland Kitchen b complex-C-folic acid 1 MG capsule Take 1 capsule by mouth daily.      Marland Kitchen buPROPion (WELLBUTRIN XL) 150 MG 24 hr tablet TAKE 1 TABLET DAILY 90 tablet 3  . Calcium Carbonate-Vit D-Min (CALCIUM 1200 PO) Take by mouth.    Marland Kitchen  celecoxib (CELEBREX) 200 MG capsule TAKE 1 CAPSULE DAILY 90 capsule 2  . Cyanocobalamin 1000 MCG CAPS Take by mouth.    . doxazosin (CARDURA) 4 MG tablet Take 1 tablet (4 mg total) by mouth daily. 90 tablet 1  . DULoxetine (CYMBALTA) 60 MG capsule TAKE 1 CAPSULE DAILY 90 capsule 3  . fluticasone (FLONASE) 50 MCG/ACT nasal spray Place 2 sprays into both nostrils daily. 48 g 3  . folic acid (FOLVITE) 1 MG tablet Take by mouth.    . Glycopyrrolate-Formoterol (BEVESPI AEROSPHERE) 9-4.8 MCG/ACT AERO Inhale 2 puffs into the lungs 2 (two) times daily. 1 Inhaler 0  . levothyroxine (SYNTHROID, LEVOTHROID) 100 MCG tablet TAKE 1 TABLET EVERY MORNING 90 tablet 2  . losartan-hydrochlorothiazide (HYZAAR) 100-25 MG tablet Take 1 tablet by mouth daily. 90 tablet 3  .  omeprazole (PRILOSEC) 20 MG capsule TAKE 1 CAPSULE DAILY 90 capsule 3  . OXYGEN 2lpm with sleep    . pravastatin (PRAVACHOL) 40 MG tablet Take 1 tablet (40 mg total) by mouth daily. Stop simvastatin 90 tablet 3  . pravastatin (PRAVACHOL) 40 MG tablet TAKE 1 TABLET DAILY (STOP SIMVASTATIN) 90 tablet 3  . traZODone (DESYREL) 50 MG tablet TAKE 1 TABLET AT BEDTIME AS NEEDED FOR SLEEP 60 tablet 1  . VITAMIN D, CHOLECALCIFEROL, PO Take 1 tablet by mouth daily.     No current facility-administered medications on file prior to visit.    Allergies  Allergen Reactions  . Doxycycline Nausea Only  . Erythromycin Nausea Only  . Sulfonamide Derivatives Hives   Social History   Socioeconomic History  . Marital status: Married    Spouse name: Not on file  . Number of children: 3  . Years of education: Not on file  . Highest education level: Not on file  Social Needs  . Financial resource strain: Not on file  . Food insecurity - worry: Not on file  . Food insecurity - inability: Not on file  . Transportation needs - medical: Not on file  . Transportation needs - non-medical: Not on file  Occupational History  . Occupation: retired Optician, dispensing: RETIRED  Tobacco Use  . Smoking status: Former Smoker    Packs/day: 0.75    Years: 30.00    Pack years: 22.50    Types: Cigarettes    Last attempt to quit: 09/11/2004    Years since quitting: 13.0  . Smokeless tobacco: Never Used  Substance and Sexual Activity  . Alcohol use: Yes    Alcohol/week: 4.2 oz    Types: 7 Shots of liquor per week    Comment: 1-2 drinks nightly   . Drug use: No  . Sexual activity: Yes  Other Topics Concern  . Not on file  Social History Narrative  . Not on file      Review of Systems  All other systems reviewed and are negative.      Objective:   Physical Exam  Constitutional: She appears well-developed and well-nourished. No distress.  HENT:  Right Ear: External ear normal.  Left Ear: External ear  normal.  Nose: Nose normal.  Mouth/Throat: Oropharynx is clear and moist.  Eyes: Conjunctivae are normal.  Neck: Neck supple.  Cardiovascular: Normal rate, regular rhythm and normal heart sounds.  Pulmonary/Chest: No respiratory distress. She has wheezes. She has no rales.  Abdominal: Soft. Bowel sounds are normal.  Lymphadenopathy:    She has no cervical adenopathy.  Skin: She is not diaphoretic.  Vitals reviewed.  Assessment & Plan:  COPD exacerbation ([place])  Begin a Z-Pak for bronchitis with COPD exacerbation given the fact symptoms are worsening now after 2 weeks. Start prednisone taper pack. Use albuterol 2 puffs inhaled every 4-6 hours as needed for wheezing. Patient requested increasing her Adderall. She is seen significant improvement on Adderall XR 20 mg a day however she continues to experience poor focus, she easily forgets items, she reports poor concentration. However I explained that at her age, I would be hesitant to increase any stimulant medication unless absolutely necessary. The patient understands this and agrees and will make no changes in her Adderall at the present time

## 2017-10-09 ENCOUNTER — Ambulatory Visit (INDEPENDENT_AMBULATORY_CARE_PROVIDER_SITE_OTHER): Payer: Medicare Other | Admitting: Family Medicine

## 2017-10-09 ENCOUNTER — Encounter: Payer: Self-pay | Admitting: Family Medicine

## 2017-10-09 ENCOUNTER — Telehealth: Payer: Self-pay | Admitting: Family Medicine

## 2017-10-09 VITALS — BP 138/80 | HR 100 | Temp 98.2°F | Resp 22 | Ht 67.5 in | Wt 168.0 lb

## 2017-10-09 DIAGNOSIS — J189 Pneumonia, unspecified organism: Secondary | ICD-10-CM | POA: Diagnosis not present

## 2017-10-09 MED ORDER — MOXIFLOXACIN HCL 400 MG PO TABS: 400.0000 mg | ORAL_TABLET | Freq: Every day | ORAL | 0 refills | Status: DC

## 2017-10-09 MED ORDER — LEVOFLOXACIN 500 MG PO TABS: 500.0000 mg | ORAL_TABLET | Freq: Every day | ORAL | 0 refills | Status: DC

## 2017-10-09 MED ORDER — NYSTATIN 100000 UNIT/ML MT SUSP: 5.0000 mL | Freq: Four times a day (QID) | OROMUCOSAL | 0 refills | Status: DC

## 2017-10-09 NOTE — Telephone Encounter (Signed)
Pharmacy called and stated that the levaquin is on back order could we call in something else. Per Dr. Dennard Schaumann change to Avelox 400mg  qd x 7days. New rx called to pharm.

## 2017-10-09 NOTE — Progress Notes (Signed)
Subjective:    Patient ID: [name], female    DOB: [birth year], 77 y.o.   MRN: [id number]  HPI 10/02/17 Patient developed a cold approximately 2-3 weeks ago. However symptoms have gradually worsened. She now reports audible wheezing, chest congestion, cough productive of purulent sputum, and increasing shortness of breath. Past medical history is significant for a lobe resection in the right lung due to lung cancer as well as a wedge resection in the left lung. She also has underlying COPD. On exam, her right side is clear to auscultation however she has audible wheezes in the upper and lower field on the left side with rhonchorous breath sounds.  At that time, my plan was:  Begin a Z-Pak for bronchitis with COPD exacerbation given the fact symptoms are worsening now after 2 weeks. Start prednisone taper pack. Use albuterol 2 puffs inhaled every 4-6 hours as needed for wheezing. Patient requested increasing her Adderall. She is seen significant improvement on Adderall XR 20 mg a day however she continues to experience poor focus, she easily forgets items, she reports poor concentration. However I explained that at her age, I would be hesitant to increase any stimulant medication unless absolutely necessary. The patient understands this and agrees and will make no changes in her Adderall at the present time  10/09/17 Patient states that her symptoms are no better.  In fact they are worsening.  She continues to have subjective fevers, shortness of breath, productive cough.  There is no wheezing today on exam however in the left lower lobe posteriorly there are prominent crackles and rales.  She reports increasing shortness of breath.  She reports fatigue and mild pleurisy Past Medical History:  Diagnosis Date  . ADD (attention deficit disorder)   . Allergic rhinitis   . Anemia   . Anxiety   . Arrhythmia   . Arthritis    "hands, neck, right shoulder" (07/28/2015)  . Aseptic necrosis ([place])    . Asthma dxc'd 05/2015  . Basal cell cancer   . Bleeding stomach ulcer ~ 06/2005  . COPD (chronic obstructive pulmonary disease) ([place])   . Depression   . Diverticula of colon   . Dyspnea   . Esophagitis   . Gastric ulcer with hemorrhage   . GERD (gastroesophageal reflux disease)   . Heart murmur   . History of blood transfusion 06/2005   "related to pneumonia"  . History of kidney stones   . Hyperlipidemia   . Hypertension   . Hypothyroidism   . Kidney stones   . Lung cancer ([place])   . Mitral valve prolapse    "no ORs" (07/28/2015)  . Osteopenia   . Pneumonia 1970's; 06/2005  . Sleep apnea dx'd 06/2015   on oxygen at nite at 2L/[place]   . Squamous cell skin cancer    Past Surgical History:  Procedure Laterality Date  . ABDOMINAL HYSTERECTOMY  1988  . COLONOSCOPY WITH PROPOFOL N/A 06/15/2017   Procedure: COLONOSCOPY WITH PROPOFOL;  Surgeon: Carol Ada, MD;  Location: [facility];  Service: Endoscopy;  Laterality: N/A;  . HAMMER TOE SURGERY Right ~ 2010  . SALPINGOOPHORECTOMY Left 1988  . SKIN CANCER EXCISION     "I've had severl cut off RLE; left foreqrm, nose under nose"  . THORACOTOMY Left 06/2005  . THORACOTOMY/LOBECTOMY  09/2009; 11/2009   left; right   Current Outpatient Medications on File Prior to Visit  Medication Sig Dispense Refill  . acetaminophen (TYLENOL) 325 MG tablet Take by  mouth.    . albuterol (PROAIR HFA) 108 (90 Base) MCG/ACT inhaler Inhale 1 puff into the lungs every 6 (six) hours as needed for wheezing or shortness of breath.    Marland Kitchen albuterol (PROVENTIL) (2.5 MG/3ML) 0.083% nebulizer solution USE 1 VIAL (3 ML OR 2.5 MG TOTAL) VIA NEBULIZER EVERY 4 HOURS AS NEEDED FOR WHEEZING OR SHORTNESS OF BREATH 450 mL 3  . ALPRAZolam (XANAX) 0.5 MG tablet TAKE 1 TABLET BY MOUTH EVERY DAY AT BEDTIME AS NEEDED ANXIETY OR INSOMNIA 90 tablet 0  . amphetamine-dextroamphetamine (ADDERALL XR) 20 MG 24 hr capsule Take 1 capsule (20 mg total) by mouth every morning. 30 capsule  0  . aspirin 81 MG tablet Take 81 mg by mouth daily.     Marland Kitchen b complex-C-folic acid 1 MG capsule Take 1 capsule by mouth daily.      Marland Kitchen buPROPion (WELLBUTRIN XL) 150 MG 24 hr tablet TAKE 1 TABLET DAILY 90 tablet 3  . Calcium Carbonate-Vit D-Min (CALCIUM 1200 PO) Take by mouth.    . celecoxib (CELEBREX) 200 MG capsule TAKE 1 CAPSULE DAILY 90 capsule 2  . Cyanocobalamin 1000 MCG CAPS Take by mouth.    . doxazosin (CARDURA) 4 MG tablet Take 1 tablet (4 mg total) by mouth daily. 90 tablet 1  . DULoxetine (CYMBALTA) 60 MG capsule TAKE 1 CAPSULE DAILY 90 capsule 3  . fluticasone (FLONASE) 50 MCG/ACT nasal spray Place 2 sprays into both nostrils daily. 48 g 3  . folic acid (FOLVITE) 1 MG tablet Take by mouth.    . Glycopyrrolate-Formoterol (BEVESPI AEROSPHERE) 9-4.8 MCG/ACT AERO Inhale 2 puffs into the lungs 2 (two) times daily. 1 Inhaler 0  . levothyroxine (SYNTHROID, LEVOTHROID) 100 MCG tablet TAKE 1 TABLET EVERY MORNING 90 tablet 2  . losartan-hydrochlorothiazide (HYZAAR) 100-25 MG tablet Take 1 tablet by mouth daily. 90 tablet 3  . omeprazole (PRILOSEC) 20 MG capsule TAKE 1 CAPSULE DAILY 90 capsule 3  . OXYGEN 2lpm with sleep    . pravastatin (PRAVACHOL) 40 MG tablet Take 1 tablet (40 mg total) by mouth daily. Stop simvastatin 90 tablet 3  . pravastatin (PRAVACHOL) 40 MG tablet TAKE 1 TABLET DAILY (STOP SIMVASTATIN) 90 tablet 3  . traZODone (DESYREL) 50 MG tablet TAKE 1 TABLET AT BEDTIME AS NEEDED FOR SLEEP 60 tablet 1  . VITAMIN D, CHOLECALCIFEROL, PO Take 1 tablet by mouth daily.     No current facility-administered medications on file prior to visit.    Allergies  Allergen Reactions  . Doxycycline Nausea Only  . Erythromycin Nausea Only  . Sulfonamide Derivatives Hives   Social History   Socioeconomic History  . Marital status: Married    Spouse name: Not on file  . Number of children: 3  . Years of education: Not on file  . Highest education level: Not on file  Social Needs  .  Financial resource strain: Not on file  . Food insecurity - worry: Not on file  . Food insecurity - inability: Not on file  . Transportation needs - medical: Not on file  . Transportation needs - non-medical: Not on file  Occupational History  . Occupation: retired Optician, dispensing: RETIRED  Tobacco Use  . Smoking status: Former Smoker    Packs/day: 0.75    Years: 30.00    Pack years: 22.50    Types: Cigarettes    Last attempt to quit: 09/11/2004    Years since quitting: 13.0  . Smokeless tobacco: Never Used  Substance and Sexual Activity  . Alcohol use: Yes    Alcohol/week: 4.2 oz    Types: 7 Shots of liquor per week    Comment: 1-2 drinks nightly   . Drug use: No  . Sexual activity: Yes  Other Topics Concern  . Not on file  Social History Narrative  . Not on file      Review of Systems  All other systems reviewed and are negative.      Objective:   Physical Exam  Constitutional: She appears well-developed and well-nourished. No distress.  HENT:  Right Ear: External ear normal.  Left Ear: External ear normal.  Nose: Nose normal.  Mouth/Throat: Oropharynx is clear and moist.  Eyes: Conjunctivae are normal.  Neck: Neck supple.  Cardiovascular: Normal rate, regular rhythm and normal heart sounds.  Pulmonary/Chest: No respiratory distress. She has no wheezes. She has rhonchi. She has rales.      Abdominal: Soft. Bowel sounds are normal.  Lymphadenopathy:    She has no cervical adenopathy.  Skin: She is not diaphoretic.  Vitals reviewed.         Assessment & Plan:  Walking pneumonia I am concerned that the patient could be developing community-acquired pneumonia in the left lower lobe.  Switch to Levaquin 500 mg p.o. daily for 7 days and reassess later this week or sooner if worse.  There is no wheezing today on exam.  Therefore I will not continue prednisone.  She is complaining of some burning on her tongue.  Therefore I will start her on nystatin 1  teaspoon swish and swallow 4 times a day for possible median rhomboid glossitis secondary to steroid use and Candida (no visible thrush however).

## 2017-10-22 ENCOUNTER — Encounter: Payer: Self-pay | Admitting: Family Medicine

## 2017-10-22 ENCOUNTER — Other Ambulatory Visit: Payer: Self-pay | Admitting: Family Medicine

## 2017-10-22 MED ORDER — ALPRAZOLAM 0.5 MG PO TABS: ORAL_TABLET | ORAL | 0 refills | Status: DC

## 2017-10-22 MED ORDER — AMPHETAMINE-DEXTROAMPHET ER 20 MG PO CP24: 20.0000 mg | ORAL_CAPSULE | ORAL | 0 refills | Status: DC

## 2017-10-22 NOTE — Telephone Encounter (Signed)
Ok to refill??  Last office visit 10/09/2017.  Last refill on Xanax 04/23/2017.   Last refill on Adderall 08/30/2017.

## 2017-10-31 ENCOUNTER — Telehealth: Payer: Self-pay | Admitting: Family Medicine

## 2017-10-31 NOTE — Telephone Encounter (Signed)
Pt called wanting a 3 month supply of her Adderall written - her LRF was 10/22/17. Called and lmovm that we send those electronically now and she is not due for it until 11/19/17 to please call me back around that time and we will send it to her pharmacy.

## 2017-11-03 ENCOUNTER — Other Ambulatory Visit: Payer: Self-pay | Admitting: Family Medicine

## 2017-11-06 DIAGNOSIS — Z9889 Other specified postprocedural states: Secondary | ICD-10-CM | POA: Diagnosis not present

## 2017-11-06 DIAGNOSIS — R918 Other nonspecific abnormal finding of lung field: Secondary | ICD-10-CM | POA: Diagnosis not present

## 2017-11-06 DIAGNOSIS — Y33XXXA Other specified events, undetermined intent, initial encounter: Secondary | ICD-10-CM | POA: Diagnosis not present

## 2017-11-06 DIAGNOSIS — S2242XA Multiple fractures of ribs, left side, initial encounter for closed fracture: Secondary | ICD-10-CM | POA: Diagnosis not present

## 2017-11-06 DIAGNOSIS — Z923 Personal history of irradiation: Secondary | ICD-10-CM | POA: Diagnosis not present

## 2017-11-06 DIAGNOSIS — Z08 Encounter for follow-up examination after completed treatment for malignant neoplasm: Secondary | ICD-10-CM | POA: Diagnosis not present

## 2017-11-06 DIAGNOSIS — Z87891 Personal history of nicotine dependence: Secondary | ICD-10-CM | POA: Diagnosis not present

## 2017-11-06 DIAGNOSIS — C3492 Malignant neoplasm of unspecified part of left bronchus or lung: Secondary | ICD-10-CM | POA: Diagnosis not present

## 2017-11-06 DIAGNOSIS — Z85118 Personal history of other malignant neoplasm of bronchus and lung: Secondary | ICD-10-CM | POA: Diagnosis not present

## 2017-11-15 ENCOUNTER — Other Ambulatory Visit: Payer: Self-pay | Admitting: Family Medicine

## 2017-11-19 ENCOUNTER — Other Ambulatory Visit: Payer: Self-pay | Admitting: Family Medicine

## 2017-11-20 ENCOUNTER — Other Ambulatory Visit: Payer: Self-pay | Admitting: Family Medicine

## 2017-11-26 ENCOUNTER — Encounter: Payer: Self-pay | Admitting: Family Medicine

## 2017-11-29 ENCOUNTER — Other Ambulatory Visit: Payer: Self-pay | Admitting: Family Medicine

## 2017-11-29 MED ORDER — AMPHETAMINE-DEXTROAMPHET ER 20 MG PO CP24: 20.0000 mg | ORAL_CAPSULE | ORAL | 0 refills | Status: DC

## 2017-11-29 NOTE — Telephone Encounter (Signed)
Pt requesting refill on Adderall      LOV: 10/09/17  LRF:   10/22/17

## 2017-11-29 NOTE — Telephone Encounter (Signed)
Requesting refill on adderall to [facility].

## 2017-12-25 ENCOUNTER — Encounter: Payer: Self-pay | Admitting: Family Medicine

## 2017-12-25 ENCOUNTER — Ambulatory Visit (INDEPENDENT_AMBULATORY_CARE_PROVIDER_SITE_OTHER): Payer: Medicare Other | Admitting: Family Medicine

## 2017-12-25 VITALS — BP 110/62 | HR 78 | Temp 97.8°F | Resp 20 | Ht 67.5 in | Wt 170.0 lb

## 2017-12-25 DIAGNOSIS — F988 Other specified behavioral and emotional disorders with onset usually occurring in childhood and adolescence: Secondary | ICD-10-CM | POA: Diagnosis not present

## 2017-12-25 DIAGNOSIS — R0789 Other chest pain: Secondary | ICD-10-CM

## 2017-12-25 MED ORDER — AMPHETAMINE-DEXTROAMPHET ER 25 MG PO CP24: 25.0000 mg | ORAL_CAPSULE | ORAL | 0 refills | Status: DC

## 2017-12-25 NOTE — Progress Notes (Signed)
Subjective:    Patient ID: [name], female    DOB: 02-Apr-1941, 77 y.o.   MRN: [id number]  Abdominal Pain   Hypertension     04/2017 The patient is under a lot of stress recently. She reporst increasing levels of stress as she is caring for her husband who has had multiple strokes with significant brain damage and memory loss in addition to her father who is 32 years old and was recently removed from a nursing home.  Therefore she believes she needs to change her antidepressants. She has been on Cymbalta for several years in addition to Wellbutrin. She does report anxiety but she denies any depression at the present time. Her biggest concern is memory loss. She believes this is likely attributed to depression. When I questioned the patient further she provided several examples. For instance, this week, the patient drove her husband to [facility] for an appointment and then realized his appointment was actually in [place]. She finds herself making frequent mistakes like this. When I questioned her further, she reports that she has some much going on and so many things to keep track of, is hard for her to keep "straight" what she is supposed to be doing. She also frequently loses items such as her hearing aids her glasses in her car keys. She also lost a credit card or earlier this week. I have long question if the patient has ADD. Respectfully, the patient talks extremely fast and frequently changes subjects during our visit. Often it is hard to keep track of her questions. Her mind seems to jump from one question to another. I also have to frequently repeat myself as if she is not paying attention or processing what I'm telling her. Many times when I am answering the question she just asked, she will seem distracted. Sometimes I believe she's thinking of another question rather than focusing on the answer. Her daughter has ADD. Her grandson has ADD. Just this week, she is "forgetting assignments".  She is demonstrating poor Corporate treasurer. She is frequently losing items. However her orientation is completely normal. She is able to tell me the month and year day and date accurately. She can perform serial sevens. Her other biggest concern is change in bowel habits. She states that over the last 2 months, she is having daily diarrhea. Previously she would have 1 bowel movement a day there be well formed and normal. Now she is having 3 and 4 watery stools every day. There is also a foul odor and frequent gas and bloating. She also has occasional abdominal cramps. She denies any blood in her stool or hematochezia. She has no documented history of irritable bowel syndrome but she has independently tried a probiotic for more than a month without any benefit. Symptoms sound consistent with IBS although I cannot rule out inflammatory colitis such as lymphocytic colitis.  At that time, ,my plan was: After having known this patient now for almost a decade, I believe that the patient has long had ADD and that it is now under all the stress, is becoming more and more of a problem for her. I recommended trying Adderall XR 20 mg by mouth every morning and reassessing in 2-3 weeks to see if she notices improvement. We could also consider changing her antidepressant medications although I see more of a problem in her memory due to focus and stress and anxiety rather than depression. Patient would like to see a gastroenterologist to discuss or change  in bowel habits.I believe is more likely IBS. She denies any blood in her stool. However the patient would like to have a colonoscopy to rule out inflammatory bowel disease   05/28/17 Patient is much calmer today. She states that she has done markedly better since starting Adderall. Her thoughts are not racing. She is focusing better. She is noted improvement in her memory due to the fact she's paying attention to more. She has not had any further episodes where she  forgot doctor's appointments or other critical information. Her organization has improved at home. She is not losing items.  Her family has also noted substantial improvement including her daughter.  At that time, my plan was: Continue Adderall XR 20 mg by mouth every morning. Reassess in 6 months. Blood pressure is excellent today. Patient received her flu shot as well.  12/25/17 Patient presents today with 2 concerns.  Her first concern is chest wall pain.  The pain is located in the left anterior chest directly below and under her left breast.  She has a history of lung cancer status post wedge resections in the left and right lungs.  She underwent radiation treatments as well and is currently managed by an oncologist at [facility].  She has been in remission now for many years and doing quite well.  Recently had a CT scan in February 2019 which revealed no recurrence in the chest.  They were stable changes.  She brings a copy to review with me today in clinic.  There were chronic rib fractures on the left side in the fourth fifth and sixth ribs directly in the area where she is hurting.  Chest pain began 3 weeks ago around roughly the same time she was moving boxes.  She was doing heavy lifting.  Chest pain is tender to palpation.  She denies any exertional chest pain.  She denies any shortness of breath.  She denies any pleurisy.  She denies any cough or hemoptysis.  Second concern is her ADD.  She is currently on Adderall XR 20 mg a day.  She has seen substantial improvement in her ability to focus and maintain attention.  She is seen improvement in her memory problems.  She is also seen improvement in her drive and motivation and energy level.  However she feels like it could be better.  She still continues to have a difficult time motivating herself in the mornings.  She also has a difficult time focusing and finds herself easily and frequently distracted.  At the present time she is caring for her  husband who is having progressive memory problems secondary to his multiple sclerosis and multiple strokes.  [place].  She finds herself having a difficult time managing multiple responsibilities.  She finds herself easily distracted and forgetful.   Past Medical History:  Diagnosis Date  . ADD (attention deficit disorder)   . Allergic rhinitis   . Anemia   . Anxiety   . Arrhythmia   . Arthritis    "hands, neck, right shoulder" (07/28/2015)  . Aseptic necrosis ([place])   . Asthma dxc'd 05/2015  . Basal cell cancer   . Bleeding stomach ulcer ~ 06/2005  . COPD (chronic obstructive pulmonary disease) ([place])   . Depression   . Diverticula of colon   . Dyspnea   . Esophagitis   . Gastric ulcer with hemorrhage   . GERD (gastroesophageal  reflux disease)   . Heart murmur   . History of blood transfusion 06/2005   "related to pneumonia"  . History of kidney stones   . Hyperlipidemia   . Hypertension   . Hypothyroidism   . Kidney stones   . Lung cancer ([place])    Non small cell lung cancer, adenocarcinoma with BAC 09/2009, VATS converted to wedge resection of left upper lobe mass  . Mitral valve prolapse    "no ORs" (07/28/2015)  . Osteopenia   . Pneumonia 1970's; 06/2005  . Sleep apnea dx'd 06/2015   on oxygen at nite at 2L/[place]   . Squamous cell skin cancer    Past Surgical History:  Procedure Laterality Date  . ABDOMINAL HYSTERECTOMY  1988  . COLONOSCOPY WITH PROPOFOL N/A 06/15/2017   Procedure: COLONOSCOPY WITH PROPOFOL;  Surgeon: Carol Ada, MD;  Location: [facility];  Service: Endoscopy;  Laterality: N/A;  . HAMMER TOE SURGERY Right ~ 2010  . SALPINGOOPHORECTOMY Left 1988  . SKIN CANCER EXCISION     "I've had severl cut off RLE; left foreqrm, nose under nose"  . THORACOTOMY Left 06/2005  . THORACOTOMY/LOBECTOMY  09/2009; 11/2009   left; right   Current Outpatient Medications on File Prior to Visit    Medication Sig Dispense Refill  . acetaminophen (TYLENOL) 325 MG tablet Take by mouth.    Marland Kitchen albuterol (PROAIR HFA) 108 (90 Base) MCG/ACT inhaler Inhale 1 puff into the lungs every 6 (six) hours as needed for wheezing or shortness of breath.    Marland Kitchen albuterol (PROVENTIL) (2.5 MG/3ML) 0.083% nebulizer solution USE 1 VIAL (3 ML OR 2.5 MG TOTAL) VIA NEBULIZER EVERY 4 HOURS AS NEEDED FOR WHEEZING OR SHORTNESS OF BREATH 450 mL 3  . ALPRAZolam (XANAX) 0.5 MG tablet TAKE 1 TABLET BY MOUTH EVERY DAY AT BEDTIME AS NEEDED ANXIETY OR INSOMNIA 90 tablet 0  . b complex-C-folic acid 1 MG capsule Take 1 capsule by mouth daily.      Marland Kitchen buPROPion (WELLBUTRIN XL) 150 MG 24 hr tablet TAKE 1 TABLET DAILY 90 tablet 3  . Calcium Carbonate-Vit D-Min (CALCIUM 1200 PO) Take by mouth.    . celecoxib (CELEBREX) 200 MG capsule TAKE 1 CAPSULE DAILY 90 capsule 2  . Cyanocobalamin 1000 MCG CAPS Take by mouth.    . doxazosin (CARDURA) 4 MG tablet TAKE 1 TABLET DAILY 90 tablet 1  . DULoxetine (CYMBALTA) 60 MG capsule TAKE 1 CAPSULE DAILY 90 capsule 3  . fluticasone (FLONASE) 50 MCG/ACT nasal spray USE 2 SPRAYS IN EACH NOSTRIL DAILY 48 g 3  . folic acid (FOLVITE) 1 MG tablet Take by mouth.    . Glycopyrrolate-Formoterol (BEVESPI AEROSPHERE) 9-4.8 MCG/ACT AERO Inhale 2 puffs into the lungs 2 (two) times daily. 1 Inhaler 0  . levothyroxine (SYNTHROID, LEVOTHROID) 100 MCG tablet TAKE 1 TABLET EVERY MORNING 90 tablet 2  . losartan-hydrochlorothiazide (HYZAAR) 100-25 MG tablet TAKE 1 TABLET DAILY 90 tablet 3  . omeprazole (PRILOSEC) 20 MG capsule TAKE 1 CAPSULE DAILY 90 capsule 3  . OXYGEN 2lpm with sleep    . pravastatin (PRAVACHOL) 40 MG tablet Take 1 tablet (40 mg total) by mouth daily. Stop simvastatin 90 tablet 3  . traZODone (DESYREL) 50 MG tablet TAKE 1 TABLET AT BEDTIME AS NEEDED FOR SLEEP 60 tablet 1  . VITAMIN D, CHOLECALCIFEROL, PO Take 1 tablet by mouth daily.     No current facility-administered medications on file prior  to visit.    Allergies  Allergen Reactions  .  Doxycycline Nausea Only  . Erythromycin Nausea Only  . Sulfonamide Derivatives Hives   Social History   Socioeconomic History  . Marital status: Married    Spouse name: Not on file  . Number of children: 3  . Years of education: Not on file  . Highest education level: Not on file  Occupational History  . Occupation: retired Optician, dispensing: RETIRED  Social Needs  . Financial resource strain: Not on file  . Food insecurity:    Worry: Not on file    Inability: Not on file  . Transportation needs:    Medical: Not on file    Non-medical: Not on file  Tobacco Use  . Smoking status: Former Smoker    Packs/day: 0.75    Years: 30.00    Pack years: 22.50    Types: Cigarettes    Last attempt to quit: 09/11/2004    Years since quitting: 13.2  . Smokeless tobacco: Never Used  Substance and Sexual Activity  . Alcohol use: Yes    Alcohol/week: 4.2 oz    Types: 7 Shots of liquor per week    Comment: 1-2 drinks nightly   . Drug use: No  . Sexual activity: Yes  Lifestyle  . Physical activity:    Days per week: Not on file    Minutes per session: Not on file  . Stress: Not on file  Relationships  . Social connections:    Talks on phone: Not on file    Gets together: Not on file    Attends religious service: Not on file    Active member of club or organization: Not on file    Attends meetings of clubs or organizations: Not on file    Relationship status: Not on file  . Intimate partner violence:    Fear of current or ex partner: Not on file    Emotionally abused: Not on file    Physically abused: Not on file    Forced sexual activity: Not on file  Other Topics Concern  . Not on file  Social History Narrative  . Not on file   Family History  Problem Relation Age of Onset  . Graves' disease Daughter   . Breast cancer Maternal Aunt   . Heart disease Maternal Aunt   . Hypertension Mother   . Arthritis Maternal Grandmother       Review of Systems  Gastrointestinal: Positive for abdominal pain.  All other systems reviewed and are negative.      Objective:   Physical Exam  Constitutional: She appears well-developed and well-nourished. No distress.  Neck: No JVD present.  Cardiovascular: Normal rate, regular rhythm, normal heart sounds and intact distal pulses. Exam reveals no gallop and no friction rub.  No murmur heard. Pulmonary/Chest: Effort normal and breath sounds normal. No respiratory distress. She has no wheezes. She has no rales. She exhibits no mass, no tenderness, no bony tenderness, no edema, no deformity and no swelling. Right breast exhibits no inverted nipple, no mass, no nipple discharge, no skin change and no tenderness. Left breast exhibits no inverted nipple, no mass, no nipple discharge, no skin change and no tenderness. Breasts are symmetrical.  Abdominal: Soft. Bowel sounds are normal. She exhibits no distension and no mass. There is no tenderness. There is no rebound and no guarding.  Musculoskeletal: She exhibits no edema.  Skin: She is not diaphoretic.  Vitals reviewed.         Assessment & Plan:  Attention deficit disorder (ADD) without hyperactivity  Chest wall pain  Given the CT scan findings from 6 weeks ago, I believe that this is most likely chest wall pain.  It is probably related to muscle strain due to her heavy lifting 3-4 weeks ago coupled with the chronic rib fractures in the area where she is reporting her pain to be present.  Her symptoms do not sound cardiac in nature.  She is having no shortness of breath, no dyspnea on exertion, no angina, no cough or hemoptysis.  Therefore I recommended clinical monitoring for the next 2-3 weeks and I anticipate spontaneous gradual improvement.  If worsening, I would proceed with imaging of the chest given her past medical history.  She also recently had a normal mammogram in November.  Breast exam was performed today with a  chaperone present and there are no palpable breast masses appreciated on exam.  Furthermore the patient states that the pain feels deeper than in the breast.  The breast itself is not tender or sore.  There are no overlying skin changes in that area either.  Regarding her ADD, I will increase her Adderall from 20-25 mg every day.  I cautioned the patient about habituation and dependency.  I feel that the reason some of the problems concentrating and fatigue have returned is likely due to the fact the patient is becoming tolerant to the medication.  I recommended against increasing the medicine dramatically and continually due to potential risk from the medication.  I do feel that a small increased dose would likely be beneficial with very little chance of harm.

## 2017-12-28 ENCOUNTER — Other Ambulatory Visit: Payer: Self-pay | Admitting: Family Medicine

## 2018-01-01 ENCOUNTER — Other Ambulatory Visit: Payer: Self-pay | Admitting: Family Medicine

## 2018-02-06 ENCOUNTER — Other Ambulatory Visit: Payer: Self-pay | Admitting: Family Medicine

## 2018-02-06 NOTE — Telephone Encounter (Signed)
Refill on adderall to [facility] [address]

## 2018-02-06 NOTE — Telephone Encounter (Signed)
Pt requesting refill on Adderall      LOV: 12/25/17 LRF: 12/25/17

## 2018-02-07 ENCOUNTER — Ambulatory Visit: Payer: Medicare Other | Admitting: Internal Medicine

## 2018-02-07 MED ORDER — AMPHETAMINE-DEXTROAMPHET ER 25 MG PO CP24: 25.0000 mg | ORAL_CAPSULE | ORAL | 0 refills | Status: DC

## 2018-03-04 ENCOUNTER — Other Ambulatory Visit: Payer: Self-pay | Admitting: Family Medicine

## 2018-03-04 NOTE — Telephone Encounter (Signed)
Refill on adderall to [facility] [address]

## 2018-03-06 MED ORDER — AMPHETAMINE-DEXTROAMPHET ER 25 MG PO CP24: 25.0000 mg | ORAL_CAPSULE | ORAL | 0 refills | Status: DC

## 2018-03-06 NOTE — Telephone Encounter (Signed)
Pt requesting refill on Adderall      LOV: 12/25/17  LRF:   02/07/18

## 2018-04-08 ENCOUNTER — Other Ambulatory Visit: Payer: Self-pay | Admitting: Family Medicine

## 2018-04-08 MED ORDER — AMPHETAMINE-DEXTROAMPHET ER 25 MG PO CP24: 25.0000 mg | ORAL_CAPSULE | ORAL | 0 refills | Status: DC

## 2018-04-08 NOTE — Telephone Encounter (Signed)
Pt requesting refill on Adderall      LOV: 12/25/17  LRF:   03/06/18

## 2018-04-08 NOTE — Telephone Encounter (Signed)
adderall refill to [facility] [address]

## 2018-04-23 ENCOUNTER — Other Ambulatory Visit: Payer: Self-pay

## 2018-04-23 DIAGNOSIS — D485 Neoplasm of uncertain behavior of skin: Secondary | ICD-10-CM | POA: Diagnosis not present

## 2018-04-23 DIAGNOSIS — L570 Actinic keratosis: Secondary | ICD-10-CM | POA: Diagnosis not present

## 2018-05-21 ENCOUNTER — Encounter: Payer: Self-pay | Admitting: Family Medicine

## 2018-05-21 ENCOUNTER — Ambulatory Visit (INDEPENDENT_AMBULATORY_CARE_PROVIDER_SITE_OTHER): Payer: Medicare Other | Admitting: Family Medicine

## 2018-05-21 VITALS — BP 154/86 | HR 84 | Temp 98.2°F | Resp 22 | Ht 67.5 in | Wt 171.0 lb

## 2018-05-21 DIAGNOSIS — E039 Hypothyroidism, unspecified: Secondary | ICD-10-CM | POA: Diagnosis not present

## 2018-05-21 DIAGNOSIS — Z23 Encounter for immunization: Secondary | ICD-10-CM

## 2018-05-21 DIAGNOSIS — M25561 Pain in right knee: Secondary | ICD-10-CM | POA: Diagnosis not present

## 2018-05-21 DIAGNOSIS — Z0000 Encounter for general adult medical examination without abnormal findings: Secondary | ICD-10-CM

## 2018-05-21 DIAGNOSIS — I10 Essential (primary) hypertension: Secondary | ICD-10-CM

## 2018-05-21 DIAGNOSIS — J439 Emphysema, unspecified: Secondary | ICD-10-CM | POA: Diagnosis not present

## 2018-05-21 DIAGNOSIS — E7800 Pure hypercholesterolemia, unspecified: Secondary | ICD-10-CM | POA: Diagnosis not present

## 2018-05-21 DIAGNOSIS — Z85118 Personal history of other malignant neoplasm of bronchus and lung: Secondary | ICD-10-CM | POA: Diagnosis not present

## 2018-05-21 DIAGNOSIS — R7309 Other abnormal glucose: Secondary | ICD-10-CM | POA: Diagnosis not present

## 2018-05-21 MED ORDER — AMPHETAMINE-DEXTROAMPHET ER 15 MG PO CP24: 15.0000 mg | ORAL_CAPSULE | ORAL | 0 refills | Status: DC

## 2018-05-21 NOTE — Progress Notes (Signed)
Subjective:    Patient ID: [name], female    DOB: April 16, 1941, 77 y.o.   MRN: [id number]  HPI Here today for a complete physical exam.  Past medical history significant for non small cell lung cancer (adenocarcinoma) diagnosed 2011, VATS converted to wedge resection of left upper lobe mass.  She is followed by oncology at [facility] having completed several cycles of chemotherapy.  Most recent CT scan of the lung in February 2019 revealed stable opacity in the left upper lobe is stable and attributed from previous surgical changes.  She also had several stable nodules with no perceptible change.  Please see a copy of the note from [place] in the media section of her chart for further details.  Patient is due today for her flu shot  Colonoscopy 2018 Bone density test 2017-T score was -2.1 in the right radius.  T score in the hip was -1.0 approximately Mammogram was performed in November 2018 Immunization History  Administered Date(s) Administered  . Influenza, High Dose Seasonal PF 06/22/2015  . Influenza,inj,Quad PF,6+ Mos 05/29/2014, 05/30/2016, 05/28/2017  . Influenza-Unspecified 05/12/2014, 06/23/2015  . Pneumococcal Conjugate-13 01/16/2014  . Pneumococcal Polysaccharide-23 07/03/2012  . Td 09/12/1993  . Tdap 12/21/2012  . Zoster 09/27/2007   Past Medical History:  Diagnosis Date  . ADD (attention deficit disorder)   . Allergic rhinitis   . Anemia   . Anxiety   . Arrhythmia   . Arthritis    "hands, neck, right shoulder" (07/28/2015)  . Aseptic necrosis ([place])   . Asthma dxc'd 05/2015  . Basal cell cancer   . Bleeding stomach ulcer ~ 06/2005  . COPD (chronic obstructive pulmonary disease) ([place])   . Depression   . Diverticula of colon   . Dyspnea   . Esophagitis   . Gastric ulcer with hemorrhage   . GERD (gastroesophageal reflux disease)   . Heart murmur   . History of blood transfusion 06/2005   "related to pneumonia"  . History of kidney stones   .  Hyperlipidemia   . Hypertension   . Hypothyroidism   . Kidney stones   . Lung cancer ([place])    Non small cell lung cancer, adenocarcinoma with BAC 09/2009, VATS converted to wedge resection of left upper lobe mass  . Mitral valve prolapse    "no ORs" (07/28/2015)  . Osteopenia   . Pneumonia 1970's; 06/2005  . Sleep apnea dx'd 06/2015   on oxygen at nite at 2L/[place]   . Squamous cell skin cancer    Current Outpatient Medications on File Prior to Visit  Medication Sig Dispense Refill  . buPROPion (WELLBUTRIN XL) 150 MG 24 hr tablet TAKE 1 TABLET DAILY 90 tablet 3  . Calcium Carbonate-Vit D-Min (CALCIUM 1200 PO) Take by mouth.    . celecoxib (CELEBREX) 200 MG capsule TAKE 1 CAPSULE DAILY 90 capsule 2  . Cyanocobalamin 1000 MCG CAPS Take by mouth.    . doxazosin (CARDURA) 4 MG tablet TAKE 1 TABLET DAILY 90 tablet 1  . DULoxetine (CYMBALTA) 60 MG capsule TAKE 1 CAPSULE DAILY 90 capsule 3  . fluticasone (FLONASE) 50 MCG/ACT nasal spray USE 2 SPRAYS IN EACH NOSTRIL DAILY 48 g 3  . folic acid (FOLVITE) 1 MG tablet Take by mouth.    . Glycopyrrolate-Formoterol (BEVESPI AEROSPHERE) 9-4.8 MCG/ACT AERO Inhale 2 puffs into the lungs 2 (two) times daily. 1 Inhaler 0  . levothyroxine (SYNTHROID, LEVOTHROID) 100 MCG tablet TAKE 1 TABLET EVERY MORNING 90 tablet 2  .  losartan-hydrochlorothiazide (HYZAAR) 100-25 MG tablet TAKE 1 TABLET DAILY 90 tablet 3  . omeprazole (PRILOSEC) 20 MG capsule TAKE 1 CAPSULE DAILY 90 capsule 3  . OXYGEN 2lpm with sleep    . pravastatin (PRAVACHOL) 40 MG tablet Take 1 tablet (40 mg total) by mouth daily. Stop simvastatin 90 tablet 3  . VITAMIN D, CHOLECALCIFEROL, PO Take 1 tablet by mouth daily.    Marland Kitchen acetaminophen (TYLENOL) 325 MG tablet Take by mouth.    Marland Kitchen albuterol (PROAIR HFA) 108 (90 Base) MCG/ACT inhaler Inhale 1 puff into the lungs every 6 (six) hours as needed for wheezing or shortness of breath.    Marland Kitchen albuterol (PROVENTIL) (2.5 MG/3ML) 0.083% nebulizer solution USE 1  VIAL (3 ML OR 2.5 MG TOTAL) VIA NEBULIZER EVERY 4 HOURS AS NEEDED FOR WHEEZING OR SHORTNESS OF BREATH 450 mL 3  . ALPRAZolam (XANAX) 0.5 MG tablet TAKE 1 TABLET BY MOUTH EVERY DAY AT BEDTIME AS NEEDED ANXIETY OR INSOMNIA 90 tablet 0  . amphetamine-dextroamphetamine (ADDERALL XR) 25 MG 24 hr capsule Take 1 capsule by mouth every morning. 30 capsule 0  . b complex-C-folic acid 1 MG capsule Take 1 capsule by mouth daily.      . traZODone (DESYREL) 50 MG tablet TAKE 1 TABLET AT BEDTIME AS NEEDED FOR SLEEP (Patient not taking: Reported on 05/21/2018) 60 tablet 1   No current facility-administered medications on file prior to visit.    Allergies  Allergen Reactions  . Doxycycline Nausea Only  . Erythromycin Nausea Only  . Sulfonamide Derivatives Hives   Social History   Socioeconomic History  . Marital status: Married    Spouse name: Not on file  . Number of children: 3  . Years of education: Not on file  . Highest education level: Not on file  Occupational History  . Occupation: retired Optician, dispensing: RETIRED  Social Needs  . Financial resource strain: Not on file  . Food insecurity:    Worry: Not on file    Inability: Not on file  . Transportation needs:    Medical: Not on file    Non-medical: Not on file  Tobacco Use  . Smoking status: Former Smoker    Packs/day: 0.75    Years: 30.00    Pack years: 22.50    Types: Cigarettes    Last attempt to quit: 09/11/2004    Years since quitting: 13.6  . Smokeless tobacco: Never Used  Substance and Sexual Activity  . Alcohol use: Yes    Alcohol/week: 7.0 standard drinks    Types: 7 Shots of liquor per week    Comment: 1-2 drinks nightly   . Drug use: No  . Sexual activity: Yes  Lifestyle  . Physical activity:    Days per week: Not on file    Minutes per session: Not on file  . Stress: Not on file  Relationships  . Social connections:    Talks on phone: Not on file    Gets together: Not on file    Attends religious service:  Not on file    Active member of club or organization: Not on file    Attends meetings of clubs or organizations: Not on file    Relationship status: Not on file  . Intimate partner violence:    Fear of current or ex partner: Not on file    Emotionally abused: Not on file    Physically abused: Not on file    Forced sexual activity: Not  on file  Other Topics Concern  . Not on file  Social History Narrative  . Not on file   Family History  Problem Relation Age of Onset  . Graves' disease Daughter   . Breast cancer Maternal Aunt   . Heart disease Maternal Aunt   . Hypertension Mother   . Arthritis Maternal Grandmother       Review of Systems  All other systems reviewed and are negative.      Objective:   Physical Exam  Constitutional: She is oriented to person, place, and time. She appears well-developed and well-nourished. No distress.  HENT:  Head: Normocephalic and atraumatic.  Right Ear: External ear normal.  Left Ear: External ear normal.  Nose: Nose normal.  Mouth/Throat: Oropharynx is clear and moist. No oropharyngeal exudate.  Eyes: Pupils are equal, round, and reactive to light. Conjunctivae and EOM are normal. Right eye exhibits no discharge. Left eye exhibits no discharge. No scleral icterus.  Neck: Normal range of motion. Neck supple. No JVD present. No tracheal deviation present. No thyromegaly present.  Cardiovascular: Normal rate, regular rhythm, normal heart sounds and intact distal pulses. Exam reveals no gallop and no friction rub.  No murmur heard. Pulmonary/Chest: Effort normal and breath sounds normal. No stridor. No respiratory distress. She has no wheezes. She has no rales. She exhibits no tenderness.  Abdominal: Soft. Bowel sounds are normal. She exhibits no distension and no mass. There is no tenderness. There is no rebound and no guarding. No hernia.  Musculoskeletal: She exhibits no edema.  Lymphadenopathy:    She has no cervical adenopathy.    Neurological: She is alert and oriented to person, place, and time. She displays normal reflexes. No cranial nerve deficit or sensory deficit. She exhibits normal muscle tone. Coordination normal.  Skin: Skin is warm. She is not diaphoretic. No erythema.  Psychiatric: She has a normal mood and affect. Her behavior is normal. Judgment and thought content normal.  Vitals reviewed.         Assessment & Plan:  General medical exam  Pulmonary emphysema, unspecified emphysema type ([place])  Pure hypercholesterolemia  Benign essential HTN  Hypothyroidism, unspecified type  History of lung cancer  Patient's blood pressure today is slightly elevated.  She is not certain that she is taking all of her medications.  I have asked her to check her blood pressure in the morning and the afternoon every day for the next week and provide those values to [place] so that we can uptitrate her medication if necessary.  I would also like to wean down on her Adderall XR to 15 mg a day to see if this would also help with her blood pressure.  However I believe the patient needs a medication.  Even she will admit that her thoughts race through her mind and is difficult for her to focus and complete any necessary task.  She quite often forgets to take her medication or forgets to monitor her husband's medication.  Even when talking to her, she skips from subject to subject to subject quickly.  It is very difficult to follow her.  She is very hyper in her mannerisms.  I truly believe that treating her ADHD improves her quality of life and also her safety and helps her to care for her husband.  Without it, I think she would make too many careless areas that could likely be dangerous.  However I do believe we need to decrease the dose due to her blood  pressure and also her age.  She received her flu shot today.  Her mammogram is scheduled for November.  She does not require Pap smear.  Her colonoscopy is up-to-date.  I will  repeat her bone density test next year.  I have encouraged her to use 1200 mg a day of calcium and 1000 units a day of vitamin D.  She has been cleared for annual follow-up with her oncologist at [facility] given the fact she is 8 years removed from her cancer and has been cancer free.  She will see them in February.  Follow-up on blood pressures as discussed in the history of present illness.

## 2018-05-22 ENCOUNTER — Ambulatory Visit
Admission: RE | Admit: 2018-05-22 | Discharge: 2018-05-22 | Disposition: A | Discharge status: Home / Self Care | Payer: Medicare Other | Source: Ambulatory Visit | Attending: Family Medicine | Admitting: Family Medicine

## 2018-05-22 DIAGNOSIS — M25561 Pain in right knee: Secondary | ICD-10-CM

## 2018-05-24 LAB — COMPLETE METABOLIC PANEL WITH GFR
AG Ratio: 1.5 (calc) (ref 1.0–2.5)
ALT: 17 U/L (ref 6–29)
AST: 22 U/L (ref 10–35)
Albumin: 4.3 g/dL (ref 3.6–5.1)
Alkaline phosphatase (APISO): 71 U/L (ref 33–130)
BUN: 21 mg/dL (ref 7–25)
CO2: 24 mmol/L (ref 20–32)
Calcium: 9.9 mg/dL (ref 8.6–10.4)
Chloride: 98 mmol/L (ref 98–110)
Creat: 0.83 mg/dL (ref 0.60–0.93)
GFR, Est African American: 80 mL/min/{1.73_m2} (ref >=60)
GFR, Est Non African American: 69 mL/min/{1.73_m2} (ref >=60)
Globulin: 2.8 g/dL (calc) (ref 1.9–3.7)
Glucose, Bld: 108 mg/dL — ABNORMAL HIGH (ref 65–99)
Potassium: 5.1 mmol/L (ref 3.5–5.3)
Sodium: 138 mmol/L (ref 135–146)
Total Bilirubin: 0.3 mg/dL (ref 0.2–1.2)
Total Protein: 7.1 g/dL (ref 6.1–8.1)

## 2018-05-24 LAB — HEMOGLOBIN A1C W/OUT EAG: Hgb A1c MFr Bld: 5.3 % of total Hgb (ref <5.7)

## 2018-05-24 LAB — CBC WITH DIFFERENTIAL/PLATELET
Basophils Absolute: 67 cells/uL (ref 0–200)
Basophils Relative: 0.8 %
Eosinophils Absolute: 202 cells/uL (ref 15–500)
Eosinophils Relative: 2.4 %
HCT: 39.9 % (ref 35.0–45.0)
Hemoglobin: 13.6 g/dL (ref 11.7–15.5)
Lymphs Abs: 2453 cells/uL (ref 850–3900)
MCH: 33.4 pg — ABNORMAL HIGH (ref 27.0–33.0)
MCHC: 34.1 g/dL (ref 32.0–36.0)
MCV: 98 fL (ref 80.0–100.0)
MPV: 9.5 fL (ref 7.5–12.5)
Monocytes Relative: 8.3 %
Neutro Abs: 4981 cells/uL (ref 1500–7800)
Neutrophils Relative %: 59.3 %
Platelets: 493 10*3/uL — ABNORMAL HIGH (ref 140–400)
RBC: 4.07 10*6/uL (ref 3.80–5.10)
RDW: 12.3 % (ref 11.0–15.0)
Total Lymphocyte: 29.2 %
WBC mixed population: 697 cells/uL (ref 200–950)
WBC: 8.4 10*3/uL (ref 3.8–10.8)

## 2018-05-24 LAB — LIPID PANEL
Cholesterol: 221 mg/dL — ABNORMAL HIGH (ref <200)
HDL: 65 mg/dL (ref >50)
LDL Cholesterol (Calc): 124 mg/dL (calc) — ABNORMAL HIGH
Non-HDL Cholesterol (Calc): 156 mg/dL (calc) — ABNORMAL HIGH (ref <130)
Total CHOL/HDL Ratio: 3.4 (calc) (ref <5.0)
Triglycerides: 201 mg/dL — ABNORMAL HIGH (ref <150)

## 2018-05-24 LAB — TSH: TSH: 3.82 mIU/L (ref 0.40–4.50)

## 2018-05-24 LAB — TEST AUTHORIZATION

## 2018-05-30 ENCOUNTER — Encounter: Payer: Self-pay | Admitting: Family Medicine

## 2018-05-30 MED ORDER — ALPRAZOLAM 0.5 MG PO TABS: ORAL_TABLET | ORAL | 0 refills | Status: DC

## 2018-05-30 MED ORDER — TRAZODONE HCL 50 MG PO TABS: 50.0000 mg | ORAL_TABLET | Freq: Every evening | ORAL | 1 refills | Status: DC | PRN

## 2018-05-30 NOTE — Telephone Encounter (Signed)
Requesting refill    Trazodone & Xanax (to mail order)  LOV: 05/21/18  LRF:  Xanax 10/22/17 & Trazodone 11/15/17

## 2018-06-17 ENCOUNTER — Other Ambulatory Visit: Payer: Self-pay | Admitting: Family Medicine

## 2018-06-19 ENCOUNTER — Ambulatory Visit (INDEPENDENT_AMBULATORY_CARE_PROVIDER_SITE_OTHER)
Admission: RE | Admit: 2018-06-19 | Discharge: 2018-06-19 | Disposition: A | Discharge status: Home / Self Care | Payer: Medicare Other | Source: Ambulatory Visit | Attending: Internal Medicine | Admitting: Internal Medicine

## 2018-06-19 ENCOUNTER — Ambulatory Visit (INDEPENDENT_AMBULATORY_CARE_PROVIDER_SITE_OTHER): Payer: Medicare Other | Admitting: Internal Medicine

## 2018-06-19 ENCOUNTER — Encounter: Payer: Self-pay | Admitting: Internal Medicine

## 2018-06-19 VITALS — BP 112/68 | HR 74 | Ht 67.5 in | Wt 175.6 lb

## 2018-06-19 DIAGNOSIS — J449 Chronic obstructive pulmonary disease, unspecified: Secondary | ICD-10-CM

## 2018-06-19 DIAGNOSIS — J9611 Chronic respiratory failure with hypoxia: Secondary | ICD-10-CM

## 2018-06-19 DIAGNOSIS — R0602 Shortness of breath: Secondary | ICD-10-CM | POA: Diagnosis not present

## 2018-06-19 NOTE — Progress Notes (Signed)
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 Pulmonary Consult    Self referral. She has seen Dr. Lamonte Sakai in the past- last in 2012. She has been seeing Dr. Corrin Parker at [facility] and is here today to re-est care. She c/o DOE with cleaning her house or taking out the trash. She is on O2 with sleep at 2lpm. She uses proiar 1 x per wk on average and has a neb with albuterol that she rarely uses.   doe x [facility] = can't walk a nl pace on a flat grade s sob but does fine slow and flat eg shopping at slow pace / can't do ramps Doe Never changed since empyema despite having RT and chemo for BAC dx 09/14/09 at [facility] with [facility] 11/2009 and persistent bilateral MPN's under surveillance at [facility] but no recent bx's or further chemo.  Uses  advair 250 twice daily ? Benefit > saba doesn't really help any of her symptoms/ no signifcant chronic or assoc  Coughing. rec Plan A = Automatic = bevespi Take 2 puffs first thing in am and then another 2 puffs about 12 hours later.  Work on inhaler technique: Plan B = Backup Only use your albuterol as a rescue medication  Plan C = Crisis - only use your albuterol nebulizer if you first try Plan B and it fails to help > ok to use the nebulizer up to every 4 hours but if start needing it regularly call for immediate appointment    08/10/2017  f/u ov/[name] re: GOLD II COPD  Chief Complaint  Patient presents with  . Follow-up    Breathing is doing well.  She uses  her albuterol inhaler and neb rarely.    doe improved to [facility] = can walk nl pace, flat grade, can't hurry or go uphills or steps s sob   No noct symtoms   rec Continue the Bevespi Take 2 puffs first thing in am and then another 2 puffs about 12 hours later.      06/19/2018 acute extended ov/[name] re: GOLD II copd/ worse doe  Chief Complaint  Patient presents with  . Acute Visit    Increased SOB over the past several months, relates to hot weather and stress. She has been using her albuterol inhaler 2-3 x per day.   acutely worse/ persistent daily  doe with onset of really hot weather 3 months -  ok rest and ok hs 2lpm  Doe = MMRC3 = can't walk 100 yards even at a slow pace at a flat grade s stopping due to sob   Assoc with severe fatigue / need to go to bed early and go back to bed when gets up in am  No assoc cough  Started using saba again w/in 3 h of am  bevespi and feels it probably  helps when she rechallenges (better ex tol after  saba)    No obvious day to day or daytime variability or assoc excess/ purulent sputum or mucus plugs or hemoptysis or cp or chest tightness, subjective wheeze or overt sinus or hb symptoms.   Sleeping ok flat  without nocturnal  or early am exacerbation  of respiratory  c/o's or need for noct saba. Also denies any obvious fluctuation of symptoms with weather or environmental changes or other aggravating or alleviating factors except as outlined above   No unusual exposure hx or h/o childhood pna/ asthma or knowledge of premature birth.  Current Allergies, Complete Past Medical History, Past Surgical History, Family History, and Social History were reviewed in [employer] record.  ROS  The following are not active complaints unless bolded Hoarseness, sore throat, dysphagia, dental problems, itching, sneezing,  nasal congestion or discharge of excess mucus or purulent secretions, ear ache,   fever, chills, sweats, unintended wt loss  or wt gain, classically pleuritic or exertional cp,  orthopnea pnd or arm/hand swelling  or leg swelling, presyncope, palpitations, abdominal pain, anorexia, nausea, vomiting, diarrhea  or change in bowel habits or change in bladder habits, change in stools or change in urine, dysuria, hematuria,  rash, arthralgias, visual complaints, headache, numbness, weakness or ataxia or problems with walking or coordination,  change in mood or  memory.        Current Meds  Medication Sig  . acetaminophen (TYLENOL) 325 MG tablet Take by mouth.  Marland Kitchen albuterol (PROAIR HFA) 108 (90 Base) MCG/ACT inhaler Inhale 1 puff into the lungs every 6 (six) hours as needed for wheezing or shortness of breath.  Marland Kitchen albuterol (PROVENTIL) (2.5 MG/3ML) 0.083% nebulizer solution USE 1 VIAL (3 ML OR 2.5 MG TOTAL) VIA NEBULIZER EVERY 4 HOURS AS NEEDED FOR WHEEZING OR SHORTNESS OF BREATH  . ALPRAZolam (XANAX) 0.5 MG tablet TAKE 1 TABLET BY MOUTH EVERY DAY AT BEDTIME AS NEEDED ANXIETY OR INSOMNIA  . amphetamine-dextroamphetamine (ADDERALL XR) 15 MG 24 hr capsule Take 1 capsule by mouth every morning.  Marland Kitchen b complex-C-folic acid 1 MG capsule Take 1 capsule by mouth daily.    Marland Kitchen buPROPion (WELLBUTRIN XL) 150 MG 24 hr tablet TAKE 1 TABLET DAILY  . Calcium Carbonate-Vit D-Min (CALCIUM 1200 PO) Take by mouth.  . celecoxib (CELEBREX) 200 MG capsule TAKE 1 CAPSULE DAILY  . Cyanocobalamin 1000 MCG CAPS Take by mouth.  . doxazosin (CARDURA) 4 MG tablet TAKE 1 TABLET DAILY  . DULoxetine (CYMBALTA) 60 MG capsule TAKE 1 CAPSULE DAILY  . fluticasone (FLONASE) 50 MCG/ACT nasal spray USE 2 SPRAYS IN EACH NOSTRIL DAILY  . folic acid (FOLVITE) 1 MG tablet Take by mouth.  . Glycopyrrolate-Formoterol (BEVESPI AEROSPHERE) 9-4.8 MCG/ACT AERO Inhale 2 puffs into the lungs 2 (two) times daily.  Marland Kitchen levothyroxine (SYNTHROID, LEVOTHROID) 100 MCG tablet TAKE 1 TABLET EVERY MORNING  . losartan-hydrochlorothiazide (HYZAAR) 100-25 MG tablet TAKE 1 TABLET DAILY  .  omeprazole (PRILOSEC) 20 MG capsule TAKE 1 CAPSULE DAILY  . OXYGEN 2lpm with sleep  . pravastatin (PRAVACHOL) 40 MG tablet Take 1 tablet (40 mg total) by mouth daily. Stop simvastatin  . traZODone (DESYREL) 50 MG tablet Take 1 tablet (50 mg total) by mouth at bedtime as needed. for sleep  . VITAMIN D, CHOLECALCIFEROL, PO Take 1 tablet by mouth daily.             Objective:   Physical Exam    amb wf rambling hx  Vital signs reviewed - Note on arrival  02 sats  97% on RA    06/19/2018       175  08/10/2017     170   06/29/17 166 lb (75.3 kg)  05/28/17 164 lb (74.4 kg)  05/03/17 166 lb (75.3 kg)      HEENT: nl dentition / oropharynx. Nl external ear canals without cough reflex -  Mild bilateral non-specific turbinate edema     NECK :  without JVD/Nodes/TM/ nl carotid upstrokes bilaterally   LUNGS: no acc muscle use,  Mild barrel  contour chest wall with bilateral  Distant bs s audible wheeze and  without cough on insp or exp maneuver and mild  Hyperresonant  to  percussion bilaterally     CV:  RRR  no s3 or murmur or increase in P2, and no edema   ABD:  soft and nontender with pos late insp Hoover's  in the supine position. No bruits or organomegaly appreciated, bowel sounds nl  MS:   Nl gait/  ext warm without deformities, calf tenderness, cyanosis or clubbing No obvious joint restrictions   SKIN: warm and dry without lesions    NEURO:  alert, approp, nl sensorium with  no motor or cerebellar deficits apparent.     CXR PA and Lateral:   06/19/2018 :    I personally reviewed images and   impression as follows:   Copd/ LUL scarring further retracted vs priors / no acute changes                   Assessment:

## 2018-06-19 NOTE — Patient Instructions (Addendum)
Please remember to go to the  x-ray department downstairs in the basement  for your tests - we will call you with the results when they are available.      To get the most out of exercise, you need to PACE yourself  to be continuously aware that you are short of breath, but never out of breath, for 30 minutes daily. As you improve, it will actually be easier for you to do the same amount of exercise  in  30 minutes so always push to the level where you are short of breath.      Please schedule a follow up office visit in 4 weeks, sooner if needed full pfts

## 2018-06-20 ENCOUNTER — Ambulatory Visit (INDEPENDENT_AMBULATORY_CARE_PROVIDER_SITE_OTHER): Payer: Medicare Other | Admitting: Family Medicine

## 2018-06-20 ENCOUNTER — Encounter: Payer: Self-pay | Admitting: Family Medicine

## 2018-06-20 ENCOUNTER — Encounter: Payer: Self-pay | Admitting: Internal Medicine

## 2018-06-20 VITALS — BP 136/74 | HR 80 | Temp 97.7°F | Resp 20 | Ht 67.5 in | Wt 174.0 lb

## 2018-06-20 DIAGNOSIS — L989 Disorder of the skin and subcutaneous tissue, unspecified: Secondary | ICD-10-CM | POA: Diagnosis not present

## 2018-06-20 NOTE — Progress Notes (Signed)
Left detailed msg ok per [facility]

## 2018-06-20 NOTE — Assessment & Plan Note (Addendum)
See care everywhere pulmonary notes from 2016/ Govert > rx 2lpm hs  - 06/19/2018  Walked RA x 3 laps @ 185 ft each stopped due to  End of study, moderately fast pace, no desat but sob at end       She is clearly not MMRC 3 here and needs to learn a slower pace. Says been to rehab x 3 and doesn't want to go back but demonstrated very little insight into conditioning /pacing which I reviewed in detail   Sill on noct 02, no need for amb 02 based on today's eval    I had an extended discussion with the patient reviewing all relevant studies completed to date and  lasting 25 minutes of a 40  minute acute extended office visit to re-establish with me re eval of severe non-specific but potentially very serious refractory respiratory symptoms of uncertain and potentially multiple  etiologies.  See device teaching which extended face to face time for this visit   Each maintenance medication was reviewed in detail including most importantly the difference between maintenance and prns and under what circumstances the prns are to be triggered using an action plan format that is not reflected in the computer generated alphabetically organized AVS.    Please see AVS for specific instructions unique to this office visit that I personally wrote and verbalized to the the pt in detail and then reviewed with pt  by my nurse highlighting any changes in therapy/plan of care  recommended at today's visit.       Marland Kitchen

## 2018-06-20 NOTE — Assessment & Plan Note (Addendum)
Quit smoking 2006 PFT's  08/07/11  FEV1 2.14 (89 % ) ratio 69  with DLCO  73 % corrects to 108 % for alv volume  - Spirometry 06/29/2017  FEV1 1.24 (52%)  Ratio 64 on advair -  Allergy profile 06/29/17  >  Eos 0.2/  IgE  13 RAST neg  - 06/29/2017  try bevespi  2bid > improved as of 08/10/18    -  Spirometry 06/19/2018  FEV1 1.4 (57%)  Ratio 64 p bevespi x 2 pffs   - The proper method of use, as well as anticipated side effects, of a metered-dose inhaler are discussed and demonstrated to the patient using teachback       DDX of  difficult airways management almost all start with A and  include Adherence, Ace Inhibitors, Acid Reflux, Active Sinus Disease, Alpha 1 Antitripsin deficiency, Anxiety masquerading as Airways dz,  ABPA,  Allergy(esp in young), Aspiration (esp in elderly), Adverse effects of meds,  Active smoking or vaping, A bunch of PE's (a small clot burden can't cause this syndrome unless there is already severe underlying pulm or vascular dz with poor reserve) plus two Bs  = Bronchiectasis and Beta blocker use..and one C= CHF    Adherence is always the initial "prime suspect" and is a multilayered concern that requires a "trust but verify" approach in every patient - starting with knowing how to use medications, especially inhalers, correctly, keeping up with refills and understanding the fundamental difference between maintenance and prns vs those medications only taken for a very short course and then stopped and not refilled.  - return with all meds in hand using a trust but verify approach to confirm accurate Medication  Reconciliation The principal here is that until we are certain that the  patients are doing what we've asked, it makes no sense to ask them to do more.  - see hfa teaching   ? Anxiety/deconditioning  > usually at the bottom of this list of usual suspects but should be much higher on this pt's based on H and P and note already on psychotropics and may interfere with  adherence and also interpretation of response or lack thereof to symptom management which can be quite subjective.   ? Acid (or non-acid) GERD > always difficult to exclude as up to 75% of pts in some series report no assoc GI/ Heartburn symptoms> rec continue max (24h)  acid suppression and diet restrictions/ reviewed with pt    ? Allergies/ asthma > lack of variability/ cough / noct symptoms and prior w/u neg as above all strongly against so ok to continue just the bevespi as Pt is Group B in terms of symptom/risk and laba/lama therefore appropriate rx at this point.   ? Adverse drug effects > none of the usual suspects listed    Will return in 4 weeks to regroup with full pfts then

## 2018-06-21 NOTE — Progress Notes (Signed)
Subjective:    Patient ID: [name], female    DOB: 02-06-1941, 77 y.o.   MRN: [id number]  Patient presents today concerned with lesions on her right leg.  There are approximately 5 areas that were treated 5 weeks ago by her dermatologist with what sounds like liquid nitrogen cryotherapy.  Each of the lesions now is a violaceous macule approximately 1 cm in diameter.  There is a larger lesion on the anterior surface of the right shin this approximately 1.5 cm in diameter.  It has a scab in the center that is approximately 3 mm in diameter.  There is no bright red erythema.  There is hyperpigmentation consistent with a healing scar.  There is no warmth or tenderness or pain.  Patient is concerned that they may be infected or that is taking a prolonged period of time for them to heal.  There is no fluctuance.  There is no streaking erythema. Past Medical History:  Diagnosis Date  . ADD (attention deficit disorder)   . Allergic rhinitis   . Anemia   . Anxiety   . Arrhythmia   . Arthritis    "hands, neck, right shoulder" (07/28/2015)  . Aseptic necrosis ([place])   . Asthma dxc'd 05/2015  . Basal cell cancer   . Bleeding stomach ulcer ~ 06/2005  . COPD (chronic obstructive pulmonary disease) ([place])   . Depression   . Diverticula of colon   . Dyspnea   . Esophagitis   . Gastric ulcer with hemorrhage   . GERD (gastroesophageal reflux disease)   . Heart murmur   . History of blood transfusion 06/2005   "related to pneumonia"  . History of kidney stones   . Hyperlipidemia   . Hypertension   . Hypothyroidism   . Kidney stones   . Lung cancer ([place])    Non small cell lung cancer, adenocarcinoma with BAC 09/2009, VATS converted to wedge resection of left upper lobe mass  . Mitral valve prolapse    "no ORs" (07/28/2015)  . Osteopenia   . Pneumonia 1970's; 06/2005  . Sleep apnea dx'd 06/2015   on oxygen at nite at 2L/[place]   . Squamous cell skin cancer    Past Surgical History:    Procedure Laterality Date  . ABDOMINAL HYSTERECTOMY  1988  . COLONOSCOPY WITH PROPOFOL N/A 06/15/2017   Procedure: COLONOSCOPY WITH PROPOFOL;  Surgeon: Carol Ada, MD;  Location: [facility];  Service: Endoscopy;  Laterality: N/A;  . HAMMER TOE SURGERY Right ~ 2010  . SALPINGOOPHORECTOMY Left 1988  . SKIN CANCER EXCISION     "I've had severl cut off RLE; left foreqrm, nose under nose"  . THORACOTOMY Left 06/2005  . THORACOTOMY/LOBECTOMY  09/2009; 11/2009   left; right   Current Outpatient Medications on File Prior to Visit  Medication Sig Dispense Refill  . acetaminophen (TYLENOL) 325 MG tablet Take by mouth.    Marland Kitchen albuterol (PROAIR HFA) 108 (90 Base) MCG/ACT inhaler Inhale 1 puff into the lungs every 6 (six) hours as needed for wheezing or shortness of breath.    Marland Kitchen albuterol (PROVENTIL) (2.5 MG/3ML) 0.083% nebulizer solution USE 1 VIAL (3 ML OR 2.5 MG TOTAL) VIA NEBULIZER EVERY 4 HOURS AS NEEDED FOR WHEEZING OR SHORTNESS OF BREATH 450 mL 3  . ALPRAZolam (XANAX) 0.5 MG tablet TAKE 1 TABLET BY MOUTH EVERY DAY AT BEDTIME AS NEEDED ANXIETY OR INSOMNIA 90 tablet 0  . amphetamine-dextroamphetamine (ADDERALL XR) 15 MG 24 hr capsule Take 1 capsule  by mouth every morning. 30 capsule 0  . b complex-C-folic acid 1 MG capsule Take 1 capsule by mouth daily.      Marland Kitchen buPROPion (WELLBUTRIN XL) 150 MG 24 hr tablet TAKE 1 TABLET DAILY 90 tablet 3  . Calcium Carbonate-Vit D-Min (CALCIUM 1200 PO) Take by mouth.    . celecoxib (CELEBREX) 200 MG capsule TAKE 1 CAPSULE DAILY 90 capsule 2  . Cyanocobalamin 1000 MCG CAPS Take by mouth.    . doxazosin (CARDURA) 4 MG tablet TAKE 1 TABLET DAILY 90 tablet 4  . DULoxetine (CYMBALTA) 60 MG capsule TAKE 1 CAPSULE DAILY 90 capsule 3  . fluticasone (FLONASE) 50 MCG/ACT nasal spray USE 2 SPRAYS IN EACH NOSTRIL DAILY 48 g 3  . folic acid (FOLVITE) 1 MG tablet Take by mouth.    . Glycopyrrolate-Formoterol (BEVESPI AEROSPHERE) 9-4.8 MCG/ACT AERO Inhale 2 puffs into the  lungs 2 (two) times daily. 1 Inhaler 0  . levothyroxine (SYNTHROID, LEVOTHROID) 100 MCG tablet TAKE 1 TABLET EVERY MORNING 90 tablet 2  . losartan-hydrochlorothiazide (HYZAAR) 100-25 MG tablet TAKE 1 TABLET DAILY 90 tablet 3  . omeprazole (PRILOSEC) 20 MG capsule TAKE 1 CAPSULE DAILY 90 capsule 3  . OXYGEN 2lpm with sleep    . pravastatin (PRAVACHOL) 40 MG tablet Take 1 tablet (40 mg total) by mouth daily. Stop simvastatin 90 tablet 3  . traZODone (DESYREL) 50 MG tablet Take 1 tablet (50 mg total) by mouth at bedtime as needed. for sleep 90 tablet 1  . VITAMIN D, CHOLECALCIFEROL, PO Take 1 tablet by mouth daily.     No current facility-administered medications on file prior to visit.    Allergies  Allergen Reactions  . Doxycycline Nausea Only  . Erythromycin Nausea Only  . Sulfonamide Derivatives Hives   Social History   Socioeconomic History  . Marital status: Married    Spouse name: Not on file  . Number of children: 3  . Years of education: Not on file  . Highest education level: Not on file  Occupational History  . Occupation: retired Optician, dispensing: RETIRED  Social Needs  . Financial resource strain: Not on file  . Food insecurity:    Worry: Not on file    Inability: Not on file  . Transportation needs:    Medical: Not on file    Non-medical: Not on file  Tobacco Use  . Smoking status: Former Smoker    Packs/day: 0.75    Years: 30.00    Pack years: 22.50    Types: Cigarettes    Last attempt to quit: 09/11/2004    Years since quitting: 13.7  . Smokeless tobacco: Never Used  Substance and Sexual Activity  . Alcohol use: Yes    Alcohol/week: 7.0 standard drinks    Types: 7 Shots of liquor per week    Comment: 1-2 drinks nightly   . Drug use: No  . Sexual activity: Yes  Lifestyle  . Physical activity:    Days per week: Not on file    Minutes per session: Not on file  . Stress: Not on file  Relationships  . Social connections:    Talks on phone: Not on file     Gets together: Not on file    Attends religious service: Not on file    Active member of club or organization: Not on file    Attends meetings of clubs or organizations: Not on file    Relationship status: Not on file  .  Intimate partner violence:    Fear of current or ex partner: Not on file    Emotionally abused: Not on file    Physically abused: Not on file    Forced sexual activity: Not on file  Other Topics Concern  . Not on file  Social History Narrative  . Not on file   Family History  Problem Relation Age of Onset  . Graves' disease Daughter   . Breast cancer Maternal Aunt   . Heart disease Maternal Aunt   . Hypertension Mother   . Arthritis Maternal Grandmother      Review of Systems  All other systems reviewed and are negative.      Objective:   Physical Exam  Constitutional: She appears well-developed and well-nourished. No distress.  Cardiovascular: Normal rate, regular rhythm, normal heart sounds and intact distal pulses. Exam reveals no gallop and no friction rub.  No murmur heard. Pulmonary/Chest: Effort normal and breath sounds normal. No respiratory distress. She has no wheezes. She has no rales. She exhibits no tenderness.  Musculoskeletal: She exhibits no edema.       Right lower leg: She exhibits no tenderness, no bony tenderness, no swelling, no edema and no deformity.       Legs: Skin: She is not diaphoretic.  Vitals reviewed.         Assessment & Plan:  Skin lesion of right leg  Each of the above lesions are 1 to 1.5 cm in diameter and appear to be healing scars.  I explained that wounds on the leg take longer to heal usually particularly for a female patient especially if there is any pitting edema.  I see no evidence of secondary cellulitis or abscess formation.  The lesion on the distal anterior right calf does have a scab which makes me concerned about possible residual malignancy.  If this lesion does not completely heal, she may not  require an excisional biopsy.  However the present time is only 3 mm in diameter and is difficult to ascertain if this is healing or persistent pathology.  Therefore I recommended continued tincture of time over the next 2 to 3 weeks to see if lesions will completely resolve.  If not, an excisional biopsy may be warranted of the distal shin lesion.  However the others appear completely normal and seem to be healing well

## 2018-06-26 ENCOUNTER — Other Ambulatory Visit: Payer: Self-pay | Admitting: Family Medicine

## 2018-07-08 ENCOUNTER — Other Ambulatory Visit: Payer: Self-pay | Admitting: Family Medicine

## 2018-07-08 DIAGNOSIS — Z1231 Encounter for screening mammogram for malignant neoplasm of breast: Secondary | ICD-10-CM

## 2018-07-10 DIAGNOSIS — H35033 Hypertensive retinopathy, bilateral: Secondary | ICD-10-CM | POA: Diagnosis not present

## 2018-07-10 DIAGNOSIS — H04123 Dry eye syndrome of bilateral lacrimal glands: Secondary | ICD-10-CM | POA: Diagnosis not present

## 2018-07-10 DIAGNOSIS — Z961 Presence of intraocular lens: Secondary | ICD-10-CM | POA: Diagnosis not present

## 2018-07-10 DIAGNOSIS — H40013 Open angle with borderline findings, low risk, bilateral: Secondary | ICD-10-CM | POA: Diagnosis not present

## 2018-07-10 LAB — HM DIABETES EYE EXAM

## 2018-07-18 ENCOUNTER — Other Ambulatory Visit: Payer: Self-pay | Admitting: Internal Medicine

## 2018-07-19 ENCOUNTER — Ambulatory Visit (INDEPENDENT_AMBULATORY_CARE_PROVIDER_SITE_OTHER): Payer: Medicare Other | Admitting: Internal Medicine

## 2018-07-19 ENCOUNTER — Encounter: Payer: Self-pay | Admitting: Internal Medicine

## 2018-07-19 VITALS — BP 114/80 | HR 80 | Ht 65.0 in | Wt 173.0 lb

## 2018-07-19 DIAGNOSIS — J449 Chronic obstructive pulmonary disease, unspecified: Secondary | ICD-10-CM

## 2018-07-19 DIAGNOSIS — R059 Cough, unspecified: Secondary | ICD-10-CM

## 2018-07-19 DIAGNOSIS — J9611 Chronic respiratory failure with hypoxia: Secondary | ICD-10-CM | POA: Diagnosis not present

## 2018-07-19 DIAGNOSIS — J302 Other seasonal allergic rhinitis: Secondary | ICD-10-CM

## 2018-07-19 DIAGNOSIS — R05 Cough: Secondary | ICD-10-CM

## 2018-07-19 LAB — PULMONARY FUNCTION TEST
DL/VA % pred: 93 %
DL/VA: 4.62 ml/min/mmHg/L
DLCO cor % pred: 70 %
DLCO cor: 18.09 ml/min/mmHg
DLCO unc % pred: 71 %
DLCO unc: 18.2 ml/min/mmHg
FEF 25-75 Post: 1.12 L/sec
FEF 25-75 Pre: 0.78 L/sec
FEF2575-%Change-Post: 42 %
FEF2575-%Pred-Post: 67 %
FEF2575-%Pred-Pre: 47 %
FEV1-%Change-Post: 13 %
FEV1-%Pred-Post: 74 %
FEV1-%Pred-Pre: 66 %
FEV1-Post: 1.63 L
FEV1-Pre: 1.44 L
FEV1FVC-%Change-Post: 8 %
FEV1FVC-%Pred-Pre: 84 %
FEV6-%Change-Post: 5 %
FEV6-%Pred-Post: 86 %
FEV6-%Pred-Pre: 82 %
FEV6-Post: 2.39 L
FEV6-Pre: 2.26 L
FEV6FVC-%Change-Post: 0 %
FEV6FVC-%Pred-Post: 104 %
FEV6FVC-%Pred-Pre: 104 %
FVC-%Change-Post: 4 %
FVC-%Pred-Post: 82 %
FVC-%Pred-Pre: 79 %
FVC-Post: 2.4 L
FVC-Pre: 2.29 L
Post FEV1/FVC ratio: 68 %
Post FEV6/FVC ratio: 100 %
Pre FEV1/FVC ratio: 63 %
Pre FEV6/FVC Ratio: 99 %
RV % pred: 96 %
RV: 2.28 L
TLC % pred: 91 %
TLC: 4.78 L

## 2018-07-19 MED ORDER — GLYCOPYRROLATE-FORMOTEROL 9-4.8 MCG/ACT IN AERO: 2.0000 | INHALATION_SPRAY | Freq: Two times a day (BID) | RESPIRATORY_TRACT | 0 refills | Status: DC

## 2018-07-19 NOTE — Patient Instructions (Addendum)
No change in your medications   Work on inhaler technique:  relax and gently blow all the way out then take a nice smooth deep breath back in, triggering the inhaler at same time you start breathing in.  Hold for up to 5 seconds if you can. Blow out thru nose. Rinse and gargle with water when done   Please see patient coordinator before you leave today  to schedule sinus ct and I will call you with referral to ent if needed     Please schedule a follow up visit in 6  months but call sooner if needed

## 2018-07-19 NOTE — Progress Notes (Signed)
PFT completed today.  

## 2018-07-19 NOTE — Progress Notes (Signed)
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 Pulmonary Consult    Self referral. She has seen Dr. Lamonte Sakai in the past- last in 2012. She has been seeing Dr. Corrin Parker at [facility] and is here today to re-est care. She c/o DOE with cleaning her house or taking out the trash. She is on O2 with sleep at 2lpm. She uses proiar 1 x per wk on average and has a neb with albuterol that she rarely uses.   doe x [facility] = can't walk a nl pace on a flat grade s sob but does fine slow and flat eg shopping at slow pace / can't do ramps Doe Never changed since empyema despite having RT and chemo for BAC dx 09/14/09 at [facility] with [facility] 11/2009 and persistent bilateral MPN's under surveillance at [facility] but no recent bx's or further chemo.  Uses  advair 250 twice daily ? Benefit > saba doesn't really help any of her symptoms/ no signifcant chronic or assoc  Coughing. rec Plan A = Automatic = bevespi Take 2 puffs first thing in am and then another 2 puffs about 12 hours later.  Work on inhaler technique: Plan B = Backup Only use your albuterol as a rescue medication  Plan C = Crisis - only use your albuterol nebulizer if you first try Plan B and it fails to help > ok to use the nebulizer up to every 4 hours but if start needing it regularly call for immediate appointment    08/10/2017  f/u ov/Ariyanna Oien re: GOLD II COPD  Chief Complaint  Patient presents with  . Follow-up    Breathing is doing well.  She uses  her albuterol inhaler and neb rarely.    doe improved to [facility] = can walk nl pace, flat grade, can't hurry or go uphills or steps s sob   No noct symtoms   rec Continue the Bevespi Take 2 puffs first thing in am and then another 2 puffs about 12 hours later.      06/19/2018 acute extended ov/Nakeysha Pasqual re: GOLD II copd/ worse doe  Chief Complaint  Patient presents with  . Acute Visit    Increased SOB over the past several months, relates to hot weather and stress. She has been using her albuterol inhaler 2-3 x per day.   acutely worse/ persistent daily  doe with onset of really hot weather 3 months -  ok rest and ok hs 2lpm  Doe = MMRC3 = can't walk 100 yards even at a slow pace at a flat grade s stopping due to sob   Assoc with severe fatigue / need to go to bed early and go back to bed when gets up in am  No assoc cough  Started using saba again w/in 3 h of am  bevespi and feels it probably  helps when she rechallenges (better ex tol after saba)  rec No change rx    07/19/2018  f/u ov/Chamara Dyck re: copd gold II / nasal congestion main concern  Chief Complaint  Patient presents with  . Follow-up    Breathing is unchanged since last OV. PFT completed today.  Dyspnea:  MMRC2 = can't walk a nl pace on a flat grade s sob but does fine slow and flat / limited by back pain more than breathing  Cough: min in am/ attributes to pnds some better with otc's ? Not sure names  Sleeping: bed flat / on side/ 2 pllows SABA use: 2-3 x weekly 02:  2lpm hs     No obvious day to day or daytime variability or assoc excess/ purulent sputum or mucus plugs or hemoptysis or cp or chest tightness, subjective wheeze or overt   hb symptoms.   Sleeping as above  without nocturnal  or early am exacerbation  of respiratory  c/o's or need for noct saba. Also denies any obvious fluctuation of symptoms with weather or environmental changes or other aggravating or alleviating factors except as outlined above   No unusual  exposure hx or h/o childhood pna/ asthma or knowledge of premature birth.  Current Allergies, Complete Past Medical History, Past Surgical History, Family History, and Social History were reviewed in [employer] record.  ROS  The following are not active complaints unless bolded Hoarseness, sore throat, dysphagia, dental problems, itching, sneezing,  nasal congestion or discharge of excess mucus or purulent secretions, ear ache,   fever, chills, sweats, unintended wt loss or wt gain, classically pleuritic or exertional cp,  orthopnea pnd or arm/hand swelling  or leg swelling, presyncope, palpitations, abdominal pain, anorexia, nausea, vomiting, diarrhea  or change in bowel habits or change in bladder habits, change in stools or change in urine, dysuria, hematuria,  rash, arthralgias, visual complaints, headache, numbness, weakness or ataxia or problems with walking or coordination,  change in mood or  memory.        Current Meds  Medication Sig  . acetaminophen (TYLENOL) 325 MG tablet Take by mouth.  Marland Kitchen albuterol (PROAIR HFA) 108 (90 Base) MCG/ACT inhaler Inhale 1 puff into the lungs every 6 (six) hours as needed for wheezing or shortness of breath.  Marland Kitchen albuterol (PROVENTIL) (2.5 MG/3ML) 0.083% nebulizer solution USE 1 VIAL (3 ML OR 2.5 MG TOTAL) VIA NEBULIZER EVERY 4 HOURS AS NEEDED FOR WHEEZING OR SHORTNESS OF BREATH  . ALPRAZolam (XANAX) 0.5 MG tablet TAKE 1 TABLET BY MOUTH EVERY DAY AT BEDTIME AS NEEDED ANXIETY OR INSOMNIA  . amphetamine-dextroamphetamine (ADDERALL XR) 15 MG 24 hr capsule Take 1 capsule by mouth every morning.  Marland Kitchen b complex-C-folic acid 1 MG capsule Take 1 capsule by mouth daily.    Marland Kitchen buPROPion (WELLBUTRIN XL) 150 MG 24 hr tablet TAKE 1 TABLET DAILY  . Calcium Carbonate-Vit D-Min (CALCIUM 1200 PO) Take by mouth.  . celecoxib (CELEBREX) 200 MG capsule TAKE 1 CAPSULE DAILY  . Cyanocobalamin 1000 MCG CAPS Take by mouth.  . doxazosin (CARDURA) 4 MG tablet TAKE  1 TABLET DAILY  . DULoxetine (CYMBALTA) 60 MG capsule TAKE 1 CAPSULE DAILY  . fluticasone (FLONASE) 50 MCG/ACT nasal spray USE 2 SPRAYS IN EACH NOSTRIL DAILY  . folic acid (FOLVITE) 1 MG tablet Take by mouth.  . Glycopyrrolate-Formoterol (BEVESPI AEROSPHERE) 9-4.8 MCG/ACT AERO Inhale 2 puffs into the lungs 2 (two) times daily.  Marland Kitchen levothyroxine (SYNTHROID, LEVOTHROID) 100 MCG tablet TAKE 1 TABLET EVERY MORNING  . losartan-hydrochlorothiazide (HYZAAR) 100-25  MG tablet TAKE 1 TABLET DAILY  . omeprazole (PRILOSEC) 20 MG capsule TAKE 1 CAPSULE DAILY  . OXYGEN 2lpm with sleep  . pravastatin (PRAVACHOL) 40 MG tablet Take 1 tablet (40 mg total) by mouth daily. Stop simvastatin  . traZODone (DESYREL) 50 MG tablet Take 1 tablet (50 mg total) by mouth at bedtime as needed. for sleep  . VITAMIN D, CHOLECALCIFEROL, PO Take 1 tablet by mouth daily.  . [DISCONTINUED] BEVESPI AEROSPHERE 9-4.8 MCG/ACT AERO USE 2 INHALATIONS TWICE A DAY                Objective:   Physical Exam    amb wf nad   Vital signs reviewed - Note on arrival 02 sats  95% on RA    07/19/2018       173  06/19/2018       175  08/10/2017     170   06/29/17 166 lb (75.3 kg)  05/28/17 164 lb (74.4 kg)  05/03/17 166 lb (75.3 kg)      HEENT: nl dentition / oropharynx. Nl external ear canals without cough reflex -  Mod bilateral non-specific turbinate edema     NECK :  without JVD/Nodes/TM/ nl carotid upstrokes bilaterally   LUNGS: no acc muscle use,  Mild barrel  contour chest wall with bilateral  Distant bs s audible wheeze and  without cough on insp or exp maneuver and mild  Hyperresonant  to  percussion bilaterally     CV:  RRR  no s3 or murmur or increase in P2, and no edema   ABD:  soft and nontender with pos late pos  insp Hoover's  in the supine position. No bruits or organomegaly appreciated, bowel sounds nl  MS:   Nl gait/  ext warm without deformities, calf tenderness, cyanosis or clubbing No obvious joint  restrictions   SKIN: warm and dry without lesions    NEURO:  alert, approp, nl sensorium with  no motor or cerebellar deficits apparent.              Assessment:

## 2018-07-20 ENCOUNTER — Encounter: Payer: Self-pay | Admitting: Internal Medicine

## 2018-07-20 NOTE — Assessment & Plan Note (Addendum)
Quit smoking 2006 PFT's  08/07/11  FEV1 2.14 (89 % ) ratio 69  with DLCO  73 % corrects to 108 % for alv volume  - Spirometry 06/29/2017  FEV1 1.24 (52%)  Ratio 64 on advair -  Allergy profile 06/29/17  >  Eos 0.2/  IgE  13 RAST neg  - 06/29/2017  try bevespi  2bid > improved as of 08/10/18  -  Spirometry 06/19/2018  FEV1 1.4 (57%)  Ratio 64 p bevespi x 2 pffs     - PFT's  07/19/2018  FEV1 1.63  (74 % ) ratio 68  p 13 % improvement from saba p nothing prior to study with DLCO  71/70c % corrects to 93 % for alv volume    -  07/19/2018  After extensive coaching inhaler device,  effectiveness =   90%    Pt is Group B in terms of symptom/risk and laba/lama therefore appropriate rx at this point > continue bevespi 2 bid   Advised : Formulary restrictions will be an ongoing challenge for the forseable future and I would be happy to pick an alternative if the pt will first  provide me a list of them but pt  will need to return here for training for any new device that is required eg dpi vs hfa vs respimat.    In meantime we can always provide samples so the patient never runs out of any needed respiratory medications.    >>> f/u  q 6 m, sooner if needed

## 2018-07-20 NOTE — Assessment & Plan Note (Signed)
-    Allergy profile 06/29/17  >  Eos 0.2/  IgE  13 RAST neg   >>>> sinus CT  Ordered   Appears to be overusing decongestants ? Rhinitis medicamentosa > rec sinus ct and ent f/u prn

## 2018-07-20 NOTE — Assessment & Plan Note (Signed)
See care everywhere pulmonary notes from 2016/ Govert > rx 2lpm hs  - 06/19/2018  Walked RA x 3 laps @ 185 ft each stopped due to  End of study, moderately fast pace, no desat but sob at end      Adequate control on present rx, reviewed in detail with pt > no change in rx needed  = 2lpm hs only     I had an extended discussion with the patient reviewing all relevant studies completed to date and  lasting 15 to 20 minutes of a 25 minute visit    See device teaching which extended face to face time for this visit.  Each maintenance medication was reviewed in detail including emphasizing most importantly the difference between maintenance and prns and under what circumstances the prns are to be triggered using an action plan format that is not reflected in the computer generated alphabetically organized AVS which I have not found useful in most complex patients, especially with respiratory illnesses  Please see AVS for specific instructions unique to this visit that I personally wrote and verbalized to the the pt in detail and then reviewed with pt  by my nurse highlighting any  changes in therapy recommended at today's visit to their plan of care.

## 2018-07-22 ENCOUNTER — Ambulatory Visit
Admission: RE | Admit: 2018-07-22 | Discharge: 2018-07-22 | Disposition: A | Discharge status: Home / Self Care | Payer: Medicare Other | Source: Ambulatory Visit | Attending: Internal Medicine | Admitting: Internal Medicine

## 2018-07-22 DIAGNOSIS — J302 Other seasonal allergic rhinitis: Secondary | ICD-10-CM

## 2018-07-22 DIAGNOSIS — R059 Cough, unspecified: Secondary | ICD-10-CM

## 2018-07-22 DIAGNOSIS — R05 Cough: Secondary | ICD-10-CM

## 2018-07-22 DIAGNOSIS — R51 Headache: Secondary | ICD-10-CM | POA: Diagnosis not present

## 2018-07-22 NOTE — Progress Notes (Signed)
Spoke with pt and notified of results per Dr. Wert. Pt verbalized understanding and denied any questions. 

## 2018-07-23 ENCOUNTER — Encounter: Payer: Self-pay | Admitting: Family Medicine

## 2018-07-23 ENCOUNTER — Ambulatory Visit (INDEPENDENT_AMBULATORY_CARE_PROVIDER_SITE_OTHER): Payer: Medicare Other | Admitting: Family Medicine

## 2018-07-23 VITALS — BP 146/82 | HR 80 | Temp 98.2°F | Resp 20 | Ht 65.0 in | Wt 171.0 lb

## 2018-07-23 DIAGNOSIS — L989 Disorder of the skin and subcutaneous tissue, unspecified: Secondary | ICD-10-CM | POA: Diagnosis not present

## 2018-07-23 MED ORDER — MOMETASONE FUROATE 0.1 % EX CREA: 1.0000 "application " | TOPICAL_CREAM | Freq: Every day | CUTANEOUS | 0 refills | Status: DC

## 2018-07-23 MED ORDER — ALPRAZOLAM 0.5 MG PO TABS: ORAL_TABLET | ORAL | 0 refills | Status: DC

## 2018-07-23 NOTE — Progress Notes (Signed)
Subjective:    Patient ID: [name], female    DOB: 1941-07-01, 77 y.o.   MRN: [id number] 10/19 Patient presents today concerned with lesions on her right leg.  There are approximately 5 areas that were treated 5 weeks ago by her dermatologist with what sounds like liquid nitrogen cryotherapy.  Each of the lesions now is a violaceous macule approximately 1 cm in diameter.  There is a larger lesion on the anterior surface of the right shin this approximately 1.5 cm in diameter.  It has a scab in the center that is approximately 3 mm in diameter.  There is no bright red erythema.  There is hyperpigmentation consistent with a healing scar.  There is no warmth or tenderness or pain.  Patient is concerned that they may be infected or that is taking a prolonged period of time for them to heal.  There is no fluctuance.  There is no streaking erythema.  At that time, the plan was: Each of the above lesions are 1 to 1.5 cm in diameter and appear to be healing scars.  I explained that wounds on the leg take longer to heal usually particularly for a female patient especially if there is any pitting edema.  I see no evidence of secondary cellulitis or abscess formation.  The lesion on the distal anterior right calf does have a scab which makes me concerned about possible residual malignancy.  If this lesion does not completely heal, she may not require an excisional biopsy.  However the present time is only 3 mm in diameter and is difficult to ascertain if this is healing or persistent pathology.  Therefore I recommended continued tincture of time over the next 2 to 3 weeks to see if lesions will completely resolve.  If not, an excisional biopsy may be warranted of the distal shin lesion.  However the others appear completely normal and seem to be healing well  07/23/18 2 of the lesions on her lower right leg have almost essentially subsided.  All that remains is a slight pink salmon-colored macule with a  biopsy was performed.  However to the lesions continue to cause the patient problem.  They itch.  The patient constantly scratching at them.  Each of the lesions is approximately 1 cm in diameter to 1.5 cm in diameter.  In the center is an excoriated white scab where the patient continues to scratch.  There is no evidence of secondary cellulitis.  I believe the reason that they are not healing completely is because the patient continues to traumatize them with her fingernails although as stated above, residual malignancy is a concern. Past Medical History:  Diagnosis Date  . ADD (attention deficit disorder)   . Allergic rhinitis   . Anemia   . Anxiety   . Arrhythmia   . Arthritis    "hands, neck, right shoulder" (07/28/2015)  . Aseptic necrosis ([place])   . Asthma dxc'd 05/2015  . Basal cell cancer   . Bleeding stomach ulcer ~ 06/2005  . COPD (chronic obstructive pulmonary disease) ([place])   . Depression   . Diverticula of colon   . Dyspnea   . Esophagitis   . Gastric ulcer with hemorrhage   . GERD (gastroesophageal reflux disease)   . Heart murmur   . History of blood transfusion 06/2005   "related to pneumonia"  . History of kidney stones   . Hyperlipidemia   . Hypertension   . Hypothyroidism   . Kidney stones   .  Lung cancer ([place])    Non small cell lung cancer, adenocarcinoma with BAC 09/2009, VATS converted to wedge resection of left upper lobe mass  . Mitral valve prolapse    "no ORs" (07/28/2015)  . Osteopenia   . Pneumonia 1970's; 06/2005  . Sleep apnea dx'd 06/2015   on oxygen at nite at 2L/[place]   . Squamous cell skin cancer    Past Surgical History:  Procedure Laterality Date  . ABDOMINAL HYSTERECTOMY  1988  . COLONOSCOPY WITH PROPOFOL N/A 06/15/2017   Procedure: COLONOSCOPY WITH PROPOFOL;  Surgeon: Carol Ada, MD;  Location: [facility];  Service: Endoscopy;  Laterality: N/A;  . HAMMER TOE SURGERY Right ~ 2010  . SALPINGOOPHORECTOMY Left 1988  . SKIN CANCER EXCISION      "I've had severl cut off RLE; left foreqrm, nose under nose"  . THORACOTOMY Left 06/2005  . THORACOTOMY/LOBECTOMY  09/2009; 11/2009   left; right   Current Outpatient Medications on File Prior to Visit  Medication Sig Dispense Refill  . acetaminophen (TYLENOL) 325 MG tablet Take by mouth.    Marland Kitchen albuterol (PROAIR HFA) 108 (90 Base) MCG/ACT inhaler Inhale 1 puff into the lungs every 6 (six) hours as needed for wheezing or shortness of breath.    Marland Kitchen albuterol (PROVENTIL) (2.5 MG/3ML) 0.083% nebulizer solution USE 1 VIAL (3 ML OR 2.5 MG TOTAL) VIA NEBULIZER EVERY 4 HOURS AS NEEDED FOR WHEEZING OR SHORTNESS OF BREATH 450 mL 3  . amphetamine-dextroamphetamine (ADDERALL XR) 15 MG 24 hr capsule Take 1 capsule by mouth every morning. 30 capsule 0  . b complex-C-folic acid 1 MG capsule Take 1 capsule by mouth daily.      Marland Kitchen buPROPion (WELLBUTRIN XL) 150 MG 24 hr tablet TAKE 1 TABLET DAILY 90 tablet 3  . Calcium Carbonate-Vit D-Min (CALCIUM 1200 PO) Take by mouth.    . celecoxib (CELEBREX) 200 MG capsule TAKE 1 CAPSULE DAILY 90 capsule 2  . Cyanocobalamin 1000 MCG CAPS Take by mouth.    . doxazosin (CARDURA) 4 MG tablet TAKE 1 TABLET DAILY 90 tablet 4  . DULoxetine (CYMBALTA) 60 MG capsule TAKE 1 CAPSULE DAILY 90 capsule 3  . fluticasone (FLONASE) 50 MCG/ACT nasal spray USE 2 SPRAYS IN EACH NOSTRIL DAILY 48 g 3  . folic acid (FOLVITE) 1 MG tablet Take by mouth.    . Glycopyrrolate-Formoterol (BEVESPI AEROSPHERE) 9-4.8 MCG/ACT AERO Inhale 2 puffs into the lungs 2 (two) times daily. 1 Inhaler 0  . levothyroxine (SYNTHROID, LEVOTHROID) 100 MCG tablet TAKE 1 TABLET EVERY MORNING 90 tablet 4  . losartan-hydrochlorothiazide (HYZAAR) 100-25 MG tablet TAKE 1 TABLET DAILY 90 tablet 3  . omeprazole (PRILOSEC) 20 MG capsule TAKE 1 CAPSULE DAILY 90 capsule 3  . OXYGEN 2lpm with sleep    . pravastatin (PRAVACHOL) 40 MG tablet Take 1 tablet (40 mg total) by mouth daily. Stop simvastatin 90 tablet 3  . traZODone  (DESYREL) 50 MG tablet Take 1 tablet (50 mg total) by mouth at bedtime as needed. for sleep 90 tablet 1  . VITAMIN D, CHOLECALCIFEROL, PO Take 1 tablet by mouth daily.     No current facility-administered medications on file prior to visit.    Allergies  Allergen Reactions  . Doxycycline Nausea Only  . Erythromycin Nausea Only  . Sulfonamide Derivatives Hives   Social History   Socioeconomic History  . Marital status: Married    Spouse name: Not on file  . Number of children: 3  . Years of education:  Not on file  . Highest education level: Not on file  Occupational History  . Occupation: retired Optician, dispensing: RETIRED  Social Needs  . Financial resource strain: Not on file  . Food insecurity:    Worry: Not on file    Inability: Not on file  . Transportation needs:    Medical: Not on file    Non-medical: Not on file  Tobacco Use  . Smoking status: Former Smoker    Packs/day: 0.75    Years: 30.00    Pack years: 22.50    Types: Cigarettes    Last attempt to quit: 09/11/2004    Years since quitting: 13.8  . Smokeless tobacco: Never Used  Substance and Sexual Activity  . Alcohol use: Yes    Alcohol/week: 7.0 standard drinks    Types: 7 Shots of liquor per week    Comment: 1-2 drinks nightly   . Drug use: No  . Sexual activity: Yes  Lifestyle  . Physical activity:    Days per week: Not on file    Minutes per session: Not on file  . Stress: Not on file  Relationships  . Social connections:    Talks on phone: Not on file    Gets together: Not on file    Attends religious service: Not on file    Active member of club or organization: Not on file    Attends meetings of clubs or organizations: Not on file    Relationship status: Not on file  . Intimate partner violence:    Fear of current or ex partner: Not on file    Emotionally abused: Not on file    Physically abused: Not on file    Forced sexual activity: Not on file  Other Topics Concern  . Not on file    Social History Narrative  . Not on file   Family History  Problem Relation Age of Onset  . Graves' disease Daughter   . Breast cancer Maternal Aunt   . Heart disease Maternal Aunt   . Hypertension Mother   . Arthritis Maternal Grandmother      Review of Systems  All other systems reviewed and are negative.      Objective:   Physical Exam  Constitutional: She appears well-developed and well-nourished. No distress.  Cardiovascular: Normal rate, regular rhythm, normal heart sounds and intact distal pulses. Exam reveals no gallop and no friction rub.  No murmur heard. Pulmonary/Chest: Effort normal and breath sounds normal. No respiratory distress. She has no wheezes. She has no rales. She exhibits no tenderness.  Musculoskeletal: She exhibits no edema.       Right lower leg: She exhibits no tenderness, no bony tenderness, no swelling, no edema and no deformity.       Legs: Skin: She is not diaphoretic.  Vitals reviewed.   2 lesions each approximately 1.5 cm in diameter are a slight pink macule with a central white hard papule/scab that is excoriated      Assessment & Plan:  Skin lesion of right leg I will treat the itching symptomatically with Elocon cream applied once daily to the 2 lesions to keep the patient from scratching and picking at them.  I am concerned that there may be an element of a pickers nodule forming due to constant trauma.  If the patient quits scratching the lesions and they continue to persist, I would recommend a biopsy to rule out residual malignancy.  Reassess in 4 weeks  if persistent

## 2018-07-25 ENCOUNTER — Ambulatory Visit: Payer: Medicare Other | Admitting: Family Medicine

## 2018-07-31 ENCOUNTER — Ambulatory Visit: Payer: Medicare Other

## 2018-08-14 ENCOUNTER — Other Ambulatory Visit: Payer: Self-pay

## 2018-08-14 ENCOUNTER — Ambulatory Visit
Admission: RE | Admit: 2018-08-14 | Discharge: 2018-08-14 | Disposition: A | Discharge status: Home / Self Care | Payer: Medicare Other | Source: Ambulatory Visit | Attending: Family Medicine | Admitting: Family Medicine

## 2018-08-14 ENCOUNTER — Other Ambulatory Visit: Payer: Self-pay | Admitting: Family Medicine

## 2018-08-14 ENCOUNTER — Ambulatory Visit (INDEPENDENT_AMBULATORY_CARE_PROVIDER_SITE_OTHER): Payer: Medicare Other | Admitting: Family Medicine

## 2018-08-14 ENCOUNTER — Encounter: Payer: Self-pay | Admitting: Family Medicine

## 2018-08-14 VITALS — BP 138/82 | HR 66 | Temp 98.1°F | Resp 16 | Ht 65.0 in | Wt 172.0 lb

## 2018-08-14 DIAGNOSIS — S2231XA Fracture of one rib, right side, initial encounter for closed fracture: Secondary | ICD-10-CM | POA: Diagnosis not present

## 2018-08-14 DIAGNOSIS — W19XXXD Unspecified fall, subsequent encounter: Secondary | ICD-10-CM

## 2018-08-14 DIAGNOSIS — R0781 Pleurodynia: Secondary | ICD-10-CM

## 2018-08-14 DIAGNOSIS — M545 Low back pain, unspecified: Secondary | ICD-10-CM

## 2018-08-14 DIAGNOSIS — M47816 Spondylosis without myelopathy or radiculopathy, lumbar region: Secondary | ICD-10-CM | POA: Diagnosis not present

## 2018-08-14 MED ORDER — HYDROCODONE-ACETAMINOPHEN 5-325 MG PO TABS: 1.0000 | ORAL_TABLET | Freq: Four times a day (QID) | ORAL | 0 refills | Status: DC | PRN

## 2018-08-14 NOTE — Progress Notes (Signed)
   Subjective:    Patient ID: [name], female    DOB: 04-08-1941, 77 y.o.   MRN: [id number]  Patient presents for Rib Pain (S/P fall last night- fell against commode on R side)  She fell in the bathroom last night, she slipped on a rug and hit her right side on the toilet, no bruising but hears a popping sound when she moves, also has some low back pain but no radiation, no change in bowel or bladder Took naprosyn which helped some and some norco, but does not have any more    Review Of Systems:  GEN- denies fatigue, fever, weight loss,weakness, recent illness HEENT- denies eye drainage, change in vision, nasal discharge, CVS- denies chest pain, palpitations RESP- denies SOB, cough, wheeze ABD- denies N/V, change in stools, abd pain GU- denies dysuria, hematuria, dribbling, incontinence MSK- + joint pain, muscle aches, injury Neuro- denies headache, dizziness, syncope, seizure activity       Objective:    BP 138/82   Pulse 66   Temp 98.1 F (36.7 C) (Oral)   Resp 16   Ht 5\' 5"  (1.651 m)   Wt 172 lb (78 kg)   SpO2 96%   BMI 28.62 kg/m  GEN- NAD, alert and oriented x3 Neck- Supple, fair ROM CVS- RRR, no murmur RESP-CTAB Chest wall- TTP along lower right ribs from ant to post , popping sound heard with movement, no bruising, no step off MSK- mild TTP lumbar spine, decreased ROM, neg SLR  ABD-NABS,soft,NT,ND EXT- No edema Pulses- Radial  2+        Assessment & Plan:      Problem List Items Addressed This Visit    None    Visit Diagnoses    Fall, subsequent encounter    -  Primary   Relevant Orders   DG Lumbar Spine Complete (Completed)   Closed fracture of one rib of right side, initial encounter       Right 9th rib fracture on xray, lumbar spine old DDD. non displaced, heal with time, she has history of previous rib fracture, norco given. Stop celebrex as she feels the naprosyn works better for her Incentive spirometry, deep breaths to prevent  PNA   Note ostepenia on bone density from 2017 She is on calcium and D   Acute midline low back pain without sciatica       Relevant Medications   HYDROcodone-acetaminophen (NORCO) 5-325 MG tablet   Other Relevant Orders   DG Lumbar Spine Complete (Completed)      Note: This dictation was prepared with Dragon dictation along with smaller phrase technology. Any transcriptional errors that result from this process are unintentional.

## 2018-08-14 NOTE — Patient Instructions (Addendum)
Use Naprosyn twice a day  Stop the celebrex Get the xrays Take the hydrocodone for pain F/U as needed

## 2018-08-19 ENCOUNTER — Other Ambulatory Visit: Payer: Self-pay | Admitting: Family Medicine

## 2018-08-19 MED ORDER — HYDROCODONE-ACETAMINOPHEN 10-325 MG PO TABS: 1.0000 | ORAL_TABLET | Freq: Three times a day (TID) | ORAL | 0 refills | Status: DC | PRN

## 2018-09-09 ENCOUNTER — Telehealth: Payer: Self-pay | Admitting: Family Medicine

## 2018-09-09 MED ORDER — OXYCODONE-ACETAMINOPHEN 5-325 MG PO TABS: 1.0000 | ORAL_TABLET | Freq: Two times a day (BID) | ORAL | 0 refills | Status: DC | PRN

## 2018-09-09 NOTE — Telephone Encounter (Signed)
Pt called [name] stating that if possible she would like a few more pain pills. LRF- 08/19/18  If ok please send to [facility]

## 2018-09-09 NOTE — Telephone Encounter (Signed)
Changed to percocet

## 2018-09-09 NOTE — Telephone Encounter (Signed)
Spoke to patient and she states that the 5/325mg  is not helping much at all and wanted to know if we could also increase the dose?

## 2018-09-09 NOTE — Telephone Encounter (Signed)
Pt aware.

## 2018-09-13 ENCOUNTER — Other Ambulatory Visit: Payer: Self-pay | Admitting: Family Medicine

## 2018-09-13 MED ORDER — AMPHETAMINE-DEXTROAMPHET ER 15 MG PO CP24: 15.0000 mg | ORAL_CAPSULE | ORAL | 0 refills | Status: DC

## 2018-09-20 ENCOUNTER — Telehealth: Payer: Self-pay | Admitting: Family Medicine

## 2018-09-20 DIAGNOSIS — G510 Bell's palsy: Secondary | ICD-10-CM

## 2018-09-20 MED ORDER — VALACYCLOVIR HCL 500 MG PO TABS: 500.0000 mg | ORAL_TABLET | Freq: Three times a day (TID) | ORAL | 0 refills | Status: DC

## 2018-09-20 NOTE — Telephone Encounter (Signed)
Pt needs refill on medication that would heal fever blisters she doesn't remember the name of it. Please call in to [facility] [address]

## 2018-09-20 NOTE — Telephone Encounter (Signed)
Medication called/sent to requested pharmacy  

## 2018-09-24 ENCOUNTER — Other Ambulatory Visit: Payer: Self-pay | Admitting: Family Medicine

## 2018-09-24 DIAGNOSIS — E7800 Pure hypercholesterolemia, unspecified: Secondary | ICD-10-CM

## 2018-09-30 ENCOUNTER — Other Ambulatory Visit: Payer: Self-pay | Admitting: Family Medicine

## 2018-09-30 MED ORDER — ALPRAZOLAM 0.5 MG PO TABS: ORAL_TABLET | ORAL | 0 refills | Status: DC

## 2018-09-30 NOTE — Telephone Encounter (Signed)
Pt is requesting refill on Xanax   LOV:  08/14/18  LRF:   07/23/18

## 2018-10-10 IMAGING — CR DG KNEE 1-2V*R*
2 series · 2 of 2 positions shown · non-contrast
Comparison: None.

CLINICAL DATA: Medial right knee pain for 6 weeks.

EXAM:
RIGHT KNEE - 1-2 VIEW

[w knee ap right]
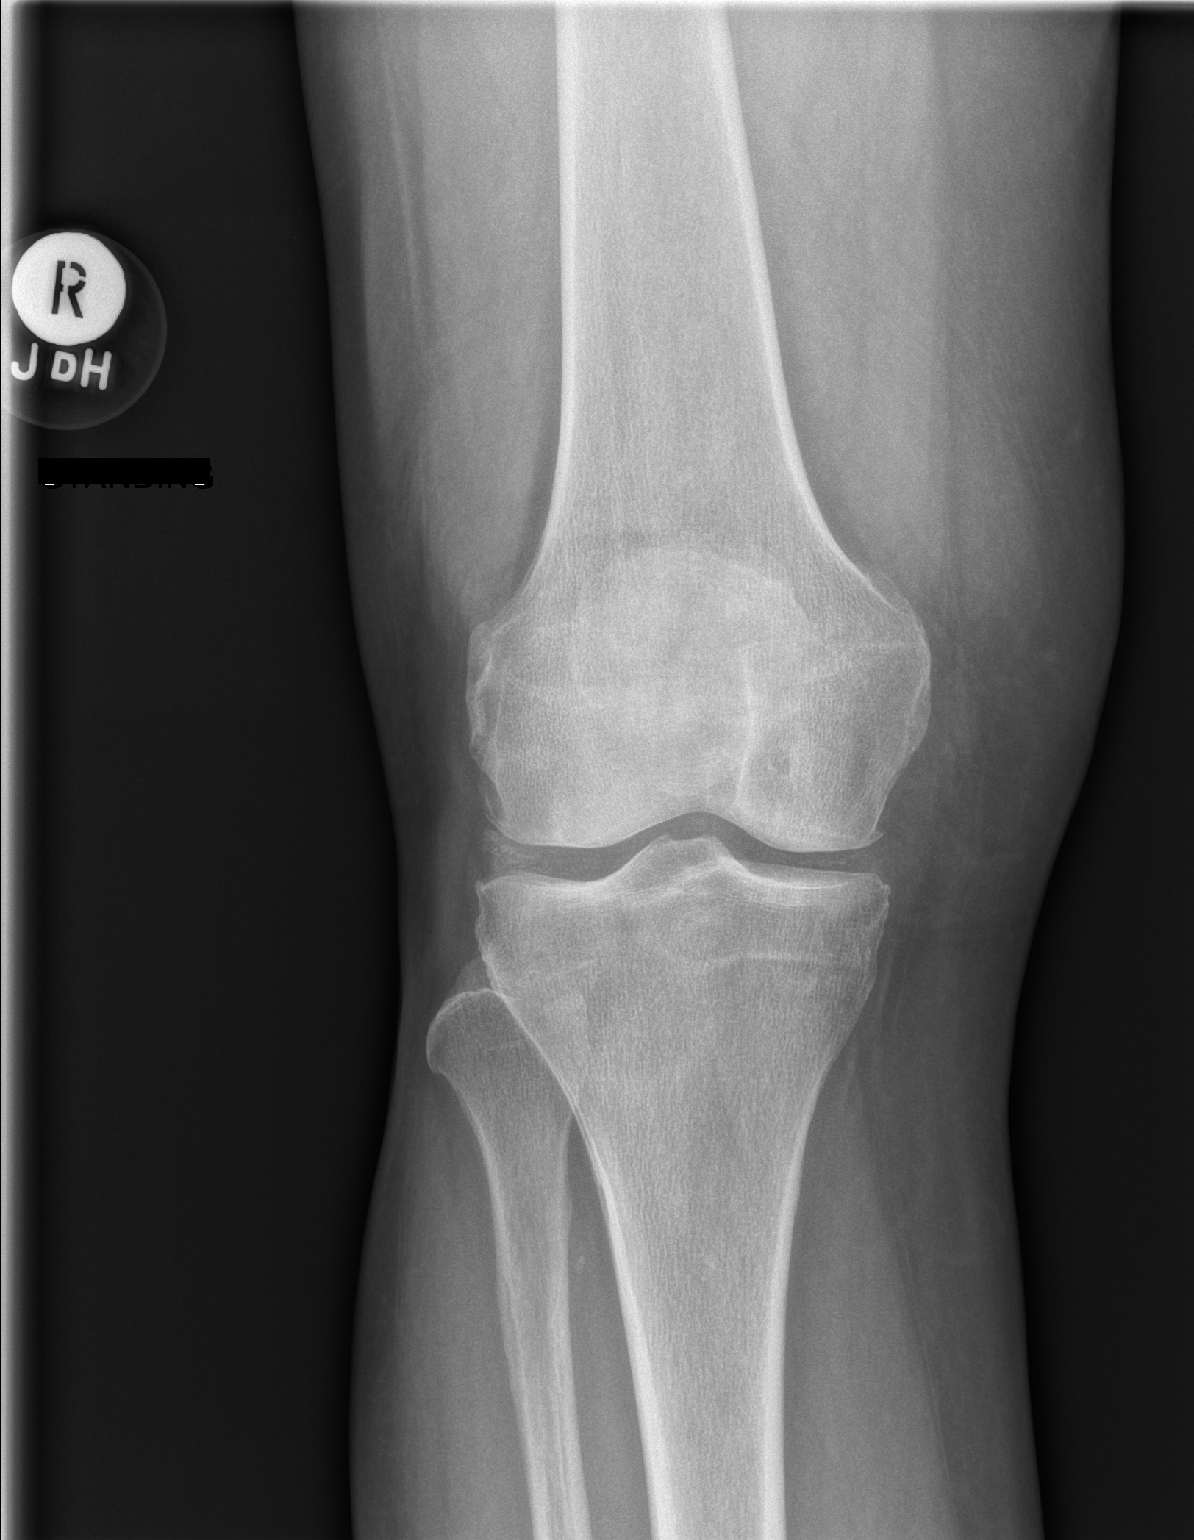

[w knee lat right]
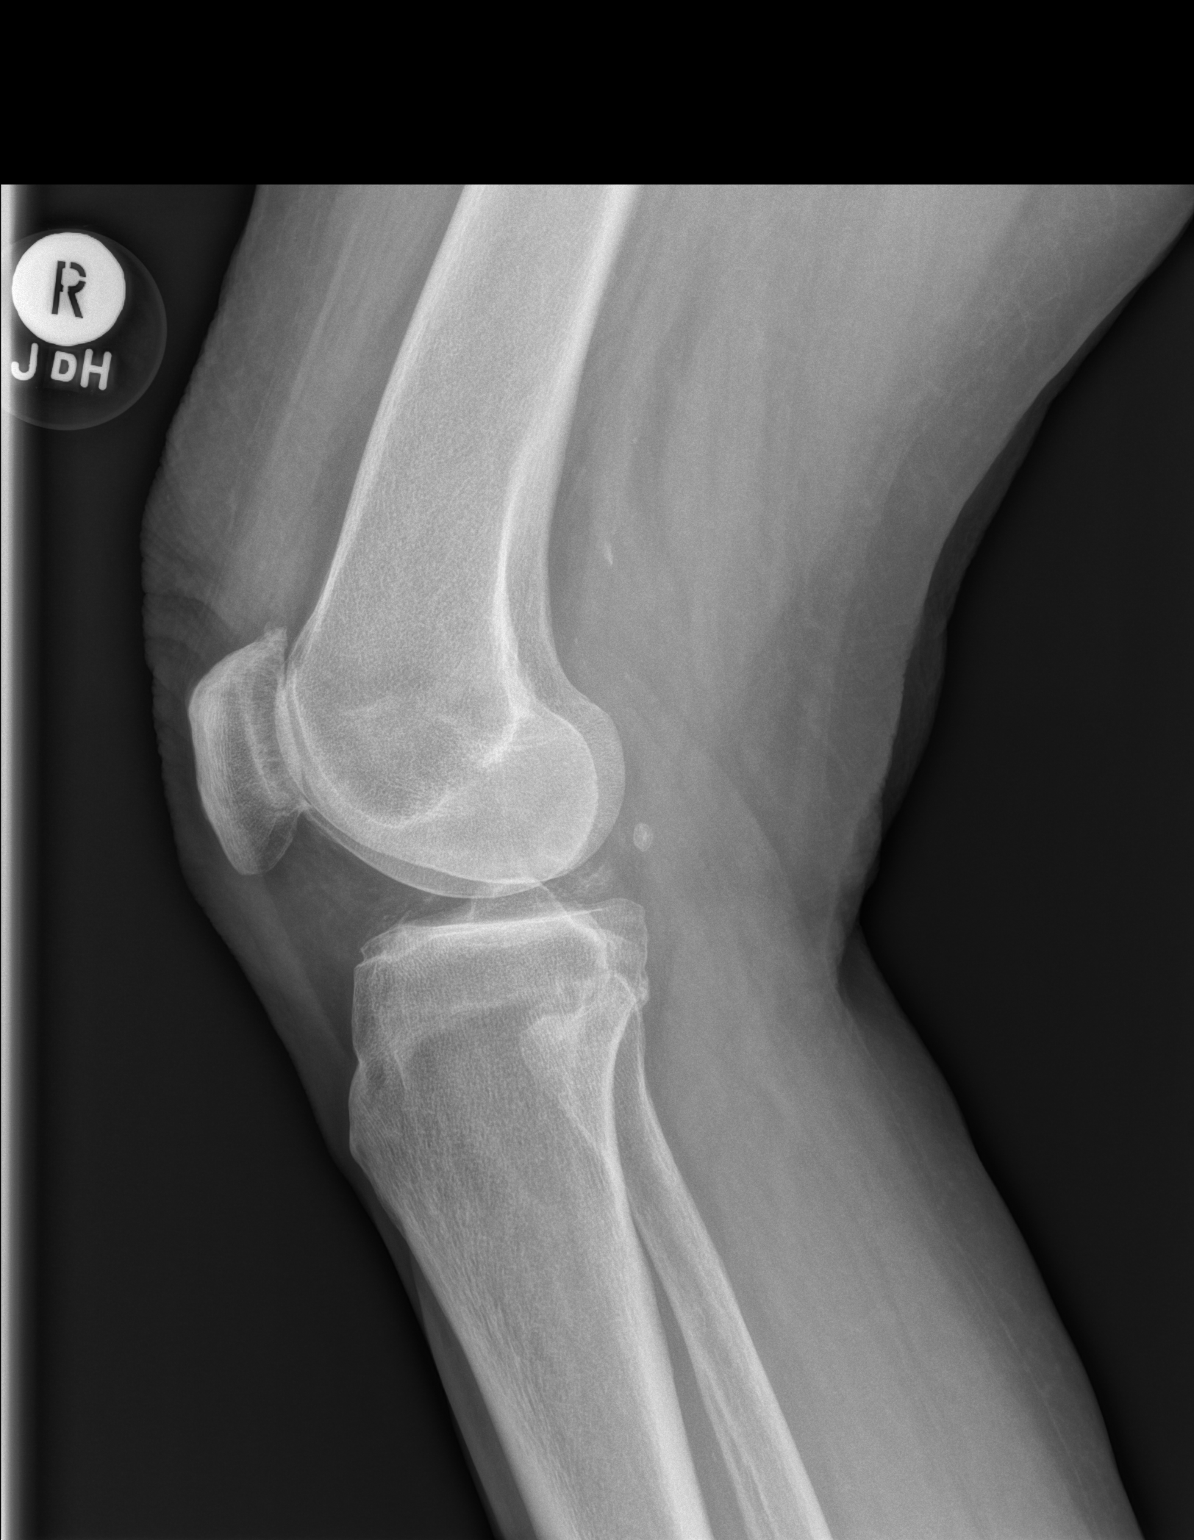

[2 of 2 positions shown; findings below may reference images not displayed]

FINDINGS: No evidence of fracture, dislocation, or joint effusion. Linear
wispy calcifications seen within the medial and lateral compartment
of the knee joint.

No significant soft tissue swelling.

Vascular calcifications noted.
IMPRESSION: Calcifications within the medial and lateral compartment of the knee
joint, usually associated with CPPD arthropathy.

No evidence of fracture or dislocation.

## 2018-10-17 ENCOUNTER — Other Ambulatory Visit: Payer: Self-pay | Admitting: Internal Medicine

## 2018-10-29 ENCOUNTER — Other Ambulatory Visit: Payer: Self-pay | Admitting: Family Medicine

## 2018-11-12 ENCOUNTER — Ambulatory Visit (INDEPENDENT_AMBULATORY_CARE_PROVIDER_SITE_OTHER): Payer: Medicare Other | Admitting: Family Medicine

## 2018-11-12 ENCOUNTER — Encounter: Payer: Self-pay | Admitting: Family Medicine

## 2018-11-12 ENCOUNTER — Telehealth: Payer: Self-pay | Admitting: Family Medicine

## 2018-11-12 ENCOUNTER — Other Ambulatory Visit: Payer: Self-pay

## 2018-11-12 VITALS — BP 124/80 | HR 89 | Temp 98.3°F | Resp 17 | Ht 65.0 in | Wt 175.1 lb

## 2018-11-12 DIAGNOSIS — R059 Cough, unspecified: Secondary | ICD-10-CM

## 2018-11-12 DIAGNOSIS — J441 Chronic obstructive pulmonary disease with (acute) exacerbation: Secondary | ICD-10-CM

## 2018-11-12 DIAGNOSIS — R05 Cough: Secondary | ICD-10-CM | POA: Diagnosis not present

## 2018-11-12 MED ORDER — PREDNISONE 20 MG PO TABS: 40.0000 mg | ORAL_TABLET | Freq: Every day | ORAL | 0 refills | Status: AC

## 2018-11-12 MED ORDER — LEVOFLOXACIN 500 MG PO TABS: 500.0000 mg | ORAL_TABLET | Freq: Every day | ORAL | 0 refills | Status: DC

## 2018-11-12 MED ORDER — DOXAZOSIN MESYLATE 4 MG PO TABS: 4.0000 mg | ORAL_TABLET | Freq: Every day | ORAL | 4 refills | Status: DC

## 2018-11-12 NOTE — Progress Notes (Signed)
Patient ID: [name], female    DOB: 09/22/1940, 78 y.o.   MRN: [id number]  PCP: Susy Frizzle, MD  Chief Complaint  Patient presents with  . Nasal Congestion    Has c/o cough, nasal congestion, fever. Had recent travel to [place]    Subjective:   [name] is a 78 y.o. female, presents to clinic with CC of illness with URI sx, cough fever for almost 2 weeks.  She was traveling out of the country - [place], was there beginning of February returned early about 2 weeks ago.  Believes she had UTI while there, felt run down, she got antibiotics in [place], but when traveling home on the flight on the 22nd of Feb, she felt much more ill with sleepiness, fatigue with her travel via flight - [place] to [place] then to [place].  She developed nasal drainage, congestion, and subjective fever, with sweats, chills.   Getting ready this am just showering and trying to do her hair makes her extremely tired.  She can only walk a short distance before having to stop due to SOB. On inhalers - but not using her maintenance inhaler or rescue very much. She has held her BP meds too.     Patient Active Problem List   Diagnosis Date Noted  . Chronic respiratory failure with hypoxia (HCC)/ nocturnal hypoxemia complicated by polycythemia 07/01/2017  . Dyspnea on exertion 06/29/2017  . ADD (attention deficit disorder)   . Multiple pulmonary nodules 05/08/2017  . Palliative care encounter   . Tobacco abuse 07/29/2015  . Anxiety state 07/29/2015  . COPD exacerbation ([place]) 07/28/2015  . Acute respiratory failure with hypoxia ([place]) 07/28/2015  . Polycythemia 07/28/2015  . Nocturnal hypoxia 07/26/2015  . Encounter for follow-up surveillance of lung cancer 09/29/2014  . Atypical chest pain 12/01/2013  . Other malaise and fatigue 02/15/2013  . Hypothyroidism 02/15/2013  . Stye 02/15/2013  . Neck pain 02/15/2013  . Depression 03/26/2012  . Hypertension  03/26/2012  . Peptic ulcer disease 03/26/2012  . Lung cancer ([place]) 08/31/2011  . PREMATURE VENTRICULAR CONTRACTIONS 07/03/2007  . Allergic rhinitis due to allergen 07/03/2007  . COPD GOLD II 07/03/2007  . EMPYEMA 07/03/2007  . GASTROINTESTINAL HEMORRHAGE 07/03/2007  . MITRAL VALVE PROLAPSE, HX OF 07/03/2007  . OOPHORECTOMY, LEFT, HX OF 07/03/2007     Prior to Admission medications   Medication Sig Start Date End Date Taking? Authorizing Provider  acetaminophen (TYLENOL) 325 MG tablet Take by mouth.   Yes [provider]  albuterol (PROAIR HFA) 108 (90 Base) MCG/ACT inhaler Inhale 1 puff into the lungs every 6 (six) hours as needed for wheezing or shortness of breath.   Yes [provider]  albuterol (PROVENTIL) (2.5 MG/3ML) 0.083% nebulizer solution USE 1 VIAL (3 ML OR 2.5 MG TOTAL) VIA NEBULIZER EVERY 4 HOURS AS NEEDED FOR WHEEZING OR SHORTNESS OF BREATH 01/16/17  Yes Susy Frizzle, MD  ALPRAZolam Duanne Moron) 0.5 MG tablet TAKE 1 TABLET BY MOUTH EVERY DAY AT BEDTIME AS NEEDED ANXIETY OR INSOMNIA 09/30/18  Yes Susy Frizzle, MD  amphetamine-dextroamphetamine (ADDERALL XR) 15 MG 24 hr capsule Take 1 capsule by mouth every morning. 09/13/18  Yes Susy Frizzle, MD  b complex-C-folic acid 1 MG capsule Take 1 capsule by mouth daily.     Yes [provider]  BEVESPI AEROSPHERE 9-4.8 MCG/ACT AERO USE 2 INHALATIONS TWICE A DAY 10/17/18  Yes Tanda Rockers, MD  buPROPion (WELLBUTRIN XL) 150 MG 24 hr tablet TAKE 1 TABLET DAILY 01/01/18  Yes Susy Frizzle, MD  Calcium Carbonate-Vit D-Min (CALCIUM 1200 PO) Take by mouth.   Yes [provider]  Cyanocobalamin 1000 MCG CAPS Take by mouth.   Yes [provider]  doxazosin (CARDURA) 4 MG tablet TAKE 1 TABLET DAILY 06/18/18  Yes Susy Frizzle, MD  DULoxetine (CYMBALTA) 60 MG capsule TAKE 1 CAPSULE DAILY 11/19/17  Yes Susy Frizzle, MD  fluticasone [facility]) 50 MCG/ACT nasal spray USE 2 SPRAYS IN EACH  NOSTRIL DAILY 10/29/18  Yes Susy Frizzle, MD  folic acid (FOLVITE) 1 MG tablet Take by mouth.   Yes [provider]  levothyroxine (SYNTHROID, LEVOTHROID) 100 MCG tablet TAKE 1 TABLET EVERY MORNING 06/26/18  Yes Susy Frizzle, MD  losartan-hydrochlorothiazide (HYZAAR) 100-25 MG tablet TAKE 1 TABLET DAILY 10/29/18  Yes Susy Frizzle, MD  mometasone (ELOCON) 0.1 % cream Apply 1 application topically daily. 07/23/18  Yes Susy Frizzle, MD  omeprazole (PRILOSEC) 20 MG capsule TAKE 1 CAPSULE DAILY 10/01/17  Yes Susy Frizzle, MD  oxyCODONE-acetaminophen (PERCOCET) 5-325 MG tablet Take 1 tablet by mouth 2 (two) times daily as needed for severe pain. 09/09/18  Yes [place], Modena Nunnery, MD  OXYGEN 2lpm with sleep   Yes [provider]  pravastatin (PRAVACHOL) 40 MG tablet TAKE 1 TABLET DAILY (STOP SIMVASTATIN) 09/24/18  Yes Susy Frizzle, MD  traZODone (DESYREL) 50 MG tablet Take 1 tablet (50 mg total) by mouth at bedtime as needed. for sleep 05/30/18  Yes Susy Frizzle, MD  valACYclovir (VALTREX) 500 MG tablet Take 1 tablet (500 mg total) by mouth 3 (three) times daily. 09/20/18  Yes Susy Frizzle, MD  VITAMIN D, CHOLECALCIFEROL, PO Take 1 tablet by mouth daily.   Yes [provider]     Allergies  Allergen Reactions  . Doxycycline Nausea Only  . Erythromycin Nausea Only  . Sulfonamide Derivatives Hives     Family History  Problem Relation Age of Onset  . Graves' disease Daughter   . Breast cancer Maternal Aunt   . Heart disease Maternal Aunt   . Hypertension Mother   . Arthritis Maternal Grandmother      Social History   Socioeconomic History  . Marital status: Married    Spouse name: Not on file  . Number of children: 3  . Years of education: Not on file  . Highest education level: Not on file  Occupational History  . Occupation: retired Optician, dispensing: RETIRED  Social Needs  . Financial resource strain: Not on file  .  Food insecurity:    Worry: Not on file    Inability: Not on file  . Transportation needs:    Medical: Not on file    Non-medical: Not on file  Tobacco Use  . Smoking status: Former Smoker    Packs/day: 0.75    Years: 30.00    Pack years: 22.50    Types: Cigarettes    Last attempt to quit: 09/11/2004    Years since quitting: 14.1  . Smokeless tobacco: Never Used  Substance and Sexual Activity  . Alcohol use: Yes    Alcohol/week: 7.0 standard drinks    Types: 7 Shots of liquor per week    Comment: 1-2 drinks nightly   . Drug use: No  . Sexual activity: Yes  Lifestyle  . Physical activity:    Days per week: Not on  file    Minutes per session: Not on file  . Stress: Not on file  Relationships  . Social connections:    Talks on phone: Not on file    Gets together: Not on file    Attends religious service: Not on file    Active member of club or organization: Not on file    Attends meetings of clubs or organizations: Not on file    Relationship status: Not on file  . Intimate partner violence:    Fear of current or ex partner: Not on file    Emotionally abused: Not on file    Physically abused: Not on file    Forced sexual activity: Not on file  Other Topics Concern  . Not on file  Social History Narrative  . Not on file     Review of Systems  Constitutional: Negative.   HENT: Negative.   Eyes: Negative.   Respiratory: Negative.   Cardiovascular: Negative.   Gastrointestinal: Negative.   Endocrine: Negative.   Genitourinary: Negative.   Musculoskeletal: Negative.   Skin: Negative.   Allergic/Immunologic: Negative.   Neurological: Negative.   Hematological: Negative.   Psychiatric/Behavioral: Negative.   All other systems reviewed and are negative.      Objective:    Vitals:   11/12/18 0955  BP: 124/80  Pulse: 89  Resp: 17  Temp: 98.3 F (36.8 C)  TempSrc: Oral  SpO2: 98%  Weight: 175 lb 2 oz (79.4 kg)  Height: 5\' 5"  (1.651 m)      Physical  Exam Vitals signs and nursing note reviewed.  Constitutional:      General: She is not in acute distress.    Appearance: She is well-developed. She is diaphoretic. She is not ill-appearing or toxic-appearing.  HENT:     Head: Normocephalic and atraumatic.     Right Ear: Hearing, tympanic membrane, ear canal and external ear normal.     Left Ear: Hearing, tympanic membrane, ear canal and external ear normal.     Nose: Mucosal edema, congestion and rhinorrhea present.     Right Sinus: No maxillary sinus tenderness or frontal sinus tenderness.     Left Sinus: No maxillary sinus tenderness or frontal sinus tenderness.     Mouth/Throat:     Mouth: Mucous membranes are not pale and dry.     Pharynx: Uvula midline. No oropharyngeal exudate or uvula swelling.     Tonsils: No tonsillar abscesses.  Eyes:     General:        Right eye: No discharge.        Left eye: No discharge.     Conjunctiva/sclera: Conjunctivae normal.     Pupils: Pupils are equal, round, and reactive to light.  Neck:     Musculoskeletal: Normal range of motion and neck supple.     Trachea: No tracheal deviation.  Cardiovascular:     Rate and Rhythm: Normal rate and regular rhythm.     Pulses: Normal pulses.     Heart sounds: Normal heart sounds. No murmur. No friction rub. No gallop.   Pulmonary:     Effort: Pulmonary effort is normal. No respiratory distress.     Breath sounds: No stridor. No wheezing, rhonchi or rales.  Abdominal:     General: Bowel sounds are normal. There is no distension.     Palpations: Abdomen is soft.     Tenderness: There is no abdominal tenderness. There is no guarding.  Musculoskeletal: Normal range of motion.  Right lower leg: No edema.     Left lower leg: No edema.  Lymphadenopathy:     Cervical: No cervical adenopathy.  Skin:    General: Skin is warm.     Capillary Refill: Capillary refill takes less than 2 seconds.     Coloration: Skin is not pale.     Findings: No rash.    Neurological:     Mental Status: She is alert.     Motor: No abnormal muscle tone.     Coordination: Coordination normal.  Psychiatric:        Behavior: Behavior normal.      Ambulatory pulse ox in clinic with 2 laps - SpO2 >92%, HR 88-102, tachypneic with second lap, no respiratory distress     Assessment & Plan:      ICD-10-CM   1. Acute exacerbation of chronic obstructive pulmonary disease (COPD) (HCC) J44.1 predniSONE (DELTASONE) 20 MG tablet    levofloxacin (LEVAQUIN) 500 MG tablet  2. Cough R05 DG Chest 2 View    AECOPD - tx with steroids, levaquin, nebs, get back on maintenance inhaler daily, f/up with PCP for recheck on Friday.  Any worsening f/up sooner or go to the ER.  Levaquin due to PMHx, recent travel with unknown abx use in [place], to cover sinusitis, lungs, uti.    Patient does appear mild to moderately ill but she is not hypoxic and has no respiratory distress - Close follow up.   Delsa Grana, PA-C 11/12/18 10:18 AM

## 2018-11-12 NOTE — Telephone Encounter (Signed)
Medication called/sent to requested pharmacy  

## 2018-11-12 NOTE — Telephone Encounter (Signed)
Refill on doxazosin to [facility] [address]  Please [facility] they will no longer be using mail order.

## 2018-11-15 ENCOUNTER — Encounter: Payer: Self-pay | Admitting: Family Medicine

## 2018-11-15 ENCOUNTER — Ambulatory Visit (INDEPENDENT_AMBULATORY_CARE_PROVIDER_SITE_OTHER): Payer: Medicare Other | Admitting: Family Medicine

## 2018-11-15 VITALS — BP 150/80 | HR 99 | Temp 97.8°F | Resp 20 | Ht 65.0 in | Wt 176.0 lb

## 2018-11-15 DIAGNOSIS — F900 Attention-deficit hyperactivity disorder, predominantly inattentive type: Secondary | ICD-10-CM | POA: Diagnosis not present

## 2018-11-15 DIAGNOSIS — J449 Chronic obstructive pulmonary disease, unspecified: Secondary | ICD-10-CM

## 2018-11-15 DIAGNOSIS — I10 Essential (primary) hypertension: Secondary | ICD-10-CM | POA: Diagnosis not present

## 2018-11-15 DIAGNOSIS — E7800 Pure hypercholesterolemia, unspecified: Secondary | ICD-10-CM | POA: Diagnosis not present

## 2018-11-15 DIAGNOSIS — E038 Other specified hypothyroidism: Secondary | ICD-10-CM

## 2018-11-15 NOTE — Progress Notes (Signed)
Subjective:    Patient ID: [name], female    DOB: 06/15/1941, 78 y.o.   MRN: [id number]  Patient was recently seen by my partner for COPD exacerbation and started on prednisone and Levaquin.  Her breathing sounds much better today.  Her lungs are clear.  There is no expiratory wheezing.  There is no rhonchi or rails.  Her sinuses have also improved.  However she still feels extremely tired and weak.  She is slowly improving.  But I believe her weakness is more from stress.  She feels overwhelmed caring for her husband who is battling memory loss.  He shows very little insight into his memory loss.  He believes that he can go back to be in air traffic controller.  He wants to go back to college.  He has grandiose ideas about what he is able to do however patient has mild to moderate dementia and is unable to accomplish these things.  He is constantly wanting to either go back to work or go exercise or go places or travel and the patient feels too weak and too tired to accommodate his desires.  She also feels too weak and too tired to care for the home.  Basically I believe she feels overwhelmed.  Unfortunately I do not believe is a medication issue is much is just a situational issue.  She is battling COPD, her own failing health, her husband's failing health, his worsening dementia and insight.  Past Medical History:  Diagnosis Date  . ADD (attention deficit disorder)   . Allergic rhinitis   . Anemia   . Anxiety   . Arrhythmia   . Arthritis    "hands, neck, right shoulder" (07/28/2015)  . Aseptic necrosis ([place])   . Asthma dxc'd 05/2015  . Basal cell cancer   . Bleeding stomach ulcer ~ 06/2005  . COPD (chronic obstructive pulmonary disease) ([place])   . Depression   . Diverticula of colon   . Dyspnea   . Esophagitis   . Gastric ulcer with hemorrhage   . GERD (gastroesophageal reflux disease)   . Heart murmur   . History of blood transfusion 06/2005   "related to pneumonia"  .  History of kidney stones   . Hyperlipidemia   . Hypertension   . Hypothyroidism   . Kidney stones   . Lung cancer ([place])    Non small cell lung cancer, adenocarcinoma with BAC 09/2009, VATS converted to wedge resection of left upper lobe mass  . Mitral valve prolapse    "no ORs" (07/28/2015)  . Osteopenia   . Pneumonia 1970's; 06/2005  . Sleep apnea dx'd 06/2015   on oxygen at nite at 2L/   . Squamous cell skin cancer    Past Surgical History:  Procedure Laterality Date  . ABDOMINAL HYSTERECTOMY  1988  . COLONOSCOPY WITH PROPOFOL N/A 06/15/2017   Procedure: COLONOSCOPY WITH PROPOFOL;  Surgeon: Carol Ada, MD;  Location: [facility];  Service: Endoscopy;  Laterality: N/A;  . HAMMER TOE SURGERY Right ~ 2010  . SALPINGOOPHORECTOMY Left 1988  . SKIN CANCER EXCISION     "I've had severl cut off RLE; left foreqrm, nose under nose"  . THORACOTOMY Left 06/2005  . THORACOTOMY/LOBECTOMY  09/2009; 11/2009   left; right   Current Outpatient Medications on File Prior to Visit  Medication Sig Dispense Refill  . acetaminophen (TYLENOL) 325 MG tablet Take by mouth.    Marland Kitchen albuterol (PROAIR HFA) 108 (90 Base) MCG/ACT inhaler Inhale  1 puff into the lungs every 6 (six) hours as needed for wheezing or shortness of breath.    Marland Kitchen albuterol (PROVENTIL) (2.5 MG/3ML) 0.083% nebulizer solution USE 1 VIAL (3 ML OR 2.5 MG TOTAL) VIA NEBULIZER EVERY 4 HOURS AS NEEDED FOR WHEEZING OR SHORTNESS OF BREATH 450 mL 3  . ALPRAZolam (XANAX) 0.5 MG tablet TAKE 1 TABLET BY MOUTH EVERY DAY AT BEDTIME AS NEEDED ANXIETY OR INSOMNIA 90 tablet 0  . amphetamine-dextroamphetamine (ADDERALL XR) 15 MG 24 hr capsule Take 1 capsule by mouth every morning. 30 capsule 0  . b complex-C-folic acid 1 MG capsule Take 1 capsule by mouth daily.      Marland Kitchen BEVESPI AEROSPHERE 9-4.8 MCG/ACT AERO USE 2 INHALATIONS TWICE A DAY 32.1 g 3  . buPROPion (WELLBUTRIN XL) 150 MG 24 hr tablet TAKE 1 TABLET DAILY 90 tablet 3  . Calcium Carbonate-Vit D-Min  (CALCIUM 1200 PO) Take by mouth.    . Cyanocobalamin 1000 MCG CAPS Take by mouth.    . doxazosin (CARDURA) 4 MG tablet Take 1 tablet (4 mg total) by mouth daily. 90 tablet 4  . DULoxetine (CYMBALTA) 60 MG capsule TAKE 1 CAPSULE DAILY 90 capsule 3  . fluticasone (FLONASE) 50 MCG/ACT nasal spray USE 2 SPRAYS IN EACH NOSTRIL DAILY 48 g 4  . folic acid (FOLVITE) 1 MG tablet Take by mouth.    . levofloxacin (LEVAQUIN) 500 MG tablet Take 1 tablet (500 mg total) by mouth daily. 7 tablet 0  . levothyroxine (SYNTHROID, LEVOTHROID) 100 MCG tablet TAKE 1 TABLET EVERY MORNING 90 tablet 4  . losartan-hydrochlorothiazide (HYZAAR) 100-25 MG tablet TAKE 1 TABLET DAILY 90 tablet 0  . mometasone (ELOCON) 0.1 % cream Apply 1 application topically daily. 45 g 0  . omeprazole (PRILOSEC) 20 MG capsule TAKE 1 CAPSULE DAILY 90 capsule 3  . oxyCODONE-acetaminophen (PERCOCET) 5-325 MG tablet Take 1 tablet by mouth 2 (two) times daily as needed for severe pain. 30 tablet 0  . OXYGEN 2lpm with sleep    . pravastatin (PRAVACHOL) 40 MG tablet TAKE 1 TABLET DAILY (STOP SIMVASTATIN) 90 tablet 4  . predniSONE (DELTASONE) 20 MG tablet Take 2 tablets (40 mg total) by mouth daily with breakfast for 5 days. 10 tablet 0  . traZODone (DESYREL) 50 MG tablet Take 1 tablet (50 mg total) by mouth at bedtime as needed. for sleep 90 tablet 1  . valACYclovir (VALTREX) 500 MG tablet Take 1 tablet (500 mg total) by mouth 3 (three) times daily. 21 tablet 0  . VITAMIN D, CHOLECALCIFEROL, PO Take 1 tablet by mouth daily.     No current facility-administered medications on file prior to visit.    Allergies  Allergen Reactions  . Doxycycline Nausea Only  . Erythromycin Nausea Only  . Sulfonamide Derivatives Hives   Social History   Socioeconomic History  . Marital status: Married    Spouse name: Not on file  . Number of children: 3  . Years of education: Not on file  . Highest education level: Not on file  Occupational History  .  Occupation: retired Optician, dispensing: RETIRED  Social Needs  . Financial resource strain: Not on file  . Food insecurity:    Worry: Not on file    Inability: Not on file  . Transportation needs:    Medical: Not on file    Non-medical: Not on file  Tobacco Use  . Smoking status: Former Smoker    Packs/day: 0.75  Years: 30.00    Pack years: 22.50    Types: Cigarettes    Last attempt to quit: 09/11/2004    Years since quitting: 14.1  . Smokeless tobacco: Never Used  Substance and Sexual Activity  . Alcohol use: Yes    Alcohol/week: 7.0 standard drinks    Types: 7 Shots of liquor per week    Comment: 1-2 drinks nightly   . Drug use: No  . Sexual activity: Yes  Lifestyle  . Physical activity:    Days per week: Not on file    Minutes per session: Not on file  . Stress: Not on file  Relationships  . Social connections:    Talks on phone: Not on file    Gets together: Not on file    Attends religious service: Not on file    Active member of club or organization: Not on file    Attends meetings of clubs or organizations: Not on file    Relationship status: Not on file  . Intimate partner violence:    Fear of current or ex partner: Not on file    Emotionally abused: Not on file    Physically abused: Not on file    Forced sexual activity: Not on file  Other Topics Concern  . Not on file  Social History Narrative  . Not on file   Family History  Problem Relation Age of Onset  . Graves' disease Daughter   . Breast cancer Maternal Aunt   . Heart disease Maternal Aunt   . Hypertension Mother   . Arthritis Maternal Grandmother      Review of Systems  Gastrointestinal: Positive for abdominal pain.  All other systems reviewed and are negative.      Objective:   Physical Exam  Constitutional: She appears well-developed and well-nourished. No distress.  Neck: No JVD present.  Cardiovascular: Normal rate, regular rhythm, normal heart sounds and intact distal pulses.  Exam reveals no gallop and no friction rub.  No murmur heard. Pulmonary/Chest: Effort normal and breath sounds normal. No respiratory distress. She has no wheezes. She has no rales. She exhibits no mass, no tenderness, no bony tenderness, no edema, no deformity and no swelling. Right breast exhibits no inverted nipple, no mass, no nipple discharge, no skin change and no tenderness. Left breast exhibits no inverted nipple, no mass, no nipple discharge, no skin change and no tenderness. Breasts are symmetrical.  Abdominal: Soft. Bowel sounds are normal. She exhibits no distension and no mass. There is no abdominal tenderness. There is no rebound and no guarding.  Musculoskeletal:        General: No edema.  Skin: She is not diaphoretic.  Vitals reviewed.         Assessment & Plan:  Benign essential HTN  Pure hypercholesterolemia - Plan: CBC with Differential/Platelet, COMPLETE METABOLIC PANEL WITH GFR, Lipid panel  Attention deficit hyperactivity disorder (ADHD), predominantly inattentive type  Other specified hypothyroidism - Plan: TSH  COPD (chronic obstructive pulmonary disease) with chronic bronchitis ([place])  From a medical standpoint, her COPD exacerbation has improved dramatically.  I would like her to return fasting for CBC, CMP, lipid panel as well as a TSH as we monitor her lab work every 6 months and her last evaluation was in September.  However the biggest issue for this patient I believe is situational.  She is battling her own health concerns, her own depression, her own ADD, her own COPD.  She is also battling and struggling  with her husband's worsening dementia.  She is struggling with responsibilities of caring for her home.  I believe all this has her feeling overwhelmed.  I encouraged the patient to look into [facility].  I believe that they would benefit from a retirement community.  An apartment at [facility] would remove the responsibilities of caring and maintenance for  her home from her shoulders.  I believe the community living situation would provide friendships and a sense of community that the patient and her husband would benefit dramatically from.  I believe it would give him access to a gym another man his age with which he can interact and find a sense of purpose something to look forward to.  I believe it would also remove responsibility from her shoulders from having to do everything to care for him.  I believe this would be the best situation for both of them.

## 2018-11-19 ENCOUNTER — Encounter: Payer: Self-pay | Admitting: Family Medicine

## 2018-11-20 ENCOUNTER — Other Ambulatory Visit: Payer: Self-pay

## 2018-11-20 ENCOUNTER — Ambulatory Visit (INDEPENDENT_AMBULATORY_CARE_PROVIDER_SITE_OTHER): Payer: Medicare Other | Admitting: Family Medicine

## 2018-11-20 ENCOUNTER — Encounter: Payer: Self-pay | Admitting: Family Medicine

## 2018-11-20 VITALS — BP 110/68 | HR 82 | Temp 98.8°F | Resp 16 | Ht 65.0 in | Wt 175.4 lb

## 2018-11-20 DIAGNOSIS — S91109A Unspecified open wound of unspecified toe(s) without damage to nail, initial encounter: Secondary | ICD-10-CM

## 2018-11-20 MED ORDER — CEPHALEXIN 500 MG PO CAPS: 500.0000 mg | ORAL_CAPSULE | Freq: Four times a day (QID) | ORAL | 0 refills | Status: AC

## 2018-11-20 NOTE — Patient Instructions (Addendum)
How to Change Your Dressing A dressing is a material that is placed in and over wounds. A dressing helps your wound to heal by protecting it from:  Bacteria.  Worse injury.  Being too dry or too wet. What are the risks? The sticky (adhesive) tape that is used with a dressing may make your skin sore or irritated, or it may cause a rash. These are the most common problems. However, more serious problems can develop, such as:  Bleeding.6  Infection. How to change your dressing Getting Ready to Change Your Dressing   Take a shower before you do the first dressing change of the day. If your doctor does not want your wound to get wet and your dressing is not waterproof, you may need to put plastic leak-proof sealing wrap on your dressing to protect it.  If needed, take pain medicine as told by your doctor 30 minutes before you change your dressing.  Set up a clean station for wound care. You will need: ? A plastic trash bag that is open and ready to use. ? Hand sanitizer. ? Wound cleanser or salt-water solution (saline) as told by your doctor. ? New dressing material or bandages. Make sure to open the dressing package so the dressing stays on the inside of the package. You may also need these supplies in your clean station:  A box of vinyl gloves.  Tape.  Skin protectant. This may be a wipe, film, or spray.  Clean or germ-free (sterile) scissors.  A cotton-tipped applicator.  Taking Off Your Old Dressing  Wash your hands with soap and water. Dry your hands with a clean towel. If you cannot use soap and water, use hand sanitizer.  If you are using gloves, put on the gloves before you take off the dressing.  Gently take off any adhesive or tape by pulling it off in the direction of your hair growth. Only touch the outside edges of the dressing.  Take off the dressing. If the dressing sticks to your skin, wet the dressing with a germ-free salt-water solution. This helps it come  off more easily.  Take off any gauze or packing in your wound.  Throw the old dressing supplies into the ready trash bag.  Take off your gloves. To take off each glove, grab the cuff with your other hand and turn the glove inside out. Put the gloves in the trash right away.  Wash your hands with soap and water. Dry your hands with a clean towel. If you cannot use soap and water, use hand sanitizer. Cleaning Your Wound  Follow instructions from your doctor about how to clean your wound. This may include using a salt-water solution or recommended wound cleanser.  Do not use over-the-counter medicated or antiseptic creams, sprays, liquids, or dressings unless your doctor tells you to do that.  Use a clean gauze pad to clean the area fully with the salt-water solution or wound cleanser that your doctor recommends.  Throw the gauze pad into the trash bag.  Wash your hands with soap and water. Dry your hands with a clean towel. If you cannot use soap and water, use hand sanitizer. Putting on the Dressing  If your doctor recommended a skin protectant, put it on the skin around the wound.  Cover the wound with the recommended dressing, such as a nonstick gauze or bandage. Make sure to touch only the outside edges of the dressing. Do not touch the inside of the dressing.  Attach  the dressing so all sides stay in place. You may do this with the attached medical adhesive, roll gauze, or tape. If you use tape, do not wrap the tape all the way around your arm or leg.  Take off your gloves. Put them in the trash bag with the old dressing. Tie the bag shut and throw it away.  Wash your hands with soap and water. Dry your hands with a clean towel. If you cannot use soap and water, use hand sanitizer. Get help if:   You have new pain.  You have irritation, a rash, or itching around the wound or dressing.  Changing your dressing is painful.  Changing your dressing causes a lot of bleeding. Get  help right away if:  You have very bad pain.  You have signs of infection, such as: ? More redness, swelling, or pain. ? More fluid or blood. ? Warmth. ? Pus or a bad smell. ? Red streaks leading from wound. ? A fever. This information is not intended to replace advice given to you by your health care provider. Make sure you discuss any questions you have with your health care provider. Document Released: 11/24/2008 Document Revised: 02/03/2016 Document Reviewed: 06/03/2015 Elsevier Interactive Patient Education  2019 [employer].

## 2018-11-20 NOTE — Progress Notes (Signed)
Patient ID: [name], female    DOB: [birth year], 78 y.o.   MRN: [id number]  PCP: Susy Frizzle, MD  Chief Complaint  Patient presents with   Foot Pain    Patient in with large blister to right great toe. Onset Saturday.. Patient would like a refill on Adderall and Trazadone    Subjective:   [name] is a 78 y.o. female, presents to clinic with CC of blister to right great toe, onset 3 to 4 days ago, is nearly covering the dorsum of her toe has clear fluid in it without any swelling around it.  She denies any pain or itching.  She does state that her shoes were rubbing until she stopped wearing them and she has only been wearing flip-flops.  Shes had no drainage.  No rash or blisters anywhere else.  She would like it drained here today.      Patient Active Problem List   Diagnosis Date Noted   Chronic respiratory failure with hypoxia (HCC)/ nocturnal hypoxemia complicated by polycythemia 07/01/2017   Dyspnea on exertion 06/29/2017   ADD (attention deficit disorder)    Multiple pulmonary nodules 05/08/2017   Palliative care encounter    Tobacco abuse 07/29/2015   Anxiety state 07/29/2015   COPD exacerbation ([place]) 07/28/2015   Acute respiratory failure with hypoxia ([place]) 07/28/2015   Polycythemia 07/28/2015   Nocturnal hypoxia 07/26/2015   Encounter for follow-up surveillance of lung cancer 09/29/2014   Atypical chest pain 12/01/2013   Other malaise and fatigue 02/15/2013   Hypothyroidism 02/15/2013   Stye 02/15/2013   Neck pain 02/15/2013   Depression 03/26/2012   Hypertension 03/26/2012   Peptic ulcer disease 03/26/2012   Lung cancer ([place]) 08/31/2011   PREMATURE VENTRICULAR CONTRACTIONS 07/03/2007   Allergic rhinitis due to allergen 07/03/2007   COPD GOLD II 07/03/2007   EMPYEMA 07/03/2007   GASTROINTESTINAL HEMORRHAGE 07/03/2007   MITRAL VALVE PROLAPSE, HX OF 07/03/2007   OOPHORECTOMY, LEFT, HX OF 07/03/2007      Prior to Admission medications   Medication Sig Start Date End Date Taking? Authorizing Provider  acetaminophen (TYLENOL) 325 MG tablet Take by mouth.   Yes [provider]  albuterol (PROAIR HFA) 108 (90 Base) MCG/ACT inhaler Inhale 1 puff into the lungs every 6 (six) hours as needed for wheezing or shortness of breath.   Yes [provider]  albuterol (PROVENTIL) (2.5 MG/3ML) 0.083% nebulizer solution USE 1 VIAL (3 ML OR 2.5 MG TOTAL) VIA NEBULIZER EVERY 4 HOURS AS NEEDED FOR WHEEZING OR SHORTNESS OF BREATH 01/16/17  Yes Susy Frizzle, MD  ALPRAZolam Duanne Moron) 0.5 MG tablet TAKE 1 TABLET BY MOUTH EVERY DAY AT BEDTIME AS NEEDED ANXIETY OR INSOMNIA 09/30/18  Yes Susy Frizzle, MD  amphetamine-dextroamphetamine (ADDERALL XR) 15 MG 24 hr capsule Take 1 capsule by mouth every morning. 09/13/18  Yes Susy Frizzle, MD  b complex-C-folic acid 1 MG capsule Take 1 capsule by mouth daily.     Yes [provider]  BEVESPI AEROSPHERE 9-4.8 MCG/ACT AERO USE 2 INHALATIONS TWICE A DAY 10/17/18  Yes Tanda Rockers, MD  buPROPion (WELLBUTRIN XL) 150 MG 24 hr tablet TAKE 1 TABLET DAILY 01/01/18  Yes Susy Frizzle, MD  Calcium Carbonate-Vit D-Min (CALCIUM 1200 PO) Take by mouth.   Yes [provider]  Cyanocobalamin 1000 MCG CAPS Take by mouth.   Yes [provider]  doxazosin (CARDURA) 4 MG tablet Take 1 tablet (4 mg total)  by mouth daily. 11/12/18  Yes Susy Frizzle, MD  DULoxetine (CYMBALTA) 60 MG capsule TAKE 1 CAPSULE DAILY 11/19/17  Yes Susy Frizzle, MD  fluticasone [facility]) 50 MCG/ACT nasal spray USE 2 SPRAYS IN [facility] NOSTRIL DAILY 10/29/18  Yes Susy Frizzle, MD  folic acid (FOLVITE) 1 MG tablet Take by mouth.   Yes [provider]  levothyroxine (SYNTHROID, LEVOTHROID) 100 MCG tablet TAKE 1 TABLET EVERY MORNING 06/26/18  Yes Susy Frizzle, MD  losartan-hydrochlorothiazide [facility]) 100-25 MG tablet TAKE 1 TABLET DAILY 10/29/18   Yes Susy Frizzle, MD  omeprazole (PRILOSEC) 20 MG capsule TAKE 1 CAPSULE DAILY 10/01/17  Yes Susy Frizzle, MD  OXYGEN 2lpm with sleep   Yes [provider]  pravastatin (PRAVACHOL) 40 MG tablet TAKE 1 TABLET DAILY (STOP SIMVASTATIN) 09/24/18  Yes Susy Frizzle, MD  traZODone (DESYREL) 50 MG tablet Take 1 tablet (50 mg total) by mouth at bedtime as needed. for sleep 05/30/18  Yes Susy Frizzle, MD  VITAMIN D, CHOLECALCIFEROL, PO Take 1 tablet by mouth daily.   Yes [provider]     Allergies  Allergen Reactions   Doxycycline Nausea Only   Erythromycin Nausea Only   Sulfonamide Derivatives Hives     Family History  Problem Relation Age of Onset   Graves' disease Daughter    Breast cancer Maternal Aunt    Heart disease Maternal Aunt    Hypertension Mother    Arthritis Maternal Grandmother      Social History   Socioeconomic History   Marital status: Married    Spouse name: Not on file   Number of children: 3   Years of education: Not on file   Highest education level: Not on file  Occupational History   Occupation: retired Optician, dispensing: RETIRED  Social Designer, fashion/clothing strain: Not on file   Food insecurity:    Worry: Not on file    Inability: Not on file   Transportation needs:    Medical: Not on file    Non-medical: Not on file  Tobacco Use   Smoking status: Former Smoker    Packs/day: 0.75    Years: 30.00    Pack years: 22.50    Types: Cigarettes    Last attempt to quit: 09/11/2004    Years since quitting: 14.2   Smokeless tobacco: Never Used  Substance and Sexual Activity   Alcohol use: Yes    Alcohol/week: 7.0 standard drinks    Types: 7 Shots of liquor per week    Comment: 1-2 drinks nightly    Drug use: No   Sexual activity: Yes  Lifestyle   Physical activity:    Days per week: Not on file    Minutes per session: Not on file   Stress: Not on file  Relationships   Social  connections:    Talks on phone: Not on file    Gets together: Not on file    Attends religious service: Not on file    Active member of club or organization: Not on file    Attends meetings of clubs or organizations: Not on file    Relationship status: Not on file   Intimate partner violence:    Fear of current or ex partner: Not on file    Emotionally abused: Not on file    Physically abused: Not on file    Forced sexual activity: Not on file  Other Topics Concern  Not on file  Social History Narrative   Not on file     Review of Systems  Constitutional: Negative.   HENT: Negative.   Eyes: Negative.   Respiratory: Negative.   Cardiovascular: Negative.   Gastrointestinal: Negative.   Endocrine: Negative.   Genitourinary: Negative.   Musculoskeletal: Negative.   Skin: Negative.   Allergic/Immunologic: Negative.   Neurological: Negative.   Hematological: Negative.   Psychiatric/Behavioral: Negative.   All other systems reviewed and are negative.      Objective:    Vitals:   11/20/18 0932  BP: 110/68  Pulse: 82  Resp: 16  Temp: 98.8 F (37.1 C)  TempSrc: Oral  SpO2: 97%  Weight: 175 lb 6 oz (79.5 kg)  Height: 5\' 5"  (1.651 m)      Physical Exam Vitals signs and nursing note reviewed.  Constitutional:      Appearance: She is well-developed.  HENT:     Head: Normocephalic and atraumatic.     Nose: Nose normal.  Eyes:     General:        Right eye: No discharge.        Left eye: No discharge.     Conjunctiva/sclera: Conjunctivae normal.  Neck:     Trachea: No tracheal deviation.  Cardiovascular:     Rate and Rhythm: Normal rate and regular rhythm.  Pulmonary:     Effort: Pulmonary effort is normal. No respiratory distress.     Breath sounds: No stridor.  Musculoskeletal: Normal range of motion.     Right foot: Normal range of motion. No deformity.     Left foot: Normal range of motion. No deformity.  Feet:     Right foot:     Skin integrity:  Blister present. No ulcer, skin breakdown, erythema, warmth, callus, dry skin or fissure.     Toenail Condition: Right toenails are normal.     Left foot:     Skin integrity: No ulcer, blister, skin breakdown, erythema, warmth, callus, dry skin or fissure.     Toenail Condition: Left toenails are normal.     Comments: Large blister, appears to have clear fluid, covering dorsum of right great toe, roughly 3x2 cm, no surrounding skin edema, erythema.  No ttp Normal sensation, strength and capillary refill of right foot Skin:    General: Skin is warm and dry.     Findings: No rash.  Neurological:     Mental Status: She is alert.     Motor: No abnormal muscle tone.     Coordination: Coordination normal.  Psychiatric:        Behavior: Behavior normal.     Patient's toe was prepped and draped in aseptic technique applying Betadine and using sterile gloves, pickups and sterile scissors, did open blister, all fluid was clear serous fluid no precipitate or purulence no blood, trimmed the thin layer of blistered skin to near the margins of the blister, wound culture was obtained, antibiotic ointment, nonadherent gauze was applied and secured with loose co-band.  Pt tolerated      Assessment & Plan:      ICD-10-CM   1. Open wound of toe, initial encounter S91.109A WOUND CULTURE    Large blister to right great toe, patient requested that it be drained here under clean conditions, there was no purulence bleeding or tenderness she has no signs of cellulitis, wound culture obtained, wound care reviewed, antibiotic given in case she develops any signs of infection -which were reviewed with her  in her nursing background she does verbalize understanding and has a knowledge of what infection and cellulitis would look like and feel like.  Encouraged to follow-up as needed.   Delsa Grana, PA-C 11/20/18 9:53 AM

## 2018-11-22 ENCOUNTER — Encounter: Payer: Self-pay | Admitting: Family Medicine

## 2018-11-23 LAB — WOUND CULTURE
GRAM STAIN:: NONE SEEN
MICRO NUMBER:: 305769
RESULT:: NO GROWTH
SPECIMEN QUALITY:: ADEQUATE

## 2018-12-06 ENCOUNTER — Ambulatory Visit: Payer: Medicare Other

## 2019-01-01 ENCOUNTER — Other Ambulatory Visit: Payer: Self-pay | Admitting: Family Medicine

## 2019-01-02 ENCOUNTER — Other Ambulatory Visit: Payer: Self-pay | Admitting: *Deleted

## 2019-01-02 DIAGNOSIS — E7800 Pure hypercholesterolemia, unspecified: Secondary | ICD-10-CM

## 2019-01-02 MED ORDER — LEVOTHYROXINE SODIUM 100 MCG PO TABS: 100.0000 ug | ORAL_TABLET | Freq: Every morning | ORAL | 4 refills | Status: DC

## 2019-01-02 MED ORDER — DOXAZOSIN MESYLATE 4 MG PO TABS: 4.0000 mg | ORAL_TABLET | Freq: Every day | ORAL | 4 refills | Status: DC

## 2019-01-02 MED ORDER — PRAVASTATIN SODIUM 40 MG PO TABS: ORAL_TABLET | ORAL | 4 refills | Status: DC

## 2019-01-02 MED ORDER — ALPRAZOLAM 0.5 MG PO TABS: ORAL_TABLET | ORAL | 0 refills | Status: DC

## 2019-01-02 MED ORDER — FLUTICASONE PROPIONATE 50 MCG/ACT NA SUSP: 2.0000 | Freq: Every day | NASAL | 4 refills | Status: DC

## 2019-01-02 MED ORDER — LOSARTAN POTASSIUM-HCTZ 100-25 MG PO TABS: 1.0000 | ORAL_TABLET | Freq: Every day | ORAL | 4 refills | Status: DC

## 2019-01-02 NOTE — Telephone Encounter (Signed)
Received fax requesting refill on Xanax to mail order.   Ok to refill??  Last office visit 11/20/2018.  Last refill 09/30/2018.

## 2019-01-07 ENCOUNTER — Telehealth: Payer: Self-pay | Admitting: Family Medicine

## 2019-01-07 NOTE — Telephone Encounter (Signed)
Pt needs refill on losartan and hctz (separate not combo pill) [facility]

## 2019-01-08 MED ORDER — HYDROCHLOROTHIAZIDE 25 MG PO TABS: 25.0000 mg | ORAL_TABLET | Freq: Every day | ORAL | 3 refills | Status: DC

## 2019-01-08 MED ORDER — LOSARTAN POTASSIUM 100 MG PO TABS: 100.0000 mg | ORAL_TABLET | Freq: Every day | ORAL | 3 refills | Status: DC

## 2019-01-08 NOTE — Telephone Encounter (Signed)
Medication called/sent to requested pharmacy  

## 2019-01-13 ENCOUNTER — Encounter: Payer: Self-pay | Admitting: Family Medicine

## 2019-01-13 ENCOUNTER — Ambulatory Visit (INDEPENDENT_AMBULATORY_CARE_PROVIDER_SITE_OTHER): Payer: Medicare Other | Admitting: Family Medicine

## 2019-01-13 ENCOUNTER — Other Ambulatory Visit: Payer: Self-pay

## 2019-01-13 DIAGNOSIS — J9611 Chronic respiratory failure with hypoxia: Secondary | ICD-10-CM

## 2019-01-13 DIAGNOSIS — J441 Chronic obstructive pulmonary disease with (acute) exacerbation: Secondary | ICD-10-CM

## 2019-01-13 DIAGNOSIS — R6889 Other general symptoms and signs: Secondary | ICD-10-CM

## 2019-01-13 DIAGNOSIS — Z20822 Contact with and (suspected) exposure to covid-19: Secondary | ICD-10-CM

## 2019-01-13 MED ORDER — LEVOFLOXACIN 500 MG PO TABS: 500.0000 mg | ORAL_TABLET | Freq: Every day | ORAL | 0 refills | Status: DC

## 2019-01-13 MED ORDER — PREDNISONE 10 MG PO TABS: ORAL_TABLET | ORAL | 0 refills | Status: DC

## 2019-01-13 NOTE — Progress Notes (Signed)
Virtual Visit via Telephone Note  I connected with [name] on 01/13/19 at 2:16pm  by telephone and verified that I am speaking with the correct person using two identifiers. Pt location: at home Physician location: at office, [facility]    I discussed the limitations, risks, security and privacy concerns of performing an evaluation and management service by telephone and the availability of in person appointments. I also discussed with the patient that there may be a patient responsible charge related to this service. The patient expressed understanding and agreed to proceed.   History of Present Illness:  Last week had congestion with cough. 1 week ago Monday, had a fever  100F with headache. Felt more SOB last week. She has been checking oxygen sat past few days 90-92% depending on if she is up and about. Her HR does shoot up when she is moving around. Temp continues to run 99-99.6 She did use her oxygen all day yesterday she felt short of breath.  She does not want to go to the hospital but was asking about COVID testing.  She is still coughing with some congestion and has low-grade fever. states that she has been quarantined within her home since all of this started however she did return from a trip back to [place] in March when she initially felt sick and was treated with antibiotics at that time.  Underlying COPD with chronic respiratory failure.  History of lung cancer status post treatment in 2011.  She denies any nausea or vomiting no GI symptoms.  Denies any current body aches    Observations/Objective: Speaking in full sentences, no acute distress noted on phone . No cough noted   Assessment and Plan: COPD exacerbation-  Will treat for COPD exacerbation, possible COVID-19 underlying as well  she requested testing with her symptoms can be difficult to differentiate but she is requiring her oxygen a little more and does not want to go to ER. She asked about  testing at [facility], orders written In any case will also treat with prednisone taper, Levaquin due to her allergies- she cant take azithromycin or doxycycline.  Use nebs or albuterol inhaler While based on our conversation she sounded like her baseline with speech- I did discuss with her reasons to go to ER as this could be life threatning, states she does not want to be on a Vent. She is aware to continue to quarantine within her home   Follow Up Instructions:    I discussed the assessment and treatment plan with the patient. The patient was provided an opportunity to ask questions and all were answered. The patient agreed with the plan and demonstrated an understanding of the instructions.   The patient was advised to call back or seek an in-person evaluation if the symptoms worsen or if the condition fails to improve as anticipated.  I provided  55minutes of non-face-to-face time during this encounter. End time 2:33pm  Vic Blackbird, MD

## 2019-01-14 ENCOUNTER — Other Ambulatory Visit: Payer: Self-pay | Admitting: Family Medicine

## 2019-01-14 ENCOUNTER — Telehealth: Payer: Self-pay | Admitting: *Deleted

## 2019-01-14 MED ORDER — AMPHETAMINE-DEXTROAMPHET ER 15 MG PO CP24: 15.0000 mg | ORAL_CAPSULE | ORAL | 0 refills | Status: DC

## 2019-01-14 NOTE — Telephone Encounter (Signed)
Pt requesting refill on Adderall      LOV: 01/13/19  LRF:  09/13/18

## 2019-01-14 NOTE — Telephone Encounter (Signed)
Call placed to patient.  Reports that she is going this afternoon to have COVID testing.   States that she has been taking Levaquin and Prednisone as prescribed and is feeling better. States that SOB has improved. No fever has been noted. Current temperature noted at 98.8 oral.   MD to be made aware.

## 2019-01-14 NOTE — Telephone Encounter (Signed)
noted 

## 2019-01-14 NOTE — Telephone Encounter (Signed)
-----   Message from Alycia Rossetti, MD sent at 01/13/2019  3:23 PM EDT ----- Regarding: Call and check on pt, not sure ifshe went to go get COVID testing

## 2019-01-15 ENCOUNTER — Ambulatory Visit: Payer: Medicare Other

## 2019-01-15 NOTE — Telephone Encounter (Signed)
Call placed to patient for daily report.   Patient Reported Symptoms Y/N  Cough Y  SOB Y  Fever N  Taking IBU/APAP   Chills N  Muscle pain N  Headache Y  Sore throat N  Loss of taste/ smell N   Patient reports that she is still taking Levaquin and Prednisone. States that she is having increased SOB with exertion. Reports that she is using her home O2 continuously at this time. States that son is EMT and keeps a check on her sats. States that he reports that they are WNL.   Also reports that she and spouse did have COVID testing on 01/14/2019. Results should be back by5/03/2019.  Advised to continue quarantine until results obtained.

## 2019-01-15 NOTE — Telephone Encounter (Signed)
Call placed to patient for daily report.   Patient requested to call back at a later time.

## 2019-01-15 NOTE — Telephone Encounter (Signed)
Agree with below. Addendum:    I believe that she has limits with moving and including, toileting, bathing, feeding, dressing and grooming. I believe the power wheelchair is needed for pt to be able to perform ADL's in her home

## 2019-01-16 MED ORDER — NYSTATIN 100000 UNIT/ML MT SUSP: 5.0000 mL | Freq: Four times a day (QID) | OROMUCOSAL | 0 refills | Status: DC

## 2019-01-16 NOTE — Addendum Note (Signed)
Addended by: Sheral Flow on: 01/16/2019 10:23 AM   Modules accepted: Orders

## 2019-01-16 NOTE — Telephone Encounter (Addendum)
Call placed to patient for daily report. [phone]~ telephone.   Patient Reported Symptoms Y/N  Cough N  SOB Y  Fever Y  Taking IBU/APAP Y  Chills N  Muscle pain N  Headache Y  Sore throat Y  Loss of taste/ smell N    Patient reports that she has not received results of COVID testing at this time.   States that she continues prednisone and ABTx. Sates that she has some sore throat and has noticed ulcer to mouth. Prescription sent to pharmacy for Nystatin. Patient daughter is going to pick up with some probiotics.   States that she continues to have a slight cough, but SOB improving. She is not requiring O2 at this time.   States that she has not been checking her temperature, but she feels as though she may have a slight fever. She continues to take APAP.   Advised to continue quarantine.

## 2019-01-17 ENCOUNTER — Ambulatory Visit: Payer: Medicare Other | Admitting: Internal Medicine

## 2019-01-17 NOTE — Telephone Encounter (Signed)
noted 

## 2019-01-17 NOTE — Telephone Encounter (Signed)
Continue to quarentine I would not put Y in the fever section unless it is a documented fever

## 2019-01-17 NOTE — Telephone Encounter (Signed)
Call placed to patient for daily report. [phone]~ telephone.  Patient Reported Symptoms Y/N  Cough N  SOB N  Fever N  Taking IBU/APAP N  Chills N  Muscle pain N  Headache Y  Sore throat Y  Loss of taste/ smell N   Patient reports that she has not received COVID testing results at this time.   Current temperature is 97.6 oral.  Reports that mouth and throat pain is improved with Nystatin.   States that she is breathing much better and is off of O2 at this time. No SOB, cough noted.   Patient continues the ABTx and Prednisone.   Advised to continue quarantine.

## 2019-01-20 NOTE — Telephone Encounter (Signed)
Call us when she receives her results so we can document  D/C daily call as she is improved

## 2019-01-20 NOTE — Telephone Encounter (Signed)
Call placed to patient for daily report. [phone]~ telephone.  Patient Reported Symptoms Y/N  Cough N  SOB N  Fever N  Taking IBU/APAP N  Chills N  Muscle pain N  Headache N  Sore throat N  Loss of taste/ smell N   SPO2 does drop with exertion to 90-91%, but rebounds to 96- 97% easily with rest. States that she is only intermittently wearing O2.   States that she did receive call in regards to COVID testing. States that testing will be delayed, but results should be available by 01/22/2019.  Patient has completed ABTx, and has (1) tab of prednisone left for 01/21/2019.

## 2019-01-20 NOTE — Telephone Encounter (Signed)
Call placed to patient and patient made aware.  

## 2019-01-23 NOTE — Telephone Encounter (Signed)
Received call from patient.   Reports that she has been informed by health department that COIVD testing is negative.

## 2019-01-24 NOTE — Telephone Encounter (Signed)
noted 

## 2019-01-27 ENCOUNTER — Other Ambulatory Visit: Payer: Self-pay

## 2019-01-28 ENCOUNTER — Ambulatory Visit
Admission: RE | Admit: 2019-01-28 | Discharge: 2019-01-28 | Disposition: A | Discharge status: Home / Self Care | Payer: Medicare Other | Source: Ambulatory Visit | Attending: Family Medicine | Admitting: Family Medicine

## 2019-01-28 ENCOUNTER — Encounter: Payer: Self-pay | Admitting: Family Medicine

## 2019-01-28 ENCOUNTER — Ambulatory Visit (INDEPENDENT_AMBULATORY_CARE_PROVIDER_SITE_OTHER): Payer: Medicare Other | Admitting: Family Medicine

## 2019-01-28 VITALS — BP 146/88 | HR 102 | Temp 98.7°F | Resp 22 | Ht 65.0 in | Wt 180.0 lb

## 2019-01-28 DIAGNOSIS — R0600 Dyspnea, unspecified: Secondary | ICD-10-CM

## 2019-01-28 DIAGNOSIS — R0602 Shortness of breath: Secondary | ICD-10-CM | POA: Diagnosis not present

## 2019-01-28 DIAGNOSIS — R300 Dysuria: Secondary | ICD-10-CM | POA: Diagnosis not present

## 2019-01-28 DIAGNOSIS — R000 Tachycardia, unspecified: Secondary | ICD-10-CM

## 2019-01-28 DIAGNOSIS — J9611 Chronic respiratory failure with hypoxia: Secondary | ICD-10-CM | POA: Diagnosis not present

## 2019-01-28 DIAGNOSIS — R0609 Other forms of dyspnea: Secondary | ICD-10-CM

## 2019-01-28 LAB — URINALYSIS, ROUTINE W REFLEX MICROSCOPIC
Bilirubin Urine: NEGATIVE
Glucose, UA: NEGATIVE
Hgb urine dipstick: NEGATIVE
Leukocytes,Ua: NEGATIVE
Nitrite: NEGATIVE
Protein, ur: NEGATIVE
Specific Gravity, Urine: 1.025 (ref 1.001–1.03)
pH: 5.5 (ref 5.0–8.0)

## 2019-01-28 NOTE — Progress Notes (Signed)
Subjective:    Patient ID: [name], female    DOB: 1941/01/02, 78 y.o.   MRN: [id number]  HPI Patient presents today with shortness of breath with activity.  She states that she just does not feel well.  However she states that since January she feels very short of breath with any activity that she does.  Currently she is wearing oxygen at night however she feels that she would benefit from oxygen during the daytime with activity because after walking short distances, she begins to wheeze and feel very short of breath.  She denies any chest pain.  She is using Bevespi 2 puffs inhaled twice daily.  She denies any seasonal allergies.  She denies any acid reflux and she is on a proton pump inhibitor.  She denies any recent fever or chills or purulent sputum.  She does have increased urinary frequency and urgency and is concerned that she may have a urinary tract infection adding to her symptomatology making her feel worse however her urinalysis today shows only trace ketones and is otherwise negative for any leukocyte esterase, nitrites, or blood.  Patient is satting 97% on room air.  I walked with the patient around our clinic.  After 1 lap which is approximately 60 feet, the patient's oxygen saturation was 95%, after 2 laps her oxygen saturation was 93% and after the third lap her oxygen saturation was down to 91%.  She was wheezing on expiration however the wheezing sounded more akin to upper airway sounds rather than true deep bronchospasm.  She does have tachycardia.  Heart rate is 100 at rest and with activity rises to 110 bpm.    Review of her medical records show that she had an echocardiogram of the heart performed in 2010.  At that time ejection fraction was 60 to 65%.  However I can find no other echocardiogram since that time.  She has a history of lung cancer currently managed at [facility].  The last CT scan of the chest in epic however was many years ago.  I do not see a recent chest x-ray  or imaging of the lung.  To get a baseline of her COPD I did perform pulmonary function test.  Patient's FEV1 was 1.25 L which is 62% of predicted.  Patient's FVC is 2.16 L which is 79% of predicted.  Her FEV1 to FVC ratio was 58%.  Therefore she has moderate obstruction stage II borderline stage III  Past Medical History:  Diagnosis Date  . ADD (attention deficit disorder)   . Allergic rhinitis   . Anemia   . Anxiety   . Arrhythmia   . Arthritis    "hands, neck, right shoulder" (07/28/2015)  . Aseptic necrosis ([place])   . Asthma dxc'd 05/2015  . Basal cell cancer   . Bleeding stomach ulcer ~ 06/2005  . COPD (chronic obstructive pulmonary disease) ([place])   . Depression   . Diverticula of colon   . Dyspnea   . Esophagitis   . Gastric ulcer with hemorrhage   . GERD (gastroesophageal reflux disease)   . Heart murmur   . History of blood transfusion 06/2005   "related to pneumonia"  . History of kidney stones   . Hyperlipidemia   . Hypertension   . Hypothyroidism   . Kidney stones   . Lung cancer ([place])    Non small cell lung cancer, adenocarcinoma with BAC 09/2009, VATS converted to wedge resection of left upper lobe mass  .  Mitral valve prolapse    "no ORs" (07/28/2015)  . Osteopenia   . Pneumonia 1970's; 06/2005  . Sleep apnea dx'd 06/2015   on oxygen at nite at 2L/[place]   . Squamous cell skin cancer    Past Surgical History:  Procedure Laterality Date  . ABDOMINAL HYSTERECTOMY  1988  . COLONOSCOPY WITH PROPOFOL N/A 06/15/2017   Procedure: COLONOSCOPY WITH PROPOFOL;  Surgeon: Carol Ada, MD;  Location: [facility];  Service: Endoscopy;  Laterality: N/A;  . HAMMER TOE SURGERY Right ~ 2010  . SALPINGOOPHORECTOMY Left 1988  . SKIN CANCER EXCISION     "I've had severl cut off RLE; left foreqrm, nose under nose"  . THORACOTOMY Left 06/2005  . THORACOTOMY/LOBECTOMY  09/2009; 11/2009   left; right   Current Outpatient Medications on File Prior to Visit  Medication Sig Dispense  Refill  . acetaminophen (TYLENOL) 325 MG tablet Take by mouth.    Marland Kitchen albuterol (PROAIR HFA) 108 (90 Base) MCG/ACT inhaler Inhale 1 puff into the lungs every 6 (six) hours as needed for wheezing or shortness of breath.    Marland Kitchen albuterol (PROVENTIL) (2.5 MG/3ML) 0.083% nebulizer solution USE 1 VIAL (3 ML OR 2.5 MG TOTAL) VIA NEBULIZER EVERY 4 HOURS AS NEEDED FOR WHEEZING OR SHORTNESS OF BREATH 450 mL 3  . ALPRAZolam (XANAX) 0.5 MG tablet TAKE 1 TABLET BY MOUTH EVERY DAY AT BEDTIME AS NEEDED ANXIETY OR INSOMNIA 90 tablet 0  . amphetamine-dextroamphetamine (ADDERALL XR) 15 MG 24 hr capsule Take 1 capsule by mouth every morning. 30 capsule 0  . b complex-C-folic acid 1 MG capsule Take 1 capsule by mouth daily.      Marland Kitchen BEVESPI AEROSPHERE 9-4.8 MCG/ACT AERO USE 2 INHALATIONS TWICE A DAY 32.1 g 3  . buPROPion (WELLBUTRIN XL) 150 MG 24 hr tablet TAKE 1 TABLET DAILY 90 tablet 3  . Calcium Carbonate-Vit D-Min (CALCIUM 1200 PO) Take by mouth.    . Cyanocobalamin 1000 MCG CAPS Take by mouth.    . doxazosin (CARDURA) 4 MG tablet Take 1 tablet (4 mg total) by mouth daily. 90 tablet 4  . DULoxetine (CYMBALTA) 60 MG capsule TAKE 1 CAPSULE DAILY 90 capsule 3  . fluticasone (FLONASE) 50 MCG/ACT nasal spray Place 2 sprays into both nostrils daily. 48 g 4  . folic acid (FOLVITE) 1 MG tablet Take by mouth.    . levothyroxine (SYNTHROID) 100 MCG tablet Take 1 tablet (100 mcg total) by mouth every morning. 90 tablet 4  . losartan-hydrochlorothiazide (HYZAAR) 100-25 MG tablet Take 1 tablet by mouth daily. 90 tablet 4  . nystatin (MYCOSTATIN) 100000 UNIT/ML suspension Take 5 mLs (500,000 Units total) by mouth 4 (four) times daily. Swish and swallow. 120 mL 0  . omeprazole (PRILOSEC) 20 MG capsule TAKE 1 CAPSULE DAILY 90 capsule 3  . OXYGEN 2lpm with sleep    . pravastatin (PRAVACHOL) 40 MG tablet TAKE 1 TABLET DAILY (STOP SIMVASTATIN) 90 tablet 4  . RESTASIS 0.05 % ophthalmic emulsion INT 1 GTT INTO OU BID    . traZODone  (DESYREL) 50 MG tablet TAKE 1 TABLET AT BEDTIME AS NEEDED FOR SLEEP 90 tablet 3  . VITAMIN D, CHOLECALCIFEROL, PO Take 1 tablet by mouth daily.     No current facility-administered medications on file prior to visit.    Allergies  Allergen Reactions  . Doxycycline Nausea Only  . Erythromycin Nausea Only  . Sulfonamide Derivatives Hives   Social History   Socioeconomic History  . Marital status: Married  Spouse name: Not on file  . Number of children: 3  . Years of education: Not on file  . Highest education level: Not on file  Occupational History  . Occupation: retired Optician, dispensing: RETIRED  Social Needs  . Financial resource strain: Not on file  . Food insecurity:    Worry: Not on file    Inability: Not on file  . Transportation needs:    Medical: Not on file    Non-medical: Not on file  Tobacco Use  . Smoking status: Former Smoker    Packs/day: 0.75    Years: 30.00    Pack years: 22.50    Types: Cigarettes    Last attempt to quit: 09/11/2004    Years since quitting: 14.3  . Smokeless tobacco: Never Used  Substance and Sexual Activity  . Alcohol use: Yes    Alcohol/week: 7.0 standard drinks    Types: 7 Shots of liquor per week    Comment: 1-2 drinks nightly   . Drug use: No  . Sexual activity: Yes  Lifestyle  . Physical activity:    Days per week: Not on file    Minutes per session: Not on file  . Stress: Not on file  Relationships  . Social connections:    Talks on phone: Not on file    Gets together: Not on file    Attends religious service: Not on file    Active member of club or organization: Not on file    Attends meetings of clubs or organizations: Not on file    Relationship status: Not on file  . Intimate partner violence:    Fear of current or ex partner: Not on file    Emotionally abused: Not on file    Physically abused: Not on file    Forced sexual activity: Not on file  Other Topics Concern  . Not on file  Social History Narrative   . Not on file      Review of Systems  All other systems reviewed and are negative.      Objective:   Physical Exam Constitutional:      General: She is not in acute distress.    Appearance: Normal appearance. She is normal weight. She is not ill-appearing or toxic-appearing.  HENT:     Nose: Nose normal. No congestion or rhinorrhea.  Cardiovascular:     Rate and Rhythm: Regular rhythm. Tachycardia present.     Heart sounds: Normal heart sounds.  Pulmonary:     Effort: Pulmonary effort is normal. No respiratory distress.     Breath sounds: No stridor. No wheezing, rhonchi or rales.  Musculoskeletal:     Right lower leg: No edema.     Left lower leg: No edema.  Neurological:     Mental Status: She is alert.           Assessment & Plan:  Dysuria - Plan: Urinalysis, Routine w reflex microscopic  Chronic respiratory failure with hypoxia (HCC)/ nocturnal hypoxemia complicated by polycythemia  Dyspnea on exertion - Plan: CBC with Differential/Platelet, COMPLETE METABOLIC PANEL WITH GFR, TSH, D-dimer, quantitative (not at [facility]), DG Chest 2 View, ECHOCARDIOGRAM COMPLETE  Tachycardia - Plan: TSH, D-dimer, quantitative (not at [facility])  Patient has borderline stage III COPD and may benefit from switching Bevespi to Trelegy.  She does have mild oxygen drops with ambulation but it does not require oxygen.  Her shortness of breath however is out of proportion to her exam today.  She is very winded simply walking around the office she is also very tachycardic.  Given her recent trip to [place] and plane ride and history of lung cancer, pulmonary embolism is on the differential diagnosis particular with her tachycardia.  Therefore I will check a d-dimer and if positive a CT scan of the lung to rule out PE.  Also get an echocardiogram to evaluate for any evidence of congestive heart failure.  I will check a CBC, CMP, and TSH as well.  Once I have the above work-up completed if no other  explanation is found, I will switch the patient from [facility] to Trelegy.  Obtain baseline chest x-ray today

## 2019-01-29 LAB — CBC WITH DIFFERENTIAL/PLATELET
Absolute Monocytes: 889 cells/uL (ref 200–950)
Basophils Absolute: 71 cells/uL (ref 0–200)
Basophils Relative: 0.7 %
Eosinophils Absolute: 293 cells/uL (ref 15–500)
Eosinophils Relative: 2.9 %
HCT: 42 % (ref 35.0–45.0)
Hemoglobin: 14.4 g/dL (ref 11.7–15.5)
Lymphs Abs: 2000 cells/uL (ref 850–3900)
MCH: 35.1 pg — ABNORMAL HIGH (ref 27.0–33.0)
MCHC: 34.3 g/dL (ref 32.0–36.0)
MCV: 102.4 fL — ABNORMAL HIGH (ref 80.0–100.0)
MPV: 10.9 fL (ref 7.5–12.5)
Monocytes Relative: 8.8 %
Neutro Abs: 6848 cells/uL (ref 1500–7800)
Neutrophils Relative %: 67.8 %
Platelets: 267 10*3/uL (ref 140–400)
RBC: 4.1 10*6/uL (ref 3.80–5.10)
RDW: 12.5 % (ref 11.0–15.0)
Total Lymphocyte: 19.8 %
WBC: 10.1 10*3/uL (ref 3.8–10.8)

## 2019-01-29 LAB — COMPLETE METABOLIC PANEL WITH GFR
AG Ratio: 1.8 (calc) (ref 1.0–2.5)
ALT: 19 U/L (ref 6–29)
AST: 24 U/L (ref 10–35)
Albumin: 4.4 g/dL (ref 3.6–5.1)
Alkaline phosphatase (APISO): 81 U/L (ref 37–153)
BUN/Creatinine Ratio: 35 (calc) — ABNORMAL HIGH (ref 6–22)
BUN: 29 mg/dL — ABNORMAL HIGH (ref 7–25)
CO2: 29 mmol/L (ref 20–32)
Calcium: 10 mg/dL (ref 8.6–10.4)
Chloride: 97 mmol/L — ABNORMAL LOW (ref 98–110)
Creat: 0.83 mg/dL (ref 0.60–0.93)
GFR, Est African American: 79 mL/min/{1.73_m2} (ref >=60)
GFR, Est Non African American: 68 mL/min/{1.73_m2} (ref >=60)
Globulin: 2.4 g/dL (calc) (ref 1.9–3.7)
Glucose, Bld: 108 mg/dL — ABNORMAL HIGH (ref 65–99)
Potassium: 5.3 mmol/L (ref 3.5–5.3)
Sodium: 137 mmol/L (ref 135–146)
Total Bilirubin: 0.4 mg/dL (ref 0.2–1.2)
Total Protein: 6.8 g/dL (ref 6.1–8.1)

## 2019-01-29 LAB — TSH: TSH: 4.56 mIU/L — ABNORMAL HIGH (ref 0.40–4.50)

## 2019-01-29 LAB — D-DIMER, QUANTITATIVE: D-Dimer, Quant: 0.33 mcg/mL FEU (ref <0.50)

## 2019-01-30 ENCOUNTER — Other Ambulatory Visit: Payer: Self-pay | Admitting: Family Medicine

## 2019-01-30 ENCOUNTER — Encounter: Payer: Self-pay | Admitting: Family Medicine

## 2019-01-30 DIAGNOSIS — E039 Hypothyroidism, unspecified: Secondary | ICD-10-CM

## 2019-01-30 MED ORDER — LEVOTHYROXINE SODIUM 112 MCG PO TABS: 112.0000 ug | ORAL_TABLET | Freq: Every day | ORAL | 3 refills | Status: DC

## 2019-01-30 MED ORDER — FLUTICASONE-UMECLIDIN-VILANT 100-62.5-25 MCG/INH IN AEPB: 1.0000 | INHALATION_SPRAY | Freq: Every day | RESPIRATORY_TRACT | 3 refills | Status: DC

## 2019-02-05 ENCOUNTER — Encounter: Payer: Self-pay | Admitting: Internal Medicine

## 2019-02-05 ENCOUNTER — Other Ambulatory Visit: Payer: Self-pay

## 2019-02-05 ENCOUNTER — Ambulatory Visit (INDEPENDENT_AMBULATORY_CARE_PROVIDER_SITE_OTHER): Payer: Medicare Other | Admitting: Internal Medicine

## 2019-02-05 ENCOUNTER — Telehealth: Payer: Self-pay | Admitting: Internal Medicine

## 2019-02-05 VITALS — BP 124/76 | HR 79 | Temp 98.2°F | Ht 67.0 in | Wt 179.0 lb

## 2019-02-05 DIAGNOSIS — J449 Chronic obstructive pulmonary disease, unspecified: Secondary | ICD-10-CM

## 2019-02-05 MED ORDER — METHYLPREDNISOLONE ACETATE 80 MG/ML IJ SUSP
120.0000 mg | Freq: Once | INTRAMUSCULAR | Status: AC
  Administered 2019-02-05: 120 mg via INTRAMUSCULAR

## 2019-02-05 NOTE — Progress Notes (Signed)
Subjective:    Patient ID: [name], female   DOB: 1940/12/21   MRN: [id number]      Brief patient profile:  89 yowf outpt director at [employer] who quit smoking 2006 with L empyema and never completely  recovered activity tol eval by [facility] and [facility] with polycthemia assoc with  noct desat min copd >  2lpm x hs only x 2017 per [facility] but requested transfer of care to [place] so self referred to pulmonary clinic 06/29/2017        History of Present Illness  06/29/2017 1st [facility] office visit/ Billi Bright   Chief Complaint  Patient presents with  . Pulmonary Consult    Self referral. She has seen Dr. Lamonte Sakai in the past- last in 2012. She has been seeing Dr. Corrin Parker at [facility] and is here today to re-est care. She c/o DOE with cleaning her house or taking out the trash. She is on O2 with sleep at 2lpm. She uses proiar 1 x per wk on average and has a neb with albuterol that she rarely uses.   doe x [facility] = can't walk a nl pace on a flat grade s sob but does fine slow and flat eg shopping at slow pace / can't do ramps Doe Never changed since empyema despite having RT and chemo for BAC dx 09/14/09 at [facility] with [facility] 11/2009 and persistent bilateral MPN's under surveillance at [facility] but no recent bx's or further chemo.  Uses  advair 250 twice daily ? Benefit > saba doesn't really help any of her symptoms/ no signifcant chronic or assoc  Coughing. rec Plan A = Automatic = bevespi Take 2 puffs first thing in am and then another 2 puffs about 12 hours later.  Work on inhaler technique: Plan B = Backup Only use your albuterol as a rescue medication  Plan C = Crisis - only use your albuterol nebulizer if you first try Plan B and it fails to help > ok to use the nebulizer up to every 4 hours but if start needing it regularly call for immediate appointment    08/10/2017  f/u ov/Gabriel Conry re: GOLD II COPD  Chief Complaint  Patient presents with  . Follow-up    Breathing is doing  well.  She uses her albuterol inhaler and neb rarely.    doe improved to [facility] = can walk nl pace, flat grade, can't hurry or go uphills or steps s sob   No noct symtoms   rec Continue the Bevespi Take 2 puffs first thing in am and then another 2 puffs about 12 hours later.          07/19/2018  f/u ov/Jayne Peckenpaugh re: copd gold II / nasal congestion main concern  Chief Complaint  Patient presents with  . Follow-up    Breathing is unchanged since last OV. PFT completed today.  Dyspnea:  MMRC2 = can't walk a nl pace on a flat grade s sob but does fine slow and flat / limited by back pain more than breathing  Cough: min in am/ attributes to pnds some better with otc's ? Not sure names  Sleeping: bed flat / on side/ 2 pllows SABA use: 2-3 x weekly 02:  2lpm hs rec No change in your medications  Work on inhaler technique:   schedule sinus ct >>  07/22/18 No definite abnormality seen in maxillofacial region.     02/05/2019 acute extended ov/Haivyn Oravec re: sob / gold II on  bevespi  maint Chief Complaint  Patient presents with  . Acute Visit    SOB off and on since March 2020- she was sick in Feb 2020 also with fever when returned home from [place]. She has some sore throat. She has had fever 5-6 days ago.   always has R nasal congestion x decades  On antihisatamine/deoncongestant taking twice daily x years Flew to [place] in Feb 2020 felt was coming down with uti (incont/hematuria/feverish)  rx macodantin then cipro > def improvement but still filling feverish/weak  so flew home week early new increased sob /coughing more > saw [name] / [name] NP and then [facility] phone care "just felt sick"  rx zpak / prednisone and back to baseline for only a day then feverish again with sweats but no chills and worse sob but no cough until nasal flare 02/01/19 > rx  Keflex then  levaquin   Fever is gone / minimal cough still slt green esp in am  When walking 02 always goes up  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   Sleeping ok now  without nocturnal  or early am exacerbation  of respiratory  c/o's or need for noct saba. Also denies any obvious fluctuation of symptoms with weather or environmental changes or other aggravating or alleviating factors except as outlined above   No unusual exposure hx or h/o childhood pna/ asthma or knowledge of premature birth.  Current Allergies, Complete Past Medical History, Past Surgical History, Family History, and Social History were reviewed in [employer] record.  ROS  The following are not active complaints unless bolded Hoarseness, sore throat, dysphagia, dental problems, itching, sneezing,  nasal congestion or discharge of excess mucus or purulent secretions, ear ache,   fever, chills, sweats, unintended wt loss or wt gain, classically pleuritic or exertional cp,  orthopnea pnd or arm/hand swelling  or leg swelling, presyncope, palpitations, abdominal pain, anorexia, nausea, vomiting, diarrhea  or change in bowel habits or change in bladder habits, change in stools or change in urine, dysuria, hematuria,  rash, arthralgias, visual complaints, headache, numbness, weakness or ataxia or problems with walking or coordination,  change in mood=anxiety or  memory.        Current Meds  Medication Sig  . acetaminophen (TYLENOL) 325 MG tablet Take by mouth.  Marland Kitchen albuterol (PROAIR HFA) 108 (90 Base) MCG/ACT inhaler Inhale 1 puff into the lungs every 6 (six) hours as needed for wheezing or shortness of breath.  Marland Kitchen albuterol (PROVENTIL) (2.5 MG/3ML) 0.083% nebulizer solution USE 1 VIAL (3 ML OR 2.5 MG TOTAL) VIA NEBULIZER EVERY 4 HOURS AS NEEDED FOR WHEEZING OR SHORTNESS OF BREATH   . ALPRAZolam (XANAX) 0.5 MG tablet TAKE 1 TABLET BY MOUTH EVERY DAY AT BEDTIME AS NEEDED ANXIETY OR INSOMNIA  . b complex-C-folic acid 1 MG capsule Take 1 capsule by mouth daily.    Marland Kitchen BEVESPI AEROSPHERE 9-4.8 MCG/ACT AERO USE 2 INHALATIONS TWICE A DAY  . buPROPion (WELLBUTRIN XL) 150 MG 24 hr tablet TAKE 1 TABLET DAILY  . Calcium Carbonate-Vit D-Min (CALCIUM 1200 PO) Take by mouth.  . Cyanocobalamin 1000 MCG CAPS Take by mouth.  Marland Kitchen  doxazosin (CARDURA) 4 MG tablet Take 1 tablet (4 mg total) by mouth daily.  . DULoxetine (CYMBALTA) 60 MG capsule TAKE 1 CAPSULE DAILY  . fluticasone (FLONASE) 50 MCG/ACT nasal spray Place 2 sprays into both nostrils daily.  . folic acid (FOLVITE) 1 MG tablet Take by mouth.  . levothyroxine (SYNTHROID) 112 MCG tablet Take 1 tablet (112 mcg total) by mouth daily.  Marland Kitchen losartan-hydrochlorothiazide (HYZAAR) 100-25 MG tablet Take 1 tablet by mouth daily.  Marland Kitchen nystatin (MYCOSTATIN) 100000 UNIT/ML suspension Take 5 mLs (500,000 Units total) by mouth 4 (four) times daily. Swish and swallow.  Marland Kitchen omeprazole (PRILOSEC) 20 MG capsule TAKE 1 CAPSULE DAILY  . OXYGEN 2lpm with sleep  . pravastatin (PRAVACHOL) 40 MG tablet TAKE 1 TABLET DAILY (STOP SIMVASTATIN)  . RESTASIS 0.05 % ophthalmic emulsion INT 1 GTT INTO OU BID  . traZODone (DESYREL) 50 MG tablet TAKE 1 TABLET AT BEDTIME AS NEEDED FOR SLEEP  . VITAMIN D, CHOLECALCIFEROL, PO Take 1 tablet by mouth daily.                Objective:   Physical Exam    amb wf not acutely ill appearing and speaking in full sentences  but  very anxious who  failed to answer a single question asked in a straightforward manner, tending to go off on tangents or answer questions with ambiguous medical terms or diagnoses and seemed perplexed  when asked the same question more than once for clarification and focused on irrelevant finds to date on her w/u "well what about my low expired C02 - it means I'm not perfusing well"  And "what about my  elevated potassium level"    Vital signs reviewed - Note on arrival 02 sats  96% on RA      02/05/2019       179  07/19/2018       173  06/19/2018       175  08/10/2017     170   06/29/17 166 lb (75.3 kg)  05/28/17 164 lb (74.4 kg)  05/03/17 166 lb (75.3 kg)     HEENT: nl dentition / oropharynx. Nl external ear canals without cough reflex -  Mild/mod bilateral non-specific turbinate edema     NECK :  without JVD/Nodes/TM/ nl carotid upstrokes bilaterally   LUNGS: no acc muscle use,  Mild barrel  contour chest wall with bilateral  Distant bs s audible wheeze and  without cough on insp or exp maneuver and mild  Hyperresonant  to  percussion bilaterally     CV:  RRR  no s3 or murmur or increase in P2, and no edema   ABD:  soft and nontender with pos late  insp Hoover's  in the supine position. No bruits or organomegaly appreciated, bowel sounds nl  MS:   Nl gait/  ext warm without deformities, calf tenderness, cyanosis or clubbing No obvious joint restrictions   SKIN: warm and dry without lesions    NEURO:  alert, approp, nl sensorium with  no motor or cerebellar deficits apparent.        Covid 19 PCR 01/13/19 neg   I personally reviewed images and agree with radiology impression as follows:  CXR:  01/28/2019 Chronic changes on the left consistent with the given clinical history. No acute abnormality is noted.   Labs   reviewed:      Chemistry      Component Value Date/Time   NA 137 01/28/2019 1008   K  5.3 01/28/2019 1008   CL 97 (L) 01/28/2019 1008   CO2 29 01/28/2019 1008   BUN 29 (H) 01/28/2019 1008   CREATININE 0.83 01/28/2019 1008      Component Value Date/Time   CALCIUM 10.0 01/28/2019 1008   ALKPHOS 67 11/03/2016 1136   AST 24 01/28/2019 1008   ALT 19 01/28/2019 1008   BILITOT 0.4 01/28/2019 1008        Lab Results  Component Value Date   WBC 10.1 01/28/2019   HGB 14.4 01/28/2019   HCT 42.0 01/28/2019   MCV 102.4 (H) 01/28/2019   PLT 267  01/28/2019       EOS                                                              293                                    01/28/2019  Lab Results  Component Value Date   DDIMER 0.33 01/28/2019      Lab Results  Component Value Date   TSH 4.56 (H) 01/28/2019             Assessment:

## 2019-02-05 NOTE — Patient Instructions (Addendum)
Call me when you get you home with the name of nose pill  Change prilosec 20 mg Take 30- 60 min before your first and last meals of the day   GERD (REFLUX)  is an extremely common cause of respiratory symptoms just like yours , many times with no obvious heartburn at all.    It can be treated with medication, but also with lifestyle changes including elevation of the head of your bed (ideally with 6 -8inch blocks under the headboard of your bed),  Smoking cessation, avoidance of late meals, excessive alcohol, and avoid fatty foods, chocolate, peppermint, colas, red wine, and acidic juices such as orange juice.  NO MINT OR MENTHOL PRODUCTS SO NO COUGH DROPS  USE SUGARLESS CANDY INSTEAD (Jolley ranchers or Stover's or Life Savers) or even ice chips will also do - the key is to swallow to prevent all throat clearing. NO OIL BASED VITAMINS - use powdered substitutes.  Avoid fish oil when coughing.    Depomedrol 120 mg IM and continue Bevespi Take 2 puffs first thing in am and then another 2 puffs about 12 hours later.     Please schedule a follow up office visit in 4 weeks, sooner if needed  with all medications /inhalers/ solutions in hand so we can verify exactly what you are taking. This includes all medications from all doctors and over the counters

## 2019-02-07 ENCOUNTER — Encounter: Payer: Self-pay | Admitting: Internal Medicine

## 2019-02-07 ENCOUNTER — Telehealth (HOSPITAL_COMMUNITY): Payer: Self-pay | Admitting: Cardiology

## 2019-02-07 NOTE — Assessment & Plan Note (Signed)
Quit smoking 2006 PFT's  08/07/11  FEV1 2.14 (89 % ) ratio 69  with DLCO  73 % corrects to 108 % for alv volume  - Spirometry 06/29/2017  FEV1 1.24 (52%)  Ratio 64 on advair -  Allergy profile 06/29/17  >  Eos 0.2/  IgE  13 RAST neg  - 06/29/2017  try bevespi  2bid > improved as of 08/10/18  -  Spirometry 06/19/2018  FEV1 1.4 (57%)  Ratio 64 p bevespi x 2 pffs   - PFT's  07/19/2018  FEV1 1.63  (74 % ) ratio 68  p 13 % improvement from saba p nothing prior to study with DLCO  71/70c % corrects to 93 % for alv volume    - 02/05/2019  After extensive coaching inhaler device,  effectiveness =    90%> continue bevespi    DDX of  difficult airways management almost all start with A and  include Adherence, Ace Inhibitors, Acid Reflux, Active Sinus Disease, Alpha 1 Antitripsin deficiency, Anxiety masquerading as Airways dz,  ABPA,  Allergy(esp in young), Aspiration (esp in elderly), Adverse effects of meds,  Active smoking or vaping, A bunch of PE's (a small clot burden can't cause this syndrome unless there is already severe underlying pulm or vascular dz with poor reserve) plus two Bs  = Bronchiectasis and Beta blocker use..and one C= CHF    Adherence is always the initial "prime suspect" and is a multilayered concern that requires a "trust but verify" approach in every patient - starting with knowing how to use medications, especially inhalers, correctly, keeping up with refills and understanding the fundamental difference between maintenance and prns vs those medications only taken for a very short course and then stopped and not refilled.  - see hfa teaching  - return with all meds in hand using a trust but verify approach to confirm accurate Medication  Reconciliation The principal here is that until we are certain that the  patients are doing what we've asked, it makes no sense to ask them to do more.    ? Acid (or non-acid) GERD > always difficult to exclude as up to 75% of pts in some series report  no assoc GI/ Heartburn symptoms> rec max (24h)  acid suppression and diet restrictions/ reviewed and instructions given in writing.  - Of the three most common causes of  Sub-acute / recurrent or chronic cough, only one (GERD)  can actually contribute to/ trigger  the other two (asthma and post nasal drip syndrome)  and perpetuate the cylce of cough.  While not intuitively obvious, many patients with chronic low grade reflux do not cough until there is a primary insult that disturbs the protective epithelial barrier and exposes sensitive nerve endings.   This is typically viral but can due to PNDS and  either may apply here.    >>>The point is that once this occurs, it is difficult to eliminate the cycle  using anything but a maximally effective acid suppression regimen at least in the short run, accompanied by an appropriate diet to address non acid GERD and control / eliminate  Any inflammatory component with Depomedrol 120 mg IM   ? Anxiety > usually at the bottom of this list of usual suspects but should be much higher on this pt's based on H and P and note already on psychotropics and may interfere with adherence and also interpretation of response or lack thereof to symptom management which can be quite subjective.   ?  Allergy/ asthma component >  depomedrol 120 mg IM   ? Active sinus dz > flonase / otc's reviewed: note last time had similar sympotms 07/2018 neg sinus ct so no need to repeat > ent eval prn     I had an extended discussion with the patient reviewing all relevant studies completed to date and  lasting 25 minutes of a 40  minute acute office visit addressing  severe non-specific but potentially very serious refractory respiratory symptoms of uncertain and potentially multiple  etiologies.   Each maintenance medication was reviewed in detail including most importantly the difference between maintenance and prns and under what circumstances the prns are to be triggered using an  action plan format that is not reflected in the computer generated alphabetically organized AVS.    Please see AVS for specific instructions unique to this office visit that I personally wrote and verbalized to the the pt in detail and then reviewed with pt  by my nurse highlighting any changes in therapy/plan of care  recommended at today's visit.

## 2019-02-07 NOTE — Telephone Encounter (Signed)
Called and spoke with pt. Pt stated the name of the pill that MW was needing her to call back to let him know what she took is called Wal-phed Pe Nasal decongestion severe sinus and pain. Pt stated that she takes 1 tablet at night.  I have updated pt's med list with the Wal phed pe tablet and the instructions on when she takes it and the dose that she takes. Routing to MW as an Juluis Rainier in regards to this medication. Nothing further needed.

## 2019-02-10 ENCOUNTER — Ambulatory Visit (HOSPITAL_COMMUNITY): Payer: Medicare Other | Attending: Cardiology

## 2019-02-10 ENCOUNTER — Other Ambulatory Visit: Payer: Self-pay

## 2019-02-10 DIAGNOSIS — R0609 Other forms of dyspnea: Secondary | ICD-10-CM | POA: Diagnosis not present

## 2019-02-10 DIAGNOSIS — R0600 Dyspnea, unspecified: Secondary | ICD-10-CM

## 2019-02-11 NOTE — Telephone Encounter (Signed)
Left message

## 2019-02-13 ENCOUNTER — Encounter: Payer: Self-pay | Admitting: Family Medicine

## 2019-02-13 ENCOUNTER — Other Ambulatory Visit: Payer: Self-pay

## 2019-02-13 ENCOUNTER — Ambulatory Visit (INDEPENDENT_AMBULATORY_CARE_PROVIDER_SITE_OTHER): Payer: Medicare Other | Admitting: Family Medicine

## 2019-02-13 VITALS — BP 132/80 | HR 90 | Temp 98.9°F | Resp 18 | Ht 65.0 in | Wt 178.6 lb

## 2019-02-13 DIAGNOSIS — J449 Chronic obstructive pulmonary disease, unspecified: Secondary | ICD-10-CM

## 2019-02-13 DIAGNOSIS — N390 Urinary tract infection, site not specified: Secondary | ICD-10-CM | POA: Diagnosis not present

## 2019-02-13 DIAGNOSIS — R319 Hematuria, unspecified: Secondary | ICD-10-CM | POA: Diagnosis not present

## 2019-02-13 LAB — URINALYSIS, ROUTINE W REFLEX MICROSCOPIC
Bilirubin Urine: NEGATIVE
Glucose, UA: NEGATIVE
Hyaline Cast: NONE SEEN /LPF
Nitrite: NEGATIVE
Specific Gravity, Urine: 1.024 (ref 1.001–1.03)
pH: 6 (ref 5.0–8.0)

## 2019-02-13 LAB — MICROSCOPIC MESSAGE

## 2019-02-13 MED ORDER — CEPHALEXIN 500 MG PO CAPS: 500.0000 mg | ORAL_CAPSULE | Freq: Three times a day (TID) | ORAL | 0 refills | Status: DC

## 2019-02-13 NOTE — Progress Notes (Signed)
   Subjective:    Patient ID: [name], female    DOB: [birth year], 78 y.o.   MRN: [id number]  Patient presents for Hematuria (started 06/01, polyuria, no pain)   Pt here with UTI symptoms- Monday started with dysuria, hematuria where she had gross blood dripping, has had UTI in past, but not a recurrent ones. No documented fever, no chills   Chronic SOB being worked up, reviewed recent ECHO, nothing major found, no heart failure, seems to be all pulmonary working with Dr. Melvyn Novas, she wants portable oxygen, advised to contact their office, she is on concentrator at bedtime right now Using inhalers as prescribed      Review Of Systems:  GEN- denies fatigue, fever, weight loss,weakness, recent illness HEENT- denies eye drainage, change in vision, nasal discharge, CVS- denies chest pain, palpitations RESP- + SOB, denies cough, wheeze ABD- denies N/V, change in stools, abd pain GU- + dysuria,+ hematuria, dribbling, incontinence MSK- denies joint pain, muscle aches, injury Neuro- denies headache, dizziness, syncope, seizure activity       Objective:    BP 132/80   Pulse 90   Temp 98.9 F (37.2 C)   Resp 18   Ht 5\' 5"  (1.651 m)   Wt 178 lb 9.6 oz (81 kg)   SpO2 96%   BMI 29.72 kg/m  GEN- NAD, alert and oriented x3 HEENT- PERRL, EOMI, non injected sclera, pink conjunctiva, MMM, oropharynx clear Neck- Supple, no JVD  CVS- RRR, no murmur RESP-clear but increased WOB with talking but talking in full sentences, gets worked up, had some diaphoresis initially, able to get her calm, oxygen sat normal, no bronchospasm heard  ABD-NABS,soft,NT,ND, no CVA tenderness EXT- No edema Pulses- Radial  2+        Assessment & Plan:      Problem List Items Addressed This Visit      Unprioritized   COPD GOLD II    Chronic hypoxia, history of lung cancer Chronic SOB, worsened since December per pt Nothing acute found thus far She gets very anxious and worked up and breathing  gets worse, once she calms , and honestly stops rambling a lot, her breathing is improved.  Recommend she call her lung doctor about this specific oxygen tank she wants, I will send a message as well that she has had difficulty getting her request to him       Other Visit Diagnoses    Hematuria, unspecified type    -  Primary   Relevant Orders   Urinalysis, Routine w reflex microscopic (Completed)   Urinary tract infection with hematuria, site unspecified       Treat with keflex , did not have enough for culture. Recheck UA/Culture in a couple of weeks   Relevant Medications   cephALEXin (KEFLEX) 500 MG capsule      Note: This dictation was prepared with Dragon dictation along with smaller phrase technology. Any transcriptional errors that result from this process are unintentional.

## 2019-02-13 NOTE — Assessment & Plan Note (Signed)
Chronic hypoxia, history of lung cancer Chronic SOB, worsened since December per pt Nothing acute found thus far She gets very anxious and worked up and breathing gets worse, once she calms , and honestly stops rambling a lot, her breathing is improved.  Recommend she call her lung doctor about this specific oxygen tank she wants, I will send a message as well that she has had difficulty getting her request to him

## 2019-02-13 NOTE — Patient Instructions (Signed)
F/u 2 WEEKS -Lab visit- for urine sample

## 2019-02-14 ENCOUNTER — Encounter: Payer: Self-pay | Admitting: Family Medicine

## 2019-02-27 ENCOUNTER — Other Ambulatory Visit: Payer: Self-pay

## 2019-02-27 ENCOUNTER — Encounter: Payer: Self-pay | Admitting: Family Medicine

## 2019-02-27 ENCOUNTER — Ambulatory Visit
Admission: RE | Admit: 2019-02-27 | Discharge: 2019-02-27 | Disposition: A | Discharge status: Home / Self Care | Payer: Medicare Other | Source: Ambulatory Visit | Attending: Family Medicine | Admitting: Family Medicine

## 2019-02-27 DIAGNOSIS — Z1231 Encounter for screening mammogram for malignant neoplasm of breast: Secondary | ICD-10-CM

## 2019-02-28 ENCOUNTER — Other Ambulatory Visit: Payer: Medicare Other

## 2019-02-28 DIAGNOSIS — N390 Urinary tract infection, site not specified: Secondary | ICD-10-CM

## 2019-02-28 DIAGNOSIS — R319 Hematuria, unspecified: Secondary | ICD-10-CM

## 2019-02-28 LAB — URINALYSIS, ROUTINE W REFLEX MICROSCOPIC
Bilirubin Urine: NEGATIVE
Glucose, UA: NEGATIVE
Hgb urine dipstick: NEGATIVE
Ketones, ur: NEGATIVE
Leukocytes,Ua: NEGATIVE
Nitrite: NEGATIVE
Protein, ur: NEGATIVE
Specific Gravity, Urine: 1.025 (ref 1.001–1.03)
pH: 6 (ref 5.0–8.0)

## 2019-03-02 ENCOUNTER — Encounter: Payer: Self-pay | Admitting: Family Medicine

## 2019-03-02 LAB — URINE CULTURE
MICRO NUMBER:: 588328
SPECIMEN QUALITY:: ADEQUATE

## 2019-03-10 ENCOUNTER — Other Ambulatory Visit: Payer: Self-pay

## 2019-03-10 ENCOUNTER — Encounter: Payer: Self-pay | Admitting: Internal Medicine

## 2019-03-10 ENCOUNTER — Ambulatory Visit (INDEPENDENT_AMBULATORY_CARE_PROVIDER_SITE_OTHER): Payer: Medicare Other | Admitting: Internal Medicine

## 2019-03-10 DIAGNOSIS — R0609 Other forms of dyspnea: Secondary | ICD-10-CM

## 2019-03-10 DIAGNOSIS — R0600 Dyspnea, unspecified: Secondary | ICD-10-CM

## 2019-03-10 DIAGNOSIS — J449 Chronic obstructive pulmonary disease, unspecified: Secondary | ICD-10-CM | POA: Diagnosis not present

## 2019-03-10 MED ORDER — OMEPRAZOLE 20 MG PO CPDR: 20.0000 mg | DELAYED_RELEASE_CAPSULE | Freq: Two times a day (BID) | ORAL | 3 refills | Status: DC

## 2019-03-10 MED ORDER — BUDESONIDE-FORMOTEROL FUMARATE 160-4.5 MCG/ACT IN AERO: 2.0000 | INHALATION_SPRAY | Freq: Two times a day (BID) | RESPIRATORY_TRACT | 11 refills | Status: DC

## 2019-03-10 MED ORDER — METHYLPREDNISOLONE ACETATE 80 MG/ML IJ SUSP
120.0000 mg | Freq: Once | INTRAMUSCULAR | Status: AC
  Administered 2019-03-10: 120 mg via INTRAMUSCULAR

## 2019-03-10 MED ORDER — BUDESONIDE-FORMOTEROL FUMARATE 160-4.5 MCG/ACT IN AERO: 2.0000 | INHALATION_SPRAY | Freq: Two times a day (BID) | RESPIRATORY_TRACT | 0 refills | Status: DC

## 2019-03-10 NOTE — Patient Instructions (Signed)
Change prilosec 20 mg Take 30- 60 min before your first and last meals of the day    Depomedrol 120 mg IM    Plan A = Automatic = symbicort 160 Take 2 puffs first thing in am and then another 2 puffs about 12 hours later.   Work on inhaler technique:  relax and gently blow all the way out then take a nice smooth deep breath back in, triggering the inhaler at same time you start breathing in.  Hold for up to 5 seconds if you can. Blow out thru nose. Rinse and gargle with water when done    Plan B = Backup Only use your albuterol inhaler as a rescue medication to be used if you can't catch your breath by resting or doing a relaxed purse lip breathing pattern.  - The less you use it, the better it will work when you need it. - Ok to use the inhaler up to 2 puffs  every 4 hours if you must but call for appointment if use goes up over your usual need - Don't leave home without it !!  (think of it like the spare tire for your car)   Plan C = Crisis - only use your albuterol nebulizer if you first try Plan B and it fails to help > ok to use the nebulizer up to every 4 hours but if start needing it regularly call for immediate appointment    Please schedule a follow up office visit in 4 weeks, sooner if needed  with all medications /inhalers/ solutions in hand so we can verify exactly what you are taking. This includes all medications from all doctors and over the counters

## 2019-03-10 NOTE — Progress Notes (Signed)
Subjective:    Patient ID: [name], female   DOB: 1941-05-10   MRN: [id number]      Brief patient profile:  4 yowf outpt director at [employer] who quit smoking 2006 with L empyema and never completely  recovered activity tol eval by Byrum and Gover with polycthemia assoc with  noct desat min copd >  2lpm x hs only x 2017 per Govert but requested transfer of care to [place] so self referred to pulmonary clinic 06/29/2017        History of Present Illness  06/29/2017 1st  Pulmonary office visit/ Ringo Sherod   Chief Complaint  Patient presents with  . Pulmonary Consult    Self referral. She has seen Dr. Lamonte Sakai in the past- last in 2012. She has been seeing Dr. Corrin Parker at [facility] and is here today to re-est care. She c/o DOE with cleaning her house or taking out the trash. She is on O2 with sleep at 2lpm. She uses proiar 1 x per wk on average and has a neb with albuterol that she rarely uses.   doe x [facility] = can't walk a nl pace on a flat grade s sob but does fine slow and flat eg shopping at slow pace / can't do ramps Doe Never changed since empyema despite having RT and chemo for BAC dx 09/14/09 at [facility] with [facility] 11/2009 and persistent bilateral MPN's under surveillance at [facility] but no recent bx's or further chemo.  Uses  advair 250 twice daily ? Benefit > saba doesn't really help any of her symptoms/ no signifcant chronic or assoc  Coughing. rec Plan A = Automatic = bevespi Take 2 puffs first thing in am and then another 2 puffs about 12 hours later.  Work on inhaler technique: Plan B = Backup Only use your albuterol as a rescue medication  Plan C = Crisis - only use your albuterol nebulizer if you first try Plan B and it fails to help > ok to use the nebulizer up to every 4 hours but if start needing it regularly call for immediate appointment    08/10/2017  f/u ov/[name] re: GOLD II COPD  Chief Complaint  Patient presents with  . Follow-up    Breathing is doing  well.  She uses her albuterol inhaler and neb rarely.    doe improved to [facility] = can walk nl pace, flat grade, can't hurry or go uphills or steps s sob   No noct symtoms   rec Continue the Bevespi Take 2 puffs first thing in am and then another 2 puffs about 12 hours later.          07/19/2018  f/u ov/[name] re: copd gold II / nasal congestion main concern  Chief Complaint  Patient presents with  . Follow-up    Breathing is unchanged since last OV. PFT completed today.  Dyspnea:  MMRC2 = can't walk a nl pace on a flat grade s sob but does fine slow and flat / limited by back pain more than breathing  Cough: min in am/ attributes to pnds some better with otc's ? Not sure names  Sleeping: bed flat / on side/ 2 pllows SABA use: 2-3 x weekly 02:  2lpm hs rec No change in your medications  Work on inhaler technique:   schedule sinus ct >>  07/22/18 No definite abnormality seen in maxillofacial region.     02/05/2019 acute extended ov/[name] re: sob / gold II on  bevespi  maint Chief Complaint  Patient presents with  . Acute Visit    SOB off and on since March 2020- she was sick in Feb 2020 also with fever when returned home from [place]. She has some sore throat. She has had fever 5-6 days ago.   always has R nasal congestion x decades  On antihisatamine/deoncongestant taking twice daily x years Flew to [place] in Feb 2020 felt was coming down with uti (incont/hematuria/feverish)  rx macodantin then cipro > def improvement but still filling feverish/weak  so flew home week early new increased sob /coughing more > saw [name] / [name] NP and then [facility] phone care "just felt sick"  rx zpak / prednisone and back to baseline for only a day then feverish again with sweats but no chills and worse sob but no cough until nasal flare 02/01/19 > rx  Keflex then  levaquin  Fever is gone / minimal cough still slt green esp in am  When walking 02 always goes up  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  Please schedule a follow up office visit in 4 weeks, sooner if needed  with all medications /inhalers/ solutions in hand so we can verify exactly what you are taking. This includes all medications from all doctors and over the counters     03/10/2019  f/u ov/[name] re: did not remember meds - no longer using rescue inhaler or neb  Chief Complaint  Patient presents with  . Follow-up    Breathing has improved some.   Dyspnea:  Maybe 100 ft to mb and has to stop half way x years/ minimal incline / better p depomedrol then back to baseline  Cough: no cough/ still sense of throat congestion Sleeping: 2 pillows  SABA use:  02: 2lpm hs  Nose gets stopped up w/in a few min of being outdoors despite neg allergy profile    No obvious day to day or daytime variability or assoc excess/ purulent sputum or mucus plugs or hemoptysis or cp or chest tightness, subjective wheeze or overt sinus or hb symptoms.   Sleeping resp wise ok  without nocturnal  or early am exacerbation  of respiratory  c/o's or need for noct saba. Also denies any obvious fluctuation of symptoms with weather or environmental changes or other aggravating or alleviating factors except as outlined above   No unusual exposure hx or h/o childhood pna/ asthma or knowledge of premature birth.  Current Allergies, Complete Past Medical History, Past Surgical History, Family History, and Social History were reviewed in [employer] record.  ROS  The following are not active complaints unless bolded Hoarseness, sore throat,  dysphagia, dental problems, itching, sneezing,  nasal congestion or discharge of excess mucus or purulent secretions, ear ache,   fever, chills, sweats, unintended wt loss or wt gain, classically pleuritic or exertional cp,  orthopnea pnd or arm/hand swelling  or leg swelling, presyncope, palpitations, abdominal pain, anorexia, nausea, vomiting, diarrhea  or change in bowel  habits or change in bladder habits, change in stools or change in urine, dysuria, hematuria,  rash, arthralgias, visual complaints, headache, numbness, weakness or ataxia or problems with walking or coordination,  change in mood or  memory.        Current Meds  Medication Sig  . acetaminophen (TYLENOL) 325 MG tablet Take by mouth.  Marland Kitchen albuterol (PROAIR HFA) 108 (90 Base) MCG/ACT inhaler Inhale 1 puff into the lungs every 6 (six) hours as needed for wheezing or shortness of breath.  Marland Kitchen albuterol (PROVENTIL) (2.5 MG/3ML) 0.083% nebulizer solution USE 1 VIAL (3 ML OR 2.5 MG TOTAL) VIA NEBULIZER EVERY 4 HOURS AS NEEDED FOR WHEEZING OR SHORTNESS OF BREATH  . ALPRAZolam (XANAX) 0.5 MG tablet TAKE 1 TABLET BY MOUTH EVERY DAY AT BEDTIME AS NEEDED ANXIETY OR INSOMNIA  . amphetamine-dextroamphetamine (ADDERALL XR) 15 MG 24 hr capsule Take 1 capsule by mouth every morning.  Marland Kitchen b complex-C-folic acid 1 MG capsule Take 1 capsule by mouth daily.    Marland Kitchen BEVESPI AEROSPHERE 9-4.8 MCG/ACT AERO USE 2 INHALATIONS TWICE A DAY  . buPROPion (WELLBUTRIN XL) 150 MG 24 hr tablet TAKE 1 TABLET DAILY  . Calcium Carbonate-Vit D-Min (CALCIUM 1200 PO) Take by mouth.  . Cyanocobalamin 1000 MCG CAPS Take by mouth.  . doxazosin (CARDURA) 4 MG tablet Take 1 tablet (4 mg total) by mouth daily.  . DULoxetine (CYMBALTA) 60 MG capsule TAKE 1 CAPSULE DAILY  . fluticasone (FLONASE) 50 MCG/ACT nasal spray Place 2 sprays into both nostrils daily.  . folic acid (FOLVITE) 1 MG tablet Take by mouth.  . levothyroxine (SYNTHROID) 112 MCG tablet Take 1 tablet (112 mcg total) by  mouth daily.  Marland Kitchen losartan-hydrochlorothiazide (HYZAAR) 100-25 MG tablet Take 1 tablet by mouth daily.  Marland Kitchen nystatin (MYCOSTATIN) 100000 UNIT/ML suspension Take 5 mLs (500,000 Units total) by mouth 4 (four) times daily. Swish and swallow.  Marland Kitchen omeprazole (PRILOSEC) 20 MG capsule TAKE 1 CAPSULE DAILY  . OXYGEN 2lpm with sleep  . Phenylephrine HCl (WAL-PHED PE PO) Take 1 tablet by mouth at bedtime.  . pravastatin (PRAVACHOL) 40 MG tablet TAKE 1 TABLET DAILY (STOP SIMVASTATIN)  . RESTASIS 0.05 % ophthalmic emulsion INT 1 GTT INTO OU BID  . traZODone (DESYREL) 50 MG tablet TAKE 1 TABLET AT BEDTIME AS NEEDED FOR SLEEP  . VITAMIN D, CHOLECALCIFEROL, PO Take 1 tablet by mouth daily.              Objective:   Physical Exam    amb wf non-stop rambling but very coherent/ intelligent    03/10/2019       179 02/05/2019       179  07/19/2018       173  06/19/2018       175  08/10/2017     170   06/29/17 166 lb (75.3 kg)  05/28/17 164 lb (74.4 kg)  05/03/17 166 lb (75.3 kg)      HEENT: nl dentition / oropharynx. Nl external ear canals without cough reflex -  Mild/mod bilateral non-specific turbinate edema     NECK :  without JVD/Nodes/TM/ nl carotid upstrokes bilaterally   LUNGS: no acc muscle use,  Mild barrel  contour chest wall with bilateral  Distant bs s audible wheeze and  without cough on insp or exp maneuver and mild  Hyperresonant  to  percussion bilaterally     CV:  RRR  no s3 or murmur or increase in P2, and no edema  ABD:  soft and nontender with pos end  insp Hoover's  in the supine position. No bruits or organomegaly appreciated, bowel sounds nl  MS:   Nl gait/  ext warm without deformities, calf tenderness, cyanosis or clubbing No obvious joint restrictions   SKIN: warm and dry without lesions    NEURO:  alert, approp, nl sensorium with  no motor or cerebellar deficits apparent.             Assessment:

## 2019-03-11 ENCOUNTER — Encounter: Payer: Self-pay | Admitting: Internal Medicine

## 2019-03-11 NOTE — Assessment & Plan Note (Signed)
Quit smoking 2006 PFT's  08/07/11  FEV1 2.14 (89 % ) ratio 69  with DLCO  73 % corrects to 108 % for alv volume  - Spirometry 06/29/2017  FEV1 1.24 (52%)  Ratio 64 on advair -  Allergy profile 06/29/17  >  Eos 0.2/  IgE  13 RAST neg  - 06/29/2017  try bevespi  2bid > improved as of 08/10/18  -  Spirometry 06/19/2018  FEV1 1.4 (57%)  Ratio 64 p bevespi x 2 pffs   - PFT's  07/19/2018  FEV1 1.63  (74 % ) ratio 68  p 13 % improvement from saba p nothing prior to study with DLCO  71/70c % corrects to 93 % for alv volume    - 02/05/2019   continue bevespi   - 03/10/2019  After extensive coaching inhaler device,  effectiveness = 75% so changed to symbicort 160 as felt breathing better p steroids and no better on bevespi to mailbox x "years"     Not really clear to me how much of this component is asthmatic but it is reasonable to try her on Symbicort to see if she does better with symptom control including inability to go consistently to her mailbox without having to stop halfway.

## 2019-03-11 NOTE — Assessment & Plan Note (Signed)
06/29/2017  Walked RA x 3 laps @ 185 ft each stopped due to  End of study, fast pace, no desat, some sob at end  -  Allergy profile 06/29/17  >  Eos 0.2/  IgE  13 RAST neg  - 06/19/2018  Walked RA x 3 laps @ 185 ft each stopped due to  End of study, fast pace, no  desat  But sob on last lap -  03/10/2019   Walked RA  2 laps @ approx 225ft each @ avg  pace  stopped due to end of study, no desats, no sob   Her walking study today is not consistent with her history strongly suggesting either very poor insight into exercise physiology or a component of anxiety depression and deconditioning, and based on her evaluation both today and on previous visit I strongly favor the latter.  We can use however the symptoms she has walking to the mailbox and back to titrate her medications appropriately and advised her on why it is so important to do this.   I had an extended discussion with the patient   reviewing all relevant studies completed to date and  lasting 15 to 20 minutes of a 25 minute visit  which included directly observing ambulatory 02 saturation study documented in a/p section of  today's  office note.  See device teaching which also extended face to face time for this visit   Each maintenance medication was reviewed in detail including most importantly the difference between maintenance and prns and under what circumstances the prns are to be triggered using an action plan format that is not reflected in the computer generated alphabetically organized AVS.     Please see AVS for specific instructions unique to this visit that I personally wrote and verbalized to the the pt in detail and then reviewed with pt  by my nurse highlighting any changes in therapy recommended at today's visit .

## 2019-03-17 ENCOUNTER — Telehealth: Payer: Self-pay | Admitting: Internal Medicine

## 2019-03-17 NOTE — Telephone Encounter (Signed)
Received a fax from the [facility] not covered  They are recommending that she try advair  Per Dr.Wert- call in Advair 250/50 1 puff bid  Per pt, she has already tried this years ago and it did not help  She is asking why she can not just take the bevespi b/c it helped her breathing and was affordable to her  Please advise thanks!

## 2019-03-17 NOTE — Telephone Encounter (Signed)
Fine with me to first try bevespi and if not happy with it return with full drug formulary to regroup

## 2019-03-17 NOTE — Telephone Encounter (Signed)
Spoke with the pt and notified of recs per MW  She verbalized understanding 

## 2019-03-20 ENCOUNTER — Ambulatory Visit: Payer: Medicare Other | Admitting: Internal Medicine

## 2019-04-02 ENCOUNTER — Other Ambulatory Visit: Payer: Self-pay | Admitting: Family Medicine

## 2019-04-02 DIAGNOSIS — G510 Bell's palsy: Secondary | ICD-10-CM

## 2019-04-02 NOTE — Telephone Encounter (Signed)
Pt is requesting refill on Xanax   LOV: 01/28/19  LRF:   01/02/19

## 2019-04-03 ENCOUNTER — Other Ambulatory Visit: Payer: Self-pay | Admitting: *Deleted

## 2019-04-03 MED ORDER — NYSTATIN 100000 UNIT/ML MT SUSP: 5.0000 mL | Freq: Four times a day (QID) | OROMUCOSAL | 0 refills | Status: DC

## 2019-04-03 NOTE — Telephone Encounter (Signed)
Received call from patient.   Reports that she had dental surgery recently and now has ulcers in mouth.   Requested refill on Nystatin mouthwash.,,   Prescription sent to pharmacy.

## 2019-04-07 ENCOUNTER — Encounter: Payer: Self-pay | Admitting: Family Medicine

## 2019-04-08 MED ORDER — OMEPRAZOLE 20 MG PO CPDR: 20.0000 mg | DELAYED_RELEASE_CAPSULE | Freq: Two times a day (BID) | ORAL | 3 refills | Status: DC

## 2019-04-09 ENCOUNTER — Ambulatory Visit (INDEPENDENT_AMBULATORY_CARE_PROVIDER_SITE_OTHER): Payer: Medicare Other | Admitting: Internal Medicine

## 2019-04-09 ENCOUNTER — Other Ambulatory Visit: Payer: Self-pay

## 2019-04-09 ENCOUNTER — Encounter: Payer: Self-pay | Admitting: Internal Medicine

## 2019-04-09 VITALS — BP 116/68 | HR 81 | Temp 98.6°F | Ht 67.0 in | Wt 173.6 lb

## 2019-04-09 DIAGNOSIS — J449 Chronic obstructive pulmonary disease, unspecified: Secondary | ICD-10-CM | POA: Diagnosis not present

## 2019-04-09 DIAGNOSIS — J9611 Chronic respiratory failure with hypoxia: Secondary | ICD-10-CM

## 2019-04-09 LAB — CBC WITH DIFFERENTIAL/PLATELET
Basophils Absolute: 0.1 10*3/uL (ref 0.0–0.1)
Basophils Relative: 1 % (ref 0.0–3.0)
Eosinophils Absolute: 0.2 10*3/uL (ref 0.0–0.7)
Eosinophils Relative: 1.7 % (ref 0.0–5.0)
HCT: 44 % (ref 36.0–46.0)
Hemoglobin: 14.8 g/dL (ref 12.0–15.0)
Lymphocytes Relative: 22.9 % (ref 12.0–46.0)
Lymphs Abs: 2.3 10*3/uL (ref 0.7–4.0)
MCHC: 33.6 g/dL (ref 30.0–36.0)
MCV: 103.7 fl — ABNORMAL HIGH (ref 78.0–100.0)
Monocytes Absolute: 0.9 10*3/uL (ref 0.1–1.0)
Monocytes Relative: 9.1 % (ref 3.0–12.0)
Neutro Abs: 6.6 10*3/uL (ref 1.4–7.7)
Neutrophils Relative %: 65.3 % (ref 43.0–77.0)
Platelets: 295 10*3/uL (ref 150.0–400.0)
RBC: 4.25 Mil/uL (ref 3.87–5.11)
RDW: 13.7 % (ref 11.5–15.5)
WBC: 10.1 10*3/uL (ref 4.0–10.5)

## 2019-04-09 NOTE — Progress Notes (Signed)
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 Pulmonary Consult    Self referral. She has seen Dr. Lamonte Sakai in the past- last in 2012. She has been seeing Dr. Corrin Parker at [facility] and is here today to re-est care. She c/o DOE with cleaning her house or taking out the trash. She is on O2 with sleep at 2lpm. She uses proiar 1 x per wk on average and has a neb with albuterol that she rarely uses.   doe x [facility] = can't walk a nl pace on a flat grade s sob but does fine slow and flat eg shopping at slow pace / can't do ramps Doe Never changed since empyema despite having RT and chemo for BAC dx 09/14/09 at [facility] with [facility] 11/2009 and persistent bilateral MPN's under surveillance at [facility] but no recent bx's or further chemo.  Uses  advair 250 twice daily ? Benefit > saba doesn't really help any of her symptoms/ no signifcant chronic or assoc  Coughing. rec Plan A = Automatic = bevespi Take 2 puffs first thing in am and then another 2 puffs about 12 hours later.  Work on inhaler technique: Plan B = Backup Only use your albuterol as a rescue medication  Plan C = Crisis - only use your albuterol nebulizer if you first try Plan B and it fails to help > ok to use the nebulizer up to every 4 hours but if start needing it regularly call for immediate appointment    08/10/2017  f/u ov/[name] re: GOLD II COPD  Chief Complaint  Patient presents with  . Follow-up    Breathing is doing  well.  She uses her albuterol inhaler and neb rarely.    doe improved to [facility] = can walk nl pace, flat grade, can't hurry or go uphills or steps s sob   No noct symtoms   rec Continue the Bevespi Take 2 puffs first thing in am and then another 2 puffs about 12 hours later.          07/19/2018  f/u ov/[name] re: copd gold II / nasal congestion main concern  Chief Complaint  Patient presents with  . Follow-up    Breathing is unchanged since last OV. PFT completed today.  Dyspnea:  MMRC2 = can't walk a nl pace on a flat grade s sob but does fine slow and flat / limited by back pain more than breathing  Cough: min in am/ attributes to pnds some better with otc's ? Not sure names  Sleeping: bed flat / on side/ 2 pllows SABA use: 2-3 x weekly 02:  2lpm hs rec No change in your medications  Work on inhaler technique:   schedule sinus ct >>  07/22/18 No definite abnormality seen in maxillofacial region.     02/05/2019 acute extended ov/[name] re: sob / gold II on  bevespi  maint Chief Complaint  Patient presents with  . Acute Visit    SOB off and on since March 2020- she was sick in Feb 2020 also with fever when returned home from [place]. She has some sore throat. She has had fever 5-6 days ago.   always has R nasal congestion x decades  On antihisatamine/deoncongestant taking twice daily x years Flew to [place] in Feb 2020 felt was coming down with uti (incont/hematuria/feverish)  rx macodantin then cipro > def improvement but still filling feverish/weak  so flew home week early new increased sob /coughing more > saw [name] / Donna Haw NP and then [facility] phone care "just felt sick"  rx zpak / prednisone and back to baseline for only a day then feverish again with sweats but no chills and worse sob but no cough until nasal flare 02/01/19 > rx  Keflex then  levaquin  Fever is gone / minimal cough still slt green esp in am  When walking 02 always goes up  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  Please schedule a follow up office visit in 4 weeks, sooner if needed  with all medications /inhalers/ solutions in hand so we can verify exactly what you are taking. This includes all medications from all doctors and over the counters     03/10/2019  f/u ov/[name] re: did not remember meds - no longer using rescue inhaler or neb  Chief Complaint  Patient presents with  . Follow-up    Breathing has improved some.   Dyspnea:  Maybe 100 ft to mb and has to stop half way x years/ minimal incline / better p depomedrol then back to baseline  Cough: no cough/ still sense of throat congestion Sleeping: 2 pillows  SABA use:  02: 2lpm hs  Nose gets stopped up w/in a few min of being outdoors despite neg allergy profile  rec Change prilosec 20 mg Take 30- 60 min before your first and last meals of the day  Depomedrol 120 mg IM   Plan A = Automatic = symbicort 160 Take 2 puffs first thing in am and then another 2 puffs about 12 hours later.  Work on inhaler technique:   Plan B = Backup Only use your albuterol inhaler as a rescue medication  Plan C = Crisis - only use your albuterol nebulizer if you first try Plan B and it fails to help > ok to use the nebulizer up to every 4 hours but if start needing it regularly call for immediate appointment Please schedule a follow up office visit in 4 weeks, sooner if needed  with all medications /inhalers/ solutions in hand so we can verify exactly what you are taking. This includes all medications from all doctors and over the counters   04/09/2019  f/u ov/Aidan Caloca  re: GOLD II copd/ 02 bedtime only  / changed to trelegydue to insurance Chief Complaint  Patient presents with  . Follow-up    Breathing is doing well- she  has been using trelegy. She is using her albuterol inhaler 1-2 x per day and has not used her neb.   Dyspnea:  Says doing better but really not doing anything strenuous  Cough: congested  Sleeping: flat bed, 2 pillows  SABA use: twice daily  Typically p walking 02: says 02 drops p ex but only p sits down  r No obvious day to day or daytime variability or assoc excess/ purulent sputum or mucus plugs or hemoptysis or cp or chest tightness, subjective wheeze or overt sinus or hb symptoms.   Sleeping as above  without nocturnal  or early am exacerbation  of respiratory  c/o's or need for noct saba. Also denies any obvious fluctuation of symptoms with weather or environmental changes or other aggravating or alleviating factors except as outlined above   No unusual exposure hx or h/o childhood pna/ asthma or knowledge of premature birth.  Current Allergies, Complete Past Medical History, Past Surgical History, Family History, and Social History were reviewed in [employer] record.  ROS  The following are not active complaints unless bolded Hoarseness, sore throat, dysphagia, dental problems, itching, sneezing,  nasal congestion or discharge of excess mucus or purulent secretions, ear ache,   fever, chills, sweats, unintended wt loss or wt gain, classically pleuritic or exertional cp,  orthopnea pnd or arm/hand swelling  or leg swelling, presyncope, palpitations, abdominal pain, anorexia, nausea, vomiting, diarrhea  or change in bowel habits or change in bladder habits, change in stools or change in urine, dysuria, hematuria,  rash, arthralgias, visual complaints, headache, numbness, weakness or ataxia or problems with walking or coordination,  change in mood or  memory.        Current Meds  Medication Sig  .  acetaminophen (TYLENOL) 325 MG tablet Take by mouth.  Marland Kitchen albuterol (PROAIR HFA) 108 (90 Base) MCG/ACT inhaler Inhale 1 puff into the lungs every 6 (six) hours as needed for wheezing or shortness of breath.  Marland Kitchen albuterol (PROVENTIL) (2.5 MG/3ML) 0.083% nebulizer solution USE 1 VIAL (3 ML OR 2.5 MG TOTAL) VIA NEBULIZER EVERY 4 HOURS AS NEEDED FOR WHEEZING OR SHORTNESS OF BREATH  . ALPRAZolam (XANAX) 0.5 MG tablet TAKE 1 TABLET BY MOUTH EVERY DAY AT BEDTIME AS NEEDED FOR ANXIETY OR INSOMNIA  . b complex-C-folic acid 1 MG capsule Take 1 capsule by mouth daily.    Marland Kitchen buPROPion (WELLBUTRIN XL) 150 MG 24 hr tablet TAKE 1 TABLET DAILY  . Calcium Carbonate-Vit D-Min (CALCIUM 1200 PO) Take by mouth.  . Cyanocobalamin 1000 MCG CAPS Take by mouth.  . doxazosin (CARDURA) 4 MG tablet Take 1 tablet (4 mg total) by mouth daily.  . DULoxetine (CYMBALTA) 60 MG capsule TAKE 1 CAPSULE DAILY  . fluticasone (FLONASE) 50 MCG/ACT nasal spray Place 2 sprays into both nostrils daily.  . folic acid (FOLVITE) 1 MG tablet Take by mouth.  . levothyroxine (SYNTHROID) 112 MCG tablet Take 1 tablet (112 mcg total) by mouth daily.  Marland Kitchen losartan-hydrochlorothiazide (HYZAAR) 100-25 MG tablet Take 1 tablet by mouth daily.  Marland Kitchen nystatin (MYCOSTATIN) 100000 UNIT/ML suspension Take 5 mLs (500,000 Units total) by mouth 4 (four) times daily. Swish and swallow.  Marland Kitchen omeprazole (PRILOSEC) 20 MG capsule Take 1 capsule (20 mg total) by mouth 2 (two) times daily before a meal.  . OXYGEN 2lpm with sleep  . Phenylephrine HCl (WAL-PHED PE PO) Take 1 tablet by mouth at bedtime.  . pravastatin (PRAVACHOL) 40 MG tablet TAKE 1 TABLET DAILY (STOP  SIMVASTATIN)  . traZODone (DESYREL) 50 MG tablet TAKE 1 TABLET AT BEDTIME AS NEEDED FOR SLEEP  . valACYclovir (VALTREX) 500 MG tablet TAKE 1 TABLET(500 MG) BY MOUTH THREE TIMES DAILY  . VITAMIN D, CHOLECALCIFEROL, PO Take 1 tablet by mouth daily.               Objective:   Physical Exam  amb wf  nad   04/09/2019       173  03/10/2019       179 02/05/2019       179  07/19/2018       173  06/19/2018       175  08/10/2017     170   06/29/17 166 lb (75.3 kg)  05/28/17 164 lb (74.4 kg)  05/03/17 166 lb (75.3 kg)    Vital signs reviewed - Note on arrival 02 sats  94% on RA     HEENT: nl  oropharynx. Nl external ear canals without cough reflex -  Mild bilateral non-specific turbinate edema     NECK :  without JVD/Nodes/TM/ nl carotid upstrokes bilaterally   LUNGS: no acc muscle use,  Mild barrel  contour chest wall with bilateral  Distant bs s audible wheeze and  without cough on insp or exp maneuver and mild  Hyperresonant  to  percussion bilaterally     CV:  RRR  no s3 or murmur or increase in P2, and no edema   ABD:  soft and nontender with pos end  insp Hoover's  in the supine position. No bruits or organomegaly appreciated, bowel sounds nl  MS:   Nl gait/  ext warm without deformities, calf tenderness, cyanosis or clubbing No obvious joint restrictions   SKIN: warm and dry without lesions    NEURO:  alert, approp, nl sensorium with  no motor or cerebellar deficits apparent.      Labs ordered 04/09/2019   Lab Results  Component Value Date   WBC 10.1 04/09/2019   HGB 14.8 04/09/2019   HCT 44.0 04/09/2019   MCV 103.7 (H) 04/09/2019   PLT 295.0 04/09/2019       Assessment:

## 2019-04-09 NOTE — Progress Notes (Signed)
LMTCB

## 2019-04-09 NOTE — Patient Instructions (Signed)
Walk regularly and monitor your 02 while actively walking, not after you stop and it's dropping we need to see you back you   If you are satisfied with your treatment plan,  let your doctor know and he/she can either refill your medications or you can return here when your prescription runs out.     If in any way you are not 100% satisfied,  please tell us.  If 100% better, tell your friends!  Pulmonary follow up is as needed

## 2019-04-12 ENCOUNTER — Encounter: Payer: Self-pay | Admitting: Internal Medicine

## 2019-04-12 NOTE — Assessment & Plan Note (Addendum)
See care everywhere pulmonary notes from 2016/ [facility] > rx 2lpm hs  - 06/19/2018  Walked RA x 3 laps @ 185 ft each stopped due to  End of study, moderately fast pace, no desat but sob at end    - 04/09/2019   [facility] RA  2 laps @  approx 224ft each @ moderate pace  stopped due to  Sob at end but sats still 91%      Lab Results  Component Value Date   HGB 14.8 04/09/2019   HGB 14.4 01/28/2019   HGB 13.6 05/21/2018   HGB 11.4 (L) 10/20/2009     Adequate control on present rx, reviewed in detail with pt > no change in rx needed  Q= 2lpm hs , none during the day indicated   >>>> Since trelegy rx per pcp and can get refills thru Dr Dennard Schaumann and f/u here as needed    I had an extended discussion with the patient  reviewing all relevant studies completed to date and  lasting 15 to 20 minutes of a 25 minute visit  which included directly observing ambulatory 02 saturation study documented in a/p section of  today's  office note.  I performed device teaching  using a teach back technique which also  extended face to face time for this visit (see above)   Each maintenance medication was reviewed in detail including most importantly the difference between maintenance and prns and under what circumstances the prns are to be triggered using an action plan format that is not reflected in the computer generated alphabetically organized AVS.     Please see AVS for specific instructions unique to this visit that I personally wrote and verbalized to the the pt in detail and then reviewed with pt  by my nurse highlighting any changes in therapy recommended at today's visit .

## 2019-04-12 NOTE — Assessment & Plan Note (Signed)
Quit smoking 2006 PFT's  08/07/11  FEV1 2.14 (89 % ) ratio 69  with DLCO  73 % corrects to 108 % for alv volume  - Spirometry 06/29/2017  FEV1 1.24 (52%)  Ratio 64 on advair -  Allergy profile 06/29/17  >  Eos 0.2/  IgE  13 RAST neg  - 06/29/2017  try bevespi  2bid > improved as of 08/10/18  -  Spirometry 06/19/2018  FEV1 1.4 (57%)  Ratio 64 p bevespi x 2 pffs   - PFT's  07/19/2018  FEV1 1.63  (74 % ) ratio 68  p 13 % improvement from saba p nothing prior to study with DLCO  71/70c % corrects to 93 % for alv volume    - 02/05/2019   continue bevespi  - 03/2019 changed to trelegy du to insurance   By hx agree  Group D in terms of symptom/risk and laba/lama/ICS  therefore appropriate rx at this point >>>  trelegy appropr   - The proper method of use, as well as anticipated side effects, of an elipta  inhaler werediscussed and demonstrated to the patient.

## 2019-04-23 ENCOUNTER — Other Ambulatory Visit: Payer: Self-pay

## 2019-04-23 ENCOUNTER — Ambulatory Visit (INDEPENDENT_AMBULATORY_CARE_PROVIDER_SITE_OTHER): Payer: Medicare Other | Admitting: Family Medicine

## 2019-04-23 ENCOUNTER — Encounter: Payer: Self-pay | Admitting: Family Medicine

## 2019-04-23 VITALS — BP 118/76 | HR 96 | Temp 98.9°F | Resp 24 | Ht 65.0 in | Wt 175.0 lb

## 2019-04-23 DIAGNOSIS — R0609 Other forms of dyspnea: Secondary | ICD-10-CM | POA: Diagnosis not present

## 2019-04-23 DIAGNOSIS — Z85118 Personal history of other malignant neoplasm of bronchus and lung: Secondary | ICD-10-CM | POA: Diagnosis not present

## 2019-04-23 DIAGNOSIS — F902 Attention-deficit hyperactivity disorder, combined type: Secondary | ICD-10-CM | POA: Diagnosis not present

## 2019-04-23 DIAGNOSIS — R0600 Dyspnea, unspecified: Secondary | ICD-10-CM

## 2019-04-23 DIAGNOSIS — L299 Pruritus, unspecified: Secondary | ICD-10-CM | POA: Diagnosis not present

## 2019-04-23 MED ORDER — MOMETASONE FUROATE 0.1 % EX CREA: 1.0000 "application " | TOPICAL_CREAM | Freq: Every day | CUTANEOUS | 0 refills | Status: DC

## 2019-04-23 NOTE — Progress Notes (Signed)
Subjective:    Patient ID: [name], female    DOB: 12-28-1940, 78 y.o.   MRN: [id number]  HPI  01/28/19 Patient presents today with shortness of breath with activity.  She states that she just does not feel well.  However she states that since January she feels very short of breath with any activity that she does.  Currently she is wearing oxygen at night however she feels that she would benefit from oxygen during the daytime with activity because after walking short distances, she begins to wheeze and feel very short of breath.  She denies any chest pain.  She is using Bevespi 2 puffs inhaled twice daily.  She denies any seasonal allergies.  She denies any acid reflux and she is on a proton pump inhibitor.  She denies any recent fever or chills or purulent sputum.  She does have increased urinary frequency and urgency and is concerned that she may have a urinary tract infection adding to her symptomatology making her feel worse however her urinalysis today shows only trace ketones and is otherwise negative for any leukocyte esterase, nitrites, or blood.  Patient is satting 97% on room air.  I walked with the patient around our clinic.  After 1 lap which is approximately 60 feet, the patient's oxygen saturation was 95%, after 2 laps her oxygen saturation was 93% and after the third lap her oxygen saturation was down to 91%.  She was wheezing on expiration however the wheezing sounded more akin to upper airway sounds rather than true deep bronchospasm.  She does have tachycardia.  Heart rate is 100 at rest and with activity rises to 110 bpm.    Review of her medical records show that she had an echocardiogram of the heart performed in 2010.  At that time ejection fraction was 60 to 65%.  However I can find no other echocardiogram since that time.  She has a history of lung cancer currently managed at [facility].  The last CT scan of the chest in epic however was many years ago.  I do not see a recent  chest x-ray or imaging of the lung.  To get a baseline of her COPD I did perform pulmonary function test.  Patient's FEV1 was 1.25 L which is 62% of predicted.  Patient's FVC is 2.16 L which is 79% of predicted.  Her FEV1 to FVC ratio was 58%.  Therefore she has moderate obstruction stage II borderline stage III.  At that time, my plan was: Patient has borderline stage III COPD and may benefit from switching Bevespi to Trelegy.  She does have mild oxygen drops with ambulation but it does not require oxygen.  Her shortness of breath however is out of proportion to her exam today.  She is very winded simply walking around the office she is also very tachycardic.  Given her recent trip to [place] and plane ride and history of lung cancer, pulmonary embolism is on the differential diagnosis particular with her tachycardia.  Therefore I will check a d-dimer and if positive a CT scan of the lung to rule out PE.  Also get an echocardiogram to evaluate for any evidence of congestive heart failure.  I will check a CBC, CMP, and TSH as well.  Once I have the above work-up completed if no other explanation is found, I will switch the patient from [facility] to Trelegy.  Obtain baseline chest x-ray today   04/23/19 Patient presents with several concerns today.  First she would like a referral to an oncologist in [place].  She has been seeing an oncologist at [facility].  Past medical history is significant for non-small cell lung cancer adenocarcinoma diagnosed in 2011.  This was found in the left upper lobe.  VATS was converted to an upper lobe resection performed by Dr. Arlyce Dice.  Mass was removed in its entirety.  Lymph nodes were negative for metastatic spread.  Patient completed chemotherapy and has had negative scans performed at [facility] since.  However she would like to have an oncologist in [place] to follow her case.  She does not want to have to travel to [facility] again.  Patient has now been  cancer free for 9 years.  However she would like a second opinion from oncology.  She also reports dyspnea on exertion and wants an oxygen concentrator that she can carry with her portably to help with her dyspnea.  She is 95% on room air.  With ambulation walking more than 200 feet she drops only to 94%.  Although tachycardic, her oxygen desaturation does not qualify for portable oxygen.  Third the patient wants an evaluation for memory loss.  She states that she feels like she is having hard time coming up with words.  Mini-Mental Status exam is performed today and the patient scored 30 out of 30 on Mini-Mental status exam.  However she does have ADD and the patient has been off her medicines for ADD for over 4 months.  She jumps from topic to topic rapidly.  Her conversation is often hard to follow.  She tends to lose focus.  I believe a lot of her memory issues may be due to her ADD and lack of attention coupled with normal age-related changes rather than true underlying dementia.  We had a long discussion about this as well.  Fourth she reports itching in her hands and feet.  She states that she has a rash.  However visibly on exam there is no visible rash at all.  There are some excoriations due to scratching.  The itching is limited to the hands and feet.   Past Medical History:  Diagnosis Date   ADD (attention deficit disorder)    Allergic rhinitis    Anemia    Anxiety    Arrhythmia    Arthritis    "hands, neck, right shoulder" (07/28/2015)   Aseptic necrosis ([place])    Asthma dxc'd 05/2015   Basal cell cancer    Bleeding stomach ulcer ~ 06/2005   COPD (chronic obstructive pulmonary disease) (HCC)    Depression    Diverticula of colon    Dyspnea    Esophagitis    Gastric ulcer with hemorrhage    GERD (gastroesophageal reflux disease)    Heart murmur    History of blood transfusion 06/2005   "related to pneumonia"   History of kidney stones    Hyperlipidemia     Hypertension    Hypothyroidism    Kidney stones    Lung cancer ([place])    Non small cell lung cancer, adenocarcinoma with BAC 09/2009, VATS converted to wedge resection of left upper lobe mass   Mitral valve prolapse    "no ORs" (07/28/2015)   Osteopenia    Pneumonia 1970's; 06/2005   Sleep apnea dx'd 06/2015   on oxygen at nite at 2L/[place]    Squamous cell skin cancer    Past Surgical History:  Procedure Laterality Date   [place]  COLONOSCOPY WITH PROPOFOL N/A 06/15/2017   Procedure: COLONOSCOPY WITH PROPOFOL;  Surgeon: Carol Ada, MD;  Location: [facility] ENDOSCOPY;  Service: Endoscopy;  Laterality: N/A;   HAMMER TOE SURGERY Right ~ 2010   SALPINGOOPHORECTOMY Left 1988   SKIN CANCER EXCISION     "I've had severl cut off RLE; left foreqrm, nose under nose"   THORACOTOMY Left 06/2005   THORACOTOMY/LOBECTOMY  09/2009; 11/2009   left; right   Current Outpatient Medications on File Prior to Visit  Medication Sig Dispense Refill   acetaminophen (TYLENOL) 325 MG tablet Take by mouth.     albuterol (PROAIR HFA) 108 (90 Base) MCG/ACT inhaler Inhale 1 puff into the lungs every 6 (six) hours as needed for wheezing or shortness of breath.     albuterol (PROVENTIL) (2.5 MG/3ML) 0.083% nebulizer solution USE 1 VIAL (3 ML OR 2.5 MG TOTAL) VIA NEBULIZER EVERY 4 HOURS AS NEEDED FOR WHEEZING OR SHORTNESS OF BREATH 450 mL 3   ALPRAZolam (XANAX) 0.5 MG tablet TAKE 1 TABLET BY MOUTH EVERY DAY AT BEDTIME AS NEEDED FOR ANXIETY OR INSOMNIA 90 tablet 0   b complex-C-folic acid 1 MG capsule Take 1 capsule by mouth daily.       buPROPion (WELLBUTRIN XL) 150 MG 24 hr tablet TAKE 1 TABLET DAILY 90 tablet 3   Calcium Carbonate-Vit D-Min (CALCIUM 1200 PO) Take by mouth.     Cyanocobalamin 1000 MCG CAPS Take by mouth.     doxazosin (CARDURA) 4 MG tablet Take 1 tablet (4 mg total) by mouth daily. 90 tablet 4   DULoxetine (CYMBALTA) 60 MG capsule TAKE 1 CAPSULE DAILY 90 capsule  3   fluticasone (FLONASE) 50 MCG/ACT nasal spray Place 2 sprays into both nostrils daily. 48 g 4   folic acid (FOLVITE) 1 MG tablet Take by mouth.     levothyroxine (SYNTHROID) 112 MCG tablet Take 1 tablet (112 mcg total) by mouth daily. 90 tablet 3   losartan-hydrochlorothiazide (HYZAAR) 100-25 MG tablet Take 1 tablet by mouth daily. 90 tablet 4   nystatin (MYCOSTATIN) 100000 UNIT/ML suspension Take 5 mLs (500,000 Units total) by mouth 4 (four) times daily. Swish and swallow. 120 mL 0   omeprazole (PRILOSEC) 20 MG capsule Take 1 capsule (20 mg total) by mouth 2 (two) times daily before a meal. 90 capsule 3   OXYGEN 2lpm with sleep     Phenylephrine HCl (WAL-PHED PE PO) Take 1 tablet by mouth at bedtime.     pravastatin (PRAVACHOL) 40 MG tablet TAKE 1 TABLET DAILY (STOP SIMVASTATIN) 90 tablet 4   traZODone (DESYREL) 50 MG tablet TAKE 1 TABLET AT BEDTIME AS NEEDED FOR SLEEP 90 tablet 3   valACYclovir (VALTREX) 500 MG tablet TAKE 1 TABLET(500 MG) BY MOUTH THREE TIMES DAILY 21 tablet 2   VITAMIN D, CHOLECALCIFEROL, PO Take 1 tablet by mouth daily.     TRELEGY ELLIPTA 100-62.5-25 MCG/INH AEPB      No current facility-administered medications on file prior to visit.    Allergies  Allergen Reactions   Doxycycline Nausea Only   Erythromycin Nausea Only   Sulfonamide Derivatives Hives   Social History   Socioeconomic History   Marital status: Married    Spouse name: Not on file   Number of children: 3   Years of education: Not on file   Highest education level: Not on file  Occupational History   Occupation: retired Optician, dispensing: RETIRED  Social Needs   Financial resource strain: Not on  file   Food insecurity    Worry: Not on file    Inability: Not on file   Transportation needs    Medical: Not on file    Non-medical: Not on file  Tobacco Use   Smoking status: Former Smoker    Packs/day: 0.75    Years: 30.00    Pack years: 22.50    Types:  Cigarettes    Quit date: 09/11/2004    Years since quitting: 14.6   Smokeless tobacco: Never Used  Substance and Sexual Activity   Alcohol use: Yes    Alcohol/week: 7.0 standard drinks    Types: 7 Shots of liquor per week    Comment: 1-2 drinks nightly    Drug use: No   Sexual activity: Yes  Lifestyle   Physical activity    Days per week: Not on file    Minutes per session: Not on file   Stress: Not on file  Relationships   Social connections    Talks on phone: Not on file    Gets together: Not on file    Attends religious service: Not on file    Active member of club or organization: Not on file    Attends meetings of clubs or organizations: Not on file    Relationship status: Not on file   Intimate partner violence    Fear of current or ex partner: Not on file    Emotionally abused: Not on file    Physically abused: Not on file    Forced sexual activity: Not on file  Other Topics Concern   Not on file  Social History Narrative   Not on file      Review of Systems  All other systems reviewed and are negative.      Objective:   Physical Exam Constitutional:      General: She is not in acute distress.    Appearance: Normal appearance. She is normal weight. She is not ill-appearing or toxic-appearing.  HENT:     Nose: Nose normal. No congestion or rhinorrhea.  Cardiovascular:     Rate and Rhythm: Regular rhythm. Tachycardia present.     Heart sounds: Normal heart sounds.  Pulmonary:     Effort: Pulmonary effort is normal. No respiratory distress.     Breath sounds: No stridor. No wheezing, rhonchi or rales.  Musculoskeletal:     Right lower leg: No edema.     Left lower leg: No edema.  Neurological:     Mental Status: She is alert.           Assessment & Plan:  1. Itching There is no visible rash today.  I believe the itching is neuropathic in nature.  This could be due to peripheral neuropathy versus anxiety.  I have recommended trying  Elocon cream once daily as needed for itching - mometasone (ELOCON) 0.1 % cream; Apply 1 application topically daily.  Dispense: 45 g; Refill: 0  2. Dyspnea on exertion Multifactorial however the patient does not qualify for portable oxygen.  3. History of lung cancer Patient would like to establish with an oncologist here in [place] that she can see annually rather than go to [facility] for follow-up scans.  I will consult oncology however the patient is now 9 years removed and therefore this may not be necessary.  I will schedule a consultation with the oncologist to get their opinion however I believe the patient may no longer require annual surveillance  4. Attention deficit hyperactivity  disorder (ADHD), combined type Mini-Mental status exam is normal.  I do believe the patient may have some normal age-related short-term memory impairment coupled with ADD.  I believe this is causing more of her memory issues than true underlying dementia.  At the present time I see no role for medication.  I reassured the patient.  I did offer the patient cognitive testing through [facility] neurology but she politely declined.

## 2019-04-24 ENCOUNTER — Encounter: Payer: Self-pay | Admitting: Family Medicine

## 2019-04-25 ENCOUNTER — Encounter: Payer: Self-pay | Admitting: Family Medicine

## 2019-04-26 ENCOUNTER — Encounter: Payer: Self-pay | Admitting: Family Medicine

## 2019-04-30 ENCOUNTER — Other Ambulatory Visit: Payer: Self-pay | Admitting: Family Medicine

## 2019-04-30 NOTE — Telephone Encounter (Signed)
[facility] sent a request for a refill on Xanax - ok to refill through mail order??? She was getting them at [facility].

## 2019-05-01 ENCOUNTER — Telehealth: Payer: Self-pay | Admitting: Internal Medicine

## 2019-05-01 ENCOUNTER — Encounter: Payer: Self-pay | Admitting: *Deleted

## 2019-05-01 ENCOUNTER — Ambulatory Visit
Admission: RE | Admit: 2019-05-01 | Discharge: 2019-05-01 | Disposition: A | Discharge status: Home / Self Care | Payer: Self-pay | Source: Ambulatory Visit | Attending: Internal Medicine | Admitting: Internal Medicine

## 2019-05-01 DIAGNOSIS — C3480 Malignant neoplasm of overlapping sites of unspecified bronchus and lung: Secondary | ICD-10-CM

## 2019-05-01 NOTE — Progress Notes (Signed)
Oncology Nurse Navigator Documentation  Oncology Nurse Navigator Flowsheets 05/01/2019  Navigator Location [facility]  Referral Date to RadOnc/MedOnc 04/30/2019  Navigator Encounter Type Other/I received referral on [name].  I updated new patient coordinator to call and schedule her to be seen next week.  I have obtained medical records from [facility].  I am working on getting images pushed into PACS.   Treatment Phase Other  Barriers/Navigation Needs Coordination of Care  Interventions Coordination of Care  Coordination of Care Other  Acuity Level 3  Time Spent with Patient 60

## 2019-05-01 NOTE — Telephone Encounter (Signed)
Recommend we go through [facility] for a controlled substance.

## 2019-05-01 NOTE — Telephone Encounter (Signed)
Received a new patient referral from Dr. Dennard Schaumann for a hx of lung cancer. [name] has been cld and scheduled to see [name] on 8/25 at 2:15pm w/labs at 1:45pm. [name] has agreed to the appt date and time.

## 2019-05-01 NOTE — Telephone Encounter (Signed)
Patient aware per MyChart.   

## 2019-05-06 ENCOUNTER — Other Ambulatory Visit: Payer: Self-pay

## 2019-05-06 ENCOUNTER — Inpatient Hospital Stay: Payer: Medicare Other

## 2019-05-06 ENCOUNTER — Inpatient Hospital Stay: Payer: Medicare Other | Attending: Internal Medicine | Admitting: Internal Medicine

## 2019-05-06 ENCOUNTER — Encounter: Payer: Self-pay | Admitting: Internal Medicine

## 2019-05-06 VITALS — BP 135/75 | HR 91 | Temp 98.2°F | Resp 18 | Ht 65.0 in | Wt 176.8 lb

## 2019-05-06 DIAGNOSIS — J449 Chronic obstructive pulmonary disease, unspecified: Secondary | ICD-10-CM | POA: Diagnosis not present

## 2019-05-06 DIAGNOSIS — Z881 Allergy status to other antibiotic agents status: Secondary | ICD-10-CM | POA: Diagnosis not present

## 2019-05-06 DIAGNOSIS — M419 Scoliosis, unspecified: Secondary | ICD-10-CM | POA: Insufficient documentation

## 2019-05-06 DIAGNOSIS — Z8249 Family history of ischemic heart disease and other diseases of the circulatory system: Secondary | ICD-10-CM | POA: Insufficient documentation

## 2019-05-06 DIAGNOSIS — C3490 Malignant neoplasm of unspecified part of unspecified bronchus or lung: Secondary | ICD-10-CM

## 2019-05-06 DIAGNOSIS — Z7289 Other problems related to lifestyle: Secondary | ICD-10-CM | POA: Diagnosis not present

## 2019-05-06 DIAGNOSIS — Z882 Allergy status to sulfonamides status: Secondary | ICD-10-CM | POA: Diagnosis not present

## 2019-05-06 DIAGNOSIS — M549 Dorsalgia, unspecified: Secondary | ICD-10-CM | POA: Diagnosis not present

## 2019-05-06 DIAGNOSIS — Z832 Family history of diseases of the blood and blood-forming organs and certain disorders involving the immune mechanism: Secondary | ICD-10-CM | POA: Diagnosis not present

## 2019-05-06 DIAGNOSIS — Z87891 Personal history of nicotine dependence: Secondary | ICD-10-CM | POA: Diagnosis not present

## 2019-05-06 DIAGNOSIS — R0609 Other forms of dyspnea: Secondary | ICD-10-CM | POA: Diagnosis not present

## 2019-05-06 DIAGNOSIS — Z87442 Personal history of urinary calculi: Secondary | ICD-10-CM | POA: Insufficient documentation

## 2019-05-06 DIAGNOSIS — E785 Hyperlipidemia, unspecified: Secondary | ICD-10-CM | POA: Insufficient documentation

## 2019-05-06 DIAGNOSIS — R5383 Other fatigue: Secondary | ICD-10-CM

## 2019-05-06 DIAGNOSIS — C3412 Malignant neoplasm of upper lobe, left bronchus or lung: Secondary | ICD-10-CM | POA: Insufficient documentation

## 2019-05-06 DIAGNOSIS — C3480 Malignant neoplasm of overlapping sites of unspecified bronchus and lung: Secondary | ICD-10-CM

## 2019-05-06 DIAGNOSIS — I341 Nonrheumatic mitral (valve) prolapse: Secondary | ICD-10-CM | POA: Diagnosis not present

## 2019-05-06 DIAGNOSIS — Z8261 Family history of arthritis: Secondary | ICD-10-CM

## 2019-05-06 DIAGNOSIS — K219 Gastro-esophageal reflux disease without esophagitis: Secondary | ICD-10-CM | POA: Diagnosis not present

## 2019-05-06 DIAGNOSIS — C3411 Malignant neoplasm of upper lobe, right bronchus or lung: Secondary | ICD-10-CM | POA: Diagnosis not present

## 2019-05-06 DIAGNOSIS — Z801 Family history of malignant neoplasm of trachea, bronchus and lung: Secondary | ICD-10-CM | POA: Insufficient documentation

## 2019-05-06 DIAGNOSIS — E039 Hypothyroidism, unspecified: Secondary | ICD-10-CM | POA: Insufficient documentation

## 2019-05-06 DIAGNOSIS — I10 Essential (primary) hypertension: Secondary | ICD-10-CM | POA: Diagnosis not present

## 2019-05-06 DIAGNOSIS — M8580 Other specified disorders of bone density and structure, unspecified site: Secondary | ICD-10-CM | POA: Insufficient documentation

## 2019-05-06 DIAGNOSIS — Z803 Family history of malignant neoplasm of breast: Secondary | ICD-10-CM | POA: Diagnosis not present

## 2019-05-06 DIAGNOSIS — F419 Anxiety disorder, unspecified: Secondary | ICD-10-CM | POA: Diagnosis not present

## 2019-05-06 DIAGNOSIS — Z79899 Other long term (current) drug therapy: Secondary | ICD-10-CM | POA: Diagnosis not present

## 2019-05-06 DIAGNOSIS — F329 Major depressive disorder, single episode, unspecified: Secondary | ICD-10-CM | POA: Diagnosis not present

## 2019-05-06 LAB — CMP (CANCER CENTER ONLY)
ALT: 21 U/L (ref 0–44)
AST: 23 U/L (ref 15–41)
Albumin: 3.8 g/dL (ref 3.5–5.0)
Alkaline Phosphatase: 84 U/L (ref 38–126)
Anion gap: 11 (ref 5–15)
BUN: 20 mg/dL (ref 8–23)
CO2: 26 mmol/L (ref 22–32)
Calcium: 9.1 mg/dL (ref 8.9–10.3)
Chloride: 102 mmol/L (ref 98–111)
Creatinine: 0.8 mg/dL (ref 0.44–1.00)
GFR, Est AFR Am: >60 mL/min (ref >60)
GFR, Estimated: >60 mL/min (ref >60)
Glucose, Bld: 91 mg/dL (ref 70–99)
Potassium: 3.6 mmol/L (ref 3.5–5.1)
Sodium: 139 mmol/L (ref 135–145)
Total Bilirubin: 0.3 mg/dL (ref 0.3–1.2)
Total Protein: 6.7 g/dL (ref 6.5–8.1)

## 2019-05-06 LAB — CBC WITH DIFFERENTIAL (CANCER CENTER ONLY)
Abs Immature Granulocytes: 0.05 10*3/uL (ref 0.00–0.07)
Basophils Absolute: 0.1 10*3/uL (ref 0.0–0.1)
Basophils Relative: 1 %
Eosinophils Absolute: 0.1 10*3/uL (ref 0.0–0.5)
Eosinophils Relative: 1 %
HCT: 40.1 % (ref 36.0–46.0)
Hemoglobin: 13.8 g/dL (ref 12.0–15.0)
Immature Granulocytes: 1 %
Lymphocytes Relative: 21 %
Lymphs Abs: 2.2 10*3/uL (ref 0.7–4.0)
MCH: 35.7 pg — ABNORMAL HIGH (ref 26.0–34.0)
MCHC: 34.4 g/dL (ref 30.0–36.0)
MCV: 103.6 fL — ABNORMAL HIGH (ref 80.0–100.0)
Monocytes Absolute: 0.9 10*3/uL (ref 0.1–1.0)
Monocytes Relative: 8 %
Neutro Abs: 7.4 10*3/uL (ref 1.7–7.7)
Neutrophils Relative %: 68 %
Platelet Count: 267 10*3/uL (ref 150–400)
RBC: 3.87 MIL/uL (ref 3.87–5.11)
RDW: 13.2 % (ref 11.5–15.5)
WBC Count: 10.7 10*3/uL — ABNORMAL HIGH (ref 4.0–10.5)
nRBC: 0 % (ref 0.0–0.2)

## 2019-05-06 NOTE — Progress Notes (Signed)
Donna [phone]   [phone] [name]  CONSULT NOTE  REFERRING PHYSICIAN: Dr. Jean [name]  REASON FOR CONSULTATION:  78 years old white female with history of lung cancer  HPI [name] is a 78 y.o. female with past medical history significant for multiple medical problems including history of hypertension, dyslipidemia, hypothyroidism, mitral valve prolapse, GERD, COPD, basal cell carcinoma of the skin, and anxiety and depression as well as history of asthma and history for smoking but quit in 2006.  The patient mentions that in 2006 she was diagnosed with pneumonia as well as empyema and was treated in the intensive care unit for 2 weeks or so.  At that time the patient was found to have groundglass opacities.  This was followed by observation.  On August 25, 2009 she had CT scan of the chest with contrast that showed increase in size of 2 groundglass opacities, one in the right upper lobe near the apex and a second in the more anterior left upper lobe both of which were worrisome for slowly growing bronchoalveolar carcinoma.  She was seen by [name] and a PET scan at that time showed low level metabolic activity.  She underwent CT-guided core biopsy of the left upper lobe spiculated groundglass nodular opacity by interventional radiology on September 14, 2009.  The final pathology showed malignant cells consistent with non-small cell carcinoma.  On September 23, 2009 the patient underwent wedge resection of the left upper lobe under the care of [name] and the final pathology was consistent with moderately differentiated adenocarcinoma spanning 0.7 cm.  On December 28, 2009 she underwent wedge resection of the right upper lobe and the final pathology was also consistent with well-differentiated bronchoalveolar carcinoma measuring 1.5 cm in greatest dimension.  There was no evidence for metastatic disease to the dissected lymph nodes.  The patient had a  son in the [place] area and he recommended for her to see a medical oncologist at [facility], [name].  Molecular studies showed the tumor was negative for EGFR and ALK gene translocation. She was treated with adjuvant systemic chemotherapy with carboplatin, Alimta and Avastin for 6 cycles.  She has a rough time with that treatment with mucositis. After completion of her treatment she was transferred to [name] at [facility] and she was followed by observation.  She was noted to have slight increase in size of soft tissue nodule within the left upper lobe worrisome for disease progression but the other bilateral pulmonary nodules were grossly unchanged.  She underwent SBRT to the enlarging left upper lobe lung mass completed November 28, 2012.  Her further imaging studies showed persistent stable groundglass nodules in the lungs bilaterally.  Her last visit with [name] was in February 2019 and CT scan of the chest on November 06, 2017 showed a stable left upper lobe opacity related to the prior SBRT.  There was also scattered groundglass and partially solid lung nodules that were stable.  The patient did not follow-up with [name] since that time.  She is taking care of her husband who is suffering from dementia and she requested her care to be transferred to [name]. When seen today the patient is feeling fine with no concerning complaints except for intermittent aching back pain from scoliosis.  She also has fatigue and shortness of breath with exertion especially in hot weather.  She is followed by [name] from pulmonary medicine and  she is currently on inhaler for her COPD.  The patient denied having any chest pain but has mild cough with no hemoptysis.  She denied having any nausea, vomiting, diarrhea or constipation.  She has no headache or visual changes. Family history significant for father who died at age 51 from old age and mother died from  heart disease.  She has a maternal aunt with breast cancer and an uncle with lung cancer. The patient is married and has 3 biologic children and 2 stepchildren.  She used to work as a Interior and spatial designer at [employer].  She is currently retired.  She has a history for smoking 0.5 pack/day for around 30 years and quit in 2006.  She drinks 2 alcoholic drinks every night and no history of drug abuse.  HPI  Past Medical History:  Diagnosis Date   ADD (attention deficit disorder)    Allergic rhinitis    Anemia    Anxiety    Arrhythmia    Arthritis    "hands, neck, right shoulder" (07/28/2015)   Aseptic necrosis ([place])    Asthma dxc'd 05/2015   Basal cell cancer    Bleeding stomach ulcer ~ 06/2005   COPD (chronic obstructive pulmonary disease) (HCC)    Depression    Diverticula of colon    Dyspnea    Esophagitis    Gastric ulcer with hemorrhage    GERD (gastroesophageal reflux disease)    Heart murmur    History of blood transfusion 06/2005   "related to pneumonia"   History of kidney stones    Hyperlipidemia    Hypertension    Hypothyroidism    Kidney stones    Lung cancer ([place])    Non small cell lung cancer, adenocarcinoma with BAC 09/2009, VATS converted to wedge resection of left upper lobe mass   Mitral valve prolapse    "no ORs" (07/28/2015)   Osteopenia    Pneumonia 1970's; 06/2005   Sleep apnea dx'd 06/2015   on oxygen at nite at 2L/    Squamous cell skin cancer     Past Surgical History:  Procedure Laterality Date   ABDOMINAL HYSTERECTOMY  1988   COLONOSCOPY WITH PROPOFOL N/A 06/15/2017   Procedure: COLONOSCOPY WITH PROPOFOL;  Surgeon: Donna Ada, MD;  Location: [facility];  Service: Endoscopy;  Laterality: N/A;   HAMMER TOE SURGERY Right ~ 2010   SALPINGOOPHORECTOMY Left 1988   SKIN CANCER EXCISION     "I've had severl cut off RLE; left foreqrm, nose under nose"   THORACOTOMY Left 06/2005    THORACOTOMY/LOBECTOMY  09/2009; 11/2009   left; right    Family History  Problem Relation Age of Onset   Graves' disease Daughter    Breast cancer Maternal Aunt    Heart disease Maternal Aunt    Hypertension Mother    Arthritis Maternal Grandmother     Social History Social History   Tobacco Use   Smoking status: Former Smoker    Packs/day: 0.75    Years: 30.00    Pack years: 22.50    Types: Cigarettes    Quit date: 09/11/2004    Years since quitting: 14.6   Smokeless tobacco: Never Used  Substance Use Topics   Alcohol use: Yes    Alcohol/week: 7.0 standard drinks    Types: 7 Shots of liquor per week    Comment: 1-2 drinks nightly    Drug use: No    Allergies  Allergen Reactions   Doxycycline Nausea  Only   Erythromycin Nausea Only   Sulfonamide Derivatives Hives    Current Outpatient Medications  Medication Sig Dispense Refill   acetaminophen (TYLENOL) 325 MG tablet Take by mouth.     albuterol (PROAIR HFA) 108 (90 Base) MCG/ACT inhaler Inhale 1 puff into the lungs every 6 (six) hours as needed for wheezing or shortness of breath.     albuterol (PROVENTIL) (2.5 MG/3ML) 0.083% nebulizer solution USE 1 VIAL (3 ML OR 2.5 MG TOTAL) VIA NEBULIZER EVERY 4 HOURS AS NEEDED FOR WHEEZING OR SHORTNESS OF BREATH 450 mL 3   ALPRAZolam (XANAX) 0.5 MG tablet TAKE 1 TABLET BY MOUTH EVERY DAY AT BEDTIME AS NEEDED FOR ANXIETY OR INSOMNIA 90 tablet 0   b complex-C-folic acid 1 MG capsule Take 1 capsule by mouth daily.       buPROPion (WELLBUTRIN XL) 150 MG 24 hr tablet TAKE 1 TABLET DAILY 90 tablet 3   Calcium Carbonate-Vit D-Min (CALCIUM 1200 PO) Take by mouth.     Cyanocobalamin 1000 MCG CAPS Take by mouth.     doxazosin (CARDURA) 4 MG tablet Take 1 tablet (4 mg total) by mouth daily. 90 tablet 4   DULoxetine (CYMBALTA) 60 MG capsule TAKE 1 CAPSULE DAILY 90 capsule 3   fluticasone (FLONASE) 50 MCG/ACT nasal spray Place 2 sprays into both nostrils daily. 48 g 4     folic acid (FOLVITE) 1 MG tablet Take by mouth.     levothyroxine (SYNTHROID) 112 MCG tablet Take 1 tablet (112 mcg total) by mouth daily. 90 tablet 3   losartan-hydrochlorothiazide (HYZAAR) 100-25 MG tablet Take 1 tablet by mouth daily. 90 tablet 4   mometasone (ELOCON) 0.1 % cream Apply 1 application topically daily. 45 g 0   nystatin (MYCOSTATIN) 100000 UNIT/ML suspension Take 5 mLs (500,000 Units total) by mouth 4 (four) times daily. Swish and swallow. 120 mL 0   Omega-3 1000 MG CAPS      omeprazole (PRILOSEC) 20 MG capsule Take 1 capsule (20 mg total) by mouth 2 (two) times daily before a meal. 90 capsule 3   OXYGEN 2lpm with sleep     Phenylephrine HCl (WAL-PHED PE PO) Take 1 tablet by mouth at bedtime.     pravastatin (PRAVACHOL) 40 MG tablet TAKE 1 TABLET DAILY (STOP SIMVASTATIN) 90 tablet 4   traZODone (DESYREL) 50 MG tablet TAKE 1 TABLET AT BEDTIME AS NEEDED FOR SLEEP 90 tablet 3   TRELEGY ELLIPTA 100-62.5-25 MCG/INH AEPB      valACYclovir (VALTREX) 500 MG tablet TAKE 1 TABLET(500 MG) BY MOUTH THREE TIMES DAILY 21 tablet 2   VITAMIN D, CHOLECALCIFEROL, PO Take 1 tablet by mouth daily.     No current facility-administered medications for this visit.     Review of Systems  Constitutional: positive for fatigue Eyes: negative Ears, nose, mouth, throat, and face: negative Respiratory: positive for cough and dyspnea on exertion Cardiovascular: negative Gastrointestinal: negative Genitourinary:negative Integument/breast: negative Hematologic/lymphatic: negative Musculoskeletal:positive for back pain Neurological: negative Behavioral/Psych: negative Endocrine: negative Allergic/Immunologic: negative  Physical Exam  WEX:HBZJI, healthy, no distress, well nourished and well developed SKIN: skin color, texture, turgor are normal, no rashes or significant lesions HEAD: Normocephalic, No masses, lesions, tenderness or abnormalities EYES: normal, PERRLA,  Conjunctiva are pink and non-injected EARS: External ears normal, Canals clear OROPHARYNX:no exudate, no erythema and lips, buccal mucosa, and tongue normal  NECK: supple, no adenopathy, no JVD LYMPH:  no palpable lymphadenopathy, no hepatosplenomegaly BREAST:not examined LUNGS: clear to auscultation , and palpation  HEART: regular rate & rhythm, no murmurs and no gallops ABDOMEN:abdomen soft, non-tender, normal bowel sounds and no masses or organomegaly BACK: No CVA tenderness, Range of motion is normal EXTREMITIES:no joint deformities, effusion, or inflammation, no edema  NEURO: alert & oriented x 3 with fluent speech, no focal motor/sensory deficits  PERFORMANCE STATUS: ECOG 1  LABORATORY DATA: Lab Results  Component Value Date   WBC 10.7 (H) 05/06/2019   HGB 13.8 05/06/2019   HCT 40.1 05/06/2019   MCV 103.6 (H) 05/06/2019   PLT 267 05/06/2019      Chemistry      Component Value Date/Time   NA 137 01/28/2019 1008   K 5.3 01/28/2019 1008   CL 97 (L) 01/28/2019 1008   CO2 29 01/28/2019 1008   BUN 29 (H) 01/28/2019 1008   CREATININE 0.83 01/28/2019 1008      Component Value Date/Time   CALCIUM 10.0 01/28/2019 1008   ALKPHOS 67 11/03/2016 1136   AST 24 01/28/2019 1008   ALT 19 01/28/2019 1008   BILITOT 0.4 01/28/2019 1008       RADIOGRAPHIC STUDIES: No results found.  ASSESSMENT: This is a very pleasant 78 years old white female with history of a bilateral stage Ia non-small cell lung cancer involving the left upper lobe as well as right upper lobe status post wedge resection of the 2 lesions in 2011 under the care of [name].  This was followed by systemic chemotherapy with carboplatin, Alimta and Avastin for 6 cycles at [facility] completed in 2011.  She also underwent stereotactic radiotherapy to the enlarging left upper lobe lung nodule in 2014.  The patient also has multiple bilateral groundglass opacities that has been followed  closely.   PLAN: I had a lengthy discussion with the patient today about her current condition.  She has no imaging studies for the last 18 months. I recommended for the patient to have repeat CT scan of the chest, abdomen pelvis performed next week to rule out any disease progression. If the scan showed no concerning findings for disease progression, I will see her back for follow-up visit in 6 months with repeat imaging studies. The patient agreed to the current plan. She was advised to call immediately if she has any concerning symptoms in the interval. For the other medical condition, she will continue her routine follow-up visit and evaluation by her primary care physician. The patient voices understanding of current disease status and treatment options and is in agreement with the current care plan.  All questions were answered. The patient knows to call the clinic with any problems, questions or concerns. We can certainly see the patient much sooner if necessary.  Thank you so much for allowing me to participate in the care of [name]. I will continue to follow up the patient with you and assist in her care.  I spent 40 minutes counseling the patient face to face. The total time spent in the appointment was 60 minutes.  Disclaimer: This note was dictated with voice recognition software. Similar sounding words can inadvertently be transcribed and may not be corrected upon review.   Eilleen Kempf May 06, 2019, 2:39 PM

## 2019-05-07 ENCOUNTER — Telehealth: Payer: Self-pay | Admitting: Internal Medicine

## 2019-05-07 NOTE — Telephone Encounter (Signed)
Scheduled appt per 8/25 los - mailed letter to pt with appt date and time

## 2019-05-08 ENCOUNTER — Encounter: Payer: Self-pay | Admitting: *Deleted

## 2019-05-09 ENCOUNTER — Other Ambulatory Visit: Payer: Self-pay | Admitting: Family Medicine

## 2019-05-09 ENCOUNTER — Telehealth: Payer: Self-pay | Admitting: *Deleted

## 2019-05-09 ENCOUNTER — Telehealth: Payer: Self-pay | Admitting: Family Medicine

## 2019-05-09 MED ORDER — CEPHALEXIN 500 MG PO CAPS: 500.0000 mg | ORAL_CAPSULE | Freq: Three times a day (TID) | ORAL | 0 refills | Status: DC

## 2019-05-09 NOTE — Telephone Encounter (Signed)
Spoke with patient and informed her that Dr.Pickard sent in Keflex. Patient verbalized understanding.

## 2019-05-09 NOTE — Telephone Encounter (Signed)
Oncology Nurse Navigator Documentation  Oncology Nurse Navigator Flowsheets 05/09/2019  Navigator Location [facility]  Referral Date to RadOnc/MedOnc -  Navigator Encounter Type Telephone/I received a message that patient had questions about plan of care.  I called and spoke with her.  I clarified.  No other questions at this time.   Telephone Outgoing Call  Treatment Phase -  Barriers/Navigation Needs Coordination of Care;Education  Education Other  Interventions Coordination of Care;Education  Coordination of Care Other  Education Method Verbal  Acuity Level 2  Time Spent with Patient 15

## 2019-05-09 NOTE — Telephone Encounter (Signed)
I sent in keflex.

## 2019-05-09 NOTE — Telephone Encounter (Signed)
Patient called in stating that she has a temp of 99.4, low back, fatigue with urinary frequency. States symptoms started yesterday. The last time she felt like this she had a UTI. Patient would like to know if we can call in something. States her husband has a surgery scheduled for Monday and and he has a series of appointments next week and she does not want to feel bad. Please advise? Would you like her to drop off urine sample?

## 2019-05-13 ENCOUNTER — Ambulatory Visit (HOSPITAL_COMMUNITY)
Admission: RE | Admit: 2019-05-13 | Discharge: 2019-05-13 | Disposition: A | Discharge status: Home / Self Care | Payer: Medicare Other | Source: Ambulatory Visit | Attending: Internal Medicine | Admitting: Internal Medicine

## 2019-05-13 ENCOUNTER — Other Ambulatory Visit: Payer: Self-pay

## 2019-05-13 ENCOUNTER — Ambulatory Visit (INDEPENDENT_AMBULATORY_CARE_PROVIDER_SITE_OTHER): Payer: Medicare Other

## 2019-05-13 DIAGNOSIS — C3490 Malignant neoplasm of unspecified part of unspecified bronchus or lung: Secondary | ICD-10-CM | POA: Diagnosis not present

## 2019-05-13 DIAGNOSIS — D179 Benign lipomatous neoplasm, unspecified: Secondary | ICD-10-CM | POA: Diagnosis not present

## 2019-05-13 DIAGNOSIS — K5730 Diverticulosis of large intestine without perforation or abscess without bleeding: Secondary | ICD-10-CM | POA: Diagnosis not present

## 2019-05-13 DIAGNOSIS — Z23 Encounter for immunization: Secondary | ICD-10-CM

## 2019-05-13 MED ORDER — IOHEXOL 300 MG/ML  SOLN
100.0000 mL | Freq: Once | INTRAMUSCULAR | Status: AC | PRN
  Administered 2019-05-13: 100 mL via INTRAVENOUS

## 2019-05-13 MED ORDER — SODIUM CHLORIDE (PF) 0.9 % IJ SOLN
INTRAMUSCULAR | Status: AC
  Filled 2019-05-13: qty 50

## 2019-05-13 NOTE — Progress Notes (Signed)
Patient came in today to receive her annual flu vaccine. Patient was given fluad in the right deltoid. She tolerated well. VIS given

## 2019-05-17 ENCOUNTER — Encounter: Payer: Self-pay | Admitting: Family Medicine

## 2019-05-20 MED ORDER — AMPHETAMINE-DEXTROAMPHET ER 15 MG PO CP24: 15.0000 mg | ORAL_CAPSULE | ORAL | 0 refills | Status: DC

## 2019-05-20 NOTE — Telephone Encounter (Signed)
Ok to refill??  Last office visit 04/23/2019.  Last refill 01/14/2019.

## 2019-05-27 DIAGNOSIS — M4180 Other forms of scoliosis, site unspecified: Secondary | ICD-10-CM | POA: Diagnosis not present

## 2019-05-27 DIAGNOSIS — M5136 Other intervertebral disc degeneration, lumbar region: Secondary | ICD-10-CM | POA: Diagnosis not present

## 2019-05-27 DIAGNOSIS — M545 Low back pain: Secondary | ICD-10-CM | POA: Diagnosis not present

## 2019-06-16 ENCOUNTER — Other Ambulatory Visit: Payer: Self-pay

## 2019-06-16 ENCOUNTER — Ambulatory Visit (INDEPENDENT_AMBULATORY_CARE_PROVIDER_SITE_OTHER): Payer: Medicare Other | Admitting: Family Medicine

## 2019-06-16 ENCOUNTER — Encounter: Payer: Self-pay | Admitting: Family Medicine

## 2019-06-16 VITALS — BP 162/92 | HR 90 | Temp 97.6°F | Resp 20 | Ht 65.0 in | Wt 177.0 lb

## 2019-06-16 DIAGNOSIS — F339 Major depressive disorder, recurrent, unspecified: Secondary | ICD-10-CM | POA: Diagnosis not present

## 2019-06-16 DIAGNOSIS — Z8719 Personal history of other diseases of the digestive system: Secondary | ICD-10-CM | POA: Diagnosis not present

## 2019-06-16 MED ORDER — TRIAMCINOLONE ACETONIDE 0.1 % MT PSTE: 1.0000 "application " | PASTE | Freq: Two times a day (BID) | OROMUCOSAL | 12 refills | Status: DC

## 2019-06-16 NOTE — Progress Notes (Signed)
Subjective:    Patient ID: [name], female    DOB: 11-30-1940, 78 y.o.   MRN: [id number]  HPI  01/28/19 Patient presents today with shortness of breath with activity.  She states that she just does not feel well.  However she states that since January she feels very short of breath with any activity that she does.  Currently she is wearing oxygen at night however she feels that she would benefit from oxygen during the daytime with activity because after walking short distances, she begins to wheeze and feel very short of breath.  She denies any chest pain.  She is using Bevespi 2 puffs inhaled twice daily.  She denies any seasonal allergies.  She denies any acid reflux and she is on a proton pump inhibitor.  She denies any recent fever or chills or purulent sputum.  She does have increased urinary frequency and urgency and is concerned that she may have a urinary tract infection adding to her symptomatology making her feel worse however her urinalysis today shows only trace ketones and is otherwise negative for any leukocyte esterase, nitrites, or blood.  Patient is satting 97% on room air.  I walked with the patient around our clinic.  After 1 lap which is approximately 60 feet, the patient's oxygen saturation was 95%, after 2 laps her oxygen saturation was 93% and after the third lap her oxygen saturation was down to 91%.  She was wheezing on expiration however the wheezing sounded more akin to upper airway sounds rather than true deep bronchospasm.  She does have tachycardia.  Heart rate is 100 at rest and with activity rises to 110 bpm.    Review of her medical records show that she had an echocardiogram of the heart performed in 2010.  At that time ejection fraction was 60 to 65%.  However I can find no other echocardiogram since that time.  She has a history of lung cancer currently managed at [facility].  The last CT scan of the chest in epic however was many years ago.  I do not see a recent  chest x-ray or imaging of the lung.  To get a baseline of her COPD I did perform pulmonary function test.  Patient's FEV1 was 1.25 L which is 62% of predicted.  Patient's FVC is 2.16 L which is 79% of predicted.  Her FEV1 to FVC ratio was 58%.  Therefore she has moderate obstruction stage II borderline stage III.  At that time, my plan was: Patient has borderline stage III COPD and may benefit from switching Bevespi to Trelegy.  She does have mild oxygen drops with ambulation but it does not require oxygen.  Her shortness of breath however is out of proportion to her exam today.  She is very winded simply walking around the office she is also very tachycardic.  Given her recent trip to [place] and plane ride and history of lung cancer, pulmonary embolism is on the differential diagnosis particular with her tachycardia.  Therefore I will check a d-dimer and if positive a CT scan of the lung to rule out PE.  Also get an echocardiogram to evaluate for any evidence of congestive heart failure.  I will check a CBC, CMP, and TSH as well.  Once I have the above work-up completed if no other explanation is found, I will switch the patient from [facility] to Trelegy.  Obtain baseline chest x-ray today   04/23/19 Patient presents with several concerns today.  First she would like a referral to an oncologist in [place].  She has been seeing an oncologist at [facility].  Past medical history is significant for non-small cell lung cancer adenocarcinoma diagnosed in 2011.  This was found in the left upper lobe.  VATS was converted to an upper lobe resection performed by Dr. Arlyce Dice.  Mass was removed in its entirety.  Lymph nodes were negative for metastatic spread.  Patient completed chemotherapy and has had negative scans performed at [facility] since.  However she would like to have an oncologist in [place] to follow her case.  She does not want to have to travel to [facility] again.  Patient has now been  cancer free for 9 years.  However she would like a second opinion from oncology.  She also reports dyspnea on exertion and wants an oxygen concentrator that she can carry with her portably to help with her dyspnea.  She is 95% on room air.  With ambulation walking more than 200 feet she drops only to 94%.  Although tachycardic, her oxygen desaturation does not qualify for portable oxygen.  Third the patient wants an evaluation for memory loss.  She states that she feels like she is having hard time coming up with words.  Mini-Mental Status exam is performed today and the patient scored 30 out of 30 on Mini-Mental status exam.  However she does have ADD and the patient has been off her medicines for ADD for over 4 months.  She jumps from topic to topic rapidly.  Her conversation is often hard to follow.  She tends to lose focus.  I believe a lot of her memory issues may be due to her ADD and lack of attention coupled with normal age-related changes rather than true underlying dementia.  We had a long discussion about this as well.  Fourth she reports itching in her hands and feet.  She states that she has a rash.  However visibly on exam there is no visible rash at all.  There are some excoriations due to scratching.  The itching is limited to the hands and feet.  At that time, my plan was: 1. Itching There is no visible rash today.  I believe the itching is neuropathic in nature.  This could be due to peripheral neuropathy versus anxiety.  I have recommended trying Elocon cream once daily as needed for itching - mometasone (ELOCON) 0.1 % cream; Apply 1 application topically daily.  Dispense: 45 g; Refill: 0  2. Dyspnea on exertion Multifactorial however the patient does not qualify for portable oxygen.  3. History of lung cancer Patient would like to establish with an oncologist here in [place] that she can see annually rather than go to [facility] for follow-up scans.  I will consult oncology however the  patient is now 9 years removed and therefore this may not be necessary.  I will schedule a consultation with the oncologist to get their opinion however I believe the patient may no longer require annual surveillance  4. Attention deficit hyperactivity disorder (ADHD), combined type Mini-Mental status exam is normal.  I do believe the patient may have some normal age-related short-term memory impairment coupled with ADD.  I believe this is causing more of her memory issues than true underlying dementia.  At the present time I see no role for medication.  I reassured the patient.  I did offer the patient cognitive testing through [facility] neurology but she politely declined.  06/16/19 Patient  presents today with 2 concerns.  First she has recurrent ulcers in her mouth.  She states that they occur in multiple different locations.  Right now she reports that healing ulcer at the crease between her gum and her buccal mucosa on the lower right side.  She also has a similar ulcer in the crease between her gum and the buccal mucosa on the upper right side.  She states at times they will occur on her tongue.  At other times they will occur on the mucosa of the inner lower lip.  They last 1 to 2 weeks and then spontaneously resolve.  She also reports worsening stress and anxiety and depression.  She has been on a combination of Cymbalta and Wellbutrin for more than 10 years.  However she reports that with her husband's worsening dementia, she is having increasingly difficult time sleeping.  She reports increasing anxiety.  She often has to take 2 Xanax's at night to help her sleep.  Recently she independently tried alternating trazodone that she had an old prescription for with Xanax.  She is also combine the medications with alcohol to help her sleep.  She reports depression.  She reports anhedonia.  She reports increasing anxiety.  She denies any suicidal ideation hallucinations or homicidal ideation.  Past  Medical History:  Diagnosis Date   ADD (attention deficit disorder)    Allergic rhinitis    Anemia    Anxiety    Arrhythmia    Arthritis    "hands, neck, right shoulder" (07/28/2015)   Aseptic necrosis ([place])    Asthma dxc'd 05/2015   Basal cell cancer    Bleeding stomach ulcer ~ 06/2005   COPD (chronic obstructive pulmonary disease) (HCC)    Depression    Diverticula of colon    Dyspnea    Esophagitis    Gastric ulcer with hemorrhage    GERD (gastroesophageal reflux disease)    Heart murmur    History of blood transfusion 06/2005   "related to pneumonia"   History of kidney stones    Hyperlipidemia    Hypertension    Hypothyroidism    Kidney stones    Lung cancer ([place])    Non small cell lung cancer, adenocarcinoma with BAC 09/2009, VATS converted to wedge resection of left upper lobe mass   Mitral valve prolapse    "no ORs" (07/28/2015)   Osteopenia    Pneumonia 1970's; 06/2005   Sleep apnea dx'd 06/2015   on oxygen at nite at 2L/[place]    Squamous cell skin cancer    Past Surgical History:  Procedure Laterality Date   ABDOMINAL HYSTERECTOMY  1988   COLONOSCOPY WITH PROPOFOL N/A 06/15/2017   Procedure: COLONOSCOPY WITH PROPOFOL;  Surgeon: Carol Ada, MD;  Location: [facility];  Service: Endoscopy;  Laterality: N/A;   HAMMER TOE SURGERY Right ~ 2010   SALPINGOOPHORECTOMY Left 1988   SKIN CANCER EXCISION     "I've had severl cut off RLE; left foreqrm, nose under nose"   THORACOTOMY Left 06/2005   THORACOTOMY/LOBECTOMY  09/2009; 11/2009   left; right   Current Outpatient Medications on File Prior to Visit  Medication Sig Dispense Refill   acetaminophen (TYLENOL) 325 MG tablet Take by mouth.     albuterol (PROAIR HFA) 108 (90 Base) MCG/ACT inhaler Inhale 1 puff into the lungs every 6 (six) hours as needed for wheezing or shortness of breath.     albuterol (PROVENTIL) (2.5 MG/3ML) 0.083% nebulizer solution USE 1 VIAL (3 ML  OR 2.5  MG TOTAL) VIA NEBULIZER EVERY 4 HOURS AS NEEDED FOR WHEEZING OR SHORTNESS OF BREATH 450 mL 3   ALPRAZolam (XANAX) 0.5 MG tablet TAKE 1 TABLET BY MOUTH EVERY DAY AT BEDTIME AS NEEDED FOR ANXIETY OR INSOMNIA 90 tablet 0   amphetamine-dextroamphetamine (ADDERALL XR) 15 MG 24 hr capsule Take 1 capsule by mouth every morning. 30 capsule 0   b complex-C-folic acid 1 MG capsule Take 1 capsule by mouth daily.       buPROPion (WELLBUTRIN XL) 150 MG 24 hr tablet TAKE 1 TABLET DAILY 90 tablet 3   Calcium Carbonate-Vit D-Min (CALCIUM 1200 PO) Take by mouth.     cephALEXin (KEFLEX) 500 MG capsule Take 1 capsule (500 mg total) by mouth 3 (three) times daily. 21 capsule 0   Cyanocobalamin 1000 MCG CAPS Take by mouth.     doxazosin (CARDURA) 4 MG tablet Take 1 tablet (4 mg total) by mouth daily. 90 tablet 4   DULoxetine (CYMBALTA) 60 MG capsule TAKE 1 CAPSULE DAILY 90 capsule 3   fluticasone (FLONASE) 50 MCG/ACT nasal spray Place 2 sprays into both nostrils daily. 48 g 4   folic acid (FOLVITE) 1 MG tablet Take by mouth.     levothyroxine (SYNTHROID) 112 MCG tablet Take 1 tablet (112 mcg total) by mouth daily. 90 tablet 3   losartan-hydrochlorothiazide (HYZAAR) 100-25 MG tablet Take 1 tablet by mouth daily. 90 tablet 4   mometasone (ELOCON) 0.1 % cream Apply 1 application topically daily. 45 g 0   nystatin (MYCOSTATIN) 100000 UNIT/ML suspension Take 5 mLs (500,000 Units total) by mouth 4 (four) times daily. Swish and swallow. 120 mL 0   Omega-3 1000 MG CAPS      omeprazole (PRILOSEC) 20 MG capsule Take 1 capsule (20 mg total) by mouth 2 (two) times daily before a meal. 90 capsule 3   OXYGEN 2lpm with sleep     Phenylephrine HCl (WAL-PHED PE PO) Take 1 tablet by mouth at bedtime.     pravastatin (PRAVACHOL) 40 MG tablet TAKE 1 TABLET DAILY (STOP SIMVASTATIN) 90 tablet 4   traZODone (DESYREL) 50 MG tablet TAKE 1 TABLET AT BEDTIME AS NEEDED FOR SLEEP 90 tablet 3   TRELEGY ELLIPTA 100-62.5-25  MCG/INH AEPB      valACYclovir (VALTREX) 500 MG tablet TAKE 1 TABLET(500 MG) BY MOUTH THREE TIMES DAILY 21 tablet 2   VITAMIN D, CHOLECALCIFEROL, PO Take 1 tablet by mouth daily.     No current facility-administered medications on file prior to visit.    Allergies  Allergen Reactions   Doxycycline Nausea Only   Erythromycin Nausea Only   Sulfonamide Derivatives Hives   Social History   Socioeconomic History   Marital status: Married    Spouse name: Not on file   Number of children: 3   Years of education: Not on file   Highest education level: Not on file  Occupational History   Occupation: retired Optician, dispensing: RETIRED  Social Needs   Financial resource strain: Not on file   Food insecurity    Worry: Not on file    Inability: Not on file   Transportation needs    Medical: Not on file    Non-medical: Not on file  Tobacco Use   Smoking status: Former Smoker    Packs/day: 0.75    Years: 30.00    Pack years: 22.50    Types: Cigarettes    Quit date: 09/11/2004    Years since  quitting: 14.7   Smokeless tobacco: Never Used  Substance and Sexual Activity   Alcohol use: Yes    Alcohol/week: 7.0 standard drinks    Types: 7 Shots of liquor per week    Comment: 1-2 drinks nightly    Drug use: No   Sexual activity: Yes  Lifestyle   Physical activity    Days per week: Not on file    Minutes per session: Not on file   Stress: Not on file  Relationships   Social connections    Talks on phone: Not on file    Gets together: Not on file    Attends religious service: Not on file    Active member of club or organization: Not on file    Attends meetings of clubs or organizations: Not on file    Relationship status: Not on file   Intimate partner violence    Fear of current or ex partner: Not on file    Emotionally abused: Not on file    Physically abused: Not on file    Forced sexual activity: Not on file  Other Topics Concern   Not on file    Social History Narrative   Not on file      Review of Systems  All other systems reviewed and are negative.      Objective:   Physical Exam Constitutional:      General: She is not in acute distress.    Appearance: Normal appearance. She is normal weight. She is not ill-appearing or toxic-appearing.  HENT:     Nose: Nose normal. No congestion or rhinorrhea.  Cardiovascular:     Rate and Rhythm: Normal rate and regular rhythm.     Heart sounds: Normal heart sounds.  Pulmonary:     Effort: Pulmonary effort is normal. No respiratory distress.     Breath sounds: No stridor. No wheezing, rhonchi or rales.  Musculoskeletal:     Right lower leg: No edema.     Left lower leg: No edema.  Neurological:     Mental Status: She is alert.           Assessment & Plan:  H/O oral aphthous ulcers  Depression, recurrent ([place])  Based on her history, I believe the patient is suffering from recurrent aphthous ulcers.  I recommended trying triamcinolone 0.1% dental paste applied twice a day to the affected areas for 2 to 3 days until the ulcers heal.  If this works this can be used sparingly as needed.  Thorough visual examination of the oral mucosa reveals no visible ulcer today on exam.  Patient states that the ulcer that she was reporting to me has improved since when she made the appointment.  Recommended decreasing Cymbalta to 60 mg every other day for 1 week then every third day for 1 week and then stop altogether.  Meanwhile begin Trintellix 5 mg a day for 2 weeks and increase to 10 mg a day after she has discontinued Cymbalta.  Continue Wellbutrin at his current dose.  Reassess in 1 month.  I counseled the patient not to combine Xanax with alcohol or to alternate medications.  I recommended that she stop trazodone altogether to avoid polypharmacy.

## 2019-06-18 ENCOUNTER — Ambulatory Visit (INDEPENDENT_AMBULATORY_CARE_PROVIDER_SITE_OTHER): Payer: Medicare Other | Admitting: Family Medicine

## 2019-06-18 ENCOUNTER — Other Ambulatory Visit: Payer: Self-pay

## 2019-06-18 ENCOUNTER — Encounter: Payer: Self-pay | Admitting: Family Medicine

## 2019-06-18 VITALS — BP 144/84 | HR 90 | Temp 98.4°F | Resp 20 | Ht 65.0 in | Wt 182.0 lb

## 2019-06-18 DIAGNOSIS — J449 Chronic obstructive pulmonary disease, unspecified: Secondary | ICD-10-CM

## 2019-06-18 DIAGNOSIS — J302 Other seasonal allergic rhinitis: Secondary | ICD-10-CM | POA: Diagnosis not present

## 2019-06-18 DIAGNOSIS — G4734 Idiopathic sleep related nonobstructive alveolar hypoventilation: Secondary | ICD-10-CM | POA: Diagnosis not present

## 2019-06-18 MED ORDER — PREDNISONE 10 MG PO TABS: ORAL_TABLET | ORAL | 0 refills | Status: DC

## 2019-06-18 NOTE — Progress Notes (Signed)
SpO2 on RA HR  2:21- at rest  96  98 2:22- walking  92  102 2:23- walking  92  108 2:26- at rest  97  90

## 2019-06-18 NOTE — Progress Notes (Signed)
Subjective:    Patient ID: [name], female    DOB: 31-Aug-1941, 78 y.o.   MRN: [id number]  Patient presents for SOB/ WHEEZING (wanted to check O2 levels since she is having issues with COPD)   Pt walked in stating she couldn't breathe that she has had trouble for the past 2 weeks She has severe COPD on oxygen therapy, followed by pulmonary she is not qualify for oxygen and she has never shown hypoxia.  She will get episodes where she does get short of breath at anywhere.  She is her albuterol inhaler this morning which helped.  She is also on Trelegy.  She has occasional cough is not had any significant wheezing.  Has not had any fever.  She gets stuffy nose typically uses nasal rinse at nighttime which helps.  Today try get an decongestant and allergy medication but did not see much improvement with this over the past couple weeks.  She feels like she has had nightmares and vivid dreams because she is not getting enough oxygen at nighttime. But then states she is on oxygen at bedtime  Explain one episode where she had enuresis.  When I met her in the hallway she still had oxygen saturation on with a nurse her heart rate was about 100 she was talking at the same time is breathing heavily her oxygen saturation was at 92.  Once I got her calm down her oxygen saturation was 96% at rest heart rate came down to 90   Review Of Systems:  GEN- denies fatigue, fever, weight loss,weakness, recent illness HEENT- denies eye drainage, change in vision, nasal discharge, CVS- denies chest pain, palpitations RESP-+ SOB,+ cough, wheeze ABD- denies N/V, change in stools, abd pain GU- denies dysuria, hematuria, dribbling, incontinence MSK- denies joint pain, muscle aches, injury Neuro- denies headache, dizziness, syncope, seizure activity       Objective:    BP (!) 144/84   Pulse 90   Temp 98.4 F (36.9 C) (Oral)   Resp 20   Ht _0  (1.651 m)   Wt 182 lb (82.6 kg)   SpO2 96% Comment: RA-  dropped to 92 with exertion- back to 97 with rest  BMI 30.29 kg/m  GEN- NAD, alert and oriented x3 HEENT- PERRL, EOMI, non injected sclera, pink conjunctiva, MMM, oropharynx clear, nares clear , no sinus tenderness  Neck- Supple, , no JVD, no LAD CVS- RRR, no murmur RESP-CTAB Psych- anxious appearing, not depressed appearing  EXT- No edema Pulses- Radial  2+        Assessment & Plan:      Problem List Items Addressed This Visit      Unprioritized   Allergic rhinitis due to allergen   COPD GOLD II    She has known severe COPD she has nocturnal hypoxia.  She also has history of lung cancer she had recent CT scan did not show any active lung cancer though she does have some lung nodules that they want to follow.  Even with a 2-minute walk her saturations never dropped below 92%.  At this time I cannot qualify her for portable oxygen.  She will continue use of her nighttime oxygen.  Also recommend she try humidifier in her bedroom she states that she is getting nasal congestion.  Advised her not to use a decongestant at this time especially with her other medications.  I did give her a prescription for prednisone to have on hand if she does get chest  tightness with wheezing similar to her previous COPD exacerbations.  Once we got her calm down her anxiety came down she was perfectly fine at rest.  No other medication changes were made.  She just seen her PCP last week who is already started to change her anxiolytic/depression meds      Relevant Medications   predniSONE (DELTASONE) 10 MG tablet   Nocturnal hypoxia - Primary      Note: This dictation was prepared with Dragon dictation along with smaller phrase technology. Any transcriptional errors that result from this process are unintentional.

## 2019-06-18 NOTE — Assessment & Plan Note (Signed)
She has known severe COPD she has nocturnal hypoxia.  She also has history of lung cancer she had recent CT scan did not show any active lung cancer though she does have some lung nodules that they want to follow.  Even with a 2-minute walk her saturations never dropped below 92%.  At this time I cannot qualify her for portable oxygen.  She will continue use of her nighttime oxygen.  Also recommend she try humidifier in her bedroom she states that she is getting nasal congestion.  Advised her not to use a decongestant at this time especially with her other medications.  I did give her a prescription for prednisone to have on hand if she does get chest tightness with wheezing similar to her previous COPD exacerbations.  Once we got her calm down her anxiety came down she was perfectly fine at rest.  No other medication changes were made.  She just seen her PCP last week who is already started to change her anxiolytic/depression meds

## 2019-06-18 NOTE — Patient Instructions (Signed)
Take antihistamine Use the nasal saline and flonase Prednisone if you needed  F/U as needed

## 2019-06-30 ENCOUNTER — Encounter: Payer: Self-pay | Admitting: Family Medicine

## 2019-06-30 NOTE — Telephone Encounter (Signed)
Pt is requesting refill on Xanax   LOV: 06/18/19  LRF:   04/03/19

## 2019-07-01 DIAGNOSIS — M5136 Other intervertebral disc degeneration, lumbar region: Secondary | ICD-10-CM | POA: Diagnosis not present

## 2019-07-01 MED ORDER — ALPRAZOLAM 0.5 MG PO TABS: ORAL_TABLET | ORAL | 0 refills | Status: DC

## 2019-07-16 ENCOUNTER — Other Ambulatory Visit: Payer: Self-pay | Admitting: Family Medicine

## 2019-07-16 NOTE — Telephone Encounter (Signed)
Pt requesting refill on Adderall      LOV: 06/18/19  LRF:   05/20/19

## 2019-07-17 ENCOUNTER — Encounter: Payer: Self-pay | Admitting: Family Medicine

## 2019-07-17 MED ORDER — AMPHETAMINE-DEXTROAMPHET ER 15 MG PO CP24: 15.0000 mg | ORAL_CAPSULE | ORAL | 0 refills | Status: DC

## 2019-07-24 ENCOUNTER — Encounter: Payer: Self-pay | Admitting: Family Medicine

## 2019-07-28 DIAGNOSIS — H04211 Epiphora due to excess lacrimation, right lacrimal gland: Secondary | ICD-10-CM | POA: Diagnosis not present

## 2019-07-28 DIAGNOSIS — H04123 Dry eye syndrome of bilateral lacrimal glands: Secondary | ICD-10-CM | POA: Diagnosis not present

## 2019-07-28 DIAGNOSIS — H40013 Open angle with borderline findings, low risk, bilateral: Secondary | ICD-10-CM | POA: Diagnosis not present

## 2019-07-28 DIAGNOSIS — Z961 Presence of intraocular lens: Secondary | ICD-10-CM | POA: Diagnosis not present

## 2019-08-27 ENCOUNTER — Encounter: Payer: Self-pay | Admitting: Family Medicine

## 2019-08-27 NOTE — Telephone Encounter (Signed)
Pt requesting refill on Adderall      LOV: 07/22/2019 LRF:   07/17/19

## 2019-08-28 MED ORDER — AMPHETAMINE-DEXTROAMPHET ER 15 MG PO CP24: 15.0000 mg | ORAL_CAPSULE | ORAL | 0 refills | Status: DC

## 2019-09-01 ENCOUNTER — Encounter: Payer: Self-pay | Admitting: Family Medicine

## 2019-09-02 ENCOUNTER — Ambulatory Visit (INDEPENDENT_AMBULATORY_CARE_PROVIDER_SITE_OTHER): Payer: Medicare Other | Admitting: Family Medicine

## 2019-09-02 DIAGNOSIS — R1030 Lower abdominal pain, unspecified: Secondary | ICD-10-CM | POA: Diagnosis not present

## 2019-09-02 MED ORDER — CIPROFLOXACIN HCL 500 MG PO TABS: 500.0000 mg | ORAL_TABLET | Freq: Two times a day (BID) | ORAL | 0 refills | Status: DC

## 2019-09-02 MED ORDER — METRONIDAZOLE 500 MG PO TABS: 500.0000 mg | ORAL_TABLET | Freq: Two times a day (BID) | ORAL | 0 refills | Status: DC

## 2019-09-02 NOTE — Progress Notes (Signed)
Subjective:    Patient ID: [name], female    DOB: [birth year], 78 y.o.   MRN: [id number]  HPI Patient is being seen today as a telephone visit.  Phone call began at 12:00.  Phone call concluded at 1208.  She states that 1 week ago she developed loose stool on occasion.  She also developed fatigue.  She took some Pepto-Bismol as well as some Imodium and the diarrhea stopped.  However she states that she has not had a bowel movement in the last 4 to 5 days.  Her belly now feels bloated.  She reports a dull soreness to her abdomen all throughout her abdomen.  It does not localize into one area.  She does report feeling pressure in her lower abdomen.  She rates the pain as a 3 on a scale of 1-10.  She denies any fever or chills.  She denies any nausea or vomiting although she does report poor appetite and feeling bloated and fatigued. Past Medical History:  Diagnosis Date  . ADD (attention deficit disorder)   . Allergic rhinitis   . Anemia   . Anxiety   . Arrhythmia   . Arthritis    "hands, neck, right shoulder" (07/28/2015)  . Aseptic necrosis ([place])   . Asthma dxc'd 05/2015  . Basal cell cancer   . Bleeding stomach ulcer ~ 06/2005  . COPD (chronic obstructive pulmonary disease) ([place])   . Depression   . Diverticula of colon   . Dyspnea   . Esophagitis   . Gastric ulcer with hemorrhage   . GERD (gastroesophageal reflux disease)   . Heart murmur   . History of blood transfusion 06/2005   "related to pneumonia"  . History of kidney stones   . Hyperlipidemia   . Hypertension   . Hypothyroidism   . Kidney stones   . Lung cancer ([place])    Non small cell lung cancer, adenocarcinoma with BAC 09/2009, VATS converted to wedge resection of left upper lobe mass  . Mitral valve prolapse    "no ORs" (07/28/2015)  . Osteopenia   . Pneumonia 1970's; 06/2005  . Sleep apnea dx'd 06/2015   on oxygen at nite at 2L/[place]   . Squamous cell skin cancer    Past Surgical History:  Procedure  Laterality Date  . ABDOMINAL HYSTERECTOMY  1988  . COLONOSCOPY WITH PROPOFOL N/A 06/15/2017   Procedure: COLONOSCOPY WITH PROPOFOL;  Surgeon: Carol Ada, MD;  Location: [facility];  Service: Endoscopy;  Laterality: N/A;  . HAMMER TOE SURGERY Right ~ 2010  . SALPINGOOPHORECTOMY Left 1988  . SKIN CANCER EXCISION     "I've had severl cut off RLE; left foreqrm, nose under nose"  . THORACOTOMY Left 06/2005  . THORACOTOMY/LOBECTOMY  09/2009; 11/2009   left; right   Current Outpatient Medications on File Prior to Visit  Medication Sig Dispense Refill  . acetaminophen (TYLENOL) 325 MG tablet Take by mouth.    Marland Kitchen albuterol (PROAIR HFA) 108 (90 Base) MCG/ACT inhaler Inhale 1 puff into the lungs every 6 (six) hours as needed for wheezing or shortness of breath.    Marland Kitchen albuterol (PROVENTIL) (2.5 MG/3ML) 0.083% nebulizer solution USE 1 VIAL (3 ML OR 2.5 MG TOTAL) VIA NEBULIZER EVERY 4 HOURS AS NEEDED FOR WHEEZING OR SHORTNESS OF BREATH 450 mL 3  . ALPRAZolam (XANAX) 0.5 MG tablet TAKE 1 TABLET BY MOUTH EVERY DAY AT BEDTIME AS NEEDED FOR ANXIETY OR INSOMNIA 90 tablet 0  . amphetamine-dextroamphetamine (ADDERALL XR)  15 MG 24 hr capsule Take 1 capsule by mouth every morning. 30 capsule 0  . b complex-C-folic acid 1 MG capsule Take 1 capsule by mouth daily.      Marland Kitchen buPROPion (WELLBUTRIN XL) 150 MG 24 hr tablet TAKE 1 TABLET DAILY 90 tablet 3  . Calcium Carbonate-Vit D-Min (CALCIUM 1200 PO) Take by mouth.    . Cyanocobalamin 1000 MCG CAPS Take by mouth.    . doxazosin (CARDURA) 4 MG tablet Take 1 tablet (4 mg total) by mouth daily. 90 tablet 4  . DULoxetine (CYMBALTA) 60 MG capsule TAKE 1 CAPSULE DAILY 90 capsule 3  . fluticasone (FLONASE) 50 MCG/ACT nasal spray Place 2 sprays into both nostrils daily. 48 g 4  . folic acid (FOLVITE) 1 MG tablet Take by mouth.    . levothyroxine (SYNTHROID) 112 MCG tablet Take 1 tablet (112 mcg total) by mouth daily. 90 tablet 3  . losartan-hydrochlorothiazide (HYZAAR) 100-25  MG tablet Take 1 tablet by mouth daily. 90 tablet 4  . mometasone (ELOCON) 0.1 % cream Apply 1 application topically daily. 45 g 0  . Omega-3 1000 MG CAPS     . omeprazole (PRILOSEC) 20 MG capsule Take 1 capsule (20 mg total) by mouth 2 (two) times daily before a meal. 90 capsule 3  . OXYGEN 2lpm with sleep    . pravastatin (PRAVACHOL) 40 MG tablet TAKE 1 TABLET DAILY (STOP SIMVASTATIN) 90 tablet 4  . predniSONE (DELTASONE) 10 MG tablet Take 40mg  x 3 days,20mg  x 3 days, 10mg  x 3 days 21 tablet 0  . TRELEGY ELLIPTA 100-62.5-25 MCG/INH AEPB     . triamcinolone (KENALOG) 0.1 % paste Use as directed 1 application in the mouth or throat 2 (two) times daily. 5 g 12  . VITAMIN D, CHOLECALCIFEROL, PO Take 1 tablet by mouth daily.     No current facility-administered medications on file prior to visit.   Allergies  Allergen Reactions  . Doxycycline Nausea Only  . Erythromycin Nausea Only  . Sulfonamide Derivatives Hives   Social History   Socioeconomic History  . Marital status: Married    Spouse name: Not on file  . Number of children: 3  . Years of education: Not on file  . Highest education level: Not on file  Occupational History  . Occupation: retired Optician, dispensing: RETIRED  Tobacco Use  . Smoking status: Former Smoker    Packs/day: 0.75    Years: 30.00    Pack years: 22.50    Types: Cigarettes    Quit date: 09/11/2004    Years since quitting: 14.9  . Smokeless tobacco: Never Used  Substance and Sexual Activity  . Alcohol use: Yes    Alcohol/week: 7.0 standard drinks    Types: 7 Shots of liquor per week    Comment: 1-2 drinks nightly   . Drug use: No  . Sexual activity: Yes  Other Topics Concern  . Not on file  Social History Narrative  . Not on file   Social Determinants of Health   Financial Resource Strain:   . Difficulty of Paying Living Expenses: Not on file  Food Insecurity:   . Worried About Charity fundraiser in the Last Year: Not on file  . Ran Out of  Food in the Last Year: Not on file  Transportation Needs:   . Lack of Transportation (Medical): Not on file  . Lack of Transportation (Non-Medical): Not on file  Physical Activity:   .  Days of Exercise per Week: Not on file  . Minutes of Exercise per Session: Not on file  Stress:   . Feeling of Stress : Not on file  Social Connections:   . Frequency of Communication with Friends and Family: Not on file  . Frequency of Social Gatherings with Friends and Family: Not on file  . Attends Religious Services: Not on file  . Active Member of Clubs or Organizations: Not on file  . Attends Archivist Meetings: Not on file  . Marital Status: Not on file  Intimate Partner Violence:   . Fear of Current or Ex-Partner: Not on file  . Emotionally Abused: Not on file  . Physically Abused: Not on file  . Sexually Abused: Not on file      Review of Systems  All other systems reviewed and are negative.      Objective:   Physical Exam        Assessment & Plan:  Lower abdominal pain  Patient has not had a bowel movement in 4 to 5 days and reports feeling bloated however she states that her abdomen is soft and is only minimal tender to palpation all throughout.  Her description does not sound like an acute abdomen.  Differential diagnosis includes bloating secondary to constipation versus ileus versus early diverticulitis.  I suspect constipation.  I recommended that she drink a bottle of magnesium citrate.  If she has a bowel movement and her abdominal bloating and discomfort improves, I have recommended that she start taking MiraLAX once daily.  However if abdominal pain worsens I would be concerned about diverticulitis and recommend start taking Cipro and Flagyl as prescribed.  Also gave the patient symptoms of a bowel obstruction.  If she develops an acute abdomen with nausea and vomiting she is directed to go immediately to the emergency room or if the pain worsens despite taking  Cipro and Flagyl she is to go immediately to the emergency room.  Patient is comfortable with the plan.

## 2019-09-09 ENCOUNTER — Other Ambulatory Visit: Payer: Self-pay

## 2019-09-09 NOTE — Telephone Encounter (Signed)
Requested Prescriptions   Pending Prescriptions Disp Refills  . ALPRAZolam (XANAX) 0.5 MG tablet 90 tablet 0    Sig: TAKE 1 TABLET BY MOUTH EVERY DAY AT BEDTIME AS NEEDED FOR ANXIETY OR INSOMNIA     Last OV 09/02/2019   Last written 07/01/2019

## 2019-09-11 MED ORDER — ALPRAZOLAM 0.5 MG PO TABS: ORAL_TABLET | ORAL | 0 refills | Status: DC

## 2019-09-13 ENCOUNTER — Encounter: Payer: Self-pay | Admitting: Family Medicine

## 2019-09-28 ENCOUNTER — Encounter: Payer: Self-pay | Admitting: Family Medicine

## 2019-09-29 MED ORDER — AMPHETAMINE-DEXTROAMPHET ER 15 MG PO CP24: 15.0000 mg | ORAL_CAPSULE | ORAL | 0 refills | Status: DC

## 2019-09-29 NOTE — Telephone Encounter (Signed)
Pt requesting refill on Adderall      LOV: 09/02/19  LRF:   08/28/19   OK to set up for mail order also?

## 2019-09-29 NOTE — Telephone Encounter (Signed)
Is it ok to do mail order on this or does she need to get it from local pharmacy?

## 2019-09-30 ENCOUNTER — Other Ambulatory Visit: Payer: Self-pay | Admitting: Family Medicine

## 2019-09-30 MED ORDER — AMPHETAMINE-DEXTROAMPHET ER 15 MG PO CP24: 15.0000 mg | ORAL_CAPSULE | ORAL | 0 refills | Status: DC

## 2019-09-30 NOTE — Telephone Encounter (Signed)
[name] with mail order

## 2019-11-04 ENCOUNTER — Inpatient Hospital Stay: Payer: Medicare Other | Attending: Internal Medicine

## 2019-11-04 ENCOUNTER — Other Ambulatory Visit: Payer: Self-pay

## 2019-11-04 ENCOUNTER — Ambulatory Visit (HOSPITAL_COMMUNITY)
Admission: RE | Admit: 2019-11-04 | Discharge: 2019-11-04 | Disposition: A | Discharge status: Home / Self Care | Payer: Medicare Other | Source: Ambulatory Visit | Attending: Internal Medicine | Admitting: Internal Medicine

## 2019-11-04 DIAGNOSIS — Z902 Acquired absence of lung [part of]: Secondary | ICD-10-CM | POA: Insufficient documentation

## 2019-11-04 DIAGNOSIS — I2510 Atherosclerotic heart disease of native coronary artery without angina pectoris: Secondary | ICD-10-CM | POA: Diagnosis not present

## 2019-11-04 DIAGNOSIS — Z87442 Personal history of urinary calculi: Secondary | ICD-10-CM | POA: Diagnosis not present

## 2019-11-04 DIAGNOSIS — K449 Diaphragmatic hernia without obstruction or gangrene: Secondary | ICD-10-CM | POA: Diagnosis not present

## 2019-11-04 DIAGNOSIS — M1990 Unspecified osteoarthritis, unspecified site: Secondary | ICD-10-CM | POA: Insufficient documentation

## 2019-11-04 DIAGNOSIS — Z90721 Acquired absence of ovaries, unilateral: Secondary | ICD-10-CM | POA: Insufficient documentation

## 2019-11-04 DIAGNOSIS — Z79899 Other long term (current) drug therapy: Secondary | ICD-10-CM | POA: Insufficient documentation

## 2019-11-04 DIAGNOSIS — C3431 Malignant neoplasm of lower lobe, right bronchus or lung: Secondary | ICD-10-CM | POA: Insufficient documentation

## 2019-11-04 DIAGNOSIS — I700 Atherosclerosis of aorta: Secondary | ICD-10-CM | POA: Insufficient documentation

## 2019-11-04 DIAGNOSIS — C3490 Malignant neoplasm of unspecified part of unspecified bronchus or lung: Secondary | ICD-10-CM | POA: Insufficient documentation

## 2019-11-04 DIAGNOSIS — M8580 Other specified disorders of bone density and structure, unspecified site: Secondary | ICD-10-CM | POA: Insufficient documentation

## 2019-11-04 DIAGNOSIS — K5730 Diverticulosis of large intestine without perforation or abscess without bleeding: Secondary | ICD-10-CM | POA: Insufficient documentation

## 2019-11-04 DIAGNOSIS — Z882 Allergy status to sulfonamides status: Secondary | ICD-10-CM | POA: Diagnosis not present

## 2019-11-04 DIAGNOSIS — Z881 Allergy status to other antibiotic agents status: Secondary | ICD-10-CM | POA: Diagnosis not present

## 2019-11-04 DIAGNOSIS — K760 Fatty (change of) liver, not elsewhere classified: Secondary | ICD-10-CM | POA: Insufficient documentation

## 2019-11-04 DIAGNOSIS — J449 Chronic obstructive pulmonary disease, unspecified: Secondary | ICD-10-CM | POA: Diagnosis not present

## 2019-11-04 DIAGNOSIS — E039 Hypothyroidism, unspecified: Secondary | ICD-10-CM | POA: Diagnosis not present

## 2019-11-04 DIAGNOSIS — I10 Essential (primary) hypertension: Secondary | ICD-10-CM | POA: Insufficient documentation

## 2019-11-04 LAB — CBC WITH DIFFERENTIAL (CANCER CENTER ONLY)
Abs Immature Granulocytes: 0.02 10*3/uL (ref 0.00–0.07)
Basophils Absolute: 0.1 10*3/uL (ref 0.0–0.1)
Basophils Relative: 1 %
Eosinophils Absolute: 0.3 10*3/uL (ref 0.0–0.5)
Eosinophils Relative: 4 %
HCT: 41.2 % (ref 36.0–46.0)
Hemoglobin: 13.9 g/dL (ref 12.0–15.0)
Immature Granulocytes: 0 %
Lymphocytes Relative: 24 %
Lymphs Abs: 1.7 10*3/uL (ref 0.7–4.0)
MCH: 34.2 pg — ABNORMAL HIGH (ref 26.0–34.0)
MCHC: 33.7 g/dL (ref 30.0–36.0)
MCV: 101.2 fL — ABNORMAL HIGH (ref 80.0–100.0)
Monocytes Absolute: 0.8 10*3/uL (ref 0.1–1.0)
Monocytes Relative: 11 %
Neutro Abs: 4.2 10*3/uL (ref 1.7–7.7)
Neutrophils Relative %: 60 %
Platelet Count: 209 10*3/uL (ref 150–400)
RBC: 4.07 MIL/uL (ref 3.87–5.11)
RDW: 12.7 % (ref 11.5–15.5)
WBC Count: 7 10*3/uL (ref 4.0–10.5)
nRBC: 0 % (ref 0.0–0.2)

## 2019-11-04 LAB — CMP (CANCER CENTER ONLY)
ALT: 19 U/L (ref 0–44)
AST: 24 U/L (ref 15–41)
Albumin: 3.9 g/dL (ref 3.5–5.0)
Alkaline Phosphatase: 73 U/L (ref 38–126)
Anion gap: 10 (ref 5–15)
BUN: 15 mg/dL (ref 8–23)
CO2: 27 mmol/L (ref 22–32)
Calcium: 8.9 mg/dL (ref 8.9–10.3)
Chloride: 102 mmol/L (ref 98–111)
Creatinine: 0.74 mg/dL (ref 0.44–1.00)
GFR, Est AFR Am: >60 mL/min (ref >60)
GFR, Estimated: >60 mL/min (ref >60)
Glucose, Bld: 107 mg/dL — ABNORMAL HIGH (ref 70–99)
Potassium: 3.7 mmol/L (ref 3.5–5.1)
Sodium: 139 mmol/L (ref 135–145)
Total Bilirubin: 0.4 mg/dL (ref 0.3–1.2)
Total Protein: 6.7 g/dL (ref 6.5–8.1)

## 2019-11-04 MED ORDER — SODIUM CHLORIDE (PF) 0.9 % IJ SOLN
INTRAMUSCULAR | Status: AC
  Filled 2019-11-04: qty 50

## 2019-11-04 MED ORDER — IOHEXOL 300 MG/ML  SOLN
100.0000 mL | Freq: Once | INTRAMUSCULAR | Status: AC | PRN
  Administered 2019-11-04: 12:00:00 100 mL via INTRAVENOUS

## 2019-11-06 ENCOUNTER — Inpatient Hospital Stay (HOSPITAL_BASED_OUTPATIENT_CLINIC_OR_DEPARTMENT_OTHER): Payer: Medicare Other | Admitting: Internal Medicine

## 2019-11-06 ENCOUNTER — Encounter: Payer: Self-pay | Admitting: Internal Medicine

## 2019-11-06 ENCOUNTER — Other Ambulatory Visit: Payer: Self-pay

## 2019-11-06 VITALS — BP 126/66 | HR 80 | Temp 97.8°F | Resp 18 | Ht 65.0 in | Wt 178.9 lb

## 2019-11-06 DIAGNOSIS — C3490 Malignant neoplasm of unspecified part of unspecified bronchus or lung: Secondary | ICD-10-CM | POA: Diagnosis not present

## 2019-11-06 DIAGNOSIS — R918 Other nonspecific abnormal finding of lung field: Secondary | ICD-10-CM

## 2019-11-06 DIAGNOSIS — I700 Atherosclerosis of aorta: Secondary | ICD-10-CM | POA: Diagnosis not present

## 2019-11-06 DIAGNOSIS — I10 Essential (primary) hypertension: Secondary | ICD-10-CM | POA: Diagnosis not present

## 2019-11-06 DIAGNOSIS — C3431 Malignant neoplasm of lower lobe, right bronchus or lung: Secondary | ICD-10-CM | POA: Diagnosis not present

## 2019-11-06 DIAGNOSIS — I2510 Atherosclerotic heart disease of native coronary artery without angina pectoris: Secondary | ICD-10-CM | POA: Diagnosis not present

## 2019-11-06 DIAGNOSIS — E039 Hypothyroidism, unspecified: Secondary | ICD-10-CM | POA: Diagnosis not present

## 2019-11-06 DIAGNOSIS — J449 Chronic obstructive pulmonary disease, unspecified: Secondary | ICD-10-CM | POA: Diagnosis not present

## 2019-11-06 NOTE — Progress Notes (Signed)
[place] [phone]   Fax:([phone]) Daviston, Warren T, MD [address] [place] [address]  DIAGNOSIS: history of a bilateral stage Ia non-small cell lung cancer involving the left upper lobe as well as right upper lobe  PRIOR THERAPY: 1)  status post wedge resection of the 2 lesions in 2011 under the care of Dr. Arlyce Dice.  2) systemic chemotherapy with carboplatin, Alimta and Avastin for 6 cycles at [facility] completed in 2011. 3) stereotactic radiotherapy to the enlarging left upper lobe lung nodule in 2014.  CURRENT THERAPY: Observation.  INTERVAL HISTORY: [name] 79 y.o. female returns to the clinic today for 6 months follow-up visit.  The patient is feeling fine today with no concerning complaints except for mild aching pain in the left neck and shoulder area.  She denied having any current chest pain, shortness of breath, cough or hemoptysis.  She denied having any fever or chills.  She has no nausea, vomiting, diarrhea or constipation.  She has no headache or visual changes.  She is here today for evaluation with repeat CT scan of the chest, abdomen pelvis for restaging of her disease.  MEDICAL HISTORY: Past Medical History:  Diagnosis Date  . ADD (attention deficit disorder)   . Allergic rhinitis   . Anemia   . Anxiety   . Arrhythmia   . Arthritis    "hands, neck, right shoulder" (07/28/2015)  . Aseptic necrosis ([place])   . Asthma dxc'd 05/2015  . Basal cell cancer   . Bleeding stomach ulcer ~ 06/2005  . COPD (chronic obstructive pulmonary disease) ([place])   . Depression   . Diverticula of colon   . Dyspnea   . Esophagitis   . Gastric ulcer with hemorrhage   . GERD (gastroesophageal reflux disease)   . Heart murmur   . History of blood transfusion 06/2005   "related to pneumonia"  . History of kidney stones   . Hyperlipidemia   . Hypertension   . Hypothyroidism   .  Kidney stones   . Lung cancer ([place])    Non small cell lung cancer, adenocarcinoma with BAC 09/2009, VATS converted to wedge resection of left upper lobe mass  . Mitral valve prolapse    "no ORs" (07/28/2015)  . Osteopenia   . Pneumonia 1970's; 06/2005  . Sleep apnea dx'd 06/2015   on oxygen at nite at 2L/[place]   . Squamous cell skin cancer     ALLERGIES:  is allergic to doxycycline; erythromycin; and sulfonamide derivatives.  MEDICATIONS:  Current Outpatient Medications  Medication Sig Dispense Refill  . acetaminophen (TYLENOL) 325 MG tablet Take by mouth.    Marland Kitchen albuterol (PROAIR HFA) 108 (90 Base) MCG/ACT inhaler Inhale 1 puff into the lungs every 6 (six) hours as needed for wheezing or shortness of breath.    Marland Kitchen albuterol (PROVENTIL) (2.5 MG/3ML) 0.083% nebulizer solution USE 1 VIAL (3 ML OR 2.5 MG TOTAL) VIA NEBULIZER EVERY 4 HOURS AS NEEDED FOR WHEEZING OR SHORTNESS OF BREATH 450 mL 3  . ALPRAZolam (XANAX) 0.5 MG tablet TAKE 1 TABLET BY MOUTH EVERY DAY AT BEDTIME AS NEEDED FOR ANXIETY OR INSOMNIA 90 tablet 0  . amphetamine-dextroamphetamine (ADDERALL XR) 15 MG 24 hr capsule Take 1 capsule by mouth every morning. 90 capsule 0  . b complex-C-folic acid 1 MG capsule Take 1 capsule by mouth daily.      Marland Kitchen  buPROPion (WELLBUTRIN XL) 150 MG 24 hr tablet TAKE 1 TABLET DAILY 90 tablet 3  . Calcium Carbonate-Vit D-Min (CALCIUM 1200 PO) Take by mouth.    . ciprofloxacin (CIPRO) 500 MG tablet Take 1 tablet (500 mg total) by mouth 2 (two) times daily. 20 tablet 0  . Cyanocobalamin 1000 MCG CAPS Take by mouth.    . doxazosin (CARDURA) 4 MG tablet Take 1 tablet (4 mg total) by mouth daily. 90 tablet 4  . DULoxetine (CYMBALTA) 60 MG capsule TAKE 1 CAPSULE DAILY 90 capsule 3  . fluticasone (FLONASE) 50 MCG/ACT nasal spray Place 2 sprays into both nostrils daily. 48 g 4  . folic acid (FOLVITE) 1 MG tablet Take by mouth.    . levothyroxine (SYNTHROID) 112 MCG tablet Take 1 tablet (112 mcg total) by mouth  daily. 90 tablet 3  . losartan-hydrochlorothiazide (HYZAAR) 100-25 MG tablet Take 1 tablet by mouth daily. 90 tablet 4  . metroNIDAZOLE (FLAGYL) 500 MG tablet Take 1 tablet (500 mg total) by mouth 2 (two) times daily. 20 tablet 0  . mometasone (ELOCON) 0.1 % cream Apply 1 application topically daily. 45 g 0  . Omega-3 1000 MG CAPS     . omeprazole (PRILOSEC) 20 MG capsule Take 1 capsule (20 mg total) by mouth 2 (two) times daily before a meal. 90 capsule 3  . OXYGEN 2lpm with sleep    . pravastatin (PRAVACHOL) 40 MG tablet TAKE 1 TABLET DAILY (STOP SIMVASTATIN) 90 tablet 4  . predniSONE (DELTASONE) 10 MG tablet Take 40mg  x 3 days,20mg  x 3 days, 10mg  x 3 days 21 tablet 0  . TRELEGY ELLIPTA 100-62.5-25 MCG/INH AEPB     . triamcinolone (KENALOG) 0.1 % paste Use as directed 1 application in the mouth or throat 2 (two) times daily. 5 g 12  . VITAMIN D, CHOLECALCIFEROL, PO Take 1 tablet by mouth daily.     No current facility-administered medications for this visit.    SURGICAL HISTORY:  Past Surgical History:  Procedure Laterality Date  . ABDOMINAL HYSTERECTOMY  1988  . COLONOSCOPY WITH PROPOFOL N/A 06/15/2017   Procedure: COLONOSCOPY WITH PROPOFOL;  Surgeon: Carol Ada, MD;  Location: [facility];  Service: Endoscopy;  Laterality: N/A;  . HAMMER TOE SURGERY Right ~ 2010  . SALPINGOOPHORECTOMY Left 1988  . SKIN CANCER EXCISION     "I've had severl cut off RLE; left foreqrm, nose under nose"  . THORACOTOMY Left 06/2005  . THORACOTOMY/LOBECTOMY  09/2009; 11/2009   left; right    REVIEW OF SYSTEMS:  A comprehensive review of systems was negative except for: Musculoskeletal: positive for arthralgias   PHYSICAL EXAMINATION: General appearance: alert, cooperative and no distress Head: Normocephalic, without obvious abnormality, atraumatic Neck: no adenopathy, no JVD, supple, symmetrical, trachea midline and thyroid not enlarged, symmetric, no tenderness/mass/nodules Lymph nodes: Cervical,  supraclavicular, and axillary nodes normal. Resp: clear to auscultation bilaterally Back: symmetric, no curvature. ROM normal. No CVA tenderness. Cardio: regular rate and rhythm, S1, S2 normal, no murmur, click, rub or gallop GI: soft, non-tender; bowel sounds normal; no masses,  no organomegaly Extremities: extremities normal, atraumatic, no cyanosis or edema  ECOG PERFORMANCE STATUS: 1 - Symptomatic but completely ambulatory  Blood pressure 126/66, pulse 80, temperature 97.8 F (36.6 C), temperature source Oral, resp. rate 18, height 5\' 5"  (1.651 m), weight 178 lb 14.4 oz (81.1 kg), SpO2 97 %.  LABORATORY DATA: Lab Results  Component Value Date   WBC 7.0 11/04/2019   HGB 13.9 11/04/2019  HCT 41.2 11/04/2019   MCV 101.2 (H) 11/04/2019   PLT 209 11/04/2019      Chemistry      Component Value Date/Time   NA 139 11/04/2019 1018   K 3.7 11/04/2019 1018   CL 102 11/04/2019 1018   CO2 27 11/04/2019 1018   BUN 15 11/04/2019 1018   CREATININE 0.74 11/04/2019 1018   CREATININE 0.83 01/28/2019 1008      Component Value Date/Time   CALCIUM 8.9 11/04/2019 1018   ALKPHOS 73 11/04/2019 1018   AST 24 11/04/2019 1018   ALT 19 11/04/2019 1018   BILITOT 0.4 11/04/2019 1018       RADIOGRAPHIC STUDIES: CT Chest W Contrast  Result Date: 11/04/2019 CLINICAL DATA:  Non-small cell lung cancer, restaging EXAM: CT CHEST, ABDOMEN, AND PELVIS WITH CONTRAST TECHNIQUE: Multidetector CT imaging of the chest, abdomen and pelvis was performed following the standard protocol during bolus administration of intravenous contrast. CONTRAST:  167mL OMNIPAQUE IOHEXOL 300 MG/ML SOLN, additional oral enteric contrast COMPARISON:  05/13/2019 FINDINGS: CT CHEST FINDINGS Cardiovascular: Aortic atherosclerosis. Normal heart size. Scattered coronary artery calcifications. No pericardial effusion. Mediastinum/Nodes: No enlarged mediastinal, hilar, or axillary lymph nodes. Small hiatal hernia. Thyroid gland, trachea,  and esophagus demonstrate no significant findings. Lungs/Pleura: Status post right upper lobectomy. Unchanged post treatment and postoperative appearance of a spiculated mass of the peripheral left upper lobe measuring 3.8 x 2.5 cm with adjacent surgical suture (series 7, image 74). There are numerous additional ground-glass pulmonary opacities and mixed solid and ground-glass nodules, the largest mixed nodule of the right lower lobe measuring a total of 1.1 cm with a solid component measuring 5 mm, unchanged compared to prior examination (series 7, image 92). There is a background of innumerable tiny centrilobular pulmonary nodules throughout the lungs. No pleural effusion or pneumothorax. Musculoskeletal: No chest wall mass or suspicious bone lesions identified. Multiple nonacute bilateral rib fractures. CT ABDOMEN PELVIS FINDINGS Hepatobiliary: No solid liver abnormality is seen. Hepatic steatosis. No gallstones, gallbladder wall thickening, or biliary dilatation. Pancreas: Unremarkable. No pancreatic ductal dilatation or surrounding inflammatory changes. Spleen: Normal in size without significant abnormality. Adrenals/Urinary Tract: Adrenal glands are unremarkable. Kidneys are normal, without renal calculi, solid lesion, or hydronephrosis. Bladder is unremarkable. Stomach/Bowel: Stomach is within normal limits. Appendix appears normal. No evidence of bowel wall thickening, distention, or inflammatory changes. Sigmoid diverticulosis. Vascular/Lymphatic: Aortic atherosclerosis. No enlarged abdominal or pelvic lymph nodes. Reproductive: No mass or other abnormality. Other: No abdominal wall hernia or abnormality. No abdominopelvic ascites. Musculoskeletal: No acute or significant osseous findings. IMPRESSION: 1. Stable post treatment and postoperative appearance of a spiculated mass of the peripheral left upper lobe with adjacent surgical suture. 2. Status post right upper lobectomy. 3. Numerous additional  ground-glass and mixed solid and ground-glass nodules throughout the lungs, the largest of which in the right lower lobe measures 1.1 cm with a solid component measuring 5 mm, unchanged compared to prior examination. These are generally nonspecific, the majority likely infectious or inflammatory, although the largest nodules, particularly those with discernible solid components, are concerning for multifocal adenocarcinoma. Attention on follow-up. 4. Background of innumerable tiny centrilobular pulmonary nodules throughout the lungs, nonspecific, likely smoking-related respiratory bronchiolitis. 5. No evidence of metastatic disease within the abdomen or pelvis. 6. Hepatic steatosis. 7. Coronary artery disease. Aortic Atherosclerosis (ICD10-I70.0). Electronically Signed   By: Eddie Candle M.D.   On: 11/04/2019 16:48   CT Abdomen Pelvis W Contrast  Result Date: 11/04/2019 CLINICAL DATA:  Non-small cell  lung cancer, restaging EXAM: CT CHEST, ABDOMEN, AND PELVIS WITH CONTRAST TECHNIQUE: Multidetector CT imaging of the chest, abdomen and pelvis was performed following the standard protocol during bolus administration of intravenous contrast. CONTRAST:  12mL OMNIPAQUE IOHEXOL 300 MG/ML SOLN, additional oral enteric contrast COMPARISON:  05/13/2019 FINDINGS: CT CHEST FINDINGS Cardiovascular: Aortic atherosclerosis. Normal heart size. Scattered coronary artery calcifications. No pericardial effusion. Mediastinum/Nodes: No enlarged mediastinal, hilar, or axillary lymph nodes. Small hiatal hernia. Thyroid gland, trachea, and esophagus demonstrate no significant findings. Lungs/Pleura: Status post right upper lobectomy. Unchanged post treatment and postoperative appearance of a spiculated mass of the peripheral left upper lobe measuring 3.8 x 2.5 cm with adjacent surgical suture (series 7, image 74). There are numerous additional ground-glass pulmonary opacities and mixed solid and ground-glass nodules, the largest mixed  nodule of the right lower lobe measuring a total of 1.1 cm with a solid component measuring 5 mm, unchanged compared to prior examination (series 7, image 92). There is a background of innumerable tiny centrilobular pulmonary nodules throughout the lungs. No pleural effusion or pneumothorax. Musculoskeletal: No chest wall mass or suspicious bone lesions identified. Multiple nonacute bilateral rib fractures. CT ABDOMEN PELVIS FINDINGS Hepatobiliary: No solid liver abnormality is seen. Hepatic steatosis. No gallstones, gallbladder wall thickening, or biliary dilatation. Pancreas: Unremarkable. No pancreatic ductal dilatation or surrounding inflammatory changes. Spleen: Normal in size without significant abnormality. Adrenals/Urinary Tract: Adrenal glands are unremarkable. Kidneys are normal, without renal calculi, solid lesion, or hydronephrosis. Bladder is unremarkable. Stomach/Bowel: Stomach is within normal limits. Appendix appears normal. No evidence of bowel wall thickening, distention, or inflammatory changes. Sigmoid diverticulosis. Vascular/Lymphatic: Aortic atherosclerosis. No enlarged abdominal or pelvic lymph nodes. Reproductive: No mass or other abnormality. Other: No abdominal wall hernia or abnormality. No abdominopelvic ascites. Musculoskeletal: No acute or significant osseous findings. IMPRESSION: 1. Stable post treatment and postoperative appearance of a spiculated mass of the peripheral left upper lobe with adjacent surgical suture. 2. Status post right upper lobectomy. 3. Numerous additional ground-glass and mixed solid and ground-glass nodules throughout the lungs, the largest of which in the right lower lobe measures 1.1 cm with a solid component measuring 5 mm, unchanged compared to prior examination. These are generally nonspecific, the majority likely infectious or inflammatory, although the largest nodules, particularly those with discernible solid components, are concerning for multifocal  adenocarcinoma. Attention on follow-up. 4. Background of innumerable tiny centrilobular pulmonary nodules throughout the lungs, nonspecific, likely smoking-related respiratory bronchiolitis. 5. No evidence of metastatic disease within the abdomen or pelvis. 6. Hepatic steatosis. 7. Coronary artery disease. Aortic Atherosclerosis (ICD10-I70.0). Electronically Signed   By: Eddie Candle M.D.   On: 11/04/2019 16:48    ASSESSMENT AND PLAN: This is a very pleasant 79 years old white female with history of bilateral stage Ia non-small cell lung cancer involving the left upper lobe as well as the right upper lobe status post wedge resection of the 2 lesions in 2011 under the care of Dr. Arlyce Dice.  The patient also received adjuvant systemic chemotherapy for 6 cycles at [facility] with carboplatin, Alimta and Avastin.  She also received stereotactic radiotherapy to the enlarging left upper lobe lung nodule in 2014.  She has been on observation since that time and she is feeling fine. The patient had repeat CT scan of the chest, abdomen pelvis performed recently.  I personally and independently reviewed the scan images and discussed the results and showed the images to the patient today. Her scan showed no concerning findings  for disease progression. I recommended for the patient to continue on observation with repeat CT scan of the chest in 6 months. She was advised to call immediately if she has any concerning symptoms in the interval. The patient voices understanding of current disease status and treatment options and is in agreement with the current care plan.  All questions were answered. The patient knows to call the clinic with any problems, questions or concerns. We can certainly see the patient much sooner if necessary.   Disclaimer: This note was dictated with voice recognition software. Similar sounding words can inadvertently be transcribed and may not be corrected upon review.

## 2019-11-07 ENCOUNTER — Telehealth: Payer: Self-pay | Admitting: Internal Medicine

## 2019-11-07 NOTE — Telephone Encounter (Signed)
Scheduled per los. Called and left msg. Mailed printout  °

## 2019-11-10 ENCOUNTER — Other Ambulatory Visit: Payer: Self-pay

## 2019-11-10 ENCOUNTER — Encounter: Payer: Self-pay | Admitting: Family Medicine

## 2019-11-10 ENCOUNTER — Ambulatory Visit (INDEPENDENT_AMBULATORY_CARE_PROVIDER_SITE_OTHER): Payer: Medicare Other | Admitting: Family Medicine

## 2019-11-10 VITALS — BP 100/58 | HR 88 | Temp 96.9°F | Resp 20 | Ht 65.0 in | Wt 180.0 lb

## 2019-11-10 DIAGNOSIS — S46812A Strain of other muscles, fascia and tendons at shoulder and upper arm level, left arm, initial encounter: Secondary | ICD-10-CM

## 2019-11-10 DIAGNOSIS — B028 Zoster with other complications: Secondary | ICD-10-CM | POA: Diagnosis not present

## 2019-11-10 MED ORDER — HYDROCODONE-ACETAMINOPHEN 10-325 MG PO TABS: 1.0000 | ORAL_TABLET | Freq: Three times a day (TID) | ORAL | 0 refills | Status: DC | PRN

## 2019-11-10 MED ORDER — VALACYCLOVIR HCL 1 G PO TABS: 1000.0000 mg | ORAL_TABLET | Freq: Three times a day (TID) | ORAL | 0 refills | Status: DC

## 2019-11-10 NOTE — Progress Notes (Signed)
Subjective:    Patient ID: [name], female    DOB: 08/19/1941, 79 y.o.   MRN: [id number]  HPI Patient presents today with 2 problems.  First she reports pain in the left side of her neck.  She reports spasms and stiffness in the muscles in the left side of her neck.  Began less than 2 weeks ago.  She denies any numbness or tingling radiating down her left arm.  She has negative empty can sign.  She has no pain with abduction greater than 90 degrees.  She has a negative Hawking sign and a negative Spurling sign.  However she does have tenderness to palpation in the left trapezius muscle and in the left cervical paraspinal muscles.  Pain radiates down the left side of her neck and along the body of the trapezius into the left shoulder.  It aches and throbs at night although she has no decreased range of motion in her left shoulder.  She does have worsening of the pain if she turns her chin to the left suggesting strain in the trapezius muscle.  Second problem is pain in her right rib area.  She believes that she has pulled a muscle in her ribs.  However when I lifted her shirt and evaluated her skin she has a severe outbreak of shingles.  She has numerous erythematous base vesicles in a dermatomal pattern clustered together originating in the middle of her back roughly at the level of T12 and radiating around through her right mid axillary line all the way to just below her right breast.  Based off of the midline.  The skin is extremely painful to the touch and she is in moderate to severe pain in that area. Past Medical History:  Diagnosis Date  . ADD (attention deficit disorder)   . Allergic rhinitis   . Anemia   . Anxiety   . Arrhythmia   . Arthritis    "hands, neck, right shoulder" (07/28/2015)  . Aseptic necrosis ([place])   . Asthma dxc'd 05/2015  . Basal cell cancer   . Bleeding stomach ulcer ~ 06/2005  . COPD (chronic obstructive pulmonary disease) ([place])   . Depression   .  Diverticula of colon   . Dyspnea   . Esophagitis   . Gastric ulcer with hemorrhage   . GERD (gastroesophageal reflux disease)   . Heart murmur   . History of blood transfusion 06/2005   "related to pneumonia"  . History of kidney stones   . Hyperlipidemia   . Hypertension   . Hypothyroidism   . Kidney stones   . Lung cancer ([place])    Non small cell lung cancer, adenocarcinoma with BAC 09/2009, VATS converted to wedge resection of left upper lobe mass  . Mitral valve prolapse    "no ORs" (07/28/2015)  . Osteopenia   . Pneumonia 1970's; 06/2005  . Sleep apnea dx'd 06/2015   on oxygen at nite at 2L/[place]   . Squamous cell skin cancer    Past Surgical History:  Procedure Laterality Date  . ABDOMINAL HYSTERECTOMY  1988  . COLONOSCOPY WITH PROPOFOL N/A 06/15/2017   Procedure: COLONOSCOPY WITH PROPOFOL;  Surgeon: Carol Ada, MD;  Location: [facility];  Service: Endoscopy;  Laterality: N/A;  . HAMMER TOE SURGERY Right ~ 2010  . SALPINGOOPHORECTOMY Left 1988  . SKIN CANCER EXCISION     "I've had severl cut off RLE; left foreqrm, nose under nose"  . THORACOTOMY Left 06/2005  . THORACOTOMY/LOBECTOMY  09/2009; 11/2009   left; right   Current Outpatient Medications on File Prior to Visit  Medication Sig Dispense Refill  . acetaminophen (TYLENOL) 325 MG tablet Take by mouth.    Marland Kitchen albuterol (PROAIR HFA) 108 (90 Base) MCG/ACT inhaler Inhale 1 puff into the lungs every 6 (six) hours as needed for wheezing or shortness of breath.    Marland Kitchen albuterol (PROVENTIL) (2.5 MG/3ML) 0.083% nebulizer solution USE 1 VIAL (3 ML OR 2.5 MG TOTAL) VIA NEBULIZER EVERY 4 HOURS AS NEEDED FOR WHEEZING OR SHORTNESS OF BREATH 450 mL 3  . ALPRAZolam (XANAX) 0.5 MG tablet TAKE 1 TABLET BY MOUTH EVERY DAY AT BEDTIME AS NEEDED FOR ANXIETY OR INSOMNIA 90 tablet 0  . amphetamine-dextroamphetamine (ADDERALL XR) 15 MG 24 hr capsule Take 1 capsule by mouth every morning. 90 capsule 0  . b complex-C-folic acid 1 MG capsule Take 1  capsule by mouth daily.      Marland Kitchen buPROPion (WELLBUTRIN XL) 150 MG 24 hr tablet TAKE 1 TABLET DAILY 90 tablet 3  . Calcium Carbonate-Vit D-Min (CALCIUM 1200 PO) Take by mouth.    . Cyanocobalamin 1000 MCG CAPS Take by mouth.    . doxazosin (CARDURA) 4 MG tablet Take 1 tablet (4 mg total) by mouth daily. 90 tablet 4  . DULoxetine (CYMBALTA) 60 MG capsule TAKE 1 CAPSULE DAILY 90 capsule 3  . fluticasone (FLONASE) 50 MCG/ACT nasal spray Place 2 sprays into both nostrils daily. 48 g 4  . folic acid (FOLVITE) 1 MG tablet Take by mouth.    . levothyroxine (SYNTHROID) 112 MCG tablet Take 1 tablet (112 mcg total) by mouth daily. 90 tablet 3  . losartan-hydrochlorothiazide (HYZAAR) 100-25 MG tablet Take 1 tablet by mouth daily. 90 tablet 4  . mometasone (ELOCON) 0.1 % cream Apply 1 application topically daily. 45 g 0  . Multiple Vitamins-Minerals (EMERGEN-C FIVE PO) Take by mouth.    . Omega-3 1000 MG CAPS     . omeprazole (PRILOSEC) 20 MG capsule Take 1 capsule (20 mg total) by mouth 2 (two) times daily before a meal. 90 capsule 3  . OXYGEN 2lpm with sleep    . pravastatin (PRAVACHOL) 40 MG tablet TAKE 1 TABLET DAILY (STOP SIMVASTATIN) 90 tablet 4  . TRELEGY ELLIPTA 100-62.5-25 MCG/INH AEPB     . triamcinolone (KENALOG) 0.1 % paste Use as directed 1 application in the mouth or throat 2 (two) times daily. 5 g 12  . VITAMIN D, CHOLECALCIFEROL, PO Take 1 tablet by mouth daily.     No current facility-administered medications on file prior to visit.   Allergies  Allergen Reactions  . Doxycycline Nausea Only  . Erythromycin Nausea Only  . Sulfonamide Derivatives Hives   Social History   Socioeconomic History  . Marital status: Married    Spouse name: Not on file  . Number of children: 3  . Years of education: Not on file  . Highest education level: Not on file  Occupational History  . Occupation: retired Optician, dispensing: RETIRED  Tobacco Use  . Smoking status: Former Smoker    Packs/day:  0.75    Years: 30.00    Pack years: 22.50    Types: Cigarettes    Quit date: 09/11/2004    Years since quitting: 15.1  . Smokeless tobacco: Never Used  Substance and Sexual Activity  . Alcohol use: Yes    Alcohol/week: 7.0 standard drinks    Types: 7 Shots of liquor per week  Comment: 1-2 drinks nightly   . Drug use: No  . Sexual activity: Yes  Other Topics Concern  . Not on file  Social History Narrative  . Not on file   Social Determinants of Health   Financial Resource Strain:   . Difficulty of Paying Living Expenses: Not on file  Food Insecurity:   . Worried About Charity fundraiser in the Last Year: Not on file  . Ran Out of Food in the Last Year: Not on file  Transportation Needs:   . Lack of Transportation (Medical): Not on file  . Lack of Transportation (Non-Medical): Not on file  Physical Activity:   . Days of Exercise per Week: Not on file  . Minutes of Exercise per Session: Not on file  Stress:   . Feeling of Stress : Not on file  Social Connections:   . Frequency of Communication with Friends and Family: Not on file  . Frequency of Social Gatherings with Friends and Family: Not on file  . Attends Religious Services: Not on file  . Active Member of Clubs or Organizations: Not on file  . Attends Archivist Meetings: Not on file  . Marital Status: Not on file  Intimate Partner Violence:   . Fear of Current or Ex-Partner: Not on file  . Emotionally Abused: Not on file  . Physically Abused: Not on file  . Sexually Abused: Not on file      Review of Systems  All other systems reviewed and are negative.      Objective:   Physical Exam Vitals reviewed.  Constitutional:      General: She is not in acute distress.    Appearance: Normal appearance. She is normal weight. She is not ill-appearing or toxic-appearing.  Cardiovascular:     Rate and Rhythm: Normal rate and regular rhythm.  Pulmonary:     Effort: Pulmonary effort is normal.      Breath sounds: Normal breath sounds.    Chest:    Musculoskeletal:     Cervical back: Spasms and tenderness present. No bony tenderness. Normal range of motion.       Back:  Neurological:     Mental Status: She is alert.           Assessment & Plan:  Herpes zoster with complication  Trapezius strain, left, initial encounter  Begin Valtrex 1 g p.o. 3 times daily for 7 days.  Use Norco 10/325 1 p.o. every 6 hours as needed pain.  Reassess in 1 week if no better or sooner if worse.

## 2019-11-17 ENCOUNTER — Other Ambulatory Visit: Payer: Self-pay | Admitting: *Deleted

## 2019-11-17 ENCOUNTER — Other Ambulatory Visit: Payer: Self-pay | Admitting: Family Medicine

## 2019-11-17 MED ORDER — HYDROCODONE-ACETAMINOPHEN 10-325 MG PO TABS: 1.0000 | ORAL_TABLET | Freq: Three times a day (TID) | ORAL | 0 refills | Status: DC | PRN

## 2019-11-17 NOTE — Telephone Encounter (Signed)
Received call from patient.   Reports that she continues to have severe pain from shingles.   Requested refill on Hydrocodone. Ok to refill? Last prescription was sent on 11/10/2019 #30 tabs.

## 2019-11-18 ENCOUNTER — Encounter: Payer: Self-pay | Admitting: Family Medicine

## 2019-11-19 ENCOUNTER — Encounter: Payer: Self-pay | Admitting: Family Medicine

## 2019-11-21 ENCOUNTER — Encounter: Payer: Self-pay | Admitting: Family Medicine

## 2019-11-21 MED ORDER — ALPRAZOLAM 0.5 MG PO TABS: ORAL_TABLET | ORAL | 0 refills | Status: DC

## 2019-11-21 NOTE — Telephone Encounter (Signed)
Ok to refill??  Last office visit 11/19/2019.  Last refill 09/11/2019.

## 2019-11-23 ENCOUNTER — Encounter: Payer: Self-pay | Admitting: Family Medicine

## 2019-11-23 DIAGNOSIS — L989 Disorder of the skin and subcutaneous tissue, unspecified: Secondary | ICD-10-CM

## 2019-11-28 ENCOUNTER — Encounter: Payer: Self-pay | Admitting: Family Medicine

## 2019-11-28 MED ORDER — HYDROCODONE-ACETAMINOPHEN 10-325 MG PO TABS: 1.0000 | ORAL_TABLET | Freq: Three times a day (TID) | ORAL | 0 refills | Status: AC | PRN

## 2019-11-28 NOTE — Telephone Encounter (Signed)
Ok to refill?  Patient was given Hydrocodone/APAP 10/325 #30 tabs on 11/17/2019.

## 2019-12-02 ENCOUNTER — Other Ambulatory Visit: Payer: Self-pay | Admitting: Family Medicine

## 2019-12-30 ENCOUNTER — Other Ambulatory Visit: Payer: Self-pay

## 2019-12-30 ENCOUNTER — Ambulatory Visit (INDEPENDENT_AMBULATORY_CARE_PROVIDER_SITE_OTHER): Payer: Medicare Other | Admitting: Family Medicine

## 2019-12-30 ENCOUNTER — Encounter: Payer: Self-pay | Admitting: Family Medicine

## 2019-12-30 VITALS — BP 146/72 | HR 88 | Temp 97.7°F | Resp 18 | Ht 65.0 in | Wt 174.0 lb

## 2019-12-30 DIAGNOSIS — B0229 Other postherpetic nervous system involvement: Secondary | ICD-10-CM | POA: Diagnosis not present

## 2019-12-30 MED ORDER — ZOSTER VAC RECOMB ADJUVANTED 50 MCG/0.5ML IM SUSR: 0.5000 mL | Freq: Once | INTRAMUSCULAR | 1 refills | Status: AC

## 2019-12-30 NOTE — Addendum Note (Signed)
Addended by: Shary Decamp B on: 12/30/2019 02:01 PM   Modules accepted: Orders

## 2019-12-30 NOTE — Progress Notes (Signed)
Subjective:    Patient ID: [name], female    DOB: 1941-08-03, 79 y.o.   MRN: [id number]  HPI  11/10/19 Patient presents today with 2 problems.  First she reports pain in the left side of her neck.  She reports spasms and stiffness in the muscles in the left side of her neck.  Began less than 2 weeks ago.  She denies any numbness or tingling radiating down her left arm.  She has negative empty can sign.  She has no pain with abduction greater than 90 degrees.  She has a negative Hawking sign and a negative Spurling sign.  However she does have tenderness to palpation in the left trapezius muscle and in the left cervical paraspinal muscles.  Pain radiates down the left side of her neck and along the body of the trapezius into the left shoulder.  It aches and throbs at night although she has no decreased range of motion in her left shoulder.  She does have worsening of the pain if she turns her chin to the left suggesting strain in the trapezius muscle.  Second problem is pain in her right rib area.  She believes that she has pulled a muscle in her ribs.  However when I lifted her shirt and evaluated her skin she has a severe outbreak of shingles.  She has numerous erythematous base vesicles in a dermatomal pattern clustered together originating in the middle of her back roughly at the level of T12 and radiating around through her right mid axillary line all the way to just below her right breast.  Based off of the midline.  The skin is extremely painful to the touch and she is in moderate to severe pain in that area.  At that time, my plan was: Begin Valtrex 1 g p.o. 3 times daily for 7 days.  Use Norco 10/325 1 p.o. every 6 hours as needed pain.  Reassess in 1 week if no better or sooner if worse.  12/30/19 Patient is here today for recheck.  The rash subsided several weeks ago however she has continued to have severe neuropathic pain in the distribution previously affected by the shingles rash.   She reports a "rainbow fire" that radiated from the center of her back around to the front of her chest.  She was using some of her husband's pain medication in addition to Tylenol to help treat the pain.  She also occasionally took some of her husbands gabapentin which did seem to help the pain.  However she also reports memory loss and dizziness on the medication.  Around the same time her son unfortunately passed away.  Therefore she started taking Xanax at night to help her sleep.  I believe some of the memory issues and balance issues could be due to polypharmacy coupling the pain medication from her husband, the gabapentin from her husband and the Xanax that I prescribed over the loss of her son.  She has now stopped the Xanax.  I have recommended that she stop the pain medication and gabapentin.  She states that the pain is much better and she believes that she can manage it with Tylenol only. Past Medical History:  Diagnosis Date  . ADD (attention deficit disorder)   . Allergic rhinitis   . Anemia   . Anxiety   . Arrhythmia   . Arthritis    "hands, neck, right shoulder" (07/28/2015)  . Aseptic necrosis ([place])   . Asthma dxc'd 05/2015  .  Basal cell cancer   . Bleeding stomach ulcer ~ 06/2005  . COPD (chronic obstructive pulmonary disease) ([place])   . Depression   . Diverticula of colon   . Dyspnea   . Esophagitis   . Gastric ulcer with hemorrhage   . GERD (gastroesophageal reflux disease)   . Heart murmur   . History of blood transfusion 06/2005   "related to pneumonia"  . History of kidney stones   . Hyperlipidemia   . Hypertension   . Hypothyroidism   . Kidney stones   . Lung cancer ([place])    Non small cell lung cancer, adenocarcinoma with BAC 09/2009, VATS converted to wedge resection of left upper lobe mass  . Mitral valve prolapse    "no ORs" (07/28/2015)  . Osteopenia   . Pneumonia 1970's; 06/2005  . Sleep apnea dx'd 06/2015   on oxygen at nite at 2L/   . Squamous cell  skin cancer    Past Surgical History:  Procedure Laterality Date  . ABDOMINAL HYSTERECTOMY  1988  . COLONOSCOPY WITH PROPOFOL N/A 06/15/2017   Procedure: COLONOSCOPY WITH PROPOFOL;  Surgeon: Carol Ada, MD;  Location: [facility];  Service: Endoscopy;  Laterality: N/A;  . HAMMER TOE SURGERY Right ~ 2010  . SALPINGOOPHORECTOMY Left 1988  . SKIN CANCER EXCISION     "I've had severl cut off RLE; left foreqrm, nose under nose"  . THORACOTOMY Left 06/2005  . THORACOTOMY/LOBECTOMY  09/2009; 11/2009   left; right   Current Outpatient Medications on File Prior to Visit  Medication Sig Dispense Refill  . acetaminophen (TYLENOL) 325 MG tablet Take by mouth.    Marland Kitchen albuterol (PROAIR HFA) 108 (90 Base) MCG/ACT inhaler Inhale 1 puff into the lungs every 6 (six) hours as needed for wheezing or shortness of breath.    Marland Kitchen albuterol (PROVENTIL) (2.5 MG/3ML) 0.083% nebulizer solution USE 1 VIAL (3 ML OR 2.5 MG TOTAL) VIA NEBULIZER EVERY 4 HOURS AS NEEDED FOR WHEEZING OR SHORTNESS OF BREATH 450 mL 3  . amphetamine-dextroamphetamine (ADDERALL XR) 15 MG 24 hr capsule Take 1 capsule by mouth every morning. 90 capsule 0  . b complex-C-folic acid 1 MG capsule Take 1 capsule by mouth daily.      Marland Kitchen buPROPion (WELLBUTRIN XL) 150 MG 24 hr tablet TAKE 1 TABLET DAILY 90 tablet 3  . Calcium Carbonate-Vit D-Min (CALCIUM 1200 PO) Take by mouth.    . Cyanocobalamin 1000 MCG CAPS Take by mouth.    . doxazosin (CARDURA) 4 MG tablet Take 1 tablet (4 mg total) by mouth daily. 90 tablet 4  . DULoxetine (CYMBALTA) 60 MG capsule TAKE 1 CAPSULE DAILY 90 capsule 3  . fluticasone (FLONASE) 50 MCG/ACT nasal spray Place 2 sprays into both nostrils daily. 48 g 4  . folic acid (FOLVITE) 1 MG tablet Take by mouth.    . levothyroxine (SYNTHROID) 112 MCG tablet Take 1 tablet (112 mcg total) by mouth daily. 90 tablet 3  . losartan-hydrochlorothiazide (HYZAAR) 100-25 MG tablet Take 1 tablet by mouth daily. 90 tablet 4  . mometasone  (ELOCON) 0.1 % cream Apply 1 application topically daily. 45 g 0  . Multiple Vitamins-Minerals (EMERGEN-C FIVE PO) Take by mouth.    . Omega-3 1000 MG CAPS     . omeprazole (PRILOSEC) 20 MG capsule Take 1 capsule (20 mg total) by mouth 2 (two) times daily before a meal. 90 capsule 3  . OXYGEN 2lpm with sleep    . pravastatin (PRAVACHOL) 40 MG tablet  TAKE 1 TABLET DAILY (STOP SIMVASTATIN) 90 tablet 4  . TRELEGY ELLIPTA 100-62.5-25 MCG/INH AEPB USE 1 INHALATION DAILY (DISCONTINUE BEVESPI) 180 each 3  . triamcinolone (KENALOG) 0.1 % paste Use as directed 1 application in the mouth or throat 2 (two) times daily. 5 g 12  . valACYclovir (VALTREX) 1000 MG tablet Take 1 tablet (1,000 mg total) by mouth 3 (three) times daily. 21 tablet 0  . VITAMIN D, CHOLECALCIFEROL, PO Take 1 tablet by mouth daily.     No current facility-administered medications on file prior to visit.   Allergies  Allergen Reactions  . Doxycycline Nausea Only  . Erythromycin Nausea Only  . Sulfonamide Derivatives Hives   Social History   Socioeconomic History  . Marital status: Married    Spouse name: Not on file  . Number of children: 3  . Years of education: Not on file  . Highest education level: Not on file  Occupational History  . Occupation: retired Optician, dispensing: RETIRED  Tobacco Use  . Smoking status: Former Smoker    Packs/day: 0.75    Years: 30.00    Pack years: 22.50    Types: Cigarettes    Quit date: 09/11/2004    Years since quitting: 15.3  . Smokeless tobacco: Never Used  Substance and Sexual Activity  . Alcohol use: Yes    Alcohol/week: 7.0 standard drinks    Types: 7 Shots of liquor per week    Comment: 1-2 drinks nightly   . Drug use: No  . Sexual activity: Yes  Other Topics Concern  . Not on file  Social History Narrative  . Not on file   Social Determinants of Health   Financial Resource Strain:   . Difficulty of Paying Living Expenses:   Food Insecurity:   . Worried About  Charity fundraiser in the Last Year:   . Arboriculturist in the Last Year:   Transportation Needs:   . Film/video editor (Medical):   Marland Kitchen Lack of Transportation (Non-Medical):   Physical Activity:   . Days of Exercise per Week:   . Minutes of Exercise per Session:   Stress:   . Feeling of Stress :   Social Connections:   . Frequency of Communication with Friends and Family:   . Frequency of Social Gatherings with Friends and Family:   . Attends Religious Services:   . Active Member of Clubs or Organizations:   . Attends Archivist Meetings:   Marland Kitchen Marital Status:   Intimate Partner Violence:   . Fear of Current or Ex-Partner:   . Emotionally Abused:   Marland Kitchen Physically Abused:   . Sexually Abused:       Review of Systems  All other systems reviewed and are negative.      Objective:   Physical Exam Vitals reviewed.  Constitutional:      General: She is not in acute distress.    Appearance: Normal appearance. She is normal weight. She is not ill-appearing or toxic-appearing.  Cardiovascular:     Rate and Rhythm: Normal rate and regular rhythm.  Pulmonary:     Effort: Pulmonary effort is normal.     Breath sounds: Normal breath sounds.  Neurological:     Mental Status: She is alert.           Assessment & Plan:  Postherpetic neuralgia  Patient's pain is gradually starting to improve.  Discontinue hydrocodone.  Discontinue gabapentin.  Encouraged the patient  not to use other peoples medication without discussing it with me first.  Discontinue Xanax.  I believe some of her confusion and memory issues lately may be due to polypharmacy.  Recommended Tylenol alone for the pain due to shingles since the pain is much better.

## 2020-01-02 ENCOUNTER — Other Ambulatory Visit: Payer: Self-pay | Admitting: Family Medicine

## 2020-01-02 ENCOUNTER — Telehealth: Payer: Self-pay | Admitting: Family Medicine

## 2020-01-02 NOTE — Telephone Encounter (Signed)
Pt called stating that she or her husband needs a refill on Hydrocodone. Pt states she was in the office early this week 04/20 and that she is still in pain. Pt has taken the last of her husband's hydrocodone and would like to get a refill. Pt's husband got hydrocodone filled on 12/11/2019. Please advise.

## 2020-01-07 ENCOUNTER — Other Ambulatory Visit: Payer: Self-pay | Admitting: Family Medicine

## 2020-01-12 ENCOUNTER — Other Ambulatory Visit: Payer: Self-pay | Admitting: Family Medicine

## 2020-01-15 ENCOUNTER — Other Ambulatory Visit: Payer: Self-pay | Admitting: Family Medicine

## 2020-01-15 DIAGNOSIS — Z1231 Encounter for screening mammogram for malignant neoplasm of breast: Secondary | ICD-10-CM

## 2020-01-19 ENCOUNTER — Telehealth: Payer: Self-pay | Admitting: Family Medicine

## 2020-01-19 NOTE — Telephone Encounter (Signed)
Call placed to patient to inquire.   Reports that she is having issues holding her bladder and thinks she may have UTI.   Advised that OV would be needed.   Appointment scheduled.

## 2020-01-19 NOTE — Telephone Encounter (Signed)
[phone] Need refill Cipro

## 2020-01-20 ENCOUNTER — Encounter: Payer: Self-pay | Admitting: Family Medicine

## 2020-01-20 ENCOUNTER — Ambulatory Visit (INDEPENDENT_AMBULATORY_CARE_PROVIDER_SITE_OTHER): Payer: Medicare Other | Admitting: Family Medicine

## 2020-01-20 ENCOUNTER — Other Ambulatory Visit: Payer: Self-pay

## 2020-01-20 VITALS — BP 132/78 | HR 104 | Temp 96.3°F | Resp 20 | Ht 65.0 in | Wt 172.0 lb

## 2020-01-20 DIAGNOSIS — K921 Melena: Secondary | ICD-10-CM

## 2020-01-20 DIAGNOSIS — R350 Frequency of micturition: Secondary | ICD-10-CM

## 2020-01-20 LAB — URINALYSIS, ROUTINE W REFLEX MICROSCOPIC
Bilirubin Urine: NEGATIVE
Glucose, UA: NEGATIVE
Hgb urine dipstick: NEGATIVE
Ketones, ur: NEGATIVE
Leukocytes,Ua: NEGATIVE
Nitrite: NEGATIVE
Protein, ur: NEGATIVE
Specific Gravity, Urine: 1.02 (ref 1.001–1.03)
pH: 5.5 (ref 5.0–8.0)

## 2020-01-20 NOTE — Progress Notes (Signed)
Subjective:    Patient ID: [name], female    DOB: [birth year], 79 y.o.   MRN: [id number]  HPI  Patient states that she has a urinary tract infection.  She states that she went to sit in her car yesterday and she lost control of her bladder and had urinary incontinence when she sat down.  She states that later that afternoon she felt like she had to go urinate frequently.  However so far today she has had no other symptoms.  She denies any dysuria.  She denies any urgency today.  She denies any frequency today.  She denies any hematuria.  Urinalysis is negative for blood, nitrites, leukocyte esterase, or glucose.  Of note, during our discussion, the patient does report black stool last week.  She associates this to her stopping her omeprazole.  She has resumed her omeprazole and the black stool has improved.  Past Medical History:  Diagnosis Date  . ADD (attention deficit disorder)   . Allergic rhinitis   . Anemia   . Anxiety   . Arrhythmia   . Arthritis    "hands, neck, right shoulder" (07/28/2015)  . Aseptic necrosis ([place])   . Asthma dxc'd 05/2015  . Basal cell cancer   . Bleeding stomach ulcer ~ 06/2005  . COPD (chronic obstructive pulmonary disease) ([place])   . Depression   . Diverticula of colon   . Dyspnea   . Esophagitis   . Gastric ulcer with hemorrhage   . GERD (gastroesophageal reflux disease)   . Heart murmur   . History of blood transfusion 06/2005   "related to pneumonia"  . History of kidney stones   . Hyperlipidemia   . Hypertension   . Hypothyroidism   . Kidney stones   . Lung cancer ([place])    Non small cell lung cancer, adenocarcinoma with BAC 09/2009, VATS converted to wedge resection of left upper lobe mass  . Mitral valve prolapse    "no ORs" (07/28/2015)  . Osteopenia   . Pneumonia 1970's; 06/2005  . Sleep apnea dx'd 06/2015   on oxygen at nite at 2L/[place]   . Squamous cell skin cancer    Past Surgical History:  Procedure Laterality Date  .  ABDOMINAL HYSTERECTOMY  1988  . COLONOSCOPY WITH PROPOFOL N/A 06/15/2017   Procedure: COLONOSCOPY WITH PROPOFOL;  Surgeon: Carol Ada, MD;  Location: [facility];  Service: Endoscopy;  Laterality: N/A;  . HAMMER TOE SURGERY Right ~ 2010  . SALPINGOOPHORECTOMY Left 1988  . SKIN CANCER EXCISION     "I've had severl cut off RLE; left foreqrm, nose under nose"  . THORACOTOMY Left 06/2005  . THORACOTOMY/LOBECTOMY  09/2009; 11/2009   left; right   Current Outpatient Medications on File Prior to Visit  Medication Sig Dispense Refill  . acetaminophen (TYLENOL) 325 MG tablet Take by mouth.    Marland Kitchen albuterol (PROAIR HFA) 108 (90 Base) MCG/ACT inhaler Inhale 1 puff into the lungs every 6 (six) hours as needed for wheezing or shortness of breath.    Marland Kitchen albuterol (PROVENTIL) (2.5 MG/3ML) 0.083% nebulizer solution USE 1 VIAL (3 ML OR 2.5 MG TOTAL) VIA NEBULIZER EVERY 4 HOURS AS NEEDED FOR WHEEZING OR SHORTNESS OF BREATH 450 mL 3  . amphetamine-dextroamphetamine (ADDERALL XR) 15 MG 24 hr capsule Take 1 capsule by mouth every morning. 90 capsule 0  . b complex-C-folic acid 1 MG capsule Take 1 capsule by mouth daily.      Marland Kitchen buPROPion (WELLBUTRIN XL) 150  MG 24 hr tablet TAKE 1 TABLET DAILY 90 tablet 0  . Calcium Carbonate-Vit D-Min (CALCIUM 1200 PO) Take by mouth.    . Cyanocobalamin 1000 MCG CAPS Take by mouth.    . doxazosin (CARDURA) 4 MG tablet Take 1 tablet (4 mg total) by mouth daily. 90 tablet 4  . DULoxetine (CYMBALTA) 60 MG capsule TAKE 1 CAPSULE DAILY 90 capsule 3  . fluticasone (FLONASE) 50 MCG/ACT nasal spray USE 2 SPRAYS IN EACH NOSTRIL DAILY 48 g 3  . folic acid (FOLVITE) 1 MG tablet Take by mouth.    . LEVOXYL 112 MCG tablet TAKE 1 TABLET DAILY (DISCONTINUE LEVOTHYROXINE 100) 90 tablet 3  . losartan-hydrochlorothiazide (HYZAAR) 100-25 MG tablet Take 1 tablet by mouth daily. 90 tablet 4  . mometasone (ELOCON) 0.1 % cream Apply 1 application topically daily. 45 g 0  . Multiple Vitamins-Minerals  (EMERGEN-C FIVE PO) Take by mouth.    . Omega-3 1000 MG CAPS     . omeprazole (PRILOSEC) 20 MG capsule Take 1 capsule (20 mg total) by mouth 2 (two) times daily before a meal. 90 capsule 3  . OXYGEN 2lpm with sleep    . pravastatin (PRAVACHOL) 40 MG tablet TAKE 1 TABLET DAILY (STOP SIMVASTATIN) 90 tablet 4  . TRELEGY ELLIPTA 100-62.5-25 MCG/INH AEPB USE 1 INHALATION DAILY (DISCONTINUE BEVESPI) 180 each 3  . triamcinolone (KENALOG) 0.1 % paste Use as directed 1 application in the mouth or throat 2 (two) times daily. 5 g 12  . valACYclovir (VALTREX) 1000 MG tablet Take 1 tablet (1,000 mg total) by mouth 3 (three) times daily. 21 tablet 0  . VITAMIN D, CHOLECALCIFEROL, PO Take 1 tablet by mouth daily.     No current facility-administered medications on file prior to visit.   Allergies  Allergen Reactions  . Doxycycline Nausea Only  . Erythromycin Nausea Only  . Sulfonamide Derivatives Hives   Social History   Socioeconomic History  . Marital status: Married    Spouse name: Not on file  . Number of children: 3  . Years of education: Not on file  . Highest education level: Not on file  Occupational History  . Occupation: retired Optician, dispensing: RETIRED  Tobacco Use  . Smoking status: Former Smoker    Packs/day: 0.75    Years: 30.00    Pack years: 22.50    Types: Cigarettes    Quit date: 09/11/2004    Years since quitting: 15.3  . Smokeless tobacco: Never Used  Substance and Sexual Activity  . Alcohol use: Yes    Alcohol/week: 7.0 standard drinks    Types: 7 Shots of liquor per week    Comment: 1-2 drinks nightly   . Drug use: No  . Sexual activity: Yes  Other Topics Concern  . Not on file  Social History Narrative  . Not on file   Social Determinants of Health   Financial Resource Strain:   . Difficulty of Paying Living Expenses:   Food Insecurity:   . Worried About Charity fundraiser in the Last Year:   . Arboriculturist in the Last Year:   Transportation Needs:    . Film/video editor (Medical):   Marland Kitchen Lack of Transportation (Non-Medical):   Physical Activity:   . Days of Exercise per Week:   . Minutes of Exercise per Session:   Stress:   . Feeling of Stress :   Social Connections:   . Frequency of Communication with  Friends and Family:   . Frequency of Social Gatherings with Friends and Family:   . Attends Religious Services:   . Active Member of Clubs or Organizations:   . Attends Archivist Meetings:   Marland Kitchen Marital Status:   Intimate Partner Violence:   . Fear of Current or Ex-Partner:   . Emotionally Abused:   Marland Kitchen Physically Abused:   . Sexually Abused:       Review of Systems  All other systems reviewed and are negative.      Objective:   Physical Exam Vitals reviewed.  Constitutional:      General: She is not in acute distress.    Appearance: Normal appearance. She is normal weight. She is not ill-appearing or toxic-appearing.  Cardiovascular:     Rate and Rhythm: Normal rate and regular rhythm.  Pulmonary:     Effort: Pulmonary effort is normal.     Breath sounds: Normal breath sounds.  Neurological:     Mental Status: She is alert.           Assessment & Plan:  Frequent urination - Plan: Urinalysis, Routine w reflex microscopic  Melena - Plan: Fecal Globin By Immunochemistry, CBC with Differential/Platelet, COMPLETE METABOLIC PANEL WITH GFR  There is no evidence of urinary tract infection.  I will check a BMP to rule out evidence of hyperglycemia.  I will also check a CBC to evaluate for anemia given the melena and check her stool for blood.

## 2020-01-21 LAB — COMPLETE METABOLIC PANEL WITH GFR
AG Ratio: 1.8 (calc) (ref 1.0–2.5)
ALT: 12 U/L (ref 6–29)
AST: 15 U/L (ref 10–35)
Albumin: 4.4 g/dL (ref 3.6–5.1)
Alkaline phosphatase (APISO): 72 U/L (ref 37–153)
BUN/Creatinine Ratio: 21 (calc) (ref 6–22)
BUN: 21 mg/dL (ref 7–25)
CO2: 28 mmol/L (ref 20–32)
Calcium: 10.2 mg/dL (ref 8.6–10.4)
Chloride: 98 mmol/L (ref 98–110)
Creat: 1 mg/dL — ABNORMAL HIGH (ref 0.60–0.93)
GFR, Est African American: 63 mL/min/{1.73_m2} (ref >=60)
GFR, Est Non African American: 54 mL/min/{1.73_m2} — ABNORMAL LOW (ref >=60)
Globulin: 2.4 g/dL (calc) (ref 1.9–3.7)
Glucose, Bld: 95 mg/dL (ref 65–99)
Potassium: 3.4 mmol/L — ABNORMAL LOW (ref 3.5–5.3)
Sodium: 137 mmol/L (ref 135–146)
Total Bilirubin: 0.4 mg/dL (ref 0.2–1.2)
Total Protein: 6.8 g/dL (ref 6.1–8.1)

## 2020-01-21 LAB — CBC WITH DIFFERENTIAL/PLATELET
Absolute Monocytes: 1229 cells/uL — ABNORMAL HIGH (ref 200–950)
Basophils Absolute: 59 cells/uL (ref 0–200)
Basophils Relative: 0.5 %
Eosinophils Absolute: 246 cells/uL (ref 15–500)
Eosinophils Relative: 2.1 %
HCT: 45.1 % — ABNORMAL HIGH (ref 35.0–45.0)
Hemoglobin: 15.2 g/dL (ref 11.7–15.5)
Lymphs Abs: 2586 cells/uL (ref 850–3900)
MCH: 34.6 pg — ABNORMAL HIGH (ref 27.0–33.0)
MCHC: 33.7 g/dL (ref 32.0–36.0)
MCV: 102.7 fL — ABNORMAL HIGH (ref 80.0–100.0)
MPV: 11 fL (ref 7.5–12.5)
Monocytes Relative: 10.5 %
Neutro Abs: 7582 cells/uL (ref 1500–7800)
Neutrophils Relative %: 64.8 %
Platelets: 290 10*3/uL (ref 140–400)
RBC: 4.39 10*6/uL (ref 3.80–5.10)
RDW: 12.9 % (ref 11.0–15.0)
Total Lymphocyte: 22.1 %
WBC: 11.7 10*3/uL — ABNORMAL HIGH (ref 3.8–10.8)

## 2020-01-25 ENCOUNTER — Other Ambulatory Visit: Payer: Self-pay | Admitting: Family Medicine

## 2020-02-02 ENCOUNTER — Other Ambulatory Visit: Payer: Self-pay | Admitting: Family Medicine

## 2020-02-12 ENCOUNTER — Other Ambulatory Visit: Payer: Self-pay | Admitting: Family Medicine

## 2020-02-13 ENCOUNTER — Encounter: Payer: Self-pay | Admitting: Family Medicine

## 2020-02-13 MED ORDER — AMPHETAMINE-DEXTROAMPHET ER 15 MG PO CP24: 15.0000 mg | ORAL_CAPSULE | ORAL | 0 refills | Status: DC

## 2020-02-13 NOTE — Telephone Encounter (Signed)
Ok to refill??  Last office visit 01/20/2020.  Last refill 09/30/2019.

## 2020-03-01 ENCOUNTER — Other Ambulatory Visit: Payer: Self-pay

## 2020-03-01 ENCOUNTER — Ambulatory Visit
Admission: RE | Admit: 2020-03-01 | Discharge: 2020-03-01 | Disposition: A | Discharge status: Home / Self Care | Payer: Medicare Other | Source: Ambulatory Visit | Attending: Family Medicine | Admitting: Family Medicine

## 2020-03-01 DIAGNOSIS — Z1231 Encounter for screening mammogram for malignant neoplasm of breast: Secondary | ICD-10-CM

## 2020-03-25 ENCOUNTER — Other Ambulatory Visit: Payer: Self-pay | Admitting: Family Medicine

## 2020-03-29 ENCOUNTER — Other Ambulatory Visit: Payer: Self-pay | Admitting: Family Medicine

## 2020-03-29 DIAGNOSIS — E7800 Pure hypercholesterolemia, unspecified: Secondary | ICD-10-CM

## 2020-04-01 ENCOUNTER — Other Ambulatory Visit: Payer: Self-pay | Admitting: Family Medicine

## 2020-04-09 ENCOUNTER — Telehealth: Payer: Self-pay | Admitting: Family Medicine

## 2020-04-09 DIAGNOSIS — H9190 Unspecified hearing loss, unspecified ear: Secondary | ICD-10-CM

## 2020-04-09 NOTE — Telephone Encounter (Signed)
CB# [phone] Need referral [facility]  Test fax to [facility] fax # [phone]

## 2020-04-14 ENCOUNTER — Other Ambulatory Visit: Payer: Self-pay | Admitting: Family Medicine

## 2020-04-14 MED ORDER — OMEPRAZOLE 20 MG PO CPDR: 20.0000 mg | DELAYED_RELEASE_CAPSULE | Freq: Two times a day (BID) | ORAL | 3 refills | Status: DC

## 2020-04-14 NOTE — Telephone Encounter (Signed)
Prescription sent to pharmacy.

## 2020-04-14 NOTE — Telephone Encounter (Signed)
Pt needs refill on omeprazole (PRILOSEC) 20 MG capsule    for 90 day supply sent [place] [place]  [address] [place] [address]  Also since the pt is completley out she needs a 10 to 14 day supply sent to [facility] of [address]    Pt call back number [phone]

## 2020-04-19 NOTE — Telephone Encounter (Signed)
Ok to place referral.

## 2020-04-19 NOTE — Telephone Encounter (Signed)
Referral orders placed

## 2020-04-19 NOTE — Telephone Encounter (Signed)
ok 

## 2020-04-27 ENCOUNTER — Ambulatory Visit: Payer: Medicare Other

## 2020-04-28 ENCOUNTER — Ambulatory Visit: Payer: Medicare Other

## 2020-05-02 ENCOUNTER — Other Ambulatory Visit: Payer: Self-pay | Admitting: Family Medicine

## 2020-05-03 ENCOUNTER — Other Ambulatory Visit: Payer: Self-pay

## 2020-05-03 ENCOUNTER — Inpatient Hospital Stay: Payer: Medicare Other | Attending: Internal Medicine

## 2020-05-03 ENCOUNTER — Ambulatory Visit (HOSPITAL_COMMUNITY)
Admission: RE | Admit: 2020-05-03 | Discharge: 2020-05-03 | Disposition: A | Discharge status: Home / Self Care | Payer: Medicare Other | Source: Ambulatory Visit | Attending: Internal Medicine | Admitting: Internal Medicine

## 2020-05-03 DIAGNOSIS — I2510 Atherosclerotic heart disease of native coronary artery without angina pectoris: Secondary | ICD-10-CM | POA: Diagnosis not present

## 2020-05-03 DIAGNOSIS — Z902 Acquired absence of lung [part of]: Secondary | ICD-10-CM | POA: Insufficient documentation

## 2020-05-03 DIAGNOSIS — C3411 Malignant neoplasm of upper lobe, right bronchus or lung: Secondary | ICD-10-CM | POA: Insufficient documentation

## 2020-05-03 DIAGNOSIS — C3490 Malignant neoplasm of unspecified part of unspecified bronchus or lung: Secondary | ICD-10-CM

## 2020-05-03 DIAGNOSIS — M8580 Other specified disorders of bone density and structure, unspecified site: Secondary | ICD-10-CM | POA: Insufficient documentation

## 2020-05-03 DIAGNOSIS — R0609 Other forms of dyspnea: Secondary | ICD-10-CM | POA: Diagnosis not present

## 2020-05-03 DIAGNOSIS — C3412 Malignant neoplasm of upper lobe, left bronchus or lung: Secondary | ICD-10-CM | POA: Insufficient documentation

## 2020-05-03 DIAGNOSIS — Z79899 Other long term (current) drug therapy: Secondary | ICD-10-CM | POA: Insufficient documentation

## 2020-05-03 DIAGNOSIS — R0602 Shortness of breath: Secondary | ICD-10-CM | POA: Diagnosis not present

## 2020-05-03 DIAGNOSIS — Z882 Allergy status to sulfonamides status: Secondary | ICD-10-CM | POA: Insufficient documentation

## 2020-05-03 DIAGNOSIS — Z881 Allergy status to other antibiotic agents status: Secondary | ICD-10-CM | POA: Diagnosis not present

## 2020-05-03 DIAGNOSIS — Z87442 Personal history of urinary calculi: Secondary | ICD-10-CM | POA: Insufficient documentation

## 2020-05-03 DIAGNOSIS — I700 Atherosclerosis of aorta: Secondary | ICD-10-CM | POA: Insufficient documentation

## 2020-05-03 DIAGNOSIS — Z9221 Personal history of antineoplastic chemotherapy: Secondary | ICD-10-CM | POA: Diagnosis not present

## 2020-05-03 DIAGNOSIS — Z90721 Acquired absence of ovaries, unilateral: Secondary | ICD-10-CM | POA: Insufficient documentation

## 2020-05-03 DIAGNOSIS — K449 Diaphragmatic hernia without obstruction or gangrene: Secondary | ICD-10-CM | POA: Diagnosis not present

## 2020-05-03 DIAGNOSIS — J984 Other disorders of lung: Secondary | ICD-10-CM | POA: Diagnosis not present

## 2020-05-03 LAB — CBC WITH DIFFERENTIAL (CANCER CENTER ONLY)
Abs Immature Granulocytes: 0.04 10*3/uL (ref 0.00–0.07)
Basophils Absolute: 0.1 10*3/uL (ref 0.0–0.1)
Basophils Relative: 1 %
Eosinophils Absolute: 0.3 10*3/uL (ref 0.0–0.5)
Eosinophils Relative: 3 %
HCT: 40.7 % (ref 36.0–46.0)
Hemoglobin: 13.9 g/dL (ref 12.0–15.0)
Immature Granulocytes: 1 %
Lymphocytes Relative: 23 %
Lymphs Abs: 2 10*3/uL (ref 0.7–4.0)
MCH: 34.2 pg — ABNORMAL HIGH (ref 26.0–34.0)
MCHC: 34.2 g/dL (ref 30.0–36.0)
MCV: 100 fL (ref 80.0–100.0)
Monocytes Absolute: 1 10*3/uL (ref 0.1–1.0)
Monocytes Relative: 12 %
Neutro Abs: 5.2 10*3/uL (ref 1.7–7.7)
Neutrophils Relative %: 60 %
Platelet Count: 289 10*3/uL (ref 150–400)
RBC: 4.07 MIL/uL (ref 3.87–5.11)
RDW: 12.5 % (ref 11.5–15.5)
WBC Count: 8.5 10*3/uL (ref 4.0–10.5)
nRBC: 0 % (ref 0.0–0.2)

## 2020-05-03 LAB — CMP (CANCER CENTER ONLY)
ALT: 14 U/L (ref 0–44)
AST: 18 U/L (ref 15–41)
Albumin: 4 g/dL (ref 3.5–5.0)
Alkaline Phosphatase: 65 U/L (ref 38–126)
Anion gap: 8 (ref 5–15)
BUN: 17 mg/dL (ref 8–23)
CO2: 28 mmol/L (ref 22–32)
Calcium: 10 mg/dL (ref 8.9–10.3)
Chloride: 102 mmol/L (ref 98–111)
Creatinine: 0.84 mg/dL (ref 0.44–1.00)
GFR, Est AFR Am: >60 mL/min (ref >60)
GFR, Estimated: >60 mL/min (ref >60)
Glucose, Bld: 96 mg/dL (ref 70–99)
Potassium: 3.8 mmol/L (ref 3.5–5.1)
Sodium: 138 mmol/L (ref 135–145)
Total Bilirubin: 0.5 mg/dL (ref 0.3–1.2)
Total Protein: 7 g/dL (ref 6.5–8.1)

## 2020-05-03 MED ORDER — IOHEXOL 300 MG/ML  SOLN
75.0000 mL | Freq: Once | INTRAMUSCULAR | Status: AC | PRN
  Administered 2020-05-03: 75 mL via INTRAVENOUS

## 2020-05-03 MED ORDER — SODIUM CHLORIDE (PF) 0.9 % IJ SOLN
INTRAMUSCULAR | Status: AC
  Filled 2020-05-03: qty 50

## 2020-05-05 ENCOUNTER — Encounter: Payer: Self-pay | Admitting: Internal Medicine

## 2020-05-05 ENCOUNTER — Other Ambulatory Visit: Payer: Self-pay

## 2020-05-05 ENCOUNTER — Inpatient Hospital Stay (HOSPITAL_BASED_OUTPATIENT_CLINIC_OR_DEPARTMENT_OTHER): Payer: Medicare Other | Admitting: Internal Medicine

## 2020-05-05 DIAGNOSIS — C3411 Malignant neoplasm of upper lobe, right bronchus or lung: Secondary | ICD-10-CM | POA: Diagnosis not present

## 2020-05-05 DIAGNOSIS — C3490 Malignant neoplasm of unspecified part of unspecified bronchus or lung: Secondary | ICD-10-CM | POA: Diagnosis not present

## 2020-05-05 DIAGNOSIS — R0602 Shortness of breath: Secondary | ICD-10-CM | POA: Diagnosis not present

## 2020-05-05 DIAGNOSIS — I700 Atherosclerosis of aorta: Secondary | ICD-10-CM | POA: Diagnosis not present

## 2020-05-05 DIAGNOSIS — R0609 Other forms of dyspnea: Secondary | ICD-10-CM | POA: Diagnosis not present

## 2020-05-05 DIAGNOSIS — M8580 Other specified disorders of bone density and structure, unspecified site: Secondary | ICD-10-CM | POA: Diagnosis not present

## 2020-05-05 DIAGNOSIS — C3412 Malignant neoplasm of upper lobe, left bronchus or lung: Secondary | ICD-10-CM | POA: Diagnosis not present

## 2020-05-05 NOTE — Progress Notes (Signed)
[facility] [phone]   Fax:([phone]) [place], Warren T, MD [address] [place] [address]  DIAGNOSIS: history of a bilateral stage Ia non-small cell lung cancer involving the left upper lobe as well as right upper lobe  PRIOR THERAPY: 1)  status post wedge resection of the 2 lesions in 2011 under the care of Dr. Arlyce Dice.  2) systemic chemotherapy with carboplatin, Alimta and Avastin for 6 cycles at [facility] completed in 2011. 3) stereotactic radiotherapy to the enlarging left upper lobe lung nodule in 2014.  CURRENT THERAPY: Observation.  INTERVAL HISTORY: [name] 79 y.o. female returns to the clinic today for 6 months follow-up visit.  The patient is feeling fine today with no concerning complaints except for occasional shortness of breath.  She denied having any chest pain, cough or hemoptysis.  She denied having any nausea, vomiting, diarrhea or constipation.  She has no headache or visual changes.  She gained few pounds since her last visit.  The patient had repeat CT scan of the chest performed recently and she is here for evaluation and discussion of her scan results.  MEDICAL HISTORY: Past Medical History:  Diagnosis Date  . ADD (attention deficit disorder)   . Allergic rhinitis   . Anemia   . Anxiety   . Arrhythmia   . Arthritis    "hands, neck, right shoulder" (07/28/2015)  . Aseptic necrosis ([place])   . Asthma dxc'd 05/2015  . Basal cell cancer   . Bleeding stomach ulcer ~ 06/2005  . COPD (chronic obstructive pulmonary disease) ([place])   . Depression   . Diverticula of colon   . Dyspnea   . Esophagitis   . Gastric ulcer with hemorrhage   . GERD (gastroesophageal reflux disease)   . Heart murmur   . History of blood transfusion 06/2005   "related to pneumonia"  . History of kidney stones   . Hyperlipidemia   . Hypertension   . Hypothyroidism   . Kidney  stones   . Lung cancer ([place])    Non small cell lung cancer, adenocarcinoma with BAC 09/2009, VATS converted to wedge resection of left upper lobe mass  . Mitral valve prolapse    "no ORs" (07/28/2015)  . Osteopenia   . Pneumonia 1970's; 06/2005  . Sleep apnea dx'd 06/2015   on oxygen at nite at 2L/[place]   . Squamous cell skin cancer     ALLERGIES:  is allergic to doxycycline, erythromycin, and sulfonamide derivatives.  MEDICATIONS:  Current Outpatient Medications  Medication Sig Dispense Refill  . thiamine (VITAMIN B-1) 100 MG tablet Take 100 mg by mouth daily.    Marland Kitchen acetaminophen (TYLENOL) 325 MG tablet Take by mouth.    Marland Kitchen albuterol (PROAIR HFA) 108 (90 Base) MCG/ACT inhaler Inhale 1 puff into the lungs every 6 (six) hours as needed for wheezing or shortness of breath.    Marland Kitchen albuterol (PROVENTIL) (2.5 MG/3ML) 0.083% nebulizer solution USE 1 VIAL (3 ML OR 2.5 MG TOTAL) VIA NEBULIZER EVERY 4 HOURS AS NEEDED FOR WHEEZING OR SHORTNESS OF BREATH 450 mL 3  . amphetamine-dextroamphetamine (ADDERALL XR) 15 MG 24 hr capsule Take 1 capsule by mouth every morning. 90 capsule 0  . buPROPion (WELLBUTRIN XL) 150 MG 24 hr tablet TAKE 1 TABLET DAILY 90 tablet 3  . Calcium Carbonate-Vit D-Min (CALCIUM 1200 PO) Take by mouth.    . Cyanocobalamin 1000  MCG CAPS Take by mouth.    . doxazosin (CARDURA) 4 MG tablet TAKE 1 TABLET DAILY 90 tablet 3  . DULoxetine (CYMBALTA) 60 MG capsule TAKE 1 CAPSULE DAILY 90 capsule 3  . fluticasone (FLONASE) 50 MCG/ACT nasal spray USE 2 SPRAYS IN EACH NOSTRIL DAILY 48 g 3  . folic acid (FOLVITE) 1 MG tablet Take by mouth.    . LEVOXYL 112 MCG tablet TAKE 1 TABLET DAILY (DISCONTINUE LEVOTHYROXINE 100) 90 tablet 3  . losartan-hydrochlorothiazide (HYZAAR) 100-25 MG tablet TAKE 1 TABLET DAILY 90 tablet 3  . mometasone (ELOCON) 0.1 % cream Apply 1 application topically daily. 45 g 0  . Multiple Vitamins-Minerals (EMERGEN-C FIVE PO) Take by mouth.    Marland Kitchen omeprazole (PRILOSEC) 20 MG  capsule Take 1 capsule (20 mg total) by mouth 2 (two) times daily before a meal. 90 capsule 3  . OXYGEN 2lpm with sleep    . pravastatin (PRAVACHOL) 40 MG tablet TAKE 1 TABLET DAILY 90 tablet 3  . traZODone (DESYREL) 50 MG tablet TAKE 1 TABLET AT BEDTIME AS NEEDED FOR SLEEP 90 tablet 3  . TRELEGY ELLIPTA 100-62.5-25 MCG/INH AEPB USE 1 INHALATION DAILY (DISCONTINUE BEVESPI) 180 each 3  . VITAMIN D, CHOLECALCIFEROL, PO Take 1 tablet by mouth daily.     No current facility-administered medications for this visit.    SURGICAL HISTORY:  Past Surgical History:  Procedure Laterality Date  . ABDOMINAL HYSTERECTOMY  1988  . COLONOSCOPY WITH PROPOFOL N/A 06/15/2017   Procedure: COLONOSCOPY WITH PROPOFOL;  Surgeon: Carol Ada, MD;  Location: [facility];  Service: Endoscopy;  Laterality: N/A;  . HAMMER TOE SURGERY Right ~ 2010  . SALPINGOOPHORECTOMY Left 1988  . SKIN CANCER EXCISION     "I've had severl cut off RLE; left foreqrm, nose under nose"  . THORACOTOMY Left 06/2005  . THORACOTOMY/LOBECTOMY  09/2009; 11/2009   left; right    REVIEW OF SYSTEMS:  A comprehensive review of systems was negative except for: Respiratory: positive for dyspnea on exertion   PHYSICAL EXAMINATION: General appearance: alert, cooperative and no distress Head: Normocephalic, without obvious abnormality, atraumatic Neck: no adenopathy, no JVD, supple, symmetrical, trachea midline and thyroid not enlarged, symmetric, no tenderness/mass/nodules Lymph nodes: Cervical, supraclavicular, and axillary nodes normal. Resp: clear to auscultation bilaterally Back: symmetric, no curvature. ROM normal. No CVA tenderness. Cardio: regular rate and rhythm, S1, S2 normal, no murmur, click, rub or gallop GI: soft, non-tender; bowel sounds normal; no masses,  no organomegaly Extremities: extremities normal, atraumatic, no cyanosis or edema  ECOG PERFORMANCE STATUS: 1 - Symptomatic but completely ambulatory  Blood pressure (!)  117/93, pulse 90, temperature 99.4 F (37.4 C), temperature source Tympanic, resp. rate 18, height 5\' 5"  (1.651 m), weight 176 lb (79.8 kg), SpO2 99 %.  LABORATORY DATA: Lab Results  Component Value Date   WBC 8.5 05/03/2020   HGB 13.9 05/03/2020   HCT 40.7 05/03/2020   MCV 100.0 05/03/2020   PLT 289 05/03/2020      Chemistry      Component Value Date/Time   NA 138 05/03/2020 1134   K 3.8 05/03/2020 1134   CL 102 05/03/2020 1134   CO2 28 05/03/2020 1134   BUN 17 05/03/2020 1134   CREATININE 0.84 05/03/2020 1134   CREATININE 1.00 (H) 01/20/2020 1459      Component Value Date/Time   CALCIUM 10.0 05/03/2020 1134   ALKPHOS 65 05/03/2020 1134   AST 18 05/03/2020 1134   ALT 14 05/03/2020 1134  BILITOT 0.5 05/03/2020 1134       RADIOGRAPHIC STUDIES: CT Chest W Contrast  Result Date: 05/03/2020 CLINICAL DATA:  Bilateral non-small cell lung cancer diagnosed in 2011 status post chemotherapy. Restaging. EXAM: CT CHEST WITH CONTRAST TECHNIQUE: Multidetector CT imaging of the chest was performed during intravenous contrast administration. CONTRAST:  57mL OMNIPAQUE IOHEXOL 300 MG/ML  SOLN COMPARISON:  11/04/2019 CT chest, abdomen and pelvis. FINDINGS: Cardiovascular: Normal heart size. No significant pericardial effusion/thickening. Left anterior descending coronary atherosclerosis. Atherosclerotic nonaneurysmal thoracic aorta. Normal caliber pulmonary arteries. No central pulmonary emboli. Mediastinum/Nodes: No discrete thyroid nodules. Unremarkable esophagus. No pathologically enlarged axillary, mediastinal or hilar lymph nodes. Lungs/Pleura: No pneumothorax. No pleural effusion. Status post right upper lobectomy. Persistent sharply marginated masslike consolidation in the left upper lobe adjacent to wedge resection suture line with associated volume loss and distortion, measuring 2.6 x 2.0 cm (series 5/image 53), previously 2.6 x 2.4 cm, stable, compatible with postsurgical/post treatment  scarring. No acute consolidative airspace disease. Several ground-glass and sub solid pulmonary nodules scattered in both lungs, not appreciably changed. Dominant 1.8 x 1.4 cm anterior right lower lobe sub solid nodule with 0.5 cm solid component (series 5/image 74), previously 1.7 x 1.4 cm with 0.5 cm solid component using similar measurement technique, stable. Subsolid posterior right lower lobe 0.7 cm nodule (series 5/image 64), previously 0.7 cm, stable. No new significant pulmonary nodules. Upper abdomen: Small hiatal hernia. Musculoskeletal: No aggressive appearing focal osseous lesions. Moderate thoracic spondylosis. IMPRESSION: 1. Stable exam. Stable postsurgical/post treatment scarring in the left upper lobe. No new or progressive findings to suggest metastatic disease. 2. Stable scattered ground-glass and subsolid pulmonary nodules in both lungs, largest 1.7 cm with 0.5 cm solid component in the anterior right lower lobe. Continued chest CT surveillance is warranted. 3. One vessel coronary atherosclerosis. 4. Aortic Atherosclerosis (ICD10-I70.0). Electronically Signed   By: Ilona Sorrel M.D.   On: 05/03/2020 13:59    ASSESSMENT AND PLAN: This is a very pleasant 79 years old white female with history of bilateral stage Ia non-small cell lung cancer involving the left upper lobe as well as the right upper lobe status post wedge resection of the 2 lesions in 2011 under the care of Dr. Arlyce Dice.  The patient also received adjuvant systemic chemotherapy for 6 cycles at [place] [facility] with carboplatin, Alimta and Avastin.  She also received stereotactic radiotherapy to the enlarging left upper lobe lung nodule in 2014.  The patient is currently on observation and she is feeling fine today with no concerning complaints. She had repeat CT scan of the chest performed recently.  I personally and independently reviewed the scan images and discussed the results with the patient today. Her scan showed a  stable disease. I recommended for her to continue on observation with repeat CT scan of the chest in 6 months. She was advised to call immediately if she has any concerning symptoms in the interval. The patient voices understanding of current disease status and treatment options and is in agreement with the current care plan.  All questions were answered. The patient knows to call the clinic with any problems, questions or concerns. We can certainly see the patient much sooner if necessary.   Disclaimer: This note was dictated with voice recognition software. Similar sounding words can inadvertently be transcribed and may not be corrected upon review.

## 2020-05-07 ENCOUNTER — Telehealth: Payer: Self-pay | Admitting: Internal Medicine

## 2020-05-07 DIAGNOSIS — H903 Sensorineural hearing loss, bilateral: Secondary | ICD-10-CM | POA: Diagnosis not present

## 2020-05-07 NOTE — Telephone Encounter (Signed)
Scheduled per los. Called and spoke with patient. Confirmed appt 

## 2020-05-10 ENCOUNTER — Other Ambulatory Visit: Payer: Self-pay

## 2020-05-10 ENCOUNTER — Ambulatory Visit (INDEPENDENT_AMBULATORY_CARE_PROVIDER_SITE_OTHER): Payer: Medicare Other

## 2020-05-10 DIAGNOSIS — Z23 Encounter for immunization: Secondary | ICD-10-CM | POA: Diagnosis not present

## 2020-05-29 ENCOUNTER — Encounter: Payer: Self-pay | Admitting: Family Medicine

## 2020-05-31 ENCOUNTER — Other Ambulatory Visit: Payer: Self-pay

## 2020-05-31 MED ORDER — AMPHETAMINE-DEXTROAMPHET ER 15 MG PO CP24
15.0000 mg | ORAL_CAPSULE | ORAL | 0 refills | Status: DC
Start: 2020-05-31 — End: 2020-08-10

## 2020-05-31 NOTE — Telephone Encounter (Signed)
Requested Prescriptions   Pending Prescriptions Disp Refills  . amphetamine-dextroamphetamine (ADDERALL XR) 15 MG 24 hr capsule 90 capsule 0    Sig: Take 1 capsule by mouth every morning.     Last OV 12/30/2019   Last written 02/13/2020

## 2020-06-04 ENCOUNTER — Encounter: Payer: Self-pay | Admitting: Family Medicine

## 2020-06-29 ENCOUNTER — Ambulatory Visit (INDEPENDENT_AMBULATORY_CARE_PROVIDER_SITE_OTHER): Payer: Medicare Other | Admitting: Family Medicine

## 2020-06-29 ENCOUNTER — Other Ambulatory Visit: Payer: Self-pay

## 2020-06-29 VITALS — BP 128/70 | HR 92 | Temp 97.6°F | Ht 67.0 in | Wt 178.4 lb

## 2020-06-29 DIAGNOSIS — F331 Major depressive disorder, recurrent, moderate: Secondary | ICD-10-CM | POA: Diagnosis not present

## 2020-06-29 DIAGNOSIS — Z23 Encounter for immunization: Secondary | ICD-10-CM | POA: Diagnosis not present

## 2020-06-29 MED ORDER — VORTIOXETINE HBR 10 MG PO TABS: 10.0000 mg | ORAL_TABLET | Freq: Every day | ORAL | 2 refills | Status: DC

## 2020-06-29 MED ORDER — ALPRAZOLAM 0.5 MG PO TABS: 0.5000 mg | ORAL_TABLET | Freq: Three times a day (TID) | ORAL | 0 refills | Status: DC | PRN

## 2020-06-29 NOTE — Patient Instructions (Signed)
° ° ° °  If you have lab work done today you will be contacted with your lab results within the next 2 weeks.  If you have not heard from us then please contact us. The fastest way to get your results is to register for My Chart. ° ° °IF you received an x-ray today, you will receive an invoice from [facility]. Please contact [facility] at [phone] with questions or concerns regarding your invoice.  ° °IF you received labwork today, you will receive an invoice from [facility]. Please contact [facility] at [phone] with questions or concerns regarding your invoice.  ° °Our billing staff will not be able to assist you with questions regarding bills from these companies. ° °You will be contacted with the lab results as soon as they are available. The fastest way to get your results is to activate your My Chart account. Instructions are located on the last page of this paperwork. If you have not heard from us regarding the results in 2 weeks, please contact this office. °  ° ° ° °

## 2020-06-29 NOTE — Progress Notes (Signed)
Subjective:    Patient ID: [name], female    DOB: 1940-11-24, 79 y.o.   MRN: [id number]  HPI  Patient reports that she feels depressed.  She has been on Cymbalta and bupropion for quite some time.  However for the last several months, she has lost hope.  She states that she has no desire.  She simply sits around her home and has no motivation to leave.  She has no motivation exercise.  All she does is sit in her chair and watch TV.  She is under tremendous stress caring for her husband who has MS as well as dementia.  As result of the stress and the depression she reports memory loss and trouble focusing.  She is having a hard time thinking of words that she wants to say.  She states that she is having trouble sleeping.  Therefore she is self-medicating.  She is taking 2 trazodone 50 mg tablets at night and 100 mg of Benadryl.  I immediately told the patient to stop doing this due to anticholinergic side effects.  Instead I want her to use Xanax 0.5 mg p.o. nightly until we have the depression under control.  She denies any manic thoughts.  She denies any delusions or hallucinations or suicidal ideation or homicidal ideation.  She does report anhedonia and depression and feeling sad. Past Medical History:  Diagnosis Date  . ADD (attention deficit disorder)   . Allergic rhinitis   . Anemia   . Anxiety   . Arrhythmia   . Arthritis    "hands, neck, right shoulder" (07/28/2015)  . Aseptic necrosis ([place])   . Asthma dxc'd 05/2015  . Basal cell cancer   . Bleeding stomach ulcer ~ 06/2005  . COPD (chronic obstructive pulmonary disease) ([place])   . Depression   . Diverticula of colon   . Dyspnea   . Esophagitis   . Gastric ulcer with hemorrhage   . GERD (gastroesophageal reflux disease)   . Heart murmur   . History of blood transfusion 06/2005   "related to pneumonia"  . History of kidney stones   . Hyperlipidemia   . Hypertension   . Hypothyroidism   . Kidney stones   . Lung  cancer ([place])    Non small cell lung cancer, adenocarcinoma with BAC 09/2009, VATS converted to wedge resection of left upper lobe mass  . Mitral valve prolapse    "no ORs" (07/28/2015)  . Osteopenia   . Pneumonia 1970's; 06/2005  . Sleep apnea dx'd 06/2015   on oxygen at nite at 2L/[place]   . Squamous cell skin cancer    Past Surgical History:  Procedure Laterality Date  . ABDOMINAL HYSTERECTOMY  1988  . COLONOSCOPY WITH PROPOFOL N/A 06/15/2017   Procedure: COLONOSCOPY WITH PROPOFOL;  Surgeon: Carol Ada, MD;  Location: [facility];  Service: Endoscopy;  Laterality: N/A;  . HAMMER TOE SURGERY Right ~ 2010  . SALPINGOOPHORECTOMY Left 1988  . SKIN CANCER EXCISION     "I've had severl cut off RLE; left foreqrm, nose under nose"  . THORACOTOMY Left 06/2005  . THORACOTOMY/LOBECTOMY  09/2009; 11/2009   left; right   Current Outpatient Medications on File Prior to Visit  Medication Sig Dispense Refill  . acetaminophen (TYLENOL) 325 MG tablet Take by mouth.    Marland Kitchen albuterol (PROAIR HFA) 108 (90 Base) MCG/ACT inhaler Inhale 1 puff into the lungs every 6 (six) hours as needed for wheezing or shortness of breath.    Marland Kitchen  albuterol (PROVENTIL) (2.5 MG/3ML) 0.083% nebulizer solution USE 1 VIAL (3 ML OR 2.5 MG TOTAL) VIA NEBULIZER EVERY 4 HOURS AS NEEDED FOR WHEEZING OR SHORTNESS OF BREATH 450 mL 3  . amphetamine-dextroamphetamine (ADDERALL XR) 15 MG 24 hr capsule Take 1 capsule by mouth every morning. 90 capsule 0  . buPROPion (WELLBUTRIN XL) 150 MG 24 hr tablet TAKE 1 TABLET DAILY 90 tablet 3  . Calcium Carbonate-Vit D-Min (CALCIUM 1200 PO) Take by mouth.    . Cyanocobalamin 1000 MCG CAPS Take by mouth.    . doxazosin (CARDURA) 4 MG tablet TAKE 1 TABLET DAILY 90 tablet 3  . DULoxetine (CYMBALTA) 60 MG capsule TAKE 1 CAPSULE DAILY 90 capsule 3  . fluticasone (FLONASE) 50 MCG/ACT nasal spray USE 2 SPRAYS IN EACH NOSTRIL DAILY 48 g 3  . folic acid (FOLVITE) 1 MG tablet Take by mouth.    . LEVOXYL 112 MCG  tablet TAKE 1 TABLET DAILY (DISCONTINUE LEVOTHYROXINE 100) 90 tablet 3  . losartan-hydrochlorothiazide (HYZAAR) 100-25 MG tablet TAKE 1 TABLET DAILY 90 tablet 3  . mometasone (ELOCON) 0.1 % cream Apply 1 application topically daily. 45 g 0  . Multiple Vitamins-Minerals (EMERGEN-C FIVE PO) Take by mouth.    Marland Kitchen omeprazole (PRILOSEC) 20 MG capsule Take 1 capsule (20 mg total) by mouth 2 (two) times daily before a meal. 90 capsule 3  . OXYGEN 2lpm with sleep    . pravastatin (PRAVACHOL) 40 MG tablet TAKE 1 TABLET DAILY 90 tablet 3  . thiamine (VITAMIN B-1) 100 MG tablet Take 100 mg by mouth daily.    . traZODone (DESYREL) 50 MG tablet TAKE 1 TABLET AT BEDTIME AS NEEDED FOR SLEEP 90 tablet 3  . TRELEGY ELLIPTA 100-62.5-25 MCG/INH AEPB USE 1 INHALATION DAILY (DISCONTINUE BEVESPI) 180 each 3  . VITAMIN D, CHOLECALCIFEROL, PO Take 1 tablet by mouth daily.     No current facility-administered medications on file prior to visit.   Allergies  Allergen Reactions  . Doxycycline Nausea Only  . Erythromycin Nausea Only  . Sulfonamide Derivatives Hives   Social History   Socioeconomic History  . Marital status: Married    Spouse name: Not on file  . Number of children: 3  . Years of education: Not on file  . Highest education level: Not on file  Occupational History  . Occupation: retired Optician, dispensing: RETIRED  Tobacco Use  . Smoking status: Former Smoker    Packs/day: 0.75    Years: 30.00    Pack years: 22.50    Types: Cigarettes    Quit date: 09/11/2004    Years since quitting: 15.8  . Smokeless tobacco: Never Used  Vaping Use  . Vaping Use: Never used  Substance and Sexual Activity  . Alcohol use: Yes    Alcohol/week: 7.0 standard drinks    Types: 7 Shots of liquor per week    Comment: 1-2 drinks nightly   . Drug use: No  . Sexual activity: Yes  Other Topics Concern  . Not on file  Social History Narrative  . Not on file   Social Determinants of Health   Financial  Resource Strain:   . Difficulty of Paying Living Expenses: Not on file  Food Insecurity:   . Worried About Charity fundraiser in the Last Year: Not on file  . Ran Out of Food in the Last Year: Not on file  Transportation Needs:   . Lack of Transportation (Medical): Not on file  .  Lack of Transportation (Non-Medical): Not on file  Physical Activity:   . Days of Exercise per Week: Not on file  . Minutes of Exercise per Session: Not on file  Stress:   . Feeling of Stress : Not on file  Social Connections:   . Frequency of Communication with Friends and Family: Not on file  . Frequency of Social Gatherings with Friends and Family: Not on file  . Attends Religious Services: Not on file  . Active Member of Clubs or Organizations: Not on file  . Attends Archivist Meetings: Not on file  . Marital Status: Not on file  Intimate Partner Violence:   . Fear of Current or Ex-Partner: Not on file  . Emotionally Abused: Not on file  . Physically Abused: Not on file  . Sexually Abused: Not on file      Review of Systems  Psychiatric/Behavioral: Positive for depression.  All other systems reviewed and are negative.      Objective:   Physical Exam Vitals reviewed.  Constitutional:      General: She is not in acute distress.    Appearance: Normal appearance. She is normal weight. She is not ill-appearing or toxic-appearing.  Cardiovascular:     Rate and Rhythm: Normal rate and regular rhythm.  Pulmonary:     Effort: Pulmonary effort is normal.     Breath sounds: Normal breath sounds.  Neurological:     Mental Status: She is alert.           Assessment & Plan:  Need for immunization against influenza - Plan: Flu Vaccine QUAD High Dose(Fluad)  MDD (major depressive disorder), recurrent episode, moderate ([place])  I believe the stress of caring for her husband with dementia, the loss of her son earlier this year, and her general declining health have all contributed to  worsen her depression.  Discontinue Cymbalta as the patient has been on this for many years and start Trintellix 10 mg daily.  Recheck here in 3 weeks or sooner if worsening.  Stop using trazodone and Benadryl at night.  Explained the risk of anticholinergic side effects.  Instead temporarily use Xanax at night to help her self sleep.  See the patient back in 3 weeks.  If this is not working, could consider supplementing with [place] or Abilify.

## 2020-07-06 ENCOUNTER — Other Ambulatory Visit: Payer: Self-pay

## 2020-07-06 ENCOUNTER — Encounter: Payer: Self-pay | Admitting: Internal Medicine

## 2020-07-06 ENCOUNTER — Ambulatory Visit (INDEPENDENT_AMBULATORY_CARE_PROVIDER_SITE_OTHER): Payer: Medicare Other | Admitting: Internal Medicine

## 2020-07-06 DIAGNOSIS — J9611 Chronic respiratory failure with hypoxia: Secondary | ICD-10-CM | POA: Diagnosis not present

## 2020-07-06 DIAGNOSIS — J449 Chronic obstructive pulmonary disease, unspecified: Secondary | ICD-10-CM | POA: Diagnosis not present

## 2020-07-06 NOTE — Patient Instructions (Signed)
Continue Trelegy each am and 0xygen 2lpm at bedtime but you do not need a portable system at this point  Only use your albuterol as a rescue medication to be used if you can't catch your breath by resting or doing a relaxed purse lip breathing pattern.  - The less you use it, the better it will work when you need it. - Ok to use up to 2 puffs  every 4 hours if you must but call for immediate appointment if use goes up over your usual need - Don't leave home without it !!  (think of it like the spare tire for your car)  Try albuterol 15 min before an activity (on alternate days)  that you know would make you short of breath and see if it makes any difference and if makes none then don't take it after activity unless you can't catch your breath.    If you are satisfied with your treatment plan,  let your doctor know and he/she can either refill your medications or you can return here when your prescription runs out.     If in any way you are not 100% satisfied,  please tell us.  If 100% better, tell your friends!  Pulmonary follow up is as needed

## 2020-07-06 NOTE — Progress Notes (Signed)
Subjective:    Patient ID: [name], female   DOB: 06/14/1941   MRN: [id number]      Brief patient profile:  82 yowf outpt director at [employer] who quit smoking 2006 with L empyema and never completely  recovered activity tol eval by [facility] and [facility] with polycthemia assoc with  noct desat min copd >  2lpm x hs only x 2017 per [facility]t but requested transfer of care to [place] so self referred to pulmonary clinic 06/29/2017        History of Present Illness  06/29/2017 1st [facility] office visit/ [name]   Chief Complaint  Patient presents with  . Pulmonary Consult    Self referral. She has seen Dr. Lamonte Sakai in the past- last in 2012. She has been seeing Dr. Corrin Parker at [facility] and is here today to re-est care. She c/o DOE with cleaning her house or taking out the trash. She is on O2 with sleep at 2lpm. She uses proiar 1 x per wk on average and has a neb with albuterol that she rarely uses.   doe x [facility] = can't walk a nl pace on a flat grade s sob but does fine slow and flat eg shopping at slow pace / can't do ramps Doe Never changed since empyema despite having RT and chemo for BAC dx 09/14/09 at [facility] with [facility] 11/2009 and persistent bilateral MPN's under surveillance at [facility] but no recent bx's or further chemo.  Uses  advair 250 twice daily ? Benefit > saba doesn't really help any of her symptoms/ no signifcant chronic or assoc  Coughing. rec Plan A = Automatic = bevespi Take 2 puffs first thing in am and then another 2 puffs about 12 hours later.  Work on inhaler technique: Plan B = Backup Only use your albuterol as a rescue medication  Plan C = Crisis - only use your albuterol nebulizer if you first try Plan B and it fails to help > ok to use the nebulizer up to every 4 hours but if start needing it regularly call for immediate appointment    08/10/2017  f/u ov/[name] re: GOLD II COPD  Chief Complaint  Patient presents with  . Follow-up    Breathing is doing  well.  She uses her albuterol inhaler and neb rarely.    doe improved to [facility] = can walk nl pace, flat grade, can't hurry or go uphills or steps s sob   No noct symtoms   rec Continue the Bevespi Take 2 puffs first thing in am and then another 2 puffs about 12 hours later.          07/19/2018  f/u ov/[name] re: copd gold II / nasal congestion main concern  Chief Complaint  Patient presents with  . Follow-up    Breathing is unchanged since last OV. PFT completed today.  Dyspnea:  MMRC2 = can't walk a nl pace on a flat grade s sob but does fine slow and flat / limited by back pain more than breathing  Cough: min in am/ attributes to pnds some better with otc's ? Not sure names  Sleeping: bed flat / on side/ 2 pllows SABA use: 2-3 x weekly 02:  2lpm hs rec No change in your medications  Work on inhaler technique:   schedule sinus ct >>  07/22/18 No definite abnormality seen in maxillofacial region.     02/05/2019 acute extended ov/[name] re: sob / gold II on  bevespi  maint Chief Complaint  Patient presents with  . Acute Visit    SOB off and on since March 2020- she was sick in Feb 2020 also with fever when returned home from [place]. She has some sore throat. She has had fever 5-6 days ago.   always has R nasal congestion x decades  On antihisatamine/deoncongestant taking twice daily x years Flew to [place] in Feb 2020 felt was coming down with uti (incont/hematuria/feverish)  rx macodantin then cipro > def improvement but still filling feverish/weak  so flew home week early new increased sob /coughing more > saw [name] / [name] NP and then [facility] phone care "just felt sick"  rx zpak / prednisone and back to baseline for only a day then feverish again with sweats but no chills and worse sob but no cough until nasal flare 02/01/19 > rx  Keflex then  levaquin  Fever is gone / minimal cough still slt green esp in am  When walking 02 always goes up  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  Please schedule a follow up office visit in 4 weeks, sooner if needed  with all medications /inhalers/ solutions in hand so we can verify exactly what you are taking. This includes all medications from all doctors and over the counters     03/10/2019  f/u ov/[name] re: did not remember meds - no longer using rescue inhaler or neb  Chief Complaint  Patient presents with  . Follow-up    Breathing has improved some.   Dyspnea:  Maybe 100 ft to mb and has to stop half way x years/ minimal incline / better p depomedrol then back to baseline  Cough: no cough/ still sense of throat congestion Sleeping: 2 pillows  SABA use:  02: 2lpm hs  Nose gets stopped up w/in a few min of being outdoors despite neg allergy profile  rec Change prilosec 20 mg Take 30- 60 min before your first and last meals of the day  Depomedrol 120 mg IM   Plan A = Automatic = symbicort 160 Take 2 puffs first thing in am and then another 2 puffs about 12 hours later.  Work on inhaler technique:   Plan B = Backup Only use your albuterol inhaler as a rescue medication  Plan C = Crisis - only use your albuterol nebulizer if you first try Plan B and it fails to help > ok to use the nebulizer up to every 4 hours but if start needing it regularly call for immediate appointment Please schedule a follow up office visit in 4 weeks, sooner if needed  with all medications /inhalers/ solutions in hand so we can verify exactly what you are taking. This includes all medications from all doctors and over the counters   04/09/2019  f/u ov/[name]  re: GOLD II copd/ 02 bedtime only  / changed to trelegy due to insurance Chief Complaint  Patient presents with  . Follow-up    Breathing is doing well-  she has been using trelegy. She is using her albuterol inhaler 1-2 x per day and has not used her neb.   Dyspnea:  Says doing better but really not doing anything strenuous  Cough: congested  Sleeping: flat bed, 2 pillows  SABA use: twice daily  Typically p walking 02: says 02 drops p ex but only p sits down rec Walk regularly and monitor your 02 while actively walking, not after you stop and it's dropping we need to see you back you   07/06/2020  f/u ov/[name] re: GOLD II / 02 2lpm hs and trelegy / not monitoring sats as rec Chief Complaint  Patient presents with  . Follow-up    1 year rov copd.  needs O2 requalification- wears qhs.    Dyspnea:  mb and back and uses saba freq  Cough: none  Sleeping: bed is flat / 2 pillows/ SABA use: twice daily usually after over does it (opposite the instructions given) 02: 2lpm hs  - not checking sats with ex    No obvious day to day or daytime variability or assoc excess/ purulent sputum or mucus plugs or hemoptysis or cp or chest tightness, subjective wheeze or overt sinus or hb symptoms.   Sleeping  without nocturnal  or early am exacerbation  of respiratory  c/o's or need for noct saba. Also denies any obvious fluctuation of symptoms with weather or environmental changes or other aggravating or alleviating factors except as outlined above   No unusual exposure hx or h/o childhood pna/ asthma or knowledge of premature birth.  Current Allergies, Complete Past Medical History, Past Surgical History, Family History, and Social History were reviewed in [employer] record.  ROS  The following are not active complaints unless bolded Hoarseness, sore throat, dysphagia, dental problems, itching, sneezing,  nasal congestion or discharge of excess mucus or purulent secretions, ear  ache,   fever, chills, sweats, unintended wt loss or wt gain, classically pleuritic or exertional cp,  orthopnea pnd or arm/hand swelling  or leg swelling, presyncope, palpitations, abdominal pain, anorexia, nausea, vomiting, diarrhea  or change in bowel habits or change in bladder habits, change in stools or change in urine, dysuria, hematuria,  rash, arthralgias, visual complaints, headache, numbness, weakness or ataxia or problems with walking or coordination,  change in mood or  memory.        Current Meds  Medication Sig  . acetaminophen (TYLENOL) 325 MG tablet Take by mouth.  Marland Kitchen albuterol (PROAIR HFA) 108 (90 Base) MCG/ACT inhaler Inhale 1 puff into the lungs every 6 (six) hours as needed for wheezing or shortness of breath.  Marland Kitchen albuterol (PROVENTIL) (2.5 MG/3ML) 0.083% nebulizer solution USE 1 VIAL (3 ML OR 2.5 MG TOTAL) VIA NEBULIZER EVERY 4 HOURS AS NEEDED FOR WHEEZING OR SHORTNESS OF BREATH  . ALPRAZolam (XANAX) 0.5 MG tablet Take 1 tablet (0.5 mg total) by mouth 3 (three) times daily as needed.  Marland Kitchen amphetamine-dextroamphetamine (ADDERALL XR) 15 MG 24 hr capsule Take 1 capsule by mouth every morning.  Marland Kitchen buPROPion (WELLBUTRIN XL) 150 MG 24 hr tablet TAKE 1 TABLET DAILY  . Calcium Carbonate-Vit D-Min (CALCIUM 1200 PO) Take by mouth.  . Cyanocobalamin 1000 MCG CAPS Take by mouth.  . doxazosin (CARDURA) 4 MG tablet TAKE 1 TABLET DAILY  . DULoxetine (CYMBALTA) 60 MG capsule TAKE 1 CAPSULE DAILY  . fluticasone (FLONASE) 50 MCG/ACT nasal spray USE 2 SPRAYS IN EACH NOSTRIL DAILY  . folic acid (FOLVITE) 1 MG tablet  Take by mouth.  . LEVOXYL 112 MCG tablet TAKE 1 TABLET DAILY (DISCONTINUE LEVOTHYROXINE 100)  . losartan-hydrochlorothiazide (HYZAAR) 100-25 MG tablet TAKE 1 TABLET DAILY  . mometasone (ELOCON) 0.1 % cream Apply 1 application topically daily.  . Multiple Vitamins-Minerals (EMERGEN-C FIVE PO) Take by mouth.  Marland Kitchen omeprazole (PRILOSEC) 20 MG capsule Take 1 capsule (20 mg total) by mouth 2  (two) times daily before a meal.  . OXYGEN 2lpm with sleep  . pravastatin (PRAVACHOL) 40 MG tablet TAKE 1 TABLET DAILY  . thiamine (VITAMIN B-1) 100 MG tablet Take 100 mg by mouth daily.  . traZODone (DESYREL) 50 MG tablet TAKE 1 TABLET AT BEDTIME AS NEEDED FOR SLEEP  . TRELEGY ELLIPTA 100-62.5-25 MCG/INH AEPB USE 1 INHALATION DAILY (DISCONTINUE BEVESPI)  . VITAMIN D, CHOLECALCIFEROL, PO Take 1 tablet by mouth daily.  Marland Kitchen vortioxetine HBr (TRINTELLIX) 10 MG TABS tablet Take 1 tablet (10 mg total) by mouth daily.                              Objective:   Physical Exam  Anxious amb wf nad   07/06/2020     183  04/09/2019       173  03/10/2019       179 02/05/2019       179  07/19/2018       173  06/19/2018       175  08/10/2017     170   06/29/17 166 lb (75.3 kg)  05/28/17 164 lb (74.4 kg)  05/03/17 166 lb (75.3 kg)     Vital signs reviewed  07/06/2020  - Note at rest 02 sats  96% on RA     HEENT : pt wearing mask not removed for exam due to covid - 19 concerns.    NECK :  without JVD/Nodes/TM/ nl carotid upstrokes bilaterally   LUNGS: no acc muscle use,  Mild barrel  contour chest wall with bilateral  Distant bs s audible wheeze and  without cough on insp or exp maneuvers  and mild  Hyperresonant  to  percussion bilaterally     CV:  RRR  no s3 or murmur or increase in P2, and no edema   ABD:  soft and nontender with pos end  insp Hoover's  in the supine position. No bruits or organomegaly appreciated, bowel sounds nl  MS:  Somewhat waddling awkward gait/  ext warm without deformities, calf tenderness, cyanosis or clubbing No obvious joint restrictions   SKIN: warm and dry without lesions    NEURO:  alert, approp, nl sensorium with  no motor or cerebellar deficits apparent.         I personally reviewed images and agree with radiology impression as follows:   Chest CT 05/03/20 1. Stable exam. Stable postsurgical/post treatment scarring in the left upper lobe.  No new or progressive findings to suggest metastatic disease. 2. Stable scattered ground-glass and subsolid pulmonary nodules in both lungs, largest 1.7 cm with 0.5 cm solid component in the anterior right lower lobe. Continued chest CT surveillance is warranted.    Assessment:

## 2020-07-06 NOTE — Assessment & Plan Note (Addendum)
See care everywhere pulmonary notes from 2016/ Govert > rx 2lpm hs  - 06/19/2018  Walked RA x 3 laps @ 185 ft each stopped due to  End of study, moderately fast pace, no desat but sob at end    - 04/09/2019   [facility] RA  2 laps @  approx 219ft each @ moderate pace  stopped due to  Sob at end but sats still 91%  -  07/06/2020   Walked RA  approx   750 ft  @ moderate pace /awkward gait due to back pain  stopped due to  [employer] with sats still 90%      Pt has concentrator at home originally rx Dr Corrin Parker  - she uses it hs and seems to be benefiting from it so rec she continue 2lpm and pace herself when exerting but continue to monitor sats at peak levels at least weekly to see if would benefit from amb 02  - ONO on RA rec for insurance purposes 07/06/2020              Each maintenance medication was reviewed in detail including emphasizing most importantly the difference between maintenance and prns and under what circumstances the prns are to be triggered using an action plan format where appropriate.  Total time for H and P, chart review, counseling,reviewings devices and generating customized AVS unique to this office visit / charting = 30 min

## 2020-07-06 NOTE — Assessment & Plan Note (Signed)
Quit smoking 2006 PFT's  08/07/11  FEV1 2.14 (89 % ) ratio 69  with DLCO  73 % corrects to 108 % for alv volume  - Spirometry 06/29/2017  FEV1 1.24 (52%)  Ratio 64 on advair -  Allergy profile 06/29/17  >  Eos 0.2/  IgE  13 RAST neg  - 06/29/2017  try bevespi  2bid > improved as of 08/10/18  -  Spirometry 06/19/2018  FEV1 1.4 (57%)  Ratio 64 p bevespi x 2 pffs   - PFT's  07/19/2018  FEV1 1.63  (74 % ) ratio 68  p 13 % improvement from saba p nothing prior to study with DLCO  71/70c % corrects to 93 % for alv volume    - 02/05/2019   continue bevespi  - 03/2019 changed to trelegy due to insurance    Group D in terms of symptom/risk and laba/lama/ICS  therefore appropriate rx at this point >>>  Continue trelegy and prn saba  I spent extra time with pt today reviewing appropriate use of albuterol for prn use on exertion with the following points: 1) saba is for relief of sob that does not improve by walking a slower pace or resting but rather if the pt does not improve after trying this first. 2) If the pt is convinced, as many are, that saba helps recover from activity faster then it's easy to tell if this is the case by re-challenging : ie stop, take the inhaler, then p 5 minutes try the exact same activity (intensity of workload) that just caused the symptoms and see if they are substantially diminished or not after saba 3) if there is an activity that reproducibly causes the symptoms, try the saba 15 min before the activity on alternate days   If in fact the saba really does help, then fine to continue to use it prn but advised may need to look closer at the maintenance regimen being used to achieve better control of airways disease with exertion.

## 2020-07-07 ENCOUNTER — Encounter: Payer: Self-pay | Admitting: Internal Medicine

## 2020-07-15 ENCOUNTER — Ambulatory Visit (INDEPENDENT_AMBULATORY_CARE_PROVIDER_SITE_OTHER): Payer: Medicare Other | Admitting: Family Medicine

## 2020-07-15 ENCOUNTER — Other Ambulatory Visit: Payer: Self-pay

## 2020-07-15 VITALS — BP 130/80 | HR 94 | Temp 97.4°F | Ht 67.0 in | Wt 183.0 lb

## 2020-07-15 DIAGNOSIS — L299 Pruritus, unspecified: Secondary | ICD-10-CM

## 2020-07-15 MED ORDER — HYDROXYZINE HCL 25 MG PO TABS: 25.0000 mg | ORAL_TABLET | Freq: Three times a day (TID) | ORAL | 0 refills | Status: DC | PRN

## 2020-07-15 MED ORDER — TRIAMCINOLONE ACETONIDE 0.1 % EX CREA: 1.0000 "application " | TOPICAL_CREAM | Freq: Two times a day (BID) | CUTANEOUS | 0 refills | Status: DC

## 2020-07-15 NOTE — Progress Notes (Signed)
Subjective:    Patient ID: [name], female    DOB: 10/08/1940, 79 y.o.   MRN: [id number]  Depression        06/29/20 Patient reports that she feels depressed.  She has been on Cymbalta and bupropion for quite some time.  However for the last several months, she has lost hope.  She states that she has no desire.  She simply sits around her home and has no motivation to leave.  She has no motivation exercise.  All she does is sit in her chair and watch TV.  She is under tremendous stress caring for her husband who has MS as well as dementia.  As result of the stress and the depression she reports memory loss and trouble focusing.  She is having a hard time thinking of words that she wants to say.  She states that she is having trouble sleeping.  Therefore she is self-medicating.  She is taking 2 trazodone 50 mg tablets at night and 100 mg of Benadryl.  I immediately told the patient to stop doing this due to anticholinergic side effects.  Instead I want her to use Xanax 0.5 mg p.o. nightly until we have the depression under control.  She denies any manic thoughts.  She denies any delusions or hallucinations or suicidal ideation or homicidal ideation.  She does report anhedonia and depression and feeling sad.  At that time, my plan was: I believe the stress of caring for her husband with dementia, the loss of her son earlier this year, and her general declining health have all contributed to worsen her depression.  Discontinue Cymbalta as the patient has been on this for many years and start Trintellix 10 mg daily.  Recheck here in 3 weeks or sooner if worsening.  Stop using trazodone and Benadryl at night.  Explained the risk of anticholinergic side effects.  Instead temporarily use Xanax at night to help her self sleep.  See the patient back in 3 weeks.  If this is not working, could consider supplementing with Estelline or Abilify.  07/15/20 Patient is not sure that she seen any benefit  since switching from Cymbalta to Trintellix.  However she continues to have trouble sleeping at night.  She states that she wakes up about every day at 3 AM unable to sleep more.  She also reports itching all over her body.  She is scratching and clawing at the skin of the dorsums of her forearms as well as on her anterior shins bilaterally.  There is no visible rash.  There is no erythema.  There is no excoriations.  There is no explanation for the itching on visual inspection of the skin.  There is no evidence of jaundice.  She does have a history of polycythemia and she states that the itching feels similar to when her blood counts would rise in the past. Past Medical History:  Diagnosis Date  . ADD (attention deficit disorder)   . Allergic rhinitis   . Anemia   . Anxiety   . Arrhythmia   . Arthritis    "hands, neck, right shoulder" (07/28/2015)  . Aseptic necrosis ([place])   . Asthma dxc'd 05/2015  . Basal cell cancer   . Bleeding stomach ulcer ~ 06/2005  . COPD (chronic obstructive pulmonary disease) ([place])   . Depression   . Diverticula of colon   . Dyspnea   . Esophagitis   . Gastric ulcer with hemorrhage   . GERD (gastroesophageal  reflux disease)   . Heart murmur   . History of blood transfusion 06/2005   "related to pneumonia"  . History of kidney stones   . Hyperlipidemia   . Hypertension   . Hypothyroidism   . Kidney stones   . Lung cancer ([place])    Non small cell lung cancer, adenocarcinoma with BAC 09/2009, VATS converted to wedge resection of left upper lobe mass  . Mitral valve prolapse    "no ORs" (07/28/2015)  . Osteopenia   . Pneumonia 1970's; 06/2005  . Sleep apnea dx'd 06/2015   on oxygen at nite at 2L/[place]   . Squamous cell skin cancer    Past Surgical History:  Procedure Laterality Date  . ABDOMINAL HYSTERECTOMY  1988  . COLONOSCOPY WITH PROPOFOL N/A 06/15/2017   Procedure: COLONOSCOPY WITH PROPOFOL;  Surgeon: Carol Ada, MD;  Location: [facility];   Service: Endoscopy;  Laterality: N/A;  . HAMMER TOE SURGERY Right ~ 2010  . SALPINGOOPHORECTOMY Left 1988  . SKIN CANCER EXCISION     "I've had severl cut off RLE; left foreqrm, nose under nose"  . THORACOTOMY Left 06/2005  . THORACOTOMY/LOBECTOMY  09/2009; 11/2009   left; right   Current Outpatient Medications on File Prior to Visit  Medication Sig Dispense Refill  . acetaminophen (TYLENOL) 325 MG tablet Take by mouth.    Marland Kitchen albuterol (PROAIR HFA) 108 (90 Base) MCG/ACT inhaler Inhale 1 puff into the lungs every 6 (six) hours as needed for wheezing or shortness of breath.    Marland Kitchen albuterol (PROVENTIL) (2.5 MG/3ML) 0.083% nebulizer solution USE 1 VIAL (3 ML OR 2.5 MG TOTAL) VIA NEBULIZER EVERY 4 HOURS AS NEEDED FOR WHEEZING OR SHORTNESS OF BREATH 450 mL 3  . ALPRAZolam (XANAX) 0.5 MG tablet Take 1 tablet (0.5 mg total) by mouth 3 (three) times daily as needed. 30 tablet 0  . amphetamine-dextroamphetamine (ADDERALL XR) 15 MG 24 hr capsule Take 1 capsule by mouth every morning. 90 capsule 0  . buPROPion (WELLBUTRIN XL) 150 MG 24 hr tablet TAKE 1 TABLET DAILY 90 tablet 3  . Calcium Carbonate-Vit D-Min (CALCIUM 1200 PO) Take by mouth.    . Cyanocobalamin 1000 MCG CAPS Take by mouth.    . doxazosin (CARDURA) 4 MG tablet TAKE 1 TABLET DAILY 90 tablet 3  . DULoxetine (CYMBALTA) 60 MG capsule TAKE 1 CAPSULE DAILY 90 capsule 3  . fluticasone (FLONASE) 50 MCG/ACT nasal spray USE 2 SPRAYS IN EACH NOSTRIL DAILY 48 g 3  . folic acid (FOLVITE) 1 MG tablet Take by mouth.    . LEVOXYL 112 MCG tablet TAKE 1 TABLET DAILY (DISCONTINUE LEVOTHYROXINE 100) 90 tablet 3  . losartan-hydrochlorothiazide (HYZAAR) 100-25 MG tablet TAKE 1 TABLET DAILY 90 tablet 3  . mometasone (ELOCON) 0.1 % cream Apply 1 application topically daily. 45 g 0  . Multiple Vitamins-Minerals (EMERGEN-C FIVE PO) Take by mouth.    Marland Kitchen omeprazole (PRILOSEC) 20 MG capsule Take 1 capsule (20 mg total) by mouth 2 (two) times daily before a meal. 90  capsule 3  . OXYGEN 2lpm with sleep    . pravastatin (PRAVACHOL) 40 MG tablet TAKE 1 TABLET DAILY 90 tablet 3  . thiamine (VITAMIN B-1) 100 MG tablet Take 100 mg by mouth daily.    . traZODone (DESYREL) 50 MG tablet TAKE 1 TABLET AT BEDTIME AS NEEDED FOR SLEEP 90 tablet 3  . TRELEGY ELLIPTA 100-62.5-25 MCG/INH AEPB USE 1 INHALATION DAILY (DISCONTINUE BEVESPI) 180 each 3  . VITAMIN D,  CHOLECALCIFEROL, PO Take 1 tablet by mouth daily.    Marland Kitchen vortioxetine HBr (TRINTELLIX) 10 MG TABS tablet Take 1 tablet (10 mg total) by mouth daily. 30 tablet 2   No current facility-administered medications on file prior to visit.   Allergies  Allergen Reactions  . Doxycycline Nausea Only  . Erythromycin Nausea Only  . Sulfonamide Derivatives Hives   Social History   Socioeconomic History  . Marital status: Married    Spouse name: Not on file  . Number of children: 3  . Years of education: Not on file  . Highest education level: Not on file  Occupational History  . Occupation: retired Optician, dispensing: RETIRED  Tobacco Use  . Smoking status: Former Smoker    Packs/day: 0.75    Years: 30.00    Pack years: 22.50    Types: Cigarettes    Quit date: 09/11/2004    Years since quitting: 15.8  . Smokeless tobacco: Never Used  Vaping Use  . Vaping Use: Never used  Substance and Sexual Activity  . Alcohol use: Yes    Alcohol/week: 7.0 standard drinks    Types: 7 Shots of liquor per week    Comment: 1-2 drinks nightly   . Drug use: No  . Sexual activity: Yes  Other Topics Concern  . Not on file  Social History Narrative  . Not on file   Social Determinants of Health   Financial Resource Strain:   . Difficulty of Paying Living Expenses: Not on file  Food Insecurity:   . Worried About Charity fundraiser in the Last Year: Not on file  . Ran Out of Food in the Last Year: Not on file  Transportation Needs:   . Lack of Transportation (Medical): Not on file  . Lack of Transportation  (Non-Medical): Not on file  Physical Activity:   . Days of Exercise per Week: Not on file  . Minutes of Exercise per Session: Not on file  Stress:   . Feeling of Stress : Not on file  Social Connections:   . Frequency of Communication with Friends and Family: Not on file  . Frequency of Social Gatherings with Friends and Family: Not on file  . Attends Religious Services: Not on file  . Active Member of Clubs or Organizations: Not on file  . Attends Archivist Meetings: Not on file  . Marital Status: Not on file  Intimate Partner Violence:   . Fear of Current or Ex-Partner: Not on file  . Emotionally Abused: Not on file  . Physically Abused: Not on file  . Sexually Abused: Not on file      Review of Systems  Psychiatric/Behavioral: Positive for depression.  All other systems reviewed and are negative.      Objective:   Physical Exam Vitals reviewed.  Constitutional:      General: She is not in acute distress.    Appearance: Normal appearance. She is normal weight. She is not ill-appearing or toxic-appearing.  Cardiovascular:     Rate and Rhythm: Normal rate and regular rhythm.  Pulmonary:     Effort: Pulmonary effort is normal.     Breath sounds: Normal breath sounds.  Neurological:     Mental Status: She is alert.           Assessment & Plan:  [facility] - Plan: CBC with Differential/Platelet, COMPLETE METABOLIC PANEL WITH GFR  Physical exam is normal.  Check CBC to rule out polycythemia.  Check CMP to check for kidney or liver abnormalities that can cause itching.  If normal, I question if this could be from reducing her trazodone and Benadryl, stopping her Cymbalta, starting Trintellix.  The other possibility would be "neurodermatitis/neuropathic itching".  Try hydroxyzine 25 mg every 8 hours as needed for itching or for insomnia to see if this helps and await the results of the lab test.  If the depression is not better in 2 weeks and the itching is not  better, I would recommend stopping Trintellix and resuming Cymbalta but then possibly adding Rexulti to Cymbalta.

## 2020-07-16 LAB — COMPLETE METABOLIC PANEL WITH GFR
AG Ratio: 1.8 (calc) (ref 1.0–2.5)
ALT: 21 U/L (ref 6–29)
AST: 23 U/L (ref 10–35)
Albumin: 4.2 g/dL (ref 3.6–5.1)
Alkaline phosphatase (APISO): 64 U/L (ref 37–153)
BUN: 12 mg/dL (ref 7–25)
CO2: 27 mmol/L (ref 20–32)
Calcium: 9.6 mg/dL (ref 8.6–10.4)
Chloride: 102 mmol/L (ref 98–110)
Creat: 0.76 mg/dL (ref 0.60–0.93)
GFR, Est African American: 87 mL/min/{1.73_m2} (ref >=60)
GFR, Est Non African American: 75 mL/min/{1.73_m2} (ref >=60)
Globulin: 2.3 g/dL (calc) (ref 1.9–3.7)
Glucose, Bld: 117 mg/dL — ABNORMAL HIGH (ref 65–99)
Potassium: 4.2 mmol/L (ref 3.5–5.3)
Sodium: 139 mmol/L (ref 135–146)
Total Bilirubin: 0.4 mg/dL (ref 0.2–1.2)
Total Protein: 6.5 g/dL (ref 6.1–8.1)

## 2020-07-16 LAB — CBC WITH DIFFERENTIAL/PLATELET
Absolute Monocytes: 733 cells/uL (ref 200–950)
Basophils Absolute: 59 cells/uL (ref 0–200)
Basophils Relative: 0.8 %
Eosinophils Absolute: 259 cells/uL (ref 15–500)
Eosinophils Relative: 3.5 %
HCT: 42 % (ref 35.0–45.0)
Hemoglobin: 14.1 g/dL (ref 11.7–15.5)
Lymphs Abs: 2050 cells/uL (ref 850–3900)
MCH: 34 pg — ABNORMAL HIGH (ref 27.0–33.0)
MCHC: 33.6 g/dL (ref 32.0–36.0)
MCV: 101.2 fL — ABNORMAL HIGH (ref 80.0–100.0)
MPV: 10.8 fL (ref 7.5–12.5)
Monocytes Relative: 9.9 %
Neutro Abs: 4299 cells/uL (ref 1500–7800)
Neutrophils Relative %: 58.1 %
Platelets: 287 10*3/uL (ref 140–400)
RBC: 4.15 10*6/uL (ref 3.80–5.10)
RDW: 12.2 % (ref 11.0–15.0)
Total Lymphocyte: 27.7 %
WBC: 7.4 10*3/uL (ref 3.8–10.8)

## 2020-07-19 ENCOUNTER — Ambulatory Visit: Payer: Medicare Other | Admitting: Family Medicine

## 2020-07-22 ENCOUNTER — Telehealth: Payer: Medicare Other | Admitting: Physician Assistant

## 2020-07-22 DIAGNOSIS — H109 Unspecified conjunctivitis: Secondary | ICD-10-CM | POA: Diagnosis not present

## 2020-07-22 MED ORDER — POLYMYXIN B-TRIMETHOPRIM 10000-0.1 UNIT/ML-% OP SOLN: 1.0000 [drp] | Freq: Four times a day (QID) | OPHTHALMIC | 0 refills | Status: AC

## 2020-07-22 NOTE — Progress Notes (Signed)
E-Visit for [employer]   We are sorry that you are not feeling well.  Here is how we plan to help!  Based on what you have shared with me it looks like you have conjunctivitis.  Conjunctivitis is a common inflammatory or infectious condition of the eye that is often referred to as "pink eye".  In most cases it is contagious (viral or bacterial). However, not all conjunctivitis requires antibiotics (ex. Allergic).  We have made appropriate suggestions for you based upon your presentation.  I have prescribed Polytrim Ophthalmic drops 1-2 drops 4 times a day times 5 days  Pink eye can be highly contagious.  It is typically spread through direct contact with secretions, or contaminated objects or surfaces that one may have touched.  Strict handwashing is suggested with soap and water is urged.  If not available, use alcohol based had sanitizer.  Avoid unnecessary touching of the eye.  If you wear contact lenses, you will need to refrain from wearing them until you see no white discharge from the eye for at least 24 hours after being on medication.  You should see symptom improvement in 1-2 days after starting the medication regimen.  Call us if symptoms are not improved in 1-2 days.  Home Care:  Wash your hands often!  Do not wear your contacts until you complete your treatment plan.  Avoid sharing towels, bed linen, personal items with a person who has pink eye.  See attention for anyone in your home with similar symptoms.  Get Help Right Away If:  Your symptoms do not improve.  You develop blurred or loss of vision.  Your symptoms worsen (increased discharge, pain or redness)  Your e-visit answers were reviewed by a board certified advanced clinical practitioner to complete your personal care plan.  Depending on the condition, your plan could have included both over the counter or prescription medications.  If there is a problem please reply  once you have received a response from your  provider.  Your safety is important to [place].  If you have drug allergies check your prescription carefully.    You can use MyChart to ask questions about today's visit, request a non-urgent call back, or ask for a work or school excuse for 24 hours related to this e-Visit. If it has been greater than 24 hours you will need to follow up with your provider, or enter a new e-Visit to address those concerns.   You will get an e-mail in the next two days asking about your experience.  I hope that your e-visit has been valuable and will speed your recovery. Thank you for using e-visits.     Greater than 5 minutes, yet less than 10 minutes of time have been spent researching, coordinating and implementing care for this patient today.

## 2020-07-27 ENCOUNTER — Encounter: Payer: Self-pay | Admitting: Family Medicine

## 2020-07-29 ENCOUNTER — Other Ambulatory Visit: Payer: Self-pay

## 2020-07-29 ENCOUNTER — Ambulatory Visit
Admission: RE | Admit: 2020-07-29 | Discharge: 2020-07-29 | Disposition: A | Discharge status: Home / Self Care | Payer: Medicare Other | Source: Ambulatory Visit | Attending: Emergency Medicine | Admitting: Emergency Medicine

## 2020-07-29 VITALS — BP 126/74 | HR 92 | Temp 98.4°F | Resp 24

## 2020-07-29 DIAGNOSIS — J0140 Acute pansinusitis, unspecified: Secondary | ICD-10-CM | POA: Diagnosis not present

## 2020-07-29 DIAGNOSIS — R6889 Other general symptoms and signs: Secondary | ICD-10-CM | POA: Diagnosis not present

## 2020-07-29 DIAGNOSIS — R059 Cough, unspecified: Secondary | ICD-10-CM

## 2020-07-29 DIAGNOSIS — J441 Chronic obstructive pulmonary disease with (acute) exacerbation: Secondary | ICD-10-CM

## 2020-07-29 MED ORDER — PREDNISONE 10 MG (21) PO TBPK: ORAL_TABLET | Freq: Every day | ORAL | 0 refills | Status: DC

## 2020-07-29 MED ORDER — BENZONATATE 100 MG PO CAPS: 100.0000 mg | ORAL_CAPSULE | Freq: Three times a day (TID) | ORAL | 0 refills | Status: DC

## 2020-07-29 MED ORDER — AMOXICILLIN-POT CLAVULANATE 875-125 MG PO TABS: 1.0000 | ORAL_TABLET | Freq: Two times a day (BID) | ORAL | 0 refills | Status: DC

## 2020-07-29 MED ORDER — DEXAMETHASONE SODIUM PHOSPHATE 10 MG/ML IJ SOLN
10.0000 mg | Freq: Once | INTRAMUSCULAR | Status: AC
  Administered 2020-07-29: 10 mg via INTRAMUSCULAR

## 2020-07-29 NOTE — Discharge Instructions (Addendum)
Unable to rule out blood clot in urgent care setting.  Offered patient further evaluation and management in the ED.  Patient declines at this time and would like to try outpatient therapy first.  Aware of the risk associated with this decision including missed diagnosis, organ damage, organ failure, and/or death.  Patient aware and in agreement.     Steroid shot given in office COVID testing ordered.  It will take between 5-7 days for test results.  Someone will contact you regarding abnormal results.    In the meantime: You should remain isolated in your home for 10 days from symptom onset AND greater than 72 hours after symptoms resolution (absence of fever without the use of fever-reducing medication and improvement in respiratory symptoms), whichever is longer Get plenty of rest and push fluids Tessalon Perles prescribed for cough Prednisone prescribed Augmentin prescribed Use OTC zyrtec for nasal congestion, runny nose, and/or sore throat Use OTC flonase for nasal congestion and runny nose Use medications daily for symptom relief Use OTC medications like ibuprofen or tylenol as needed fever or pain Follow up with PCP for recheck Call or go to the ED if you have any new or worsening symptoms such as fever, worsening cough, shortness of breath, chest tightness, chest pain, turning blue, changes in mental status, etc..Marland Kitchen

## 2020-07-29 NOTE — ED Triage Notes (Signed)
Pt has hx of COPD and lung cancer. Pt is on 2L O2 at home at night. Pt said x 1 week she has been getting more short winded and having congestion in her nose and chest.  pt said coughing and feeling feverish. Pt was short winded upon her walking to the room O2 was 89% but once she settled down o2 was at 94 to 95%.

## 2020-07-29 NOTE — ED Provider Notes (Signed)
[place]   [id number] 07/29/20 Arrival Time: 7124   CC: Cough  SUBJECTIVE: History from: patient.  [name] is a 79 y.o. female who presents with nasal and chest congestion and feeling "short winded" x 1 week.  Hx significant for lung cancer and COPD.  On 2 L of oxygen at home.  Denies sick exposure to COVID, flu or strep.  Has tried OTC medications without relief.  Symptoms are made worse at night.  Reports previous symptoms in the past. Has been vaccinated  Denies chest pain, nausea, changes in bowel or bladder habits.    ROS: As per HPI.  All other pertinent ROS negative.     Past Medical History:  Diagnosis Date  . ADD (attention deficit disorder)   . Allergic rhinitis   . Anemia   . Anxiety   . Arrhythmia   . Arthritis    "hands, neck, right shoulder" (07/28/2015)  . Aseptic necrosis ([place])   . Asthma dxc'd 05/2015  . Basal cell cancer   . Bleeding stomach ulcer ~ 06/2005  . COPD (chronic obstructive pulmonary disease) ([place])   . Depression   . Diverticula of colon   . Dyspnea   . Esophagitis   . Gastric ulcer with hemorrhage   . GERD (gastroesophageal reflux disease)   . Heart murmur   . History of blood transfusion 06/2005   "related to pneumonia"  . History of kidney stones   . Hyperlipidemia   . Hypertension   . Hypothyroidism   . Kidney stones   . Lung cancer ([place])    Non small cell lung cancer, adenocarcinoma with BAC 09/2009, VATS converted to wedge resection of left upper lobe mass  . Mitral valve prolapse    "no ORs" (07/28/2015)  . Osteopenia   . Pneumonia 1970's; 06/2005  . Sleep apnea dx'd 06/2015   on oxygen at nite at 2L/[place]   . Squamous cell skin cancer    Past Surgical History:  Procedure Laterality Date  . ABDOMINAL HYSTERECTOMY  1988  . COLONOSCOPY WITH PROPOFOL N/A 06/15/2017   Procedure: COLONOSCOPY WITH PROPOFOL;  Surgeon: Carol Ada, MD;  Location: [facility];  Service: Endoscopy;  Laterality: N/A;  .  HAMMER TOE SURGERY Right ~ 2010  . SALPINGOOPHORECTOMY Left 1988  . SKIN CANCER EXCISION     "I've had severl cut off RLE; left foreqrm, nose under nose"  . THORACOTOMY Left 06/2005  . THORACOTOMY/LOBECTOMY  09/2009; 11/2009   left; right   Allergies  Allergen Reactions  . Doxycycline Nausea Only  . Erythromycin Nausea Only  . Sulfonamide Derivatives Hives   No current facility-administered medications on file prior to encounter.   Current Outpatient Medications on File Prior to Encounter  Medication Sig Dispense Refill  . acetaminophen (TYLENOL) 325 MG tablet Take by mouth.    Marland Kitchen albuterol (PROAIR HFA) 108 (90 Base) MCG/ACT inhaler Inhale 1 puff into the lungs every 6 (six) hours as needed for wheezing or shortness of breath.    Marland Kitchen albuterol (PROVENTIL) (2.5 MG/3ML) 0.083% nebulizer solution USE 1 VIAL (3 ML OR 2.5 MG TOTAL) VIA NEBULIZER EVERY 4 HOURS AS NEEDED FOR WHEEZING OR SHORTNESS OF BREATH 450 mL 3  . ALPRAZolam (XANAX) 0.5 MG tablet Take 1 tablet (0.5 mg total) by mouth 3 (three) times daily as needed. 30 tablet 0  . amphetamine-dextroamphetamine (ADDERALL XR) 15 MG 24 hr capsule Take 1 capsule by mouth every morning. 90 capsule 0  . buPROPion (WELLBUTRIN XL) 150  MG 24 hr tablet TAKE 1 TABLET DAILY 90 tablet 3  . Calcium Carbonate-Vit D-Min (CALCIUM 1200 PO) Take by mouth.    . Cyanocobalamin 1000 MCG CAPS Take by mouth.    . doxazosin (CARDURA) 4 MG tablet TAKE 1 TABLET DAILY 90 tablet 3  . DULoxetine (CYMBALTA) 60 MG capsule TAKE 1 CAPSULE DAILY 90 capsule 3  . fluticasone (FLONASE) 50 MCG/ACT nasal spray USE 2 SPRAYS IN EACH NOSTRIL DAILY 48 g 3  . folic acid (FOLVITE) 1 MG tablet Take by mouth.    . hydrOXYzine (ATARAX/VISTARIL) 25 MG tablet Take 1 tablet (25 mg total) by mouth 3 (three) times daily as needed for itching (sleep). 30 tablet 0  . LEVOXYL 112 MCG tablet TAKE 1 TABLET DAILY (DISCONTINUE LEVOTHYROXINE 100) 90 tablet 3  . losartan-hydrochlorothiazide (HYZAAR)  100-25 MG tablet TAKE 1 TABLET DAILY 90 tablet 3  . mometasone (ELOCON) 0.1 % cream Apply 1 application topically daily. 45 g 0  . Multiple Vitamins-Minerals (EMERGEN-C FIVE PO) Take by mouth.    Marland Kitchen omeprazole (PRILOSEC) 20 MG capsule Take 1 capsule (20 mg total) by mouth 2 (two) times daily before a meal. 90 capsule 3  . OXYGEN 2lpm with sleep    . pravastatin (PRAVACHOL) 40 MG tablet TAKE 1 TABLET DAILY 90 tablet 3  . thiamine (VITAMIN B-1) 100 MG tablet Take 100 mg by mouth daily.    . traZODone (DESYREL) 50 MG tablet TAKE 1 TABLET AT BEDTIME AS NEEDED FOR SLEEP 90 tablet 3  . TRELEGY ELLIPTA 100-62.5-25 MCG/INH AEPB USE 1 INHALATION DAILY (DISCONTINUE BEVESPI) 180 each 3  . triamcinolone cream (KENALOG) 0.1 % Apply 1 application topically 2 (two) times daily. 453 g 0  . VITAMIN D, CHOLECALCIFEROL, PO Take 1 tablet by mouth daily.    Marland Kitchen vortioxetine HBr (TRINTELLIX) 10 MG TABS tablet Take 1 tablet (10 mg total) by mouth daily. 30 tablet 2   Social History   Socioeconomic History  . Marital status: Married    Spouse name: Not on file  . Number of children: 3  . Years of education: Not on file  . Highest education level: Not on file  Occupational History  . Occupation: retired Optician, dispensing: RETIRED  Tobacco Use  . Smoking status: Former Smoker    Packs/day: 0.75    Years: 30.00    Pack years: 22.50    Types: Cigarettes    Quit date: 09/11/2004    Years since quitting: 15.8  . Smokeless tobacco: Never Used  Vaping Use  . Vaping Use: Never used  Substance and Sexual Activity  . Alcohol use: Yes    Alcohol/week: 7.0 standard drinks    Types: 7 Shots of liquor per week    Comment: 1-2 drinks nightly   . Drug use: No  . Sexual activity: Yes  Other Topics Concern  . Not on file  Social History Narrative  . Not on file   Social Determinants of Health   Financial Resource Strain:   . Difficulty of Paying Living Expenses: Not on file  Food Insecurity:   . Worried About  Charity fundraiser in the Last Year: Not on file  . Ran Out of Food in the Last Year: Not on file  Transportation Needs:   . Lack of Transportation (Medical): Not on file  . Lack of Transportation (Non-Medical): Not on file  Physical Activity:   . Days of Exercise per Week: Not on file  .  Minutes of Exercise per Session: Not on file  Stress:   . Feeling of Stress : Not on file  Social Connections:   . Frequency of Communication with Friends and Family: Not on file  . Frequency of Social Gatherings with Friends and Family: Not on file  . Attends Religious Services: Not on file  . Active Member of Clubs or Organizations: Not on file  . Attends Archivist Meetings: Not on file  . Marital Status: Not on file  Intimate Partner Violence:   . Fear of Current or Ex-Partner: Not on file  . Emotionally Abused: Not on file  . Physically Abused: Not on file  . Sexually Abused: Not on file   Family History  Problem Relation Age of Onset  . Graves' disease Daughter   . Breast cancer Maternal Aunt   . Heart disease Maternal Aunt   . Hypertension Mother   . Arthritis Maternal Grandmother     OBJECTIVE:  Vitals:   07/29/20 1117  BP: 126/74  Pulse: 92  Resp: (!) 24  Temp: 98.4 F (36.9 C)  TempSrc: Oral  SpO2: 94%     General appearance: alert; appears fatigued, but nontoxic; speaking in full sentences and tolerating own secretions HEENT: NCAT; Ears: EACs clear, TMs erythematous; Eyes: PERRL.  EOM grossly intact. Sinuses: TTP; Nose: nares patent without rhinorrhea, Throat: oropharynx clear, tonsils non erythematous or enlarged, uvula midline  Neck: supple without LAD Lungs: unlabored respirations, symmetrical air entry; cough: mild; no respiratory distress; CTAB Heart: regular rate and rhythm.   Skin: warm and dry Psychological: alert and cooperative; normal mood and affect  ASSESSMENT & PLAN:  1. Acute non-recurrent pansinusitis   2. COPD exacerbation ([place])   3.  Cough     Meds ordered this encounter  Medications  . predniSONE (STERAPRED UNI-PAK 21 TAB) 10 MG (21) TBPK tablet    Sig: Take by mouth daily. Take 6 tabs by mouth daily  for 2 days, then 5 tabs for 2 days, then 4 tabs for 2 days, then 3 tabs for 2 days, 2 tabs for 2 days, then 1 tab by mouth daily for 2 days    Dispense:  42 tablet    Refill:  0    Order Specific Question:   Supervising Provider    Answer:   Raylene Everts [[id number]]  . benzonatate (TESSALON) 100 MG capsule    Sig: Take 1 capsule (100 mg total) by mouth every 8 (eight) hours.    Dispense:  21 capsule    Refill:  0    Order Specific Question:   Supervising Provider    Answer:   Raylene Everts [[id number]]  . amoxicillin-clavulanate (AUGMENTIN) 875-125 MG tablet    Sig: Take 1 tablet by mouth every 12 (twelve) hours for 10 days.    Dispense:  20 tablet    Refill:  0    Order Specific Question:   Supervising Provider    Answer:   Raylene Everts [[id number]]  . dexamethasone (DECADRON) injection 10 mg   Unable to rule out blood clot in urgent care setting.  Offered patient further evaluation and management in the ED.  Patient declines at this time and would like to try outpatient therapy first.  Aware of the risk associated with this decision including missed diagnosis, organ damage, organ failure, and/or death.  Patient aware and in agreement.     Steroid shot given in office COVID testing ordered.  It will take between  5-7 days for test results.  Someone will contact you regarding abnormal results.    In the meantime: You should remain isolated in your home for 10 days from symptom onset AND greater than 72 hours after symptoms resolution (absence of fever without the use of fever-reducing medication and improvement in respiratory symptoms), whichever is longer Get plenty of rest and push fluids Tessalon Perles prescribed for cough Prednisone prescribed Augmentin prescribed Use OTC zyrtec for nasal  congestion, runny nose, and/or sore throat Use OTC flonase for nasal congestion and runny nose Use medications daily for symptom relief Use OTC medications like ibuprofen or tylenol as needed fever or pain Follow up with PCP for recheck Call or go to the ED if you have any new or worsening symptoms such as fever, worsening cough, shortness of breath, chest tightness, chest pain, turning blue, changes in mental status, etc...   Reviewed expectations re: course of current medical issues. Questions answered. Outlined signs and symptoms indicating need for more acute intervention. Patient verbalized understanding. After Visit Summary given.         Lestine Box, PA-C 07/29/20 1156

## 2020-07-30 ENCOUNTER — Ambulatory Visit: Payer: Medicare Other | Admitting: Family Medicine

## 2020-07-30 LAB — NOVEL CORONAVIRUS, NAA: SARS-CoV-2, NAA: NOT DETECTED

## 2020-07-30 LAB — SARS-COV-2, NAA 2 DAY TAT

## 2020-08-03 ENCOUNTER — Ambulatory Visit (INDEPENDENT_AMBULATORY_CARE_PROVIDER_SITE_OTHER): Payer: Medicare Other | Admitting: Family Medicine

## 2020-08-03 ENCOUNTER — Other Ambulatory Visit: Payer: Self-pay

## 2020-08-03 VITALS — BP 120/72 | HR 107 | Temp 98.6°F | Resp 19 | Ht 67.0 in | Wt 179.0 lb

## 2020-08-03 DIAGNOSIS — J019 Acute sinusitis, unspecified: Secondary | ICD-10-CM

## 2020-08-03 DIAGNOSIS — J441 Chronic obstructive pulmonary disease with (acute) exacerbation: Secondary | ICD-10-CM

## 2020-08-03 DIAGNOSIS — F330 Major depressive disorder, recurrent, mild: Secondary | ICD-10-CM

## 2020-08-03 DIAGNOSIS — F411 Generalized anxiety disorder: Secondary | ICD-10-CM

## 2020-08-03 DIAGNOSIS — F900 Attention-deficit hyperactivity disorder, predominantly inattentive type: Secondary | ICD-10-CM | POA: Diagnosis not present

## 2020-08-03 MED ORDER — LEVOFLOXACIN 500 MG PO TABS: 500.0000 mg | ORAL_TABLET | Freq: Every day | ORAL | 0 refills | Status: DC

## 2020-08-03 NOTE — Patient Instructions (Signed)
Take the levaquin once a day for 5 days Continue mucinex twice a day  F/U as needed with Dr Dennard Schaumann

## 2020-08-03 NOTE — Progress Notes (Signed)
Subjective:    Patient ID: [name], female    DOB: November 30, 1940, 79 y.o.   MRN: [id number]  Patient presents for Manic Behavior (pt reports doing things without knowing why, difficulty completing one task, changes in other normal behavior), Edema (pt struggling with edema in her legs was seen in urgent care states they didnt do anything ), and Cough (pt reports cough and some SOB O2 okay with oxygen tank lowest in mornings, pt reports productive coughing mucinex helps, shakey)  Patient here with her daughter.  She has multiple complaints.  She is a very complex patient with multiple comorbidities.  She has history of underlying COPD she is followed by pulmonary she is on oxygen 2 L at bedtime does wear during the day when she needs it.  The past week or so she has noted increased cough/sinus drainge  and some shortness of breath her O2 sats were below 90% at times during the day when she was not wearing her oxygen.  She was taking Mucinex that was not working as well.  She was using her albuterol however this is making her more shaky and on edge.  She seemed more confused and out of it in her typical.  She was seen at the urgent care and she was prescribed prednisone taper along with Augmentin.  She states Augmentin never works for her if she has any side effects with the antibiotic.  She is requesting to be changed to a new antibiotic.   She also noticed some peripheral edema but she was elevating her legs and this has gone down.  In the office she was very tangential which she often is.  I anxiety.  He is on multiple medications for stating that she needs to be on antidepressant which she was already taking.  Approximate 15 minutes spent discussing her medications.  States that she just feels on edge she has a lot of stress going on taking care of her husband and other family issues.  Her daughter is now expected to be a caregiver and helps oversee things for her and her  husband.  Review Of Systems:  GEN- denies fatigue, fever, weight loss,weakness, recent illness HEENT- denies eye drainage, change in vision, +nasal discharge, CVS- denies chest pain, palpitations RESP- + SOB,+ cough,+ wheeze ABD- denies N/V, change in stools, abd pain GU- denies dysuria, hematuria, dribbling, incontinence MSK- denies joint pain, muscle aches, injury Neuro- denies headache, dizziness, syncope, seizure activity       Objective:    BP 120/72   Pulse (!) 107   Temp 98.6 F (37 C) (Temporal)   Resp 19   Ht 5\' 7"  (1.702 m)   Wt 179 lb (81.2 kg)   SpO2 95%   BMI 28.04 kg/m  GEN- NAD, alert and oriented x3 HEENT- PERRL, EOMI, non injected sclera, pink conjunctiva, MMM, oropharynx clear, mild sinus pressure, TM clear, clear rhinorrhea  Neck- Supple, no thyromegaly CVS- RR, mild tachycardia  no murmur RESP- normal WOB at rest, no wheeze  Psych anxious, not depressed, tangential with speech/topics, no apparent hallucinations  ABD-NABS,soft,NT,ND EXT- No edema Pulses- Radial, DP- 2+        Assessment & Plan:      Problem List Items Addressed This Visit      Unprioritized   ADD (attention deficit disorder)    ADD with high anxiety and MDD I recommend seeing psychiatrist she is reluctant but states she will try She is on multiple meds and has  been on various combinations No changes made today Definitely beneficial that her daughter is assisting with her care.      Anxiety state   COPD exacerbation ([facility])   MDD (major depressive disorder)    Other Visit Diagnoses    Acute rhinosinusitis    -  Primary   Treat with levaquin, continue mucinex and steroids, nebs as needed, oxygen during day if needed ,covid test neg    Relevant Medications   levofloxacin (LEVAQUIN) 500 MG tablet      Note: This dictation was prepared with Dragon dictation along with smaller phrase technology. Any transcriptional errors that result from this process are unintentional.

## 2020-08-04 ENCOUNTER — Ambulatory Visit: Payer: Medicare Other | Admitting: Family Medicine

## 2020-08-04 ENCOUNTER — Encounter: Payer: Self-pay | Admitting: Family Medicine

## 2020-08-04 NOTE — Assessment & Plan Note (Signed)
ADD with high anxiety and MDD I recommend seeing psychiatrist she is reluctant but states she will try She is on multiple meds and has been on various combinations No changes made today Definitely beneficial that her daughter is assisting with her care.

## 2020-08-10 ENCOUNTER — Ambulatory Visit (INDEPENDENT_AMBULATORY_CARE_PROVIDER_SITE_OTHER): Payer: Medicare Other | Admitting: Family Medicine

## 2020-08-10 ENCOUNTER — Other Ambulatory Visit: Payer: Self-pay | Admitting: Family Medicine

## 2020-08-10 ENCOUNTER — Other Ambulatory Visit: Payer: Self-pay

## 2020-08-10 VITALS — BP 104/70 | HR 81 | Temp 98.4°F | Ht 67.0 in | Wt 176.0 lb

## 2020-08-10 DIAGNOSIS — F3112 Bipolar disorder, current episode manic without psychotic features, moderate: Secondary | ICD-10-CM | POA: Diagnosis not present

## 2020-08-10 MED ORDER — ARIPIPRAZOLE 5 MG PO TABS: 5.0000 mg | ORAL_TABLET | Freq: Every day | ORAL | 1 refills | Status: DC

## 2020-08-10 NOTE — Progress Notes (Signed)
Subjective:    Patient ID: [name], female    DOB: 09-20-1940, 79 y.o.   MRN: [id number]  Depression        06/29/20 Patient reports that she feels depressed.  She has been on Cymbalta and bupropion for quite some time.  However for the last several months, she has lost hope.  She states that she has no desire.  She simply sits around her home and has no motivation to leave.  She has no motivation exercise.  All she does is sit in her chair and watch TV.  She is under tremendous stress caring for her husband who has MS as well as dementia.  As result of the stress and the depression she reports memory loss and trouble focusing.  She is having a hard time thinking of words that she wants to say.  She states that she is having trouble sleeping.  Therefore she is self-medicating.  She is taking 2 trazodone 50 mg tablets at night and 100 mg of Benadryl.  I immediately told the patient to stop doing this due to anticholinergic side effects.  Instead I want her to use Xanax 0.5 mg p.o. nightly until we have the depression under control.  She denies any manic thoughts.  She denies any delusions or hallucinations or suicidal ideation or homicidal ideation.  She does report anhedonia and depression and feeling sad.  At that time, my plan was: I believe the stress of caring for her husband with dementia, the loss of her son earlier this year, and her general declining health have all contributed to worsen her depression.  Discontinue Cymbalta as the patient has been on this for many years and start Trintellix 10 mg daily.  Recheck here in 3 weeks or sooner if worsening.  Stop using trazodone and Benadryl at night.  Explained the risk of anticholinergic side effects.  Instead temporarily use Xanax at night to help her self sleep.  See the patient back in 3 weeks.  If this is not working, could consider supplementing with [place] or Abilify.  07/15/20 Patient is not sure that she seen any benefit  since switching from Cymbalta to Trintellix.  However she continues to have trouble sleeping at night.  She states that she wakes up about every day at 3 AM unable to sleep more.  She also reports itching all over her body.  She is scratching and clawing at the skin of the dorsums of her forearms as well as on her anterior shins bilaterally.  There is no visible rash.  There is no erythema.  There is no excoriations.  There is no explanation for the itching on visual inspection of the skin.  There is no evidence of jaundice.  She does have a history of polycythemia and she states that the itching feels similar to when her blood counts would rise in the past.  At that time, my plan was: Physical exam is normal.  Check CBC to rule out polycythemia.  Check CMP to check for kidney or liver abnormalities that can cause itching.  If normal, I question if this could be from reducing her trazodone and Benadryl, stopping her Cymbalta, starting Trintellix.  The other possibility would be "neurodermatitis/neuropathic itching".  Try hydroxyzine 25 mg every 8 hours as needed for itching or for insomnia to see if this helps and await the results of the lab test.  If the depression is not better in 2 weeks and the itching is not  better, I would recommend stopping Trintellix and resuming Cymbalta but then possibly adding Rexulti to Cymbalta.  08/10/20 Patient recently saw my partner and was clearly manic.  She presents today with her daughter.  She states that since I last saw her, she ordered a $13,000 remodel of her bathroom for no particular reason.  Her daughter also states that she was trying to order solar panels for her home.  This is out of character for the patient.  She was spending money unnecessarily and without good reason.  She also reports trouble sleeping.  The patient was taking trazodone, Benadryl, and Xanax independently without my direction trying to help find a way to go to sleep.  She reports racing  thoughts in her mind wandering at night.  She was also on prednisone at the same time for a sinus infection in addition to taking Adderall for ADD.  She has since abruptly stopped Trintellix.  Although she recently started Trintellix she is also been on Effexor for many years prior so I am concerned about serotonin withdrawal.  She is here today for follow-up Past Medical History:  Diagnosis Date  . ADD (attention deficit disorder)   . Allergic rhinitis   . Anemia   . Anxiety   . Arrhythmia   . Arthritis    "hands, neck, right shoulder" (07/28/2015)  . Aseptic necrosis ([place])   . Asthma dxc'd 05/2015  . Basal cell cancer   . Bleeding stomach ulcer ~ 06/2005  . COPD (chronic obstructive pulmonary disease) ([place])   . Depression   . Diverticula of colon   . Dyspnea   . Esophagitis   . Gastric ulcer with hemorrhage   . GERD (gastroesophageal reflux disease)   . Heart murmur   . History of blood transfusion 06/2005   "related to pneumonia"  . History of kidney stones   . Hyperlipidemia   . Hypertension   . Hypothyroidism   . Kidney stones   . Lung cancer ([place])    Non small cell lung cancer, adenocarcinoma with BAC 09/2009, VATS converted to wedge resection of left upper lobe mass  . Mitral valve prolapse    "no ORs" (07/28/2015)  . Osteopenia   . Pneumonia 1970's; 06/2005  . Sleep apnea dx'd 06/2015   on oxygen at nite at 2L/[place]   . Squamous cell skin cancer    Past Surgical History:  Procedure Laterality Date  . ABDOMINAL HYSTERECTOMY  1988  . COLONOSCOPY WITH PROPOFOL N/A 06/15/2017   Procedure: COLONOSCOPY WITH PROPOFOL;  Surgeon: Carol Ada, MD;  Location: [facility];  Service: Endoscopy;  Laterality: N/A;  . HAMMER TOE SURGERY Right ~ 2010  . SALPINGOOPHORECTOMY Left 1988  . SKIN CANCER EXCISION     "I've had severl cut off RLE; left foreqrm, nose under nose"  . THORACOTOMY Left 06/2005  . THORACOTOMY/LOBECTOMY  09/2009; 11/2009   left; right   Current Outpatient  Medications on File Prior to Visit  Medication Sig Dispense Refill  . acetaminophen (TYLENOL) 325 MG tablet Take by mouth.    Marland Kitchen albuterol (PROAIR HFA) 108 (90 Base) MCG/ACT inhaler Inhale 1 puff into the lungs every 6 (six) hours as needed for wheezing or shortness of breath.    Marland Kitchen albuterol (PROVENTIL) (2.5 MG/3ML) 0.083% nebulizer solution USE 1 VIAL (3 ML OR 2.5 MG TOTAL) VIA NEBULIZER EVERY 4 HOURS AS NEEDED FOR WHEEZING OR SHORTNESS OF BREATH 450 mL 3  . buPROPion (WELLBUTRIN XL) 150 MG 24 hr tablet TAKE 1  TABLET DAILY 90 tablet 3  . Calcium Carbonate-Vit D-Min (CALCIUM 1200 PO) Take 50 mg by mouth. ONCE A WEEK    . co-enzyme Q-10 30 MG capsule Take 100 mg by mouth 3 (three) times daily.    . Cyanocobalamin 1000 MCG CAPS Take by mouth.    . LEVOXYL 112 MCG tablet TAKE 1 TABLET DAILY (DISCONTINUE LEVOTHYROXINE 100) 90 tablet 3  . losartan-hydrochlorothiazide (HYZAAR) 100-25 MG tablet TAKE 1 TABLET DAILY 90 tablet 3  . Multiple Vitamins-Minerals (EMERGEN-C FIVE PO) Take by mouth.    Marland Kitchen omeprazole (PRILOSEC) 20 MG capsule Take 1 capsule (20 mg total) by mouth 2 (two) times daily before a meal. 90 capsule 3  . OXYGEN 2lpm with sleep    . pravastatin (PRAVACHOL) 40 MG tablet TAKE 1 TABLET DAILY 90 tablet 3  . thiamine (VITAMIN B-1) 100 MG tablet Take 100 mg by mouth daily.    . TRELEGY ELLIPTA 100-62.5-25 MCG/INH AEPB USE 1 INHALATION DAILY (DISCONTINUE BEVESPI) 180 each 3  . triamcinolone cream (KENALOG) 0.1 % Apply 1 application topically 2 (two) times daily. 453 g 0  . VITAMIN D, CHOLECALCIFEROL, PO Take 1 tablet by mouth daily.    Marland Kitchen ALPRAZolam (XANAX) 0.5 MG tablet Take 1 tablet (0.5 mg total) by mouth 3 (three) times daily as needed. (Patient not taking: Reported on 08/10/2020) 30 tablet 0  . doxazosin (CARDURA) 4 MG tablet TAKE 1 TABLET DAILY 90 tablet 3  . fluticasone (FLONASE) 50 MCG/ACT nasal spray USE 2 SPRAYS IN EACH NOSTRIL DAILY 48 g 3   No current facility-administered medications  on file prior to visit.    Allergies  Allergen Reactions  . Amoxicillin     Diarrhea, doesnt tolerate   . Doxycycline Nausea Only  . Erythromycin Nausea Only  . Sulfonamide Derivatives Hives   Social History   Socioeconomic History  . Marital status: Married    Spouse name: Not on file  . Number of children: 3  . Years of education: Not on file  . Highest education level: Not on file  Occupational History  . Occupation: retired Optician, dispensing: RETIRED  Tobacco Use  . Smoking status: Former Smoker    Packs/day: 0.75    Years: 30.00    Pack years: 22.50    Types: Cigarettes    Quit date: 09/11/2004    Years since quitting: 15.9  . Smokeless tobacco: Never Used  Vaping Use  . Vaping Use: Never used  Substance and Sexual Activity  . Alcohol use: Yes    Alcohol/week: 7.0 standard drinks    Types: 7 Shots of liquor per week    Comment: 1-2 drinks nightly   . Drug use: No  . Sexual activity: Yes  Other Topics Concern  . Not on file  Social History Narrative  . Not on file   Social Determinants of Health   Financial Resource Strain:   . Difficulty of Paying Living Expenses: Not on file  Food Insecurity:   . Worried About Charity fundraiser in the Last Year: Not on file  . Ran Out of Food in the Last Year: Not on file  Transportation Needs:   . Lack of Transportation (Medical): Not on file  . Lack of Transportation (Non-Medical): Not on file  Physical Activity:   . Days of Exercise per Week: Not on file  . Minutes of Exercise per Session: Not on file  Stress:   . Feeling of Stress : Not on  file  Social Connections:   . Frequency of Communication with Friends and Family: Not on file  . Frequency of Social Gatherings with Friends and Family: Not on file  . Attends Religious Services: Not on file  . Active Member of Clubs or Organizations: Not on file  . Attends Archivist Meetings: Not on file  . Marital Status: Not on file  Intimate Partner  Violence:   . Fear of Current or Ex-Partner: Not on file  . Emotionally Abused: Not on file  . Physically Abused: Not on file  . Sexually Abused: Not on file      Review of Systems  Psychiatric/Behavioral: Positive for depression.  All other systems reviewed and are negative.      Objective:   Physical Exam Vitals reviewed.  Constitutional:      General: She is not in acute distress.    Appearance: Normal appearance. She is normal weight. She is not ill-appearing or toxic-appearing.  Cardiovascular:     Rate and Rhythm: Normal rate and regular rhythm.  Pulmonary:     Effort: Pulmonary effort is normal.     Breath sounds: Normal breath sounds.  Neurological:     Mental Status: She is alert.           Assessment & Plan:  Bipolar 1 disorder, manic, moderate ([place])  Although she appears much calmer today, I believe that the patient's symptoms now classify mania.  Therefore I believe that she has bipolar if she is clearly had depression in the past.  Therefore we made several changes today.  First we will stop Adderall.  I do not believe that a stimulant is beneficial to a person having insomnia with manic symptoms.  Her daughter will remove this medication from her box.  Second I do not want her to abruptly discontinue Trintellix.  I am concerned about serotonin withdrawal.  Therefore I have asked her to decrease Trintellix gradually 1st-5 milligrams every other day for a week and then 5 mg every third day for a week and then stop.  She will wean herself off of this medication.  Third I recommended that she stop abusing alcohol.  Apparently she has been drinking an excessive amount of wine in an attempt to try to go to sleep.  I believe that this is only adding to the problem.  I also do not want her taking medications independently at home.  Therefore I recommended that she stop all Benadryl, trazodone, hydroxyzine etc.  Instead I feel that we need to focus on treating bipolar  mania.  Therefore I will start her on Abilify 5 mg daily and reassess the patient in 1 week.  She is still on bupropion.  We may want to reassess stopping that medication in the future but I hesitate to make too many changes at one time.  I will also consult psychiatry as soon as possible

## 2020-08-16 ENCOUNTER — Ambulatory Visit (INDEPENDENT_AMBULATORY_CARE_PROVIDER_SITE_OTHER): Payer: Medicare Other | Admitting: Family Medicine

## 2020-08-16 ENCOUNTER — Other Ambulatory Visit: Payer: Self-pay

## 2020-08-16 VITALS — BP 130/74 | HR 78 | Temp 97.8°F | Resp 16 | Ht 67.0 in | Wt 175.0 lb

## 2020-08-16 DIAGNOSIS — F3112 Bipolar disorder, current episode manic without psychotic features, moderate: Secondary | ICD-10-CM | POA: Diagnosis not present

## 2020-08-16 MED ORDER — CLONAZEPAM 0.5 MG PO TABS: 0.5000 mg | ORAL_TABLET | Freq: Every evening | ORAL | 1 refills | Status: DC | PRN

## 2020-08-16 NOTE — Progress Notes (Signed)
Subjective:    Patient ID: [name], female    DOB: 1940-11-17, 79 y.o.   MRN: [id number]  Depression        06/29/20 Patient reports that she feels depressed.  She has been on Cymbalta and bupropion for quite some time.  However for the last several months, she has lost hope.  She states that she has no desire.  She simply sits around her home and has no motivation to leave.  She has no motivation exercise.  All she does is sit in her chair and watch TV.  She is under tremendous stress caring for her husband who has MS as well as dementia.  As result of the stress and the depression she reports memory loss and trouble focusing.  She is having a hard time thinking of words that she wants to say.  She states that she is having trouble sleeping.  Therefore she is self-medicating.  She is taking 2 trazodone 50 mg tablets at night and 100 mg of Benadryl.  I immediately told the patient to stop doing this due to anticholinergic side effects.  Instead I want her to use Xanax 0.5 mg p.o. nightly until we have the depression under control.  She denies any manic thoughts.  She denies any delusions or hallucinations or suicidal ideation or homicidal ideation.  She does report anhedonia and depression and feeling sad.  At that time, my plan was: I believe the stress of caring for her husband with dementia, the loss of her son earlier this year, and her general declining health have all contributed to worsen her depression.  Discontinue Cymbalta as the patient has been on this for many years and start Trintellix 10 mg daily.  Recheck here in 3 weeks or sooner if worsening.  Stop using trazodone and Benadryl at night.  Explained the risk of anticholinergic side effects.  Instead temporarily use Xanax at night to help her self sleep.  See the patient back in 3 weeks.  If this is not working, could consider supplementing with [place] or Abilify.  07/15/20 Patient is not sure that she seen any benefit  since switching from Cymbalta to Trintellix.  However she continues to have trouble sleeping at night.  She states that she wakes up about every day at 3 AM unable to sleep more.  She also reports itching all over her body.  She is scratching and clawing at the skin of the dorsums of her forearms as well as on her anterior shins bilaterally.  There is no visible rash.  There is no erythema.  There is no excoriations.  There is no explanation for the itching on visual inspection of the skin.  There is no evidence of jaundice.  She does have a history of polycythemia and she states that the itching feels similar to when her blood counts would rise in the past.  At that time, my plan was: Physical exam is normal.  Check CBC to rule out polycythemia.  Check CMP to check for kidney or liver abnormalities that can cause itching.  If normal, I question if this could be from reducing her trazodone and Benadryl, stopping her Cymbalta, starting Trintellix.  The other possibility would be "neurodermatitis/neuropathic itching".  Try hydroxyzine 25 mg every 8 hours as needed for itching or for insomnia to see if this helps and await the results of the lab test.  If the depression is not better in 2 weeks and the itching is not  better, I would recommend stopping Trintellix and resuming Cymbalta but then possibly adding Rexulti to Cymbalta.  08/10/20 Patient recently saw my partner and was clearly manic.  She presents today with her daughter.  She states that since I last saw her, she ordered a $13,000 remodel of her bathroom for no particular reason.  Her daughter also states that she was trying to order solar panels for her home.  This is out of character for the patient.  She was spending money unnecessarily and without good reason.  She also reports trouble sleeping.  The patient was taking trazodone, Benadryl, and Xanax independently without my direction trying to help find a way to go to sleep.  She reports racing  thoughts in her mind wandering at night.  She was also on prednisone at the same time for a sinus infection in addition to taking Adderall for ADD.  She has since abruptly stopped Trintellix.  Although she recently started Trintellix she is also been on Effexor for many years prior so I am concerned about serotonin withdrawal.  She is here today for follow-up.  At that time, my plan was: Although she appears much calmer today, I believe that the patient's symptoms now classify mania.  Therefore I believe that she has bipolar if she is clearly had depression in the past.  Therefore we made several changes today.  First we will stop Adderall.  I do not believe that a stimulant is beneficial to a person having insomnia with manic symptoms.  Her daughter will remove this medication from her box.  Second I do not want her to abruptly discontinue Trintellix.  I am concerned about serotonin withdrawal.  Therefore I have asked her to decrease Trintellix gradually 1st-5 milligrams every other day for a week and then 5 mg every third day for a week and then stop.  She will wean herself off of this medication.  Third I recommended that she stop abusing alcohol.  Apparently she has been drinking an excessive amount of wine in an attempt to try to go to sleep.  I believe that this is only adding to the problem.  I also do not want her taking medications independently at home.  Therefore I recommended that she stop all Benadryl, trazodone, hydroxyzine etc.  Instead I feel that we need to focus on treating bipolar mania.  Therefore I will start her on Abilify 5 mg daily and reassess the patient in 1 week.  She is still on bupropion.  We may want to reassess stopping that medication in the future but I hesitate to make too many changes at one time.  I will also consult psychiatry as soon as possible  08/16/20 Patient seems to be much calmer today.  Her thought process is easy to follow and much more logical.  There is no  tangential speech or pressured speech.  She reports improved depression and improved anxiety however she continues to have difficulty sleeping.  She states that she will lay down at night and read in bed however she will simply toss and turn unable to sleep.  She is taking 1-2 Xanax in the evening with no benefit.  She is now off her stimulant.  She is weaning off Trintellix. Past Medical History:  Diagnosis Date  . ADD (attention deficit disorder)   . Allergic rhinitis   . Anemia   . Anxiety   . Arrhythmia   . Arthritis    "hands, neck, right shoulder" (07/28/2015)  . Aseptic  necrosis ([place])   . Asthma dxc'd 05/2015  . Basal cell cancer   . Bleeding stomach ulcer ~ 06/2005  . COPD (chronic obstructive pulmonary disease) ([place])   . Depression   . Diverticula of colon   . Dyspnea   . Esophagitis   . Gastric ulcer with hemorrhage   . GERD (gastroesophageal reflux disease)   . Heart murmur   . History of blood transfusion 06/2005   "related to pneumonia"  . History of kidney stones   . Hyperlipidemia   . Hypertension   . Hypothyroidism   . Kidney stones   . Lung cancer ([place])    Non small cell lung cancer, adenocarcinoma with BAC 09/2009, VATS converted to wedge resection of left upper lobe mass  . Mitral valve prolapse    "no ORs" (07/28/2015)  . Osteopenia   . Pneumonia 1970's; 06/2005  . Sleep apnea dx'd 06/2015   on oxygen at nite at 2L/[place]   . Squamous cell skin cancer    Past Surgical History:  Procedure Laterality Date  . ABDOMINAL HYSTERECTOMY  1988  . COLONOSCOPY WITH PROPOFOL N/A 06/15/2017   Procedure: COLONOSCOPY WITH PROPOFOL;  Surgeon: Carol Ada, MD;  Location: [facility];  Service: Endoscopy;  Laterality: N/A;  . HAMMER TOE SURGERY Right ~ 2010  . SALPINGOOPHORECTOMY Left 1988  . SKIN CANCER EXCISION     "I've had severl cut off RLE; left foreqrm, nose under nose"  . THORACOTOMY Left 06/2005  . THORACOTOMY/LOBECTOMY  09/2009; 11/2009   left; right    Current Outpatient Medications on File Prior to Visit  Medication Sig Dispense Refill  . acetaminophen (TYLENOL) 325 MG tablet Take by mouth.    Marland Kitchen albuterol (PROAIR HFA) 108 (90 Base) MCG/ACT inhaler Inhale 1 puff into the lungs every 6 (six) hours as needed for wheezing or shortness of breath.    Marland Kitchen albuterol (PROVENTIL) (2.5 MG/3ML) 0.083% nebulizer solution USE 1 VIAL (3 ML OR 2.5 MG TOTAL) VIA NEBULIZER EVERY 4 HOURS AS NEEDED FOR WHEEZING OR SHORTNESS OF BREATH 450 mL 3  . ALPRAZolam (XANAX) 0.5 MG tablet Take 1 tablet (0.5 mg total) by mouth 3 (three) times daily as needed. 30 tablet 0  . ARIPiprazole (ABILIFY) 5 MG tablet TAKE 1 TABLET(5 MG) BY MOUTH DAILY 90 tablet 3  . buPROPion (WELLBUTRIN XL) 150 MG 24 hr tablet TAKE 1 TABLET DAILY 90 tablet 3  . Calcium Carbonate-Vit D-Min (CALCIUM 1200 PO) Take 50 mg by mouth. ONCE A WEEK    . co-enzyme Q-10 30 MG capsule Take 100 mg by mouth 3 (three) times daily.    . Cyanocobalamin 1000 MCG CAPS Take by mouth.    . doxazosin (CARDURA) 4 MG tablet TAKE 1 TABLET DAILY 90 tablet 3  . fluticasone (FLONASE) 50 MCG/ACT nasal spray USE 2 SPRAYS IN EACH NOSTRIL DAILY 48 g 3  . LEVOXYL 112 MCG tablet TAKE 1 TABLET DAILY (DISCONTINUE LEVOTHYROXINE 100) 90 tablet 3  . losartan-hydrochlorothiazide (HYZAAR) 100-25 MG tablet TAKE 1 TABLET DAILY 90 tablet 3  . Multiple Vitamins-Minerals (EMERGEN-C FIVE PO) Take by mouth.    Marland Kitchen omeprazole (PRILOSEC) 20 MG capsule Take 1 capsule (20 mg total) by mouth 2 (two) times daily before a meal. 90 capsule 3  . OXYGEN 2lpm with sleep    . pravastatin (PRAVACHOL) 40 MG tablet TAKE 1 TABLET DAILY 90 tablet 3  . thiamine (VITAMIN B-1) 100 MG tablet Take 100 mg by mouth daily.    Viviana Simpler  ELLIPTA 100-62.5-25 MCG/INH AEPB USE 1 INHALATION DAILY (DISCONTINUE BEVESPI) 180 each 3  . triamcinolone cream (KENALOG) 0.1 % Apply 1 application topically 2 (two) times daily. 453 g 0  . VITAMIN D, CHOLECALCIFEROL, PO Take 1 tablet by  mouth daily.     No current facility-administered medications on file prior to visit.    Allergies  Allergen Reactions  . Amoxicillin     Diarrhea, doesnt tolerate   . Doxycycline Nausea Only  . Erythromycin Nausea Only  . Sulfonamide Derivatives Hives   Social History   Socioeconomic History  . Marital status: Married    Spouse name: Not on file  . Number of children: 3  . Years of education: Not on file  . Highest education level: Not on file  Occupational History  . Occupation: retired Optician, dispensing: RETIRED  Tobacco Use  . Smoking status: Former Smoker    Packs/day: 0.75    Years: 30.00    Pack years: 22.50    Types: Cigarettes    Quit date: 09/11/2004    Years since quitting: 15.9  . Smokeless tobacco: Never Used  Vaping Use  . Vaping Use: Never used  Substance and Sexual Activity  . Alcohol use: Yes    Alcohol/week: 7.0 standard drinks    Types: 7 Shots of liquor per week    Comment: 1-2 drinks nightly   . Drug use: No  . Sexual activity: Yes  Other Topics Concern  . Not on file  Social History Narrative  . Not on file   Social Determinants of Health   Financial Resource Strain:   . Difficulty of Paying Living Expenses: Not on file  Food Insecurity:   . Worried About Charity fundraiser in the Last Year: Not on file  . Ran Out of Food in the Last Year: Not on file  Transportation Needs:   . Lack of Transportation (Medical): Not on file  . Lack of Transportation (Non-Medical): Not on file  Physical Activity:   . Days of Exercise per Week: Not on file  . Minutes of Exercise per Session: Not on file  Stress:   . Feeling of Stress : Not on file  Social Connections:   . Frequency of Communication with Friends and Family: Not on file  . Frequency of Social Gatherings with Friends and Family: Not on file  . Attends Religious Services: Not on file  . Active Member of Clubs or Organizations: Not on file  . Attends Archivist Meetings: Not on  file  . Marital Status: Not on file  Intimate Partner Violence:   . Fear of Current or Ex-Partner: Not on file  . Emotionally Abused: Not on file  . Physically Abused: Not on file  . Sexually Abused: Not on file      Review of Systems  Psychiatric/Behavioral: Positive for depression.  All other systems reviewed and are negative.      Objective:   Physical Exam Vitals reviewed.  Constitutional:      General: She is not in acute distress.    Appearance: Normal appearance. She is normal weight. She is not ill-appearing or toxic-appearing.  Cardiovascular:     Rate and Rhythm: Normal rate and regular rhythm.  Pulmonary:     Effort: Pulmonary effort is normal.     Breath sounds: Normal breath sounds.  Neurological:     Mental Status: She is alert.           Assessment &  Plan:  Bipolar 1 disorder, manic, moderate ([place])  Patient's presentation seems much more stable today.  From the standpoint of train of thought, judgment, and impulsivity, the patient seems to be near her baseline.  Her anxiety and depression also seem to be better however she still not sleeping.  I will keep Abilify at his current dose 5 mg a day.  I will discontinue Xanax and switch to Klonopin 0.5 mg, 1 to 2 tablets at night as needed insomnia.

## 2020-08-20 ENCOUNTER — Other Ambulatory Visit: Payer: Self-pay

## 2020-08-23 DIAGNOSIS — H04123 Dry eye syndrome of bilateral lacrimal glands: Secondary | ICD-10-CM | POA: Diagnosis not present

## 2020-08-23 DIAGNOSIS — Z961 Presence of intraocular lens: Secondary | ICD-10-CM | POA: Diagnosis not present

## 2020-08-23 DIAGNOSIS — H524 Presbyopia: Secondary | ICD-10-CM | POA: Diagnosis not present

## 2020-08-23 DIAGNOSIS — H57052 Tonic pupil, left eye: Secondary | ICD-10-CM | POA: Diagnosis not present

## 2020-08-23 DIAGNOSIS — H40013 Open angle with borderline findings, low risk, bilateral: Secondary | ICD-10-CM | POA: Diagnosis not present

## 2020-08-24 ENCOUNTER — Other Ambulatory Visit: Payer: Self-pay

## 2020-08-24 MED ORDER — TRIAMCINOLONE ACETONIDE 0.1 % EX CREA
1.0000 | TOPICAL_CREAM | Freq: Two times a day (BID) | CUTANEOUS | 0 refills | Status: DC
Start: 2020-08-24 — End: 2021-11-08

## 2020-08-24 MED ORDER — ARIPIPRAZOLE 5 MG PO TABS
ORAL_TABLET | ORAL | 3 refills | Status: DC
Start: 2020-08-24 — End: 2020-09-06

## 2020-08-27 ENCOUNTER — Other Ambulatory Visit: Payer: Self-pay

## 2020-08-31 ENCOUNTER — Encounter: Payer: Self-pay | Admitting: Family Medicine

## 2020-09-06 ENCOUNTER — Other Ambulatory Visit: Payer: Self-pay

## 2020-09-06 ENCOUNTER — Other Ambulatory Visit: Payer: Self-pay | Admitting: Family Medicine

## 2020-09-06 MED ORDER — ARIPIPRAZOLE 10 MG PO TABS: 10.0000 mg | ORAL_TABLET | Freq: Every day | ORAL | 0 refills | Status: DC

## 2020-09-06 MED ORDER — ARIPIPRAZOLE 10 MG PO TABS
10.0000 mg | ORAL_TABLET | Freq: Every day | ORAL | 1 refills | Status: DC
Start: 2020-09-06 — End: 2020-11-08

## 2020-09-06 MED ORDER — CLONAZEPAM 0.5 MG PO TABS: 0.5000 mg | ORAL_TABLET | Freq: Every evening | ORAL | 1 refills | Status: DC | PRN

## 2020-09-17 ENCOUNTER — Other Ambulatory Visit: Payer: Self-pay

## 2020-09-17 ENCOUNTER — Encounter: Payer: Self-pay | Admitting: Nurse Practitioner

## 2020-09-17 ENCOUNTER — Ambulatory Visit (INDEPENDENT_AMBULATORY_CARE_PROVIDER_SITE_OTHER): Payer: Medicare Other | Admitting: Nurse Practitioner

## 2020-09-17 VITALS — BP 120/70 | Temp 97.2°F | Ht 67.0 in | Wt 180.8 lb

## 2020-09-17 DIAGNOSIS — R399 Unspecified symptoms and signs involving the genitourinary system: Secondary | ICD-10-CM | POA: Insufficient documentation

## 2020-09-17 LAB — URINALYSIS, ROUTINE W REFLEX MICROSCOPIC
Bilirubin Urine: NEGATIVE
Glucose, UA: NEGATIVE
Hgb urine dipstick: NEGATIVE
Ketones, ur: NEGATIVE
Leukocytes,Ua: NEGATIVE
Nitrite: NEGATIVE
Protein, ur: NEGATIVE
Specific Gravity, Urine: 1.025 (ref 1.001–1.03)
pH: 5.5 (ref 5.0–8.0)

## 2020-09-17 NOTE — Progress Notes (Signed)
Subjective:    Patient ID: [name], female    DOB: Mar 30, 1941, 80 y.o.   MRN: [id number]  HPI: [name] is a 80 y.o. female presenting for urinary symptoms.  Chief Complaint  Patient presents with  . Urinary Tract Infection   URINARY SYMPTOMS Duration: ~ 6 days Dysuria: no Urinary frequency: yes Urgency: yes Small volume voids: yes Symptom severity: moderate Urinary incontinence: yes; gets up 3-4 times per night; with recent increase in urgency Foul odor: smells stronger Hematuria: no Abdominal pain: no Back pain: not new; chronic back pain on left side Suprapubic pain/pressure: no Flank pain: no Fever:  No Nausea: no Vomiting: no Relief with cranberry juice: yes Relief with pyridium: not tried Status: better Previous urinary tract infection: yes Recurrent urinary tract infection: no Sexual activity: not currently History of sexually transmitted disease: no Vaginal discharge: no Treatments attempted: drinking more fluids; does drink plenty of water, drinks 2-3 cups of caffeine per day    Allergies  Allergen Reactions  . Amoxicillin     Diarrhea, doesnt tolerate   . Doxycycline Nausea Only  . Erythromycin Nausea Only  . Sulfonamide Derivatives Hives    Outpatient Encounter Medications as of 09/17/2020  Medication Sig Note  . acetaminophen (TYLENOL) 325 MG tablet Take by mouth. 07/28/2016: Received from: [facility]: Take 650 mg by mouth every 4 (four) hours as needed for Pain.  Marland Kitchen albuterol (PROAIR HFA) 108 (90 Base) MCG/ACT inhaler Inhale 1 puff into the lungs every 6 (six) hours as needed for wheezing or shortness of breath.   Marland Kitchen albuterol (PROVENTIL) (2.5 MG/3ML) 0.083% nebulizer solution USE 1 VIAL (3 ML OR 2.5 MG TOTAL) VIA NEBULIZER EVERY 4 HOURS AS NEEDED FOR WHEEZING OR SHORTNESS OF BREATH   . ARIPiprazole (ABILIFY) 10 MG tablet Take 1 tablet (10 mg total) by mouth daily.   Marland Kitchen buPROPion (WELLBUTRIN  XL) 150 MG 24 hr tablet TAKE 1 TABLET DAILY   . Calcium Carbonate-Vit D-Min (CALCIUM 1200 PO) Take 50 mg by mouth. ONCE A WEEK   . clonazePAM (KLONOPIN) 0.5 MG tablet Take 1 tablet (0.5 mg total) by mouth at bedtime as needed for anxiety.   Marland Kitchen co-enzyme Q-10 30 MG capsule Take 100 mg by mouth 3 (three) times daily.   . Cyanocobalamin 1000 MCG CAPS Take by mouth.   . doxazosin (CARDURA) 4 MG tablet TAKE 1 TABLET DAILY   . fluticasone (FLONASE) 50 MCG/ACT nasal spray USE 2 SPRAYS IN EACH NOSTRIL DAILY   . LEVOXYL 112 MCG tablet TAKE 1 TABLET DAILY (DISCONTINUE LEVOTHYROXINE 100)   . losartan-hydrochlorothiazide (HYZAAR) 100-25 MG tablet TAKE 1 TABLET DAILY   . Multiple Vitamins-Minerals (EMERGEN-C FIVE PO) Take by mouth.   Marland Kitchen omeprazole (PRILOSEC) 20 MG capsule Take 1 capsule (20 mg total) by mouth 2 (two) times daily before a meal. 05/05/2020: Once a day  . OXYGEN 2lpm with sleep   . pravastatin (PRAVACHOL) 40 MG tablet TAKE 1 TABLET DAILY   . thiamine (VITAMIN B-1) 100 MG tablet Take 100 mg by mouth daily.   . TRELEGY ELLIPTA 100-62.5-25 MCG/INH AEPB USE 1 INHALATION DAILY (DISCONTINUE BEVESPI)   . triamcinolone (KENALOG) 0.1 % Apply 1 application topically 2 (two) times daily.   Marland Kitchen VITAMIN D, CHOLECALCIFEROL, PO Take 1 tablet by mouth daily.    No facility-administered encounter medications on file as of 09/17/2020.    Patient Active Problem List   Diagnosis Date Noted  . Urinary  tract infection symptoms 09/17/2020  . Chronic respiratory failure with hypoxia (HCC)/ nocturnal hypoxemia complicated by polycythemia 07/01/2017  . Dyspnea on exertion 06/29/2017  . ADD (attention deficit disorder)   . Multiple pulmonary nodules 05/08/2017  . Palliative care encounter   . Tobacco abuse 07/29/2015  . Anxiety state 07/29/2015  . COPD exacerbation ([place]) 07/28/2015  . Acute respiratory failure with hypoxia ([place]) 07/28/2015  . Polycythemia 07/28/2015  . Nocturnal hypoxia 07/26/2015  . Encounter for  follow-up surveillance of lung cancer 09/29/2014  . Atypical chest pain 12/01/2013  . Other malaise and fatigue 02/15/2013  . Hypothyroidism 02/15/2013  . Stye 02/15/2013  . Neck pain 02/15/2013  . MDD (major depressive disorder) 03/26/2012  . Hypertension 03/26/2012  . Peptic ulcer disease 03/26/2012  . Lung cancer ([place]) 08/31/2011  . PREMATURE VENTRICULAR CONTRACTIONS 07/03/2007  . Allergic rhinitis due to allergen 07/03/2007  . COPD GOLD II 07/03/2007  . EMPYEMA 07/03/2007  . GASTROINTESTINAL HEMORRHAGE 07/03/2007  . MITRAL VALVE PROLAPSE, HX OF 07/03/2007  . OOPHORECTOMY, LEFT, HX OF 07/03/2007    Past Medical History:  Diagnosis Date  . ADD (attention deficit disorder)   . Allergic rhinitis   . Anemia   . Anxiety   . Arrhythmia   . Arthritis    "hands, neck, right shoulder" (07/28/2015)  . Aseptic necrosis ([place])   . Asthma dxc'd 05/2015  . Basal cell cancer   . Bleeding stomach ulcer ~ 06/2005  . COPD (chronic obstructive pulmonary disease) ([place])   . Depression   . Diverticula of colon   . Dyspnea   . Esophagitis   . Gastric ulcer with hemorrhage   . GERD (gastroesophageal reflux disease)   . Heart murmur   . History of blood transfusion 06/2005   "related to pneumonia"  . History of kidney stones   . Hyperlipidemia   . Hypertension   . Hypothyroidism   . Kidney stones   . Lung cancer ([place])    Non small cell lung cancer, adenocarcinoma with BAC 09/2009, VATS converted to wedge resection of left upper lobe mass  . Mitral valve prolapse    "no ORs" (07/28/2015)  . Osteopenia   . Pneumonia 1970's; 06/2005  . Sleep apnea dx'd 06/2015   on oxygen at nite at 2L/[place]   . Squamous cell skin cancer     Relevant past medical, surgical, family and social history reviewed and updated as indicated. Interim medical history since our last visit reviewed.  Review of Systems  Constitutional: Negative for activity change, appetite change, fatigue and fever.   Gastrointestinal: Negative.  Negative for abdominal pain.  Genitourinary: Positive for enuresis. Negative for decreased urine volume, dysuria, frequency, hematuria, urgency and vaginal discharge.  Musculoskeletal: Negative.  Negative for back pain.  Skin: Negative.   Neurological: Negative.   Psychiatric/Behavioral: Negative.    Per HPI unless specifically indicated above     Objective:    BP 120/70   Temp (!) 97.2 F (36.2 C)   Ht 5\' 7"  (1.702 m)   Wt 180 lb 12.8 oz (82 kg)   SpO2 95%   BMI 28.32 kg/m   Wt Readings from Last 3 Encounters:  09/17/20 180 lb 12.8 oz (82 kg)  08/16/20 175 lb (79.4 kg)  08/10/20 176 lb (79.8 kg)    Physical Exam Vitals and nursing note reviewed.  Constitutional:      General: She is not in acute distress.    Appearance: Normal appearance. She is not toxic-appearing.  Abdominal:  General: Abdomen is flat. There is no distension.     Palpations: Abdomen is soft. There is no mass.     Tenderness: There is no abdominal tenderness. There is no right CVA tenderness or left CVA tenderness.  Skin:    General: Skin is warm and dry.     Capillary Refill: Capillary refill takes less than 2 seconds.     Coloration: Skin is not jaundiced or pale.     Findings: No erythema.  Neurological:     Mental Status: She is alert and oriented to person, place, and time.  Psychiatric:        Mood and Affect: Mood normal.        Behavior: Behavior normal.        Thought Content: Thought content normal.        Judgment: Judgment normal.        Assessment & Plan:   Problem List Items Addressed This Visit      Other   Urinary tract infection symptoms - Primary    Acute, ongoing x days.  UA dipstick today clear.  Will send urine for culture and follow.  No red flags in history or on examination.  In meantime, increase hydration with water and try to avoid bladder irritants such as caffeine and spicy foods.  Question if component of OAB; reports seeing  Urologist in past for similar symptoms.  Okay to try OTC azo for symptoms.  If symptoms continue to persist, return to clinic.  With nausea/vomiting and unable to keep fluids down, go to ER.      Relevant Orders   Urinalysis, Routine w reflex microscopic (Completed)   Urine Culture       Follow up plan: Return if symptoms worsen or fail to improve.

## 2020-09-17 NOTE — Assessment & Plan Note (Addendum)
Acute, ongoing x days.  UA dipstick today clear.  Will send urine for culture and follow.  No red flags in history or on examination.  In meantime, increase hydration with water and try to avoid bladder irritants such as caffeine and spicy foods.  Question if component of OAB; reports seeing Urologist in past for similar symptoms.  Okay to try OTC azo for symptoms.  If symptoms continue to persist, return to clinic.  With nausea/vomiting and unable to keep fluids down, go to ER.

## 2020-09-17 NOTE — Patient Instructions (Signed)
Interstitial Cystitis  Interstitial cystitis is inflammation of the bladder. This may cause pain in the bladder area as well as a frequent and urgent need to urinate. The bladder is a hollow organ in the lower part of the abdomen. It stores urine after the urine is made in the kidneys. The severity of interstitial cystitis can vary from person to person. You may have flare-ups, and then your symptoms may go away for a while. For many people, it becomes a long-term (chronic) problem. What are the causes? The cause of this condition is not known. What increases the risk? The following factors may make you more likely to develop this condition:  You are female.  You have fibromyalgia.  You have irritable bowel syndrome (IBS).  You have endometriosis. This condition may be aggravated by:  Stress.  Smoking.  Spicy foods. What are the signs or symptoms? Symptoms of interstitial cystitis vary, and they can change over time. Symptoms may include:  Discomfort or pain in the bladder area, which is in the lower abdomen. Pain can range from mild to severe. The pain may change in intensity as the bladder fills with urine or as it empties.  Pain in the pelvic area, between the hip bones.  An urgent need to urinate.  Frequent urination.  Pain during urination.  Pain during sex.  Blood in the urine. For women, symptoms often get worse during menstruation. How is this diagnosed? This condition is diagnosed based on your symptoms, your medical history, and a physical exam. You may have tests to rule out other conditions, such as:  Urine tests.  Cystoscopy. For this test, a tool similar to a very thin telescope is used to look into your bladder.  Biopsy. This involves taking a sample of tissue from the bladder to be examined under a microscope. How is this treated? There is no cure for this condition, but treatment can help you control your symptoms. Work closely with your health care  provider to find the most effective treatments for you. Treatment options may include:  Medicines to relieve pain and reduce how often you feel the need to urinate.  Learning ways to control when you urinate (bladder training).  Lifestyle changes, such as changing your diet or taking steps to control stress.  Using a device that provides electrical stimulation to your nerves, which can relieve pain (neuromodulation therapy). The device is placed on your back, where it blocks the nerves that cause you to feel pain in your bladder area.  A procedure that stretches your bladder by filling it with air or fluid.  Surgery. This is rare. It is only done for extreme cases, if other treatments do not help. Follow these instructions at home: Bladder training   Use bladder training techniques as directed. Techniques may include: ? Urinating at scheduled times. ? Training yourself to delay urination. ? Doing exercises (Kegel exercises) to strengthen the muscles that control urine flow.  Keep a bladder diary. ? Write down the times that you urinate and any symptoms that you have. This can help you find out which foods, liquids, or activities make your symptoms worse. ? Use your bladder diary to schedule bathroom trips. If you are away from home, plan to be near a bathroom at each of your scheduled times.  Make sure that you urinate just before you leave the house and just before you go to bed. Eating and drinking  Make dietary changes as recommended by your health care provider. You  may need to avoid: ? Spicy foods. ? Foods that contain a lot of potassium.  Limit your intake of beverages that make you need to urinate. These include: ? Caffeinated beverages like soda, coffee, and tea. ? Alcohol. General instructions  Take over-the-counter and prescription medicines only as told by your health care provider.  Do not drink alcohol.  You can try a warm or cool compress over your bladder for  comfort.  Avoid wearing tight clothing.  Do not use any products that contain nicotine or tobacco, such as cigarettes and e-cigarettes. If you need help quitting, ask your health care provider.  Keep all follow-up visits as told by your health care provider. This is important. Contact a health care provider if you have:  Symptoms that do not get better with treatment.  Pain or discomfort that gets worse.  More frequent urges to urinate.  A fever. Get help right away if:  You have no control over when you urinate. Summary  Interstitial cystitis is inflammation of the bladder.  This condition may cause pain in the bladder area as well as a frequent and urgent need to urinate.  You may have flare-ups of the condition, and then it may go away for a while. For many people, it becomes a long-term (chronic) problem.  There is no cure for interstitial cystitis, but treatment methods are available to control your symptoms. This information is not intended to replace advice given to you by your health care provider. Make sure you discuss any questions you have with your health care provider. Document Revised: 08/10/2017 Document Reviewed: 07/23/2017 Elsevier Patient Education  2020 [employer].

## 2020-09-19 LAB — URINE CULTURE
MICRO NUMBER:: 11394365
SPECIMEN QUALITY:: ADEQUATE

## 2020-09-20 ENCOUNTER — Encounter: Payer: Self-pay | Admitting: Family Medicine

## 2020-09-20 MED ORDER — FOSFOMYCIN TROMETHAMINE 3 G PO PACK: 3.0000 g | PACK | Freq: Once | ORAL | 0 refills | Status: AC

## 2020-09-20 NOTE — Progress Notes (Signed)
Spoke with pt regarding urine results. She is aware of the medication she has at the pharmacy

## 2020-09-20 NOTE — Addendum Note (Signed)
Addended by: Noemi Chapel A on: 09/20/2020 09:16 AM   Modules accepted: Orders

## 2020-10-07 DIAGNOSIS — F909 Attention-deficit hyperactivity disorder, unspecified type: Secondary | ICD-10-CM | POA: Diagnosis not present

## 2020-10-07 DIAGNOSIS — F419 Anxiety disorder, unspecified: Secondary | ICD-10-CM | POA: Diagnosis not present

## 2020-10-07 DIAGNOSIS — F3341 Major depressive disorder, recurrent, in partial remission: Secondary | ICD-10-CM | POA: Diagnosis not present

## 2020-10-07 DIAGNOSIS — Z79891 Long term (current) use of opiate analgesic: Secondary | ICD-10-CM | POA: Diagnosis not present

## 2020-10-08 ENCOUNTER — Other Ambulatory Visit: Payer: Self-pay

## 2020-10-08 ENCOUNTER — Ambulatory Visit (INDEPENDENT_AMBULATORY_CARE_PROVIDER_SITE_OTHER): Payer: Medicare Other | Admitting: Family Medicine

## 2020-10-08 VITALS — BP 130/76 | HR 83 | Temp 97.6°F | Resp 16 | Ht 67.0 in | Wt 183.0 lb

## 2020-10-08 DIAGNOSIS — M25552 Pain in left hip: Secondary | ICD-10-CM

## 2020-10-08 NOTE — Progress Notes (Signed)
Subjective:    Patient ID: [name], female    DOB: [birth year], 80 y.o.   MRN: [id number]  Patient presents today with left hip pain.  She states is been hurting for years but is gradually gotten worse.  She reports pain over the anterior left hip joint as well as over the greater trochanteric bursa.  She states that it hurts to stand.  It hurts to walk.  It is compromising her quality of life.  She denies any sciatica.  She denies any bowel or bladder incontinence.  She denies any leg weakness.  However she states that hurts in her hip to the point that she has to lift her leg in order to cross her legs.  On physical exam today she does have some mild levoscoliosis.  She has pain with flexion of the left hip.  She also has pain with external rotation and tenderness to palpation over the greater trochanteric bursa.  She has a negative straight leg raise other than pain in the hip joint.  She denies any back pain.  She has forward flexion to 90 degrees Past Medical History:  Diagnosis Date  . ADD (attention deficit disorder)   . Allergic rhinitis   . Anemia   . Anxiety   . Arrhythmia   . Arthritis    "hands, neck, right shoulder" (07/28/2015)  . Aseptic necrosis ([place])   . Asthma dxc'd 05/2015  . Basal cell cancer   . Bleeding stomach ulcer ~ 06/2005  . COPD (chronic obstructive pulmonary disease) ([place])   . Depression   . Diverticula of colon   . Dyspnea   . Esophagitis   . Gastric ulcer with hemorrhage   . GERD (gastroesophageal reflux disease)   . Heart murmur   . History of blood transfusion 06/2005   "related to pneumonia"  . History of kidney stones   . Hyperlipidemia   . Hypertension   . Hypothyroidism   . Kidney stones   . Lung cancer ([place])    Non small cell lung cancer, adenocarcinoma with BAC 09/2009, VATS converted to wedge resection of left upper lobe mass  . Mitral valve prolapse    "no ORs" (07/28/2015)  . Osteopenia   . Pneumonia 1970's; 06/2005  .  Sleep apnea dx'd 06/2015   on oxygen at nite at 2L/[place]   . Squamous cell skin cancer    Past Surgical History:  Procedure Laterality Date  . ABDOMINAL HYSTERECTOMY  1988  . COLONOSCOPY WITH PROPOFOL N/A 06/15/2017   Procedure: COLONOSCOPY WITH PROPOFOL;  Surgeon: Carol Ada, MD;  Location: [facility];  Service: Endoscopy;  Laterality: N/A;  . HAMMER TOE SURGERY Right ~ 2010  . SALPINGOOPHORECTOMY Left 1988  . SKIN CANCER EXCISION     "I've had severl cut off RLE; left foreqrm, nose under nose"  . THORACOTOMY Left 06/2005  . THORACOTOMY/LOBECTOMY  09/2009; 11/2009   left; right   Current Outpatient Medications on File Prior to Visit  Medication Sig Dispense Refill  . acetaminophen (TYLENOL) 325 MG tablet Take by mouth.    Marland Kitchen albuterol (PROVENTIL) (2.5 MG/3ML) 0.083% nebulizer solution USE 1 VIAL (3 ML OR 2.5 MG TOTAL) VIA NEBULIZER EVERY 4 HOURS AS NEEDED FOR WHEEZING OR SHORTNESS OF BREATH 450 mL 3  . albuterol (VENTOLIN HFA) 108 (90 Base) MCG/ACT inhaler Inhale 1 puff into the lungs every 6 (six) hours as needed for wheezing or shortness of breath.    . ARIPiprazole (ABILIFY) 10 MG tablet Take  1 tablet (10 mg total) by mouth daily. 90 tablet 1  . buPROPion (WELLBUTRIN XL) 150 MG 24 hr tablet TAKE 1 TABLET DAILY 90 tablet 3  . Calcium Carbonate-Vit D-Min (CALCIUM 1200 PO) Take 50 mg by mouth. ONCE A WEEK    . CLINPRO 5000 1.1 % PSTE Place onto teeth.    . clonazePAM (KLONOPIN) 0.5 MG tablet Take 1 tablet (0.5 mg total) by mouth at bedtime as needed for anxiety. 30 tablet 1  . co-enzyme Q-10 30 MG capsule Take 100 mg by mouth 3 (three) times daily.    . Cyanocobalamin 1000 MCG CAPS Take by mouth.    . doxazosin (CARDURA) 4 MG tablet TAKE 1 TABLET DAILY 90 tablet 3  . fluticasone (FLONASE) 50 MCG/ACT nasal spray USE 2 SPRAYS IN EACH NOSTRIL DAILY 48 g 3  . fosfomycin (MONUROL) 3 g PACK     . LEVOXYL 112 MCG tablet TAKE 1 TABLET DAILY (DISCONTINUE LEVOTHYROXINE 100) 90 tablet 3  .  losartan-hydrochlorothiazide (HYZAAR) 100-25 MG tablet TAKE 1 TABLET DAILY 90 tablet 3  . mirtazapine (REMERON) 7.5 MG tablet Take 7.5 mg by mouth at bedtime.    . Multiple Vitamins-Minerals (EMERGEN-C FIVE PO) Take by mouth.    Marland Kitchen omeprazole (PRILOSEC) 20 MG capsule Take 1 capsule (20 mg total) by mouth 2 (two) times daily before a meal. 90 capsule 3  . OXYGEN 2lpm with sleep    . pravastatin (PRAVACHOL) 40 MG tablet TAKE 1 TABLET DAILY 90 tablet 3  . thiamine (VITAMIN B-1) 100 MG tablet Take 100 mg by mouth daily.    . traZODone (DESYREL) 50 MG tablet     . TRELEGY ELLIPTA 100-62.5-25 MCG/INH AEPB USE 1 INHALATION DAILY (DISCONTINUE BEVESPI) 180 each 3  . triamcinolone (KENALOG) 0.1 % Apply 1 application topically 2 (two) times daily. 453 g 0  . VITAMIN D, CHOLECALCIFEROL, PO Take 1 tablet by mouth daily.     No current facility-administered medications on file prior to visit.   Allergies  Allergen Reactions  . Amoxicillin     Diarrhea, doesnt tolerate   . Doxycycline Nausea Only  . Erythromycin Nausea Only  . Sulfonamide Derivatives Hives   Social History   Socioeconomic History  . Marital status: Married    Spouse name: Not on file  . Number of children: 3  . Years of education: Not on file  . Highest education level: Not on file  Occupational History  . Occupation: retired Optician, dispensing: RETIRED  Tobacco Use  . Smoking status: Former Smoker    Packs/day: 0.75    Years: 30.00    Pack years: 22.50    Types: Cigarettes    Quit date: 09/11/2004    Years since quitting: 16.0  . Smokeless tobacco: Never Used  Vaping Use  . Vaping Use: Never used  Substance and Sexual Activity  . Alcohol use: Yes    Alcohol/week: 7.0 standard drinks    Types: 7 Shots of liquor per week    Comment: 1-2 drinks nightly   . Drug use: No  . Sexual activity: Yes  Other Topics Concern  . Not on file  Social History Narrative  . Not on file   Social Determinants of Health   Financial  Resource Strain: Not on file  Food Insecurity: Not on file  Transportation Needs: Not on file  Physical Activity: Not on file  Stress: Not on file  Social Connections: Not on file  Intimate Partner Violence:  Not on file      Review of Systems  Psychiatric/Behavioral: Positive for depression.  All other systems reviewed and are negative.      Objective:   Physical Exam Vitals reviewed.  Constitutional:      General: She is not in acute distress.    Appearance: Normal appearance. She is normal weight. She is not ill-appearing or toxic-appearing.  Cardiovascular:     Rate and Rhythm: Normal rate and regular rhythm.  Pulmonary:     Effort: Pulmonary effort is normal.     Breath sounds: Normal breath sounds.  Musculoskeletal:     Left hip: Tenderness, bony tenderness and crepitus present. Decreased range of motion. Decreased strength.  Neurological:     Mental Status: She is alert.           Assessment & Plan:  Left hip pain - Plan: DG Hip Unilat W OR W/O Pelvis 2-3 Views Left  I suspect osteoarthritis in the left hip.  Begin by obtaining an x-ray of the left hip given her cancer history.  If the x-ray confirms moderate osteoarthritis I will be glad to consult physical therapy particular given the weakness in her hip flexors.  However if there is extensive osteoarthritis she may want to consider a cortisone injection.

## 2020-10-15 ENCOUNTER — Ambulatory Visit
Admission: RE | Admit: 2020-10-15 | Discharge: 2020-10-15 | Disposition: A | Discharge status: Home / Self Care | Payer: Medicare Other | Source: Ambulatory Visit | Attending: Family Medicine | Admitting: Family Medicine

## 2020-10-15 DIAGNOSIS — M47816 Spondylosis without myelopathy or radiculopathy, lumbar region: Secondary | ICD-10-CM | POA: Diagnosis not present

## 2020-10-15 DIAGNOSIS — M4186 Other forms of scoliosis, lumbar region: Secondary | ICD-10-CM | POA: Diagnosis not present

## 2020-10-15 DIAGNOSIS — M25552 Pain in left hip: Secondary | ICD-10-CM

## 2020-10-25 ENCOUNTER — Other Ambulatory Visit: Payer: Self-pay

## 2020-10-25 ENCOUNTER — Telehealth: Payer: Self-pay | Admitting: Family Medicine

## 2020-10-25 DIAGNOSIS — M25552 Pain in left hip: Secondary | ICD-10-CM

## 2020-10-25 NOTE — Telephone Encounter (Signed)
Referral to PT placed

## 2020-10-25 NOTE — Telephone Encounter (Signed)
Pt called wanting to get a referral for Physical Therapy. She recently had some x-rays done. Please call back when this can be set up.  Cb#: [phone]

## 2020-11-01 ENCOUNTER — Telehealth: Payer: Self-pay | Admitting: Family Medicine

## 2020-11-01 NOTE — Telephone Encounter (Signed)
Employee from [facility] would like a copy of the orders for rehab faxed over for pt.  Fax number [phone]

## 2020-11-02 NOTE — Telephone Encounter (Signed)
Please have caller fax signed request of records for order/ chart notes.

## 2020-11-05 ENCOUNTER — Encounter (HOSPITAL_COMMUNITY): Payer: Self-pay

## 2020-11-05 ENCOUNTER — Inpatient Hospital Stay: Payer: Medicare Other | Attending: Internal Medicine

## 2020-11-05 ENCOUNTER — Ambulatory Visit (HOSPITAL_COMMUNITY)
Admission: RE | Admit: 2020-11-05 | Discharge: 2020-11-05 | Disposition: A | Discharge status: Home / Self Care | Payer: Medicare Other | Source: Ambulatory Visit | Attending: Internal Medicine | Admitting: Internal Medicine

## 2020-11-05 ENCOUNTER — Other Ambulatory Visit: Payer: Self-pay

## 2020-11-05 ENCOUNTER — Other Ambulatory Visit: Payer: Self-pay | Admitting: Family Medicine

## 2020-11-05 DIAGNOSIS — F32A Depression, unspecified: Secondary | ICD-10-CM | POA: Diagnosis not present

## 2020-11-05 DIAGNOSIS — M8580 Other specified disorders of bone density and structure, unspecified site: Secondary | ICD-10-CM | POA: Diagnosis not present

## 2020-11-05 DIAGNOSIS — Z79899 Other long term (current) drug therapy: Secondary | ICD-10-CM | POA: Diagnosis not present

## 2020-11-05 DIAGNOSIS — Z882 Allergy status to sulfonamides status: Secondary | ICD-10-CM | POA: Insufficient documentation

## 2020-11-05 DIAGNOSIS — Z902 Acquired absence of lung [part of]: Secondary | ICD-10-CM | POA: Diagnosis not present

## 2020-11-05 DIAGNOSIS — F419 Anxiety disorder, unspecified: Secondary | ICD-10-CM | POA: Insufficient documentation

## 2020-11-05 DIAGNOSIS — Z87442 Personal history of urinary calculi: Secondary | ICD-10-CM | POA: Diagnosis not present

## 2020-11-05 DIAGNOSIS — C3412 Malignant neoplasm of upper lobe, left bronchus or lung: Secondary | ICD-10-CM | POA: Insufficient documentation

## 2020-11-05 DIAGNOSIS — S2242XA Multiple fractures of ribs, left side, initial encounter for closed fracture: Secondary | ICD-10-CM | POA: Diagnosis not present

## 2020-11-05 DIAGNOSIS — I2510 Atherosclerotic heart disease of native coronary artery without angina pectoris: Secondary | ICD-10-CM | POA: Insufficient documentation

## 2020-11-05 DIAGNOSIS — C3411 Malignant neoplasm of upper lobe, right bronchus or lung: Secondary | ICD-10-CM | POA: Diagnosis not present

## 2020-11-05 DIAGNOSIS — M47816 Spondylosis without myelopathy or radiculopathy, lumbar region: Secondary | ICD-10-CM | POA: Insufficient documentation

## 2020-11-05 DIAGNOSIS — Z881 Allergy status to other antibiotic agents status: Secondary | ICD-10-CM | POA: Insufficient documentation

## 2020-11-05 DIAGNOSIS — Z880 Allergy status to penicillin: Secondary | ICD-10-CM | POA: Insufficient documentation

## 2020-11-05 DIAGNOSIS — I700 Atherosclerosis of aorta: Secondary | ICD-10-CM | POA: Diagnosis not present

## 2020-11-05 DIAGNOSIS — Z923 Personal history of irradiation: Secondary | ICD-10-CM | POA: Insufficient documentation

## 2020-11-05 DIAGNOSIS — M25552 Pain in left hip: Secondary | ICD-10-CM | POA: Insufficient documentation

## 2020-11-05 DIAGNOSIS — C3490 Malignant neoplasm of unspecified part of unspecified bronchus or lung: Secondary | ICD-10-CM | POA: Insufficient documentation

## 2020-11-05 DIAGNOSIS — J449 Chronic obstructive pulmonary disease, unspecified: Secondary | ICD-10-CM | POA: Insufficient documentation

## 2020-11-05 DIAGNOSIS — J984 Other disorders of lung: Secondary | ICD-10-CM | POA: Diagnosis not present

## 2020-11-05 DIAGNOSIS — Z90721 Acquired absence of ovaries, unilateral: Secondary | ICD-10-CM | POA: Diagnosis not present

## 2020-11-05 LAB — CBC WITH DIFFERENTIAL (CANCER CENTER ONLY)
Abs Immature Granulocytes: 0.02 10*3/uL (ref 0.00–0.07)
Basophils Absolute: 0.1 10*3/uL (ref 0.0–0.1)
Basophils Relative: 1 %
Eosinophils Absolute: 0.3 10*3/uL (ref 0.0–0.5)
Eosinophils Relative: 4 %
HCT: 42.1 % (ref 36.0–46.0)
Hemoglobin: 14.4 g/dL (ref 12.0–15.0)
Immature Granulocytes: 0 %
Lymphocytes Relative: 29 %
Lymphs Abs: 2.2 10*3/uL (ref 0.7–4.0)
MCH: 33.7 pg (ref 26.0–34.0)
MCHC: 34.2 g/dL (ref 30.0–36.0)
MCV: 98.6 fL (ref 80.0–100.0)
Monocytes Absolute: 0.6 10*3/uL (ref 0.1–1.0)
Monocytes Relative: 9 %
Neutro Abs: 4.2 10*3/uL (ref 1.7–7.7)
Neutrophils Relative %: 57 %
Platelet Count: 268 10*3/uL (ref 150–400)
RBC: 4.27 MIL/uL (ref 3.87–5.11)
RDW: 12.4 % (ref 11.5–15.5)
WBC Count: 7.4 10*3/uL (ref 4.0–10.5)
nRBC: 0 % (ref 0.0–0.2)

## 2020-11-05 LAB — CMP (CANCER CENTER ONLY)
ALT: 15 U/L (ref 0–44)
AST: 18 U/L (ref 15–41)
Albumin: 4 g/dL (ref 3.5–5.0)
Alkaline Phosphatase: 82 U/L (ref 38–126)
Anion gap: 8 (ref 5–15)
BUN: 16 mg/dL (ref 8–23)
CO2: 28 mmol/L (ref 22–32)
Calcium: 9.1 mg/dL (ref 8.9–10.3)
Chloride: 105 mmol/L (ref 98–111)
Creatinine: 0.76 mg/dL (ref 0.44–1.00)
GFR, Estimated: >60 mL/min (ref >60)
Glucose, Bld: 99 mg/dL (ref 70–99)
Potassium: 4.3 mmol/L (ref 3.5–5.1)
Sodium: 141 mmol/L (ref 135–145)
Total Bilirubin: 0.2 mg/dL — ABNORMAL LOW (ref 0.3–1.2)
Total Protein: 6.9 g/dL (ref 6.5–8.1)

## 2020-11-05 MED ORDER — IOHEXOL 300 MG/ML  SOLN
75.0000 mL | Freq: Once | INTRAMUSCULAR | Status: AC | PRN
  Administered 2020-11-05: 75 mL via INTRAVENOUS

## 2020-11-08 ENCOUNTER — Encounter: Payer: Self-pay | Admitting: Internal Medicine

## 2020-11-08 ENCOUNTER — Encounter: Payer: Self-pay | Admitting: *Deleted

## 2020-11-08 ENCOUNTER — Inpatient Hospital Stay (HOSPITAL_BASED_OUTPATIENT_CLINIC_OR_DEPARTMENT_OTHER): Payer: Medicare Other | Admitting: Internal Medicine

## 2020-11-08 ENCOUNTER — Other Ambulatory Visit: Payer: Self-pay

## 2020-11-08 DIAGNOSIS — C3490 Malignant neoplasm of unspecified part of unspecified bronchus or lung: Secondary | ICD-10-CM | POA: Diagnosis not present

## 2020-11-08 DIAGNOSIS — C3411 Malignant neoplasm of upper lobe, right bronchus or lung: Secondary | ICD-10-CM | POA: Diagnosis not present

## 2020-11-08 DIAGNOSIS — F419 Anxiety disorder, unspecified: Secondary | ICD-10-CM | POA: Diagnosis not present

## 2020-11-08 DIAGNOSIS — I700 Atherosclerosis of aorta: Secondary | ICD-10-CM | POA: Diagnosis not present

## 2020-11-08 DIAGNOSIS — F32A Depression, unspecified: Secondary | ICD-10-CM | POA: Diagnosis not present

## 2020-11-08 DIAGNOSIS — C3412 Malignant neoplasm of upper lobe, left bronchus or lung: Secondary | ICD-10-CM | POA: Diagnosis not present

## 2020-11-08 DIAGNOSIS — I2510 Atherosclerotic heart disease of native coronary artery without angina pectoris: Secondary | ICD-10-CM | POA: Diagnosis not present

## 2020-11-08 NOTE — Progress Notes (Signed)
I spoke with Ms. [name] today.  She is doing well without complaints.  Her plan of care in follow up yearly with scans.  She verbalized understanding.

## 2020-11-08 NOTE — Progress Notes (Signed)
[place] [phone]   [phone]  OFFICE PROGRESS NOTE  Susy Frizzle, MD [address] [place] [address]  DIAGNOSIS: history of a bilateral stage IA non-small cell lung cancer involving the left upper lobe as well as right upper lobe  PRIOR THERAPY: 1)  status post wedge resection of the 2 lesions in 2011 under the care of Dr. Arlyce Dice.  2) systemic chemotherapy with carboplatin, Alimta and Avastin for 6 cycles at [facility] completed in 2011. 3) stereotactic radiotherapy to the enlarging left upper lobe lung nodule in 2014.  CURRENT THERAPY: Observation.  INTERVAL HISTORY: [name] 80 y.o. female returns to the clinic today for annual follow-up visit. The patient is feeling fine today with no concerning complaints. Her breathing is better this days. She denied having any current chest pain, cough or hemoptysis. She denied having any fever or chills. She has no nausea, vomiting, diarrhea or constipation. She denied having any headache or visual changes. She is here today for evaluation and repeat CT scan of the chest.  MEDICAL HISTORY: Past Medical History:  Diagnosis Date  . ADD (attention deficit disorder)   . Allergic rhinitis   . Anemia   . Anxiety   . Arrhythmia   . Arthritis    "hands, neck, right shoulder" (07/28/2015)  . Aseptic necrosis ([place])   . Asthma dxc'd 05/2015  . Basal cell cancer   . Bleeding stomach ulcer ~ 06/2005  . COPD (chronic obstructive pulmonary disease) ([place])   . Depression   . Diverticula of colon   . Dyspnea   . Esophagitis   . Gastric ulcer with hemorrhage   . GERD (gastroesophageal reflux disease)   . Heart murmur   . History of blood transfusion 06/2005   "related to pneumonia"  . History of kidney stones   . Hyperlipidemia   . Hypertension   . Hypothyroidism   . Kidney stones   . Lung cancer ([place])    Non small cell lung cancer, adenocarcinoma  with BAC 09/2009, VATS converted to wedge resection of left upper lobe mass  . Mitral valve prolapse    "no ORs" (07/28/2015)  . Osteopenia   . Pneumonia 1970's; 06/2005  . Sleep apnea dx'd 06/2015   on oxygen at nite at 2L/[place]   . Squamous cell skin cancer     ALLERGIES:  is allergic to amoxicillin, doxycycline, erythromycin, and sulfonamide derivatives.  MEDICATIONS:  Current Outpatient Medications  Medication Sig Dispense Refill  . acetaminophen (TYLENOL) 325 MG tablet Take by mouth.    Marland Kitchen albuterol (PROVENTIL) (2.5 MG/3ML) 0.083% nebulizer solution USE 1 VIAL (3 ML OR 2.5 MG TOTAL) VIA NEBULIZER EVERY 4 HOURS AS NEEDED FOR WHEEZING OR SHORTNESS OF BREATH 450 mL 3  . albuterol (VENTOLIN HFA) 108 (90 Base) MCG/ACT inhaler Inhale 1 puff into the lungs every 6 (six) hours as needed for wheezing or shortness of breath.    . ARIPiprazole (ABILIFY) 10 MG tablet Take 1 tablet (10 mg total) by mouth daily. 90 tablet 1  . buPROPion (WELLBUTRIN XL) 150 MG 24 hr tablet TAKE 1 TABLET DAILY 90 tablet 3  . Calcium Carbonate-Vit D-Min (CALCIUM 1200 PO) Take 50 mg by mouth. ONCE A WEEK    . CLINPRO 5000 1.1 % PSTE Place onto teeth.    . clonazePAM (KLONOPIN) 0.5 MG tablet Take 1 tablet (0.5 mg total) by mouth at bedtime as needed for  anxiety. 30 tablet 1  . co-enzyme Q-10 30 MG capsule Take 100 mg by mouth 3 (three) times daily.    . Cyanocobalamin 1000 MCG CAPS Take by mouth.    . doxazosin (CARDURA) 4 MG tablet TAKE 1 TABLET DAILY 90 tablet 3  . fluticasone (FLONASE) 50 MCG/ACT nasal spray USE 2 SPRAYS IN EACH NOSTRIL DAILY 48 g 3  . fosfomycin (MONUROL) 3 g PACK     . LEVOXYL 112 MCG tablet TAKE 1 TABLET DAILY (DISCONTINUE LEVOTHYROXINE 100) 90 tablet 3  . losartan-hydrochlorothiazide (HYZAAR) 100-25 MG tablet TAKE 1 TABLET DAILY 90 tablet 3  . mirtazapine (REMERON) 7.5 MG tablet Take 7.5 mg by mouth at bedtime.    . Multiple Vitamins-Minerals (EMERGEN-C FIVE PO) Take by mouth.    Marland Kitchen omeprazole  (PRILOSEC) 20 MG capsule Take 1 capsule (20 mg total) by mouth 2 (two) times daily before a meal. 90 capsule 3  . OXYGEN 2lpm with sleep    . pravastatin (PRAVACHOL) 40 MG tablet TAKE 1 TABLET DAILY 90 tablet 3  . thiamine (VITAMIN B-1) 100 MG tablet Take 100 mg by mouth daily.    . traZODone (DESYREL) 50 MG tablet     . TRELEGY ELLIPTA 100-62.5-25 MCG/INH AEPB USE 1 INHALATION DAILY (DISCONTINUE BEVESPI) 180 each 3  . triamcinolone (KENALOG) 0.1 % Apply 1 application topically 2 (two) times daily. 453 g 0  . VITAMIN D, CHOLECALCIFEROL, PO Take 1 tablet by mouth daily.     No current facility-administered medications for this visit.    SURGICAL HISTORY:  Past Surgical History:  Procedure Laterality Date  . ABDOMINAL HYSTERECTOMY  1988  . COLONOSCOPY WITH PROPOFOL N/A 06/15/2017   Procedure: COLONOSCOPY WITH PROPOFOL;  Surgeon: Carol Ada, MD;  Location: [facility];  Service: Endoscopy;  Laterality: N/A;  . HAMMER TOE SURGERY Right ~ 2010  . SALPINGOOPHORECTOMY Left 1988  . SKIN CANCER EXCISION     "I've had severl cut off RLE; left foreqrm, nose under nose"  . THORACOTOMY Left 06/2005  . THORACOTOMY/LOBECTOMY  09/2009; 11/2009   left; right    REVIEW OF SYSTEMS:  A comprehensive review of systems was negative except for: Respiratory: positive for dyspnea on exertion   PHYSICAL EXAMINATION: General appearance: alert, cooperative and no distress Head: Normocephalic, without obvious abnormality, atraumatic Neck: no adenopathy, no JVD, supple, symmetrical, trachea midline and thyroid not enlarged, symmetric, no tenderness/mass/nodules Lymph nodes: Cervical, supraclavicular, and axillary nodes normal. Resp: clear to auscultation bilaterally Back: symmetric, no curvature. ROM normal. No CVA tenderness. Cardio: regular rate and rhythm, S1, S2 normal, no murmur, click, rub or gallop GI: soft, non-tender; bowel sounds normal; no masses,  no organomegaly Extremities: extremities normal,  atraumatic, no cyanosis or edema  ECOG PERFORMANCE STATUS: 1 - Symptomatic but completely ambulatory  Blood pressure (!) 143/76, pulse 80, temperature 100.3 F (37.9 C), temperature source Tympanic, resp. rate 16, height 5\' 7"  (1.702 m), weight 188 lb 12.8 oz (85.6 kg), SpO2 95 %.  LABORATORY DATA: Lab Results  Component Value Date   WBC 7.4 11/05/2020   HGB 14.4 11/05/2020   HCT 42.1 11/05/2020   MCV 98.6 11/05/2020   PLT 268 11/05/2020      Chemistry      Component Value Date/Time   NA 141 11/05/2020 0948   K 4.3 11/05/2020 0948   CL 105 11/05/2020 0948   CO2 28 11/05/2020 0948   BUN 16 11/05/2020 0948   CREATININE 0.76 11/05/2020 0948   CREATININE 0.76  07/15/2020 0937      Component Value Date/Time   CALCIUM 9.1 11/05/2020 0948   ALKPHOS 82 11/05/2020 0948   AST 18 11/05/2020 0948   ALT 15 11/05/2020 0948   BILITOT 0.2 (L) 11/05/2020 0948       RADIOGRAPHIC STUDIES: CT Chest W Contrast  Result Date: 11/06/2020 CLINICAL DATA:  80 year old female with history of non-small cell lung cancer. Staging examination. EXAM: CT CHEST WITH CONTRAST TECHNIQUE: Multidetector CT imaging of the chest was performed during intravenous contrast administration. CONTRAST:  50mL OMNIPAQUE IOHEXOL 300 MG/ML  SOLN COMPARISON:  Chest CT 05/03/2020. FINDINGS: Cardiovascular: Heart size is normal. There is no significant pericardial fluid, thickening or pericardial calcification. There is aortic atherosclerosis, as well as atherosclerosis of the great vessels of the mediastinum and the coronary arteries, including calcified atherosclerotic plaque in the left main and left anterior descending coronary arteries. Lipomatous hypertrophy of the interatrial septum. Mediastinum/Nodes: No pathologically enlarged mediastinal or hilar lymph nodes. Esophagus is unremarkable in appearance. No axillary lymphadenopathy. Lungs/Pleura: Status post right upper lobectomy. Compensatory hyperexpansion of the right  middle and lower lobes. Suture line in the periphery of the left upper lobe with adjacent mass-like area of chronic architectural distortion which is very similar to the prior examination measuring up to 3.5 x 2.3 cm on today's examination (axial image 48 of series 7), favored to reflect an area of chronic postradiation mass-like fibrosis. Several other smaller pulmonary nodules scattered throughout the lungs bilaterally are also similar in number and size to the prior study, most notable of which is a mixed solid and sub solid lesion in the anterior aspect of the right lower lobe (axial image 68 of series 7) which has a ground-glass attenuation component measuring 1.6 x 1.2 cm and a central solid component measuring 5 mm. No other new or clearly enlarging pulmonary nodules or masses are noted. No acute consolidative airspace disease. No pleural effusions. Upper Abdomen: Unremarkable. Musculoskeletal: Multiple old healed left-sided rib fractures are again noted involving the lateral aspects of the left fourth, fifth, 6, seventh and eighth ribs. There are no aggressive appearing lytic or blastic lesions noted in the visualized portions of the skeleton. IMPRESSION: 1. Stable examination demonstrating extensive postoperative scarring and post radiation mass-like fibrosis in the left upper lobe, with numerous small pulmonary nodules scattered throughout the lungs bilaterally which appear stable in number and size compared to prior examinations. 2. Aortic atherosclerosis, in addition to left main and left anterior descending coronary artery disease. Assessment for potential risk factor modification, dietary therapy or pharmacologic therapy may be warranted, if clinically indicated. 3. Lipomatous hypertrophy of the interatrial septum. Aortic Atherosclerosis (ICD10-I70.0). Electronically Signed   By: Vinnie Langton M.D.   On: 11/06/2020 09:43   DG Hip Unilat W OR W/O Pelvis 2-3 Views Left  Result Date:  10/15/2020 CLINICAL DATA:  Left hip pain EXAM: DG HIP (WITH OR WITHOUT PELVIS) 2-3V LEFT COMPARISON:  None. FINDINGS: Normal alignment no fracture. Joint space normal. Mild calcification lateral to the femoral head. This may be degenerative versus calcific tendinitis. Lumbar scoliosis and degenerative change right greater than left L3-4 and L4-5. Similar soft tissue calcification lateral to the right femoral head IMPRESSION: Negative for fracture Soft tissue calcification lateral to the femoral head bilaterally may be calcific tendinitis or degenerative soft tissue calcification. Electronically Signed   By: Franchot Gallo M.D.   On: 10/15/2020 16:55    ASSESSMENT AND PLAN: This is a very pleasant 80 years old white female  with history of bilateral stage Ia non-small cell lung cancer involving the left upper lobe as well as the right upper lobe status post wedge resection of the 2 lesions in 2011 under the care of Dr. Arlyce Dice.  The patient also received adjuvant systemic chemotherapy for 6 cycles at [facility] [facility] with carboplatin, Alimta and Avastin.  She also received stereotactic radiotherapy to the enlarging left upper lobe lung nodule in 2014.  The patient is currently on observation and she is feeling fine today with no concerning complaints except for occasional shortness of breath. She had repeat CT scan of the chest performed recently. I personally and independently reviewed the scans and discussed the results with the patient today. Her scan showed no concerning findings for disease progression. I recommended her to continue on observation with repeat CT scan of the chest in 1 year. She was advised to call immediately if she has any concerning symptoms in the interval. The patient voices understanding of current disease status and treatment options and is in agreement with the current care plan.  All questions were answered. The patient knows to call the clinic with any problems,  questions or concerns. We can certainly see the patient much sooner if necessary.   Disclaimer: This note was dictated with voice recognition software. Similar sounding words can inadvertently be transcribed and may not be corrected upon review.

## 2020-11-09 ENCOUNTER — Telehealth: Payer: Self-pay | Admitting: Internal Medicine

## 2020-11-09 DIAGNOSIS — M25652 Stiffness of left hip, not elsewhere classified: Secondary | ICD-10-CM | POA: Diagnosis not present

## 2020-11-09 DIAGNOSIS — R269 Unspecified abnormalities of gait and mobility: Secondary | ICD-10-CM | POA: Diagnosis not present

## 2020-11-09 DIAGNOSIS — M62552 Muscle wasting and atrophy, not elsewhere classified, left thigh: Secondary | ICD-10-CM | POA: Diagnosis not present

## 2020-11-09 DIAGNOSIS — M25552 Pain in left hip: Secondary | ICD-10-CM | POA: Diagnosis not present

## 2020-11-09 NOTE — Telephone Encounter (Signed)
Scheduled per 2/28 los. Called and spoke with pt, confirmed 2/27 and 2/28 appts

## 2020-11-11 DIAGNOSIS — R269 Unspecified abnormalities of gait and mobility: Secondary | ICD-10-CM | POA: Diagnosis not present

## 2020-11-11 DIAGNOSIS — M25552 Pain in left hip: Secondary | ICD-10-CM | POA: Diagnosis not present

## 2020-11-11 DIAGNOSIS — M25652 Stiffness of left hip, not elsewhere classified: Secondary | ICD-10-CM | POA: Diagnosis not present

## 2020-11-11 DIAGNOSIS — M62552 Muscle wasting and atrophy, not elsewhere classified, left thigh: Secondary | ICD-10-CM | POA: Diagnosis not present

## 2020-11-16 DIAGNOSIS — R269 Unspecified abnormalities of gait and mobility: Secondary | ICD-10-CM | POA: Diagnosis not present

## 2020-11-16 DIAGNOSIS — M25552 Pain in left hip: Secondary | ICD-10-CM | POA: Diagnosis not present

## 2020-11-16 DIAGNOSIS — M62552 Muscle wasting and atrophy, not elsewhere classified, left thigh: Secondary | ICD-10-CM | POA: Diagnosis not present

## 2020-11-16 DIAGNOSIS — M25652 Stiffness of left hip, not elsewhere classified: Secondary | ICD-10-CM | POA: Diagnosis not present

## 2020-11-17 DIAGNOSIS — M25652 Stiffness of left hip, not elsewhere classified: Secondary | ICD-10-CM | POA: Diagnosis not present

## 2020-11-17 DIAGNOSIS — M25552 Pain in left hip: Secondary | ICD-10-CM | POA: Diagnosis not present

## 2020-11-17 DIAGNOSIS — R269 Unspecified abnormalities of gait and mobility: Secondary | ICD-10-CM | POA: Diagnosis not present

## 2020-11-17 DIAGNOSIS — M62552 Muscle wasting and atrophy, not elsewhere classified, left thigh: Secondary | ICD-10-CM | POA: Diagnosis not present

## 2020-11-19 DIAGNOSIS — M25552 Pain in left hip: Secondary | ICD-10-CM | POA: Diagnosis not present

## 2020-11-19 DIAGNOSIS — M25652 Stiffness of left hip, not elsewhere classified: Secondary | ICD-10-CM | POA: Diagnosis not present

## 2020-11-19 DIAGNOSIS — R269 Unspecified abnormalities of gait and mobility: Secondary | ICD-10-CM | POA: Diagnosis not present

## 2020-11-19 DIAGNOSIS — M62552 Muscle wasting and atrophy, not elsewhere classified, left thigh: Secondary | ICD-10-CM | POA: Diagnosis not present

## 2020-11-22 DIAGNOSIS — R269 Unspecified abnormalities of gait and mobility: Secondary | ICD-10-CM | POA: Diagnosis not present

## 2020-11-22 DIAGNOSIS — M62552 Muscle wasting and atrophy, not elsewhere classified, left thigh: Secondary | ICD-10-CM | POA: Diagnosis not present

## 2020-11-22 DIAGNOSIS — M25652 Stiffness of left hip, not elsewhere classified: Secondary | ICD-10-CM | POA: Diagnosis not present

## 2020-11-22 DIAGNOSIS — M25552 Pain in left hip: Secondary | ICD-10-CM | POA: Diagnosis not present

## 2020-11-23 ENCOUNTER — Ambulatory Visit (INDEPENDENT_AMBULATORY_CARE_PROVIDER_SITE_OTHER): Payer: Medicare Other | Admitting: Family Medicine

## 2020-11-23 ENCOUNTER — Other Ambulatory Visit: Payer: Self-pay

## 2020-11-23 ENCOUNTER — Encounter: Payer: Self-pay | Admitting: Family Medicine

## 2020-11-23 VITALS — BP 132/84 | HR 82 | Temp 97.7°F | Resp 14 | Ht 67.0 in | Wt 187.0 lb

## 2020-11-23 DIAGNOSIS — R413 Other amnesia: Secondary | ICD-10-CM | POA: Diagnosis not present

## 2020-11-23 DIAGNOSIS — I2510 Atherosclerotic heart disease of native coronary artery without angina pectoris: Secondary | ICD-10-CM

## 2020-11-23 NOTE — Progress Notes (Signed)
Subjective:    Patient ID: [name], female    DOB: [birth year], 80 y.o.   MRN: [id number]  Depression        06/29/20 Patient reports that she feels depressed.  She has been on Cymbalta and bupropion for quite some time.  However for the last several months, she has lost hope.  She states that she has no desire.  She simply sits around her home and has no motivation to leave.  She has no motivation exercise.  All she does is sit in her chair and watch TV.  She is under tremendous stress caring for her husband who has MS as well as dementia.  As result of the stress and the depression she reports memory loss and trouble focusing.  She is having a hard time thinking of words that she wants to say.  She states that she is having trouble sleeping.  Therefore she is self-medicating.  She is taking 2 trazodone 50 mg tablets at night and 100 mg of Benadryl.  I immediately told the patient to stop doing this due to anticholinergic side effects.  Instead I want her to use Xanax 0.5 mg p.o. nightly until we have the depression under control.  She denies any manic thoughts.  She denies any delusions or hallucinations or suicidal ideation or homicidal ideation.  She does report anhedonia and depression and feeling sad.  At that time, my plan was: I believe the stress of caring for her husband with dementia, the loss of her son earlier this year, and her general declining health have all contributed to worsen her depression.  Discontinue Cymbalta as the patient has been on this for many years and start Trintellix 10 mg daily.  Recheck here in 3 weeks or sooner if worsening.  Stop using trazodone and Benadryl at night.  Explained the risk of anticholinergic side effects.  Instead temporarily use Xanax at night to help her self sleep.  See the patient back in 3 weeks.  If this is not working, could consider supplementing with [place] or Abilify.  07/15/20 Patient is not sure that she seen any benefit  since switching from Cymbalta to Trintellix.  However she continues to have trouble sleeping at night.  She states that she wakes up about every day at 3 AM unable to sleep more.  She also reports itching all over her body.  She is scratching and clawing at the skin of the dorsums of her forearms as well as on her anterior shins bilaterally.  There is no visible rash.  There is no erythema.  There is no excoriations.  There is no explanation for the itching on visual inspection of the skin.  There is no evidence of jaundice.  She does have a history of polycythemia and she states that the itching feels similar to when her blood counts would rise in the past.  At that time, my plan was: Physical exam is normal.  Check CBC to rule out polycythemia.  Check CMP to check for kidney or liver abnormalities that can cause itching.  If normal, I question if this could be from reducing her trazodone and Benadryl, stopping her Cymbalta, starting Trintellix.  The other possibility would be "neurodermatitis/neuropathic itching".  Try hydroxyzine 25 mg every 8 hours as needed for itching or for insomnia to see if this helps and await the results of the lab test.  If the depression is not better in 2 weeks and the itching is not  better, I would recommend stopping Trintellix and resuming Cymbalta but then possibly adding Rexulti to Cymbalta.  08/10/20 Patient recently saw my partner and was clearly manic.  She presents today with her daughter.  She states that since I last saw her, she ordered a $13,000 remodel of her bathroom for no particular reason.  Her daughter also states that she was trying to order solar panels for her home.  This is out of character for the patient.  She was spending money unnecessarily and without good reason.  She also reports trouble sleeping.  The patient was taking trazodone, Benadryl, and Xanax independently without my direction trying to help find a way to go to sleep.  She reports racing  thoughts in her mind wandering at night.  She was also on prednisone at the same time for a sinus infection in addition to taking Adderall for ADD.  She has since abruptly stopped Trintellix.  Although she recently started Trintellix she is also been on Effexor for many years prior so I am concerned about serotonin withdrawal.  She is here today for follow-up.  At that time, my plan was: Although she appears much calmer today, I believe that the patient's symptoms now classify mania.  Therefore I believe that she has bipolar if she is clearly had depression in the past.  Therefore we made several changes today.  First we will stop Adderall.  I do not believe that a stimulant is beneficial to a person having insomnia with manic symptoms.  Her daughter will remove this medication from her box.  Second I do not want her to abruptly discontinue Trintellix.  I am concerned about serotonin withdrawal.  Therefore I have asked her to decrease Trintellix gradually 1st-5 milligrams every other day for a week and then 5 mg every third day for a week and then stop.  She will wean herself off of this medication.  Third I recommended that she stop abusing alcohol.  Apparently she has been drinking an excessive amount of wine in an attempt to try to go to sleep.  I believe that this is only adding to the problem.  I also do not want her taking medications independently at home.  Therefore I recommended that she stop all Benadryl, trazodone, hydroxyzine etc.  Instead I feel that we need to focus on treating bipolar mania.  Therefore I will start her on Abilify 5 mg daily and reassess the patient in 1 week.  She is still on bupropion.  We may want to reassess stopping that medication in the future but I hesitate to make too many changes at one time.  I will also consult psychiatry as soon as possible  08/16/20 Patient seems to be much calmer today.  Her thought process is easy to follow and much more logical.  There is no  tangential speech or pressured speech.  She reports improved depression and improved anxiety however she continues to have difficulty sleeping.  She states that she will lay down at night and read in bed however she will simply toss and turn unable to sleep.  She is taking 1-2 Xanax in the evening with no benefit.  She is now off her stimulant.  She is weaning off Trintellix.  At that time, my plan was: Patient's presentation seems much more stable today.  From the standpoint of train of thought, judgment, and impulsivity, the patient seems to be near her baseline.  Her anxiety and depression also seem to be better however  she still not sleeping.  I will keep Abilify at his current dose 5 mg a day.  I will discontinue Xanax and switch to Klonopin 0.5 mg, 1 to 2 tablets at night as needed insomnia.  11/23/20 Patient is now seeing a psychiatrist.  They do not feel that she has bipolar and they have stopped the Abilify.  They started her on Remeron but the patient stopped this herself due to weight gain.  She is still much calmer than she was before and she is still off her stimulant.  However she presents today concerned that she may have dementia.  She states that she has been noticing memory loss.  For instance, she is been having more trouble finding words.  The other day she was having a discussion with her daughter and she could not think of the name of the type of fabric to used to [employer] a seat that was going outside.  She states that her family members have sent her emails that she has no recollection having received a red despite later finding out that she had.  She denies forgetting conversations that she has had one-on-one with people.  She denies forgetting to pay bills or balance her checkbook's.  She denies forgetting appointments or missing scheduled visits.  However she continues to have flight of ideas consistent with her ADD.  She jumps from 1 topic to the next and or discussion often  interrupting me as I am answering her first question.  I say this not out of disrespect but the point that I think ADD could be playing a role in some of her problems with her short-term memory.  We quit her stimulant last fall due to what I feel was mania.  Since stopping the stimulant, her "memory loss" has gotten worse.  She often interrupts while talking and I believe that some of the problems with short-term memory may be due to lack of concentration and lack of attention.  The patient states that this is possible.  She is also under a lot of stress caring for her husband who has progressive dementia.  I performed a Mini-Mental status exam today with the patient and she did quite well.  She scored 29 out of 30.  The only point that she missed was getting 1 number wrong on serial sevens.  However she quickly spell world in reverse.  Her clock drawing was perfect and showed excellent planning as she spaced 3, 6, 9, 12 first and then drew the correct time.  Recently had a CT scan of her chest to monitor her lung cancer.  It did show coronary artery atherosclerosis in the LAD as well as the left main.  The patient denies any angina but she has stated that she has noticed increasing shortness of breath with activity and becoming more easily winded. Past Medical History:  Diagnosis Date  . ADD (attention deficit disorder)   . Allergic rhinitis   . Anemia   . Anxiety   . Arrhythmia   . Arthritis    "hands, neck, right shoulder" (07/28/2015)  . Aseptic necrosis ([place])   . Asthma dxc'd 05/2015  . Basal cell cancer   . Bleeding stomach ulcer ~ 06/2005  . COPD (chronic obstructive pulmonary disease) ([place])   . Depression   . Diverticula of colon   . Dyspnea   . Esophagitis   . Gastric ulcer with hemorrhage   . GERD (gastroesophageal reflux disease)   . Heart murmur   .  History of blood transfusion 06/2005   "related to pneumonia"  . History of kidney stones   . Hyperlipidemia   . Hypertension   .  Hypothyroidism   . Kidney stones   . Lung cancer ([place])    Non small cell lung cancer, adenocarcinoma with BAC 09/2009, VATS converted to wedge resection of left upper lobe mass  . Mitral valve prolapse    "no ORs" (07/28/2015)  . Osteopenia   . Pneumonia 1970's; 06/2005  . Sleep apnea dx'd 06/2015   on oxygen at nite at 2L/[facility]   . Squamous cell skin cancer    Past Surgical History:  Procedure Laterality Date  . ABDOMINAL HYSTERECTOMY  1988  . COLONOSCOPY WITH PROPOFOL N/A 06/15/2017   Procedure: COLONOSCOPY WITH PROPOFOL;  Surgeon: Carol Ada, MD;  Location: [facility] ENDOSCOPY;  Service: Endoscopy;  Laterality: N/A;  . HAMMER TOE SURGERY Right ~ 2010  . SALPINGOOPHORECTOMY Left 1988  . SKIN CANCER EXCISION     "I've had severl cut off RLE; left foreqrm, nose under nose"  . THORACOTOMY Left 06/2005  . THORACOTOMY/LOBECTOMY  09/2009; 11/2009   left; right   Current Outpatient Medications on File Prior to Visit  Medication Sig Dispense Refill  . acetaminophen (TYLENOL) 325 MG tablet Take by mouth.    Marland Kitchen albuterol (PROVENTIL) (2.5 MG/3ML) 0.083% nebulizer solution USE 1 VIAL (3 ML OR 2.5 MG TOTAL) VIA NEBULIZER EVERY 4 HOURS AS NEEDED FOR WHEEZING OR SHORTNESS OF BREATH 450 mL 3  . albuterol (VENTOLIN HFA) 108 (90 Base) MCG/ACT inhaler Inhale 1 puff into the lungs every 6 (six) hours as needed for wheezing or shortness of breath.    Marland Kitchen buPROPion (WELLBUTRIN XL) 150 MG 24 hr tablet TAKE 1 TABLET DAILY 90 tablet 3  . Calcium Carbonate-Vit D-Min (CALCIUM 1200 PO) Take 50 mg by mouth. ONCE A WEEK    . cholecalciferol (VITAMIN D3) 10 MCG (400 UNIT) TABS tablet Take 400 Units by mouth.    Marland Kitchen CLINPRO 5000 1.1 % PSTE Place onto teeth.    . clonazePAM (KLONOPIN) 0.5 MG tablet Take 1 tablet (0.5 mg total) by mouth at bedtime as needed for anxiety. 30 tablet 1  . co-enzyme Q-10 30 MG capsule Take 100 mg by mouth 3 (three) times daily.    . Cyanocobalamin 1000 MCG CAPS Take by mouth.    . doxazosin  (CARDURA) 4 MG tablet TAKE 1 TABLET DAILY 90 tablet 3  . fluticasone (FLONASE) 50 MCG/ACT nasal spray USE 2 SPRAYS IN EACH NOSTRIL DAILY 48 g 3  . LEVOXYL 112 MCG tablet TAKE 1 TABLET DAILY (DISCONTINUE LEVOTHYROXINE 100) 90 tablet 3  . losartan-hydrochlorothiazide (HYZAAR) 100-25 MG tablet TAKE 1 TABLET DAILY 90 tablet 3  . Multiple Vitamins-Minerals (EMERGEN-C FIVE PO) Take by mouth.    Marland Kitchen omeprazole (PRILOSEC) 20 MG capsule Take 1 capsule (20 mg total) by mouth 2 (two) times daily before a meal. 90 capsule 3  . OXYGEN 2lpm with sleep    . pravastatin (PRAVACHOL) 40 MG tablet TAKE 1 TABLET DAILY 90 tablet 3  . thiamine (VITAMIN B-1) 100 MG tablet Take 100 mg by mouth daily.    . traZODone (DESYREL) 50 MG tablet     . TRELEGY ELLIPTA 100-62.5-25 MCG/INH AEPB USE 1 INHALATION DAILY (DISCONTINUE BEVESPI) 180 each 3  . triamcinolone (KENALOG) 0.1 % Apply 1 application topically 2 (two) times daily. 453 g 0   No current facility-administered medications on file prior to visit.    Allergies  Allergen Reactions  . Amoxicillin     Diarrhea, doesnt tolerate   . Doxycycline Nausea Only  . Erythromycin Nausea Only  . Sulfonamide Derivatives Hives   Social History   Socioeconomic History  . Marital status: Married    Spouse name: Not on file  . Number of children: 3  . Years of education: Not on file  . Highest education level: Not on file  Occupational History  . Occupation: retired Optician, dispensing: RETIRED  Tobacco Use  . Smoking status: Former Smoker    Packs/day: 0.75    Years: 30.00    Pack years: 22.50    Types: Cigarettes    Quit date: 09/11/2004    Years since quitting: 16.2  . Smokeless tobacco: Never Used  Vaping Use  . Vaping Use: Never used  Substance and Sexual Activity  . Alcohol use: Yes    Alcohol/week: 7.0 standard drinks    Types: 7 Shots of liquor per week    Comment: 1-2 drinks nightly   . Drug use: No  . Sexual activity: Yes  Other Topics Concern  . Not  on file  Social History Narrative  . Not on file   Social Determinants of Health   Financial Resource Strain: Not on file  Food Insecurity: Not on file  Transportation Needs: Not on file  Physical Activity: Not on file  Stress: Not on file  Social Connections: Not on file  Intimate Partner Violence: Not on file      Review of Systems  Psychiatric/Behavioral: Positive for depression.  All other systems reviewed and are negative.      Objective:   Physical Exam Vitals reviewed.  Constitutional:      General: She is not in acute distress.    Appearance: Normal appearance. She is normal weight. She is not ill-appearing or toxic-appearing.  Cardiovascular:     Rate and Rhythm: Normal rate and regular rhythm.  Pulmonary:     Effort: Pulmonary effort is normal.     Breath sounds: Normal breath sounds.  Neurological:     Mental Status: She is alert.           Assessment & Plan:  Coronary artery calcification seen on CAT scan  Memory loss  I do not feel that she has Alzheimer's disease or dementia.  Instead I feel that she has some age-related cognitive decline coupled with untreated ADD coupled with increasing stress manifesting itself and some mild memory loss.  We spent the majority of her time discussing this and also going over the CAT scan.  I explained to the patient that it is impossible to evaluate the extent of the blockages due to the calcification seen on the CT scan.  I would recommend a stress test with cardiology based on symptoms.  She denies any angina but she does report dyspnea on exertion.  Therefore I will consult cardiology regarding a possible stress test she does have underlying risk factors including age and hypertension

## 2020-11-26 DIAGNOSIS — M25552 Pain in left hip: Secondary | ICD-10-CM | POA: Diagnosis not present

## 2020-11-26 DIAGNOSIS — R269 Unspecified abnormalities of gait and mobility: Secondary | ICD-10-CM | POA: Diagnosis not present

## 2020-11-26 DIAGNOSIS — M25652 Stiffness of left hip, not elsewhere classified: Secondary | ICD-10-CM | POA: Diagnosis not present

## 2020-11-26 DIAGNOSIS — M62552 Muscle wasting and atrophy, not elsewhere classified, left thigh: Secondary | ICD-10-CM | POA: Diagnosis not present

## 2020-11-29 ENCOUNTER — Encounter: Payer: Self-pay | Admitting: Family Medicine

## 2020-11-29 DIAGNOSIS — M62552 Muscle wasting and atrophy, not elsewhere classified, left thigh: Secondary | ICD-10-CM | POA: Diagnosis not present

## 2020-11-29 DIAGNOSIS — M25552 Pain in left hip: Secondary | ICD-10-CM | POA: Diagnosis not present

## 2020-11-29 DIAGNOSIS — R269 Unspecified abnormalities of gait and mobility: Secondary | ICD-10-CM | POA: Diagnosis not present

## 2020-11-29 DIAGNOSIS — M25652 Stiffness of left hip, not elsewhere classified: Secondary | ICD-10-CM | POA: Diagnosis not present

## 2020-11-30 ENCOUNTER — Other Ambulatory Visit: Payer: Self-pay | Admitting: Family Medicine

## 2020-11-30 DIAGNOSIS — R269 Unspecified abnormalities of gait and mobility: Secondary | ICD-10-CM | POA: Diagnosis not present

## 2020-11-30 DIAGNOSIS — M62552 Muscle wasting and atrophy, not elsewhere classified, left thigh: Secondary | ICD-10-CM | POA: Diagnosis not present

## 2020-11-30 DIAGNOSIS — M25652 Stiffness of left hip, not elsewhere classified: Secondary | ICD-10-CM | POA: Diagnosis not present

## 2020-11-30 DIAGNOSIS — M25552 Pain in left hip: Secondary | ICD-10-CM | POA: Diagnosis not present

## 2020-11-30 MED ORDER — HYDROCODONE-ACETAMINOPHEN 5-325 MG PO TABS: 1.0000 | ORAL_TABLET | Freq: Four times a day (QID) | ORAL | 0 refills | Status: DC | PRN

## 2020-12-03 DIAGNOSIS — R269 Unspecified abnormalities of gait and mobility: Secondary | ICD-10-CM | POA: Diagnosis not present

## 2020-12-03 DIAGNOSIS — M62552 Muscle wasting and atrophy, not elsewhere classified, left thigh: Secondary | ICD-10-CM | POA: Diagnosis not present

## 2020-12-03 DIAGNOSIS — M25652 Stiffness of left hip, not elsewhere classified: Secondary | ICD-10-CM | POA: Diagnosis not present

## 2020-12-03 DIAGNOSIS — M25552 Pain in left hip: Secondary | ICD-10-CM | POA: Diagnosis not present

## 2020-12-06 DIAGNOSIS — M25552 Pain in left hip: Secondary | ICD-10-CM | POA: Diagnosis not present

## 2020-12-06 DIAGNOSIS — M62552 Muscle wasting and atrophy, not elsewhere classified, left thigh: Secondary | ICD-10-CM | POA: Diagnosis not present

## 2020-12-06 DIAGNOSIS — M25652 Stiffness of left hip, not elsewhere classified: Secondary | ICD-10-CM | POA: Diagnosis not present

## 2020-12-06 DIAGNOSIS — R269 Unspecified abnormalities of gait and mobility: Secondary | ICD-10-CM | POA: Diagnosis not present

## 2020-12-08 ENCOUNTER — Encounter: Payer: Self-pay | Admitting: Family Medicine

## 2020-12-08 DIAGNOSIS — R269 Unspecified abnormalities of gait and mobility: Secondary | ICD-10-CM | POA: Diagnosis not present

## 2020-12-08 DIAGNOSIS — M62552 Muscle wasting and atrophy, not elsewhere classified, left thigh: Secondary | ICD-10-CM | POA: Diagnosis not present

## 2020-12-08 DIAGNOSIS — M25652 Stiffness of left hip, not elsewhere classified: Secondary | ICD-10-CM | POA: Diagnosis not present

## 2020-12-08 DIAGNOSIS — M25552 Pain in left hip: Secondary | ICD-10-CM | POA: Diagnosis not present

## 2020-12-10 DIAGNOSIS — M62552 Muscle wasting and atrophy, not elsewhere classified, left thigh: Secondary | ICD-10-CM | POA: Diagnosis not present

## 2020-12-10 DIAGNOSIS — M25652 Stiffness of left hip, not elsewhere classified: Secondary | ICD-10-CM | POA: Diagnosis not present

## 2020-12-10 DIAGNOSIS — R269 Unspecified abnormalities of gait and mobility: Secondary | ICD-10-CM | POA: Diagnosis not present

## 2020-12-10 DIAGNOSIS — M25552 Pain in left hip: Secondary | ICD-10-CM | POA: Diagnosis not present

## 2020-12-13 ENCOUNTER — Encounter: Payer: Self-pay | Admitting: Family Medicine

## 2020-12-13 ENCOUNTER — Ambulatory Visit (INDEPENDENT_AMBULATORY_CARE_PROVIDER_SITE_OTHER): Payer: Medicare Other | Admitting: Family Medicine

## 2020-12-13 ENCOUNTER — Other Ambulatory Visit: Payer: Self-pay

## 2020-12-13 VITALS — BP 130/80 | HR 78 | Temp 98.6°F | Resp 14 | Ht 67.0 in | Wt 184.0 lb

## 2020-12-13 DIAGNOSIS — I2510 Atherosclerotic heart disease of native coronary artery without angina pectoris: Secondary | ICD-10-CM | POA: Diagnosis not present

## 2020-12-13 DIAGNOSIS — F330 Major depressive disorder, recurrent, mild: Secondary | ICD-10-CM | POA: Diagnosis not present

## 2020-12-13 MED ORDER — DULOXETINE HCL 60 MG PO CPEP: 60.0000 mg | ORAL_CAPSULE | Freq: Every day | ORAL | 3 refills | Status: DC

## 2020-12-13 NOTE — Progress Notes (Signed)
Subjective:    Patient ID: [name], female    DOB: October 21, 1940, 80 y.o.   MRN: [id number]  Depression        06/29/20 Patient reports that she feels depressed.  She has been on Cymbalta and bupropion for quite some time.  However for the last several months, she has lost hope.  She states that she has no desire.  She simply sits around her home and has no motivation to leave.  She has no motivation exercise.  All she does is sit in her chair and watch TV.  She is under tremendous stress caring for her husband who has MS as well as dementia.  As result of the stress and the depression she reports memory loss and trouble focusing.  She is having a hard time thinking of words that she wants to say.  She states that she is having trouble sleeping.  Therefore she is self-medicating.  She is taking 2 trazodone 50 mg tablets at night and 100 mg of Benadryl.  I immediately told the patient to stop doing this due to anticholinergic side effects.  Instead I want her to use Xanax 0.5 mg p.o. nightly until we have the depression under control.  She denies any manic thoughts.  She denies any delusions or hallucinations or suicidal ideation or homicidal ideation.  She does report anhedonia and depression and feeling sad.  At that time, my plan was: I believe the stress of caring for her husband with dementia, the loss of her son earlier this year, and her general declining health have all contributed to worsen her depression.  Discontinue Cymbalta as the patient has been on this for many years and start Trintellix 10 mg daily.  Recheck here in 3 weeks or sooner if worsening.  Stop using trazodone and Benadryl at night.  Explained the risk of anticholinergic side effects.  Instead temporarily use Xanax at night to help her self sleep.  See the patient back in 3 weeks.  If this is not working, could consider supplementing with Green Grass or Abilify.  07/15/20 Patient is not sure that she seen any benefit  since switching from Cymbalta to Trintellix.  However she continues to have trouble sleeping at night.  She states that she wakes up about every day at 3 AM unable to sleep more.  She also reports itching all over her body.  She is scratching and clawing at the skin of the dorsums of her forearms as well as on her anterior shins bilaterally.  There is no visible rash.  There is no erythema.  There is no excoriations.  There is no explanation for the itching on visual inspection of the skin.  There is no evidence of jaundice.  She does have a history of polycythemia and she states that the itching feels similar to when her blood counts would rise in the past.  At that time, my plan was: Physical exam is normal.  Check CBC to rule out polycythemia.  Check CMP to check for kidney or liver abnormalities that can cause itching.  If normal, I question if this could be from reducing her trazodone and Benadryl, stopping her Cymbalta, starting Trintellix.  The other possibility would be "neurodermatitis/neuropathic itching".  Try hydroxyzine 25 mg every 8 hours as needed for itching or for insomnia to see if this helps and await the results of the lab test.  If the depression is not better in 2 weeks and the itching is not  better, I would recommend stopping Trintellix and resuming Cymbalta but then possibly adding Rexulti to Cymbalta.  08/10/20 Patient recently saw my partner and was clearly manic.  She presents today with her daughter.  She states that since I last saw her, she ordered a $13,000 remodel of her bathroom for no particular reason.  Her daughter also states that she was trying to order solar panels for her home.  This is out of character for the patient.  She was spending money unnecessarily and without good reason.  She also reports trouble sleeping.  The patient was taking trazodone, Benadryl, and Xanax independently without my direction trying to help find a way to go to sleep.  She reports racing  thoughts in her mind wandering at night.  She was also on prednisone at the same time for a sinus infection in addition to taking Adderall for ADD.  She has since abruptly stopped Trintellix.  Although she recently started Trintellix she is also been on Effexor for many years prior so I am concerned about serotonin withdrawal.  She is here today for follow-up.  At that time, my plan was: Although she appears much calmer today, I believe that the patient's symptoms now classify mania.  Therefore I believe that she has bipolar if she is clearly had depression in the past.  Therefore we made several changes today.  First we will stop Adderall.  I do not believe that a stimulant is beneficial to a person having insomnia with manic symptoms.  Her daughter will remove this medication from her box.  Second I do not want her to abruptly discontinue Trintellix.  I am concerned about serotonin withdrawal.  Therefore I have asked her to decrease Trintellix gradually 1st-5 milligrams every other day for a week and then 5 mg every third day for a week and then stop.  She will wean herself off of this medication.  Third I recommended that she stop abusing alcohol.  Apparently she has been drinking an excessive amount of wine in an attempt to try to go to sleep.  I believe that this is only adding to the problem.  I also do not want her taking medications independently at home.  Therefore I recommended that she stop all Benadryl, trazodone, hydroxyzine etc.  Instead I feel that we need to focus on treating bipolar mania.  Therefore I will start her on Abilify 5 mg daily and reassess the patient in 1 week.  She is still on bupropion.  We may want to reassess stopping that medication in the future but I hesitate to make too many changes at one time.  I will also consult psychiatry as soon as possible  08/16/20 Patient seems to be much calmer today.  Her thought process is easy to follow and much more logical.  There is no  tangential speech or pressured speech.  She reports improved depression and improved anxiety however she continues to have difficulty sleeping.  She states that she will lay down at night and read in bed however she will simply toss and turn unable to sleep.  She is taking 1-2 Xanax in the evening with no benefit.  She is now off her stimulant.  She is weaning off Trintellix.  At that time, my plan was: Patient's presentation seems much more stable today.  From the standpoint of train of thought, judgment, and impulsivity, the patient seems to be near her baseline.  Her anxiety and depression also seem to be better however  she still not sleeping.  I will keep Abilify at his current dose 5 mg a day.  I will discontinue Xanax and switch to Klonopin 0.5 mg, 1 to 2 tablets at night as needed insomnia.   12/13/20 Patient saw psychiatry who did not feel that she had bipolar.  They weaned her off the Abilify and started her on mirtazapine.  She did not like the way mirtazapine made her feel and she did not follow-up with psychiatry.  She did not make a follow-up appointment.  She now reports depression.  She states that she has anhedonia.  She is overwhelmed with stress caring for her husband who has advanced dementia.  She has anxiety stemming from this as he is argumentative and she frequently has to deal with his complaints and somewhat belligerent attitude.  She reports anhedonia.  She reports decreasing energy.  She reports feeling sad.  She denies any racing thoughts.  She denies any delusions.  She denies any suicidal ideation.  In the past she took Cymbalta for several years without any issue  Past Medical History:  Diagnosis Date  . ADD (attention deficit disorder)   . Allergic rhinitis   . Anemia   . Anxiety   . Arrhythmia   . Arthritis    "hands, neck, right shoulder" (07/28/2015)  . Aseptic necrosis ([place])   . Asthma dxc'd 05/2015  . Basal cell cancer   . Bleeding stomach ulcer ~ 06/2005  . COPD  (chronic obstructive pulmonary disease) ([place])   . Depression   . Diverticula of colon   . Dyspnea   . Esophagitis   . Gastric ulcer with hemorrhage   . GERD (gastroesophageal reflux disease)   . Heart murmur   . History of blood transfusion 06/2005   "related to pneumonia"  . History of kidney stones   . Hyperlipidemia   . Hypertension   . Hypothyroidism   . Kidney stones   . Lung cancer ([place])    Non small cell lung cancer, adenocarcinoma with BAC 09/2009, VATS converted to wedge resection of left upper lobe mass  . Mitral valve prolapse    "no ORs" (07/28/2015)  . Osteopenia   . Pneumonia 1970's; 06/2005  . Sleep apnea dx'd 06/2015   on oxygen at nite at 2L/[place]   . Squamous cell skin cancer    Past Surgical History:  Procedure Laterality Date  . ABDOMINAL HYSTERECTOMY  1988  . COLONOSCOPY WITH PROPOFOL N/A 06/15/2017   Procedure: COLONOSCOPY WITH PROPOFOL;  Surgeon: Carol Ada, MD;  Location: [facility];  Service: Endoscopy;  Laterality: N/A;  . HAMMER TOE SURGERY Right ~ 2010  . SALPINGOOPHORECTOMY Left 1988  . SKIN CANCER EXCISION     "I've had severl cut off RLE; left foreqrm, nose under nose"  . THORACOTOMY Left 06/2005  . THORACOTOMY/LOBECTOMY  09/2009; 11/2009   left; right   Current Outpatient Medications on File Prior to Visit  Medication Sig Dispense Refill  . acetaminophen (TYLENOL) 325 MG tablet Take by mouth.    Marland Kitchen albuterol (VENTOLIN HFA) 108 (90 Base) MCG/ACT inhaler Inhale 1 puff into the lungs every 6 (six) hours as needed for wheezing or shortness of breath.    Marland Kitchen buPROPion (WELLBUTRIN XL) 150 MG 24 hr tablet TAKE 1 TABLET DAILY 90 tablet 3  . Calcium Carbonate-Vit D-Min (CALCIUM 1200 PO) Take 50 mg by mouth. ONCE A WEEK    . CLINPRO 5000 1.1 % PSTE Place onto teeth.    . co-enzyme Q-10  30 MG capsule Take 100 mg by mouth 3 (three) times daily.    . Cyanocobalamin 1000 MCG CAPS Take by mouth.    . doxazosin (CARDURA) 4 MG tablet TAKE 1 TABLET DAILY 90  tablet 3  . fluticasone (FLONASE) 50 MCG/ACT nasal spray USE 2 SPRAYS IN EACH NOSTRIL DAILY 48 g 3  . HYDROcodone-acetaminophen (NORCO) 5-325 MG tablet Take 1 tablet by mouth every 6 (six) hours as needed for moderate pain. 30 tablet 0  . LEVOXYL 112 MCG tablet TAKE 1 TABLET DAILY (DISCONTINUE LEVOTHYROXINE 100) 90 tablet 3  . losartan-hydrochlorothiazide (HYZAAR) 100-25 MG tablet TAKE 1 TABLET DAILY 90 tablet 3  . Multiple Vitamins-Minerals (EMERGEN-C FIVE PO) Take by mouth.    Marland Kitchen omeprazole (PRILOSEC) 20 MG capsule Take 1 capsule (20 mg total) by mouth 2 (two) times daily before a meal. 90 capsule 3  . OXYGEN 2lpm with sleep    . pravastatin (PRAVACHOL) 40 MG tablet TAKE 1 TABLET DAILY 90 tablet 3  . thiamine (VITAMIN B-1) 100 MG tablet Take 100 mg by mouth daily.    . traZODone (DESYREL) 50 MG tablet     . TRELEGY ELLIPTA 100-62.5-25 MCG/INH AEPB USE 1 INHALATION DAILY (DISCONTINUE BEVESPI) 180 each 3  . triamcinolone (KENALOG) 0.1 % Apply 1 application topically 2 (two) times daily. 453 g 0   No current facility-administered medications on file prior to visit.    Allergies  Allergen Reactions  . Amoxicillin     Diarrhea, doesnt tolerate   . Doxycycline Nausea Only  . Erythromycin Nausea Only  . Sulfonamide Derivatives Hives   Social History   Socioeconomic History  . Marital status: Married    Spouse name: Not on file  . Number of children: 3  . Years of education: Not on file  . Highest education level: Not on file  Occupational History  . Occupation: retired Optician, dispensing: RETIRED  Tobacco Use  . Smoking status: Former Smoker    Packs/day: 0.75    Years: 30.00    Pack years: 22.50    Types: Cigarettes    Quit date: 09/11/2004    Years since quitting: 16.2  . Smokeless tobacco: Never Used  Vaping Use  . Vaping Use: Never used  Substance and Sexual Activity  . Alcohol use: Yes    Alcohol/week: 7.0 standard drinks    Types: 7 Shots of liquor per week    Comment:  1-2 drinks nightly   . Drug use: No  . Sexual activity: Yes  Other Topics Concern  . Not on file  Social History Narrative  . Not on file   Social Determinants of Health   Financial Resource Strain: Not on file  Food Insecurity: Not on file  Transportation Needs: Not on file  Physical Activity: Not on file  Stress: Not on file  Social Connections: Not on file  Intimate Partner Violence: Not on file      Review of Systems  Psychiatric/Behavioral: Positive for depression.  All other systems reviewed and are negative.      Objective:   Physical Exam Vitals reviewed.  Constitutional:      General: She is not in acute distress.    Appearance: Normal appearance. She is normal weight. She is not ill-appearing or toxic-appearing.  Cardiovascular:     Rate and Rhythm: Normal rate and regular rhythm.  Pulmonary:     Effort: Pulmonary effort is normal.     Breath sounds: Normal breath sounds.  Neurological:     Mental Status: She is alert.           Assessment & Plan:  Mild episode of recurrent major depressive disorder (HCC)  Resume Cymbalta 60 mg a day and then reassess in 4 to 6 weeks

## 2020-12-15 DIAGNOSIS — M62552 Muscle wasting and atrophy, not elsewhere classified, left thigh: Secondary | ICD-10-CM | POA: Diagnosis not present

## 2020-12-15 DIAGNOSIS — M25652 Stiffness of left hip, not elsewhere classified: Secondary | ICD-10-CM | POA: Diagnosis not present

## 2020-12-15 DIAGNOSIS — R269 Unspecified abnormalities of gait and mobility: Secondary | ICD-10-CM | POA: Diagnosis not present

## 2020-12-15 DIAGNOSIS — M25552 Pain in left hip: Secondary | ICD-10-CM | POA: Diagnosis not present

## 2020-12-17 DIAGNOSIS — M25552 Pain in left hip: Secondary | ICD-10-CM | POA: Diagnosis not present

## 2020-12-17 DIAGNOSIS — R269 Unspecified abnormalities of gait and mobility: Secondary | ICD-10-CM | POA: Diagnosis not present

## 2020-12-17 DIAGNOSIS — M25652 Stiffness of left hip, not elsewhere classified: Secondary | ICD-10-CM | POA: Diagnosis not present

## 2020-12-17 DIAGNOSIS — M62552 Muscle wasting and atrophy, not elsewhere classified, left thigh: Secondary | ICD-10-CM | POA: Diagnosis not present

## 2020-12-20 DIAGNOSIS — M25552 Pain in left hip: Secondary | ICD-10-CM | POA: Diagnosis not present

## 2020-12-20 DIAGNOSIS — M62552 Muscle wasting and atrophy, not elsewhere classified, left thigh: Secondary | ICD-10-CM | POA: Diagnosis not present

## 2020-12-20 DIAGNOSIS — R269 Unspecified abnormalities of gait and mobility: Secondary | ICD-10-CM | POA: Diagnosis not present

## 2020-12-20 DIAGNOSIS — M25652 Stiffness of left hip, not elsewhere classified: Secondary | ICD-10-CM | POA: Diagnosis not present

## 2020-12-22 DIAGNOSIS — M25552 Pain in left hip: Secondary | ICD-10-CM | POA: Diagnosis not present

## 2020-12-22 DIAGNOSIS — R269 Unspecified abnormalities of gait and mobility: Secondary | ICD-10-CM | POA: Diagnosis not present

## 2020-12-22 DIAGNOSIS — M62552 Muscle wasting and atrophy, not elsewhere classified, left thigh: Secondary | ICD-10-CM | POA: Diagnosis not present

## 2020-12-22 DIAGNOSIS — M25652 Stiffness of left hip, not elsewhere classified: Secondary | ICD-10-CM | POA: Diagnosis not present

## 2020-12-28 DIAGNOSIS — R269 Unspecified abnormalities of gait and mobility: Secondary | ICD-10-CM | POA: Diagnosis not present

## 2020-12-28 DIAGNOSIS — M25652 Stiffness of left hip, not elsewhere classified: Secondary | ICD-10-CM | POA: Diagnosis not present

## 2020-12-28 DIAGNOSIS — M25552 Pain in left hip: Secondary | ICD-10-CM | POA: Diagnosis not present

## 2020-12-28 DIAGNOSIS — M62552 Muscle wasting and atrophy, not elsewhere classified, left thigh: Secondary | ICD-10-CM | POA: Diagnosis not present

## 2020-12-31 DIAGNOSIS — M25652 Stiffness of left hip, not elsewhere classified: Secondary | ICD-10-CM | POA: Diagnosis not present

## 2020-12-31 DIAGNOSIS — M25552 Pain in left hip: Secondary | ICD-10-CM | POA: Diagnosis not present

## 2020-12-31 DIAGNOSIS — M62552 Muscle wasting and atrophy, not elsewhere classified, left thigh: Secondary | ICD-10-CM | POA: Diagnosis not present

## 2020-12-31 DIAGNOSIS — R269 Unspecified abnormalities of gait and mobility: Secondary | ICD-10-CM | POA: Diagnosis not present

## 2021-01-03 ENCOUNTER — Other Ambulatory Visit: Payer: Self-pay | Admitting: Family Medicine

## 2021-01-03 DIAGNOSIS — M25552 Pain in left hip: Secondary | ICD-10-CM | POA: Diagnosis not present

## 2021-01-03 DIAGNOSIS — R269 Unspecified abnormalities of gait and mobility: Secondary | ICD-10-CM | POA: Diagnosis not present

## 2021-01-03 DIAGNOSIS — M25652 Stiffness of left hip, not elsewhere classified: Secondary | ICD-10-CM | POA: Diagnosis not present

## 2021-01-03 DIAGNOSIS — M62552 Muscle wasting and atrophy, not elsewhere classified, left thigh: Secondary | ICD-10-CM | POA: Diagnosis not present

## 2021-01-06 ENCOUNTER — Other Ambulatory Visit: Payer: Self-pay | Admitting: Family Medicine

## 2021-01-06 ENCOUNTER — Encounter: Payer: Self-pay | Admitting: Family Medicine

## 2021-01-07 ENCOUNTER — Other Ambulatory Visit: Payer: Self-pay | Admitting: Family Medicine

## 2021-01-07 MED ORDER — ALPRAZOLAM 0.5 MG PO TABS
0.5000 mg | ORAL_TABLET | Freq: Every evening | ORAL | 0 refills | Status: DC | PRN
Start: 2021-01-07 — End: 2021-02-08

## 2021-01-20 ENCOUNTER — Other Ambulatory Visit: Payer: Self-pay | Admitting: Family Medicine

## 2021-01-21 ENCOUNTER — Other Ambulatory Visit: Payer: Self-pay

## 2021-01-21 ENCOUNTER — Ambulatory Visit (INDEPENDENT_AMBULATORY_CARE_PROVIDER_SITE_OTHER): Payer: Medicare Other | Admitting: Cardiology

## 2021-01-21 VITALS — BP 124/72 | HR 95 | Ht 67.0 in | Wt 184.4 lb

## 2021-01-21 DIAGNOSIS — I2510 Atherosclerotic heart disease of native coronary artery without angina pectoris: Secondary | ICD-10-CM | POA: Diagnosis not present

## 2021-01-21 DIAGNOSIS — I10 Essential (primary) hypertension: Secondary | ICD-10-CM | POA: Diagnosis not present

## 2021-01-21 DIAGNOSIS — E785 Hyperlipidemia, unspecified: Secondary | ICD-10-CM

## 2021-01-21 NOTE — Addendum Note (Signed)
Addended by: Antonieta Iba on: 01/21/2021 01:53 PM   Modules accepted: Orders

## 2021-01-21 NOTE — Patient Instructions (Signed)
Medication Instructions:  Your physician recommends that you continue on your current medications as directed. Please refer to the Current Medication list given to you today.  *If you need a refill on your cardiac medications before your next appointment, please call your pharmacy*  Lab Work: Fasting lipids and ALT on the same day as your stress test  Testing/Procedures: Your physician has requested that you have a lexiscan myoview. For further information please visit [email or web address]. Please follow instruction sheet, as given.   Follow-Up: At [facility], you and your health needs are our priority.  As part of our continuing mission to provide you with exceptional heart care, we have created designated Provider Care Teams.  These Care Teams include your primary Cardiologist (physician) and Advanced Practice Providers (APPs -  Physician Assistants and Nurse Practitioners) who all work together to provide you with the care you need, when you need it.  Your next appointment:   1 year(s)  The format for your next appointment:   In Person  Provider:   You may see Fransico Him, MD or one of the following Advanced Practice Providers on your designated Care Team:    Melina Copa, PA-C  Ermalinda Barrios, PA-C

## 2021-01-21 NOTE — Progress Notes (Signed)
Cardiology Consult Note    Date:  01/21/2021   ID:  [name], DOB 09-29-1940, MRN [id number]  PCP:  Susy Frizzle, MD  Cardiologist:  Fransico Him, MD   Chief Complaint  Patient presents with  . New Patient (Initial Visit)    Coronary artery calcifications, HTN, HLD    History of Present Illness:  [name] is a 80 y.o. female who is being seen today for the evaluation of coronary artery calcifications noted on chest CT at the request of Susy Frizzle, MD.  This is a 80yo female with a hx of ADD, anxiety, COPD, lung CA, HTN, GERD, HLD and OSA on O2 at night who recently had a staging CT for lung CA and was noted to have aortic atherosclerosis and atherosclerosis of the coronary arteries in the LM and LAD.  Her PCP has now referred her here for further evaluation.  She denies any chest pain or pressure,  PND, orthopnea, LE edema, dizziness, palpitations or syncope. She has chronic DOE that is related to her COPD and lung CA and on O2 at night for her OSA.  Past Medical History:  Diagnosis Date  . ADD (attention deficit disorder)   . Allergic rhinitis   . Anemia   . Anxiety   . Arrhythmia   . Arthritis    "hands, neck, right shoulder" (07/28/2015)  . Aseptic necrosis ([place])   . Asthma dxc'd 05/2015  . Basal cell cancer   . Bleeding stomach ulcer ~ 06/2005  . COPD (chronic obstructive pulmonary disease) ([place])   . Depression   . Diverticula of colon   . Dyspnea   . Esophagitis   . Gastric ulcer with hemorrhage   . GERD (gastroesophageal reflux disease)   . Heart murmur   . History of blood transfusion 06/2005   "related to pneumonia"  . History of kidney stones   . Hyperlipidemia   . Hypertension   . Hypothyroidism   . Kidney stones   . Lung cancer ([place])    Non small cell lung cancer, adenocarcinoma with BAC 09/2009, VATS converted to wedge resection of left upper lobe mass  . Mitral valve prolapse    "no ORs" (07/28/2015)  .  Osteopenia   . Pneumonia 1970's; 06/2005  . Sleep apnea dx'd 06/2015   on oxygen at nite at 2L/[place]   . Squamous cell skin cancer     Past Surgical History:  Procedure Laterality Date  . ABDOMINAL HYSTERECTOMY  1988  . COLONOSCOPY WITH PROPOFOL N/A 06/15/2017   Procedure: COLONOSCOPY WITH PROPOFOL;  Surgeon: Carol Ada, MD;  Location: [facility];  Service: Endoscopy;  Laterality: N/A;  . HAMMER TOE SURGERY Right ~ 2010  . SALPINGOOPHORECTOMY Left 1988  . SKIN CANCER EXCISION     "I've had severl cut off RLE; left foreqrm, nose under nose"  . THORACOTOMY Left 06/2005  . THORACOTOMY/LOBECTOMY  09/2009; 11/2009   left; right    Current Medications: Current Meds  Medication Sig  . acetaminophen (TYLENOL) 325 MG tablet Take by mouth.  Marland Kitchen albuterol (VENTOLIN HFA) 108 (90 Base) MCG/ACT inhaler Inhale 1 puff into the lungs every 6 (six) hours as needed for wheezing or shortness of breath.  . ALPRAZolam (XANAX) 0.5 MG tablet Take 1 tablet (0.5 mg total) by mouth at bedtime as needed for sleep.  Marland Kitchen buPROPion (WELLBUTRIN XL) 150 MG 24 hr tablet TAKE 1 TABLET DAILY  . Calcium Carbonate-Vit D-Min (CALCIUM 1200 PO) Take 50 mg  by mouth. ONCE A WEEK  . CLINPRO 5000 1.1 % PSTE Place onto teeth.  . co-enzyme Q-10 30 MG capsule Take 100 mg by mouth 3 (three) times daily.  . Cyanocobalamin 1000 MCG CAPS Take by mouth.  . doxazosin (CARDURA) 4 MG tablet TAKE 1 TABLET DAILY  . DULoxetine (CYMBALTA) 60 MG capsule Take 1 capsule (60 mg total) by mouth daily.  . fluticasone (FLONASE) 50 MCG/ACT nasal spray USE 2 SPRAYS IN EACH NOSTRIL DAILY  . LEVOXYL 112 MCG tablet TAKE 1 TABLET DAILY (DISCONTINUE LEVOTHYROXINE 100)  . losartan-hydrochlorothiazide (HYZAAR) 100-25 MG tablet TAKE 1 TABLET DAILY  . Multiple Vitamins-Minerals (EMERGEN-C FIVE PO) Take by mouth.  Marland Kitchen omeprazole (PRILOSEC) 20 MG capsule Take 1 capsule (20 mg total) by mouth 2 (two) times daily before a meal.  . OXYGEN 2lpm with sleep  .  pravastatin (PRAVACHOL) 40 MG tablet TAKE 1 TABLET DAILY  . thiamine (VITAMIN B-1) 100 MG tablet Take 100 mg by mouth daily.  . traZODone (DESYREL) 50 MG tablet   . TRELEGY ELLIPTA 100-62.5-25 MCG/INH AEPB USE 1 INHALATION DAILY (DISCONTINUE BEVESPI)  . triamcinolone (KENALOG) 0.1 % Apply 1 application topically 2 (two) times daily.    Allergies:   Amoxicillin, Doxycycline, Erythromycin, and Sulfonamide derivatives   Social History   Socioeconomic History  . Marital status: Married    Spouse name: Not on file  . Number of children: 3  . Years of education: Not on file  . Highest education level: Not on file  Occupational History  . Occupation: retired Optician, dispensing: RETIRED  Tobacco Use  . Smoking status: Former Smoker    Packs/day: 0.75    Years: 30.00    Pack years: 22.50    Types: Cigarettes    Quit date: 09/11/2004    Years since quitting: 16.3  . Smokeless tobacco: Never Used  Vaping Use  . Vaping Use: Never used  Substance and Sexual Activity  . Alcohol use: Yes    Alcohol/week: 7.0 standard drinks    Types: 7 Shots of liquor per week    Comment: 1-2 drinks nightly   . Drug use: No  . Sexual activity: Yes  Other Topics Concern  . Not on file  Social History Narrative  . Not on file   Social Determinants of Health   Financial Resource Strain: Not on file  Food Insecurity: Not on file  Transportation Needs: Not on file  Physical Activity: Not on file  Stress: Not on file  Social Connections: Not on file     Family History:  The patient's family history includes Arthritis in her maternal grandmother; Breast cancer in her maternal aunt; Berenice Primas' disease in her daughter; Heart disease in her maternal aunt; Hypertension in her mother.   ROS:   Please see the history of present illness.    ROS All other systems reviewed and are negative.  No flowsheet data found.  PHYSICAL EXAM:   VS:  BP 124/72   Pulse 95   Ht 5\' 7"  (1.702 m)   Wt 184 lb 6.4 oz (83.6  kg)   SpO2 96%   BMI 28.88 kg/m    GEN: Well nourished, well developed, in no acute distress  HEENT: normal  Neck: no JVD, carotid bruits, or masses Cardiac: RRR; no murmurs, rubs, or gallops,no edema.  Intact distal pulses bilaterally.  Respiratory:  clear to auscultation bilaterally, normal work of breathing GI: soft, nontender, nondistended, + BS MS: no deformity or atrophy  Skin: warm and dry, no rash Neuro:  Alert and Oriented x 3, Strength and sensation are intact Psych: euthymic mood, full affect  Wt Readings from Last 3 Encounters:  01/21/21 184 lb 6.4 oz (83.6 kg)  12/13/20 184 lb (83.5 kg)  11/23/20 187 lb (84.8 kg)      Studies/Labs Reviewed:   EKG:  EKG is ordered today.  The ekg ordered today demonstrates NSR at 95bpm with inferior infarct age undetermined  Recent Labs: 11/05/2020: ALT 15; BUN 16; Creatinine 0.76; Hemoglobin 14.4; Platelet Count 268; Potassium 4.3; Sodium 141   Lipid Panel    Component Value Date/Time   CHOL 221 (H) 05/21/2018 1616   TRIG 201 (H) 05/21/2018 1616   HDL 65 05/21/2018 1616   CHOLHDL 3.4 05/21/2018 1616   VLDL 41 (H) 11/03/2016 1136   LDLCALC 124 (H) 05/21/2018 1616     Additional studies/ records that were reviewed today include:  OV notes from PCP, chest CT    ASSESSMENT:    1. Coronary artery calcification seen on CAT scan   2. Hyperlipidemia LDL goal <70   3. Primary hypertension      PLAN:  In order of problems listed above:  1..Coronary artery calcifications -noted on Chest CT done for staging for lung CA -coronary atherosclerosis was noted in the LAD and LM -she denies any chest pain or DOE -EKG shows ? Old inferior infarct -I will get a Lexiscan myoview to rule out ischemia -continue statin -Shared Decision Making/Informed Consent The risks [chest pain, shortness of breath, cardiac arrhythmias, dizziness, blood pressure fluctuations, myocardial infarction, stroke/transient ischemic attack, nausea,  vomiting, allergic reaction, radiation exposure, metallic taste sensation and life-threatening complications (estimated to be 1 in 10,000)], benefits (risk stratification, diagnosing coronary artery disease, treatment guidance) and alternatives of a nuclear stress test were discussed in detail with Ms. Berens and she agrees to proceed.  2.  HLD -LDL goal is < 70 -she has not had her lipids done since 2019 so I will order an FLP and ALT -continue prescription drug management with Pravastatin 40mg  daily  3.  HTN -BP is adequately controlled on exam today -continue Cardura 4mg  daily, Hyzaar 100-25mg  daily -I have personally reviewed the labs done by her PCP in Feb 2022 showing a stable SCr at 0.76 and K+ 4.3  Followup with me in 1 year   Medication Adjustments/Labs and Tests Ordered: Current medicines are reviewed at length with the patient today.  Concerns regarding medicines are outlined above.  Medication changes, Labs and Tests ordered today are listed in the Patient Instructions below.  There are no Patient Instructions on file for this visit.   Signed, Fransico Him, MD  01/21/2021 1:44 PM    [facility] [address], [place], [place]  [address] Phone: [phone]; Fax: [phone]

## 2021-01-22 NOTE — Addendum Note (Signed)
Addended by: Fransico Him R on: 01/22/2021 02:09 PM   Modules accepted: Orders

## 2021-02-02 ENCOUNTER — Telehealth (HOSPITAL_COMMUNITY): Payer: Self-pay | Admitting: *Deleted

## 2021-02-02 NOTE — Telephone Encounter (Signed)
Patient given detailed instructions per Myocardial Perfusion Study Information Sheet for the test on 02/08/2021 at 10:30. Patient notified to arrive 15 minutes early and that it is imperative to arrive on time for appointment to keep from having the test rescheduled.  If you need to cancel or reschedule your appointment, please call the office within 24 hours of your appointment. . Patient verbalized understanding.[name]

## 2021-02-08 ENCOUNTER — Other Ambulatory Visit: Payer: Self-pay

## 2021-02-08 ENCOUNTER — Other Ambulatory Visit: Payer: Self-pay | Admitting: Family Medicine

## 2021-02-08 ENCOUNTER — Ambulatory Visit (HOSPITAL_COMMUNITY): Payer: Medicare Other | Attending: Cardiovascular Disease

## 2021-02-08 ENCOUNTER — Other Ambulatory Visit: Payer: Medicare Other | Admitting: *Deleted

## 2021-02-08 DIAGNOSIS — I2510 Atherosclerotic heart disease of native coronary artery without angina pectoris: Secondary | ICD-10-CM | POA: Insufficient documentation

## 2021-02-08 LAB — MYOCARDIAL PERFUSION IMAGING
LV dias vol: 46 mL (ref 46–106)
LV sys vol: 17 mL
Peak HR: 100 {beats}/min
Rest HR: 98 {beats}/min
SDS: 3
SRS: 0
SSS: 3
TID: 1.11

## 2021-02-08 LAB — LIPID PANEL
Chol/HDL Ratio: 3.4 ratio (ref 0.0–4.4)
Cholesterol, Total: 217 mg/dL — ABNORMAL HIGH (ref 100–199)
HDL: 63 mg/dL (ref >39)
LDL Chol Calc (NIH): 123 mg/dL — ABNORMAL HIGH (ref 0–99)
Triglycerides: 179 mg/dL — ABNORMAL HIGH (ref 0–149)
VLDL Cholesterol Cal: 31 mg/dL (ref 5–40)

## 2021-02-08 LAB — ALT: ALT: 17 IU/L (ref 0–32)

## 2021-02-08 MED ORDER — REGADENOSON 0.4 MG/5ML IV SOLN
0.4000 mg | Freq: Once | INTRAVENOUS | Status: AC
  Administered 2021-02-08: 0.4 mg via INTRAVENOUS

## 2021-02-08 MED ORDER — TECHNETIUM TC 99M TETROFOSMIN IV KIT
10.9000 | PACK | Freq: Once | INTRAVENOUS | Status: AC | PRN
  Administered 2021-02-08: 10.9 via INTRAVENOUS
  Filled 2021-02-08: qty 11

## 2021-02-08 MED ORDER — ALPRAZOLAM 0.5 MG PO TABS: 0.5000 mg | ORAL_TABLET | Freq: Every evening | ORAL | 3 refills | Status: DC | PRN

## 2021-02-08 MED ORDER — TECHNETIUM TC 99M TETROFOSMIN IV KIT
32.4000 | PACK | Freq: Once | INTRAVENOUS | Status: AC | PRN
  Administered 2021-02-08: 32.4 via INTRAVENOUS
  Filled 2021-02-08: qty 33

## 2021-02-08 NOTE — Telephone Encounter (Signed)
Ok to refill??  Last office visit 12/13/2020.  Last refill 01/07/2021.  Ok to add refills to prescription?

## 2021-02-08 NOTE — Telephone Encounter (Signed)
Patient called to request a refill of ALPRAZolam (XANAX) 0.5 MG tablet [[id number]]  Pharmacy confirmed as   [id number] [place], [place]  [address] [place] [address]  Phone:  [phone] Fax:  [phone]  DEA #:  [id number]  Please advise at [phone].

## 2021-02-11 ENCOUNTER — Ambulatory Visit: Payer: Medicare Other

## 2021-02-16 NOTE — Progress Notes (Signed)
Subjective:   [name] is a 80 y.o. female who presents for Medicare Annual (Subsequent) preventive examination.  Review of Systems    N/A Cardiac Risk Factors include: advanced age (>50men, >30 women);hypertension;dyslipidemia     Objective:    Today's Vitals   02/17/21 0955  BP: (!) 120/58  Pulse: 93  Temp: 98.4 F (36.9 C)  TempSrc: Oral  SpO2: 95%  Weight: 186 lb 4 oz (84.5 kg)  Height: 5\' 7"  (1.702 m)   Body mass index is 29.17 kg/m.  Advanced Directives 02/17/2021 06/15/2017 06/14/2017 07/28/2016 07/28/2015 07/28/2015 07/01/2015  Does Patient Have a Medical Advance Directive? Yes Yes Yes Yes Yes No Yes  Type of Paramedic of [place];Living will [place];Living will [place];Living will [place];Living will [place];Living will - [place];Living will  Does patient want to make changes to medical advance directive? No - Patient declined - - No - Patient declined No - Patient declined - No - Patient declined  Copy of [place] in Chart? Yes - validated most recent copy scanned in chart (See row information) Yes Yes No - copy requested No - copy requested - No - copy requested    Current Medications (verified) Outpatient Encounter Medications as of 02/17/2021  Medication Sig   acetaminophen (TYLENOL) 325 MG tablet Take by mouth.   albuterol (VENTOLIN HFA) 108 (90 Base) MCG/ACT inhaler Inhale 1 puff into the lungs every 6 (six) hours as needed for wheezing or shortness of breath.   ALPRAZolam (XANAX) 0.5 MG tablet Take 1 tablet (0.5 mg total) by mouth at bedtime as needed for sleep.   buPROPion (WELLBUTRIN XL) 150 MG 24 hr tablet TAKE 1 TABLET DAILY   Calcium Carbonate-Vit D-Min (CALCIUM 1200 PO) Take 50 mg by mouth. ONCE A WEEK   CLINPRO 5000 1.1 % PSTE Place onto teeth.   co-enzyme Q-10 30 MG capsule Take 100 mg by mouth  3 (three) times daily.   Cyanocobalamin 1000 MCG CAPS Take by mouth.   doxazosin (CARDURA) 4 MG tablet TAKE 1 TABLET DAILY   DULoxetine (CYMBALTA) 60 MG capsule Take 1 capsule (60 mg total) by mouth daily.   fluticasone (FLONASE) 50 MCG/ACT nasal spray USE 2 SPRAYS IN EACH NOSTRIL DAILY   LEVOXYL 112 MCG tablet TAKE 1 TABLET DAILY (DISCONTINUE LEVOTHYROXINE 100)   losartan-hydrochlorothiazide (HYZAAR) 100-25 MG tablet TAKE 1 TABLET DAILY   omeprazole (PRILOSEC) 20 MG capsule Take 1 capsule (20 mg total) by mouth 2 (two) times daily before a meal.   OXYGEN 2lpm with sleep   pravastatin (PRAVACHOL) 40 MG tablet TAKE 1 TABLET DAILY   thiamine (VITAMIN B-1) 100 MG tablet Take 100 mg by mouth daily.   TRELEGY ELLIPTA 100-62.5-25 MCG/INH AEPB USE 1 INHALATION DAILY (DISCONTINUE BEVESPI)   triamcinolone (KENALOG) 0.1 % Apply 1 application topically 2 (two) times daily.   Multiple Vitamins-Minerals (EMERGEN-C FIVE PO) Take by mouth. (Patient not taking: Reported on 02/17/2021)   traZODone (DESYREL) 50 MG tablet  (Patient not taking: Reported on 02/17/2021)   No facility-administered encounter medications on file as of 02/17/2021.    Allergies (verified) Amoxicillin, Doxycycline, Erythromycin, and Sulfonamide derivatives   History: Past Medical History:  Diagnosis Date   ADD (attention deficit disorder)    Allergic rhinitis    Anemia    Anxiety    Arrhythmia    Arthritis    "hands, neck, right shoulder" (  07/28/2015)   Aseptic necrosis ([place])    Asthma dxc'd 05/2015   Basal cell cancer    Bleeding stomach ulcer ~ 06/2005   COPD (chronic obstructive pulmonary disease) (HCC)    Depression    Diverticula of colon    Dyspnea    Esophagitis    Gastric ulcer with hemorrhage    GERD (gastroesophageal reflux disease)    Heart murmur    History of blood transfusion 06/2005   "related to pneumonia"   History of kidney stones    Hyperlipidemia    Hypertension    Hypothyroidism    Kidney stones     Lung cancer ([place])    Non small cell lung cancer, adenocarcinoma with BAC 09/2009, VATS converted to wedge resection of left upper lobe mass   Mitral valve prolapse    "no ORs" (07/28/2015)   Osteopenia    Pneumonia 1970's; 06/2005   Sleep apnea dx'd 06/2015   on oxygen at nite at 2L/[place]    Squamous cell skin cancer    Past Surgical History:  Procedure Laterality Date   ABDOMINAL HYSTERECTOMY  1988   COLONOSCOPY WITH PROPOFOL N/A 06/15/2017   Procedure: COLONOSCOPY WITH PROPOFOL;  Surgeon: Carol Ada, MD;  Location: [facility];  Service: Endoscopy;  Laterality: N/A;   HAMMER TOE SURGERY Right ~ 2010   SALPINGOOPHORECTOMY Left 1988   SKIN CANCER EXCISION     "I've had severl cut off RLE; left foreqrm, nose under nose"   THORACOTOMY Left 06/2005   THORACOTOMY/LOBECTOMY  09/2009; 11/2009   left; right   Family History  Problem Relation Age of Onset   Graves' disease Daughter    Breast cancer Maternal Aunt    Heart disease Maternal Aunt    Hypertension Mother    Arthritis Maternal Grandmother    Social History   Socioeconomic History   Marital status: Married    Spouse name: Not on file   Number of children: 3   Years of education: Not on file   Highest education level: Not on file  Occupational History   Occupation: retired Optician, dispensing: RETIRED  Tobacco Use   Smoking status: Former    Packs/day: 0.75    Years: 30.00    Pack years: 22.50    Types: Cigarettes    Quit date: 09/11/2004    Years since quitting: 16.4   Smokeless tobacco: Never  Vaping Use   Vaping Use: Never used  Substance and Sexual Activity   Alcohol use: Yes    Alcohol/week: 7.0 standard drinks    Types: 7 Shots of liquor per week    Comment: 1-2 drinks nightly    Drug use: No   Sexual activity: Yes  Other Topics Concern   Not on file  Social History Narrative   Not on file   Social Determinants of Health   Financial Resource Strain: Low Risk    Difficulty of Paying Living  Expenses: Not hard at all  Food Insecurity: No Food Insecurity   Worried About Charity fundraiser in the Last Year: Never true   [place] in the Last Year: Never true  Transportation Needs: No Transportation Needs   Lack of Transportation (Medical): No   Lack of Transportation (Non-Medical): No  Physical Activity: Inactive   Days of Exercise per Week: 0 days   Minutes of Exercise per Session: 0 min  Stress: No Stress Concern Present   Feeling of Stress : Not at all  Social Connections: Moderately Isolated   Frequency of Communication with Friends and Family: Twice a week   Frequency of Social Gatherings with Friends and Family: More than three times a week   Attends Religious Services: Never   Marine scientist or Organizations: No   Attends Music therapist: Never   Marital Status: Married    Tobacco Counseling Counseling given: Not Answered   Clinical Intake:  Pre-visit preparation completed: Yes  Pain : No/denies pain     Nutritional Risks: None Diabetes: No  How often do you need to have someone help you when you read instructions, pamphlets, or other written materials from your doctor or pharmacy?: 1 - Never  Diabetic?No  Interpreter Needed?: No  Information entered by :: [place] of Daily Living In your present state of health, do you have any difficulty performing the following activities: 02/17/2021  Hearing? Y  Comment has hearing aids  Vision? N  Difficulty concentrating or making decisions? Y  Walking or climbing stairs? Y  Dressing or bathing? N  Doing errands, shopping? N  Preparing Food and eating ? N  Using the Toilet? N  In the past six months, have you accidently leaked urine? Y  Comment has some bladder leakeage with stress incontience  Do you have problems with loss of bowel control? N  Managing your Medications? N  Managing your Finances? N  Housekeeping or managing your Housekeeping? N  Some  recent data might be hidden    Patient Care Team: Susy Frizzle, MD as PCP - General (Family Medicine) Sueanne Margarita, MD as PCP - Cardiology (Cardiology) Dr. Marcella Dubs (Oncology)  Indicate any recent Medical Services you may have received from other than [facility] providers in the past year (date may be approximate).     Assessment:   This is a routine wellness examination for [place].  Hearing/Vision screen Vision Screening - Comments:: Patient states gets eye exams once per year. Wears glasses for driving   Dietary issues and exercise activities discussed: Current Exercise Habits: The patient does not participate in regular exercise at present, Exercise limited by: respiratory conditions(s)   Goals Addressed             This Visit's Progress    DIET - EAT MORE FRUITS AND VEGETABLES       Manage My Cholesterol       Timeframe:  Long-Range Goal Priority:  High Start Date:                             Expected End Date:                       Follow Up Date 08/19/2021    - eat smaller or less servings of red meat - fill half the plate with nonstarchy vegetables    Why is this important?   Changing cholesterol starts with eating heart-healthy foods.  Other steps may be to increase your activity and to quit if you smoke.    Notes:         Depression Screen PHQ 2/9 Scores 02/17/2021 12/13/2020 06/29/2020 11/12/2018 05/21/2018 10/02/2017 11/21/2016  PHQ - 2 Score 4 6 6 6 4 4  0  PHQ- 9 Score 16 22 22 16 15 13  -    Fall Risk Fall Risk  02/17/2021 12/13/2020 10/08/2020 06/29/2020 11/15/2018  Falls in the past year? 0 0 0 0  1  Comment - - - - -  Number falls in past yr: 0 - 0 0 1  Comment - - - - -  Injury with Fall? 0 - 0 0 1  Risk for fall due to : Medication side effect No Fall Risks No Fall Risks - -  Follow up Falls evaluation completed;Falls prevention discussed - Falls evaluation completed Falls evaluation completed Falls evaluation completed    FALL RISK  PREVENTION PERTAINING TO THE HOME:  Any stairs in or around the home? Yes  If so, are there any without handrails? No  Home free of loose throw rugs in walkways, pet beds, electrical cords, etc? Yes  Adequate lighting in your home to reduce risk of falls? Yes   ASSISTIVE DEVICES UTILIZED TO PREVENT FALLS:  Life alert? No  Use of a cane, walker or w/c? No  Grab bars in the bathroom? Yes  Shower chair or bench in shower? Yes  Elevated toilet seat or a handicapped toilet? Yes   TIMED UP AND GO:  Was the test performed? Yes .  Length of time to ambulate 10 feet: 3 sec.   Gait steady and fast without use of assistive device  Cognitive Function:     6CIT Screen 02/17/2021  What Year? 0 points  What month? 0 points  What time? 0 points  Count back from 20 4 points  Months in reverse 0 points  Repeat phrase 0 points  Total Score 4    Immunizations Immunization History  Administered Date(s) Administered   Fluad Quad(high Dose 65+) 05/13/2019, 06/29/2020   Influenza, High Dose Seasonal PF 06/22/2015   Influenza,inj,Quad PF,6+ Mos 05/29/2014, 05/30/2016, 05/28/2017, 05/21/2018   Influenza-Unspecified 05/29/2014, 06/23/2015, 05/30/2016, 05/28/2017, 05/21/2018   Moderna Sars-Covid-2 Vaccination 09/24/2019, 10/22/2019, 06/03/2020   Pneumococcal Conjugate-13 01/16/2014   Pneumococcal Polysaccharide-23 07/03/2012, 05/10/2020   Td 09/12/1993   Tdap 12/21/2012   Zoster Recombinat (Shingrix) 01/04/2020   Zoster, Live 09/27/2007    TDAP status: Up to date  Flu Vaccine status: Up to date  Pneumococcal vaccine status: Up to date  Covid-19 vaccine status: Completed vaccines  Qualifies for Shingles Vaccine? Yes   Zostavax completed Yes   Shingrix Completed?: Yes  Screening Tests Health Maintenance  Topic Date Due   Pneumococcal Vaccine 54-64 Years old (1 - PCV) Never done   Hepatitis C Screening  Never done   Zoster Vaccines- Shingrix (2 of 2) 02/29/2020   COVID-19 Vaccine  (4 - Booster for Moderna series) 09/02/2020   INFLUENZA VACCINE  04/11/2021   TETANUS/TDAP  12/22/2022   DEXA SCAN  Completed   PNA vac Low Risk Adult  Completed   HPV VACCINES  Aged Out    Health Maintenance  Health Maintenance Due  Topic Date Due   Pneumococcal Vaccine 65-48 Years old (1 - PCV) Never done   Hepatitis C Screening  Never done   Zoster Vaccines- Shingrix (2 of 2) 02/29/2020   COVID-19 Vaccine (4 - Booster for Moderna series) 09/02/2020    Colorectal cancer screening: No longer required.   Mammogram status: Completed 03/01/2020. Repeat every year  Bone Density status: Ordered 02/17/2021. Pt provided with contact info and advised to call to schedule appt.  Lung Cancer Screening: (Low Dose CT Chest recommended if Age 73-80 years, 30 pack-year currently smoking OR have quit w/in 15years.) does not qualify.   Lung Cancer Screening Referral: N/A  Additional Screening:  Hepatitis C Screening: does qualify;  Vision Screening: Recommended annual ophthalmology exams for early  detection of glaucoma and other disorders of the eye. Is the patient up to date with their annual eye exam?  Yes  Who is the provider or what is the name of the office in which the patient attends annual eye exams? Dr. Kathlen Mody If pt is not established with a provider, would they like to be referred to a provider to establish care? No .   Dental Screening: Recommended annual dental exams for proper oral hygiene  Community Resource Referral / Chronic Care Management: CRR required this visit?  No   CCM required this visit?  No      Plan:     I have personally reviewed and noted the following in the patient's chart:   Medical and social history Use of alcohol, tobacco or illicit drugs  Current medications and supplements including opioid prescriptions.  Functional ability and status Nutritional status Physical activity Advanced directives List of other physicians Hospitalizations,  surgeries, and ER visits in previous 12 months Vitals Screenings to include cognitive, depression, and falls Referrals and appointments  In addition, I have reviewed and discussed with patient certain preventive protocols, quality metrics, and best practice recommendations. A written personalized care plan for preventive services as well as general preventive health recommendations were provided to patient.     Ofilia Neas, LPN   0[birth year]   Nurse Notes: None

## 2021-02-17 ENCOUNTER — Ambulatory Visit (INDEPENDENT_AMBULATORY_CARE_PROVIDER_SITE_OTHER): Payer: Medicare Other

## 2021-02-17 ENCOUNTER — Other Ambulatory Visit: Payer: Self-pay

## 2021-02-17 VITALS — BP 120/58 | HR 93 | Temp 98.4°F | Ht 67.0 in | Wt 186.2 lb

## 2021-02-17 DIAGNOSIS — Z0000 Encounter for general adult medical examination without abnormal findings: Secondary | ICD-10-CM

## 2021-02-17 DIAGNOSIS — Z0001 Encounter for general adult medical examination with abnormal findings: Secondary | ICD-10-CM

## 2021-02-17 DIAGNOSIS — Z1231 Encounter for screening mammogram for malignant neoplasm of breast: Secondary | ICD-10-CM | POA: Diagnosis not present

## 2021-02-17 DIAGNOSIS — Z780 Asymptomatic menopausal state: Secondary | ICD-10-CM | POA: Diagnosis not present

## 2021-02-17 NOTE — Patient Instructions (Signed)
[name] , Thank you for taking time to come for your Medicare Wellness Visit. I appreciate your ongoing commitment to your health goals. Please review the following plan we discussed and let me know if I can assist you in the future.   Screening recommendations/referrals: Colonoscopy: No longer required Mammogram: Up to date, next due 03/01/2021 Bone Density: Currently due, orders placed this visit Recommended yearly ophthalmology/optometry visit for glaucoma screening and checkup Recommended yearly dental visit for hygiene and checkup  Vaccinations: Influenza vaccine: Up to date, next due fall 2022 Pneumococcal vaccine: Completed series  Tdap vaccine: Up to date, next due 12/22/2022 Shingles vaccine: Completed series    Advanced directives: Copies on file   Conditions/risks identified: None   Next appointment: None   Preventive Care 36 Years and Older, Female Preventive care refers to lifestyle choices and visits with your health care provider that can promote health and wellness. What does preventive care include? A yearly physical exam. This is also called an annual well check. Dental exams once or twice a year. Routine eye exams. Ask your health care provider how often you should have your eyes checked. Personal lifestyle choices, including: Daily care of your teeth and gums. Regular physical activity. Eating a healthy diet. Avoiding tobacco and drug use. Limiting alcohol use. Practicing safe sex. Taking low-dose aspirin every day. Taking vitamin and mineral supplements as recommended by your health care provider. What happens during an annual well check? The services and screenings done by your health care provider during your annual well check will depend on your age, overall health, lifestyle risk factors, and family history of disease. Counseling  Your health care provider may ask you questions about your: Alcohol use. Tobacco use. Drug use. Emotional  well-being. Home and relationship well-being. Sexual activity. Eating habits. History of falls. Memory and ability to understand (cognition). Work and work Statistician. Reproductive health. Screening  You may have the following tests or measurements: Height, weight, and BMI. Blood pressure. Lipid and cholesterol levels. These may be checked every 5 years, or more frequently if you are over 54 years old. Skin check. Lung cancer screening. You may have this screening every year starting at age 24 if you have a 30-pack-year history of smoking and currently smoke or have quit within the past 15 years. Fecal occult blood test (FOBT) of the stool. You may have this test every year starting at age 72. Flexible sigmoidoscopy or colonoscopy. You may have a sigmoidoscopy every 5 years or a colonoscopy every 10 years starting at age 5. Hepatitis C blood test. Hepatitis B blood test. Sexually transmitted disease (STD) testing. Diabetes screening. This is done by checking your blood sugar (glucose) after you have not eaten for a while (fasting). You may have this done every 1-3 years. Bone density scan. This is done to screen for osteoporosis. You may have this done starting at age 34. Mammogram. This may be done every 1-2 years. Talk to your health care provider about how often you should have regular mammograms. Talk with your health care provider about your test results, treatment options, and if necessary, the need for more tests. Vaccines  Your health care provider may recommend certain vaccines, such as: Influenza vaccine. This is recommended every year. Tetanus, diphtheria, and acellular pertussis (Tdap, Td) vaccine. You may need a Td booster every 10 years. Zoster vaccine. You may need this after age 45. Pneumococcal 13-valent conjugate (PCV13) vaccine. One dose is recommended after age 55. Pneumococcal polysaccharide (PPSV23) vaccine.  One dose is recommended after age 50. Talk to your  health care provider about which screenings and vaccines you need and how often you need them. This information is not intended to replace advice given to you by your health care provider. Make sure you discuss any questions you have with your health care provider. Document Released: 09/24/2015 Document Revised: 05/17/2016 Document Reviewed: 06/29/2015 Elsevier Interactive Patient Education  2017 [place] Prevention in the Home Falls can cause injuries. They can happen to people of all ages. There are many things you can do to make your home safe and to help prevent falls. What can I do on the outside of my home? Regularly fix the edges of walkways and driveways and fix any cracks. Remove anything that might make you trip as you walk through a door, such as a raised step or threshold. Trim any bushes or trees on the path to your home. Use bright outdoor lighting. Clear any walking paths of anything that might make someone trip, such as rocks or tools. Regularly check to see if handrails are loose or broken. Make sure that both sides of any steps have handrails. Any raised decks and porches should have guardrails on the edges. Have any leaves, snow, or ice cleared regularly. Use sand or salt on walking paths during winter. Clean up any spills in your garage right away. This includes oil or grease spills. What can I do in the bathroom? Use night lights. Install grab bars by the toilet and in the tub and shower. Do not use towel bars as grab bars. Use non-skid mats or decals in the tub or shower. If you need to sit down in the shower, use a plastic, non-slip stool. Keep the floor dry. Clean up any water that spills on the floor as soon as it happens. Remove soap buildup in the tub or shower regularly. Attach bath mats securely with double-sided non-slip rug tape. Do not have throw rugs and other things on the floor that can make you trip. What can I do in the bedroom? Use night  lights. Make sure that you have a light by your bed that is easy to reach. Do not use any sheets or blankets that are too big for your bed. They should not hang down onto the floor. Have a firm chair that has side arms. You can use this for support while you get dressed. Do not have throw rugs and other things on the floor that can make you trip. What can I do in the kitchen? Clean up any spills right away. Avoid walking on wet floors. Keep items that you use a lot in easy-to-reach places. If you need to reach something above you, use a strong step stool that has a grab bar. Keep electrical cords out of the way. Do not use floor polish or wax that makes floors slippery. If you must use wax, use non-skid floor wax. Do not have throw rugs and other things on the floor that can make you trip. What can I do with my stairs? Do not leave any items on the stairs. Make sure that there are handrails on both sides of the stairs and use them. Fix handrails that are broken or loose. Make sure that handrails are as long as the stairways. Check any carpeting to make sure that it is firmly attached to the stairs. Fix any carpet that is loose or worn. Avoid having throw rugs at the top or bottom of  the stairs. If you do have throw rugs, attach them to the floor with carpet tape. Make sure that you have a light switch at the top of the stairs and the bottom of the stairs. If you do not have them, ask someone to add them for you. What else can I do to help prevent falls? Wear shoes that: Do not have high heels. Have rubber bottoms. Are comfortable and fit you well. Are closed at the toe. Do not wear sandals. If you use a stepladder: Make sure that it is fully opened. Do not climb a closed stepladder. Make sure that both sides of the stepladder are locked into place. Ask someone to hold it for you, if possible. Clearly mark and make sure that you can see: Any grab bars or handrails. First and last  steps. Where the edge of each step is. Use tools that help you move around (mobility aids) if they are needed. These include: Canes. Walkers. Scooters. Crutches. Turn on the lights when you go into a dark area. Replace any light bulbs as soon as they burn out. Set up your furniture so you have a clear path. Avoid moving your furniture around. If any of your floors are uneven, fix them. If there are any pets around you, be aware of where they are. Review your medicines with your doctor. Some medicines can make you feel dizzy. This can increase your chance of falling. Ask your doctor what other things that you can do to help prevent falls. This information is not intended to replace advice given to you by your health care provider. Make sure you discuss any questions you have with your health care provider. Document Released: 06/24/2009 Document Revised: 02/03/2016 Document Reviewed: 10/02/2014 Elsevier Interactive Patient Education  2017 [employer].

## 2021-02-21 ENCOUNTER — Other Ambulatory Visit: Payer: Self-pay | Admitting: *Deleted

## 2021-02-21 MED ORDER — ROSUVASTATIN CALCIUM 20 MG PO TABS: 20.0000 mg | ORAL_TABLET | Freq: Every day | ORAL | 3 refills | Status: DC

## 2021-02-21 MED ORDER — TRAZODONE HCL 50 MG PO TABS: 50.0000 mg | ORAL_TABLET | Freq: Every evening | ORAL | 3 refills | Status: DC | PRN

## 2021-02-23 DIAGNOSIS — L821 Other seborrheic keratosis: Secondary | ICD-10-CM | POA: Diagnosis not present

## 2021-02-23 DIAGNOSIS — L814 Other melanin hyperpigmentation: Secondary | ICD-10-CM | POA: Diagnosis not present

## 2021-02-23 DIAGNOSIS — D229 Melanocytic nevi, unspecified: Secondary | ICD-10-CM | POA: Diagnosis not present

## 2021-02-23 DIAGNOSIS — L570 Actinic keratosis: Secondary | ICD-10-CM | POA: Diagnosis not present

## 2021-02-23 DIAGNOSIS — D485 Neoplasm of uncertain behavior of skin: Secondary | ICD-10-CM | POA: Diagnosis not present

## 2021-02-23 DIAGNOSIS — C44729 Squamous cell carcinoma of skin of left lower limb, including hip: Secondary | ICD-10-CM | POA: Diagnosis not present

## 2021-03-09 ENCOUNTER — Telehealth: Payer: Self-pay | Admitting: *Deleted

## 2021-03-09 NOTE — Telephone Encounter (Signed)
Patient due for Cologuard re-screen.   Order placed via [facility].   Cologuard (Order [id number])

## 2021-03-23 ENCOUNTER — Other Ambulatory Visit: Payer: Self-pay | Admitting: Family Medicine

## 2021-03-28 ENCOUNTER — Other Ambulatory Visit: Payer: Self-pay | Admitting: Family Medicine

## 2021-04-05 ENCOUNTER — Ambulatory Visit (INDEPENDENT_AMBULATORY_CARE_PROVIDER_SITE_OTHER): Payer: Medicare Other | Admitting: Family Medicine

## 2021-04-05 ENCOUNTER — Other Ambulatory Visit: Payer: Self-pay

## 2021-04-05 ENCOUNTER — Encounter: Payer: Self-pay | Admitting: Family Medicine

## 2021-04-05 VITALS — BP 128/68 | HR 86 | Temp 98.1°F | Resp 14 | Ht 67.0 in | Wt 190.0 lb

## 2021-04-05 DIAGNOSIS — F331 Major depressive disorder, recurrent, moderate: Secondary | ICD-10-CM

## 2021-04-05 DIAGNOSIS — I2510 Atherosclerotic heart disease of native coronary artery without angina pectoris: Secondary | ICD-10-CM | POA: Diagnosis not present

## 2021-04-05 DIAGNOSIS — E038 Other specified hypothyroidism: Secondary | ICD-10-CM | POA: Diagnosis not present

## 2021-04-05 MED ORDER — BREXPIPRAZOLE 1 MG PO TABS: 1.0000 mg | ORAL_TABLET | Freq: Every day | ORAL | 3 refills | Status: DC

## 2021-04-05 MED ORDER — BUPROPION HCL ER (XL) 150 MG PO TB24: 150.0000 mg | ORAL_TABLET | Freq: Every day | ORAL | 3 refills | Status: DC

## 2021-04-05 NOTE — Progress Notes (Signed)
Subjective:    Patient ID: [name], female    DOB: 03/02/1941, 80 y.o.   MRN: [id number]  Depression       06/29/20 Patient reports that she feels depressed.  She has been on Cymbalta and bupropion for quite some time.  However for the last several months, she has lost hope.  She states that she has no desire.  She simply sits around her home and has no motivation to leave.  She has no motivation exercise.  All she does is sit in her chair and watch TV.  She is under tremendous stress caring for her husband who has MS as well as dementia.  As result of the stress and the depression she reports memory loss and trouble focusing.  She is having a hard time thinking of words that she wants to say.  She states that she is having trouble sleeping.  Therefore she is self-medicating.  She is taking 2 trazodone 50 mg tablets at night and 100 mg of Benadryl.  I immediately told the patient to stop doing this due to anticholinergic side effects.  Instead I want her to use Xanax 0.5 mg p.o. nightly until we have the depression under control.  She denies any manic thoughts.  She denies any delusions or hallucinations or suicidal ideation or homicidal ideation.  She does report anhedonia and depression and feeling sad.  At that time, my plan was: I believe the stress of caring for her husband with dementia, the loss of her son earlier this year, and her general declining health have all contributed to worsen her depression.  Discontinue Cymbalta as the patient has been on this for many years and start Trintellix 10 mg daily.  Recheck here in 3 weeks or sooner if worsening.  Stop using trazodone and Benadryl at night.  Explained the risk of anticholinergic side effects.  Instead temporarily use Xanax at night to help her self sleep.  See the patient back in 3 weeks.  If this is not working, could consider supplementing with [place] or Abilify.  07/15/20 Patient is not sure that she seen any benefit since  switching from Cymbalta to Trintellix.  However she continues to have trouble sleeping at night.  She states that she wakes up about every day at 3 AM unable to sleep more.  She also reports itching all over her body.  She is scratching and clawing at the skin of the dorsums of her forearms as well as on her anterior shins bilaterally.  There is no visible rash.  There is no erythema.  There is no excoriations.  There is no explanation for the itching on visual inspection of the skin.  There is no evidence of jaundice.  She does have a history of polycythemia and she states that the itching feels similar to when her blood counts would rise in the past.  At that time, my plan was: Physical exam is normal.  Check CBC to rule out polycythemia.  Check CMP to check for kidney or liver abnormalities that can cause itching.  If normal, I question if this could be from reducing her trazodone and Benadryl, stopping her Cymbalta, starting Trintellix.  The other possibility would be "neurodermatitis/neuropathic itching".  Try hydroxyzine 25 mg every 8 hours as needed for itching or for insomnia to see if this helps and await the results of the lab test.  If the depression is not better in 2 weeks and the itching is not better,  I would recommend stopping Trintellix and resuming Cymbalta but then possibly adding Rexulti to Cymbalta.  08/10/20 Patient recently saw my partner and was clearly manic.  She presents today with her daughter.  She states that since I last saw her, she ordered a $13,000 remodel of her bathroom for no particular reason.  Her daughter also states that she was trying to order solar panels for her home.  This is out of character for the patient.  She was spending money unnecessarily and without good reason.  She also reports trouble sleeping.  The patient was taking trazodone, Benadryl, and Xanax independently without my direction trying to help find a way to go to sleep.  She reports racing thoughts in  her mind wandering at night.  She was also on prednisone at the same time for a sinus infection in addition to taking Adderall for ADD.  She has since abruptly stopped Trintellix.  Although she recently started Trintellix she is also been on Effexor for many years prior so I am concerned about serotonin withdrawal.  She is here today for follow-up.  At that time, my plan was: Although she appears much calmer today, I believe that the patient's symptoms now classify mania.  Therefore I believe that she has bipolar if she is clearly had depression in the past.  Therefore we made several changes today.  First we will stop Adderall.  I do not believe that a stimulant is beneficial to a person having insomnia with manic symptoms.  Her daughter will remove this medication from her box.  Second I do not want her to abruptly discontinue Trintellix.  I am concerned about serotonin withdrawal.  Therefore I have asked her to decrease Trintellix gradually 1st-5 milligrams every other day for a week and then 5 mg every third day for a week and then stop.  She will wean herself off of this medication.  Third I recommended that she stop abusing alcohol.  Apparently she has been drinking an excessive amount of wine in an attempt to try to go to sleep.  I believe that this is only adding to the problem.  I also do not want her taking medications independently at home.  Therefore I recommended that she stop all Benadryl, trazodone, hydroxyzine etc.  Instead I feel that we need to focus on treating bipolar mania.  Therefore I will start her on Abilify 5 mg daily and reassess the patient in 1 week.  She is still on bupropion.  We may want to reassess stopping that medication in the future but I hesitate to make too many changes at one time.  I will also consult psychiatry as soon as possible  08/16/20 Patient seems to be much calmer today.  Her thought process is easy to follow and much more logical.  There is no tangential speech  or pressured speech.  She reports improved depression and improved anxiety however she continues to have difficulty sleeping.  She states that she will lay down at night and read in bed however she will simply toss and turn unable to sleep.  She is taking 1-2 Xanax in the evening with no benefit.  She is now off her stimulant.  She is weaning off Trintellix.  At that time, my plan was: Patient's presentation seems much more stable today.  From the standpoint of train of thought, judgment, and impulsivity, the patient seems to be near her baseline.  Her anxiety and depression also seem to be better however she  still not sleeping.  I will keep Abilify at his current dose 5 mg a day.  I will discontinue Xanax and switch to Klonopin 0.5 mg, 1 to 2 tablets at night as needed insomnia.   12/13/20 Patient saw psychiatry who did not feel that she had bipolar.  They weaned her off the Abilify and started her on mirtazapine.  She did not like the way mirtazapine made her feel and she did not follow-up with psychiatry.  She did not make a follow-up appointment.  She now reports depression.  She states that she has anhedonia.  She is overwhelmed with stress caring for her husband who has advanced dementia.  She has anxiety stemming from this as he is argumentative and she frequently has to deal with his complaints and somewhat belligerent attitude.  She reports anhedonia.  She reports decreasing energy.  She reports feeling sad.  She denies any racing thoughts.  She denies any delusions.  She denies any suicidal ideation.  In the past she took Cymbalta for several years without any issue.  At that time, my plan was: Resume Cymbalta 60 mg a day and then reassess in 4 to 6 weeks  04/05/21 Patient is seeing no benefit from resuming Cymbalta or continuing bupropion.  She continues to feel depressed with anhedonia.  She states that she is losing the desire to do anything.  She states that she simply wakes up and sits around all  day watching TV with no desire to leave the home or to do activities around the home or to even exercise.  She blames this on her weight gain.  She reports no sense of enjoyment in life.  She states that the trouble sleeping has improved.  She denies any panic attacks.  However she is certainly way down due to the caring of her husband who has dementia.  This is creating a tremendous physical strain on her.  She denies any delusions.  She denies any racing thoughts.  She denies any suicidal ideation.  However, she reports psychomotor retardation, decreased energy, and decreased concentration  Past Medical History:  Diagnosis Date   ADD (attention deficit disorder)    Allergic rhinitis    Anemia    Anxiety    Arrhythmia    Arthritis    "hands, neck, right shoulder" (07/28/2015)   Aseptic necrosis ([place])    Asthma dxc'd 05/2015   Basal cell cancer    Bleeding stomach ulcer ~ 06/2005   COPD (chronic obstructive pulmonary disease) (HCC)    Depression    Diverticula of colon    Dyspnea    Esophagitis    Gastric ulcer with hemorrhage    GERD (gastroesophageal reflux disease)    Heart murmur    History of blood transfusion 06/2005   "related to pneumonia"   History of kidney stones    Hyperlipidemia    Hypertension    Hypothyroidism    Kidney stones    Lung cancer ([place])    Non small cell lung cancer, adenocarcinoma with BAC 09/2009, VATS converted to wedge resection of left upper lobe mass   Mitral valve prolapse    "no ORs" (07/28/2015)   Osteopenia    Pneumonia 1970's; 06/2005   Sleep apnea dx'd 06/2015   on oxygen at nite at 2L/[place]    Squamous cell skin cancer    Past Surgical History:  Procedure Laterality Date   ABDOMINAL HYSTERECTOMY  1988   COLONOSCOPY WITH PROPOFOL N/A 06/15/2017   Procedure: COLONOSCOPY  WITH PROPOFOL;  Surgeon: Carol Ada, MD;  Location: [facility];  Service: Endoscopy;  Laterality: N/A;   HAMMER TOE SURGERY Right ~ 2010   SALPINGOOPHORECTOMY Left 1988    SKIN CANCER EXCISION     "I've had severl cut off RLE; left foreqrm, nose under nose"   THORACOTOMY Left 06/2005   THORACOTOMY/LOBECTOMY  09/2009; 11/2009   left; right   Current Outpatient Medications on File Prior to Visit  Medication Sig Dispense Refill   acetaminophen (TYLENOL) 325 MG tablet Take by mouth.     albuterol (VENTOLIN HFA) 108 (90 Base) MCG/ACT inhaler Inhale 1 puff into the lungs every 6 (six) hours as needed for wheezing or shortness of breath.     ALPRAZolam (XANAX) 0.5 MG tablet Take 1 tablet (0.5 mg total) by mouth at bedtime as needed for sleep. 30 tablet 3   Calcium Carbonate-Vit D-Min (CALCIUM 1200 PO) Take 50 mg by mouth. ONCE A WEEK     CLINPRO 5000 1.1 % PSTE Place onto teeth.     co-enzyme Q-10 30 MG capsule Take 100 mg by mouth 3 (three) times daily.     Cyanocobalamin 1000 MCG CAPS Take by mouth.     doxazosin (CARDURA) 4 MG tablet TAKE 1 TABLET DAILY 90 tablet 3   DULoxetine (CYMBALTA) 60 MG capsule Take 1 capsule (60 mg total) by mouth daily. 90 capsule 3   fluticasone (FLONASE) 50 MCG/ACT nasal spray USE 2 SPRAYS IN EACH NOSTRIL DAILY 48 g 3   LEVOXYL 112 MCG tablet TAKE 1 TABLET DAILY (DISCONTINUE LEVOTHYROXINE 100) 90 tablet 3   losartan-hydrochlorothiazide (HYZAAR) 100-25 MG tablet TAKE 1 TABLET DAILY 90 tablet 3   Multiple Vitamins-Minerals (EMERGEN-C FIVE PO) Take by mouth.     omeprazole (PRILOSEC) 20 MG capsule Take 1 capsule (20 mg total) by mouth 2 (two) times daily before a meal. 90 capsule 3   OXYGEN 2lpm with sleep     rosuvastatin (CRESTOR) 20 MG tablet Take 1 tablet (20 mg total) by mouth daily. 90 tablet 3   thiamine (VITAMIN B-1) 100 MG tablet Take 100 mg by mouth daily.     traZODone (DESYREL) 50 MG tablet Take 1 tablet (50 mg total) by mouth at bedtime as needed for sleep. 90 tablet 3   TRELEGY ELLIPTA 100-62.5-25 MCG/INH AEPB USE 1 INHALATION DAILY (DISCONTINUE BEVESPI) 180 each 3   triamcinolone (KENALOG) 0.1 % Apply 1 application  topically 2 (two) times daily. 453 g 0   No current facility-administered medications on file prior to visit.    Allergies  Allergen Reactions   Amoxicillin     Diarrhea, doesnt tolerate    Doxycycline Nausea Only   Erythromycin Nausea Only   Sulfonamide Derivatives Hives   Social History   Socioeconomic History   Marital status: Married    Spouse name: Not on file   Number of children: 3   Years of education: Not on file   Highest education level: Not on file  Occupational History   Occupation: retired Optician, dispensing: RETIRED  Tobacco Use   Smoking status: Former    Packs/day: 0.75    Years: 30.00    Pack years: 22.50    Types: Cigarettes    Quit date: 09/11/2004    Years since quitting: 16.5   Smokeless tobacco: Never  Vaping Use   Vaping Use: Never used  Substance and Sexual Activity   Alcohol use: Yes    Alcohol/week: 7.0 standard drinks  Types: 7 Shots of liquor per week    Comment: 1-2 drinks nightly    Drug use: No   Sexual activity: Yes  Other Topics Concern   Not on file  Social History Narrative   Not on file   Social Determinants of Health   Financial Resource Strain: Low Risk    Difficulty of Paying Living Expenses: Not hard at all  Food Insecurity: No Food Insecurity   Worried About Charity fundraiser in the Last Year: Never true   [place] in the Last Year: Never true  Transportation Needs: No Transportation Needs   Lack of Transportation (Medical): No   Lack of Transportation (Non-Medical): No  Physical Activity: Inactive   Days of Exercise per Week: 0 days   Minutes of Exercise per Session: 0 min  Stress: No Stress Concern Present   Feeling of Stress : Not at all  Social Connections: Moderately Isolated   Frequency of Communication with Friends and Family: Twice a week   Frequency of Social Gatherings with Friends and Family: More than three times a week   Attends Religious Services: Never   Marine scientist or  Organizations: No   Attends Music therapist: Never   Marital Status: Married  Human resources officer Violence: Not At Risk   Fear of Current or Ex-Partner: No   Emotionally Abused: No   Physically Abused: No   Sexually Abused: No      Review of Systems  All other systems reviewed and are negative.     Objective:   Physical Exam Vitals reviewed.  Constitutional:      General: She is not in acute distress.    Appearance: Normal appearance. She is normal weight. She is not ill-appearing or toxic-appearing.  Cardiovascular:     Rate and Rhythm: Normal rate and regular rhythm.  Pulmonary:     Effort: Pulmonary effort is normal.     Breath sounds: Normal breath sounds.  Neurological:     Mental Status: She is alert.          Assessment & Plan:  Other specified hypothyroidism - Plan: CBC with Differential/Platelet, COMPLETE METABOLIC PANEL WITH GFR, TSH  Moderate episode of recurrent major depressive disorder ([place]) Patient is tried and failed numerous therapies in the past.  We discussed increasing Cymbalta to 90 mg a day and resuming a stimulant such as Adderall to help with focus and energy.  We also compared that versus adding Rexulti 1 mg a day and discontinuing bupropion.  Ultimately the patient would like to try adding Rexulti 1 mg a day and discontinuing bupropion.  Reassess in 3 to 4 weeks or sooner if worsening.  Check TSH and baseline labs to rule out hypothyroidism as a contributing factor

## 2021-04-06 ENCOUNTER — Encounter: Payer: Self-pay | Admitting: Family Medicine

## 2021-04-06 LAB — CBC WITH DIFFERENTIAL/PLATELET
Absolute Monocytes: 823 cells/uL (ref 200–950)
Basophils Absolute: 50 cells/uL (ref 0–200)
Basophils Relative: 0.6 %
Eosinophils Absolute: 227 cells/uL (ref 15–500)
Eosinophils Relative: 2.7 %
HCT: 41 % (ref 35.0–45.0)
Hemoglobin: 13.8 g/dL (ref 11.7–15.5)
Lymphs Abs: 2167 cells/uL (ref 850–3900)
MCH: 33.3 pg — ABNORMAL HIGH (ref 27.0–33.0)
MCHC: 33.7 g/dL (ref 32.0–36.0)
MCV: 99 fL (ref 80.0–100.0)
MPV: 10.9 fL (ref 7.5–12.5)
Monocytes Relative: 9.8 %
Neutro Abs: 5132 cells/uL (ref 1500–7800)
Neutrophils Relative %: 61.1 %
Platelets: 245 10*3/uL (ref 140–400)
RBC: 4.14 10*6/uL (ref 3.80–5.10)
RDW: 12.5 % (ref 11.0–15.0)
Total Lymphocyte: 25.8 %
WBC: 8.4 10*3/uL (ref 3.8–10.8)

## 2021-04-06 LAB — COMPLETE METABOLIC PANEL WITH GFR
AG Ratio: 1.8 (calc) (ref 1.0–2.5)
ALT: 17 U/L (ref 6–29)
AST: 18 U/L (ref 10–35)
Albumin: 4.2 g/dL (ref 3.6–5.1)
Alkaline phosphatase (APISO): 67 U/L (ref 37–153)
BUN: 18 mg/dL (ref 7–25)
CO2: 30 mmol/L (ref 20–32)
Calcium: 9.5 mg/dL (ref 8.6–10.4)
Chloride: 101 mmol/L (ref 98–110)
Creat: 0.76 mg/dL (ref 0.60–1.00)
Globulin: 2.3 g/dL (calc) (ref 1.9–3.7)
Glucose, Bld: 100 mg/dL — ABNORMAL HIGH (ref 65–99)
Potassium: 4.7 mmol/L (ref 3.5–5.3)
Sodium: 139 mmol/L (ref 135–146)
Total Bilirubin: 0.4 mg/dL (ref 0.2–1.2)
Total Protein: 6.5 g/dL (ref 6.1–8.1)
eGFR: 80 mL/min/{1.73_m2} (ref >=60)

## 2021-04-06 LAB — TSH: TSH: 2.06 mIU/L (ref 0.40–4.50)

## 2021-04-07 ENCOUNTER — Telehealth: Payer: Self-pay | Admitting: *Deleted

## 2021-04-07 MED ORDER — BREXPIPRAZOLE 1 MG PO TABS: 1.0000 mg | ORAL_TABLET | Freq: Every day | ORAL | 3 refills | Status: DC

## 2021-04-07 NOTE — Telephone Encounter (Signed)
Received request from pharmacy for [facility] on [address].  PA submitted.   Dx: F33.1- MDD, moderate.   Received immediate determination.   PA [id number] approved 03/08/2021-  09/10/2098.

## 2021-04-18 DIAGNOSIS — L905 Scar conditions and fibrosis of skin: Secondary | ICD-10-CM | POA: Diagnosis not present

## 2021-04-18 DIAGNOSIS — C44722 Squamous cell carcinoma of skin of right lower limb, including hip: Secondary | ICD-10-CM | POA: Diagnosis not present

## 2021-04-18 DIAGNOSIS — Z85828 Personal history of other malignant neoplasm of skin: Secondary | ICD-10-CM | POA: Diagnosis not present

## 2021-04-18 DIAGNOSIS — L570 Actinic keratosis: Secondary | ICD-10-CM | POA: Diagnosis not present

## 2021-04-18 DIAGNOSIS — D485 Neoplasm of uncertain behavior of skin: Secondary | ICD-10-CM | POA: Diagnosis not present

## 2021-04-26 ENCOUNTER — Ambulatory Visit: Payer: Medicare Other | Admitting: Family Medicine

## 2021-04-28 ENCOUNTER — Other Ambulatory Visit: Payer: Self-pay | Admitting: *Deleted

## 2021-04-28 MED ORDER — DULOXETINE HCL 60 MG PO CPEP: 60.0000 mg | ORAL_CAPSULE | Freq: Every day | ORAL | 3 refills | Status: DC

## 2021-05-02 ENCOUNTER — Encounter: Payer: Self-pay | Admitting: Family Medicine

## 2021-05-02 ENCOUNTER — Other Ambulatory Visit: Payer: Self-pay

## 2021-05-02 ENCOUNTER — Ambulatory Visit (INDEPENDENT_AMBULATORY_CARE_PROVIDER_SITE_OTHER): Payer: Medicare Other | Admitting: Family Medicine

## 2021-05-02 VITALS — BP 140/60 | HR 103 | Temp 97.1°F | Resp 15 | Wt 192.8 lb

## 2021-05-02 DIAGNOSIS — F331 Major depressive disorder, recurrent, moderate: Secondary | ICD-10-CM | POA: Diagnosis not present

## 2021-05-02 DIAGNOSIS — I2510 Atherosclerotic heart disease of native coronary artery without angina pectoris: Secondary | ICD-10-CM | POA: Diagnosis not present

## 2021-05-02 NOTE — Progress Notes (Signed)
Subjective:    Patient ID: [name], female    DOB: November 25, 1940, 80 y.o.   MRN: [id number]  Depression       06/29/20 Patient reports that she feels depressed.  She has been on Cymbalta and bupropion for quite some time.  However for the last several months, she has lost hope.  She states that she has no desire.  She simply sits around her home and has no motivation to leave.  She has no motivation exercise.  All she does is sit in her chair and watch TV.  She is under tremendous stress caring for her husband who has MS as well as dementia.  As result of the stress and the depression she reports memory loss and trouble focusing.  She is having a hard time thinking of words that she wants to say.  She states that she is having trouble sleeping.  Therefore she is self-medicating.  She is taking 2 trazodone 50 mg tablets at night and 100 mg of Benadryl.  I immediately told the patient to stop doing this due to anticholinergic side effects.  Instead I want her to use Xanax 0.5 mg p.o. nightly until we have the depression under control.  She denies any manic thoughts.  She denies any delusions or hallucinations or suicidal ideation or homicidal ideation.  She does report anhedonia and depression and feeling sad.  At that time, my plan was: I believe the stress of caring for her husband with dementia, the loss of her son earlier this year, and her general declining health have all contributed to worsen her depression.  Discontinue Cymbalta as the patient has been on this for many years and start Trintellix 10 mg daily.  Recheck here in 3 weeks or sooner if worsening.  Stop using trazodone and Benadryl at night.  Explained the risk of anticholinergic side effects.  Instead temporarily use Xanax at night to help her self sleep.  See the patient back in 3 weeks.  If this is not working, could consider supplementing with [place] or Abilify.  07/15/20 Patient is not sure that she seen any benefit since  switching from Cymbalta to Trintellix.  However she continues to have trouble sleeping at night.  She states that she wakes up about every day at 3 AM unable to sleep more.  She also reports itching all over her body.  She is scratching and clawing at the skin of the dorsums of her forearms as well as on her anterior shins bilaterally.  There is no visible rash.  There is no erythema.  There is no excoriations.  There is no explanation for the itching on visual inspection of the skin.  There is no evidence of jaundice.  She does have a history of polycythemia and she states that the itching feels similar to when her blood counts would rise in the past.  At that time, my plan was: Physical exam is normal.  Check CBC to rule out polycythemia.  Check CMP to check for kidney or liver abnormalities that can cause itching.  If normal, I question if this could be from reducing her trazodone and Benadryl, stopping her Cymbalta, starting Trintellix.  The other possibility would be "neurodermatitis/neuropathic itching".  Try hydroxyzine 25 mg every 8 hours as needed for itching or for insomnia to see if this helps and await the results of the lab test.  If the depression is not better in 2 weeks and the itching is not better,  I would recommend stopping Trintellix and resuming Cymbalta but then possibly adding Rexulti to Cymbalta.  08/10/20 Patient recently saw my partner and was clearly manic.  She presents today with her daughter.  She states that since I last saw her, she ordered a $13,000 remodel of her bathroom for no particular reason.  Her daughter also states that she was trying to order solar panels for her home.  This is out of character for the patient.  She was spending money unnecessarily and without good reason.  She also reports trouble sleeping.  The patient was taking trazodone, Benadryl, and Xanax independently without my direction trying to help find a way to go to sleep.  She reports racing thoughts in  her mind wandering at night.  She was also on prednisone at the same time for a sinus infection in addition to taking Adderall for ADD.  She has since abruptly stopped Trintellix.  Although she recently started Trintellix she is also been on Effexor for many years prior so I am concerned about serotonin withdrawal.  She is here today for follow-up.  At that time, my plan was: Although she appears much calmer today, I believe that the patient's symptoms now classify mania.  Therefore I believe that she has bipolar if she is clearly had depression in the past.  Therefore we made several changes today.  First we will stop Adderall.  I do not believe that a stimulant is beneficial to a person having insomnia with manic symptoms.  Her daughter will remove this medication from her box.  Second I do not want her to abruptly discontinue Trintellix.  I am concerned about serotonin withdrawal.  Therefore I have asked her to decrease Trintellix gradually 1st-5 milligrams every other day for a week and then 5 mg every third day for a week and then stop.  She will wean herself off of this medication.  Third I recommended that she stop abusing alcohol.  Apparently she has been drinking an excessive amount of wine in an attempt to try to go to sleep.  I believe that this is only adding to the problem.  I also do not want her taking medications independently at home.  Therefore I recommended that she stop all Benadryl, trazodone, hydroxyzine etc.  Instead I feel that we need to focus on treating bipolar mania.  Therefore I will start her on Abilify 5 mg daily and reassess the patient in 1 week.  She is still on bupropion.  We may want to reassess stopping that medication in the future but I hesitate to make too many changes at one time.  I will also consult psychiatry as soon as possible  08/16/20 Patient seems to be much calmer today.  Her thought process is easy to follow and much more logical.  There is no tangential speech  or pressured speech.  She reports improved depression and improved anxiety however she continues to have difficulty sleeping.  She states that she will lay down at night and read in bed however she will simply toss and turn unable to sleep.  She is taking 1-2 Xanax in the evening with no benefit.  She is now off her stimulant.  She is weaning off Trintellix.  At that time, my plan was: Patient's presentation seems much more stable today.  From the standpoint of train of thought, judgment, and impulsivity, the patient seems to be near her baseline.  Her anxiety and depression also seem to be better however she  still not sleeping.  I will keep Abilify at his current dose 5 mg a day.  I will discontinue Xanax and switch to Klonopin 0.5 mg, 1 to 2 tablets at night as needed insomnia.   12/13/20 Patient saw psychiatry who did not feel that she had bipolar.  They weaned her off the Abilify and started her on mirtazapine.  She did not like the way mirtazapine made her feel and she did not follow-up with psychiatry.  She did not make a follow-up appointment.  She now reports depression.  She states that she has anhedonia.  She is overwhelmed with stress caring for her husband who has advanced dementia.  She has anxiety stemming from this as he is argumentative and she frequently has to deal with his complaints and somewhat belligerent attitude.  She reports anhedonia.  She reports decreasing energy.  She reports feeling sad.  She denies any racing thoughts.  She denies any delusions.  She denies any suicidal ideation.  In the past she took Cymbalta for several years without any issue.  At that time, my plan was: Resume Cymbalta 60 mg a day and then reassess in 4 to 6 weeks  04/05/21 Patient is seeing no benefit from resuming Cymbalta or continuing bupropion.  She continues to feel depressed with anhedonia.  She states that she is losing the desire to do anything.  She states that she simply wakes up and sits around all  day watching TV with no desire to leave the home or to do activities around the home or to even exercise.  She blames this on her weight gain.  She reports no sense of enjoyment in life.  She states that the trouble sleeping has improved.  She denies any panic attacks.  However she is certainly way down due to the caring of her husband who has dementia.  This is creating a tremendous physical strain on her.  She denies any delusions.  She denies any racing thoughts.  She denies any suicidal ideation.  However, she reports psychomotor retardation, decreased energy, and decreased concentration.  AT that time, my plan was: Patient is tried and failed numerous therapies in the past.  We discussed increasing Cymbalta to 90 mg a day and resuming a stimulant such as Adderall to help with focus and energy.  We also compared that versus adding Rexulti 1 mg a day and discontinuing bupropion.  Ultimately the patient would like to try adding Rexulti 1 mg a day and discontinuing bupropion.  Reassess in 3 to 4 weeks or sooner if worsening.  Check TSH and baseline labs to rule out hypothyroidism as a contributing factor  05/02/21 Patient feels like the result he may be helping.  She states that she is got more energy now.  She has more motivation and drive.  She still under tremendous stress.  She states that her husband is constantly arguing and fighting with her about everything.  This stems from his dementia.  She is constantly having to explain the situation to him or battle him about his ability to drive or do activities that he has not capable or competent to be doing.  This creates a tremendous amount of stress for her.  She denies any suicidal ideation.  She denies any EPS symptoms.  There is no cogwheel rigidity today on exam.  There is no tremor.  She denies any tardive dyskinesia.  She denies any panic attacks or palpitations although she is having to still take trazodone at  night and Xanax occasionally to help her  sleep.  She does think her concentration has improved some since being on the [place].  Past Medical History:  Diagnosis Date   ADD (attention deficit disorder)    Allergic rhinitis    Anemia    Anxiety    Arrhythmia    Arthritis    "hands, neck, right shoulder" (07/28/2015)   Aseptic necrosis ([place])    Asthma dxc'd 05/2015   Basal cell cancer    Bleeding stomach ulcer ~ 06/2005   COPD (chronic obstructive pulmonary disease) (HCC)    Depression    Diverticula of colon    Dyspnea    Esophagitis    Gastric ulcer with hemorrhage    GERD (gastroesophageal reflux disease)    Heart murmur    History of blood transfusion 06/2005   "related to pneumonia"   History of kidney stones    Hyperlipidemia    Hypertension    Hypothyroidism    Kidney stones    Lung cancer ([place])    Non small cell lung cancer, adenocarcinoma with BAC 09/2009, VATS converted to wedge resection of left upper lobe mass   Mitral valve prolapse    "no ORs" (07/28/2015)   Osteopenia    Pneumonia 1970's; 06/2005   Sleep apnea dx'd 06/2015   on oxygen at nite at 2L/[place]    Squamous cell skin cancer    Past Surgical History:  Procedure Laterality Date   ABDOMINAL HYSTERECTOMY  1988   COLONOSCOPY WITH PROPOFOL N/A 06/15/2017   Procedure: COLONOSCOPY WITH PROPOFOL;  Surgeon: Carol Ada, MD;  Location: [facility];  Service: Endoscopy;  Laterality: N/A;   HAMMER TOE SURGERY Right ~ 2010   SALPINGOOPHORECTOMY Left 1988   SKIN CANCER EXCISION     "I've had severl cut off RLE; left foreqrm, nose under nose"   THORACOTOMY Left 06/2005   THORACOTOMY/LOBECTOMY  09/2009; 11/2009   left; right   Current Outpatient Medications on File Prior to Visit  Medication Sig Dispense Refill   acetaminophen (TYLENOL) 325 MG tablet Take by mouth.     albuterol (VENTOLIN HFA) 108 (90 Base) MCG/ACT inhaler Inhale 1 puff into the lungs every 6 (six) hours as needed for wheezing or shortness of breath.     ALPRAZolam (XANAX) 0.5 MG  tablet Take 1 tablet (0.5 mg total) by mouth at bedtime as needed for sleep. 30 tablet 3   brexpiprazole (REXULTI) 1 MG TABS tablet Take 1 tablet (1 mg total) by mouth daily. 30 tablet 3   buPROPion (WELLBUTRIN XL) 150 MG 24 hr tablet Take 1 tablet (150 mg total) by mouth daily. 90 tablet 3   Calcium Carbonate-Vit D-Min (CALCIUM 1200 PO) Take 50 mg by mouth. ONCE A WEEK     CLINPRO 5000 1.1 % PSTE Place onto teeth.     co-enzyme Q-10 30 MG capsule Take 100 mg by mouth 3 (three) times daily.     Cyanocobalamin 1000 MCG CAPS Take by mouth.     doxazosin (CARDURA) 4 MG tablet TAKE 1 TABLET DAILY 90 tablet 3   DULoxetine (CYMBALTA) 60 MG capsule Take 1 capsule (60 mg total) by mouth daily. 90 capsule 3   fluticasone (FLONASE) 50 MCG/ACT nasal spray USE 2 SPRAYS IN EACH NOSTRIL DAILY 48 g 3   LEVOXYL 112 MCG tablet TAKE 1 TABLET DAILY (DISCONTINUE LEVOTHYROXINE 100) 90 tablet 3   losartan-hydrochlorothiazide (HYZAAR) 100-25 MG tablet TAKE 1 TABLET DAILY 90 tablet 3   Multiple Vitamins-Minerals (EMERGEN-C  FIVE PO) Take by mouth.     omeprazole (PRILOSEC) 20 MG capsule Take 1 capsule (20 mg total) by mouth 2 (two) times daily before a meal. 90 capsule 3   OXYGEN 2lpm with sleep     rosuvastatin (CRESTOR) 20 MG tablet Take 1 tablet (20 mg total) by mouth daily. 90 tablet 3   thiamine (VITAMIN B-1) 100 MG tablet Take 100 mg by mouth daily.     traZODone (DESYREL) 50 MG tablet Take 1 tablet (50 mg total) by mouth at bedtime as needed for sleep. 90 tablet 3   TRELEGY ELLIPTA 100-62.5-25 MCG/INH AEPB USE 1 INHALATION DAILY (DISCONTINUE BEVESPI) 180 each 3   triamcinolone (KENALOG) 0.1 % Apply 1 application topically 2 (two) times daily. 453 g 0   No current facility-administered medications on file prior to visit.    Allergies  Allergen Reactions   Amoxicillin     Diarrhea, doesnt tolerate    Doxycycline Nausea Only   Erythromycin Nausea Only   Sulfonamide Derivatives Hives   Social History    Socioeconomic History   Marital status: Married    Spouse name: Not on file   Number of children: 3   Years of education: Not on file   Highest education level: Not on file  Occupational History   Occupation: retired Optician, dispensing: RETIRED  Tobacco Use   Smoking status: Former    Packs/day: 0.75    Years: 30.00    Pack years: 22.50    Types: Cigarettes    Quit date: 09/11/2004    Years since quitting: 16.6   Smokeless tobacco: Never  Vaping Use   Vaping Use: Never used  Substance and Sexual Activity   Alcohol use: Yes    Alcohol/week: 7.0 standard drinks    Types: 7 Shots of liquor per week    Comment: 1-2 drinks nightly    Drug use: No   Sexual activity: Yes  Other Topics Concern   Not on file  Social History Narrative   Not on file   Social Determinants of Health   Financial Resource Strain: Low Risk    Difficulty of Paying Living Expenses: Not hard at all  Food Insecurity: No Food Insecurity   Worried About Charity fundraiser in the Last Year: Never true   [place] in the Last Year: Never true  Transportation Needs: No Transportation Needs   Lack of Transportation (Medical): No   Lack of Transportation (Non-Medical): No  Physical Activity: Inactive   Days of Exercise per Week: 0 days   Minutes of Exercise per Session: 0 min  Stress: No Stress Concern Present   Feeling of Stress : Not at all  Social Connections: Moderately Isolated   Frequency of Communication with Friends and Family: Twice a week   Frequency of Social Gatherings with Friends and Family: More than three times a week   Attends Religious Services: Never   Marine scientist or Organizations: No   Attends Music therapist: Never   Marital Status: Married  Human resources officer Violence: Not At Risk   Fear of Current or Ex-Partner: No   Emotionally Abused: No   Physically Abused: No   Sexually Abused: No      Review of Systems  All other systems reviewed and are  negative.     Objective:   Physical Exam Vitals reviewed.  Constitutional:      General: She is not in acute distress.  Appearance: Normal appearance. She is normal weight. She is not ill-appearing or toxic-appearing.  Cardiovascular:     Rate and Rhythm: Normal rate and regular rhythm.  Pulmonary:     Effort: Pulmonary effort is normal.     Breath sounds: Normal breath sounds.  Neurological:     Mental Status: She is alert.          Assessment & Plan:  Moderate episode of recurrent major depressive disorder ([place]) I am encouraged by the improvement that she is seen on the Rexulti 1 mg a day combined with Cymbalta.  I encouraged her to continue this.  I also encouraged her to try to get 1 hour/day of exercise doing anything.  I think this would serve for several different functions.  First I think it would get her out of the house and away from her husband.  I believe that she needs time to decompress and relax.  In that regard I recommended that she ignore certain things and pick her battles only when absolutely necessary as she is unable to reason with him due to his dementia.  Second I think that this would also help her lose weight which I think would help her self-confidence.  Third I think it would help her breathing as it would reduce some of the pressure that her abdominal pannus is placing on her thoracic cavity.  Therefore I encouraged her to start trying to exercise daily for all of these reasons.

## 2021-05-06 ENCOUNTER — Telehealth: Payer: Self-pay

## 2021-05-06 NOTE — Telephone Encounter (Signed)
Pt called in stating that she just wanted to inform Dr. Dennard Schaumann that she has ordered her own [employer] Oxygen machine. Pt stated that Dr. Should be receiving more info on this via fax. Please advise.  Cb#: [phone]

## 2021-05-06 NOTE — Telephone Encounter (Signed)
Call placed to patient.   Patient reports that she is using stationary oxygen at night. States that she would like to have portable oxygen as she looses her breath easily when walking.   Advised OV will be required. Appointment scheduled.

## 2021-05-09 ENCOUNTER — Telehealth: Payer: Self-pay | Admitting: Family Medicine

## 2021-05-09 NOTE — Telephone Encounter (Signed)
Spoke with nurse regarding paperwork; patient needs an appointment before form can be completed. Patient scheduled to come in on Thursday, 9/1 which is her earliest availability. Outbound call placed to notify Jori Moll at [facility]; no answer. Left detailed message on his voicemail.

## 2021-05-09 NOTE — Telephone Encounter (Signed)
Patient has an appointment for face to face on Thursday, 05/12/2021.

## 2021-05-09 NOTE — Telephone Encounter (Signed)
Received call from Jori Moll at [facility] to follow up on faxed document received Friday 8/26; requires provider's signature for patient to receive portable oxygen; time sensitive. Fax sent again this morning and placed on provider's desk; requires signature and date. Please advise at [phone].

## 2021-05-11 ENCOUNTER — Encounter: Payer: Self-pay | Admitting: Family Medicine

## 2021-05-11 ENCOUNTER — Telehealth: Payer: Self-pay

## 2021-05-11 DIAGNOSIS — H9190 Unspecified hearing loss, unspecified ear: Secondary | ICD-10-CM

## 2021-05-11 DIAGNOSIS — H903 Sensorineural hearing loss, bilateral: Secondary | ICD-10-CM | POA: Diagnosis not present

## 2021-05-11 NOTE — Telephone Encounter (Signed)
Pt called in stating that she went to [facility] and did not realize that she needed a referral to be seen at this office. Pt wanted to know if dr/nurse can fax over a referral for her visit today. Pt stated if this is possible please make sure to date the referral with today's date (05/11/2021) if not being faxed today. Please fax referral to: [facility] [phone]. Please advise.  Cb#: [phone]

## 2021-05-11 NOTE — Telephone Encounter (Signed)
Ok to place referral.

## 2021-05-12 ENCOUNTER — Other Ambulatory Visit: Payer: Self-pay

## 2021-05-12 ENCOUNTER — Encounter: Payer: Self-pay | Admitting: Family Medicine

## 2021-05-12 ENCOUNTER — Ambulatory Visit (INDEPENDENT_AMBULATORY_CARE_PROVIDER_SITE_OTHER): Payer: Medicare Other | Admitting: Family Medicine

## 2021-05-12 VITALS — BP 134/68 | HR 90 | Temp 98.7°F | Resp 20 | Ht 67.0 in | Wt 190.0 lb

## 2021-05-12 DIAGNOSIS — Z902 Acquired absence of lung [part of]: Secondary | ICD-10-CM | POA: Diagnosis not present

## 2021-05-12 DIAGNOSIS — J439 Emphysema, unspecified: Secondary | ICD-10-CM

## 2021-05-12 DIAGNOSIS — I2510 Atherosclerotic heart disease of native coronary artery without angina pectoris: Secondary | ICD-10-CM | POA: Diagnosis not present

## 2021-05-12 DIAGNOSIS — C3480 Malignant neoplasm of overlapping sites of unspecified bronchus and lung: Secondary | ICD-10-CM | POA: Diagnosis not present

## 2021-05-12 MED ORDER — BREXPIPRAZOLE 1 MG PO TABS: 1.0000 mg | ORAL_TABLET | Freq: Every day | ORAL | 3 refills | Status: DC

## 2021-05-12 MED ORDER — OMEPRAZOLE 20 MG PO CPDR: 20.0000 mg | DELAYED_RELEASE_CAPSULE | Freq: Every day | ORAL | 3 refills | Status: DC

## 2021-05-12 NOTE — Telephone Encounter (Signed)
Referral orders placed

## 2021-05-12 NOTE — Progress Notes (Signed)
Subjective:    Patient ID: [name], female    DOB: March 30, 1941, 80 y.o.   MRN: [id number]  Patient has a history of COPD.  She also has a history of non-small cell lung cancer status post right upper lobectomy as well as a left wedge resection in 2011.  As result, she has chronic dyspnea.  At 1 point she was on portable oxygen.  She is here today trying to get approval for [employer] portable oxygen.  She reports dyspnea on exertion despite using Trelegy.  She states that if she has to walk 20 feet she is short of breath but she is able to accomplish this.  However if she has to walk extended duration such as when she has to carry her husband to the [place] and they have to walk around the hospital or whenever she is doing errands around her home, she becomes extremely winded and has to stop and rest simply to catch her breath.  Today in the office she is 93% on room air.  She is 95% on room air at 1 lap which is approximately 60 feet, she drops to 93% on room air on the second lap, 90% on the third lap and 88% on the fourth lap which is about 200 feet.  She quickly returns to 94% on room air at rest although she is tachycardic at 110 bpm and is extremely winded. Past Medical History:  Diagnosis Date   ADD (attention deficit disorder)    Allergic rhinitis    Anemia    Anxiety    Arrhythmia    Arthritis    "hands, neck, right shoulder" (07/28/2015)   Aseptic necrosis ([place])    Asthma dxc'd 05/2015   Basal cell cancer    Bleeding stomach ulcer ~ 06/2005   COPD (chronic obstructive pulmonary disease) (HCC)    Depression    Diverticula of colon    Dyspnea    Esophagitis    Gastric ulcer with hemorrhage    GERD (gastroesophageal reflux disease)    Heart murmur    History of blood transfusion 06/2005   "related to pneumonia"   History of kidney stones    Hyperlipidemia    Hypertension    Hypothyroidism    Kidney stones    Lung cancer ([place])    Non small cell lung cancer,  adenocarcinoma with BAC 09/2009, VATS converted to wedge resection of left upper lobe mass   Mitral valve prolapse    "no ORs" (07/28/2015)   Osteopenia    Pneumonia 1970's; 06/2005   Sleep apnea dx'd 06/2015   on oxygen at nite at 2L/[place]    Squamous cell skin cancer    Past Surgical History:  Procedure Laterality Date   ABDOMINAL HYSTERECTOMY  1988   COLONOSCOPY WITH PROPOFOL N/A 06/15/2017   Procedure: COLONOSCOPY WITH PROPOFOL;  Surgeon: Carol Ada, MD;  Location: [facility];  Service: Endoscopy;  Laterality: N/A;   HAMMER TOE SURGERY Right ~ 2010   SALPINGOOPHORECTOMY Left 1988   SKIN CANCER EXCISION     "I've had severl cut off RLE; left foreqrm, nose under nose"   THORACOTOMY Left 06/2005   THORACOTOMY/LOBECTOMY  09/2009; 11/2009   left; right   Current Outpatient Medications on File Prior to Visit  Medication Sig Dispense Refill   acetaminophen (TYLENOL) 325 MG tablet Take by mouth.     albuterol (VENTOLIN HFA) 108 (90 Base) MCG/ACT inhaler Inhale 1 puff into the lungs every 6 (six) hours as needed  for wheezing or shortness of breath.     ALPRAZolam (XANAX) 0.5 MG tablet Take 1 tablet (0.5 mg total) by mouth at bedtime as needed for sleep. 30 tablet 3   brexpiprazole (REXULTI) 1 MG TABS tablet Take 1 tablet (1 mg total) by mouth daily. 30 tablet 3   Calcium Carbonate-Vit D-Min (CALCIUM 1200 PO) Take 50 mg by mouth. ONCE A WEEK     CLINPRO 5000 1.1 % PSTE Place onto teeth.     co-enzyme Q-10 30 MG capsule Take 100 mg by mouth 3 (three) times daily.     Cyanocobalamin 1000 MCG CAPS Take by mouth.     doxazosin (CARDURA) 4 MG tablet TAKE 1 TABLET DAILY 90 tablet 3   DULoxetine (CYMBALTA) 60 MG capsule Take 1 capsule (60 mg total) by mouth daily. 90 capsule 3   fluticasone (FLONASE) 50 MCG/ACT nasal spray USE 2 SPRAYS IN EACH NOSTRIL DAILY 48 g 3   LEVOXYL 112 MCG tablet TAKE 1 TABLET DAILY (DISCONTINUE LEVOTHYROXINE 100) 90 tablet 3   losartan-hydrochlorothiazide (HYZAAR)  100-25 MG tablet TAKE 1 TABLET DAILY 90 tablet 3   Multiple Vitamins-Minerals (EMERGEN-C FIVE PO) Take by mouth.     omeprazole (PRILOSEC) 20 MG capsule Take 1 capsule (20 mg total) by mouth 2 (two) times daily before a meal. 90 capsule 3   OXYGEN 2lpm with sleep     rosuvastatin (CRESTOR) 20 MG tablet Take 1 tablet (20 mg total) by mouth daily. 90 tablet 3   thiamine (VITAMIN B-1) 100 MG tablet Take 100 mg by mouth daily.     traZODone (DESYREL) 50 MG tablet Take 1 tablet (50 mg total) by mouth at bedtime as needed for sleep. 90 tablet 3   TRELEGY ELLIPTA 100-62.5-25 MCG/INH AEPB USE 1 INHALATION DAILY (DISCONTINUE BEVESPI) 180 each 3   triamcinolone (KENALOG) 0.1 % Apply 1 application topically 2 (two) times daily. 453 g 0   No current facility-administered medications on file prior to visit.    Allergies  Allergen Reactions   Amoxicillin     Diarrhea, doesnt tolerate    Doxycycline Nausea Only   Erythromycin Nausea Only   Sulfonamide Derivatives Hives   Social History   Socioeconomic History   Marital status: Married    Spouse name: Not on file   Number of children: 3   Years of education: Not on file   Highest education level: Not on file  Occupational History   Occupation: retired Optician, dispensing: RETIRED  Tobacco Use   Smoking status: Former    Packs/day: 0.75    Years: 30.00    Pack years: 22.50    Types: Cigarettes    Quit date: 09/11/2004    Years since quitting: 16.6   Smokeless tobacco: Never  Vaping Use   Vaping Use: Never used  Substance and Sexual Activity   Alcohol use: Yes    Alcohol/week: 7.0 standard drinks    Types: 7 Shots of liquor per week    Comment: 1-2 drinks nightly    Drug use: No   Sexual activity: Yes  Other Topics Concern   Not on file  Social History Narrative   Not on file   Social Determinants of Health   Financial Resource Strain: Low Risk    Difficulty of Paying Living Expenses: Not hard at all  Food Insecurity: No Food  Insecurity   Worried About Charity fundraiser in the Last Year: Never true   Ran Out of  Food in the Last Year: Never true  Transportation Needs: No Transportation Needs   Lack of Transportation (Medical): No   Lack of Transportation (Non-Medical): No  Physical Activity: Inactive   Days of Exercise per Week: 0 days   Minutes of Exercise per Session: 0 min  Stress: No Stress Concern Present   Feeling of Stress : Not at all  Social Connections: Moderately Isolated   Frequency of Communication with Friends and Family: Twice a week   Frequency of Social Gatherings with Friends and Family: More than three times a week   Attends Religious Services: Never   Marine scientist or Organizations: No   Attends Music therapist: Never   Marital Status: Married  Human resources officer Violence: Not At Risk   Fear of Current or Ex-Partner: No   Emotionally Abused: No   Physically Abused: No   Sexually Abused: No      Review of Systems  All other systems reviewed and are negative.     Objective:   Physical Exam Vitals reviewed.  Constitutional:      General: She is not in acute distress.    Appearance: Normal appearance. She is normal weight. She is not ill-appearing or toxic-appearing.  Cardiovascular:     Rate and Rhythm: Normal rate and regular rhythm.  Pulmonary:     Effort: Pulmonary effort is normal.     Breath sounds: Normal breath sounds.  Neurological:     Mental Status: She is alert.          Assessment & Plan:  Pulmonary emphysema, unspecified emphysema type ([place])  S/P lobectomy of lung  Malignant neoplasm of overlapping sites of lung, unspecified laterality [facility]) Patient qualifies for oxygen 2 L via nasal cannula with activity.  Despite being on maximum medical therapy including Trelegy and albuterol, the patient continues to have subjective dyspnea and objective hypoxia after walking 200 feet.  Therefore we will try to get her qualified for portable  oxygen.

## 2021-05-26 ENCOUNTER — Encounter: Payer: Self-pay | Admitting: Family Medicine

## 2021-05-26 MED ORDER — ALPRAZOLAM 0.5 MG PO TABS: 0.5000 mg | ORAL_TABLET | Freq: Every evening | ORAL | 3 refills | Status: DC | PRN

## 2021-05-26 NOTE — Telephone Encounter (Signed)
Ok to refill??  Last office visit 05/12/2021.  Last refill 02/08/2021, #3 refills.   Patient is requesting to increase quantity. Please advise.

## 2021-06-03 ENCOUNTER — Other Ambulatory Visit: Payer: Self-pay

## 2021-06-03 ENCOUNTER — Ambulatory Visit (INDEPENDENT_AMBULATORY_CARE_PROVIDER_SITE_OTHER): Payer: Medicare Other | Admitting: Family Medicine

## 2021-06-03 VITALS — BP 128/68 | HR 100 | Temp 97.9°F | Resp 20 | Ht 67.0 in | Wt 193.0 lb

## 2021-06-03 DIAGNOSIS — J019 Acute sinusitis, unspecified: Secondary | ICD-10-CM

## 2021-06-03 DIAGNOSIS — I2510 Atherosclerotic heart disease of native coronary artery without angina pectoris: Secondary | ICD-10-CM

## 2021-06-03 MED ORDER — CEFDINIR 300 MG PO CAPS: 300.0000 mg | ORAL_CAPSULE | Freq: Two times a day (BID) | ORAL | 0 refills | Status: DC

## 2021-06-03 NOTE — Progress Notes (Signed)
Subjective:    Patient ID: [name], female    DOB: 03/01/1941, 80 y.o.   MRN: [id number]  Patient reports a 7-day history of sinus congestion.  She reports pressure and pain in her right frontal sinus.  She reports postnasal drip and head congestion.  Both ears feel stopped up and she has decreased hearing in both ears.  She reports subjective fevers.  She denies any sore throat.  She denies any cough.  She denies any purulent sputum.  She denies any shortness of breath or chest pain Past Medical History:  Diagnosis Date   ADD (attention deficit disorder)    Allergic rhinitis    Anemia    Anxiety    Arrhythmia    Arthritis    "hands, neck, right shoulder" (07/28/2015)   Aseptic necrosis ([place])    Asthma dxc'd 05/2015   Basal cell cancer    Bleeding stomach ulcer ~ 06/2005   COPD (chronic obstructive pulmonary disease) ([place])    Depression    Diverticula of colon    Dyspnea    Esophagitis    Gastric ulcer with hemorrhage    GERD (gastroesophageal reflux disease)    Heart murmur    History of blood transfusion 06/2005   "related to pneumonia"   History of kidney stones    Hyperlipidemia    Hypertension    Hypothyroidism    Kidney stones    Lung cancer ([place])    Non small cell lung cancer, adenocarcinoma with BAC 09/2009, VATS converted to wedge resection of left upper lobe mass   Mitral valve prolapse    "no ORs" (07/28/2015)   Osteopenia    Pneumonia 1970's; 06/2005   Sleep apnea dx'd 06/2015   on oxygen at nite at 2L/[place]    Squamous cell skin cancer    Past Surgical History:  Procedure Laterality Date   ABDOMINAL HYSTERECTOMY  1988   COLONOSCOPY WITH PROPOFOL N/A 06/15/2017   Procedure: COLONOSCOPY WITH PROPOFOL;  Surgeon: Carol Ada, MD;  [facility];  Service: Endoscopy;  Laterality: N/A;   HAMMER TOE SURGERY Right ~ 2010   SALPINGOOPHORECTOMY Left 1988   SKIN CANCER EXCISION     "I've had severl cut off RLE; left foreqrm, nose under nose"    THORACOTOMY Left 06/2005   THORACOTOMY/LOBECTOMY  09/2009; 11/2009   left; right   Current Outpatient Medications on File Prior to Visit  Medication Sig Dispense Refill   acetaminophen (TYLENOL) 325 MG tablet Take by mouth.     albuterol (VENTOLIN HFA) 108 (90 Base) MCG/ACT inhaler Inhale 1 puff into the lungs every 6 (six) hours as needed for wheezing or shortness of breath.     ALPRAZolam (XANAX) 0.5 MG tablet Take 1 tablet (0.5 mg total) by mouth at bedtime as needed for sleep. 30 tablet 3   brexpiprazole (REXULTI) 1 MG TABS tablet Take 1 tablet (1 mg total) by mouth daily. 90 tablet 3   Calcium Carbonate-Vit D-Min (CALCIUM 1200 PO) Take 50 mg by mouth. ONCE A WEEK     CLINPRO 5000 1.1 % PSTE Place onto teeth.     co-enzyme Q-10 30 MG capsule Take 100 mg by mouth 3 (three) times daily.     Cyanocobalamin 1000 MCG CAPS Take by mouth.     doxazosin (CARDURA) 4 MG tablet TAKE 1 TABLET DAILY 90 tablet 3   DULoxetine (CYMBALTA) 60 MG capsule Take 1 capsule (60 mg total) by mouth daily. 90 capsule 3   fluticasone (  FLONASE) 50 MCG/ACT nasal spray USE 2 SPRAYS IN EACH NOSTRIL DAILY 48 g 3   LEVOXYL 112 MCG tablet TAKE 1 TABLET DAILY (DISCONTINUE LEVOTHYROXINE 100) 90 tablet 3   losartan-hydrochlorothiazide (HYZAAR) 100-25 MG tablet TAKE 1 TABLET DAILY 90 tablet 3   Multiple Vitamins-Minerals (EMERGEN-C FIVE PO) Take by mouth.     omeprazole (PRILOSEC) 20 MG capsule Take 1 capsule (20 mg total) by mouth daily. 90 capsule 3   OXYGEN 2lpm with sleep     rosuvastatin (CRESTOR) 20 MG tablet Take 1 tablet (20 mg total) by mouth daily. 90 tablet 3   thiamine (VITAMIN B-1) 100 MG tablet Take 100 mg by mouth daily.     traZODone (DESYREL) 50 MG tablet Take 1 tablet (50 mg total) by mouth at bedtime as needed for sleep. 90 tablet 3   TRELEGY ELLIPTA 100-62.5-25 MCG/INH AEPB USE 1 INHALATION DAILY (DISCONTINUE BEVESPI) 180 each 3   triamcinolone (KENALOG) 0.1 % Apply 1 application topically 2 (two) times  daily. 453 g 0   No current facility-administered medications on file prior to visit.    Allergies  Allergen Reactions   Amoxicillin     Diarrhea, doesnt tolerate    Doxycycline Nausea Only   Erythromycin Nausea Only   Sulfonamide Derivatives Hives   Social History   Socioeconomic History   Marital status: Married    Spouse name: Not on file   Number of children: 3   Years of education: Not on file   Highest education level: Not on file  Occupational History   Occupation: retired Optician, dispensing: RETIRED  Tobacco Use   Smoking status: Former    Packs/day: 0.75    Years: 30.00    Pack years: 22.50    Types: Cigarettes    Quit date: 09/11/2004    Years since quitting: 16.7   Smokeless tobacco: Never  Vaping Use   Vaping Use: Never used  Substance and Sexual Activity   Alcohol use: Yes    Alcohol/week: 7.0 standard drinks    Types: 7 Shots of liquor per week    Comment: 1-2 drinks nightly    Drug use: No   Sexual activity: Yes  Other Topics Concern   Not on file  Social History Narrative   Not on file   Social Determinants of Health   Financial Resource Strain: Low Risk    Difficulty of Paying Living Expenses: Not hard at all  Food Insecurity: No Food Insecurity   Worried About Charity fundraiser in the Last Year: Never true   [place] in the Last Year: Never true  Transportation Needs: No Transportation Needs   Lack of Transportation (Medical): No   Lack of Transportation (Non-Medical): No  Physical Activity: Inactive   Days of Exercise per Week: 0 days   Minutes of Exercise per Session: 0 min  Stress: No Stress Concern Present   Feeling of Stress : Not at all  Social Connections: Moderately Isolated   Frequency of Communication with Friends and Family: Twice a week   Frequency of Social Gatherings with Friends and Family: More than three times a week   Attends Religious Services: Never   Marine scientist or Organizations: No   Attends  Archivist Meetings: Never   Marital Status: Married  Human resources officer Violence: Not At Risk   Fear of Current or Ex-Partner: No   Emotionally Abused: No   Physically Abused: No   Sexually  Abused: No      Review of Systems  All other systems reviewed and are negative.     Objective:   Physical Exam Vitals reviewed.  Constitutional:      General: She is not in acute distress.    Appearance: Normal appearance. She is normal weight. She is not ill-appearing or toxic-appearing.  HENT:     Nose:     Right Turbinates: Enlarged and swollen.     Left Turbinates: Enlarged and swollen.     Right Sinus: Frontal sinus tenderness present.  Cardiovascular:     Rate and Rhythm: Normal rate and regular rhythm.  Pulmonary:     Effort: Pulmonary effort is normal.     Breath sounds: Normal breath sounds.  Neurological:     Mental Status: She is alert.          Assessment & Plan:  Acute rhinosinusitis Begin Omnicef 300 mg p.o. twice daily for 10 days.

## 2021-06-10 ENCOUNTER — Encounter: Payer: Self-pay | Admitting: Family Medicine

## 2021-06-10 ENCOUNTER — Other Ambulatory Visit: Payer: Self-pay | Admitting: Family Medicine

## 2021-06-10 ENCOUNTER — Telehealth: Payer: Self-pay

## 2021-06-10 MED ORDER — AZITHROMYCIN 250 MG PO TABS: ORAL_TABLET | ORAL | 0 refills | Status: DC

## 2021-06-10 NOTE — Telephone Encounter (Signed)
Pt called in stating that she was recently seen in the office but is still not feeling well, and would like to know if dr could call in a zpak or some other meds that may help to [facility] on [address]. Please advise.  Cb#: [id number]

## 2021-06-29 ENCOUNTER — Telehealth: Payer: Self-pay | Admitting: *Deleted

## 2021-06-29 NOTE — Chronic Care Management (AMB) (Signed)
  Chronic Care Management   Note  06/29/2021 Name: [name] MRN: [id number] DOB: 1941-08-13  [name] is a 80 y.o. year old female who is a primary care patient of Dennard Schaumann, Cammie Mcgee, MD. I reached out to [name] by phone today in response to a referral sent by Ms. Bartholome Bill Charters's PCP.  Ms. Merida was given information about Chronic Care Management services today including:  CCM service includes personalized support from designated clinical staff supervised by her physician, including individualized plan of care and coordination with other care providers 24/7 contact phone numbers for assistance for urgent and routine care needs. Service will only be billed when office clinical staff spend 20 minutes or more in a month to coordinate care. Only one practitioner may furnish and bill the service in a calendar month. The patient may stop CCM services at any time (effective at the end of the month) by phone call to the office staff. The patient is responsible for co-pay (up to 20% after annual deductible is met) if co-pay is required by the individual health plan.   Patient did not agree to enrollment in care management services and does not wish to consider at this time due to cost.  Follow up plan: Patient declines engagement by the care management team. Appropriate care team members and provider have been notified via electronic communication. The care management team is available to follow up with the patient after provider conversation with the patient regarding recommendation for care management engagement and subsequent re-referral to the care management team.   [facility]  Direct Dial: [phone]

## 2021-07-19 DIAGNOSIS — Z08 Encounter for follow-up examination after completed treatment for malignant neoplasm: Secondary | ICD-10-CM | POA: Diagnosis not present

## 2021-07-19 DIAGNOSIS — D492 Neoplasm of unspecified behavior of bone, soft tissue, and skin: Secondary | ICD-10-CM | POA: Diagnosis not present

## 2021-07-19 DIAGNOSIS — L814 Other melanin hyperpigmentation: Secondary | ICD-10-CM | POA: Diagnosis not present

## 2021-07-19 DIAGNOSIS — L821 Other seborrheic keratosis: Secondary | ICD-10-CM | POA: Diagnosis not present

## 2021-07-19 DIAGNOSIS — Z85828 Personal history of other malignant neoplasm of skin: Secondary | ICD-10-CM | POA: Diagnosis not present

## 2021-07-19 DIAGNOSIS — D225 Melanocytic nevi of trunk: Secondary | ICD-10-CM | POA: Diagnosis not present

## 2021-07-19 DIAGNOSIS — L570 Actinic keratosis: Secondary | ICD-10-CM | POA: Diagnosis not present

## 2021-08-03 ENCOUNTER — Encounter: Payer: Self-pay | Admitting: Podiatry

## 2021-08-03 ENCOUNTER — Ambulatory Visit (INDEPENDENT_AMBULATORY_CARE_PROVIDER_SITE_OTHER): Payer: Medicare Other | Admitting: Podiatry

## 2021-08-03 ENCOUNTER — Other Ambulatory Visit: Payer: Self-pay

## 2021-08-03 DIAGNOSIS — I2510 Atherosclerotic heart disease of native coronary artery without angina pectoris: Secondary | ICD-10-CM | POA: Diagnosis not present

## 2021-08-03 DIAGNOSIS — L600 Ingrowing nail: Secondary | ICD-10-CM

## 2021-08-03 DIAGNOSIS — M2042 Other hammer toe(s) (acquired), left foot: Secondary | ICD-10-CM

## 2021-08-03 DIAGNOSIS — M21619 Bunion of unspecified foot: Secondary | ICD-10-CM

## 2021-08-03 NOTE — Patient Instructions (Addendum)

## 2021-08-03 NOTE — Progress Notes (Signed)
Subjective:   Patient ID: [name], female   DOB: 80 y.o.   MRN: [id number]   HPI Patient presents stating she has a painful ingrown toenail of her right big toe and has bunion deformity left over right with hammertoe deformity also noted.  States she tried to trim and soak it without relief had it fixed a number of years ago but it is gradually returned over the last year.  Patient does not smoke likes to be active   Review of Systems  All other systems reviewed and are negative.      Objective:  Physical Exam Vitals and nursing note reviewed.  Constitutional:      Appearance: She is well-developed.  Pulmonary:     Effort: Pulmonary effort is normal.  Musculoskeletal:        General: Normal range of motion.  Skin:    General: Skin is warm.  Neurological:     Mental Status: She is alert.    Neurovascular status was found to be intact muscle strength found to be adequate range of motion adequate with incurvated medial border the right hallux painful when pressed no active drainage or redness noted.  Patient has significant structural bunion deformity left over right and elevated second toe left over right foot with good digital perfusion well oriented x3     Assessment:  Ingrown toenail deformity right hallux medial border along with structural changes both the     Plan:  H&P x-rays reviewed recommended correction of right foot explained procedure risk and patient wants surgery.  Patient understands risk of healing and signed consent form and today I infiltrated the right hallux 60 mg like Marcaine mixture sterile prep done and using sterile instrumentation remove the medial border exposed matrix applied phenol 3 applications 30 seconds followed by alcohol by sterile dressing gave instructions on soaks leave dressing on 24 hours take off earlier if throbbing were to occur and encouraged her to call with questions concerns

## 2021-08-15 ENCOUNTER — Other Ambulatory Visit: Payer: Self-pay | Admitting: Family Medicine

## 2021-08-15 DIAGNOSIS — W540XXA Bitten by dog, initial encounter: Secondary | ICD-10-CM | POA: Diagnosis not present

## 2021-08-15 DIAGNOSIS — E2839 Other primary ovarian failure: Secondary | ICD-10-CM

## 2021-08-15 DIAGNOSIS — S61402A Unspecified open wound of left hand, initial encounter: Secondary | ICD-10-CM | POA: Diagnosis not present

## 2021-08-15 DIAGNOSIS — L0390 Cellulitis, unspecified: Secondary | ICD-10-CM | POA: Diagnosis not present

## 2021-08-15 DIAGNOSIS — M810 Age-related osteoporosis without current pathological fracture: Secondary | ICD-10-CM

## 2021-08-18 ENCOUNTER — Ambulatory Visit
Admission: RE | Admit: 2021-08-18 | Discharge: 2021-08-18 | Disposition: A | Discharge status: Home / Self Care | Payer: Medicare Other | Source: Ambulatory Visit | Attending: Family Medicine | Admitting: Family Medicine

## 2021-08-18 DIAGNOSIS — M810 Age-related osteoporosis without current pathological fracture: Secondary | ICD-10-CM

## 2021-08-18 DIAGNOSIS — Z1231 Encounter for screening mammogram for malignant neoplasm of breast: Secondary | ICD-10-CM

## 2021-08-18 DIAGNOSIS — Z780 Asymptomatic menopausal state: Secondary | ICD-10-CM | POA: Diagnosis not present

## 2021-08-18 DIAGNOSIS — E2839 Other primary ovarian failure: Secondary | ICD-10-CM

## 2021-08-18 DIAGNOSIS — M8589 Other specified disorders of bone density and structure, multiple sites: Secondary | ICD-10-CM | POA: Diagnosis not present

## 2021-08-23 DIAGNOSIS — H40013 Open angle with borderline findings, low risk, bilateral: Secondary | ICD-10-CM | POA: Diagnosis not present

## 2021-08-23 DIAGNOSIS — H04123 Dry eye syndrome of bilateral lacrimal glands: Secondary | ICD-10-CM | POA: Diagnosis not present

## 2021-08-23 DIAGNOSIS — H35033 Hypertensive retinopathy, bilateral: Secondary | ICD-10-CM | POA: Diagnosis not present

## 2021-08-23 DIAGNOSIS — H57052 Tonic pupil, left eye: Secondary | ICD-10-CM | POA: Diagnosis not present

## 2021-08-30 ENCOUNTER — Other Ambulatory Visit: Payer: Self-pay

## 2021-08-30 ENCOUNTER — Ambulatory Visit (INDEPENDENT_AMBULATORY_CARE_PROVIDER_SITE_OTHER): Payer: Medicare Other | Admitting: Family Medicine

## 2021-08-30 ENCOUNTER — Encounter: Payer: Self-pay | Admitting: Family Medicine

## 2021-08-30 VITALS — BP 138/68 | HR 94 | Temp 96.8°F | Resp 19 | Ht 67.0 in | Wt 204.0 lb

## 2021-08-30 DIAGNOSIS — M7989 Other specified soft tissue disorders: Secondary | ICD-10-CM

## 2021-08-30 DIAGNOSIS — J439 Emphysema, unspecified: Secondary | ICD-10-CM

## 2021-08-30 DIAGNOSIS — R0789 Other chest pain: Secondary | ICD-10-CM | POA: Diagnosis not present

## 2021-08-30 DIAGNOSIS — I2510 Atherosclerotic heart disease of native coronary artery without angina pectoris: Secondary | ICD-10-CM

## 2021-08-30 DIAGNOSIS — R197 Diarrhea, unspecified: Secondary | ICD-10-CM | POA: Insufficient documentation

## 2021-08-30 DIAGNOSIS — R0609 Other forms of dyspnea: Secondary | ICD-10-CM

## 2021-08-30 DIAGNOSIS — K219 Gastro-esophageal reflux disease without esophagitis: Secondary | ICD-10-CM | POA: Insufficient documentation

## 2021-08-30 LAB — COMPLETE METABOLIC PANEL WITH GFR
AG Ratio: 1.9 (calc) (ref 1.0–2.5)
ALT: 33 U/L — ABNORMAL HIGH (ref 6–29)
AST: 31 U/L (ref 10–35)
Albumin: 4.3 g/dL (ref 3.6–5.1)
Alkaline phosphatase (APISO): 55 U/L (ref 37–153)
BUN: 16 mg/dL (ref 7–25)
CO2: 32 mmol/L (ref 20–32)
Calcium: 10.1 mg/dL (ref 8.6–10.4)
Chloride: 103 mmol/L (ref 98–110)
Creat: 0.82 mg/dL (ref 0.60–1.00)
Globulin: 2.3 g/dL (calc) (ref 1.9–3.7)
Glucose, Bld: 139 mg/dL — ABNORMAL HIGH (ref 65–99)
Potassium: 5.2 mmol/L (ref 3.5–5.3)
Sodium: 143 mmol/L (ref 135–146)
Total Bilirubin: 0.3 mg/dL (ref 0.2–1.2)
Total Protein: 6.6 g/dL (ref 6.1–8.1)
eGFR: 73 mL/min/{1.73_m2} (ref >=60)

## 2021-08-30 LAB — CBC WITH DIFFERENTIAL/PLATELET
Absolute Monocytes: 901 cells/uL (ref 200–950)
Basophils Absolute: 51 cells/uL (ref 0–200)
Basophils Relative: 0.6 %
Eosinophils Absolute: 153 cells/uL (ref 15–500)
Eosinophils Relative: 1.8 %
HCT: 40.1 % (ref 35.0–45.0)
Hemoglobin: 13.7 g/dL (ref 11.7–15.5)
Lymphs Abs: 2023 cells/uL (ref 850–3900)
MCH: 34.2 pg — ABNORMAL HIGH (ref 27.0–33.0)
MCHC: 34.2 g/dL (ref 32.0–36.0)
MCV: 100 fL (ref 80.0–100.0)
MPV: 10.9 fL (ref 7.5–12.5)
Monocytes Relative: 10.6 %
Neutro Abs: 5372 cells/uL (ref 1500–7800)
Neutrophils Relative %: 63.2 %
Platelets: 286 10*3/uL (ref 140–400)
RBC: 4.01 10*6/uL (ref 3.80–5.10)
RDW: 12.5 % (ref 11.0–15.0)
Total Lymphocyte: 23.8 %
WBC: 8.5 10*3/uL (ref 3.8–10.8)

## 2021-08-30 NOTE — Progress Notes (Signed)
Subjective:    Patient ID: [name], female    DOB: October 18, 1940, 80 y.o.   MRN: [id number]  Since I last saw the patient in September, she has gained 11 pounds.  He has +1 edema in both legs up to the knee.  These are all new findings for the patient.  She states that 4 days ago she developed an atypical pain in the center of her chest.  She blames it on a "stricture".  She states that improved with some Alka-Seltzer and by taking small bites when she swallows.  She denies any chest pain with walking or activity although she states that she is not getting a lot of activity.  She seems labored today breathing while sitting.  She blames the 11 pound weight gain on the Rexulti that she is taking for depression which is possible however she seems to be retaining fluid.  Patient had a myocardial perfusion test in May of this year which was normal.  Results are dictated below: Nuclear stress EF: 63%. The left ventricular ejection fraction is normal (55-65%). This is a low risk study. There is no evidence of ischemia and no evidence of previous infarction. The study is normal. Past Medical History:  Diagnosis Date   ADD (attention deficit disorder)    Allergic rhinitis    Anemia    Anxiety    Arrhythmia    Arthritis    "hands, neck, right shoulder" (07/28/2015)   Aseptic necrosis ([place])    Asthma dxc'd 05/2015   Basal cell cancer    Bleeding stomach ulcer ~ 06/2005   COPD (chronic obstructive pulmonary disease) (HCC)    Depression    Diverticula of colon    Dyspnea    Esophagitis    Gastric ulcer with hemorrhage    GERD (gastroesophageal reflux disease)    Heart murmur    History of blood transfusion 06/2005   "related to pneumonia"   History of kidney stones    Hyperlipidemia    Hypertension    Hypothyroidism    Kidney stones    Lung cancer ([place])    Non small cell lung cancer, adenocarcinoma with BAC 09/2009, VATS converted to wedge resection of left upper lobe mass    Mitral valve prolapse    "no ORs" (07/28/2015)   Osteopenia    Pneumonia 1970's; 06/2005   Sleep apnea dx'd 06/2015   on oxygen at nite at 2L/[place]    Squamous cell skin cancer    Past Surgical History:  Procedure Laterality Date   ABDOMINAL HYSTERECTOMY  1988   COLONOSCOPY WITH PROPOFOL N/A 06/15/2017   Procedure: COLONOSCOPY WITH PROPOFOL;  Surgeon: Carol Ada, MD;  Location: [facility];  Service: Endoscopy;  Laterality: N/A;   HAMMER TOE SURGERY Right ~ 2010   SALPINGOOPHORECTOMY Left 1988   SKIN CANCER EXCISION     "I've had severl cut off RLE; left foreqrm, nose under nose"   THORACOTOMY Left 06/2005   THORACOTOMY/LOBECTOMY  09/2009; 11/2009   left; right   Current Outpatient Medications on File Prior to Visit  Medication Sig Dispense Refill   acetaminophen (TYLENOL) 325 MG tablet Take by mouth.     albuterol (VENTOLIN HFA) 108 (90 Base) MCG/ACT inhaler Inhale 1 puff into the lungs every 6 (six) hours as needed for wheezing or shortness of breath.     ALPRAZolam (XANAX) 0.5 MG tablet Take 1 tablet (0.5 mg total) by mouth at bedtime as needed for sleep. 30 tablet 3  brexpiprazole (REXULTI) 1 MG TABS tablet Take 1 tablet (1 mg total) by mouth daily. 90 tablet 3   Calcium Carbonate-Vit D-Min (CALCIUM 1200 PO) Take 50 mg by mouth. ONCE A WEEK     CLINPRO 5000 1.1 % PSTE Place onto teeth.     co-enzyme Q-10 30 MG capsule Take 100 mg by mouth 3 (three) times daily.     Cyanocobalamin 1000 MCG CAPS Take by mouth.     doxazosin (CARDURA) 4 MG tablet TAKE 1 TABLET DAILY 90 tablet 3   DULoxetine (CYMBALTA) 60 MG capsule Take 1 capsule (60 mg total) by mouth daily. 90 capsule 3   fluticasone (FLONASE) 50 MCG/ACT nasal spray USE 2 SPRAYS IN EACH NOSTRIL DAILY 48 g 3   LEVOXYL 112 MCG tablet TAKE 1 TABLET DAILY (DISCONTINUE LEVOTHYROXINE 100) 90 tablet 3   losartan-hydrochlorothiazide (HYZAAR) 100-25 MG tablet TAKE 1 TABLET DAILY 90 tablet 3   Multiple Vitamins-Minerals (EMERGEN-C FIVE  PO) Take by mouth.     omeprazole (PRILOSEC) 20 MG capsule Take 1 capsule (20 mg total) by mouth daily. 90 capsule 3   OXYGEN 2lpm with sleep     rosuvastatin (CRESTOR) 20 MG tablet Take 1 tablet (20 mg total) by mouth daily. 90 tablet 3   thiamine (VITAMIN B-1) 100 MG tablet Take 100 mg by mouth daily.     TRELEGY ELLIPTA 100-62.5-25 MCG/INH AEPB USE 1 INHALATION DAILY (DISCONTINUE BEVESPI) 180 each 3   triamcinolone (KENALOG) 0.1 % Apply 1 application topically 2 (two) times daily. 453 g 0   No current facility-administered medications on file prior to visit.    Allergies  Allergen Reactions   Amoxicillin     Diarrhea, doesnt tolerate    Doxycycline Nausea Only   Erythromycin Nausea Only   Sulfonamide Derivatives Hives   Social History   Socioeconomic History   Marital status: Married    Spouse name: Not on file   Number of children: 3   Years of education: Not on file   Highest education level: Not on file  Occupational History   Occupation: retired Optician, dispensing: RETIRED  Tobacco Use   Smoking status: Former    Packs/day: 0.75    Years: 30.00    Pack years: 22.50    Types: Cigarettes    Quit date: 09/11/2004    Years since quitting: 16.9   Smokeless tobacco: Never  Vaping Use   Vaping Use: Never used  Substance and Sexual Activity   Alcohol use: Yes    Alcohol/week: 7.0 standard drinks    Types: 7 Shots of liquor per week    Comment: 1-2 drinks nightly    Drug use: No   Sexual activity: Yes  Other Topics Concern   Not on file  Social History Narrative   Not on file   Social Determinants of Health   Financial Resource Strain: Low Risk    Difficulty of Paying Living Expenses: Not hard at all  Food Insecurity: No Food Insecurity   Worried About Charity fundraiser in the Last Year: Never true   [place] in the Last Year: Never true  Transportation Needs: No Transportation Needs   Lack of Transportation (Medical): No   Lack of Transportation  (Non-Medical): No  Physical Activity: Inactive   Days of Exercise per Week: 0 days   Minutes of Exercise per Session: 0 min  Stress: No Stress Concern Present   Feeling of Stress : Not at all  Social Connections: Moderately Isolated   Frequency of Communication with Friends and Family: Twice a week   Frequency of Social Gatherings with Friends and Family: More than three times a week   Attends Religious Services: Never   Marine scientist or Organizations: No   Attends Music therapist: Never   Marital Status: Married  Human resources officer Violence: Not At Risk   Fear of Current or Ex-Partner: No   Emotionally Abused: No   Physically Abused: No   Sexually Abused: No      Review of Systems  All other systems reviewed and are negative.     Objective:   Physical Exam Vitals reviewed.  Constitutional:      General: She is not in acute distress.    Appearance: Normal appearance. She is normal weight. She is not ill-appearing or toxic-appearing.  Cardiovascular:     Rate and Rhythm: Normal rate and regular rhythm.     Heart sounds: Normal heart sounds.  Pulmonary:     Effort: Pulmonary effort is normal. Tachypnea present.     Breath sounds: Normal breath sounds. No wheezing, rhonchi or rales.  Abdominal:     General: Bowel sounds are normal.     Palpations: Abdomen is soft.  Musculoskeletal:     Right lower leg: Edema present.     Left lower leg: Edema present.  Neurological:     Mental Status: She is alert.          Assessment & Plan:  Atypical chest pain - Plan: EKG 12-Lead  Pulmonary emphysema, unspecified emphysema type (HCC)  Dyspnea on exertion  Leg swelling Rexulti can certainly cause weight gain and 10% of patients.  However there is no listing edema and its adverse reaction.  She definitely has edema today on exam.  She does have shortness of breath with activity and increased work of breathing however this is near her baseline due to her  COPD/emphysema.  She had a myocardial perfusion study performed in May that showed normal ejection fraction with no evidence of ischemia.  Therefore, the question is whether the patient has right-sided heart failure due to COPD/pulmonary hypertension/apnea or whether the swelling is due to rexulti.    EKG today shows normal sinus rhythm there are Q waves in leads III and aVF.  These were present on her EKG in May.  There are T wave inversions in V1 and V2 which are also old findings.  There is no acute change.  Obtain BNP, CBC, CMP today.  If BNP is elevated, would consult cardiology regarding possible right-sided heart failure and would recommend starting Lasix and discontinuing hydrochlorothiazide.  If labs are normal, would recommend holding Rexulti.

## 2021-08-31 LAB — BRAIN NATRIURETIC PEPTIDE: Brain Natriuretic Peptide: 37 pg/mL (ref <100)

## 2021-09-01 ENCOUNTER — Other Ambulatory Visit: Payer: Self-pay

## 2021-09-01 MED ORDER — FUROSEMIDE 20 MG PO TABS: 20.0000 mg | ORAL_TABLET | Freq: Every day | ORAL | 0 refills | Status: DC

## 2021-09-09 ENCOUNTER — Encounter: Payer: Self-pay | Admitting: Family Medicine

## 2021-09-09 ENCOUNTER — Other Ambulatory Visit: Payer: Self-pay

## 2021-09-09 ENCOUNTER — Ambulatory Visit (INDEPENDENT_AMBULATORY_CARE_PROVIDER_SITE_OTHER): Payer: Medicare Other | Admitting: Family Medicine

## 2021-09-09 VITALS — BP 140/88 | HR 88 | Temp 98.4°F | Resp 16 | Ht 66.0 in | Wt 209.4 lb

## 2021-09-09 DIAGNOSIS — R601 Generalized edema: Secondary | ICD-10-CM | POA: Diagnosis not present

## 2021-09-09 DIAGNOSIS — I2510 Atherosclerotic heart disease of native coronary artery without angina pectoris: Secondary | ICD-10-CM | POA: Diagnosis not present

## 2021-09-09 MED ORDER — TORSEMIDE 40 MG PO TABS: 40.0000 mg | ORAL_TABLET | Freq: Two times a day (BID) | ORAL | 0 refills | Status: DC

## 2021-09-09 NOTE — Progress Notes (Signed)
Subjective:    Patient ID: [name], female    DOB: 08-23-1941, 80 y.o.   MRN: [id number]  08/30/21 Since I last saw the patient in September, she has gained 11 pounds.  He has +1 edema in both legs up to the knee.  These are all new findings for the patient.  She states that 4 days ago she developed an atypical pain in the center of her chest.  She blames it on a "stricture".  She states that improved with some Alka-Seltzer and by taking small bites when she swallows.  She denies any chest pain with walking or activity although she states that she is not getting a lot of activity.  She seems labored today breathing while sitting.  She blames the 11 pound weight gain on the Rexulti that she is taking for depression which is possible however she seems to be retaining fluid.  Patient had a myocardial perfusion test in May of this year which was normal.  Results are dictated below: Nuclear stress EF: 63%. The left ventricular ejection fraction is normal (55-65%). This is a low risk study. There is no evidence of ischemia and no evidence of previous infarction. The study is normal.  At that time, my plan was: Rexulti can certainly cause weight gain and 10% of patients.  However there is no listing edema and its adverse reaction.  She definitely has edema today on exam.  She does have shortness of breath with activity and increased work of breathing however this is near her baseline due to her COPD/emphysema.  She had a myocardial perfusion study performed in May that showed normal ejection fraction with no evidence of ischemia.  Therefore, the question is whether the patient has right-sided heart failure due to COPD/pulmonary hypertension/apnea or whether the swelling is due to rexulti.    EKG today shows normal sinus rhythm there are Q waves in leads III and aVF.  These were present on her EKG in May.  There are T wave inversions in V1 and V2 which are also old findings.  There is no  acute change.  Obtain BNP, CBC, CMP today.  If BNP is elevated, would consult cardiology regarding possible right-sided heart failure and would recommend starting Lasix and discontinuing hydrochlorothiazide.  If labs are normal, would recommend holding Rexulti.  09/09/32 Labs were unremarkable.  BNP was 37.  Patient was started on Lasix 20 mg a day and Rexulti was held.  Patient gained 5 pounds in the last 10 days despite reducing her salt intake and limiting her fluid intake.  She even increased her Lasix to 40 mg a day with no improvement.  Today she is taking to 40 mg Lasix independently on her iron with no improvement.  She now has +2 edema in both legs up to her knees and her abdomen appears distended. Past Medical History:  Diagnosis Date   ADD (attention deficit disorder)    Allergic rhinitis    Anemia    Anxiety    Arrhythmia    Arthritis    "hands, neck, right shoulder" (07/28/2015)   Aseptic necrosis ([place])    Asthma dxc'd 05/2015   Basal cell cancer    Bleeding stomach ulcer ~ 06/2005   COPD (chronic obstructive pulmonary disease) (HCC)    Depression    Diverticula of colon    Dyspnea    Esophagitis    Gastric ulcer with hemorrhage    GERD (gastroesophageal reflux disease)    Heart murmur  History of blood transfusion 06/2005   "related to pneumonia"   History of kidney stones    Hyperlipidemia    Hypertension    Hypothyroidism    Kidney stones    Lung cancer ([place])    Non small cell lung cancer, adenocarcinoma with BAC 09/2009, VATS converted to wedge resection of left upper lobe mass   Mitral valve prolapse    "no ORs" (07/28/2015)   Osteopenia    Pneumonia 1970's; 06/2005   Sleep apnea dx'd 06/2015   on oxygen at nite at 2L/[place]    Squamous cell skin cancer    Past Surgical History:  Procedure Laterality Date   ABDOMINAL HYSTERECTOMY  1988   COLONOSCOPY WITH PROPOFOL N/A 06/15/2017   Procedure: COLONOSCOPY WITH PROPOFOL;  Surgeon: Carol Ada, MD;  Location:  [facility];  Service: Endoscopy;  Laterality: N/A;   HAMMER TOE SURGERY Right ~ 2010   SALPINGOOPHORECTOMY Left 1988   SKIN CANCER EXCISION     "I've had severl cut off RLE; left foreqrm, nose under nose"   THORACOTOMY Left 06/2005   THORACOTOMY/LOBECTOMY  09/2009; 11/2009   left; right   Current Outpatient Medications on File Prior to Visit  Medication Sig Dispense Refill   acetaminophen (TYLENOL) 325 MG tablet Take by mouth.     albuterol (VENTOLIN HFA) 108 (90 Base) MCG/ACT inhaler Inhale 1 puff into the lungs every 6 (six) hours as needed for wheezing or shortness of breath.     ALPRAZolam (XANAX) 0.5 MG tablet Take 1 tablet (0.5 mg total) by mouth at bedtime as needed for sleep. 30 tablet 3   Calcium Carbonate-Vit D-Min (CALCIUM 1200 PO) Take 50 mg by mouth. ONCE A WEEK     CLINPRO 5000 1.1 % PSTE Place onto teeth.     co-enzyme Q-10 30 MG capsule Take 100 mg by mouth 3 (three) times daily.     Cyanocobalamin 1000 MCG CAPS Take by mouth.     doxazosin (CARDURA) 4 MG tablet TAKE 1 TABLET DAILY 90 tablet 3   DULoxetine (CYMBALTA) 60 MG capsule Take 1 capsule (60 mg total) by mouth daily. 90 capsule 3   fluticasone (FLONASE) 50 MCG/ACT nasal spray USE 2 SPRAYS IN EACH NOSTRIL DAILY 48 g 3   LEVOXYL 112 MCG tablet TAKE 1 TABLET DAILY (DISCONTINUE LEVOTHYROXINE 100) 90 tablet 3   losartan-hydrochlorothiazide (HYZAAR) 100-25 MG tablet TAKE 1 TABLET DAILY 90 tablet 3   Multiple Vitamins-Minerals (EMERGEN-C FIVE PO) Take by mouth.     omeprazole (PRILOSEC) 20 MG capsule Take 1 capsule (20 mg total) by mouth daily. 90 capsule 3   OXYGEN 2lpm with sleep     rosuvastatin (CRESTOR) 20 MG tablet Take 1 tablet (20 mg total) by mouth daily. 90 tablet 3   thiamine (VITAMIN B-1) 100 MG tablet Take 100 mg by mouth daily.     TRELEGY ELLIPTA 100-62.5-25 MCG/INH AEPB USE 1 INHALATION DAILY (DISCONTINUE BEVESPI) 180 each 3   triamcinolone (KENALOG) 0.1 % Apply 1 application topically 2 (two) times  daily. 453 g 0   brexpiprazole (REXULTI) 1 MG TABS tablet Take 1 tablet (1 mg total) by mouth daily. (Patient not taking: Reported on 09/09/2021) 90 tablet 3   furosemide (LASIX) 20 MG tablet Take 1 tablet (20 mg total) by mouth daily for 3 days. 3 tablet 0   No current facility-administered medications on file prior to visit.    Allergies  Allergen Reactions   Amoxicillin     Diarrhea, doesnt tolerate  Doxycycline Nausea Only   Erythromycin Nausea Only   Sulfonamide Derivatives Hives   Social History   Socioeconomic History   Marital status: Married    Spouse name: Not on file   Number of children: 3   Years of education: Not on file   Highest education level: Not on file  Occupational History   Occupation: retired Optician, dispensing: RETIRED  Tobacco Use   Smoking status: Former    Packs/day: 0.75    Years: 30.00    Pack years: 22.50    Types: Cigarettes    Quit date: 09/11/2004    Years since quitting: 17.0   Smokeless tobacco: Never  Vaping Use   Vaping Use: Never used  Substance and Sexual Activity   Alcohol use: Yes    Alcohol/week: 7.0 standard drinks    Types: 7 Shots of liquor per week    Comment: 1-2 drinks nightly    Drug use: No   Sexual activity: Yes  Other Topics Concern   Not on file  Social History Narrative   Not on file   Social Determinants of Health   Financial Resource Strain: Low Risk    Difficulty of Paying Living Expenses: Not hard at all  Food Insecurity: No Food Insecurity   Worried About Charity fundraiser in the Last Year: Never true   [place] in the Last Year: Never true  Transportation Needs: No Transportation Needs   Lack of Transportation (Medical): No   Lack of Transportation (Non-Medical): No  Physical Activity: Inactive   Days of Exercise per Week: 0 days   Minutes of Exercise per Session: 0 min  Stress: No Stress Concern Present   Feeling of Stress : Not at all  Social Connections: Moderately Isolated    Frequency of Communication with Friends and Family: Twice a week   Frequency of Social Gatherings with Friends and Family: More than three times a week   Attends Religious Services: Never   Marine scientist or Organizations: No   Attends Music therapist: Never   Marital Status: Married  Human resources officer Violence: Not At Risk   Fear of Current or Ex-Partner: No   Emotionally Abused: No   Physically Abused: No   Sexually Abused: No      Review of Systems  All other systems reviewed and are negative.     Objective:   Physical Exam Vitals reviewed.  Constitutional:      General: She is not in acute distress.    Appearance: Normal appearance. She is normal weight. She is not ill-appearing or toxic-appearing.  Cardiovascular:     Rate and Rhythm: Normal rate and regular rhythm.     Heart sounds: Normal heart sounds.  Pulmonary:     Effort: Pulmonary effort is normal. Tachypnea present.     Breath sounds: Normal breath sounds. No wheezing, rhonchi or rales.  Abdominal:     General: Bowel sounds are normal.     Palpations: Abdomen is soft.  Musculoskeletal:     Right lower leg: Edema present.     Left lower leg: Edema present.  Neurological:     Mental Status: She is alert.          Assessment & Plan:  Anasarca - Plan: Urinalysis, Routine w reflex microscopic, Cortisol Patient seems to be retaining more fluid.  I do not feel that this is congestive heart failure based on a normal BNP.  AST and ALT  were normal and is no evidence of cirrhosis or kidney failure.  I will check a urinalysis to rule out nephrotic syndrome along with cortisol level to evaluate for adrenal insufficiency.  Discontinue Lasix and switch to torsemide 40 mg twice daily and then reassess the first of next week.

## 2021-09-09 NOTE — Telephone Encounter (Signed)
Spoke with patient and scheduled appointment for evaluation.

## 2021-09-10 LAB — URINALYSIS, ROUTINE W REFLEX MICROSCOPIC
Bilirubin Urine: NEGATIVE
Glucose, UA: NEGATIVE
Hgb urine dipstick: NEGATIVE
Ketones, ur: NEGATIVE
Leukocytes,Ua: NEGATIVE
Nitrite: NEGATIVE
Protein, ur: NEGATIVE
Specific Gravity, Urine: 1.006 (ref 1.001–1.035)
pH: 7 (ref 5.0–8.0)

## 2021-09-10 LAB — CORTISOL: Cortisol, Plasma: 21 ug/dL

## 2021-09-13 ENCOUNTER — Telehealth: Payer: Self-pay

## 2021-09-13 NOTE — Telephone Encounter (Signed)
Called pt - noted in results

## 2021-09-13 NOTE — Telephone Encounter (Signed)
Pt returned call to office about lab results would like for nurse to give a call back please. Please advise.  Cb#: [phone]

## 2021-09-22 ENCOUNTER — Encounter: Payer: Self-pay | Admitting: Family Medicine

## 2021-09-29 ENCOUNTER — Ambulatory Visit (INDEPENDENT_AMBULATORY_CARE_PROVIDER_SITE_OTHER): Payer: Medicare Other | Admitting: Family Medicine

## 2021-09-29 ENCOUNTER — Encounter: Payer: Self-pay | Admitting: Family Medicine

## 2021-09-29 ENCOUNTER — Other Ambulatory Visit: Payer: Self-pay

## 2021-09-29 VITALS — BP 132/68 | HR 94 | Temp 97.7°F | Resp 19 | Ht 66.0 in | Wt 200.0 lb

## 2021-09-29 DIAGNOSIS — M5136 Other intervertebral disc degeneration, lumbar region: Secondary | ICD-10-CM

## 2021-09-29 DIAGNOSIS — R601 Generalized edema: Secondary | ICD-10-CM | POA: Diagnosis not present

## 2021-09-29 LAB — BASIC METABOLIC PANEL WITH GFR
BUN/Creatinine Ratio: 24 (calc) — ABNORMAL HIGH (ref 6–22)
BUN: 36 mg/dL — ABNORMAL HIGH (ref 7–25)
CO2: 37 mmol/L — ABNORMAL HIGH (ref 20–32)
Calcium: 9.6 mg/dL (ref 8.6–10.4)
Chloride: 90 mmol/L — ABNORMAL LOW (ref 98–110)
Creat: 1.48 mg/dL — ABNORMAL HIGH (ref 0.60–1.00)
Glucose, Bld: 109 mg/dL — ABNORMAL HIGH (ref 65–99)
Potassium: 3.2 mmol/L — ABNORMAL LOW (ref 3.5–5.3)
Sodium: 138 mmol/L (ref 135–146)
eGFR: 36 mL/min/{1.73_m2} — ABNORMAL LOW (ref >=60)

## 2021-09-29 MED ORDER — NYSTATIN 100000 UNIT/ML MT SUSP: 5.0000 mL | Freq: Four times a day (QID) | OROMUCOSAL | 0 refills | Status: DC

## 2021-09-29 MED ORDER — GABAPENTIN 100 MG PO CAPS: 100.0000 mg | ORAL_CAPSULE | Freq: Three times a day (TID) | ORAL | 3 refills | Status: DC

## 2021-09-29 NOTE — Progress Notes (Signed)
Subjective:    Patient ID: [name], female    DOB: December 19, 1940, 81 y.o.   MRN: [id number]  08/30/21 Since I last saw the patient in September, she has gained 11 pounds.  He has +1 edema in both legs up to the knee.  These are all new findings for the patient.  She states that 4 days ago she developed an atypical pain in the center of her chest.  She blames it on a "stricture".  She states that improved with some Alka-Seltzer and by taking small bites when she swallows.  She denies any chest pain with walking or activity although she states that she is not getting a lot of activity.  She seems labored today breathing while sitting.  She blames the 11 pound weight gain on the Rexulti that she is taking for depression which is possible however she seems to be retaining fluid.  Patient had a myocardial perfusion test in May of this year which was normal.  Results are dictated below: Nuclear stress EF: 63%. The left ventricular ejection fraction is normal (55-65%). This is a low risk study. There is no evidence of ischemia and no evidence of previous infarction. The study is normal.  At that time, my plan was: Rexulti can certainly cause weight gain and 10% of patients.  However there is no listing edema and its adverse reaction.  She definitely has edema today on exam.  She does have shortness of breath with activity and increased work of breathing however this is near her baseline due to her COPD/emphysema.  She had a myocardial perfusion study performed in May that showed normal ejection fraction with no evidence of ischemia.  Therefore, the question is whether the patient has right-sided heart failure due to COPD/pulmonary hypertension/apnea or whether the swelling is due to rexulti.    EKG today shows normal sinus rhythm there are Q waves in leads III and aVF.  These were present on her EKG in May.  There are T wave inversions in V1 and V2 which are also old findings.  There is no  acute change.  Obtain BNP, CBC, CMP today.  If BNP is elevated, would consult cardiology regarding possible right-sided heart failure and would recommend starting Lasix and discontinuing hydrochlorothiazide.  If labs are normal, would recommend holding Rexulti.  09/09/32 Labs were unremarkable.  BNP was 37.  Patient was started on Lasix 20 mg a day and Rexulti was held.  Patient gained 5 pounds in the last 10 days despite reducing her salt intake and limiting her fluid intake.  She even increased her Lasix to 40 mg a day with no improvement.  Today she is taking to 40 mg Lasix independently on her iron with no improvement.  She now has +2 edema in both legs up to her knees and her abdomen appears distended.  At that time, my plan was:  Patient seems to be retaining more fluid.  I do not feel that this is congestive heart failure based on a normal BNP.  AST and ALT were normal and is no evidence of cirrhosis or kidney failure.  I will check a urinalysis to rule out nephrotic syndrome along with cortisol level to evaluate for adrenal insufficiency.  Discontinue Lasix and switch to torsemide 40 mg twice daily and then reassess the first of next week.  09/29/21 Wt Readings from Last 3 Encounters:  09/29/21 200 lb (90.7 kg)  09/09/21 209 lb 6.4 oz (95 kg)  08/30/21 204  lb (92.5 kg)   Patient has lost 9 pounds since I last saw her.  Originally she was taking for torsemide tablets daily.  We then reduced the dose to 2 tablets daily.  She is maintaining her weight between 200-201 pounds on 40 mg of torsemide.  She does report dry mouth and burning tongue.  She thinks that she has thrush.  On examination today I do not see any white plaque on the tongue however her tongue does look erythematous and swollen.  Mucous membranes are moist.  She does not appear dehydrated.  Thankfully there is no pitting edema in her legs.  She seems to responded well to the torsemide.  Now we need to see how her kidneys and potassium  are tolerating 40 mg of torsemide to try to find her maintenance dose.  She does complain of severe back pain.  An x-ray in 2019 showed severe degenerative disc disease at L1-L2, L2-L3, L3-L4, and L5-S1.  She has been taking Tylenol with no relief.  In the past she has tried gabapentin and seen benefit from the  Past Medical History:  Diagnosis Date   ADD (attention deficit disorder)    Allergic rhinitis    Anemia    Anxiety    Arrhythmia    Arthritis    "hands, neck, right shoulder" (07/28/2015)   Aseptic necrosis ([place])    Asthma dxc'd 05/2015   Basal cell cancer    Bleeding stomach ulcer ~ 06/2005   COPD (chronic obstructive pulmonary disease) (HCC)    Depression    Diverticula of colon    Dyspnea    Esophagitis    Gastric ulcer with hemorrhage    GERD (gastroesophageal reflux disease)    Heart murmur    History of blood transfusion 06/2005   "related to pneumonia"   History of kidney stones    Hyperlipidemia    Hypertension    Hypothyroidism    Kidney stones    Lung cancer ([place])    Non small cell lung cancer, adenocarcinoma with BAC 09/2009, VATS converted to wedge resection of left upper lobe mass   Mitral valve prolapse    "no ORs" (07/28/2015)   Osteopenia    Pneumonia 1970's; 06/2005   Sleep apnea dx'd 06/2015   on oxygen at nite at 2L/[place]    Squamous cell skin cancer    Past Surgical History:  Procedure Laterality Date   ABDOMINAL HYSTERECTOMY  1988   COLONOSCOPY WITH PROPOFOL N/A 06/15/2017   Procedure: COLONOSCOPY WITH PROPOFOL;  Surgeon: Carol Ada, MD;  Location: [facility];  Service: Endoscopy;  Laterality: N/A;   HAMMER TOE SURGERY Right ~ 2010   SALPINGOOPHORECTOMY Left 1988   SKIN CANCER EXCISION     "I've had severl cut off RLE; left foreqrm, nose under nose"   THORACOTOMY Left 06/2005   THORACOTOMY/LOBECTOMY  09/2009; 11/2009   left; right   Current Outpatient Medications on File Prior to Visit  Medication Sig Dispense Refill   acetaminophen  (TYLENOL) 325 MG tablet Take by mouth.     albuterol (VENTOLIN HFA) 108 (90 Base) MCG/ACT inhaler Inhale 1 puff into the lungs every 6 (six) hours as needed for wheezing or shortness of breath.     ALPRAZolam (XANAX) 0.5 MG tablet Take 1 tablet (0.5 mg total) by mouth at bedtime as needed for sleep. 30 tablet 3   Calcium Carbonate-Vit D-Min (CALCIUM 1200 PO) Take 50 mg by mouth. ONCE A WEEK     CLINPRO 5000 1.1 %  PSTE Place onto teeth.     co-enzyme Q-10 30 MG capsule Take 100 mg by mouth 3 (three) times daily.     Cyanocobalamin 1000 MCG CAPS Take by mouth.     doxazosin (CARDURA) 4 MG tablet TAKE 1 TABLET DAILY 90 tablet 3   DULoxetine (CYMBALTA) 60 MG capsule Take 1 capsule (60 mg total) by mouth daily. 90 capsule 3   fluticasone (FLONASE) 50 MCG/ACT nasal spray USE 2 SPRAYS IN EACH NOSTRIL DAILY 48 g 3   LEVOXYL 112 MCG tablet TAKE 1 TABLET DAILY (DISCONTINUE LEVOTHYROXINE 100) 90 tablet 3   losartan-hydrochlorothiazide (HYZAAR) 100-25 MG tablet TAKE 1 TABLET DAILY 90 tablet 3   Multiple Vitamins-Minerals (EMERGEN-C FIVE PO) Take by mouth.     omeprazole (PRILOSEC) 20 MG capsule Take 1 capsule (20 mg total) by mouth daily. 90 capsule 3   OXYGEN 2lpm with sleep     rosuvastatin (CRESTOR) 20 MG tablet Take 1 tablet (20 mg total) by mouth daily. 90 tablet 3   thiamine (VITAMIN B-1) 100 MG tablet Take 100 mg by mouth daily.     torsemide (DEMADEX) 20 MG tablet Take 4 tablets by mouth daily.     TRELEGY ELLIPTA 100-62.5-25 MCG/INH AEPB USE 1 INHALATION DAILY (DISCONTINUE BEVESPI) 180 each 3   triamcinolone (KENALOG) 0.1 % Apply 1 application topically 2 (two) times daily. 453 g 0   furosemide (LASIX) 20 MG tablet Take 1 tablet (20 mg total) by mouth daily for 3 days. 3 tablet 0   No current facility-administered medications on file prior to visit.    Allergies  Allergen Reactions   Amoxicillin     Diarrhea, doesnt tolerate    Doxycycline Nausea Only   Erythromycin Nausea Only    Sulfonamide Derivatives Hives   Social History   Socioeconomic History   Marital status: Married    Spouse name: Not on file   Number of children: 3   Years of education: Not on file   Highest education level: Not on file  Occupational History   Occupation: retired Optician, dispensing: RETIRED  Tobacco Use   Smoking status: Former    Packs/day: 0.75    Years: 30.00    Pack years: 22.50    Types: Cigarettes    Quit date: 09/11/2004    Years since quitting: 17.0   Smokeless tobacco: Never  Vaping Use   Vaping Use: Never used  Substance and Sexual Activity   Alcohol use: Yes    Alcohol/week: 7.0 standard drinks    Types: 7 Shots of liquor per week    Comment: 1-2 drinks nightly    Drug use: No   Sexual activity: Yes  Other Topics Concern   Not on file  Social History Narrative   Not on file   Social Determinants of Health   Financial Resource Strain: Low Risk    Difficulty of Paying Living Expenses: Not hard at all  Food Insecurity: No Food Insecurity   Worried About Charity fundraiser in the Last Year: Never true   [place] in the Last Year: Never true  Transportation Needs: No Transportation Needs   Lack of Transportation (Medical): No   Lack of Transportation (Non-Medical): No  Physical Activity: Inactive   Days of Exercise per Week: 0 days   Minutes of Exercise per Session: 0 min  Stress: No Stress Concern Present   Feeling of Stress : Not at all  Social Connections: Moderately Isolated  Frequency of Communication with Friends and Family: Twice a week   Frequency of Social Gatherings with Friends and Family: More than three times a week   Attends Religious Services: Never   Marine scientist or Organizations: No   Attends Music therapist: Never   Marital Status: Married  Human resources officer Violence: Not At Risk   Fear of Current or Ex-Partner: No   Emotionally Abused: No   Physically Abused: No   Sexually Abused: No      Review  of Systems  All other systems reviewed and are negative.     Objective:   Physical Exam Vitals reviewed.  Constitutional:      General: She is not in acute distress.    Appearance: Normal appearance. She is normal weight. She is not ill-appearing or toxic-appearing.  Cardiovascular:     Rate and Rhythm: Normal rate and regular rhythm.     Heart sounds: Normal heart sounds.  Pulmonary:     Effort: Pulmonary effort is normal.     Breath sounds: Normal breath sounds. No wheezing, rhonchi or rales.  Abdominal:     General: Bowel sounds are normal.     Palpations: Abdomen is soft.  Musculoskeletal:     Right lower leg: No edema.     Left lower leg: No edema.  Neurological:     Mental Status: She is alert.          Assessment & Plan:  Anasarca - Plan: BASIC METABOLIC PANEL WITH GFR  DDD (degenerative disc disease), lumbar Patient states that the breathing improved dramatically after the diuretic just within a few days.  The lower dose of torsemide has maintained her weight between 200-201.  Therefore if her creatinine and potassium are stable I plan to continue torsemide 40 mg a day.  If creatinine is elevated I will reduce the dose to 20 mg a day.  We will try gabapentin 100 mg p.o. 3 times daily for her severe lumbar degenerative disc disease.  Uptitrate gradually and slowly if necessary for pain control.

## 2021-10-01 ENCOUNTER — Other Ambulatory Visit: Payer: Self-pay | Admitting: Family Medicine

## 2021-10-06 ENCOUNTER — Encounter: Payer: Self-pay | Admitting: Family Medicine

## 2021-10-11 ENCOUNTER — Ambulatory Visit: Payer: Medicare Other | Admitting: Family Medicine

## 2021-10-13 ENCOUNTER — Ambulatory Visit (INDEPENDENT_AMBULATORY_CARE_PROVIDER_SITE_OTHER): Payer: Medicare Other | Admitting: Family Medicine

## 2021-10-13 ENCOUNTER — Encounter: Payer: Self-pay | Admitting: Family Medicine

## 2021-10-13 ENCOUNTER — Other Ambulatory Visit: Payer: Self-pay

## 2021-10-13 VITALS — BP 148/68 | HR 102 | Temp 96.7°F | Resp 20 | Ht 66.0 in | Wt 201.0 lb

## 2021-10-13 DIAGNOSIS — R7989 Other specified abnormal findings of blood chemistry: Secondary | ICD-10-CM | POA: Diagnosis not present

## 2021-10-13 DIAGNOSIS — N289 Disorder of kidney and ureter, unspecified: Secondary | ICD-10-CM

## 2021-10-13 DIAGNOSIS — R7301 Impaired fasting glucose: Secondary | ICD-10-CM | POA: Diagnosis not present

## 2021-10-13 NOTE — Progress Notes (Signed)
Subjective:    Patient ID: [name], female    DOB: 09-08-1941, 81 y.o.   MRN: [id number]  Patient presents today to discuss her renal function.  Her creatinine was normal in December.  However she had swelling in her legs and in her abdomen.  At that time we increased her torsemide and she diuresed substantially from 209 pounds down to 201 pounds.  However her creatinine rose substantially and creatinine increased from 0.82-1.48.  This was on January 19.  At that time I recommended that she hold torsemide as I feel that she had prerenal azotemia and that the hypokalemia was due to the torsemide and I was planning to recheck her labs in 1 week as I felt that we had to aggressively diurese the patient.  Apparently the patient never received a message and she is still taking torsemide.  Her daughter became concerned about the lab work prompting this visit.  They question if she needs to see a nephrologist  Past Medical History:  Diagnosis Date   ADD (attention deficit disorder)    Allergic rhinitis    Anemia    Anxiety    Arrhythmia    Arthritis    "hands, neck, right shoulder" (07/28/2015)   Aseptic necrosis ([place])    Asthma dxc'd 05/2015   Basal cell cancer    Bleeding stomach ulcer ~ 06/2005   COPD (chronic obstructive pulmonary disease) (HCC)    Depression    Diverticula of colon    Dyspnea    Esophagitis    Gastric ulcer with hemorrhage    GERD (gastroesophageal reflux disease)    Heart murmur    History of blood transfusion 06/2005   "related to pneumonia"   History of kidney stones    Hyperlipidemia    Hypertension    Hypothyroidism    Kidney stones    Lung cancer ([place])    Non small cell lung cancer, adenocarcinoma with BAC 09/2009, VATS converted to wedge resection of left upper lobe mass   Mitral valve prolapse    "no ORs" (07/28/2015)   Osteopenia    Pneumonia 1970's; 06/2005   Sleep apnea dx'd 06/2015   on oxygen at nite at 2L/[place]    Squamous cell skin  cancer    Past Surgical History:  Procedure Laterality Date   ABDOMINAL HYSTERECTOMY  1988   COLONOSCOPY WITH PROPOFOL N/A 06/15/2017   Procedure: COLONOSCOPY WITH PROPOFOL;  Surgeon: Carol Ada, MD;  Location: [facility];  Service: Endoscopy;  Laterality: N/A;   HAMMER TOE SURGERY Right ~ 2010   SALPINGOOPHORECTOMY Left 1988   SKIN CANCER EXCISION     "I've had severl cut off RLE; left foreqrm, nose under nose"   THORACOTOMY Left 06/2005   THORACOTOMY/LOBECTOMY  09/2009; 11/2009   left; right   Current Outpatient Medications on File Prior to Visit  Medication Sig Dispense Refill   acetaminophen (TYLENOL) 325 MG tablet Take by mouth.     albuterol (VENTOLIN HFA) 108 (90 Base) MCG/ACT inhaler Inhale 1 puff into the lungs every 6 (six) hours as needed for wheezing or shortness of breath.     ALPRAZolam (XANAX) 0.5 MG tablet Take 1 tablet (0.5 mg total) by mouth at bedtime as needed for sleep. 30 tablet 3   Calcium Carbonate-Vit D-Min (CALCIUM 1200 PO) Take 50 mg by mouth. ONCE A WEEK     CLINPRO 5000 1.1 % PSTE Place onto teeth.     co-enzyme Q-10 30 MG capsule Take 100  mg by mouth 3 (three) times daily.     Cyanocobalamin 1000 MCG CAPS Take by mouth.     doxazosin (CARDURA) 4 MG tablet TAKE 1 TABLET DAILY 90 tablet 3   DULoxetine (CYMBALTA) 60 MG capsule Take 1 capsule (60 mg total) by mouth daily. 90 capsule 3   fluticasone (FLONASE) 50 MCG/ACT nasal spray USE 2 SPRAYS IN EACH NOSTRIL DAILY 48 g 3   gabapentin (NEURONTIN) 100 MG capsule Take 1 capsule (100 mg total) by mouth 3 (three) times daily. 90 capsule 3   LEVOXYL 112 MCG tablet TAKE 1 TABLET DAILY (DISCONTINUE LEVOTHYROXINE 100) 90 tablet 3   losartan-hydrochlorothiazide (HYZAAR) 100-25 MG tablet TAKE 1 TABLET DAILY 90 tablet 3   Multiple Vitamins-Minerals (EMERGEN-C FIVE PO) Take by mouth.     nystatin (MYCOSTATIN) 100000 UNIT/ML suspension Take 5 mLs (500,000 Units total) by mouth 4 (four) times daily. 60 mL 0   omeprazole  (PRILOSEC) 20 MG capsule Take 1 capsule (20 mg total) by mouth daily. 90 capsule 3   OXYGEN 2lpm with sleep     rosuvastatin (CRESTOR) 20 MG tablet Take 1 tablet (20 mg total) by mouth daily. 90 tablet 3   thiamine (VITAMIN B-1) 100 MG tablet Take 100 mg by mouth daily.     torsemide (DEMADEX) 20 MG tablet TAKE 2 TABLETS (40MG ) BY MOUTH IN THE MORNING AND AT BEDTIME 120 tablet 1   TRELEGY ELLIPTA 100-62.5-25 MCG/INH AEPB USE 1 INHALATION DAILY (DISCONTINUE BEVESPI) 180 each 3   triamcinolone (KENALOG) 0.1 % Apply 1 application topically 2 (two) times daily. 453 g 0   No current facility-administered medications on file prior to visit.    Allergies  Allergen Reactions   Amoxicillin     Diarrhea, doesnt tolerate    Doxycycline Nausea Only   Erythromycin Nausea Only   Sulfonamide Derivatives Hives   Social History   Socioeconomic History   Marital status: Married    Spouse name: Not on file   Number of children: 3   Years of education: Not on file   Highest education level: Not on file  Occupational History   Occupation: retired Optician, dispensing: RETIRED  Tobacco Use   Smoking status: Former    Packs/day: 0.75    Years: 30.00    Pack years: 22.50    Types: Cigarettes    Quit date: 09/11/2004    Years since quitting: 17.0   Smokeless tobacco: Never  Vaping Use   Vaping Use: Never used  Substance and Sexual Activity   Alcohol use: Yes    Alcohol/week: 7.0 standard drinks    Types: 7 Shots of liquor per week    Comment: 1-2 drinks nightly    Drug use: No   Sexual activity: Yes  Other Topics Concern   Not on file  Social History Narrative   Not on file   Social Determinants of Health   Financial Resource Strain: Low Risk    Difficulty of Paying Living Expenses: Not hard at all  Food Insecurity: No Food Insecurity   Worried About Charity fundraiser in the Last Year: Never true   [place] in the Last Year: Never true  Transportation Needs: No Transportation  Needs   Lack of Transportation (Medical): No   Lack of Transportation (Non-Medical): No  Physical Activity: Inactive   Days of Exercise per Week: 0 days   Minutes of Exercise per Session: 0 min  Stress: No Stress Concern Present  Feeling of Stress : Not at all  Social Connections: Moderately Isolated   Frequency of Communication with Friends and Family: Twice a week   Frequency of Social Gatherings with Friends and Family: More than three times a week   Attends Religious Services: Never   Marine scientist or Organizations: No   Attends Music therapist: Never   Marital Status: Married  Human resources officer Violence: Not At Risk   Fear of Current or Ex-Partner: No   Emotionally Abused: No   Physically Abused: No   Sexually Abused: No      Review of Systems  All other systems reviewed and are negative.     Objective:   Physical Exam Vitals reviewed.  Constitutional:      General: She is not in acute distress.    Appearance: Normal appearance. She is normal weight. She is not ill-appearing or toxic-appearing.  Cardiovascular:     Rate and Rhythm: Normal rate and regular rhythm.     Heart sounds: Normal heart sounds.  Pulmonary:     Effort: Pulmonary effort is normal.     Breath sounds: Normal breath sounds. No wheezing, rhonchi or rales.  Abdominal:     General: Bowel sounds are normal.     Palpations: Abdomen is soft.  Musculoskeletal:     Right lower leg: No edema.     Left lower leg: No edema.  Neurological:     Mental Status: She is alert.          Assessment & Plan:  Acute renal insufficiency - Plan: CBC with Differential/Platelet, BASIC METABOLIC PANEL WITH GFR  Prerenal azotemia I believe her lab work reflects prerenal azotemia secondary to aggressive diuresis and dehydration.  Therefore I do not feel that she needs to see a nephrologist at the present time unless her renal function does not improve after we hold the torsemide.  I  explained this to the patient.  At this point she does not require further diuresis and she cannot continue to take torsemide.  I believe the hypokalemia was secondary to the torsemide and therefore holding the medication should correct the hypokalemia as well.  I will repeat a BMP today.  I will see the patient back in 1 week to ensure that she does not become fluid overloaded off the torsemide.  Regarding her shortness of breath, her lungs are clear today.  There is no wheezing and certainly no evidence of pulmonary edema.  At this point I would recommend follow-up with her pulmonologist.

## 2021-10-14 ENCOUNTER — Encounter: Payer: Self-pay | Admitting: Family Medicine

## 2021-10-17 ENCOUNTER — Other Ambulatory Visit: Payer: Self-pay | Admitting: Family Medicine

## 2021-10-17 MED ORDER — TEMAZEPAM 15 MG PO CAPS: 15.0000 mg | ORAL_CAPSULE | Freq: Every evening | ORAL | 0 refills | Status: DC | PRN

## 2021-10-18 LAB — TEST AUTHORIZATION

## 2021-10-18 LAB — CBC WITH DIFFERENTIAL/PLATELET
Absolute Monocytes: 525 cells/uL (ref 200–950)
Basophils Absolute: 53 cells/uL (ref 0–200)
Basophils Relative: 0.7 %
Eosinophils Absolute: 308 cells/uL (ref 15–500)
Eosinophils Relative: 4.1 %
HCT: 39.3 % (ref 35.0–45.0)
Hemoglobin: 13.5 g/dL (ref 11.7–15.5)
Lymphs Abs: 1785 cells/uL (ref 850–3900)
MCH: 34.3 pg — ABNORMAL HIGH (ref 27.0–33.0)
MCHC: 34.4 g/dL (ref 32.0–36.0)
MCV: 99.7 fL (ref 80.0–100.0)
MPV: 10.5 fL (ref 7.5–12.5)
Monocytes Relative: 7 %
Neutro Abs: 4830 cells/uL (ref 1500–7800)
Neutrophils Relative %: 64.4 %
Platelets: 292 10*3/uL (ref 140–400)
RBC: 3.94 10*6/uL (ref 3.80–5.10)
RDW: 12.7 % (ref 11.0–15.0)
Total Lymphocyte: 23.8 %
WBC: 7.5 10*3/uL (ref 3.8–10.8)

## 2021-10-18 LAB — BASIC METABOLIC PANEL WITH GFR
BUN: 19 mg/dL (ref 7–25)
CO2: 27 mmol/L (ref 20–32)
Calcium: 9.8 mg/dL (ref 8.6–10.4)
Chloride: 95 mmol/L — ABNORMAL LOW (ref 98–110)
Creat: 0.96 mg/dL (ref 0.60–1.00)
Glucose, Bld: 194 mg/dL — ABNORMAL HIGH (ref 65–99)
Potassium: 3.9 mmol/L (ref 3.5–5.3)
Sodium: 138 mmol/L (ref 135–146)
eGFR: 60 mL/min/{1.73_m2} (ref >=60)

## 2021-10-18 LAB — HEMOGLOBIN A1C W/OUT EAG: Hgb A1c MFr Bld: 5.9 % of total Hgb — ABNORMAL HIGH (ref <5.7)

## 2021-10-19 ENCOUNTER — Other Ambulatory Visit: Payer: Self-pay

## 2021-10-19 MED ORDER — TORSEMIDE 20 MG PO TABS: ORAL_TABLET | ORAL | 1 refills | Status: DC

## 2021-10-25 ENCOUNTER — Encounter: Payer: Self-pay | Admitting: Family Medicine

## 2021-10-28 ENCOUNTER — Encounter: Payer: Self-pay | Admitting: Family Medicine

## 2021-10-28 ENCOUNTER — Other Ambulatory Visit: Payer: Self-pay

## 2021-10-28 ENCOUNTER — Ambulatory Visit (INDEPENDENT_AMBULATORY_CARE_PROVIDER_SITE_OTHER): Payer: Medicare Other | Admitting: Family Medicine

## 2021-10-28 VITALS — BP 124/78 | HR 81 | Temp 97.3°F | Resp 18 | Ht 66.0 in | Wt 203.0 lb

## 2021-10-28 DIAGNOSIS — R0609 Other forms of dyspnea: Secondary | ICD-10-CM | POA: Diagnosis not present

## 2021-10-28 DIAGNOSIS — M7989 Other specified soft tissue disorders: Secondary | ICD-10-CM

## 2021-10-28 DIAGNOSIS — N289 Disorder of kidney and ureter, unspecified: Secondary | ICD-10-CM | POA: Diagnosis not present

## 2021-10-28 NOTE — Progress Notes (Signed)
Subjective:    Patient ID: [name], female    DOB: 10-Sep-1941, 81 y.o.   MRN: [id number] 10/13/21 Patient presents today to discuss her renal function.  Her creatinine was normal in December.  However she had swelling in her legs and in her abdomen.  At that time we increased her torsemide and she diuresed substantially from 209 pounds down to 201 pounds.  However her creatinine rose substantially and creatinine increased from 0.82-1.48.  This was on January 19.  At that time I recommended that she hold torsemide as I feel that she had prerenal azotemia and that the hypokalemia was due to the torsemide and I was planning to recheck her labs in 1 week as I felt that we had to aggressively diurese the patient.  Apparently the patient never received a message and she is still taking torsemide.  Her daughter became concerned about the lab work prompting this visit.  They question if she needs to see a nephrologist.  At that time, my plan was:  I believe her lab work reflects prerenal azotemia secondary to aggressive diuresis and dehydration.  Therefore I do not feel that she needs to see a nephrologist at the present time unless her renal function does not improve after we hold the torsemide.  I explained this to the patient.  At this point she does not require further diuresis and she cannot continue to take torsemide.  I believe the hypokalemia was secondary to the torsemide and therefore holding the medication should correct the hypokalemia as well.  I will repeat a BMP today.  I will see the patient back in 1 week to ensure that she does not become fluid overloaded off the torsemide.  Regarding her shortness of breath, her lungs are clear today.  There is no wheezing and certainly no evidence of pulmonary edema.  At this point I would recommend follow-up with her pulmonologist.  10/28/21 On most recent labs, Cr was 0.96.   Wt Readings from Last 3 Encounters:  10/28/21 203 lb (92.1 kg)   10/13/21 201 lb (91.2 kg)  09/29/21 200 lb (90.7 kg)   Despite holding torsemide, the patient's weight is only increased by 2 pounds.  She has trace bipedal edema today but is not substantial.  She denies any worsening shortness of breath.  Her lungs are clear to auscultation bilaterally and there is no evidence of pulmonary edema.  There is no evidence of anasarca.  Patient seems to be doing well off the torsemide and her blood pressure today is well controlled at 124/78.  The recent lab work did show prediabetes with an A1c of 5.9.  She has made an appointment with a pulmonologist.  Patient had a stress test in May of last year that revealed no ischemia and ejection fraction was greater than 60%.  Therefore I believe her dyspnea is most likely due to her underlying COPD coupled with deconditioning.  Past Medical History:  Diagnosis Date   ADD (attention deficit disorder)    Allergic rhinitis    Anemia    Anxiety    Arrhythmia    Arthritis    "hands, neck, right shoulder" (07/28/2015)   Aseptic necrosis ([place])    Asthma dxc'd 05/2015   Basal cell cancer    Bleeding stomach ulcer ~ 06/2005   COPD (chronic obstructive pulmonary disease) (HCC)    Depression    Diverticula of colon    Dyspnea    Esophagitis    Gastric ulcer  with hemorrhage    GERD (gastroesophageal reflux disease)    Heart murmur    History of blood transfusion 06/2005   "related to pneumonia"   History of kidney stones    Hyperlipidemia    Hypertension    Hypothyroidism    Kidney stones    Lung cancer ([place])    Non small cell lung cancer, adenocarcinoma with BAC 09/2009, VATS converted to wedge resection of left upper lobe mass   Mitral valve prolapse    "no ORs" (07/28/2015)   Osteopenia    Pneumonia 1970's; 06/2005   Sleep apnea dx'd 06/2015   on oxygen at nite at 2L/[place]    Squamous cell skin cancer    Past Surgical History:  Procedure Laterality Date   ABDOMINAL HYSTERECTOMY  1988   COLONOSCOPY WITH  PROPOFOL N/A 06/15/2017   Procedure: COLONOSCOPY WITH PROPOFOL;  Surgeon: Carol Ada, MD;  Location: [facility];  Service: Endoscopy;  Laterality: N/A;   HAMMER TOE SURGERY Right ~ 2010   SALPINGOOPHORECTOMY Left 1988   SKIN CANCER EXCISION     "I've had severl cut off RLE; left foreqrm, nose under nose"   THORACOTOMY Left 06/2005   THORACOTOMY/LOBECTOMY  09/2009; 11/2009   left; right   Current Outpatient Medications on File Prior to Visit  Medication Sig Dispense Refill   acetaminophen (TYLENOL) 325 MG tablet Take by mouth.     albuterol (VENTOLIN HFA) 108 (90 Base) MCG/ACT inhaler Inhale 1 puff into the lungs every 6 (six) hours as needed for wheezing or shortness of breath.     Calcium Carbonate-Vit D-Min (CALCIUM 1200 PO) Take 50 mg by mouth. ONCE A WEEK     CLINPRO 5000 1.1 % PSTE Place onto teeth.     co-enzyme Q-10 30 MG capsule Take 100 mg by mouth 3 (three) times daily.     Cyanocobalamin 1000 MCG CAPS Take by mouth.     doxazosin (CARDURA) 4 MG tablet TAKE 1 TABLET DAILY 90 tablet 3   DULoxetine (CYMBALTA) 60 MG capsule Take 1 capsule (60 mg total) by mouth daily. 90 capsule 3   fluticasone (FLONASE) 50 MCG/ACT nasal spray USE 2 SPRAYS IN EACH NOSTRIL DAILY 48 g 3   gabapentin (NEURONTIN) 100 MG capsule Take 1 capsule (100 mg total) by mouth 3 (three) times daily. 90 capsule 3   LEVOXYL 112 MCG tablet TAKE 1 TABLET DAILY (DISCONTINUE LEVOTHYROXINE 100) 90 tablet 3   losartan-hydrochlorothiazide (HYZAAR) 100-25 MG tablet TAKE 1 TABLET DAILY 90 tablet 3   Multiple Vitamins-Minerals (EMERGEN-C FIVE PO) Take by mouth.     nystatin (MYCOSTATIN) 100000 UNIT/ML suspension Take 5 mLs (500,000 Units total) by mouth 4 (four) times daily. 60 mL 0   omeprazole (PRILOSEC) 20 MG capsule Take 1 capsule (20 mg total) by mouth daily. 90 capsule 3   OXYGEN 2lpm with sleep     REXULTI 1 MG TABS tablet Take 1 mg by mouth daily.     rosuvastatin (CRESTOR) 20 MG tablet Take 1 tablet (20 mg total)  by mouth daily. 90 tablet 3   temazepam (RESTORIL) 15 MG capsule Take 1 capsule (15 mg total) by mouth at bedtime as needed for sleep. 30 capsule 0   thiamine (VITAMIN B-1) 100 MG tablet Take 100 mg by mouth daily.     TRELEGY ELLIPTA 100-62.5-25 MCG/INH AEPB USE 1 INHALATION DAILY (DISCONTINUE BEVESPI) 180 each 3   triamcinolone (KENALOG) 0.1 % Apply 1 application topically 2 (two) times daily. 453 g 0  torsemide (DEMADEX) 20 MG tablet TAKE 2 TABLETS (40MG ) BY MOUTH IN THE MORNING AND AT BEDTIME (Patient not taking: Reported on 10/28/2021) 360 tablet 1   No current facility-administered medications on file prior to visit.    Allergies  Allergen Reactions   Amoxicillin     Diarrhea, doesnt tolerate    Doxycycline Nausea Only   Erythromycin Nausea Only   Sulfonamide Derivatives Hives   Social History   Socioeconomic History   Marital status: Married    Spouse name: Not on file   Number of children: 3   Years of education: Not on file   Highest education level: Not on file  Occupational History   Occupation: retired Optician, dispensing: RETIRED  Tobacco Use   Smoking status: Former    Packs/day: 0.75    Years: 30.00    Pack years: 22.50    Types: Cigarettes    Quit date: 09/11/2004    Years since quitting: 17.1   Smokeless tobacco: Never  Vaping Use   Vaping Use: Never used  Substance and Sexual Activity   Alcohol use: Yes    Alcohol/week: 7.0 standard drinks    Types: 7 Shots of liquor per week    Comment: 1-2 drinks nightly    Drug use: No   Sexual activity: Yes  Other Topics Concern   Not on file  Social History Narrative   Not on file   Social Determinants of Health   Financial Resource Strain: Low Risk    Difficulty of Paying Living Expenses: Not hard at all  Food Insecurity: No Food Insecurity   Worried About Charity fundraiser in the Last Year: Never true   [place] in the Last Year: Never true  Transportation Needs: No Transportation Needs   Lack  of Transportation (Medical): No   Lack of Transportation (Non-Medical): No  Physical Activity: Inactive   Days of Exercise per Week: 0 days   Minutes of Exercise per Session: 0 min  Stress: No Stress Concern Present   Feeling of Stress : Not at all  Social Connections: Moderately Isolated   Frequency of Communication with Friends and Family: Twice a week   Frequency of Social Gatherings with Friends and Family: More than three times a week   Attends Religious Services: Never   Marine scientist or Organizations: No   Attends Music therapist: Never   Marital Status: Married  Human resources officer Violence: Not At Risk   Fear of Current or Ex-Partner: No   Emotionally Abused: No   Physically Abused: No   Sexually Abused: No      Review of Systems  All other systems reviewed and are negative.     Objective:   Physical Exam Vitals reviewed.  Constitutional:      General: She is not in acute distress.    Appearance: Normal appearance. She is normal weight. She is not ill-appearing or toxic-appearing.  Cardiovascular:     Rate and Rhythm: Normal rate and regular rhythm.     Heart sounds: Normal heart sounds.  Pulmonary:     Effort: Pulmonary effort is normal.     Breath sounds: Normal breath sounds. No wheezing, rhonchi or rales.  Abdominal:     General: Bowel sounds are normal.     Palpations: Abdomen is soft.  Musculoskeletal:     Right lower leg: No edema.     Left lower leg: No edema.  Neurological:  Mental Status: She is alert.          Assessment & Plan:  Leg swelling - Plan: BASIC METABOLIC PANEL WITH GFR  Dyspnea on exertion  Acute renal insufficiency Surprisingly, her acute renal insufficiency had resolved prior to drawing labs at her last appointment.  That being said we still held the torsemide anyway.  I will repeat her renal function today but I do feel that the rise in her creatinine was secondary to dehydration triggered by the  aggressive diuresis.  Despite holding the torsemide her weight is only up 2 pounds and there is no evidence of fluid overload.  Therefore I recommended that we use the torsemide sparingly as needed for leg swelling.  I believe she may have an element of cor pulmonale due to right-sided strain from her underlying COPD.  At times I feel that she may have leg swelling and fluid overload secondary to this but I would just treat that with as needed diuretics.  I believe her dyspnea on exertion is related to her COPD and I have encouraged her to follow-up with her pulmonologist.  She has made an appointment for March.  We discussed a low carbohydrate diet.

## 2021-10-29 LAB — BASIC METABOLIC PANEL WITH GFR
BUN: 14 mg/dL (ref 7–25)
CO2: 28 mmol/L (ref 20–32)
Calcium: 10.2 mg/dL (ref 8.6–10.4)
Chloride: 103 mmol/L (ref 98–110)
Creat: 0.76 mg/dL (ref 0.60–1.00)
Glucose, Bld: 92 mg/dL (ref 65–99)
Potassium: 4.5 mmol/L (ref 3.5–5.3)
Sodium: 140 mmol/L (ref 135–146)
eGFR: 80 mL/min/{1.73_m2} (ref >=60)

## 2021-11-07 ENCOUNTER — Inpatient Hospital Stay: Payer: Medicare Other | Attending: Internal Medicine

## 2021-11-07 ENCOUNTER — Encounter: Payer: Self-pay | Admitting: Family Medicine

## 2021-11-07 ENCOUNTER — Ambulatory Visit (HOSPITAL_COMMUNITY)
Admission: RE | Admit: 2021-11-07 | Discharge: 2021-11-07 | Disposition: A | Discharge status: Home / Self Care | Payer: Medicare Other | Source: Ambulatory Visit | Attending: Internal Medicine | Admitting: Internal Medicine

## 2021-11-07 ENCOUNTER — Other Ambulatory Visit: Payer: Self-pay

## 2021-11-07 ENCOUNTER — Encounter (HOSPITAL_COMMUNITY): Payer: Self-pay

## 2021-11-07 DIAGNOSIS — I10 Essential (primary) hypertension: Secondary | ICD-10-CM | POA: Diagnosis not present

## 2021-11-07 DIAGNOSIS — Z882 Allergy status to sulfonamides status: Secondary | ICD-10-CM | POA: Diagnosis not present

## 2021-11-07 DIAGNOSIS — Z87442 Personal history of urinary calculi: Secondary | ICD-10-CM | POA: Insufficient documentation

## 2021-11-07 DIAGNOSIS — E039 Hypothyroidism, unspecified: Secondary | ICD-10-CM | POA: Diagnosis not present

## 2021-11-07 DIAGNOSIS — I700 Atherosclerosis of aorta: Secondary | ICD-10-CM | POA: Diagnosis not present

## 2021-11-07 DIAGNOSIS — Z90721 Acquired absence of ovaries, unilateral: Secondary | ICD-10-CM | POA: Diagnosis not present

## 2021-11-07 DIAGNOSIS — F32A Depression, unspecified: Secondary | ICD-10-CM | POA: Diagnosis not present

## 2021-11-07 DIAGNOSIS — Z881 Allergy status to other antibiotic agents status: Secondary | ICD-10-CM | POA: Diagnosis not present

## 2021-11-07 DIAGNOSIS — C3412 Malignant neoplasm of upper lobe, left bronchus or lung: Secondary | ICD-10-CM | POA: Diagnosis not present

## 2021-11-07 DIAGNOSIS — Z880 Allergy status to penicillin: Secondary | ICD-10-CM | POA: Diagnosis not present

## 2021-11-07 DIAGNOSIS — M7989 Other specified soft tissue disorders: Secondary | ICD-10-CM | POA: Diagnosis not present

## 2021-11-07 DIAGNOSIS — C3490 Malignant neoplasm of unspecified part of unspecified bronchus or lung: Secondary | ICD-10-CM

## 2021-11-07 DIAGNOSIS — Z79899 Other long term (current) drug therapy: Secondary | ICD-10-CM | POA: Diagnosis not present

## 2021-11-07 DIAGNOSIS — R0602 Shortness of breath: Secondary | ICD-10-CM | POA: Insufficient documentation

## 2021-11-07 LAB — CBC WITH DIFFERENTIAL (CANCER CENTER ONLY)
Abs Immature Granulocytes: 0.04 10*3/uL (ref 0.00–0.07)
Basophils Absolute: 0 10*3/uL (ref 0.0–0.1)
Basophils Relative: 1 %
Eosinophils Absolute: 0.2 10*3/uL (ref 0.0–0.5)
Eosinophils Relative: 3 %
HCT: 37.7 % (ref 36.0–46.0)
Hemoglobin: 12.5 g/dL (ref 12.0–15.0)
Immature Granulocytes: 1 %
Lymphocytes Relative: 23 %
Lymphs Abs: 1.8 10*3/uL (ref 0.7–4.0)
MCH: 32.6 pg (ref 26.0–34.0)
MCHC: 33.2 g/dL (ref 30.0–36.0)
MCV: 98.4 fL (ref 80.0–100.0)
Monocytes Absolute: 0.8 10*3/uL (ref 0.1–1.0)
Monocytes Relative: 11 %
Neutro Abs: 4.7 10*3/uL (ref 1.7–7.7)
Neutrophils Relative %: 61 %
Platelet Count: 270 10*3/uL (ref 150–400)
RBC: 3.83 MIL/uL — ABNORMAL LOW (ref 3.87–5.11)
RDW: 13.4 % (ref 11.5–15.5)
WBC Count: 7.5 10*3/uL (ref 4.0–10.5)
nRBC: 0 % (ref 0.0–0.2)

## 2021-11-07 LAB — CMP (CANCER CENTER ONLY)
ALT: 24 U/L (ref 0–44)
AST: 23 U/L (ref 15–41)
Albumin: 4.2 g/dL (ref 3.5–5.0)
Alkaline Phosphatase: 50 U/L (ref 38–126)
Anion gap: 8 (ref 5–15)
BUN: 12 mg/dL (ref 8–23)
CO2: 28 mmol/L (ref 22–32)
Calcium: 9.2 mg/dL (ref 8.9–10.3)
Chloride: 103 mmol/L (ref 98–111)
Creatinine: 0.83 mg/dL (ref 0.44–1.00)
GFR, Estimated: >60 mL/min (ref >60)
Glucose, Bld: 107 mg/dL — ABNORMAL HIGH (ref 70–99)
Potassium: 3.5 mmol/L (ref 3.5–5.1)
Sodium: 139 mmol/L (ref 135–145)
Total Bilirubin: 0.4 mg/dL (ref 0.3–1.2)
Total Protein: 6.9 g/dL (ref 6.5–8.1)

## 2021-11-07 MED ORDER — IOHEXOL 300 MG/ML  SOLN
75.0000 mL | Freq: Once | INTRAMUSCULAR | Status: AC | PRN
  Administered 2021-11-07: 75 mL via INTRAVENOUS

## 2021-11-07 MED ORDER — SODIUM CHLORIDE (PF) 0.9 % IJ SOLN
INTRAMUSCULAR | Status: AC
  Filled 2021-11-07: qty 50

## 2021-11-08 ENCOUNTER — Inpatient Hospital Stay (HOSPITAL_BASED_OUTPATIENT_CLINIC_OR_DEPARTMENT_OTHER): Payer: Medicare Other | Admitting: Internal Medicine

## 2021-11-08 ENCOUNTER — Encounter: Payer: Self-pay | Admitting: Internal Medicine

## 2021-11-08 VITALS — BP 140/81 | HR 98 | Temp 99.7°F | Resp 20 | Ht 66.0 in | Wt 203.6 lb

## 2021-11-08 DIAGNOSIS — I10 Essential (primary) hypertension: Secondary | ICD-10-CM | POA: Diagnosis not present

## 2021-11-08 DIAGNOSIS — C3490 Malignant neoplasm of unspecified part of unspecified bronchus or lung: Secondary | ICD-10-CM | POA: Diagnosis not present

## 2021-11-08 DIAGNOSIS — E039 Hypothyroidism, unspecified: Secondary | ICD-10-CM | POA: Diagnosis not present

## 2021-11-08 DIAGNOSIS — C3412 Malignant neoplasm of upper lobe, left bronchus or lung: Secondary | ICD-10-CM | POA: Diagnosis not present

## 2021-11-08 DIAGNOSIS — F32A Depression, unspecified: Secondary | ICD-10-CM | POA: Diagnosis not present

## 2021-11-08 DIAGNOSIS — R0602 Shortness of breath: Secondary | ICD-10-CM | POA: Diagnosis not present

## 2021-11-08 DIAGNOSIS — M7989 Other specified soft tissue disorders: Secondary | ICD-10-CM | POA: Diagnosis not present

## 2021-11-08 NOTE — Progress Notes (Signed)
[facility] [place] Telephone:([phone]   Fax:([phone]) [phone]  OFFICE PROGRESS NOTE  Susy Frizzle, MD [address] [place] [address]  DIAGNOSIS: history of a bilateral stage IA non-small cell lung cancer involving the left upper lobe as well as right upper lobe  PRIOR THERAPY: 1)  status post wedge resection of the 2 lesions in 2011 under the care of Dr. Arlyce Dice.  2) systemic chemotherapy with carboplatin, Alimta and Avastin for 6 cycles at [facility] completed in 2011. 3) stereotactic radiotherapy to the enlarging left upper lobe lung nodule in 2014.  CURRENT THERAPY: Observation.  INTERVAL HISTORY: [name] 81 y.o. female returns to the clinic today for annual follow-up visit.  The patient is feeling fine today with no concerning complaints except for swelling in the left lower extremity as well as shortness of breath with exertion.  She was seen by her primary care physician and had extensive work-up done for the evaluation of the swelling and there was no evidence for DVT.  She was treated with torsemide.  She denied having any current chest pain, cough or hemoptysis.  She denied having any fever or chills.  She has no nausea, vomiting, diarrhea or constipation.  She has no headache or visual changes.  She had repeat CT scan of the chest performed yesterday and she is here for evaluation and discussion of her scan results.  MEDICAL HISTORY: Past Medical History:  Diagnosis Date   ADD (attention deficit disorder)    Allergic rhinitis    Anemia    Anxiety    Arrhythmia    Arthritis    "hands, neck, right shoulder" (07/28/2015)   Aseptic necrosis ([place])    Asthma dxc'd 05/2015   Basal cell cancer    Bleeding stomach ulcer ~ 06/2005   COPD (chronic obstructive pulmonary disease) (HCC)    Depression    Diverticula of colon    Dyspnea    Esophagitis    Gastric ulcer with hemorrhage    GERD (gastroesophageal reflux  disease)    Heart murmur    History of blood transfusion 06/2005   "related to pneumonia"   History of kidney stones    Hyperlipidemia    Hypertension    Hypothyroidism    Kidney stones    Lung cancer ([place])    Non small cell lung cancer, adenocarcinoma with BAC 09/2009, VATS converted to wedge resection of left upper lobe mass   Mitral valve prolapse    "no ORs" (07/28/2015)   Osteopenia    Pneumonia 1970's; 06/2005   Sleep apnea dx'd 06/2015   on oxygen at nite at 2L/[place]    Squamous cell skin cancer     ALLERGIES:  is allergic to amoxicillin, doxycycline, erythromycin, and sulfonamide derivatives.  MEDICATIONS:  Current Outpatient Medications  Medication Sig Dispense Refill   acetaminophen (TYLENOL) 325 MG tablet Take by mouth.     albuterol (VENTOLIN HFA) 108 (90 Base) MCG/ACT inhaler Inhale 1 puff into the lungs every 6 (six) hours as needed for wheezing or shortness of breath.     Calcium Carbonate-Vit D-Min (CALCIUM 1200 PO) Take 50 mg by mouth. ONCE A WEEK     co-enzyme Q-10 30 MG capsule Take 100 mg by mouth 3 (three) times daily.     Cyanocobalamin 1000 MCG CAPS Take by mouth.     doxazosin (CARDURA) 4 MG tablet TAKE 1 TABLET DAILY 90 tablet 3  DULoxetine (CYMBALTA) 60 MG capsule Take 1 capsule (60 mg total) by mouth daily. 90 capsule 3   fluticasone (FLONASE) 50 MCG/ACT nasal spray USE 2 SPRAYS IN EACH NOSTRIL DAILY 48 g 3   gabapentin (NEURONTIN) 100 MG capsule Take 1 capsule (100 mg total) by mouth 3 (three) times daily. 90 capsule 3   LEVOXYL 112 MCG tablet TAKE 1 TABLET DAILY (DISCONTINUE LEVOTHYROXINE 100) 90 tablet 3   losartan-hydrochlorothiazide (HYZAAR) 100-25 MG tablet TAKE 1 TABLET DAILY 90 tablet 3   Multiple Vitamins-Minerals (EMERGEN-C FIVE PO) Take by mouth.     omeprazole (PRILOSEC) 20 MG capsule Take 1 capsule (20 mg total) by mouth daily. 90 capsule 3   OXYGEN 2lpm with sleep     rosuvastatin (CRESTOR) 20 MG tablet Take 1 tablet (20 mg total) by mouth  daily. 90 tablet 3   temazepam (RESTORIL) 15 MG capsule Take 1 capsule (15 mg total) by mouth at bedtime as needed for sleep. 30 capsule 0   thiamine (VITAMIN B-1) 100 MG tablet Take 100 mg by mouth daily.     TRELEGY ELLIPTA 100-62.5-25 MCG/INH AEPB USE 1 INHALATION DAILY (DISCONTINUE BEVESPI) 180 each 3   No current facility-administered medications for this visit.    SURGICAL HISTORY:  Past Surgical History:  Procedure Laterality Date   ABDOMINAL HYSTERECTOMY  1988   COLONOSCOPY WITH PROPOFOL N/A 06/15/2017   Procedure: COLONOSCOPY WITH PROPOFOL;  Surgeon: Carol Ada, MD;  Location: [facility];  Service: Endoscopy;  Laterality: N/A;   HAMMER TOE SURGERY Right ~ 2010   SALPINGOOPHORECTOMY Left 1988   SKIN CANCER EXCISION     "I've had severl cut off RLE; left foreqrm, nose under nose"   THORACOTOMY Left 06/2005   THORACOTOMY/LOBECTOMY  09/2009; 11/2009   left; right    REVIEW OF SYSTEMS:  A comprehensive review of systems was negative except for: Respiratory: positive for dyspnea on exertion   PHYSICAL EXAMINATION: General appearance: alert, cooperative, and no distress Head: Normocephalic, without obvious abnormality, atraumatic Neck: no adenopathy, no JVD, supple, symmetrical, trachea midline, and thyroid not enlarged, symmetric, no tenderness/mass/nodules Lymph nodes: Cervical, supraclavicular, and axillary nodes normal. Resp: clear to auscultation bilaterally Back: symmetric, no curvature. ROM normal. No CVA tenderness. Cardio: regular rate and rhythm, S1, S2 normal, no murmur, click, rub or gallop GI: soft, non-tender; bowel sounds normal; no masses,  no organomegaly Extremities: extremities normal, atraumatic, no cyanosis or edema  ECOG PERFORMANCE STATUS: 1 - Symptomatic but completely ambulatory  Blood pressure 140/81, pulse 98, temperature 99.7 F (37.6 C), temperature source Tympanic, resp. rate 20, height 5\' 6"  (1.676 m), weight 203 lb 9.6 oz (92.4 kg), SpO2 97  %.  LABORATORY DATA: Lab Results  Component Value Date   WBC 7.5 11/07/2021   HGB 12.5 11/07/2021   HCT 37.7 11/07/2021   MCV 98.4 11/07/2021   PLT 270 11/07/2021      Chemistry      Component Value Date/Time   NA 139 11/07/2021 1003   K 3.5 11/07/2021 1003   CL 103 11/07/2021 1003   CO2 28 11/07/2021 1003   BUN 12 11/07/2021 1003   CREATININE 0.83 11/07/2021 1003   CREATININE 0.76 10/28/2021 1225      Component Value Date/Time   CALCIUM 9.2 11/07/2021 1003   ALKPHOS 50 11/07/2021 1003   AST 23 11/07/2021 1003   ALT 24 11/07/2021 1003   BILITOT 0.4 11/07/2021 1003       RADIOGRAPHIC STUDIES: No results found.  ASSESSMENT AND PLAN: This is a very pleasant 81 years old white female with history of bilateral stage Ia non-small cell lung cancer involving the left upper lobe as well as the right upper lobe status post wedge resection of the 2 lesions in 2011 under the care of Dr. Arlyce Dice.  The patient also received adjuvant systemic chemotherapy for 6 cycles at [place] [facility] with carboplatin, Alimta and Avastin.  She also received stereotactic radiotherapy to the enlarging left upper lobe lung nodule in 2014.  The patient is currently on observation and she is feeling fine with no concerning complaints except for mild shortness of breath with exertion. She had repeat CT scan of the chest performed yesterday.  I personally and independently reviewed the scan images and compared to the previous scan a year ago.  I do not see any concerning findings for disease progression but I will wait for the final report for confirmation. If her scan showed no concerning findings, I will see her back for follow-up visit in 1 year for evaluation and repeat CT scan of the chest. The patient was advised to call immediately if she has any other concerning symptoms in the interval.  The patient voices understanding of current disease status and treatment options and is in agreement  with the current care plan.  All questions were answered. The patient knows to call the clinic with any problems, questions or concerns. We can certainly see the patient much sooner if necessary.   Disclaimer: This note was dictated with voice recognition software. Similar sounding words can inadvertently be transcribed and may not be corrected upon review.

## 2021-11-22 NOTE — Telephone Encounter (Signed)
e

## 2021-11-28 ENCOUNTER — Ambulatory Visit (INDEPENDENT_AMBULATORY_CARE_PROVIDER_SITE_OTHER): Payer: Medicare Other | Admitting: Nurse Practitioner

## 2021-11-28 ENCOUNTER — Other Ambulatory Visit: Payer: Self-pay

## 2021-11-28 ENCOUNTER — Encounter: Payer: Self-pay | Admitting: Nurse Practitioner

## 2021-11-28 VITALS — BP 122/80 | HR 80 | Temp 98.3°F | Ht 66.0 in | Wt 201.0 lb

## 2021-11-28 DIAGNOSIS — C3480 Malignant neoplasm of overlapping sites of unspecified bronchus and lung: Secondary | ICD-10-CM | POA: Diagnosis not present

## 2021-11-28 DIAGNOSIS — J449 Chronic obstructive pulmonary disease, unspecified: Secondary | ICD-10-CM

## 2021-11-28 DIAGNOSIS — G4734 Idiopathic sleep related nonobstructive alveolar hypoventilation: Secondary | ICD-10-CM

## 2021-11-28 DIAGNOSIS — J302 Other seasonal allergic rhinitis: Secondary | ICD-10-CM | POA: Diagnosis not present

## 2021-11-28 MED ORDER — TRELEGY ELLIPTA 100-62.5-25 MCG/ACT IN AEPB: 1.0000 | INHALATION_SPRAY | Freq: Every day | RESPIRATORY_TRACT | 5 refills | Status: DC

## 2021-11-28 NOTE — Progress Notes (Signed)
? ?@Patient  ID: [name], female    DOB: 1940/12/21, 81 y.o.   MRN: [id number] ? ?Chief Complaint  ?Patient presents with  ? Follow-up  ?  She is about the same and short of breath with exertion.   ? ? ?Referring provider: ?Susy Frizzle, MD ? ?HPI: ?81 year old female, former smoker (22.5 pack years) followed for COPD with asthma and nocturnal hypoxemia. She is a patient of Dr. Gustavus Bryant and last seen in office 07/06/2020. She has a history of NSCLC s/p resection in 2011 followed by chemo and radiation. She is currently under surveillance and follows with Dr. Julien Nordmann. Past medical history significant for HTN, PVCs, allergic rhinitis, GERD, hypothyroid, MVP, polycythemia, anxiety, ADD, MDD.  ? ?TEST/EVENTS:  ?07/19/2018 PFTs: FVC 2.4 (82), FEV1 1.63 (74), ratio 68, TLC 91%, DLCOcor 70%. +BD. Moderate obstructive airway disease with decreased diffusion capacity  ?02/10/2019 echocardiogram: EF 55-60%. Mild LVH. Trivial pericardial effusion. Mild thickening of the mitral valve leaflet. Trivial MR.  ?11/08/2021 CT chest w contrast: atherosclerosis. Moderate sized hiatal hernia. S/p RUL with compensatory hyperexpansion or RM And RLL and chronic postradiation mass-like fibrosis in LUL. Few scattered small pulmonary nodules, largest in RLL measuring 1.9x1.4 cm, stable and considered benign.  ? ?07/06/2020: OV with Dr. Melvyn Novas. Maintained on Trelegy. Uses supplemental O2 2 lpm at night. Walking oximetry without desaturations.  ? ?11/28/2021: Today - follow up ?Patient presents today for overdue follow up. She reports that her breathing is relatively stable. She still has shortness of breath with exertion but it has been relatively consistent since she was seen last. She does feel better on the Trelegy inhaler than she did before. She has also started exercising more, but feels like her activity tolerance is lacking. 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 She denies any recent exacerbations requiring prednisone, cough, wheezing, orthopnea, or PND. She did have some lower extremity swelling in her left leg a few months ago, which was treated with diuretics and has since resolved. She rarely uses her albuterol inhaler.  ? ?Allergies  ?Allergen Reactions  ? Sulfonamide Derivatives Hives  ? Amoxicillin Diarrhea  ?  Diarrhea, doesnt tolerate   ? Doxycycline Nausea Only  ? Erythromycin Nausea Only  ? ? ?Immunization History  ?Administered Date(s) Administered  ? Fluad Quad(high Dose 65+) 05/13/2019, 06/29/2020  ? Influenza, High Dose Seasonal PF 06/22/2015  ? Influenza,inj,Quad PF,6+ Mos 05/29/2014, 05/30/2016, 05/28/2017, 05/21/2018  ? Influenza-Unspecified 05/29/2014, 06/23/2015, 05/30/2016, 05/28/2017, 05/21/2018  ? Moderna Sars-Covid-2 Vaccination 09/24/2019, 10/22/2019, 06/03/2020  ? Pneumococcal Conjugate-13 01/16/2014  ? Pneumococcal Polysaccharide-23 07/03/2012, 05/10/2020  ? Td 09/12/1993  ? Tdap 12/21/2012  ? Zoster Recombinat (Shingrix) 01/04/2020  ? Zoster, Live 09/27/2007  ? ? ?Past Medical History:  ?Diagnosis Date  ? ADD (attention deficit disorder)   ? Allergic rhinitis   ? Anemia   ? Anxiety   ? Arrhythmia   ? Arthritis   ? "hands, neck, right shoulder" (07/28/2015)  ? Aseptic necrosis ([place])   ? Asthma dxc'd 05/2015  ? Basal cell cancer   ? Bleeding stomach ulcer ~ 06/2005  ? COPD (chronic obstructive pulmonary disease) ([place])   ? Depression   ? Diverticula of colon   ? Dyspnea   ? Esophagitis   ? Gastric ulcer with hemorrhage   ? GERD (gastroesophageal reflux disease)   ? Heart murmur   ? History of blood  transfusion 06/2005  ? "related to pneumonia"  ? History of kidney stones   ? Hyperlipidemia   ? Hypertension   ? Hypothyroidism   ? Kidney stones   ? Lung cancer ([place])   ? Non small cell lung cancer, adenocarcinoma with BAC 09/2009, VATS converted to wedge resection of left upper lobe mass  ? Mitral valve prolapse   ? "no ORs"  (07/28/2015)  ? Osteopenia   ? Pneumonia 1970's; 06/2005  ? Sleep apnea dx'd 06/2015  ? on oxygen at nite at 2L/[place]   ? Squamous cell skin cancer   ? ? ?Tobacco History: ?Social History  ? ?Tobacco Use  ?Smoking Status Former  ? Packs/day: 0.75  ? Years: 30.00  ? Pack years: 22.50  ? Types: Cigarettes  ? Quit date: 09/11/2004  ? Years since quitting: 17.2  ?Smokeless Tobacco Never  ? ?Counseling given: Not Answered ? ? ?Outpatient Medications Prior to Visit  ?Medication Sig Dispense Refill  ? acetaminophen (TYLENOL) 325 MG tablet Take by mouth.    ? albuterol (VENTOLIN HFA) 108 (90 Base) MCG/ACT inhaler Inhale 1 puff into the lungs every 6 (six) hours as needed for wheezing or shortness of breath.    ? Calcium Carbonate-Vit D-Min (CALCIUM 1200 PO) Take 50 mg by mouth. ONCE A WEEK    ? co-enzyme Q-10 30 MG capsule Take 100 mg by mouth 3 (three) times daily.    ? Cyanocobalamin 1000 MCG CAPS Take by mouth.    ? doxazosin (CARDURA) 4 MG tablet TAKE 1 TABLET DAILY 90 tablet 3  ? DULoxetine (CYMBALTA) 60 MG capsule Take 1 capsule (60 mg total) by mouth daily. 90 capsule 3  ? fluticasone (FLONASE) 50 MCG/ACT nasal spray USE 2 SPRAYS IN EACH NOSTRIL DAILY 48 g 3  ? gabapentin (NEURONTIN) 100 MG capsule Take 1 capsule (100 mg total) by mouth 3 (three) times daily. 90 capsule 3  ? LEVOXYL 112 MCG tablet TAKE 1 TABLET DAILY (DISCONTINUE LEVOTHYROXINE 100) 90 tablet 3  ? losartan-hydrochlorothiazide (HYZAAR) 100-25 MG tablet TAKE 1 TABLET DAILY 90 tablet 3  ? Multiple Vitamins-Minerals (EMERGEN-C FIVE PO) Take by mouth.    ? omeprazole (PRILOSEC) 20 MG capsule Take 1 capsule (20 mg total) by mouth daily. 90 capsule 3  ? OXYGEN 2lpm with sleep    ? rosuvastatin (CRESTOR) 20 MG tablet Take 1 tablet (20 mg total) by mouth daily. 90 tablet 3  ? temazepam (RESTORIL) 15 MG capsule Take 1 capsule (15 mg total) by mouth at bedtime as needed for sleep. 30 capsule 0  ? thiamine (VITAMIN B-1) 100 MG tablet Take 100 mg by mouth daily.    ?  TRELEGY ELLIPTA 100-62.5-25 MCG/INH AEPB USE 1 INHALATION DAILY (DISCONTINUE BEVESPI) 180 each 3  ? ?No facility-administered medications prior to visit.  ? ? ? ?Review of Systems:  ? ?Constitutional: No weight loss or gain, night sweats, fevers, chills, fatigue, or lassitude. ?HEENT: No headaches, difficulty swallowing, tooth/dental problems, or sore throat. No sneezing, itching, ear ache, nasal congestion, or post nasal drip ?CV:  No chest pain, orthopnea, PND, swelling in lower extremities, anasarca, dizziness, palpitations, syncope ?Resp: +shortness of breath with exertion. No excess mucus or change in color of mucus. No productive or non-productive. No hemoptysis. No wheezing.  No chest wall deformity ?Skin: No rash, lesions, ulcerations ?MSK:  No joint pain or swelling.  No decreased range of motion.  No back pain. ?Neuro: No dizziness or lightheadedness.  ?Psych: No depression or anxiety.  Mood stable.  ? ? ? ?Physical Exam: ? ?BP 122/80 (BP Location: Right Arm, Patient Position: Sitting, Cuff Size: Normal)   Pulse 80   Temp 98.3 ?F (36.8 ?C) (Oral)   Ht 5\' 6"  (1.676 m)   Wt 201 lb (91.2 kg)   SpO2 97%   BMI 32.44 kg/m?  ? ?GEN: Pleasant, interactive, well-appearing; obese; in no acute distress. ?HEENT:  Normocephalic and atraumatic. PERRLA. Sclera white. Nasal turbinates pink, moist and patent bilaterally. No rhinorrhea present. Oropharynx pink and moist, without exudate or edema. No lesions, ulcerations, or postnasal drip.  ?NECK:  Supple w/ fair ROM. No JVD present. Normal carotid impulses w/o bruits. Thyroid symmetrical with no goiter or nodules palpated. No lymphadenopathy.   ?CV: RRR, no m/r/g, no peripheral edema. Pulses intact, +2 bilaterally. No cyanosis, pallor or clubbing. ?PULMONARY:  Unlabored, regular breathing. Clear bilaterally A&P w/o wheezes/rales/rhonchi. No accessory muscle use. No dullness to percussion. ?GI: BS present and normoactive. Soft, non-tender to palpation.  ?MSK: No  erythema, warmth or tenderness. Cap refil <2 sec all extrem. No deformities or joint swelling noted.  ?Neuro: A/Ox3. No focal deficits noted.   ?Skin: Warm, no lesions or rashes ?Psych: Normal affect and behavio

## 2021-11-28 NOTE — Patient Instructions (Addendum)
Continue Albuterol inhaler 2 puffs every 6 hours as needed for shortness of breath or wheezing. Notify if symptoms persist despite rescue inhaler/neb use. ?Continue Trelegy 1 puff daily. Brush tongue and rinse mouth afterwards ?Continue flonase 2 sprays each nostril daily ?Continue omeprazole 20 mg daily  ?Continue supplemental oxygen 2 lpm at night. Monitor oxygen at home. Goal oxygen saturation >88-90% ? ?Notify if worsening breathlessness, cough, mucus production, fatigue, or wheezing occurs.  ?Maintain up to date vaccinations, including influenza, COVID, and pneumococcal.  ?Wash your hands often and avoid sick exposures.  ?Encouraged masking in crowds.  ?Avoid triggers, when possible.  ?Exercise, as tolerated. Notify if worsening symptoms upon exertion occur.  ? ?Follow up after PFTs with Dr. Melvyn Novas or Alanson Aly. If symptoms do not improve or worsen, please contact office for sooner follow up or seek emergency care. ?

## 2021-11-28 NOTE — Assessment & Plan Note (Signed)
Continues to experience high symptom burden with limited activity tolerance. Currently on Trelegy. Will repeat PFTs for further evaluation. May need to change from DPI to [facility] if lung function worse as she may not generate enough airflow to generate DPI. Walking oximetry without desaturations today but with SOB.  ? ?Patient Instructions  ?Continue Albuterol inhaler 2 puffs every 6 hours as needed for shortness of breath or wheezing. Notify if symptoms persist despite rescue inhaler/neb use. ?Continue Trelegy 1 puff daily. Brush tongue and rinse mouth afterwards ?Continue flonase 2 sprays each nostril daily ?Continue omeprazole 20 mg daily  ?Continue supplemental oxygen 2 lpm at night. Monitor oxygen at home. Goal oxygen saturation >88-90% ? ?Notify if worsening breathlessness, cough, mucus production, fatigue, or wheezing occurs.  ?Maintain up to date vaccinations, including influenza, COVID, and pneumococcal.  ?Wash your hands often and avoid sick exposures.  ?Encouraged masking in crowds.  ?Avoid triggers, when possible.  ?Exercise, as tolerated. Notify if worsening symptoms upon exertion occur.  ? ?Follow up after PFTs with Dr. Melvyn Novas or Alanson Aly. If symptoms do not improve or worsen, please contact office for sooner follow up or seek emergency care. ? ? ?

## 2021-11-28 NOTE — Assessment & Plan Note (Signed)
Well-controlled on current regimen. ?

## 2021-11-28 NOTE — Assessment & Plan Note (Signed)
Currently on 2lpm at night. Monitor O2 at home for goal >88-90%. ?

## 2021-11-28 NOTE — Assessment & Plan Note (Signed)
Currently under surveillance with recent stable CT chest. Follow up with Dr. Julien Nordmann as scheduled.  ?

## 2021-12-27 ENCOUNTER — Other Ambulatory Visit: Payer: Self-pay | Admitting: Family Medicine

## 2021-12-29 ENCOUNTER — Other Ambulatory Visit: Payer: Self-pay | Admitting: Family Medicine

## 2021-12-29 MED ORDER — TEMAZEPAM 15 MG PO CAPS: 15.0000 mg | ORAL_CAPSULE | Freq: Every evening | ORAL | 0 refills | Status: DC | PRN

## 2021-12-29 NOTE — Telephone Encounter (Signed)
LOV 10/28/21 ?Last refill 10/17/21, #30, 0 refills ? ?Please review, thanks! ? ?

## 2021-12-30 ENCOUNTER — Encounter: Payer: Self-pay | Admitting: Family Medicine

## 2022-01-02 ENCOUNTER — Other Ambulatory Visit: Payer: Self-pay | Admitting: Family Medicine

## 2022-01-02 MED ORDER — FLUOXETINE HCL 20 MG PO TABS: 20.0000 mg | ORAL_TABLET | Freq: Every day | ORAL | 3 refills | Status: DC

## 2022-01-02 NOTE — Telephone Encounter (Signed)
Dr. Pickard sent in Rx  ?

## 2022-01-09 ENCOUNTER — Encounter: Payer: Self-pay | Admitting: Family Medicine

## 2022-01-16 ENCOUNTER — Encounter: Payer: Self-pay | Admitting: Family Medicine

## 2022-01-16 ENCOUNTER — Other Ambulatory Visit: Payer: Self-pay | Admitting: Family Medicine

## 2022-01-17 NOTE — Telephone Encounter (Signed)
Requested Prescriptions  ?Pending Prescriptions Disp Refills  ?? losartan-hydrochlorothiazide (HYZAAR) 100-25 MG tablet [Pharmacy Med Name: LOSARTAN/ HYDROCHLOROTHIAZIDE TABS 100/25MG] 90 tablet 0  ?  Sig: TAKE 1 TABLET DAILY  ?  ? Cardiovascular: ARB + Diuretic Combos Passed - 01/16/2022  3:40 AM  ?  ?  Passed - K in normal range and within 180 days  ?  Potassium  ?Date Value Ref Range Status  ?11/07/2021 3.5 3.5 - 5.1 mmol/L Final  ?   ?  ?  Passed - Na in normal range and within 180 days  ?  Sodium  ?Date Value Ref Range Status  ?11/07/2021 139 135 - 145 mmol/L Final  ?   ?  ?  Passed - Cr in normal range and within 180 days  ?  Creatinine  ?Date Value Ref Range Status  ?11/07/2021 0.83 0.44 - 1.00 mg/dL Final  ? ?Creat  ?Date Value Ref Range Status  ?10/28/2021 0.76 0.60 - 1.00 mg/dL Final  ?   ?  ?  Passed - eGFR is 10 or above and within 180 days  ?  GFR, Est African American  ?Date Value Ref Range Status  ?07/15/2020 87 > OR = 60 mL/min/1.80m Final  ? ?GFR, Est Non African American  ?Date Value Ref Range Status  ?07/15/2020 75 > OR = 60 mL/min/1.727mFinal  ? ?GFR, Estimated  ?Date Value Ref Range Status  ?11/07/2021 >60 >60 mL/min Final  ?  Comment:  ?  (NOTE) ?Calculated using the CKD-EPI Creatinine Equation (2021) ?  ? ?GFR  ?Date Value Ref Range Status  ?06/29/2017 83.91 >60.00 mL/min Final  ? ?eGFR  ?Date Value Ref Range Status  ?10/28/2021 80 > OR = 60 mL/min/1.7350minal  ?  Comment:  ?  The eGFR is based on the CKD-EPI 2021 equation. To calculate  ?the new eGFR from a previous Creatinine or Cystatin C ?result, go to https://www.kidney.org/professionals/ ?kdoqi/gfr%5Fcalculator ?  ?   ?  ?  Passed - Patient is not pregnant  ?  ?  Passed - Last BP in normal range  ?  BP Readings from Last 1 Encounters:  ?11/28/21 122/80  ?   ?  ?  Passed - Valid encounter within last 6 months  ?  Recent Outpatient Visits   ?      ? 2 months ago [facility]  [facility], War[facility]ie McgeeD  ? 3  months [facility]  ? 3 months [facility]  ? 4 months ago [facility]arca  [facility], War[facility]ammie McgeeD  ? 4 months ago [facility]ical chest pain  [facility] War[facility]geeD  ?  ?  ?Future Appointments   ?        ? In 2 days Pickard, War[facility]ammie McgeeD [facility]  ?  ? ?  ?  ?  ? ?

## 2022-01-19 ENCOUNTER — Ambulatory Visit (INDEPENDENT_AMBULATORY_CARE_PROVIDER_SITE_OTHER): Payer: Medicare Other | Admitting: Family Medicine

## 2022-01-19 VITALS — BP 128/72 | HR 87 | Temp 97.7°F | Ht 66.0 in | Wt 195.8 lb

## 2022-01-19 DIAGNOSIS — R42 Dizziness and giddiness: Secondary | ICD-10-CM

## 2022-01-19 MED ORDER — MECLIZINE HCL 25 MG PO TABS: 25.0000 mg | ORAL_TABLET | Freq: Three times a day (TID) | ORAL | 0 refills | Status: DC | PRN

## 2022-01-19 NOTE — Progress Notes (Signed)
? ?Subjective:  ? ? Patient ID: [name], female    DOB: [birth year], 81 y.o.   MRN: [id number] ?For the last few weeks, the patient reports dizziness when she turns her head rapidly to the side.  She states the room will start to spin.  She denies any syncope or near syncope.  She denies any tinnitus.  She does have hearing loss but this is a chronic issue for which she wears hearing aids.  She denies any ear pain.  She does have some mild sinus congestion and rhinorrhea related to allergies for which she is taking Flonase and Zyrtec.  She denies any sinus pain just rhinorrhea and pressure ?Past Medical History:  ?Diagnosis Date  ? ADD (attention deficit disorder)   ? Allergic rhinitis   ? Anemia   ? Anxiety   ? Arrhythmia   ? Arthritis   ? "hands, neck, right shoulder" (07/28/2015)  ? Aseptic necrosis ([place])   ? Asthma dxc'd 05/2015  ? Basal cell cancer   ? Bleeding stomach ulcer ~ 06/2005  ? COPD (chronic obstructive pulmonary disease) ([place])   ? Depression   ? Diverticula of colon   ? Dyspnea   ? Esophagitis   ? Gastric ulcer with hemorrhage   ? GERD (gastroesophageal reflux disease)   ? Heart murmur   ? History of blood transfusion 06/2005  ? "related to pneumonia"  ? History of kidney stones   ? Hyperlipidemia   ? Hypertension   ? Hypothyroidism   ? Kidney stones   ? Lung cancer ([place])   ? Non small cell lung cancer, adenocarcinoma with BAC 09/2009, VATS converted to wedge resection of left upper lobe mass  ? Mitral valve prolapse   ? "no ORs" (07/28/2015)  ? Osteopenia   ? Pneumonia 1970's; 06/2005  ? Sleep apnea dx'd 06/2015  ? on oxygen at nite at 2L/[place]   ? Squamous cell skin cancer   ? ?Past Surgical History:  ?Procedure Laterality Date  ? ABDOMINAL HYSTERECTOMY  1988  ? COLONOSCOPY WITH PROPOFOL N/A 06/15/2017  ? Procedure: COLONOSCOPY WITH PROPOFOL;  Surgeon: Carol Ada, MD;  Location: [facility];  Service: Endoscopy;  Laterality: N/A;  ? HAMMER TOE SURGERY Right ~ 2010  ? SALPINGOOPHORECTOMY  Left 1988  ? SKIN CANCER EXCISION    ? "I've had severl cut off RLE; left foreqrm, nose under nose"  ? THORACOTOMY Left 06/2005  ? THORACOTOMY/LOBECTOMY  09/2009; 11/2009  ? left; right  ? ?Current Outpatient Medications on File Prior to Visit  ?Medication Sig Dispense Refill  ? acetaminophen (TYLENOL) 325 MG tablet Take by mouth.    ? albuterol (VENTOLIN HFA) 108 (90 Base) MCG/ACT inhaler Inhale 1 puff into the lungs every 6 (six) hours as needed for wheezing or shortness of breath.    ? Calcium Carbonate-Vit D-Min (CALCIUM 1200 PO) Take 50 mg by mouth. ONCE A WEEK    ? co-enzyme Q-10 30 MG capsule Take 100 mg by mouth 3 (three) times daily.    ? Cyanocobalamin 1000 MCG CAPS Take by mouth.    ? doxazosin (CARDURA) 4 MG tablet TAKE 1 TABLET DAILY 90 tablet 3  ? FLUoxetine (PROZAC) 20 MG tablet Take 1 tablet (20 mg total) by mouth daily. Stop duloxetine 90 tablet 3  ? fluticasone (FLONASE) 50 MCG/ACT nasal spray USE 2 SPRAYS IN EACH NOSTRIL DAILY 48 g 3  ? Fluticasone-Umeclidin-Vilant (TRELEGY ELLIPTA) 100-62.5-25 MCG/ACT AEPB Inhale 1 puff into the lungs daily. 1 each 5  ?  gabapentin (NEURONTIN) 100 MG capsule Take 1 capsule (100 mg total) by mouth 3 (three) times daily. 90 capsule 3  ? levothyroxine (SYNTHROID) 112 MCG tablet TAKE 1 TABLET DAILY (DISCONTINUE LEVOTHYROXINE 100) 90 tablet 3  ? losartan-hydrochlorothiazide (HYZAAR) 100-25 MG tablet TAKE 1 TABLET DAILY 90 tablet 0  ? Multiple Vitamins-Minerals (EMERGEN-C FIVE PO) Take by mouth.    ? omeprazole (PRILOSEC) 20 MG capsule Take 1 capsule (20 mg total) by mouth daily. 90 capsule 3  ? OXYGEN 2lpm with sleep    ? rosuvastatin (CRESTOR) 20 MG tablet Take 1 tablet (20 mg total) by mouth daily. 90 tablet 3  ? temazepam (RESTORIL) 15 MG capsule Take 1 capsule (15 mg total) by mouth at bedtime as needed for sleep. 30 capsule 0  ? thiamine (VITAMIN B-1) 100 MG tablet Take 100 mg by mouth daily.    ? ?No current facility-administered medications on file prior to visit.   ? ? ?Allergies  ?Allergen Reactions  ? Sulfonamide Derivatives Hives  ? Amoxicillin Diarrhea  ?  Diarrhea, doesnt tolerate   ? Doxycycline Nausea Only  ? Erythromycin Nausea Only  ? ?Social History  ? ?Socioeconomic History  ? Marital status: Married  ?  Spouse name: Not on file  ? Number of children: 3  ? Years of education: Not on file  ? Highest education level: Not on file  ?Occupational History  ? Occupation: retired Marine scientist  ?  Employer: RETIRED  ?Tobacco Use  ? Smoking status: Former  ?  Packs/day: 0.75  ?  Years: 30.00  ?  Pack years: 22.50  ?  Types: Cigarettes  ?  Quit date: 09/11/2004  ?  Years since quitting: 17.3  ? Smokeless tobacco: Never  ?Vaping Use  ? Vaping Use: Never used  ?Substance and Sexual Activity  ? Alcohol use: Yes  ?  Alcohol/week: 7.0 standard drinks  ?  Types: 7 Shots of liquor per week  ?  Comment: 1-2 drinks nightly   ? Drug use: No  ? Sexual activity: Yes  ?Other Topics Concern  ? Not on file  ?Social History Narrative  ? Not on file  ? ?Social Determinants of Health  ? ?Financial Resource Strain: Low Risk   ? Difficulty of Paying Living Expenses: Not hard at all  ?Food Insecurity: No Food Insecurity  ? Worried About Charity fundraiser in the Last Year: Never true  ? Ran Out of Food in the Last Year: Never true  ?Transportation Needs: No Transportation Needs  ? Lack of Transportation (Medical): No  ? Lack of Transportation (Non-Medical): No  ?Physical Activity: Inactive  ? Days of Exercise per Week: 0 days  ? Minutes of Exercise per Session: 0 min  ?Stress: No Stress Concern Present  ? Feeling of Stress : Not at all  ?Social Connections: Moderately Isolated  ? Frequency of Communication with Friends and Family: Twice a week  ? Frequency of Social Gatherings with Friends and Family: More than three times a week  ? Attends Religious Services: Never  ? Active Member of Clubs or Organizations: No  ? Attends Archivist Meetings: Never  ? Marital Status: Married  ?Intimate Partner  Violence: Not At Risk  ? Fear of Current or Ex-Partner: No  ? Emotionally Abused: No  ? Physically Abused: No  ? Sexually Abused: No  ? ? ? ? ?Review of Systems  ?All other systems reviewed and are negative. ? ?   ?Objective:  ? Physical Exam ?  Vitals reviewed.  ?Constitutional:   ?   General: She is not in acute distress. ?   Appearance: Normal appearance. She is normal weight. She is not ill-appearing, toxic-appearing or diaphoretic.  ?HENT:  ?   Right Ear: Tympanic membrane and ear canal normal.  ?   Left Ear: Tympanic membrane and ear canal normal.  ?   Nose: Congestion and rhinorrhea present.  ?   Mouth/Throat:  ?   Mouth: Mucous membranes are moist.  ?Eyes:  ?   Extraocular Movements: Extraocular movements intact.  ?   Pupils: Pupils are equal, round, and reactive to light.  ?Cardiovascular:  ?   Rate and Rhythm: Normal rate and regular rhythm.  ?   Heart sounds: Normal heart sounds.  ?Pulmonary:  ?   Effort: Pulmonary effort is normal.  ?   Breath sounds: Normal breath sounds. No wheezing, rhonchi or rales.  ?Musculoskeletal:  ?   Cervical back: Neck supple.  ?   Right lower leg: No edema.  ?   Left lower leg: No edema.  ?Neurological:  ?   General: No focal deficit present.  ?   Mental Status: She is alert and oriented to person, place, and time. Mental status is at baseline.  ?   Cranial Nerves: No cranial nerve deficit.  ?   Motor: No weakness.  ?   Gait: Gait normal.  ? ? ? ? ? ?   ?Assessment & Plan:  ?Vertigo ?I believe the patient is having vertigo related to BPPV.  We discussed the natural history of this and explain the symptoms will gradually improve with time.  I did give her meclizine 25 mg to be taken every 8 hours as needed for severe vertigo but also cautioned her that the medication can make her sleepy and groggy.  Therefore I recommended she use the medication sparingly only as needed ?

## 2022-02-20 ENCOUNTER — Other Ambulatory Visit: Payer: Self-pay | Admitting: Family Medicine

## 2022-02-21 MED ORDER — TEMAZEPAM 15 MG PO CAPS: 15.0000 mg | ORAL_CAPSULE | Freq: Every evening | ORAL | 0 refills | Status: DC | PRN

## 2022-02-21 NOTE — Telephone Encounter (Signed)
02/20/22 last filled  01/21/22

## 2022-02-23 ENCOUNTER — Encounter: Payer: Self-pay | Admitting: Family Medicine

## 2022-02-24 ENCOUNTER — Other Ambulatory Visit: Payer: Self-pay | Admitting: Family Medicine

## 2022-02-24 MED ORDER — FLUOXETINE HCL 20 MG PO TABS: 40.0000 mg | ORAL_TABLET | Freq: Every day | ORAL | 3 refills | Status: DC

## 2022-03-02 ENCOUNTER — Telehealth: Payer: Medicare Other | Admitting: Physician Assistant

## 2022-03-02 ENCOUNTER — Telehealth: Payer: Self-pay

## 2022-03-02 DIAGNOSIS — K047 Periapical abscess without sinus: Secondary | ICD-10-CM

## 2022-03-02 DIAGNOSIS — M869 Osteomyelitis, unspecified: Secondary | ICD-10-CM

## 2022-03-02 MED ORDER — CLINDAMYCIN HCL 300 MG PO CAPS: 300.0000 mg | ORAL_CAPSULE | Freq: Three times a day (TID) | ORAL | 0 refills | Status: AC

## 2022-03-02 NOTE — Progress Notes (Signed)
Virtual Visit Consent   [name], you are scheduled for a virtual visit with a [facility] provider today. Just as with appointments in the office, your consent must be obtained to participate. Your consent will be active for this visit and any virtual visit you may have with one of our providers in the next 365 days. If you have a MyChart account, a copy of this consent can be sent to you electronically.  As this is a virtual visit, video technology does not allow for your provider to perform a traditional examination. This may limit your provider's ability to fully assess your condition. If your provider identifies any concerns that need to be evaluated in person or the need to arrange testing (such as labs, EKG, etc.), we will make arrangements to do so. Although advances in technology are sophisticated, we cannot ensure that it will always work on either your end or our end. If the connection with a video visit is poor, the visit may have to be switched to a telephone visit. With either a video or telephone visit, we are not always able to ensure that we have a secure connection.  By engaging in this virtual visit, you consent to the provision of healthcare and authorize for your insurance to be billed (if applicable) for the services provided during this visit. Depending on your insurance coverage, you may receive a charge related to this service.  I need to obtain your verbal consent now. Are you willing to proceed with your visit today? [name] has provided verbal consent on 03/02/2022 for a virtual visit (video or telephone). Leeanne Rio, [place]  Date: 03/02/2022 2:45 PM  Virtual Visit via Video Note   I, Leeanne Rio, connected with  [name]  ([id number], 1941-01-07) on 03/02/22 at  2:30 PM EDT by a video-enabled telemedicine application and verified that I am speaking with the correct person using two identifiers.  Location: Patient:  Virtual Visit Location Patient: Home Provider: Virtual Visit Location Provider: Home Office   I discussed the limitations of evaluation and management by telemedicine and the availability of in person appointments. The patient expressed understanding and agreed to proceed.    History of Present Illness: [name] is a 81 y.o. who identifies as a female who was assigned female at birth, and is being seen today for possible dental abscess. She was told that she needs a tooth removed -- left lower canine, and was told by her dentist that she has a mild dental abscess starting at the base of the tooth.  Would require antibiotics before being able to extract tooth. Notes he tried to give her Amoxicillin but she told him she could not take due to intolerance and that it never works for her. He told her to contact her PCP for treatment. She notes her PCP is unable to see her in a timely fashion. Denies fever, chills Only some pain at the base of the tooth itself.  HPI: HPI  Problems:  Patient Active Problem List   Diagnosis Date Noted   Acid reflux 08/30/2021   Diarrhea 08/30/2021   Urinary tract infection symptoms 09/17/2020   Chronic respiratory failure with hypoxia (HCC)/ nocturnal hypoxemia complicated by polycythemia 07/01/2017   Dyspnea on exertion 06/29/2017   ADD (attention deficit disorder)    Multiple pulmonary nodules 05/08/2017   Palliative care encounter    Tobacco abuse 07/29/2015   Anxiety state 07/29/2015   COPD exacerbation ([place]) 07/28/2015  Acute respiratory failure with hypoxia ([place]) 07/28/2015   Polycythemia 07/28/2015   Nocturnal hypoxia 07/26/2015   Encounter for follow-up surveillance of lung cancer 09/29/2014   Atypical chest pain 12/01/2013   Other malaise and fatigue 02/15/2013   Hypothyroidism 02/15/2013   Neck pain 02/15/2013   MDD (major depressive disorder) 03/26/2012   Benign hypertension 03/26/2012   Peptic ulcer disease 03/26/2012   Lung  cancer ([place]) 08/31/2011   PREMATURE VENTRICULAR CONTRACTIONS 07/03/2007   Allergic rhinitis due to allergen 07/03/2007   COPD GOLD II 07/03/2007   EMPYEMA 07/03/2007   GASTROINTESTINAL HEMORRHAGE 07/03/2007   MITRAL VALVE PROLAPSE, HX OF 07/03/2007   OOPHORECTOMY, LEFT, HX OF 07/03/2007    Allergies:  Allergies  Allergen Reactions   Sulfonamide Derivatives Hives   Amoxicillin Diarrhea    Diarrhea, doesnt tolerate    Doxycycline Nausea Only   Erythromycin Nausea Only   Medications:  Current Outpatient Medications:    clindamycin (CLEOCIN) 300 MG capsule, Take 1 capsule (300 mg total) by mouth 3 (three) times daily for 5 days., Disp: 15 capsule, Rfl: 0   acetaminophen (TYLENOL) 325 MG tablet, Take by mouth., Disp: , Rfl:    albuterol (VENTOLIN HFA) 108 (90 Base) MCG/ACT inhaler, Inhale 1 puff into the lungs every 6 (six) hours as needed for wheezing or shortness of breath., Disp: , Rfl:    Calcium Carbonate-Vit D-Min (CALCIUM 1200 PO), Take 50 mg by mouth. ONCE A WEEK, Disp: , Rfl:    co-enzyme Q-10 30 MG capsule, Take 100 mg by mouth 3 (three) times daily., Disp: , Rfl:    Cyanocobalamin 1000 MCG CAPS, Take by mouth., Disp: , Rfl:    doxazosin (CARDURA) 4 MG tablet, TAKE 1 TABLET DAILY, Disp: 90 tablet, Rfl: 3   FLUoxetine (PROZAC) 20 MG tablet, Take 2 tablets (40 mg total) by mouth daily. Stop duloxetine, Disp: 180 tablet, Rfl: 3   fluticasone (FLONASE) 50 MCG/ACT nasal spray, USE 2 SPRAYS IN EACH NOSTRIL DAILY, Disp: 48 g, Rfl: 3   Fluticasone-Umeclidin-Vilant (TRELEGY ELLIPTA) 100-62.5-25 MCG/ACT AEPB, Inhale 1 puff into the lungs daily., Disp: 1 each, Rfl: 5   gabapentin (NEURONTIN) 100 MG capsule, Take 1 capsule (100 mg total) by mouth 3 (three) times daily., Disp: 90 capsule, Rfl: 3   levothyroxine (SYNTHROID) 112 MCG tablet, TAKE 1 TABLET DAILY (DISCONTINUE LEVOTHYROXINE 100), Disp: 90 tablet, Rfl: 3   losartan-hydrochlorothiazide (HYZAAR) 100-25 MG tablet, TAKE 1 TABLET DAILY,  Disp: 90 tablet, Rfl: 0   meclizine (ANTIVERT) 25 MG tablet, Take 1 tablet (25 mg total) by mouth 3 (three) times daily as needed for dizziness., Disp: 30 tablet, Rfl: 0   Multiple Vitamins-Minerals (EMERGEN-C FIVE PO), Take by mouth., Disp: , Rfl:    omeprazole (PRILOSEC) 20 MG capsule, Take 1 capsule (20 mg total) by mouth daily., Disp: 90 capsule, Rfl: 3   OXYGEN, 2lpm with sleep, Disp: , Rfl:    rosuvastatin (CRESTOR) 20 MG tablet, Take 1 tablet (20 mg total) by mouth daily., Disp: 90 tablet, Rfl: 3   temazepam (RESTORIL) 15 MG capsule, Take 1 capsule (15 mg total) by mouth at bedtime as needed for sleep., Disp: 30 capsule, Rfl: 0   thiamine (VITAMIN B-1) 100 MG tablet, Take 100 mg by mouth daily., Disp: , Rfl:   Observations/Objective: Patient is well-developed, well-nourished in no acute distress.  Resting comfortably at home.  Head is normocephalic, atraumatic.  No labored breathing. Speech is clear and coherent with logical content.  Patient is alert and  oriented at baseline.   Assessment and Plan: 1. Dental infection - clindamycin (CLEOCIN) 300 MG capsule; Take 1 capsule (300 mg total) by mouth 3 (three) times daily for 5 days.  Dispense: 15 capsule; Refill: 0  Unfortunately she has multiple antibiotic allergies and intolerances. Will start Clindamycin 300 mg TID x 5 days. She is to take a daily probiotic along with this and continue for 2-3 weeks after completion. Strict PCP follow-up discussed. Follow-up with Dentist as directed.   Follow Up Instructions: I discussed the assessment and treatment plan with the patient. The patient was provided an opportunity to ask questions and all were answered. The patient agreed with the plan and demonstrated an understanding of the instructions.  A copy of instructions were sent to the patient via MyChart unless otherwise noted below.   The patient was advised to call back or seek an in-person evaluation if the symptoms worsen or if the  condition fails to improve as anticipated.  Time:  I spent 10 minutes with the patient via telehealth technology discussing the above problems/concerns.    Leeanne Rio, PA-C

## 2022-03-02 NOTE — Patient Instructions (Signed)
[name], thank you for joining Leeanne Rio, PA-C for today's virtual visit.  While this provider is not your primary care provider (PCP), if your PCP is located in our provider database this encounter information will be shared with them immediately following your visit.  Consent: (Patient) [name] provided verbal consent for this virtual visit at the beginning of the encounter.  Current Medications:  Current Outpatient Medications:    clindamycin (CLEOCIN) 300 MG capsule, Take 1 capsule (300 mg total) by mouth 3 (three) times daily for 5 days., Disp: 15 capsule, Rfl: 0   acetaminophen (TYLENOL) 325 MG tablet, Take by mouth., Disp: , Rfl:    albuterol (VENTOLIN HFA) 108 (90 Base) MCG/ACT inhaler, Inhale 1 puff into the lungs every 6 (six) hours as needed for wheezing or shortness of breath., Disp: , Rfl:    Calcium Carbonate-Vit D-Min (CALCIUM 1200 PO), Take 50 mg by mouth. ONCE A WEEK, Disp: , Rfl:    co-enzyme Q-10 30 MG capsule, Take 100 mg by mouth 3 (three) times daily., Disp: , Rfl:    Cyanocobalamin 1000 MCG CAPS, Take by mouth., Disp: , Rfl:    doxazosin (CARDURA) 4 MG tablet, TAKE 1 TABLET DAILY, Disp: 90 tablet, Rfl: 3   FLUoxetine (PROZAC) 20 MG tablet, Take 2 tablets (40 mg total) by mouth daily. Stop duloxetine, Disp: 180 tablet, Rfl: 3   fluticasone (FLONASE) 50 MCG/ACT nasal spray, USE 2 SPRAYS IN EACH NOSTRIL DAILY, Disp: 48 g, Rfl: 3   Fluticasone-Umeclidin-Vilant (TRELEGY ELLIPTA) 100-62.5-25 MCG/ACT AEPB, Inhale 1 puff into the lungs daily., Disp: 1 each, Rfl: 5   gabapentin (NEURONTIN) 100 MG capsule, Take 1 capsule (100 mg total) by mouth 3 (three) times daily., Disp: 90 capsule, Rfl: 3   levothyroxine (SYNTHROID) 112 MCG tablet, TAKE 1 TABLET DAILY (DISCONTINUE LEVOTHYROXINE 100), Disp: 90 tablet, Rfl: 3   losartan-hydrochlorothiazide (HYZAAR) 100-25 MG tablet, TAKE 1 TABLET DAILY, Disp: 90 tablet, Rfl: 0   meclizine (ANTIVERT) 25 MG  tablet, Take 1 tablet (25 mg total) by mouth 3 (three) times daily as needed for dizziness., Disp: 30 tablet, Rfl: 0   Multiple Vitamins-Minerals (EMERGEN-C FIVE PO), Take by mouth., Disp: , Rfl:    omeprazole (PRILOSEC) 20 MG capsule, Take 1 capsule (20 mg total) by mouth daily., Disp: 90 capsule, Rfl: 3   OXYGEN, 2lpm with sleep, Disp: , Rfl:    rosuvastatin (CRESTOR) 20 MG tablet, Take 1 tablet (20 mg total) by mouth daily., Disp: 90 tablet, Rfl: 3   temazepam (RESTORIL) 15 MG capsule, Take 1 capsule (15 mg total) by mouth at bedtime as needed for sleep., Disp: 30 capsule, Rfl: 0   thiamine (VITAMIN B-1) 100 MG tablet, Take 100 mg by mouth daily., Disp: , Rfl:    Medications ordered in this encounter:  Meds ordered this encounter  Medications   clindamycin (CLEOCIN) 300 MG capsule    Sig: Take 1 capsule (300 mg total) by mouth 3 (three) times daily for 5 days.    Dispense:  15 capsule    Refill:  0    Order Specific Question:   Supervising Provider    Answer:   Sabra Heck, [place]     *If you need refills on other medications prior to your next appointment, please contact your pharmacy*  Follow-Up: Call back or seek an in-person evaluation if the symptoms worsen or if the condition fails to improve as anticipated.  Other Instructions Please take the antibiotic as directed.  Start a daily probiotic over-the-counter and take daily while on antibiotic and for a few weeks after to help prevent any antibiotic-associated diarrhea.  Follow-up with your dental provider as scheduled for extraction. Anything new or worsening in the meantime, please contact your dentist or PCP for evaluation.   If you have been instructed to have an in-person evaluation today at a local Urgent Care facility, please use the link below. It will take you to a list of all of our available [facility], including address, phone number and hours of operation. Please do not delay care.  [facility]  If you or a family member do not have a primary care provider, use the link below to schedule a visit and establish care. When you choose a [facility] primary care physician or advanced practice provider, you gain a long-term partner in health. Find a Primary Care Provider  Learn more about 's in-office and virtual care options: [facility]

## 2022-03-02 NOTE — Telephone Encounter (Signed)
Please call pt to make an appointment per Dr. Dennard Schaumann

## 2022-03-24 ENCOUNTER — Other Ambulatory Visit: Payer: Self-pay | Admitting: Family Medicine

## 2022-03-24 MED ORDER — TEMAZEPAM 15 MG PO CAPS: 15.0000 mg | ORAL_CAPSULE | Freq: Every evening | ORAL | 0 refills | Status: DC | PRN

## 2022-03-28 IMAGING — CT CT CHEST W/ CM
2 of 4 series · 15 of 36 positions shown, 18 images · IV contrast (agent unspecified)
Comparison: Multiple priors, most recently chest CT 11/05/2020.

CLINICAL DATA: 80-year-old female with history of non-small cell
lung cancer status post surgical resection. Staging examination.

* onc *
EXAM:
CT CHEST WITH CONTRAST
TECHNIQUE: Multidetector CT imaging of the chest was performed during
intravenous contrast administration.

[Series 2: axial st · axial · 0.71mm/px · z∈[-371,-49]mm · 12 of 189 slices shown, 15 images]
[im 14/189  mediastinal]
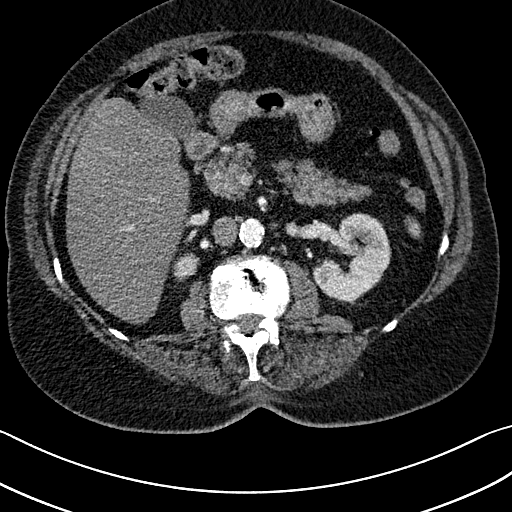
[im 14/189  lung]
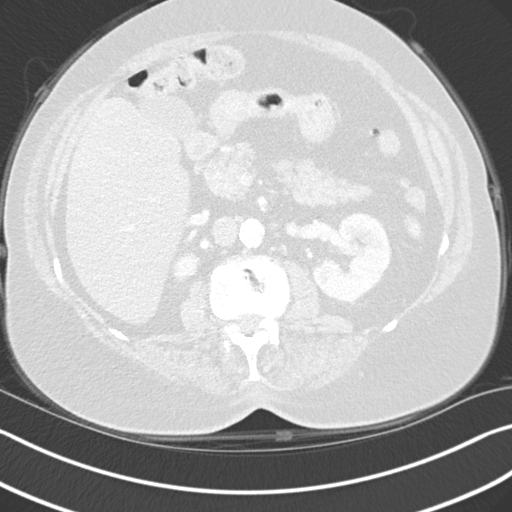
[im 27/189  lung]
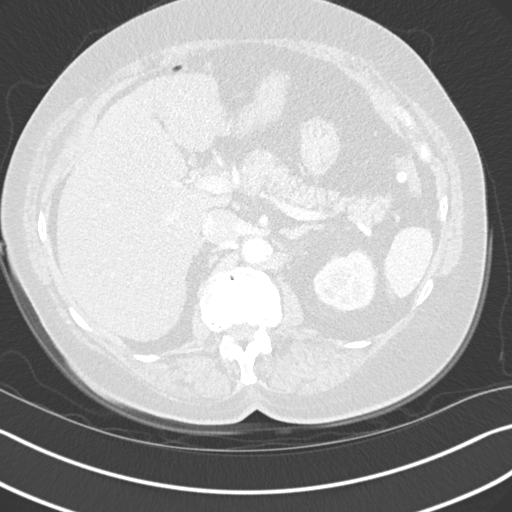
[im 41/189  lung]
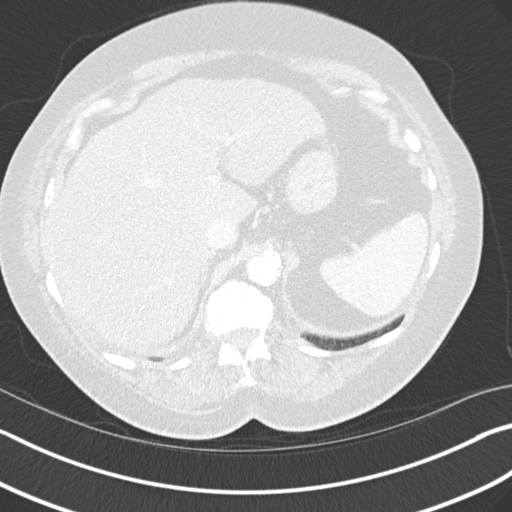
[im 54/189  lung]
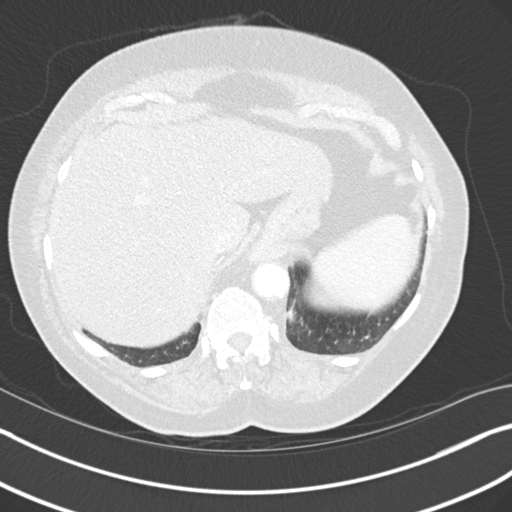
[im 68/189  mediastinal]
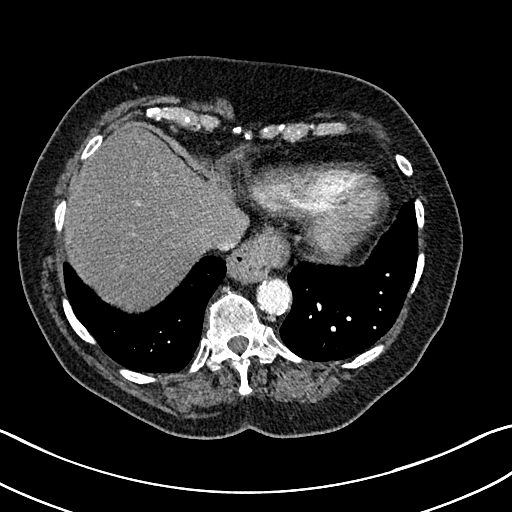
[im 68/189  lung]
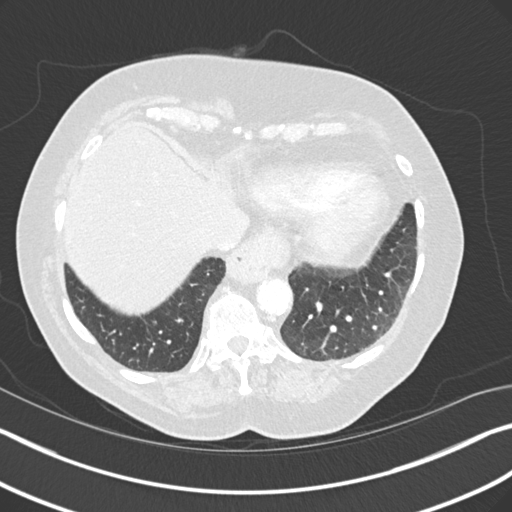
[im 81/189  lung]
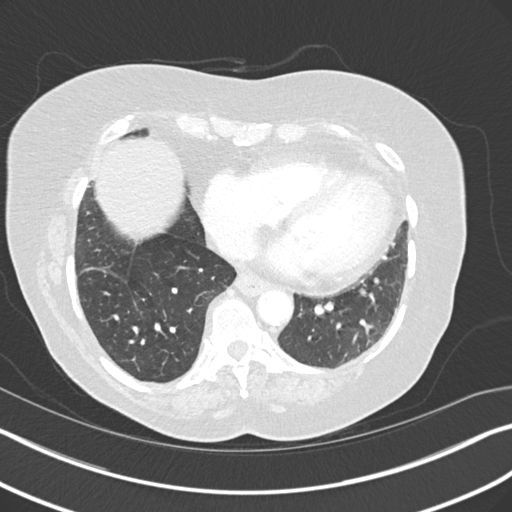
[im 108/189  lung]
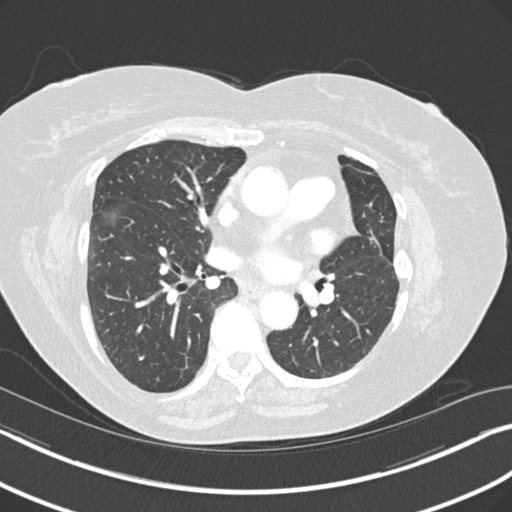
[im 121/189  lung]
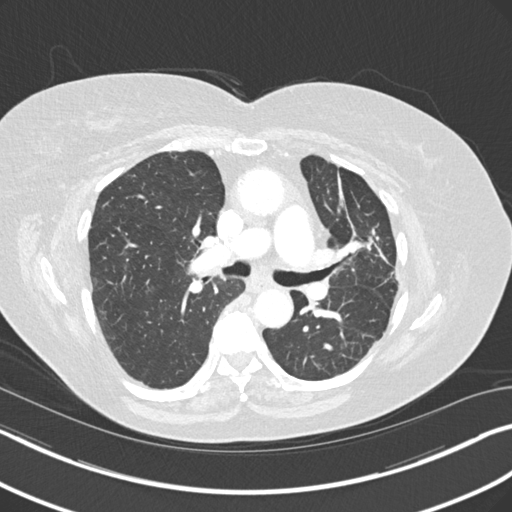
[im 135/189  mediastinal]
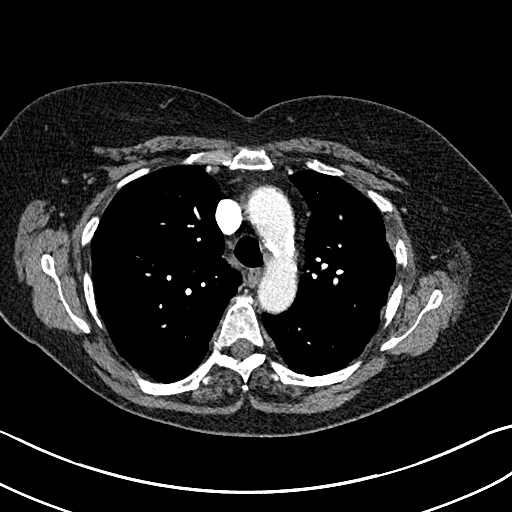
[im 135/189  lung]
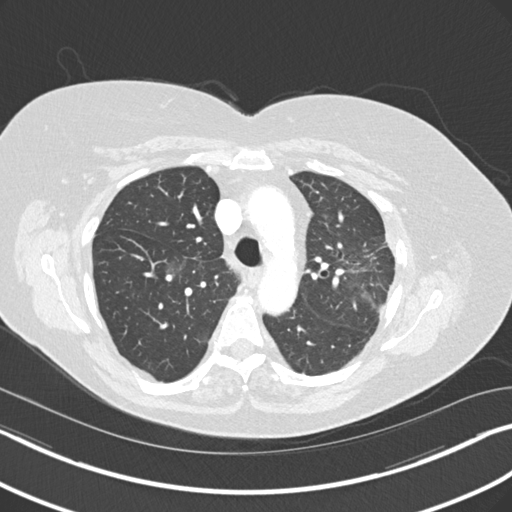
[im 148/189  lung]
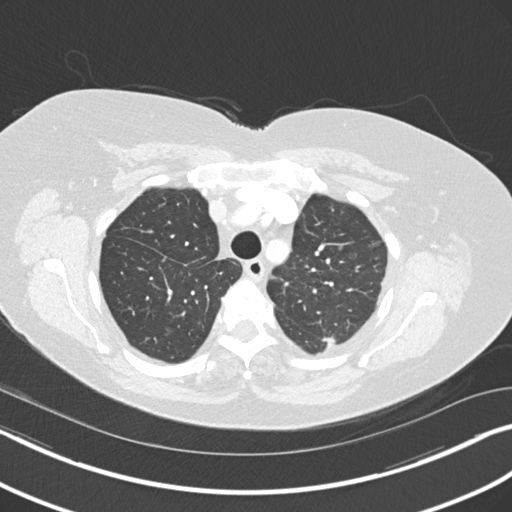
[im 162/189  lung]
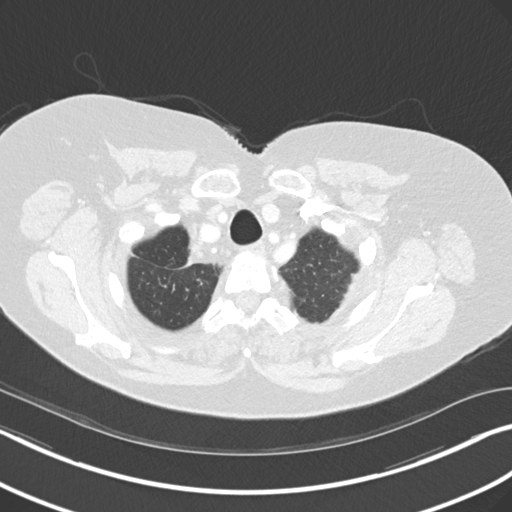
[im 175/189  lung]
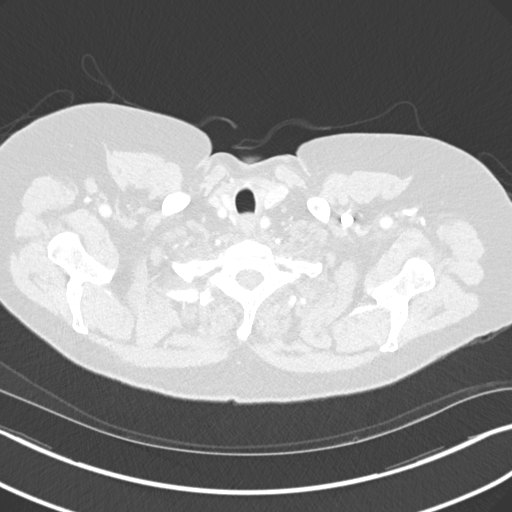

[Series 5: coronal · coronal · 0.74mm/px · 3 of 156 slices shown]
[im 32/156  lung]
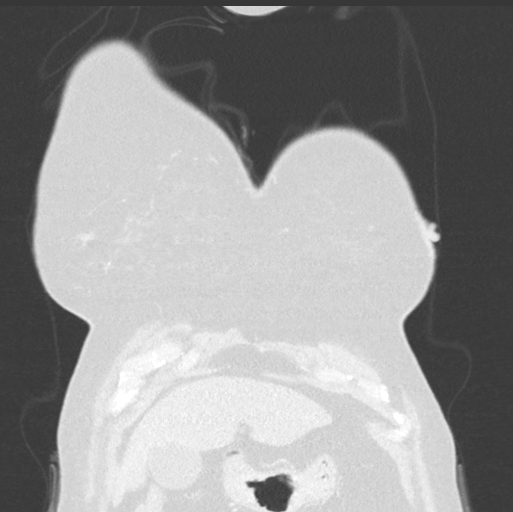
[im 63/156  lung]
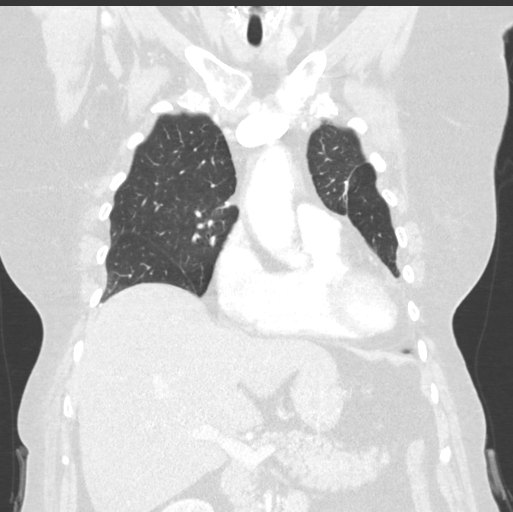
[im 94/156  lung]
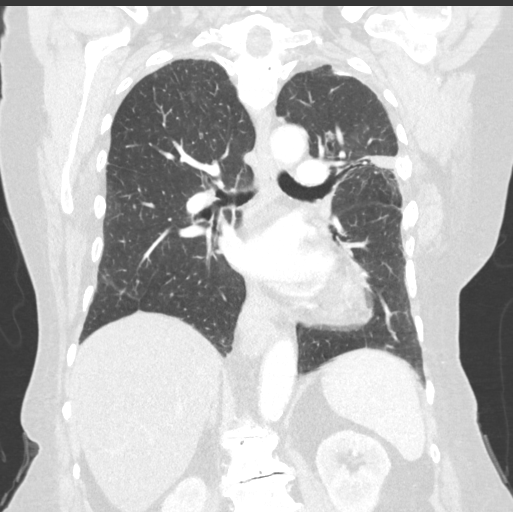

[15 of 36 positions shown; findings below may reference images not displayed]

RADIATION DOSE REDUCTION: This exam was performed according to the
departmental dose-optimization program which includes automated
exposure control, adjustment of the mA and/or kV according to
patient size and/or use of iterative reconstruction technique.

CONTRAST:  75mL OMNIPAQUE IOHEXOL 300 MG/ML  SOLN
FINDINGS: Cardiovascular: Heart size is normal. There is no significant
pericardial fluid, thickening or pericardial calcification. There is
aortic atherosclerosis, as well as atherosclerosis of the great
vessels of the mediastinum and the coronary arteries, including
calcified atherosclerotic plaque in the left anterior descending and
left circumflex coronary arteries. Lipomatous hypertrophy of the
interatrial septum (normal anatomical variant) incidentally noted.

Mediastinum/Nodes: No pathologically enlarged mediastinal or hilar
lymph nodes. Moderate-sized hiatal hernia. No axillary
lymphadenopathy.

Lungs/Pleura: Status post right upper lobectomy with compensatory
hyperexpansion of the right middle and lower lobes. Again noted is a
suture line from wedge resection in the left upper lobe with
adjacent chronic area of mass-like architectural distortion (axial
image 60 of series 7) which is stable in size measuring 3.5 x 2.3 cm
on today's examination. A few scattered small pulmonary nodules are
noted, largest of which is a mixed solid and sub solid lesion in the
right lower lobe with a ground-glass attenuation component (axial
image 83 of series 7) measuring 1.9 x 1.4 cm, and a central solid
component (axial image 84 of series 7) measuring 4 mm, stable. No
other larger more suspicious appearing pulmonary nodules or masses
are noted. No acute consolidative airspace disease. No pleural
effusions.

Upper Abdomen: Aortic atherosclerosis. Diffuse low attenuation
throughout the visualized hepatic parenchyma, indicative of a
background of severe hepatic steatosis.

Musculoskeletal: Multiple old healed bilateral rib fractures are
incidentally noted. There are no aggressive appearing lytic or
blastic lesions noted in the visualized portions of the skeleton.
IMPRESSION: 1. Stable examination demonstrating postoperative changes of right
upper lobectomy and left upper lobe wedge resection with chronic
postradiation mass-like fibrosis in the left upper lobe. Small
pulmonary nodules in the lungs bilaterally appear stable in number
and size compared to prior examinations, favored to be benign.
2. Aortic atherosclerosis, in addition to 2 vessel coronary artery
disease. Assessment for potential risk factor modification, dietary
therapy or pharmacologic therapy may be warranted, if clinically
indicated.
3. Hepatic steatosis.

Aortic Atherosclerosis ([id number]-QN6.6).

## 2022-04-05 ENCOUNTER — Telehealth: Payer: Self-pay | Admitting: Internal Medicine

## 2022-04-05 NOTE — Telephone Encounter (Signed)
Called patient regarding upcoming 2024 appointment, left a voicemail. 

## 2022-04-17 ENCOUNTER — Other Ambulatory Visit: Payer: Self-pay | Admitting: Family Medicine

## 2022-05-08 ENCOUNTER — Other Ambulatory Visit: Payer: Self-pay | Admitting: Family Medicine

## 2022-05-08 ENCOUNTER — Telehealth: Payer: Self-pay | Admitting: Family Medicine

## 2022-05-08 NOTE — Telephone Encounter (Signed)
Patient requesting call back; she'd like to know if she's up to date with all of her immunizations.   Please advise at [phone].

## 2022-05-09 MED ORDER — TEMAZEPAM 15 MG PO CAPS
15.0000 mg | ORAL_CAPSULE | Freq: Every evening | ORAL | 0 refills | Status: DC | PRN
Start: 2022-05-09 — End: 2022-06-20

## 2022-06-01 ENCOUNTER — Ambulatory Visit: Payer: Medicare Other | Admitting: Family Medicine

## 2022-06-08 ENCOUNTER — Ambulatory Visit (INDEPENDENT_AMBULATORY_CARE_PROVIDER_SITE_OTHER): Payer: Medicare Other | Admitting: Family Medicine

## 2022-06-08 VITALS — BP 118/74 | HR 83 | Ht 66.0 in | Wt 193.0 lb

## 2022-06-08 DIAGNOSIS — F411 Generalized anxiety disorder: Secondary | ICD-10-CM | POA: Diagnosis not present

## 2022-06-08 DIAGNOSIS — E039 Hypothyroidism, unspecified: Secondary | ICD-10-CM

## 2022-06-08 DIAGNOSIS — J439 Emphysema, unspecified: Secondary | ICD-10-CM

## 2022-06-08 DIAGNOSIS — I10 Essential (primary) hypertension: Secondary | ICD-10-CM

## 2022-06-08 DIAGNOSIS — F900 Attention-deficit hyperactivity disorder, predominantly inattentive type: Secondary | ICD-10-CM | POA: Diagnosis not present

## 2022-06-08 DIAGNOSIS — Z23 Encounter for immunization: Secondary | ICD-10-CM

## 2022-06-08 DIAGNOSIS — R2689 Other abnormalities of gait and mobility: Secondary | ICD-10-CM

## 2022-06-08 NOTE — Progress Notes (Signed)
Subjective:    Patient ID: [name], female    DOB: 03-Jul-1941, 81 y.o.   MRN: [id number]  Patient presents today for follow-up of chronic medical conditions.  Patient has a remote history of lung cancer status post left upper lobe resection in 2011 for non-small cell lung cancer.  She has been cancer free ever since.  She has a history of COPD for which she is on Trelegy and continues to report dyspnea on exertion.  She had a negative cardiac work-up last year.  Dyspnea on exertion is thought to be secondary to deconditioning, age, and COPD.  She also has a history of depression and anxiety and ADD.  However she seems to be doing quite well.  She does report that she is fallen recently and slipped on the floor.  She reports feeling dizzy and having vertigo when she turns her head to the side.  These reasons make her feel unsteady on her feet.  She feels like she is at a higher fall risk.  She denies any chest pain or nausea or vomiting or diarrhea.  She denies any neuropathy in her feet or leg swelling.  Today her lungs are clear to auscultation bilaterally without any wheezes crackles or rails Past Medical History:  Diagnosis Date   ADD (attention deficit disorder)    Allergic rhinitis    Anemia    Anxiety    Arrhythmia    Arthritis    "hands, neck, right shoulder" (07/28/2015)   Aseptic necrosis ([place])    Asthma dxc'd 05/2015   Basal cell cancer    Bleeding stomach ulcer ~ 06/2005   COPD (chronic obstructive pulmonary disease) ([place])    Depression    Diverticula of colon    Dyspnea    Esophagitis    Gastric ulcer with hemorrhage    GERD (gastroesophageal reflux disease)    Heart murmur    History of blood transfusion 06/2005   "related to pneumonia"   History of kidney stones    Hyperlipidemia    Hypertension    Hypothyroidism    Kidney stones    Lung cancer ([place])    Non small cell lung cancer, adenocarcinoma with BAC 09/2009, VATS converted to wedge resection of left  upper lobe mass   Mitral valve prolapse    "no ORs" (07/28/2015)   Osteopenia    Pneumonia 1970's; 06/2005   Sleep apnea dx'd 06/2015   on oxygen at nite at 2L/    Squamous cell skin cancer    Past Surgical History:  Procedure Laterality Date   ABDOMINAL HYSTERECTOMY  1988   COLONOSCOPY WITH PROPOFOL N/A 06/15/2017   Procedure: COLONOSCOPY WITH PROPOFOL;  Surgeon: Carol Ada, MD;  Location: [facility];  Service: Endoscopy;  Laterality: N/A;   HAMMER TOE SURGERY Right ~ 2010   SALPINGOOPHORECTOMY Left 1988   SKIN CANCER EXCISION     "I've had severl cut off RLE; left foreqrm, nose under nose"   THORACOTOMY Left 06/2005   THORACOTOMY/LOBECTOMY  09/2009; 11/2009   left; right   Current Outpatient Medications on File Prior to Visit  Medication Sig Dispense Refill   acetaminophen (TYLENOL) 325 MG tablet Take by mouth.     albuterol (VENTOLIN HFA) 108 (90 Base) MCG/ACT inhaler Inhale 1 puff into the lungs every 6 (six) hours as needed for wheezing or shortness of breath.     Calcium Carbonate-Vit D-Min (CALCIUM 1200 PO) Take 50 mg by mouth. ONCE A WEEK  co-enzyme Q-10 30 MG capsule Take 100 mg by mouth 3 (three) times daily.     Cyanocobalamin 1000 MCG CAPS Take by mouth.     doxazosin (CARDURA) 4 MG tablet TAKE 1 TABLET DAILY 90 tablet 3   FLUoxetine (PROZAC) 20 MG tablet Take 2 tablets (40 mg total) by mouth daily. Stop duloxetine 180 tablet 3   fluticasone (FLONASE) 50 MCG/ACT nasal spray USE 2 SPRAYS IN EACH NOSTRIL DAILY 48 g 3   Fluticasone-Umeclidin-Vilant (TRELEGY ELLIPTA) 100-62.5-25 MCG/ACT AEPB Inhale 1 puff into the lungs daily. 1 each 5   gabapentin (NEURONTIN) 100 MG capsule Take 1 capsule (100 mg total) by mouth 3 (three) times daily. 90 capsule 3   levothyroxine (SYNTHROID) 112 MCG tablet TAKE 1 TABLET DAILY (DISCONTINUE LEVOTHYROXINE 100) 90 tablet 3   losartan-hydrochlorothiazide (HYZAAR) 100-25 MG tablet TAKE 1 TABLET DAILY 90 tablet 3   meclizine (ANTIVERT) 25  MG tablet Take 1 tablet (25 mg total) by mouth 3 (three) times daily as needed for dizziness. 30 tablet 0   Multiple Vitamins-Minerals (EMERGEN-C FIVE PO) Take by mouth.     omeprazole (PRILOSEC) 20 MG capsule Take 1 capsule (20 mg total) by mouth daily. 90 capsule 3   OXYGEN 2lpm with sleep     rosuvastatin (CRESTOR) 20 MG tablet Take 1 tablet (20 mg total) by mouth daily. 90 tablet 3   temazepam (RESTORIL) 15 MG capsule Take 1 capsule (15 mg total) by mouth at bedtime as needed for sleep. 30 capsule 0   thiamine (VITAMIN B-1) 100 MG tablet Take 100 mg by mouth daily.     No current facility-administered medications on file prior to visit.    Allergies  Allergen Reactions   Sulfonamide Derivatives Hives   Amoxicillin Diarrhea    Diarrhea, doesnt tolerate    Doxycycline Nausea Only   Erythromycin Nausea Only   Social History   Socioeconomic History   Marital status: Married    Spouse name: Not on file   Number of children: 3   Years of education: Not on file   Highest education level: Not on file  Occupational History   Occupation: retired Optician, dispensing: RETIRED  Tobacco Use   Smoking status: Former    Packs/day: 0.75    Years: 30.00    Total pack years: 22.50    Types: Cigarettes    Quit date: 09/11/2004    Years since quitting: 17.7   Smokeless tobacco: Never  Vaping Use   Vaping Use: Never used  Substance and Sexual Activity   Alcohol use: Yes    Alcohol/week: 7.0 standard drinks of alcohol    Types: 7 Shots of liquor per week    Comment: 1-2 drinks nightly    Drug use: No   Sexual activity: Yes  Other Topics Concern   Not on file  Social History Narrative   Not on file   Social Determinants of Health   Financial Resource Strain: Low Risk  (02/17/2021)   Overall Financial Resource Strain (CARDIA)    Difficulty of Paying Living Expenses: Not hard at all  Food Insecurity: No Food Insecurity (02/17/2021)   Hunger Vital Sign    Worried About Running Out of  Food in the Last Year: Never true    Ran Out of Food in the Last Year: Never true  Transportation Needs: No Transportation Needs (02/17/2021)   PRAPARE - Hydrologist (Medical): No    Lack of Transportation (Non-Medical):  No  Physical Activity: Inactive (02/17/2021)   Exercise Vital Sign    Days of Exercise per Week: 0 days    Minutes of Exercise per Session: 0 min  Stress: No Stress Concern Present (02/17/2021)   [place]    Feeling of Stress : Not at all  Social Connections: Moderately Isolated (02/17/2021)   Social Connection and Isolation Panel [NHANES]    Frequency of Communication with Friends and Family: Twice a week    Frequency of Social Gatherings with Friends and Family: More than three times a week    Attends Religious Services: Never    Marine scientist or Organizations: No    Attends Archivist Meetings: Never    Marital Status: Married  Human resources officer Violence: Not At Risk (02/17/2021)   Humiliation, Afraid, Rape, and Kick questionnaire    Fear of Current or Ex-Partner: No    Emotionally Abused: No    Physically Abused: No    Sexually Abused: No      Review of Systems  All other systems reviewed and are negative.      Objective:   Physical Exam Vitals reviewed.  Constitutional:      General: She is not in acute distress.    Appearance: Normal appearance. She is normal weight. She is not ill-appearing or toxic-appearing.  Cardiovascular:     Rate and Rhythm: Normal rate and regular rhythm.     Heart sounds: Normal heart sounds.  Pulmonary:     Effort: Pulmonary effort is normal.     Breath sounds: Normal breath sounds. No wheezing, rhonchi or rales.  Abdominal:     General: Bowel sounds are normal.     Palpations: Abdomen is soft.  Musculoskeletal:     Right lower leg: No edema.     Left lower leg: No edema.  Neurological:     Mental Status: She  is alert.           Assessment & Plan:  Pulmonary emphysema, unspecified emphysema type ([place])  Attention deficit hyperactivity disorder (ADHD), predominantly inattentive type  Anxiety state  Benign essential HTN - Plan: CBC with Differential/Platelet, Lipid panel, COMPLETE METABOLIC PANEL WITH GFR  Hypothyroidism, unspecified type - Plan: TSH  Poor balance - Plan: Ambulatory referral to Physical Therapy I am extremely happy with her blood pressure today at 118/74.  I would like to check a CBC, CMP, and lipid panel.  Ideally would like her lipids to be below 100.  I will check TSH to ensure adequate dosage of her levothyroxine.  She received a flu shot today.  Her pneumonia and vaccines are up-to-date.  I did recommend COVID vaccination and RSV vaccination given her COPD.  At the present time her depression and anxiety seem to be well controlled.  Did recommend physical therapy due to her vertigo and falls and poor balance

## 2022-06-08 NOTE — Addendum Note (Signed)
Addended by: Randal Buba K on: 06/08/2022 12:39 PM   Modules accepted: Orders

## 2022-06-09 ENCOUNTER — Encounter: Payer: Self-pay | Admitting: Family Medicine

## 2022-06-09 LAB — CBC WITH DIFFERENTIAL/PLATELET
Absolute Monocytes: 927 cells/uL (ref 200–950)
Basophils Absolute: 54 cells/uL (ref 0–200)
Basophils Relative: 0.6 %
Eosinophils Absolute: 180 cells/uL (ref 15–500)
Eosinophils Relative: 2 %
HCT: 45.3 % — ABNORMAL HIGH (ref 35.0–45.0)
Hemoglobin: 15.4 g/dL (ref 11.7–15.5)
Lymphs Abs: 2034 cells/uL (ref 850–3900)
MCH: 33.6 pg — ABNORMAL HIGH (ref 27.0–33.0)
MCHC: 34 g/dL (ref 32.0–36.0)
MCV: 98.7 fL (ref 80.0–100.0)
MPV: 10.9 fL (ref 7.5–12.5)
Monocytes Relative: 10.3 %
Neutro Abs: 5805 cells/uL (ref 1500–7800)
Neutrophils Relative %: 64.5 %
Platelets: 318 10*3/uL (ref 140–400)
RBC: 4.59 10*6/uL (ref 3.80–5.10)
RDW: 12.9 % (ref 11.0–15.0)
Total Lymphocyte: 22.6 %
WBC: 9 10*3/uL (ref 3.8–10.8)

## 2022-06-09 LAB — LIPID PANEL
Cholesterol: 279 mg/dL — ABNORMAL HIGH (ref <200)
HDL: 55 mg/dL (ref >=50)
Non-HDL Cholesterol (Calc): 224 mg/dL (calc) — ABNORMAL HIGH (ref <130)
Total CHOL/HDL Ratio: 5.1 (calc) — ABNORMAL HIGH (ref <5.0)
Triglycerides: 474 mg/dL — ABNORMAL HIGH (ref <150)

## 2022-06-09 LAB — COMPLETE METABOLIC PANEL WITH GFR
AG Ratio: 1.8 (calc) (ref 1.0–2.5)
ALT: 20 U/L (ref 6–29)
AST: 22 U/L (ref 10–35)
Albumin: 4.4 g/dL (ref 3.6–5.1)
Alkaline phosphatase (APISO): 71 U/L (ref 37–153)
BUN/Creatinine Ratio: 15 (calc) (ref 6–22)
BUN: 15 mg/dL (ref 7–25)
CO2: 24 mmol/L (ref 20–32)
Calcium: 11.7 mg/dL — ABNORMAL HIGH (ref 8.6–10.4)
Chloride: 99 mmol/L (ref 98–110)
Creat: 0.99 mg/dL — ABNORMAL HIGH (ref 0.60–0.95)
Globulin: 2.4 g/dL (calc) (ref 1.9–3.7)
Glucose, Bld: 101 mg/dL — ABNORMAL HIGH (ref 65–99)
Potassium: 3.9 mmol/L (ref 3.5–5.3)
Sodium: 137 mmol/L (ref 135–146)
Total Bilirubin: 0.5 mg/dL (ref 0.2–1.2)
Total Protein: 6.8 g/dL (ref 6.1–8.1)
eGFR: 58 mL/min/{1.73_m2} — ABNORMAL LOW (ref >=60)

## 2022-06-09 LAB — TSH: TSH: 1.23 mIU/L (ref 0.40–4.50)

## 2022-06-15 ENCOUNTER — Other Ambulatory Visit: Payer: Medicare Other

## 2022-06-15 DIAGNOSIS — E039 Hypothyroidism, unspecified: Secondary | ICD-10-CM | POA: Diagnosis not present

## 2022-06-16 LAB — COMPREHENSIVE METABOLIC PANEL
AG Ratio: 1.8 (calc) (ref 1.0–2.5)
ALT: 20 U/L (ref 6–29)
AST: 19 U/L (ref 10–35)
Albumin: 4.2 g/dL (ref 3.6–5.1)
Alkaline phosphatase (APISO): 62 U/L (ref 37–153)
BUN/Creatinine Ratio: 16 (calc) (ref 6–22)
BUN: 15 mg/dL (ref 7–25)
CO2: 26 mmol/L (ref 20–32)
Calcium: 10.5 mg/dL — ABNORMAL HIGH (ref 8.6–10.4)
Chloride: 99 mmol/L (ref 98–110)
Creat: 0.96 mg/dL — ABNORMAL HIGH (ref 0.60–0.95)
Globulin: 2.4 g/dL (calc) (ref 1.9–3.7)
Glucose, Bld: 89 mg/dL (ref 65–99)
Potassium: 4 mmol/L (ref 3.5–5.3)
Sodium: 138 mmol/L (ref 135–146)
Total Bilirubin: 0.5 mg/dL (ref 0.2–1.2)
Total Protein: 6.6 g/dL (ref 6.1–8.1)

## 2022-06-16 LAB — TSH: TSH: 2.34 mIU/L (ref 0.40–4.50)

## 2022-06-20 ENCOUNTER — Other Ambulatory Visit: Payer: Self-pay | Admitting: Family Medicine

## 2022-06-22 MED ORDER — TEMAZEPAM 15 MG PO CAPS: 15.0000 mg | ORAL_CAPSULE | Freq: Every evening | ORAL | 0 refills | Status: DC | PRN

## 2022-06-26 ENCOUNTER — Other Ambulatory Visit: Payer: Self-pay | Admitting: Family Medicine

## 2022-07-05 ENCOUNTER — Telehealth: Payer: Self-pay

## 2022-07-05 NOTE — Telephone Encounter (Signed)
Pt called in wanting to have a referral sent [facility]. Pt provided these numbers to contact [phone]/fax [phone]. Please advise  Cb#: [phone]

## 2022-07-06 ENCOUNTER — Encounter: Payer: Self-pay | Admitting: Family Medicine

## 2022-07-06 ENCOUNTER — Ambulatory Visit (INDEPENDENT_AMBULATORY_CARE_PROVIDER_SITE_OTHER): Payer: Medicare Other | Admitting: Family Medicine

## 2022-07-06 ENCOUNTER — Other Ambulatory Visit: Payer: Self-pay

## 2022-07-06 ENCOUNTER — Ambulatory Visit
Admission: RE | Admit: 2022-07-06 | Discharge: 2022-07-06 | Disposition: A | Discharge status: Home / Self Care | Payer: Medicare Other | Source: Ambulatory Visit | Attending: Family Medicine | Admitting: Family Medicine

## 2022-07-06 VITALS — BP 120/62 | HR 82 | Ht 66.0 in | Wt 195.0 lb

## 2022-07-06 DIAGNOSIS — R2689 Other abnormalities of gait and mobility: Secondary | ICD-10-CM

## 2022-07-06 DIAGNOSIS — R0602 Shortness of breath: Secondary | ICD-10-CM | POA: Diagnosis not present

## 2022-07-06 DIAGNOSIS — R0609 Other forms of dyspnea: Secondary | ICD-10-CM

## 2022-07-06 DIAGNOSIS — R0600 Dyspnea, unspecified: Secondary | ICD-10-CM | POA: Diagnosis not present

## 2022-07-06 NOTE — Progress Notes (Signed)
Subjective:    Patient ID: [name], female    DOB: May 12, 1941, 81 y.o.   MRN: [id number] 06/08/22 Patient presents today for follow-up of chronic medical conditions.  Patient has a remote history of lung cancer status post left upper lobe resection in 2011 for non-small cell lung cancer.  She has been cancer free ever since.  She has a history of COPD for which she is on Trelegy and continues to report dyspnea on exertion.  She had a negative cardiac work-up last year.  Dyspnea on exertion is thought to be secondary to deconditioning, age, and COPD.  She also has a history of depression and anxiety and ADD.  However she seems to be doing quite well.  She does report that she is fallen recently and slipped on the floor.  She reports feeling dizzy and having vertigo when she turns her head to the side.  These reasons make her feel unsteady on her feet.  She feels like she is at a higher fall risk.  She denies any chest pain or nausea or vomiting or diarrhea.  She denies any neuropathy in her feet or leg swelling.  Today her lungs are clear to auscultation bilaterally without any wheezes crackles or rails.  At that time, my plan was:  I am extremely happy with her blood pressure today at 118/74.  I would like to check a CBC, CMP, and lipid panel.  Ideally would like her lipids to be below 100.  I will check TSH to ensure adequate dosage of her levothyroxine.  She received a flu shot today.  Her pneumonia and vaccines are up-to-date.  I did recommend COVID vaccination and RSV vaccination given her COPD.  At the present time her depression and anxiety seem to be well controlled.  Did recommend physical therapy due to her vertigo and falls and poor balance  07/06/22 Patient is here today with her caregiver.  She continues to fall.  She states that her legs just give out that she loses her balance and she falls.  Recently her caregiver found her in the floor 1 morning and the patient has no  recollection of how she got there.  She denies any seizure activity.  She denies any syncope.  She denies any chest pain she does report shortness of breath with activity.  She is demonstrating increased work of breathing in the office today.  On exam her lungs are clear.  There are no wheezes crackles or rails.  She has trace bipedal edema.  Her heart is in normal sinus rhythm however she states that she feels short of breath with activity. Past Medical History:  Diagnosis Date   ADD (attention deficit disorder)    Allergic rhinitis    Anemia    Anxiety    Arrhythmia    Arthritis    "hands, neck, right shoulder" (07/28/2015)   Aseptic necrosis ([place])    Asthma dxc'd 05/2015   Basal cell cancer    Bleeding stomach ulcer ~ 06/2005   COPD (chronic obstructive pulmonary disease) ([facility])    Depression    Diverticula of colon    Dyspnea    Esophagitis    Gastric ulcer with hemorrhage    GERD (gastroesophageal reflux disease)    Heart murmur    History of blood transfusion 06/2005   "related to pneumonia"   History of kidney stones    Hyperlipidemia    Hypertension    Hypothyroidism    Kidney stones  Lung cancer ([place])    Non small cell lung cancer, adenocarcinoma with BAC 09/2009, VATS converted to wedge resection of left upper lobe mass   Mitral valve prolapse    "no ORs" (07/28/2015)   Osteopenia    Pneumonia 1970's; 06/2005   Sleep apnea dx'd 06/2015   on oxygen at nite at 2L/[place]    Squamous cell skin cancer    Past Surgical History:  Procedure Laterality Date   ABDOMINAL HYSTERECTOMY  1988   COLONOSCOPY WITH PROPOFOL N/A 06/15/2017   Procedure: COLONOSCOPY WITH PROPOFOL;  Surgeon: Carol Ada, MD;  Location: [facility];  Service: Endoscopy;  Laterality: N/A;   HAMMER TOE SURGERY Right ~ 2010   SALPINGOOPHORECTOMY Left 1988   SKIN CANCER EXCISION     "I've had severl cut off RLE; left foreqrm, nose under nose"   THORACOTOMY Left 06/2005   THORACOTOMY/LOBECTOMY  09/2009;  11/2009   left; right   Current Outpatient Medications on File Prior to Visit  Medication Sig Dispense Refill   acetaminophen (TYLENOL) 325 MG tablet Take by mouth.     albuterol (VENTOLIN HFA) 108 (90 Base) MCG/ACT inhaler Inhale 1 puff into the lungs every 6 (six) hours as needed for wheezing or shortness of breath.     Calcium Carbonate-Vit D-Min (CALCIUM 1200 PO) Take 50 mg by mouth. ONCE A WEEK     co-enzyme Q-10 30 MG capsule Take 100 mg by mouth 3 (three) times daily.     Cyanocobalamin 1000 MCG CAPS Take by mouth.     doxazosin (CARDURA) 4 MG tablet TAKE 1 TABLET DAILY 90 tablet 3   FLUoxetine (PROZAC) 20 MG tablet Take 2 tablets (40 mg total) by mouth daily. Stop duloxetine 180 tablet 3   fluticasone (FLONASE) 50 MCG/ACT nasal spray USE 2 SPRAYS IN EACH NOSTRIL DAILY 48 g 3   Fluticasone-Umeclidin-Vilant (TRELEGY ELLIPTA) 100-62.5-25 MCG/ACT AEPB Inhale 1 puff into the lungs daily. 1 each 5   gabapentin (NEURONTIN) 100 MG capsule Take 1 capsule (100 mg total) by mouth 3 (three) times daily. 90 capsule 3   levothyroxine (SYNTHROID) 112 MCG tablet TAKE 1 TABLET DAILY (DISCONTINUE LEVOTHYROXINE 100) 90 tablet 3   losartan-hydrochlorothiazide (HYZAAR) 100-25 MG tablet TAKE 1 TABLET DAILY 90 tablet 3   meclizine (ANTIVERT) 25 MG tablet TAKE 1 TABLET BY MOUTH 3 TIMES DAILY AS NEEDED FOR DIZZINESS. 30 tablet 0   Multiple Vitamins-Minerals (EMERGEN-C FIVE PO) Take by mouth.     omeprazole (PRILOSEC) 20 MG capsule Take 1 capsule (20 mg total) by mouth daily. 90 capsule 3   OXYGEN 2lpm with sleep     rosuvastatin (CRESTOR) 20 MG tablet Take 1 tablet (20 mg total) by mouth daily. 90 tablet 3   temazepam (RESTORIL) 15 MG capsule Take 1 capsule (15 mg total) by mouth at bedtime as needed for sleep. 30 capsule 0   thiamine (VITAMIN B-1) 100 MG tablet Take 100 mg by mouth daily.     No current facility-administered medications on file prior to visit.    Allergies  Allergen Reactions    Sulfonamide Derivatives Hives   Amoxicillin Diarrhea    Diarrhea, doesnt tolerate    Doxycycline Nausea Only   Erythromycin Nausea Only   Social History   Socioeconomic History   Marital status: Married    Spouse name: Not on file   Number of children: 3   Years of education: Not on file   Highest education level: Not on file  Occupational History  Occupation: retired Optician, dispensing: RETIRED  Tobacco Use   Smoking status: Former    Packs/day: 0.75    Years: 30.00    Total pack years: 22.50    Types: Cigarettes    Quit date: 09/11/2004    Years since quitting: 17.8   Smokeless tobacco: Never  Vaping Use   Vaping Use: Never used  Substance and Sexual Activity   Alcohol use: Yes    Alcohol/week: 7.0 standard drinks of alcohol    Types: 7 Shots of liquor per week    Comment: 1-2 drinks nightly    Drug use: No   Sexual activity: Yes  Other Topics Concern   Not on file  Social History Narrative   Not on file   Social Determinants of Health   Financial Resource Strain: Low Risk  (02/17/2021)   Overall Financial Resource Strain (CARDIA)    Difficulty of Paying Living Expenses: Not hard at all  Food Insecurity: No Food Insecurity (02/17/2021)   Hunger Vital Sign    Worried About Running Out of Food in the Last Year: Never true    Ran Out of Food in the Last Year: Never true  Transportation Needs: No Transportation Needs (02/17/2021)   PRAPARE - Hydrologist (Medical): No    Lack of Transportation (Non-Medical): No  Physical Activity: Inactive (02/17/2021)   Exercise Vital Sign    Days of Exercise per Week: 0 days    Minutes of Exercise per Session: 0 min  Stress: No Stress Concern Present (02/17/2021)   [place]    Feeling of Stress : Not at all  Social Connections: Moderately Isolated (02/17/2021)   Social Connection and Isolation Panel [NHANES]    Frequency of Communication  with Friends and Family: Twice a week    Frequency of Social Gatherings with Friends and Family: More than three times a week    Attends Religious Services: Never    Marine scientist or Organizations: No    Attends Archivist Meetings: Never    Marital Status: Married  Human resources officer Violence: Not At Risk (02/17/2021)   Humiliation, Afraid, Rape, and Kick questionnaire    Fear of Current or Ex-Partner: No    Emotionally Abused: No    Physically Abused: No    Sexually Abused: No      Review of Systems  All other systems reviewed and are negative.      Objective:   Physical Exam Vitals reviewed.  Constitutional:      General: She is not in acute distress.    Appearance: Normal appearance. She is normal weight. She is not ill-appearing or toxic-appearing.  Cardiovascular:     Rate and Rhythm: Normal rate and regular rhythm.     Heart sounds: Normal heart sounds.  Pulmonary:     Effort: Pulmonary effort is normal.     Breath sounds: Normal breath sounds. No wheezing, rhonchi or rales.  Abdominal:     General: Bowel sounds are normal.     Palpations: Abdomen is soft.  Musculoskeletal:     Right lower leg: No edema.     Left lower leg: No edema.  Neurological:     Mental Status: She is alert.           Assessment & Plan:  Shortness of breath - Plan: CBC with Differential/Platelet, Brain natriuretic peptide, D-dimer, quantitative, COMPLETE METABOLIC PANEL WITH GFR, DG Chest  2 View  Poor balance I walked with the patient in the hallway using her walker.  There is no foot drop or neurologic deficit however she does stand up quickly and walk fast and I believe that predisposes her to falling and tripping.  I believe that she would benefit dramatically from physical therapy however I am extremely concerned about her shortness of breath.  I will check a CBC and a BNP and D-dimer as well as a chest x-ray.  Once we have a plan in place about treating her  shortness of breath, we will pursue further physical therapy but at the present time her wounds of delineate whether this is a pulmonary issue or cardiac issue

## 2022-07-07 LAB — COMPLETE METABOLIC PANEL WITH GFR
AG Ratio: 1.8 (calc) (ref 1.0–2.5)
ALT: 15 U/L (ref 6–29)
AST: 21 U/L (ref 10–35)
Albumin: 4.2 g/dL (ref 3.6–5.1)
Alkaline phosphatase (APISO): 56 U/L (ref 37–153)
BUN/Creatinine Ratio: 13 (calc) (ref 6–22)
BUN: 17 mg/dL (ref 7–25)
CO2: 21 mmol/L (ref 20–32)
Calcium: 10.2 mg/dL (ref 8.6–10.4)
Chloride: 101 mmol/L (ref 98–110)
Creat: 1.33 mg/dL — ABNORMAL HIGH (ref 0.60–0.95)
Globulin: 2.4 g/dL (calc) (ref 1.9–3.7)
Glucose, Bld: 104 mg/dL — ABNORMAL HIGH (ref 65–99)
Potassium: 4.3 mmol/L (ref 3.5–5.3)
Sodium: 137 mmol/L (ref 135–146)
Total Bilirubin: 0.5 mg/dL (ref 0.2–1.2)
Total Protein: 6.6 g/dL (ref 6.1–8.1)
eGFR: 40 mL/min/{1.73_m2} — ABNORMAL LOW (ref >=60)

## 2022-07-07 LAB — CBC WITH DIFFERENTIAL/PLATELET
Absolute Monocytes: 774 cells/uL (ref 200–950)
Basophils Absolute: 68 cells/uL (ref 0–200)
Basophils Relative: 0.8 %
Eosinophils Absolute: 119 cells/uL (ref 15–500)
Eosinophils Relative: 1.4 %
HCT: 41.2 % (ref 35.0–45.0)
Hemoglobin: 14.3 g/dL (ref 11.7–15.5)
Lymphs Abs: 1573 cells/uL (ref 850–3900)
MCH: 33.7 pg — ABNORMAL HIGH (ref 27.0–33.0)
MCHC: 34.7 g/dL (ref 32.0–36.0)
MCV: 97.2 fL (ref 80.0–100.0)
MPV: 11.1 fL (ref 7.5–12.5)
Monocytes Relative: 9.1 %
Neutro Abs: 5967 cells/uL (ref 1500–7800)
Neutrophils Relative %: 70.2 %
Platelets: 283 10*3/uL (ref 140–400)
RBC: 4.24 10*6/uL (ref 3.80–5.10)
RDW: 12.4 % (ref 11.0–15.0)
Total Lymphocyte: 18.5 %
WBC: 8.5 10*3/uL (ref 3.8–10.8)

## 2022-07-07 LAB — D-DIMER, QUANTITATIVE: D-Dimer, Quant: 0.54 mcg/mL FEU — ABNORMAL HIGH (ref <0.50)

## 2022-07-07 LAB — BRAIN NATRIURETIC PEPTIDE: Brain Natriuretic Peptide: 30 pg/mL (ref <100)

## 2022-07-10 ENCOUNTER — Encounter: Payer: Self-pay | Admitting: Family Medicine

## 2022-07-11 ENCOUNTER — Other Ambulatory Visit: Payer: Self-pay

## 2022-07-11 ENCOUNTER — Telehealth: Payer: Self-pay

## 2022-07-11 DIAGNOSIS — E7800 Pure hypercholesterolemia, unspecified: Secondary | ICD-10-CM

## 2022-07-11 MED ORDER — ROSUVASTATIN CALCIUM 20 MG PO TABS: 20.0000 mg | ORAL_TABLET | Freq: Every day | ORAL | 3 refills | Status: DC

## 2022-07-11 NOTE — Telephone Encounter (Signed)
MY CHART MESSAGE FROM PATIENT:  I fell again Friday night.  I am using my portable 02 during the day and the regular oxygen at night.  I am more concerned that falling and NOT remembering how or the details is worrying me.  I'd like to see a neurologist- not one of the men at [facility]- or of a different culture or a female.  I probably need a CT scan.  I am making an appt with Dr Melvyn Novas and with the physical therapist.

## 2022-07-27 ENCOUNTER — Telehealth: Payer: Self-pay | Admitting: Family Medicine

## 2022-07-27 NOTE — Telephone Encounter (Signed)
Tried calling patient to schedule Medicare Annual Wellness Visit (AWV) either virtually or in office.   No answer    Last AWV  02/17/21 please schedule with Nurse Health Adviser   45 min for awv-i and in office appointments 30 min for awv-s  phone/virtual appointments

## 2022-07-28 ENCOUNTER — Ambulatory Visit (INDEPENDENT_AMBULATORY_CARE_PROVIDER_SITE_OTHER): Payer: Medicare Other | Admitting: Internal Medicine

## 2022-07-28 ENCOUNTER — Encounter: Payer: Self-pay | Admitting: Internal Medicine

## 2022-07-28 VITALS — BP 124/72 | HR 74 | Temp 97.8°F | Ht 66.5 in | Wt 196.6 lb

## 2022-07-28 DIAGNOSIS — J449 Chronic obstructive pulmonary disease, unspecified: Secondary | ICD-10-CM

## 2022-07-28 DIAGNOSIS — J9611 Chronic respiratory failure with hypoxia: Secondary | ICD-10-CM

## 2022-07-28 NOTE — Patient Instructions (Addendum)
Make sure you check your oxygen saturation  AT  your highest level of activity (not after you stop)   to be sure it stays over 90% and adjust  02 flow upward to maintain this level if needed but remember to turn it back to previous settings when you stop (to conserve your supply).    Also  Ok to try albuterol 15 min before an activity (on alternating days)  that you know would usually make you short of breath and see if it makes any difference and if makes none then don't take albuterol after activity unless you can't catch your breath as this means it's the resting that helps, not the albuterol.     Please schedule a follow up visit in 12 months but call sooner if needed

## 2022-07-28 NOTE — Progress Notes (Signed)
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 She has seen [name] in the past- last in 2012. She has been seeing [name] at [facility] and is here today to re-est care. She c/o DOE with cleaning her house or taking out the trash. She is on O2 with sleep at 2lpm. She uses proiar 1 x per wk on average and has a neb with albuterol that she rarely uses.   doe x [facility] = can't walk a nl pace on a flat grade s sob but does fine slow and flat eg shopping at slow pace / can't do ramps Doe Never changed since empyema despite having RT and chemo for BAC dx 09/14/09 at [facility] with [facility] 11/2009 and persistent bilateral MPN's under surveillance at [facility] but no recent bx's or further chemo.  Uses  advair 250 twice daily ? Benefit > saba doesn't really help any of her symptoms/ no signifcant chronic or assoc  Coughing. rec Plan A = Automatic = bevespi Take 2 puffs first thing in am and then another 2 puffs about 12 hours later.  Work on inhaler technique: Plan B = Backup Only use your albuterol as a rescue medication  Plan C = Crisis - only use your albuterol nebulizer if you first try Plan B and it fails to help > ok to use the nebulizer up to every 4 hours but if start needing it regularly call for immediate appointment   07/06/2020  f/u ov/[name] re: GOLD II / 02 2lpm hs and trelegy / not monitoring sats as rec Chief Complaint  Patient presents  with   Follow-up    1 year rov copd.  needs O2 requalification- wears qhs.    Dyspnea:  mb and back and uses saba freq  Cough: none  Sleeping: bed is flat / 2 pillows/ SABA use: twice daily usually after over does it (opposite the instructions given) 02: 2lpm hs  - not checking sats with ex  Rec Continue Trelegy each am and 0xygen 2lpm at bedtime but you do not need a portable system at this point Only use your albuterol as a rescue medication Try albuterol 15 min before an activity (on alternate days)  that you know would make you short of breat    07/28/2022  f/u ov/[name] re: GOLD 2 copd/ 02 3lpm hs/ POC during the day maint on trelegy   Chief Complaint  Patient presents with   Follow-up    Dyspnea and SOB worse.  Oxygen qhs and prn during day.   Dyspnea:  room to room no longer checking sats  Cough: slt greenish/ assoc watery rhinitis  Sleeping: flat bed 2 pillows no resp cc  SABA use: twice a week  02: not needing sitting/ 3lpm walking     No obvious day to day or daytime variability  or assoc  mucus plugs or hemoptysis or cp or chest tightness, subjective wheeze or overt sinus or hb symptoms.   Sleeping as above without nocturnal  or early am exacerbation  of respiratory  c/o's or need for noct saba. Also denies any obvious fluctuation of symptoms with weather or environmental changes or other aggravating or alleviating factors except as outlined above   No unusual exposure hx or h/o childhood pna/ asthma or knowledge of premature birth.  Current Allergies, Complete Past Medical History, Past Surgical History, Family History, and Social History were reviewed in [employer] record.  ROS  The following are not active complaints unless bolded Hoarseness, sore throat, dysphagia, dental problems, itching, sneezing,  nasal congestion or discharge of excess mucus or purulent secretions, ear ache,   fever, chills, sweats, unintended wt loss or wt gain,  classically pleuritic or exertional cp,  orthopnea pnd or arm/hand swelling  or leg swelling, presyncope, palpitations, abdominal pain, anorexia, nausea, vomiting, diarrhea  or change in bowel habits or change in bladder habits, change in stools or change in urine, dysuria, hematuria,  rash, arthralgias, visual complaints, headache, numbness, weakness or ataxia or problems with walking or coordination > freq falls  change in mood or  memory.        Current Meds  Medication Sig   acetaminophen (TYLENOL) 325 MG tablet Take by mouth.   albuterol (VENTOLIN HFA) 108 (90 Base) MCG/ACT inhaler Inhale 1 puff into the lungs every 6 (six) hours as needed for wheezing or shortness of breath.   co-enzyme Q-10 30 MG capsule Take 100 mg by mouth 3 (three) times daily.   Cyanocobalamin 1000 MCG CAPS Take by mouth.   doxazosin (CARDURA) 4 MG tablet TAKE 1 TABLET DAILY   FLUoxetine (PROZAC) 20 MG tablet Take 2 tablets (40 mg total) by mouth daily. Stop duloxetine   fluticasone (FLONASE) 50 MCG/ACT nasal spray USE 2 SPRAYS IN EACH NOSTRIL DAILY   Fluticasone-Umeclidin-Vilant (TRELEGY ELLIPTA) 100-62.5-25 MCG/ACT AEPB Inhale 1 puff into the lungs daily.   levothyroxine (SYNTHROID) 112 MCG tablet TAKE 1 TABLET DAILY (DISCONTINUE LEVOTHYROXINE 100)   losartan-hydrochlorothiazide (HYZAAR) 100-25 MG tablet TAKE 1 TABLET DAILY   meclizine (ANTIVERT) 25 MG tablet TAKE 1 TABLET BY MOUTH 3 TIMES DAILY AS NEEDED FOR DIZZINESS.   OXYGEN 2lpm with sleep   rosuvastatin (CRESTOR) 20 MG tablet Take 1 tablet (20 mg total) by mouth daily.   temazepam (RESTORIL) 15 MG capsule Take 1 capsule (15 mg total) by mouth at bedtime as needed for sleep.                               Objective:   Physical Exam    07/28/2022        196  07/06/2020     183  04/09/2019       173  03/10/2019       179 02/05/2019       179  07/19/2018       173  06/19/2018       175  08/10/2017     170   06/29/17 166 lb (75.3 kg)  05/28/17  164 lb (74.4 kg)  05/03/17 166 lb (75.3 kg)    Vital signs reviewed  07/28/2022  - Note at rest 02 sats  97% on RA   General appearance:    chronically ill wf using rolling walker  HEENT : Oropharynx  clear  NECK :  without  apparent JVD/ palpable Nodes/TM    LUNGS: no acc muscle use,  Mild barrel  contour chest wall with bilateral  Distant bs s audible wheeze and  without cough on insp or exp maneuvers  and mild  Hyperresonant  to  percussion bilaterally     CV:  RRR  no s3 or murmur or increase in P2, and no edema   ABD:  soft and nontender with pos end  insp Hoover's  in the supine position.  No bruits or organomegaly appreciated   MS:   ext warm without deformities Or obvious joint restrictions  calf tenderness, cyanosis or clubbing     SKIN: warm and dry without lesions    NEURO:  alert, approp, nl sensorium with  no motor or cerebellar deficits apparent.         I personally reviewed images and agree with radiology impression as follows:  CXR:   Pa and lateral  07/06/22 Chronic changes/treatment changes, without evidence of acute cardiopulmonary disease     Assessment:

## 2022-07-28 NOTE — Assessment & Plan Note (Signed)
Quit smoking 2006 PFT's  08/07/11  FEV1 2.14 (89 % ) ratio 69  with DLCO  73 % corrects to 108 % for alv volume  - Spirometry 06/29/2017  FEV1 1.24 (52%)  Ratio 64 on advair -  Allergy profile 06/29/17  >  Eos 0.2/  IgE  13 RAST neg  - 06/29/2017  try bevespi  2bid > improved as of 08/10/18  -  Spirometry 06/19/2018  FEV1 1.4 (57%)  Ratio 64 p bevespi x 2 pffs   - PFT's  07/19/2018  FEV1 1.63  (74 % ) ratio 68  p 13 % improvement from saba p nothing prior to study with DLCO  71/70c % corrects to 93 % for alv volume    - 02/05/2019   continue bevespi  - 03/2019 changed to trelegy due to insurance    Group D (now reclassified as E) in terms of symptom/risk and laba/lama/ICS  therefore appropriate rx at this point >>>  trelegy and approp saba  Re SABA :  I spent extra time with pt today reviewing appropriate use of albuterol for prn use on exertion with the following points: 1) saba is for relief of sob that does not improve by walking a slower pace or resting but rather if the pt does not improve after trying this first. 2) If the pt is convinced, as many are, that saba helps recover from activity faster then it's easy to tell if this is the case by re-challenging : ie stop, take the inhaler, then p 5 minutes try the exact same activity (intensity of workload) that just caused the symptoms and see if they are substantially diminished or not after saba 3) if there is an activity that reproducibly causes the symptoms, try the saba 15 min before the activity on alternate days   If in fact the saba really does help, then fine to continue to use it prn but advised may need to look closer at the maintenance regimen being used to achieve better control of airways disease with exertion.

## 2022-07-28 NOTE — Assessment & Plan Note (Signed)
See care everywhere pulmonary notes from 2016/ [facility] > rx 2lpm hs  - 06/19/2018  Walked RA x 3 laps @ 185 ft each stopped due to  End of study, moderately fast pace, no desat but sob at end    - 04/09/2019   [facility] RA  2 laps @  approx 244ft each @ moderate pace  stopped due to  Sob at end but sats still 91%  -  07/06/2020   Walked RA  approx   750 ft  @ moderate pace /awkward gait due to back pain  stopped due to  [employer] with sats still 90%        Adequate control on present rx, reviewed in detail with pt > no change in rx needed  = 3lpm hs and prn to keep sata > 90%  Advised Make sure you check your oxygen saturation  AT  your highest level of activity (not after you stop)   to be sure it stays over 90% and adjust  02 flow upward to maintain this level if needed but remember to turn it back to previous settings when you stop (to conserve your supply).          Each maintenance medication was reviewed in detail including emphasizing most importantly the difference between maintenance and prns and under what circumstances the prns are to be triggered using an action plan format where appropriate.  Total time for H and P, chart review, counseling, reviewing dpi, hfa/neb/02 device(s) and generating customized AVS unique to this office visit / same day charting = 28 min

## 2022-08-14 ENCOUNTER — Telehealth: Payer: Self-pay | Admitting: Internal Medicine

## 2022-08-16 ENCOUNTER — Other Ambulatory Visit: Payer: Self-pay | Admitting: Family Medicine

## 2022-08-16 DIAGNOSIS — Z1231 Encounter for screening mammogram for malignant neoplasm of breast: Secondary | ICD-10-CM

## 2022-08-16 NOTE — Telephone Encounter (Signed)
ATC. LVMTCB.

## 2022-08-16 NOTE — Telephone Encounter (Signed)
Fine with me

## 2022-08-16 NOTE — Telephone Encounter (Signed)
Spoke with Carlyon Prows (pt's daughter per [facility]) who is requesting pt change doctors from Dr. Melvyn Novas to Dr. Annamaria Boots. Dr. Melvyn Novas and Dr. Annamaria Boots please advise.

## 2022-08-16 NOTE — Telephone Encounter (Signed)
ATC phone rang this disconnected. Busy signal when return call attempted

## 2022-08-17 ENCOUNTER — Ambulatory Visit (INDEPENDENT_AMBULATORY_CARE_PROVIDER_SITE_OTHER): Payer: Medicare Other

## 2022-08-17 VITALS — Ht 66.0 in | Wt 196.0 lb

## 2022-08-17 DIAGNOSIS — Z0000 Encounter for general adult medical examination without abnormal findings: Secondary | ICD-10-CM

## 2022-08-17 NOTE — Telephone Encounter (Signed)
I can see her

## 2022-08-17 NOTE — Patient Instructions (Addendum)
[name] , Thank you for taking time to come for your Medicare Wellness Visit. I appreciate your ongoing commitment to your health goals. Please review the following plan we discussed and let me know if I can assist you in the future.   These are the goals we discussed:  Goals      DIET - EAT MORE FRUITS AND VEGETABLES     Manage My Cholesterol     Timeframe:  Long-Range Goal Priority:  High Start Date:                             Expected End Date:                       Follow Up Date 08/19/2021    - eat smaller or less servings of red meat - fill half the plate with nonstarchy vegetables    Why is this important?   Changing cholesterol starts with eating heart-healthy foods.  Other steps may be to increase your activity and to quit if you smoke.    Notes:      Prevent falls        This is a list of the screening recommended for you and due dates:  Health Maintenance  Topic Date Due   COVID-19 Vaccine (5 - 2023-24 season) 08/14/2023*   DTaP/Tdap/Td vaccine (4 - Td or Tdap) 12/22/2022   Pneumonia Vaccine  Completed   Flu Shot  Completed   DEXA scan (bone density measurement)  Completed   Zoster (Shingles) Vaccine  Completed   HPV Vaccine  Aged Out  *Topic was postponed. The date shown is not the original due date.    Advanced directives: We have a copy of your advanced directives in your chart  Conditions/risks identified: Aim for 30 minutes of exercise or brisk walking, 6-8 glasses of water, and 5 servings of fruits and vegetables each day.  Next appointment: Follow up in one year for your annual wellness visit    Preventive Care 65 Years and Older, Female Preventive care refers to lifestyle choices and visits with your health care provider that can promote health and wellness. What does preventive care include? A yearly physical exam. This is also called an annual well check. Dental exams once or twice a year. Routine eye exams. Ask your health care provider  how often you should have your eyes checked. Personal lifestyle choices, including: Daily care of your teeth and gums. Regular physical activity. Eating a healthy diet. Avoiding tobacco and drug use. Limiting alcohol use. Practicing safe sex. Taking low-dose aspirin every day. Taking vitamin and mineral supplements as recommended by your health care provider. What happens during an annual well check? The services and screenings done by your health care provider during your annual well check will depend on your age, overall health, lifestyle risk factors, and family history of disease. Counseling  Your health care provider may ask you questions about your: Alcohol use. Tobacco use. Drug use. Emotional well-being. Home and relationship well-being. Sexual activity. Eating habits. History of falls. Memory and ability to understand (cognition). Work and work Statistician. Reproductive health. Screening  You may have the following tests or measurements: Height, weight, and BMI. Blood pressure. Lipid and cholesterol levels. These may be checked every 5 years, or more frequently if you are over 24 years old. Skin check. Lung cancer screening. You may have this screening every year starting at age  55 if you have a 30-pack-year history of smoking and currently smoke or have quit within the past 15 years. Fecal occult blood test (FOBT) of the stool. You may have this test every year starting at age 78. Flexible sigmoidoscopy or colonoscopy. You may have a sigmoidoscopy every 5 years or a colonoscopy every 10 years starting at age 63. Hepatitis C blood test. Hepatitis B blood test. Sexually transmitted disease (STD) testing. Diabetes screening. This is done by checking your blood sugar (glucose) after you have not eaten for a while (fasting). You may have this done every 1-3 years. Bone density scan. This is done to screen for osteoporosis. You may have this done starting at age  33. Mammogram. This may be done every 1-2 years. Talk to your health care provider about how often you should have regular mammograms. Talk with your health care provider about your test results, treatment options, and if necessary, the need for more tests. Vaccines  Your health care provider may recommend certain vaccines, such as: Influenza vaccine. This is recommended every year. Tetanus, diphtheria, and acellular pertussis (Tdap, Td) vaccine. You may need a Td booster every 10 years. Zoster vaccine. You may need this after age 73. Pneumococcal 13-valent conjugate (PCV13) vaccine. One dose is recommended after age 44. Pneumococcal polysaccharide (PPSV23) vaccine. One dose is recommended after age 22. Talk to your health care provider about which screenings and vaccines you need and how often you need them. This information is not intended to replace advice given to you by your health care provider. Make sure you discuss any questions you have with your health care provider. Document Released: 09/24/2015 Document Revised: 05/17/2016 Document Reviewed: 06/29/2015 Elsevier Interactive Patient Education  2017 [place] Prevention in the Home Falls can cause injuries. They can happen to people of all ages. There are many things you can do to make your home safe and to help prevent falls. What can I do on the outside of my home? Regularly fix the edges of walkways and driveways and fix any cracks. Remove anything that might make you trip as you walk through a door, such as a raised step or threshold. Trim any bushes or trees on the path to your home. Use bright outdoor lighting. Clear any walking paths of anything that might make someone trip, such as rocks or tools. Regularly check to see if handrails are loose or broken. Make sure that both sides of any steps have handrails. Any raised decks and porches should have guardrails on the edges. Have any leaves, snow, or ice cleared  regularly. Use sand or salt on walking paths during winter. Clean up any spills in your garage right away. This includes oil or grease spills. What can I do in the bathroom? Use night lights. Install grab bars by the toilet and in the tub and shower. Do not use towel bars as grab bars. Use non-skid mats or decals in the tub or shower. If you need to sit down in the shower, use a plastic, non-slip stool. Keep the floor dry. Clean up any water that spills on the floor as soon as it happens. Remove soap buildup in the tub or shower regularly. Attach bath mats securely with double-sided non-slip rug tape. Do not have throw rugs and other things on the floor that can make you trip. What can I do in the bedroom? Use night lights. Make sure that you have a light by your bed that is easy to reach.  Do not use any sheets or blankets that are too big for your bed. They should not hang down onto the floor. Have a firm chair that has side arms. You can use this for support while you get dressed. Do not have throw rugs and other things on the floor that can make you trip. What can I do in the kitchen? Clean up any spills right away. Avoid walking on wet floors. Keep items that you use a lot in easy-to-reach places. If you need to reach something above you, use a strong step stool that has a grab bar. Keep electrical cords out of the way. Do not use floor polish or wax that makes floors slippery. If you must use wax, use non-skid floor wax. Do not have throw rugs and other things on the floor that can make you trip. What can I do with my stairs? Do not leave any items on the stairs. Make sure that there are handrails on both sides of the stairs and use them. Fix handrails that are broken or loose. Make sure that handrails are as long as the stairways. Check any carpeting to make sure that it is firmly attached to the stairs. Fix any carpet that is loose or worn. Avoid having throw rugs at the top or  bottom of the stairs. If you do have throw rugs, attach them to the floor with carpet tape. Make sure that you have a light switch at the top of the stairs and the bottom of the stairs. If you do not have them, ask someone to add them for you. What else can I do to help prevent falls? Wear shoes that: Do not have high heels. Have rubber bottoms. Are comfortable and fit you well. Are closed at the toe. Do not wear sandals. If you use a stepladder: Make sure that it is fully opened. Do not climb a closed stepladder. Make sure that both sides of the stepladder are locked into place. Ask someone to hold it for you, if possible. Clearly mark and make sure that you can see: Any grab bars or handrails. First and last steps. Where the edge of each step is. Use tools that help you move around (mobility aids) if they are needed. These include: Canes. Walkers. Scooters. Crutches. Turn on the lights when you go into a dark area. Replace any light bulbs as soon as they burn out. Set up your furniture so you have a clear path. Avoid moving your furniture around. If any of your floors are uneven, fix them. If there are any pets around you, be aware of where they are. Review your medicines with your doctor. Some medicines can make you feel dizzy. This can increase your chance of falling. Ask your doctor what other things that you can do to help prevent falls. This information is not intended to replace advice given to you by your health care provider. Make sure you discuss any questions you have with your health care provider. Document Released: 06/24/2009 Document Revised: 02/03/2016 Document Reviewed: 10/02/2014 Elsevier Interactive Patient Education  2017 [employer].

## 2022-08-17 NOTE — Telephone Encounter (Signed)
Dr. Annamaria Boots, please advise if you are okay with taking over pt's care.

## 2022-08-17 NOTE — Telephone Encounter (Signed)
Called and spoke with pt letting her know that we could schedule her an appt wit CY and she verbalized understanding. Scheduled a new pt appt for pt.nothing further needed.

## 2022-08-17 NOTE — Progress Notes (Addendum)
Subjective:   [name] is a 81 y.o. female who presents for Medicare Annual (Subsequent) preventive examination.  I connected with  [name] on 08/17/22 by a audio enabled telemedicine application and verified that I am speaking with the correct person using two identifiers.  Patient Location: Home  Provider Location: Office/Clinic  I discussed the limitations of evaluation and management by telemedicine. The patient expressed understanding and agreed to proceed.   Review of Systems     Cardiac Risk Factors include: advanced age (>65men, >72 women);dyslipidemia;hypertension;sedentary lifestyle;obesity (BMI >30kg/m2)     Objective:    Today's Vitals   08/17/22 1617  Weight: 196 lb (88.9 kg)  Height: 5\' 6"  (1.676 m)   Body mass index is 31.64 kg/m.     08/17/2022    4:21 PM 11/08/2021   10:29 AM 02/17/2021   10:08 AM 06/15/2017   12:19 PM 06/14/2017    9:48 AM 07/28/2016    2:15 PM 07/28/2015    4:11 PM  Advanced Directives  Does Patient Have a Medical Advance Directive? Yes Yes Yes Yes Yes Yes Yes  Type of Paramedic of [place];Living will [place];Living will [place];Living will [place];Living will [place];Living will [place];Living will [place];Living will  Does patient want to make changes to medical advance directive? No - Patient declined No - Patient declined No - Patient declined   No - Patient declined No - Patient declined  Copy of [place] in Chart? Yes - validated most recent copy scanned in chart (See row information) Yes - validated most recent copy scanned in chart (See row information) Yes - validated most recent copy scanned in chart (See row information) Yes Yes No - copy requested No - copy requested    Current Medications (verified) Outpatient Encounter  Medications as of 08/17/2022  Medication Sig   acetaminophen (TYLENOL) 325 MG tablet Take by mouth.   albuterol (VENTOLIN HFA) 108 (90 Base) MCG/ACT inhaler Inhale 1 puff into the lungs every 6 (six) hours as needed for wheezing or shortness of breath.   co-enzyme Q-10 30 MG capsule Take 100 mg by mouth 3 (three) times daily.   Cyanocobalamin 1000 MCG CAPS Take by mouth.   doxazosin (CARDURA) 4 MG tablet TAKE 1 TABLET DAILY   FLUoxetine (PROZAC) 20 MG tablet Take 2 tablets (40 mg total) by mouth daily. Stop duloxetine   fluticasone (FLONASE) 50 MCG/ACT nasal spray USE 2 SPRAYS IN EACH NOSTRIL DAILY   Fluticasone-Umeclidin-Vilant (TRELEGY ELLIPTA) 100-62.5-25 MCG/ACT AEPB Inhale 1 puff into the lungs daily.   levothyroxine (SYNTHROID) 112 MCG tablet TAKE 1 TABLET DAILY (DISCONTINUE LEVOTHYROXINE 100)   losartan-hydrochlorothiazide (HYZAAR) 100-25 MG tablet TAKE 1 TABLET DAILY   OXYGEN 2lpm with sleep   rosuvastatin (CRESTOR) 20 MG tablet Take 1 tablet (20 mg total) by mouth daily.   temazepam (RESTORIL) 15 MG capsule Take 1 capsule (15 mg total) by mouth at bedtime as needed for sleep.   Calcium Carbonate-Vit D-Min (CALCIUM 1200 PO) Take 50 mg by mouth. ONCE A WEEK (Patient not taking: Reported on 07/06/2022)   gabapentin (NEURONTIN) 100 MG capsule Take 1 capsule (100 mg total) by mouth 3 (three) times daily. (Patient not taking: Reported on 07/06/2022)   meclizine (ANTIVERT) 25 MG tablet TAKE 1 TABLET BY MOUTH 3 TIMES DAILY AS NEEDED FOR DIZZINESS. (Patient not taking: Reported on 08/17/2022)  Multiple Vitamins-Minerals (EMERGEN-C FIVE PO) Take by mouth. (Patient not taking: Reported on 07/06/2022)   omeprazole (PRILOSEC) 20 MG capsule Take 1 capsule (20 mg total) by mouth daily. (Patient not taking: Reported on 07/06/2022)   thiamine (VITAMIN B-1) 100 MG tablet Take 100 mg by mouth daily. (Patient not taking: Reported on 07/06/2022)   No facility-administered encounter medications on file as of  08/17/2022.    Allergies (verified) Sulfonamide derivatives, Amoxicillin, Doxycycline, and Erythromycin   History: Past Medical History:  Diagnosis Date   ADD (attention deficit disorder)    Allergic rhinitis    Anemia    Anxiety    Arrhythmia    Arthritis    "hands, neck, right shoulder" (07/28/2015)   Aseptic necrosis ([place])    Asthma dxc'd 05/2015   Basal cell cancer    Bleeding stomach ulcer ~ 06/2005   COPD (chronic obstructive pulmonary disease) (HCC)    Depression    Diverticula of colon    Dyspnea    Esophagitis    Gastric ulcer with hemorrhage    GERD (gastroesophageal reflux disease)    Heart murmur    History of blood transfusion 06/2005   "related to pneumonia"   History of kidney stones    Hyperlipidemia    Hypertension    Hypothyroidism    Kidney stones    Lung cancer ([place])    Non small cell lung cancer, adenocarcinoma with BAC 09/2009, VATS converted to wedge resection of left upper lobe mass   Mitral valve prolapse    "no ORs" (07/28/2015)   Osteopenia    Pneumonia 1970's; 06/2005   Sleep apnea dx'd 06/2015   on oxygen at nite at 2L/[place]    Squamous cell skin cancer    Past Surgical History:  Procedure Laterality Date   ABDOMINAL HYSTERECTOMY  1988   COLONOSCOPY WITH PROPOFOL N/A 06/15/2017   Procedure: COLONOSCOPY WITH PROPOFOL;  Surgeon: Carol Ada, MD;  Location: [facility];  Service: Endoscopy;  Laterality: N/A;   HAMMER TOE SURGERY Right ~ 2010   SALPINGOOPHORECTOMY Left 1988   SKIN CANCER EXCISION     "I've had severl cut off RLE; left foreqrm, nose under nose"   THORACOTOMY Left 06/2005   THORACOTOMY/LOBECTOMY  09/2009; 11/2009   left; right   Family History  Problem Relation Age of Onset   Graves' disease Daughter    Breast cancer Maternal Aunt    Heart disease Maternal Aunt    Hypertension Mother    Arthritis Maternal Grandmother    Social History   Socioeconomic History   Marital status: Married    Spouse name: Not on file    Number of children: 3   Years of education: Not on file   Highest education level: Not on file  Occupational History   Occupation: retired Optician, dispensing: RETIRED  Tobacco Use   Smoking status: Former    Packs/day: 0.75    Years: 30.00    Total pack years: 22.50    Types: Cigarettes    Quit date: 09/11/2004    Years since quitting: 17.9   Smokeless tobacco: Never   Tobacco comments:    Pt is vaping x 1.5 years.  Contains nicotine  Vaping Use   Vaping Use: Never used  Substance and Sexual Activity   Alcohol use: Yes    Alcohol/week: 7.0 standard drinks of alcohol    Types: 7 Shots of liquor per week    Comment: 1-2 drinks nightly    Drug  use: No   Sexual activity: Yes  Other Topics Concern   Not on file  Social History Narrative   Not on file   Social Determinants of Health   Financial Resource Strain: Low Risk  (08/17/2022)   Overall Financial Resource Strain (CARDIA)    Difficulty of Paying Living Expenses: Not hard at all  Food Insecurity: No Food Insecurity (08/17/2022)   Hunger Vital Sign    Worried About Running Out of Food in the Last Year: Never true    Ran Out of Food in the Last Year: Never true  Transportation Needs: No Transportation Needs (08/17/2022)   PRAPARE - Hydrologist (Medical): No    Lack of Transportation (Non-Medical): No  Physical Activity: Inactive (08/17/2022)   Exercise Vital Sign    Days of Exercise per Week: 0 days    Minutes of Exercise per Session: 0 min  Stress: No Stress Concern Present (08/17/2022)   [place]    Feeling of Stress : Only a little  Social Connections: Moderately Integrated (08/17/2022)   Social Connection and Isolation Panel [NHANES]    Frequency of Communication with Friends and Family: More than three times a week    Frequency of Social Gatherings with Friends and Family: Three times a week    Attends Religious Services:  Never    Active Member of Clubs or Organizations: No    Attends Music therapist: More than 4 times per year    Marital Status: Married    Tobacco Counseling Counseling given: Not Answered Tobacco comments: Pt is vaping x 1.5 years.  Contains nicotine   Clinical Intake:  Pre-visit preparation completed: Yes  Pain : No/denies pain  Diabetes: No  How often do you need to have someone help you when you read instructions, pamphlets, or other written materials from your doctor or pharmacy?: 1 - Never  Diabetic?No   Interpreter Needed?: No  Information entered by :: Denman George LPN   Activities of Daily Living    08/17/2022    4:21 PM  In your present state of health, do you have any difficulty performing the following activities:  Hearing? 1  Vision? 0  Difficulty concentrating or making decisions? 0  Walking or climbing stairs? 1  Dressing or bathing? 0  Doing errands, shopping? 1  Preparing Food and eating ? Y  Using the Toilet? N  In the past six months, have you accidently leaked urine? N  Do you have problems with loss of bowel control? N  Managing your Medications? N  Managing your Finances? N  Housekeeping or managing your Housekeeping? Y    Patient Care Team: Susy Frizzle, MD as PCP - General (Family Medicine) Sueanne Margarita, MD as PCP - Cardiology (Cardiology) Dr. Marcella Dubs (Oncology)  Indicate any recent Medical Services you may have received from other than [facility] providers in the past year (date may be approximate).     Assessment:   This is a routine wellness examination for [place].  Hearing/Vision screen Hearing Screening - Comments:: Wears bilateral hearing aids   Vision Screening - Comments:: Wears rx glasses - will schedule routine eye exam      Dietary issues and exercise activities discussed: Current Exercise Habits: The patient does not participate in regular exercise at present   Goals Addressed              This Visit's Progress  Prevent falls        Depression Screen    08/17/2022    4:20 PM 07/06/2022   10:19 AM 04/05/2021   12:10 PM 02/17/2021   10:11 AM 12/13/2020   12:18 PM 06/29/2020   11:30 AM 11/12/2018   12:09 PM  PHQ 2/9 Scores  PHQ - 2 Score 1 1 6 4 6 6 6   PHQ- 9 Score 7  24 16 22 22 16     Fall Risk    08/17/2022    4:19 PM 07/06/2022   10:19 AM 02/17/2021   10:09 AM 12/13/2020   12:18 PM 10/08/2020    3:34 PM  Fall Risk   Falls in the past year? 1 1 0 0 0  Number falls in past yr: 1 1 0  0  Injury with Fall? 1 1 0  0  Risk for fall due to : Impaired balance/gait;Impaired mobility;History of fall(s) History of fall(s);Impaired balance/gait;Orthopedic patient Medication side effect No Fall Risks No Fall Risks  Follow up Education provided;Falls evaluation completed;Falls prevention discussed Education provided;Falls prevention discussed Falls evaluation completed;Falls prevention discussed  Falls evaluation completed    FALL RISK PREVENTION PERTAINING TO THE HOME:  Any stairs in or around the home? No  If so, are there any without handrails? No  Home free of loose throw rugs in walkways, pet beds, electrical cords, etc? Yes  Adequate lighting in your home to reduce risk of falls? Yes   ASSISTIVE DEVICES UTILIZED TO PREVENT FALLS:  Life alert? No  Use of a cane, walker or w/c? Yes  Grab bars in the bathroom? Yes  Shower chair or bench in shower? Yes  Elevated toilet seat or a handicapped toilet? Yes   TIMED UP AND GO:  Was the test performed? No .  Length of time to ambulate 10 feet: telephonic visit   Cognitive Function:        08/17/2022    4:22 PM 02/17/2021   10:16 AM  6CIT Screen  What Year? 0 points 0 points  What month? 0 points 0 points  What time? 0 points 0 points  Count back from 20 0 points 4 points  Months in reverse 0 points 0 points  Repeat phrase 0 points 0 points  Total Score 0 points 4 points    Immunizations Immunization  History  Administered Date(s) Administered   COVID-19, mRNA, vaccine(Comirnaty)12 years and older 06/14/2022   Fluad Quad(high Dose 65+) 05/13/2019, 06/29/2020, 06/08/2022   Influenza, High Dose Seasonal PF 06/22/2015   Influenza,inj,Quad PF,6+ Mos 05/29/2014, 05/30/2016, 05/28/2017, 05/21/2018   Influenza-Unspecified 05/29/2014, 06/23/2015, 05/30/2016, 05/28/2017, 05/21/2018   Moderna Sars-Covid-2 Vaccination 09/24/2019, 10/22/2019, 06/03/2020   Pneumococcal Conjugate-13 01/16/2014   Pneumococcal Polysaccharide-23 07/03/2012, 05/10/2020   Td 09/12/1993   Td (Adult), 2 Lf Tetanus Toxid, Preservative Free 09/12/1993   Tdap 12/21/2012   Zoster Recombinat (Shingrix) 01/04/2020, 03/04/2020   Zoster, Live 09/27/2007    TDAP status: Up to date  Flu Vaccine status: Up to date  Pneumococcal vaccine status: Up to date  Covid-19 vaccine status: Completed vaccines  Qualifies for Shingles Vaccine? Yes   Zostavax completed No   Shingrix Completed?: Yes  Screening Tests Health Maintenance  Topic Date Due   COVID-19 Vaccine (5 - 2023-24 season) 08/14/2023 (Originally 08/09/2022)   DTaP/Tdap/Td (4 - Td or Tdap) 12/22/2022   Pneumonia Vaccine 22+ Years old  Completed   INFLUENZA VACCINE  Completed   DEXA SCAN  Completed   Zoster Vaccines- Shingrix  Completed  HPV VACCINES  Aged Out    Health Maintenance  There are no preventive care reminders to display for this patient.  Colorectal cancer screening: No longer required.   Mammogram status: Completed 08/18/21. Repeat every year (scheduled for 09/07/22)  Bone Density status: Completed 08/18/21. Results reflect: Bone density results: OSTEOPENIA. Repeat every 2 years.  Lung Cancer Screening: (Low Dose CT Chest recommended if Age 24-80 years, 30 pack-year currently smoking OR have quit w/in 15years.) does not qualify.   Lung Cancer Screening Referral: n/a   Additional Screening:  Hepatitis C Screening: does not qualify  Vision  Screening: Recommended annual ophthalmology exams for early detection of glaucoma and other disorders of the eye. Is the patient up to date with their annual eye exam?  No  Who is the provider or what is the name of the office in which the patient attends annual eye exams? Unable to provide If pt is not established with a provider, would they like to be referred to a provider to establish care? No .   Dental Screening: Recommended annual dental exams for proper oral hygiene  Community Resource Referral / Chronic Care Management: CRR required this visit?  No   CCM required this visit?  No      Plan:     I have personally reviewed and noted the following in the patient's chart:   Medical and social history Use of alcohol, tobacco or illicit drugs  Current medications and supplements including opioid prescriptions. Patient is not currently taking opioid prescriptions. Functional ability and status Nutritional status Physical activity Advanced directives List of other physicians Hospitalizations, surgeries, and ER visits in previous 12 months Vitals Screenings to include cognitive, depression, and falls Referrals and appointments  In addition, I have reviewed and discussed with patient certain preventive protocols, quality metrics, and best practice recommendations. A written personalized care plan for preventive services as well as general preventive health recommendations were provided to patient.     Vanetta Mulders, LPN   [id number]   Due to this being a virtual visit, the after visit summary with patients personalized plan was offered to patient via mail or my-chart. per request, patient was mailed a copy of AVS  Nurse Notes: Patient would like to know if Temazepam strength can be increased she is still having problems with insomnia and is taking Tylenol 1000mg  every night as well.

## 2022-08-21 DIAGNOSIS — M6281 Muscle weakness (generalized): Secondary | ICD-10-CM | POA: Diagnosis not present

## 2022-08-21 DIAGNOSIS — R2681 Unsteadiness on feet: Secondary | ICD-10-CM | POA: Diagnosis not present

## 2022-08-22 ENCOUNTER — Telehealth: Payer: Self-pay

## 2022-08-22 NOTE — Telephone Encounter (Signed)
Tychel w/[facility], request a referral to be fax over at [phone]  Referral put up front to be fax out.

## 2022-08-25 DIAGNOSIS — M6281 Muscle weakness (generalized): Secondary | ICD-10-CM | POA: Diagnosis not present

## 2022-08-25 DIAGNOSIS — R2681 Unsteadiness on feet: Secondary | ICD-10-CM | POA: Diagnosis not present

## 2022-09-01 ENCOUNTER — Encounter: Payer: Self-pay | Admitting: Family Medicine

## 2022-09-05 ENCOUNTER — Other Ambulatory Visit: Payer: Self-pay

## 2022-09-05 DIAGNOSIS — M5136 Other intervertebral disc degeneration, lumbar region: Secondary | ICD-10-CM

## 2022-09-05 DIAGNOSIS — R2689 Other abnormalities of gait and mobility: Secondary | ICD-10-CM

## 2022-09-08 ENCOUNTER — Ambulatory Visit
Admission: RE | Admit: 2022-09-08 | Discharge: 2022-09-08 | Disposition: A | Discharge status: Home / Self Care | Payer: Medicare Other | Source: Ambulatory Visit | Attending: Family Medicine | Admitting: Family Medicine

## 2022-09-08 DIAGNOSIS — Z1231 Encounter for screening mammogram for malignant neoplasm of breast: Secondary | ICD-10-CM

## 2022-09-11 ENCOUNTER — Encounter: Payer: Self-pay | Admitting: Family Medicine

## 2022-09-12 ENCOUNTER — Telehealth: Payer: Self-pay

## 2022-09-12 NOTE — Telephone Encounter (Signed)
My Chart message received from Pt's daugther:  Mom tested positive for COVID on 09/11/22. Have a low-grade fever, sneezing and coughing. What treatment options are available?   Could Paxlovid be sent in for patient? Thank you! MJ

## 2022-09-13 ENCOUNTER — Telehealth: Payer: Self-pay

## 2022-09-13 ENCOUNTER — Encounter: Payer: Self-pay | Admitting: Family Medicine

## 2022-09-13 NOTE — Telephone Encounter (Signed)
Pt called stating that pt has been tested positive for Covid 09/11/2022 feels miserable. Would like a Rx paxlovid sent [facility] on [address]  Also for husband as well, tested positive 09/12/22 for covid.

## 2022-09-14 ENCOUNTER — Other Ambulatory Visit: Payer: Self-pay | Admitting: Family Medicine

## 2022-09-14 MED ORDER — NIRMATRELVIR/RITONAVIR (PAXLOVID)TABLET: 3.0000 | ORAL_TABLET | Freq: Two times a day (BID) | ORAL | 0 refills | Status: AC

## 2022-09-23 NOTE — Progress Notes (Signed)
Pulmonary Consult    Self referral. She has seen Dr. Delton Coombes in the past- last in 2012. She has been seeing Dr. Katrine Coho at [facility] and is here today to re-est care. She c/o DOE with cleaning her house or taking out the trash. She is on O2 with sleep at 2lpm. She uses proiar 1 x per wk on average and has a neb with albuterol that she rarely uses.   doe x MMRC2 = can't walk a nl pace on a flat grade s sob but does fine slow and flat eg shopping at slow pace / can't do ramps Doe Never changed since empyema despite having RT and chemo for BAC dx 09/14/09 at [facility] with [facility] 11/2009 and persistent bilateral MPN's under surveillance at [facility] but no recent bx's or further chemo. ========================================================================================  09/25/22- 81yoF former smoker, NOW Vaping, with COPD, O2 2L for sleep, hx /rul lobectomy for BAC/ chemo/ RT 2011, multiple pulmonary nodules, CAD,  Last saw Dr Sherene Sires 07/2022. O2 3L  Medical problem list PVCs, HTN, hx Mitral Prolapse, Allergic Rhinitis, GERD, Hypothyroid, Osteopenia/ hx Aseptic Necrosis, Polycythemia,  - Ventolin hfA  Trelegy 100,    Neb Duoneb Had COVID infection in late December treated with Paxlovid. Has had COVID, RSV, flu vaccines. Gradually more dyspnea on exertion over the past year without sudden event other than acute illness in late December.  Using oxygen 3 L at night and POC during the day.  Oxygen saturation drops quickly with exertion but okay on room air at rest. Coughing yellow-green mucus.  Home nebulizer with DuoNeb does help. Has done pulmonary rehabilitation 3 times. Husband recently died CVA. CXR 07/13/22- MPRESSION: Chronic changes/treatment changes, without evidence of acute cardiopulmonary disease CT chest 11/08/21- MPRESSION: 1. Stable examination demonstrating postoperative changes of right upper lobectomy and left upper lobe wedge resection with chronic postradiation mass-like fibrosis in the  left upper lobe. Small pulmonary nodules in the lungs bilaterally appear stable in number and size compared to prior examinations, favored to be benign. 2. Aortic atherosclerosis, in addition to 2 vessel coronary artery disease. Assessment for potential risk factor modification, dietary therapy or pharmacologic therapy may be warranted, if clinically indicated. 3. Hepatic steatosis. Aortic Atherosclerosis (ICD10-I70.0).  Prior to Admission medications   Medication Sig Start Date End Date Taking? Authorizing Provider  acetaminophen (TYLENOL) 325 MG tablet Take by mouth.   Yes [provider]  albuterol (VENTOLIN HFA) 108 (90 Base) MCG/ACT inhaler Inhale 1 puff into the lungs every 6 (six) hours as needed for wheezing or shortness of breath.   Yes [provider]  azithromycin (ZITHROMAX) 250 MG tablet 2 today then one daily 09/25/22  Yes Haroon Shatto, Joni Fears D, MD  Calcium Carbonate-Vit D-Min (CALCIUM 1200 PO) Take 50 mg by mouth. ONCE A WEEK   Yes [provider]  co-enzyme Q-10 30 MG capsule Take 100 mg by mouth 3 (three) times daily.   Yes [provider]  Cyanocobalamin 1000 MCG CAPS Take by mouth.   Yes [provider]  doxazosin (CARDURA) 4 MG tablet TAKE 1 TABLET DAILY 03/23/21  Yes Donita Brooks, MD  FLUoxetine (PROZAC) 20 MG tablet Take 2 tablets (40 mg total) by mouth daily. Stop duloxetine 02/24/22  Yes Donita Brooks, MD  fluticasone [facility]) 50 MCG/ACT nasal spray USE 2 SPRAYS IN [facility] NOSTRIL DAILY 01/02/22  Yes Donita Brooks, MD  Fluticasone-Umeclidin-Vilant (TRELEGY ELLIPTA) 100-62.5-25 MCG/ACT AEPB Inhale 1 puff into the lungs daily. 11/28/21  Yes Cobb, Natalia Leatherwood  V, NP  levothyroxine (SYNTHROID) 112 MCG tablet TAKE 1 TABLET DAILY (DISCONTINUE LEVOTHYROXINE 100) 12/27/21  Yes Donita Brooks, MD  losartan-hydrochlorothiazide (HYZAAR) 100-25 MG tablet TAKE 1 TABLET DAILY 04/17/22  Yes Donita Brooks, MD  meclizine (ANTIVERT) 25 MG  tablet TAKE 1 TABLET BY MOUTH 3 TIMES DAILY AS NEEDED FOR DIZZINESS. 06/26/22  Yes Donita Brooks, MD  OXYGEN 2lpm with sleep   Yes [provider]  rosuvastatin (CRESTOR) 20 MG tablet Take 1 tablet (20 mg total) by mouth daily. 07/11/22  Yes Donita Brooks, MD  thiamine (VITAMIN B-1) 100 MG tablet Take 100 mg by mouth daily.   Yes [provider]  doxepin (SINEQUAN) 25 MG capsule Take 1 capsule (25 mg total) by mouth at bedtime as needed. 10/24/22   Donita Brooks, MD  gabapentin (NEURONTIN) 100 MG capsule Take 1 capsule (100 mg total) by mouth 3 (three) times daily. 09/29/21   Donita Brooks, MD  Multiple Vitamins-Minerals (EMERGEN-C FIVE PO) Take by mouth.    [provider]  omeprazole (PRILOSEC) 20 MG capsule Take 1 capsule (20 mg total) by mouth daily. 05/12/21   Donita Brooks, MD   Past Medical History:  Diagnosis Date   ADD (attention deficit disorder)    Allergic rhinitis    Anemia    Anxiety    Arrhythmia    Arthritis    "hands, neck, right shoulder" (07/28/2015)   Aseptic necrosis (HCC)    Asthma dxc'd 05/2015   Basal cell cancer    Bleeding stomach ulcer ~ 06/2005   COPD (chronic obstructive pulmonary disease) (HCC)    Depression    Diverticula of colon    Dyspnea    Esophagitis    Gastric ulcer with hemorrhage    GERD (gastroesophageal reflux disease)    Heart murmur    History of blood transfusion 06/2005   "related to pneumonia"   History of kidney stones    Hyperlipidemia    Hypertension    Hypothyroidism    Kidney stones    Lung cancer (HCC)    Non small cell lung cancer, adenocarcinoma with BAC 09/2009, VATS converted to wedge resection of left upper lobe mass   Mitral valve prolapse    "no ORs" (07/28/2015)   Osteopenia    Pneumonia 1970's; 06/2005   Sleep apnea dx'd 06/2015   on oxygen at nite at 2L/[place]    Squamous cell skin cancer    Past Surgical History:  Procedure Laterality Date   ABDOMINAL HYSTERECTOMY   1988   COLONOSCOPY WITH PROPOFOL N/A 06/15/2017   Procedure: COLONOSCOPY WITH PROPOFOL;  Surgeon: Jeani Hawking, MD;  Location: [facility];  Service: Endoscopy;  Laterality: N/A;   HAMMER TOE SURGERY Right ~ 2010   SALPINGOOPHORECTOMY Left 1988   SKIN CANCER EXCISION     "I've had severl cut off RLE; left foreqrm, nose under nose"   THORACOTOMY Left 06/2005   THORACOTOMY/LOBECTOMY  09/2009; 11/2009   left; right   Family History  Problem Relation Age of Onset   Graves' disease Daughter    Breast cancer Maternal Aunt    Heart disease Maternal Aunt    Hypertension Mother    Arthritis Maternal Grandmother    Social History   Socioeconomic History   Marital status: Married    Spouse name: Not on file   Number of children: 3   Years of education: Not on file   Highest education level: Not on file  Occupational  History   Occupation: retired Academic librarian: RETIRED  Tobacco Use   Smoking status: Former    Packs/day: 0.75    Years: 30.00    Total pack years: 22.50    Types: Cigarettes    Quit date: 09/11/2004    Years since quitting: 18.1   Smokeless tobacco: Never   Tobacco comments:    Pt is vaping x 1.5 years.  Contains nicotine  Vaping Use   Vaping Use: Never used  Substance and Sexual Activity   Alcohol use: Yes    Alcohol/week: 7.0 standard drinks of alcohol    Types: 7 Shots of liquor per week    Comment: 1-2 drinks nightly    Drug use: No   Sexual activity: Yes  Other Topics Concern   Not on file  Social History Narrative   Not on file   Social Determinants of Health   Financial Resource Strain: Low Risk  (08/17/2022)   Overall Financial Resource Strain (CARDIA)    Difficulty of Paying Living Expenses: Not hard at all  Food Insecurity: No Food Insecurity (08/17/2022)   Hunger Vital Sign    Worried About Running Out of Food in the Last Year: Never true    Ran Out of Food in the Last Year: Never true  Transportation Needs: No Transportation Needs  (08/17/2022)   PRAPARE - Administrator, Civil Service (Medical): No    Lack of Transportation (Non-Medical): No  Physical Activity: Inactive (08/17/2022)   Exercise Vital Sign    Days of Exercise per Week: 0 days    Minutes of Exercise per Session: 0 min  Stress: No Stress Concern Present (08/17/2022)   [employer] of Occupational Health - Occupational Stress Questionnaire    Feeling of Stress : Only a little  Social Connections: Moderately Integrated (08/17/2022)   Social Connection and Isolation Panel [NHANES]    Frequency of Communication with Friends and Family: More than three times a week    Frequency of Social Gatherings with Friends and Family: Three times a week    Attends Religious Services: Never    Active Member of Clubs or Organizations: No    Attends Engineer, structural: More than 4 times per year    Marital Status: Married  Catering manager Violence: Not At Risk (08/17/2022)   Humiliation, Afraid, Rape, and Kick questionnaire    Fear of Current or Ex-Partner: No    Emotionally Abused: No    Physically Abused: No    Sexually Abused: No     ROS-see HPI   + = positive Constitutional:    weight loss, night sweats, fevers, chills, fatigue, lassitude. HEENT:    headaches, difficulty swallowing, tooth/dental problems, sore throat,       sneezing, itching, ear ache, nasal congestion, post nasal drip, snoring CV:    chest pain, orthopnea, PND, swelling in lower extremities, anasarca,                                   dizziness, palpitations Resp:   shortness of breath with exertion or at rest.                +productive cough,   non-productive cough, coughing up of blood.              +change in color of mucus.  wheezing.   Skin:    rash or lesions. GI:  No-   heartburn, indigestion, abdominal pain, nausea, vomiting, diarrhea,                 change in bowel habits, loss of appetite GU: dysuria, change in color of urine, no urgency or frequency.    flank pain. MS:   joint pain, stiffness, decreased range of motion, back pain. Neuro-     nothing unusual Psych:  change in mood or affect.  depression or anxiety.   memory loss.  OBJ- Physical Exam General- Alert, Oriented, Affect-appropriate, Distress- none acute Skin- rash-none, lesions- none, excoriation- none Lymphadenopathy- none Head- atraumatic            Eyes- Gross vision intact, PERRLA, conjunctivae and secretions clear            Ears- Hearing, canals-normal            Nose- Clear, no-Septal dev, mucus, polyps, erosion, perforation             Throat- Mallampati II , mucosa clear , drainage- none, tonsils- atrophic Neck- flexible , trachea midline, no stridor , thyroid nl, carotid no bruit Chest - symmetrical excursion , unlabored           Heart/CV- RRR , no murmur , no gallop  , no rub, nl s1 s2                           - JVD- none , edema- none, stasis changes- none, varices- none           Lung- clear to P&A, wheeze- none, cough- none , dullness-none, rub- none           Chest wall-  Abd-  Br/ Gen/ Rectal- Not done, not indicated Extrem- cyanosis- none, clubbing, none, atrophy- none, strength- nl Neuro- grossly intact to observation

## 2022-09-25 ENCOUNTER — Ambulatory Visit (INDEPENDENT_AMBULATORY_CARE_PROVIDER_SITE_OTHER): Payer: Medicare Other | Admitting: Internal Medicine

## 2022-09-25 VITALS — BP 122/80 | HR 96

## 2022-09-25 DIAGNOSIS — J441 Chronic obstructive pulmonary disease with (acute) exacerbation: Secondary | ICD-10-CM | POA: Diagnosis not present

## 2022-09-25 DIAGNOSIS — C3480 Malignant neoplasm of overlapping sites of unspecified bronchus and lung: Secondary | ICD-10-CM

## 2022-09-25 DIAGNOSIS — Z720 Tobacco use: Secondary | ICD-10-CM

## 2022-09-25 MED ORDER — AZITHROMYCIN 250 MG PO TABS: ORAL_TABLET | ORAL | 0 refills | Status: DC

## 2022-09-25 NOTE — Patient Instructions (Signed)
Order- schedule PFT    dx COPD mixed type  Script sent for Zpak to [facility]   It is time to stop vaping. You need not to be inhaling anything but air !  Look for ways to gradually increase your activity to rebuild some stamina.

## 2022-10-10 ENCOUNTER — Encounter: Payer: Self-pay | Admitting: Family Medicine

## 2022-10-20 DIAGNOSIS — Z7409 Other reduced mobility: Secondary | ICD-10-CM | POA: Diagnosis not present

## 2022-10-20 DIAGNOSIS — R2681 Unsteadiness on feet: Secondary | ICD-10-CM | POA: Diagnosis not present

## 2022-10-23 ENCOUNTER — Encounter: Payer: Self-pay | Admitting: Family Medicine

## 2022-10-24 ENCOUNTER — Other Ambulatory Visit: Payer: Self-pay | Admitting: Family Medicine

## 2022-10-24 DIAGNOSIS — Z7409 Other reduced mobility: Secondary | ICD-10-CM | POA: Diagnosis not present

## 2022-10-24 DIAGNOSIS — R2681 Unsteadiness on feet: Secondary | ICD-10-CM | POA: Diagnosis not present

## 2022-10-24 MED ORDER — DOXEPIN HCL 25 MG PO CAPS: 25.0000 mg | ORAL_CAPSULE | Freq: Every evening | ORAL | 1 refills | Status: DC | PRN

## 2022-10-31 DIAGNOSIS — R2681 Unsteadiness on feet: Secondary | ICD-10-CM | POA: Diagnosis not present

## 2022-10-31 DIAGNOSIS — Z7409 Other reduced mobility: Secondary | ICD-10-CM | POA: Diagnosis not present

## 2022-11-01 ENCOUNTER — Encounter: Payer: Self-pay | Admitting: Internal Medicine

## 2022-11-01 NOTE — Assessment & Plan Note (Signed)
Acute bronchitic exacerbation currently Plan-Z-Pak.  Schedule PFT.

## 2022-11-01 NOTE — Assessment & Plan Note (Signed)
Follow-up chest CT scheduled by Oncology

## 2022-11-01 NOTE — Assessment & Plan Note (Signed)
Reports vaping over the past year and a half.  She absolutely must stop smoking and all forms as discussed.

## 2022-11-02 DIAGNOSIS — Z7409 Other reduced mobility: Secondary | ICD-10-CM | POA: Diagnosis not present

## 2022-11-02 DIAGNOSIS — R2681 Unsteadiness on feet: Secondary | ICD-10-CM | POA: Diagnosis not present

## 2022-11-06 ENCOUNTER — Ambulatory Visit (HOSPITAL_COMMUNITY)
Admission: RE | Admit: 2022-11-06 | Discharge: 2022-11-06 | Disposition: A | Discharge status: Home / Self Care | Payer: Medicare Other | Source: Ambulatory Visit | Attending: Internal Medicine | Admitting: Internal Medicine

## 2022-11-06 ENCOUNTER — Inpatient Hospital Stay: Payer: Medicare Other | Attending: Internal Medicine

## 2022-11-06 ENCOUNTER — Other Ambulatory Visit: Payer: Self-pay

## 2022-11-06 DIAGNOSIS — C3490 Malignant neoplasm of unspecified part of unspecified bronchus or lung: Secondary | ICD-10-CM

## 2022-11-06 DIAGNOSIS — R0602 Shortness of breath: Secondary | ICD-10-CM | POA: Insufficient documentation

## 2022-11-06 DIAGNOSIS — I3139 Other pericardial effusion (noninflammatory): Secondary | ICD-10-CM | POA: Diagnosis not present

## 2022-11-06 DIAGNOSIS — Z881 Allergy status to other antibiotic agents status: Secondary | ICD-10-CM | POA: Diagnosis not present

## 2022-11-06 DIAGNOSIS — Z90721 Acquired absence of ovaries, unilateral: Secondary | ICD-10-CM | POA: Insufficient documentation

## 2022-11-06 DIAGNOSIS — G8929 Other chronic pain: Secondary | ICD-10-CM | POA: Diagnosis not present

## 2022-11-06 DIAGNOSIS — I700 Atherosclerosis of aorta: Secondary | ICD-10-CM | POA: Insufficient documentation

## 2022-11-06 DIAGNOSIS — Z882 Allergy status to sulfonamides status: Secondary | ICD-10-CM | POA: Diagnosis not present

## 2022-11-06 DIAGNOSIS — M954 Acquired deformity of chest and rib: Secondary | ICD-10-CM | POA: Insufficient documentation

## 2022-11-06 DIAGNOSIS — Z880 Allergy status to penicillin: Secondary | ICD-10-CM | POA: Insufficient documentation

## 2022-11-06 DIAGNOSIS — I10 Essential (primary) hypertension: Secondary | ICD-10-CM | POA: Diagnosis not present

## 2022-11-06 DIAGNOSIS — F0393 Unspecified dementia, unspecified severity, with mood disturbance: Secondary | ICD-10-CM | POA: Insufficient documentation

## 2022-11-06 DIAGNOSIS — K449 Diaphragmatic hernia without obstruction or gangrene: Secondary | ICD-10-CM | POA: Insufficient documentation

## 2022-11-06 DIAGNOSIS — R531 Weakness: Secondary | ICD-10-CM | POA: Insufficient documentation

## 2022-11-06 DIAGNOSIS — F0394 Unspecified dementia, unspecified severity, with anxiety: Secondary | ICD-10-CM | POA: Insufficient documentation

## 2022-11-06 DIAGNOSIS — Z90710 Acquired absence of both cervix and uterus: Secondary | ICD-10-CM | POA: Diagnosis not present

## 2022-11-06 DIAGNOSIS — C3412 Malignant neoplasm of upper lobe, left bronchus or lung: Secondary | ICD-10-CM | POA: Diagnosis not present

## 2022-11-06 DIAGNOSIS — J9811 Atelectasis: Secondary | ICD-10-CM | POA: Diagnosis not present

## 2022-11-06 DIAGNOSIS — E039 Hypothyroidism, unspecified: Secondary | ICD-10-CM | POA: Insufficient documentation

## 2022-11-06 DIAGNOSIS — Z87442 Personal history of urinary calculi: Secondary | ICD-10-CM | POA: Diagnosis not present

## 2022-11-06 DIAGNOSIS — K760 Fatty (change of) liver, not elsewhere classified: Secondary | ICD-10-CM | POA: Insufficient documentation

## 2022-11-06 DIAGNOSIS — Z79899 Other long term (current) drug therapy: Secondary | ICD-10-CM | POA: Diagnosis not present

## 2022-11-06 LAB — CBC WITH DIFFERENTIAL (CANCER CENTER ONLY)
Abs Immature Granulocytes: 0.04 10*3/uL (ref 0.00–0.07)
Basophils Absolute: 0.1 10*3/uL (ref 0.0–0.1)
Basophils Relative: 1 %
Eosinophils Absolute: 0.2 10*3/uL (ref 0.0–0.5)
Eosinophils Relative: 2 %
HCT: 40 % (ref 36.0–46.0)
Hemoglobin: 13.9 g/dL (ref 12.0–15.0)
Immature Granulocytes: 1 %
Lymphocytes Relative: 29 %
Lymphs Abs: 2.5 10*3/uL (ref 0.7–4.0)
MCH: 34.1 pg — ABNORMAL HIGH (ref 26.0–34.0)
MCHC: 34.8 g/dL (ref 30.0–36.0)
MCV: 98 fL (ref 80.0–100.0)
Monocytes Absolute: 0.8 10*3/uL (ref 0.1–1.0)
Monocytes Relative: 9 %
Neutro Abs: 5 10*3/uL (ref 1.7–7.7)
Neutrophils Relative %: 58 %
Platelet Count: 264 10*3/uL (ref 150–400)
RBC: 4.08 MIL/uL (ref 3.87–5.11)
RDW: 12.8 % (ref 11.5–15.5)
WBC Count: 8.6 10*3/uL (ref 4.0–10.5)
nRBC: 0 % (ref 0.0–0.2)

## 2022-11-06 LAB — CMP (CANCER CENTER ONLY)
ALT: 24 U/L (ref 0–44)
AST: 28 U/L (ref 15–41)
Albumin: 4.2 g/dL (ref 3.5–5.0)
Alkaline Phosphatase: 68 U/L (ref 38–126)
Anion gap: 8 (ref 5–15)
BUN: 14 mg/dL (ref 8–23)
CO2: 30 mmol/L (ref 22–32)
Calcium: 9 mg/dL (ref 8.9–10.3)
Chloride: 101 mmol/L (ref 98–111)
Creatinine: 0.78 mg/dL (ref 0.44–1.00)
GFR, Estimated: >60 mL/min (ref >60)
Glucose, Bld: 95 mg/dL (ref 70–99)
Potassium: 4.1 mmol/L (ref 3.5–5.1)
Sodium: 139 mmol/L (ref 135–145)
Total Bilirubin: 0.4 mg/dL (ref 0.3–1.2)
Total Protein: 6.8 g/dL (ref 6.5–8.1)

## 2022-11-06 MED ORDER — IOHEXOL 300 MG/ML  SOLN
75.0000 mL | Freq: Once | INTRAMUSCULAR | Status: AC | PRN
  Administered 2022-11-06: 75 mL via INTRAVENOUS

## 2022-11-07 DIAGNOSIS — R2681 Unsteadiness on feet: Secondary | ICD-10-CM | POA: Diagnosis not present

## 2022-11-07 DIAGNOSIS — Z7409 Other reduced mobility: Secondary | ICD-10-CM | POA: Diagnosis not present

## 2022-11-08 ENCOUNTER — Other Ambulatory Visit: Payer: Self-pay

## 2022-11-08 ENCOUNTER — Inpatient Hospital Stay (HOSPITAL_BASED_OUTPATIENT_CLINIC_OR_DEPARTMENT_OTHER): Payer: Medicare Other | Admitting: Internal Medicine

## 2022-11-08 VITALS — BP 142/77 | HR 88 | Temp 98.3°F | Resp 17 | Wt 196.1 lb

## 2022-11-08 DIAGNOSIS — C3412 Malignant neoplasm of upper lobe, left bronchus or lung: Secondary | ICD-10-CM | POA: Diagnosis not present

## 2022-11-08 DIAGNOSIS — F0394 Unspecified dementia, unspecified severity, with anxiety: Secondary | ICD-10-CM | POA: Diagnosis not present

## 2022-11-08 DIAGNOSIS — F0393 Unspecified dementia, unspecified severity, with mood disturbance: Secondary | ICD-10-CM | POA: Diagnosis not present

## 2022-11-08 DIAGNOSIS — C3490 Malignant neoplasm of unspecified part of unspecified bronchus or lung: Secondary | ICD-10-CM

## 2022-11-08 DIAGNOSIS — R531 Weakness: Secondary | ICD-10-CM | POA: Diagnosis not present

## 2022-11-08 DIAGNOSIS — R0602 Shortness of breath: Secondary | ICD-10-CM | POA: Diagnosis not present

## 2022-11-08 DIAGNOSIS — I10 Essential (primary) hypertension: Secondary | ICD-10-CM | POA: Diagnosis not present

## 2022-11-08 NOTE — Progress Notes (Signed)
[facility] [phone]   [phone]  OFFICE PROGRESS NOTE  Susy Frizzle, MD [address] [place] [address]  DIAGNOSIS: history of a bilateral stage IA non-small cell lung cancer involving the left upper lobe as well as right upper lobe  PRIOR THERAPY: 1)  status post wedge resection of the 2 lesions in 2011 under the care of Dr. Arlyce Dice.  2) systemic chemotherapy with carboplatin, Alimta and Avastin for 6 cycles at [facility] completed in 2011. 3) stereotactic radiotherapy to the enlarging left upper lobe lung nodule in 2014.  CURRENT THERAPY: Observation.  INTERVAL HISTORY: [name] 82 y.o. female returns to clinic today for follow-up visit accompanied by her caregiver.  The patient is feeling fine today with no concerning complaints except for weakness in the lower extremities and she is using a walker.  She is feeling a little bit better after her husband died.  She was taking care of him with dementia..  She denied having any current chest pain but has shortness of breath with exertion with no cough or hemoptysis.  She has no nausea, vomiting, diarrhea or constipation.  She has no headache or visual changes.  She is here today for evaluation with repeat CT scan of the chest for restaging of her disease.  MEDICAL HISTORY: Past Medical History:  Diagnosis Date   ADD (attention deficit disorder)    Allergic rhinitis    Anemia    Anxiety    Arrhythmia    Arthritis    "hands, neck, right shoulder" (07/28/2015)   Aseptic necrosis ([place])    Asthma dxc'd 05/2015   Basal cell cancer    Bleeding stomach ulcer ~ 06/2005   COPD (chronic obstructive pulmonary disease) (HCC)    Depression    Diverticula of colon    Dyspnea    Esophagitis    Gastric ulcer with hemorrhage    GERD (gastroesophageal reflux disease)    Heart murmur    History of blood transfusion 06/2005   "related to pneumonia"    History of kidney stones    Hyperlipidemia    Hypertension    Hypothyroidism    Kidney stones    Lung cancer ([place])    Non small cell lung cancer, adenocarcinoma with BAC 09/2009, VATS converted to wedge resection of left upper lobe mass   Mitral valve prolapse    "no ORs" (07/28/2015)   Osteopenia    Pneumonia 1970's; 06/2005   Sleep apnea dx'd 06/2015   on oxygen at nite at 2L/[place]    Squamous cell skin cancer     ALLERGIES:  is allergic to sulfonamide derivatives, amoxicillin, doxycycline, and erythromycin.  MEDICATIONS:  Current Outpatient Medications  Medication Sig Dispense Refill   acetaminophen (TYLENOL) 325 MG tablet Take by mouth.     albuterol (VENTOLIN HFA) 108 (90 Base) MCG/ACT inhaler Inhale 1 puff into the lungs every 6 (six) hours as needed for wheezing or shortness of breath.     azithromycin (ZITHROMAX) 250 MG tablet 2 today then one daily 6 tablet 0   Calcium Carbonate-Vit D-Min (CALCIUM 1200 PO) Take 50 mg by mouth. ONCE A WEEK     co-enzyme Q-10 30 MG capsule Take 100 mg by mouth 3 (three) times daily.     Cyanocobalamin 1000 MCG CAPS Take by mouth.     doxazosin (CARDURA) 4 MG tablet TAKE 1 TABLET DAILY 90 tablet 3   doxepin (  SINEQUAN) 25 MG capsule Take 1 capsule (25 mg total) by mouth at bedtime as needed. 30 capsule 1   FLUoxetine (PROZAC) 20 MG tablet Take 2 tablets (40 mg total) by mouth daily. Stop duloxetine 180 tablet 3   fluticasone (FLONASE) 50 MCG/ACT nasal spray USE 2 SPRAYS IN EACH NOSTRIL DAILY 48 g 3   Fluticasone-Umeclidin-Vilant (TRELEGY ELLIPTA) 100-62.5-25 MCG/ACT AEPB Inhale 1 puff into the lungs daily. 1 each 5   levothyroxine (SYNTHROID) 112 MCG tablet TAKE 1 TABLET DAILY (DISCONTINUE LEVOTHYROXINE 100) 90 tablet 3   losartan-hydrochlorothiazide (HYZAAR) 100-25 MG tablet TAKE 1 TABLET DAILY 90 tablet 3   meclizine (ANTIVERT) 25 MG tablet TAKE 1 TABLET BY MOUTH 3 TIMES DAILY AS NEEDED FOR DIZZINESS. 30 tablet 0   OXYGEN 2lpm with sleep      rosuvastatin (CRESTOR) 20 MG tablet Take 1 tablet (20 mg total) by mouth daily. 90 tablet 3   thiamine (VITAMIN B-1) 100 MG tablet Take 100 mg by mouth daily.     No current facility-administered medications for this visit.    SURGICAL HISTORY:  Past Surgical History:  Procedure Laterality Date   ABDOMINAL HYSTERECTOMY  1988   COLONOSCOPY WITH PROPOFOL N/A 06/15/2017   Procedure: COLONOSCOPY WITH PROPOFOL;  Surgeon: Carol Ada, MD;  Location: [facility];  Service: Endoscopy;  Laterality: N/A;   HAMMER TOE SURGERY Right ~ 2010   SALPINGOOPHORECTOMY Left 1988   SKIN CANCER EXCISION     "I've had severl cut off RLE; left foreqrm, nose under nose"   THORACOTOMY Left 06/2005   THORACOTOMY/LOBECTOMY  09/2009; 11/2009   left; right    REVIEW OF SYSTEMS:  A comprehensive review of systems was negative except for: Respiratory: positive for dyspnea on exertion Musculoskeletal: positive for back pain   PHYSICAL EXAMINATION: General appearance: alert, cooperative, and no distress Head: Normocephalic, without obvious abnormality, atraumatic Neck: no adenopathy, no JVD, supple, symmetrical, trachea midline, and thyroid not enlarged, symmetric, no tenderness/mass/nodules Lymph nodes: Cervical, supraclavicular, and axillary nodes normal. Resp: clear to auscultation bilaterally Back: symmetric, no curvature. ROM normal. No CVA tenderness. Cardio: regular rate and rhythm, S1, S2 normal, no murmur, click, rub or gallop GI: soft, non-tender; bowel sounds normal; no masses,  no organomegaly Extremities: extremities normal, atraumatic, no cyanosis or edema  ECOG PERFORMANCE STATUS: 1 - Symptomatic but completely ambulatory  Blood pressure (!) 142/77, pulse 88, temperature 98.3 F (36.8 C), temperature source Oral, resp. rate 17, weight 196 lb 1 oz (88.9 kg), SpO2 95 %.  LABORATORY DATA: Lab Results  Component Value Date   WBC 8.6 11/06/2022   HGB 13.9 11/06/2022   HCT 40.0 11/06/2022   MCV  98.0 11/06/2022   PLT 264 11/06/2022      Chemistry      Component Value Date/Time   NA 139 11/06/2022 1409   K 4.1 11/06/2022 1409   CL 101 11/06/2022 1409   CO2 30 11/06/2022 1409   BUN 14 11/06/2022 1409   CREATININE 0.78 11/06/2022 1409   CREATININE 1.33 (H) 07/06/2022 1040      Component Value Date/Time   CALCIUM 9.0 11/06/2022 1409   ALKPHOS 68 11/06/2022 1409   AST 28 11/06/2022 1409   ALT 24 11/06/2022 1409   BILITOT 0.4 11/06/2022 1409       RADIOGRAPHIC STUDIES: CT Chest W Contrast  Result Date: 11/07/2022 CLINICAL DATA:  Staging non-small-cell lung cancer. * Tracking Code: BO * EXAM: CT CHEST WITH CONTRAST TECHNIQUE: Multidetector CT imaging of the chest  was performed during intravenous contrast administration. RADIATION DOSE REDUCTION: This exam was performed according to the departmental dose-optimization program which includes automated exposure control, adjustment of the mA and/or kV according to patient size and/or use of iterative reconstruction technique. CONTRAST:  5m OMNIPAQUE IOHEXOL 300 MG/ML  SOLN COMPARISON:  CT chest 11/07/2021 and older FINDINGS: Cardiovascular: Heart is nonenlarged. Small pericardial effusion. Prominent epidural fat. This includes fat extending along the area of the intra-atrial septum. Coronary artery calcifications are identified. The thoracic aorta overall has a normal course and caliber with scattered partially calcified plaque. No dissection or aneurysm formation. Mediastinum/Nodes: Small hiatal hernia. Thoracic esophagus otherwise grossly preserved. Atrophic thyroid gland. No specific abnormal lymph node enlargement seen in the axillary region, hilum or mediastinum. Lungs/Pleura: Right lung is without consolidation, pneumothorax or effusion. There are areas of interstitial septal thickening with scarring and fibrotic changes. Surgical changes as well along the middle lobe anterior inferiorly. Small area of ground-glass in the anterior  right lower lobe on the prior examination which measured 19 x 14 mm with a central more confluence 4 mm nodular area today has a nodular areas seen on series 8, image 74 measuring 5 mm in the surrounding ground-glass measuring 19 by 11 mm. Posteriorly in the right lower lobe previously there was a 4 mm nodule, stable today on series 8, image 63. A few other areas of ground-glass changes in the right lung postsurgical changes of right upper lobectomy. Left lung has some volume loss. There are areas of peripheral interstitial septal thickening and fibrotic changes along the left lung base with some component of atelectasis. Surgical changes once again along the lateral left upper lobe with masslike area extending out to the pleura. Dimension on the prior examination of 3.5 x 2.3 cm. Today when measured in the similar fashion on series 8, image 58 would measure 3.7 x 2.4 cm. Adjacent pleural thickening and adjacent rib deformities. Upper Abdomen: In the upper abdomen the adrenal glands are preserved. Splenule noted at the edge of the imaging field. Fatty liver infiltration. Musculoskeletal: Curvature of the spine identified. Old left-sided healed rib fractures incompletely healed fractures. Posterior right-sided healed rib fractures. Scattered degenerative changes. There is a area of sclerosis with compression fracture involving the superior endplate of [id number] is new from the prior CT scan. This could be a metastatic lesion with pathologic fracture of the anterior superior endplate. Please correlate for any known history. Dedicated workup otherwise when appropriate such as MRI and whole-body bone scan. IMPRESSION: Developing area of sclerosis involving the anterior superior aspect of T12 vertebral body with cortical deformity. This could represent a fracture with an underlying bony metastatic lesion. Recommend further workup with whole-body bone scan and possible thoracic spine MRI please correlate with clinical  findings. Otherwise stable appearance of the chest with surgical changes from right upper lobectomy and left upper lobe resection. The ill-defined masslike area in the left upper lobe in the other areas of ground-glass and small solid nodularity in the right lung are similar compared to the prior examination. Recommend continued surveillance. No new developing lymph node enlargement. Fatty liver infiltration. Hiatal hernia. Aortic Atherosclerosis (ICD10-I70.0). Radiology physician assistant team will relay the findings to the ordering service Electronically Signed   By: AJill SideM.D.   On: 11/07/2022 14:48     ASSESSMENT AND PLAN: This is a very pleasant 82years old white female with history of bilateral stage Ia non-small cell lung cancer involving the left upper lobe as  well as the right upper lobe status post wedge resection of the 2 lesions in 2011 under the care of Dr. Arlyce Dice.  The patient also received adjuvant systemic chemotherapy for 6 cycles at [place] [facility] with carboplatin, Alimta and Avastin.  She also received stereotactic radiotherapy to the enlarging left upper lobe lung nodule in 2014.  The patient is currently on observation and she is feeling fine with no concerning complaints except for the baseline shortness of breath and chronic back pain. She had repeat CT scan of the chest performed recently.  I personally and independently reviewed the scan and discussed the result with the patient today. Her scan showed no concerning findings for disease recurrence or metastasis but she had developing areas of sclerosis involving the anterior superior aspect of T12 vertebral body with cortical deformity that could represent a fracture or underlying bone metastasis but the patient mentions that she had a history of several falls in the past. Recommended for her to continue on observation with repeat CT scan of the chest in 1 year.  She was advised to call immediately if she has any  other concerning symptoms in the interval.  The patient voices understanding of current disease status and treatment options and is in agreement with the current care plan.  All questions were answered. The patient knows to call the clinic with any problems, questions or concerns. We can certainly see the patient much sooner if necessary.   Disclaimer: This note was dictated with voice recognition software. Similar sounding words can inadvertently be transcribed and may not be corrected upon review.

## 2022-11-09 DIAGNOSIS — Z7409 Other reduced mobility: Secondary | ICD-10-CM | POA: Diagnosis not present

## 2022-11-09 DIAGNOSIS — R2681 Unsteadiness on feet: Secondary | ICD-10-CM | POA: Diagnosis not present

## 2022-11-10 ENCOUNTER — Encounter: Payer: Self-pay | Admitting: Family Medicine

## 2022-11-13 ENCOUNTER — Other Ambulatory Visit: Payer: Self-pay | Admitting: Family Medicine

## 2022-11-13 MED ORDER — DOXEPIN HCL 25 MG PO CAPS: 25.0000 mg | ORAL_CAPSULE | Freq: Every evening | ORAL | 5 refills | Status: DC | PRN

## 2022-11-14 DIAGNOSIS — R2681 Unsteadiness on feet: Secondary | ICD-10-CM | POA: Diagnosis not present

## 2022-11-14 DIAGNOSIS — Z7409 Other reduced mobility: Secondary | ICD-10-CM | POA: Diagnosis not present

## 2022-11-16 DIAGNOSIS — R2681 Unsteadiness on feet: Secondary | ICD-10-CM | POA: Diagnosis not present

## 2022-11-16 DIAGNOSIS — Z7409 Other reduced mobility: Secondary | ICD-10-CM | POA: Diagnosis not present

## 2022-11-21 DIAGNOSIS — R2681 Unsteadiness on feet: Secondary | ICD-10-CM | POA: Diagnosis not present

## 2022-11-21 DIAGNOSIS — Z7409 Other reduced mobility: Secondary | ICD-10-CM | POA: Diagnosis not present

## 2022-11-23 DIAGNOSIS — Z7409 Other reduced mobility: Secondary | ICD-10-CM | POA: Diagnosis not present

## 2022-11-23 DIAGNOSIS — R2681 Unsteadiness on feet: Secondary | ICD-10-CM | POA: Diagnosis not present

## 2022-11-29 DIAGNOSIS — Z7409 Other reduced mobility: Secondary | ICD-10-CM | POA: Diagnosis not present

## 2022-11-29 DIAGNOSIS — R2681 Unsteadiness on feet: Secondary | ICD-10-CM | POA: Diagnosis not present

## 2022-12-01 DIAGNOSIS — R2681 Unsteadiness on feet: Secondary | ICD-10-CM | POA: Diagnosis not present

## 2022-12-01 DIAGNOSIS — Z7409 Other reduced mobility: Secondary | ICD-10-CM | POA: Diagnosis not present

## 2022-12-12 DIAGNOSIS — Z7409 Other reduced mobility: Secondary | ICD-10-CM | POA: Diagnosis not present

## 2022-12-12 DIAGNOSIS — R2681 Unsteadiness on feet: Secondary | ICD-10-CM | POA: Diagnosis not present

## 2022-12-15 DIAGNOSIS — R2681 Unsteadiness on feet: Secondary | ICD-10-CM | POA: Diagnosis not present

## 2022-12-15 DIAGNOSIS — Z7409 Other reduced mobility: Secondary | ICD-10-CM | POA: Diagnosis not present

## 2022-12-20 DIAGNOSIS — R2681 Unsteadiness on feet: Secondary | ICD-10-CM | POA: Diagnosis not present

## 2022-12-20 DIAGNOSIS — Z7409 Other reduced mobility: Secondary | ICD-10-CM | POA: Diagnosis not present

## 2022-12-23 NOTE — Progress Notes (Deleted)
Pulmonary Consult    Self referral. She has seen Dr. Delton Coombes in the past- last in 2012. She has been seeing Dr. Katrine Coho at [facility] and is here today to re-est care. She c/o DOE with cleaning her house or taking out the trash. She is on O2 with sleep at 2lpm. She uses proiar 1 x per wk on average and has a neb with albuterol that she rarely uses.   doe x MMRC2 = can't walk a nl pace on a flat grade s sob but does fine slow and flat eg shopping at slow pace / can't do ramps Doe Never changed since empyema despite having RT and chemo for BAC dx 09/14/09 at [facility] with [facility] 11/2009 and persistent bilateral MPN's under surveillance at [facility] but no recent bx's or further chemo. ========================================================================================  09/25/22- 81yoF former smoker, NOW Vaping, with COPD, O2 2L for sleep, hx /rul lobectomy for BAC/ chemo/ RT 2011, multiple pulmonary nodules, CAD,  Last saw Dr Sherene Sires 07/2022. O2 3L  Medical problem list PVCs, HTN, hx Mitral Prolapse, Allergic Rhinitis, GERD, Hypothyroid, Osteopenia/ hx Aseptic Necrosis, Polycythemia,  - Ventolin hfA  Trelegy 100,    Neb Duoneb Had COVID infection in late December treated with Paxlovid. Has had COVID, RSV, flu vaccines. Gradually more dyspnea on exertion over the past year without sudden event other than acute illness in late December.  Using oxygen 3 L at night and POC during the day.  Oxygen saturation drops quickly with exertion but okay on room air at rest. Coughing yellow-green mucus.  Home nebulizer with DuoNeb does help. Has done pulmonary rehabilitation 3 times. Husband recently died CVA. CXR 2022-07-30- MPRESSION: Chronic changes/treatment changes, without evidence of acute cardiopulmonary disease CT chest 11/08/21- MPRESSION: 1. Stable examination demonstrating postoperative changes of right upper lobectomy and left upper lobe wedge resection with chronic postradiation mass-like fibrosis in the  left upper lobe. Small pulmonary nodules in the lungs bilaterally appear stable in number and size compared to prior examinations, favored to be benign. 2. Aortic atherosclerosis, in addition to 2 vessel coronary artery disease. Assessment for potential risk factor modification, dietary therapy or pharmacologic therapy may be warranted, if clinically indicated. 3. Hepatic steatosis. Aortic Atherosclerosis (ICD10-I70.0).  12/26/22-  81yoF former smoker, NOW Vaping, with COPD, O2 2L for sleep, hx /rul lobectomy for BAC/ chemo/ RT 2011, multiple pulmonary nodules, CAD,  PVCs, HTN, hx Mitral Prolapse, Allergic Rhinitis, GERD, Hypothyroid, Osteopenia/ hx Aseptic Necrosis, Polycythemia, Covid infection,  Last saw Dr Sherene Sires 07/2022. O2 3L sleep, POC/  - Ventolin hfA  Trelegy 100,    Neb Duoneb        Doxepin 25 for sleep- Dr Tanya Nones Still followed by Oncology w/o recurrence.  CT chest 11/08/22- by Oncology IMPRESSION: Developing area of sclerosis involving the anterior superior aspect of T12 vertebral body with cortical deformity. This could represent a fracture with an underlying bony metastatic lesion. Recommend further workup with whole-body bone scan and possible thoracic spine MRI please correlate with clinical findings.   Otherwise stable appearance of the chest with surgical changes from right upper lobectomy and left upper lobe resection. The ill-defined masslike area in the left upper lobe in the other areas of ground-glass and small solid nodularity in the right lung are similar compared to the prior examination. Recommend continued surveillance.   No new developing lymph node enlargement.   Fatty liver infiltration.   Hiatal hernia.   Aortic Atherosclerosis (ICD10-I70.0).    ROS-see HPI   + =  positive Constitutional:    weight loss, night sweats, fevers, chills, fatigue, lassitude. HEENT:    headaches, difficulty swallowing, tooth/dental problems, sore throat,        sneezing, itching, ear ache, nasal congestion, post nasal drip, snoring CV:    chest pain, orthopnea, PND, swelling in lower extremities, anasarca,                                   dizziness, palpitations Resp:   shortness of breath with exertion or at rest.                +productive cough,   non-productive cough, coughing up of blood.              +change in color of mucus.  wheezing.   Skin:    rash or lesions. GI:  No-   heartburn, indigestion, abdominal pain, nausea, vomiting, diarrhea,                 change in bowel habits, loss of appetite GU: dysuria, change in color of urine, no urgency or frequency.   flank pain. MS:   joint pain, stiffness, decreased range of motion, back pain. Neuro-     nothing unusual Psych:  change in mood or affect.  depression or anxiety.   memory loss.  OBJ- Physical Exam General- Alert, Oriented, Affect-appropriate, Distress- none acute Skin- rash-none, lesions- none, excoriation- none Lymphadenopathy- none Head- atraumatic            Eyes- Gross vision intact, PERRLA, conjunctivae and secretions clear            Ears- Hearing, canals-normal            Nose- Clear, no-Septal dev, mucus, polyps, erosion, perforation             Throat- Mallampati II , mucosa clear , drainage- none, tonsils- atrophic Neck- flexible , trachea midline, no stridor , thyroid nl, carotid no bruit Chest - symmetrical excursion , unlabored           Heart/CV- RRR , no murmur , no gallop  , no rub, nl s1 s2                           - JVD- none , edema- none, stasis changes- none, varices- none           Lung- clear to P&A, wheeze- none, cough- none , dullness-none, rub- none           Chest wall-  Abd-  Br/ Gen/ Rectal- Not done, not indicated Extrem- cyanosis- none, clubbing, none, atrophy- none, strength- nl Neuro- grossly intact to observation

## 2022-12-25 ENCOUNTER — Other Ambulatory Visit: Payer: Self-pay

## 2022-12-25 DIAGNOSIS — J449 Chronic obstructive pulmonary disease, unspecified: Secondary | ICD-10-CM

## 2022-12-25 DIAGNOSIS — J441 Chronic obstructive pulmonary disease with (acute) exacerbation: Secondary | ICD-10-CM

## 2022-12-26 ENCOUNTER — Ambulatory Visit: Payer: Medicare Other | Admitting: Internal Medicine

## 2022-12-27 DIAGNOSIS — Z7409 Other reduced mobility: Secondary | ICD-10-CM | POA: Diagnosis not present

## 2022-12-27 DIAGNOSIS — R2681 Unsteadiness on feet: Secondary | ICD-10-CM | POA: Diagnosis not present

## 2022-12-29 DIAGNOSIS — R2681 Unsteadiness on feet: Secondary | ICD-10-CM | POA: Diagnosis not present

## 2022-12-29 DIAGNOSIS — Z7409 Other reduced mobility: Secondary | ICD-10-CM | POA: Diagnosis not present

## 2023-01-03 ENCOUNTER — Encounter: Payer: Self-pay | Admitting: Internal Medicine

## 2023-01-03 DIAGNOSIS — Z7409 Other reduced mobility: Secondary | ICD-10-CM | POA: Diagnosis not present

## 2023-01-03 DIAGNOSIS — R2681 Unsteadiness on feet: Secondary | ICD-10-CM | POA: Diagnosis not present

## 2023-01-05 DIAGNOSIS — R2681 Unsteadiness on feet: Secondary | ICD-10-CM | POA: Diagnosis not present

## 2023-01-05 DIAGNOSIS — Z7409 Other reduced mobility: Secondary | ICD-10-CM | POA: Diagnosis not present

## 2023-01-09 DIAGNOSIS — R2681 Unsteadiness on feet: Secondary | ICD-10-CM | POA: Diagnosis not present

## 2023-01-09 DIAGNOSIS — Z7409 Other reduced mobility: Secondary | ICD-10-CM | POA: Diagnosis not present

## 2023-01-19 DIAGNOSIS — R2681 Unsteadiness on feet: Secondary | ICD-10-CM | POA: Diagnosis not present

## 2023-01-19 DIAGNOSIS — Z7409 Other reduced mobility: Secondary | ICD-10-CM | POA: Diagnosis not present

## 2023-01-24 DIAGNOSIS — R2681 Unsteadiness on feet: Secondary | ICD-10-CM | POA: Diagnosis not present

## 2023-01-24 DIAGNOSIS — Z7409 Other reduced mobility: Secondary | ICD-10-CM | POA: Diagnosis not present

## 2023-01-26 DIAGNOSIS — Z7409 Other reduced mobility: Secondary | ICD-10-CM | POA: Diagnosis not present

## 2023-01-26 DIAGNOSIS — R2681 Unsteadiness on feet: Secondary | ICD-10-CM | POA: Diagnosis not present

## 2023-01-31 ENCOUNTER — Telehealth: Payer: Self-pay | Admitting: Pharmacist

## 2023-01-31 DIAGNOSIS — R2681 Unsteadiness on feet: Secondary | ICD-10-CM | POA: Diagnosis not present

## 2023-01-31 DIAGNOSIS — Z7409 Other reduced mobility: Secondary | ICD-10-CM | POA: Diagnosis not present

## 2023-01-31 DIAGNOSIS — J449 Chronic obstructive pulmonary disease, unspecified: Secondary | ICD-10-CM

## 2023-01-31 NOTE — Telephone Encounter (Signed)
PharmD reviewed patient chart to assess eligibility for [facility] Care Management and Coordination services. Patient was determined to be a good candidate for the program given the complexity of the medication regimen and/or overall risk for hospitalization and increased utilization.  Referral entered in order to outreach patient and offer appointment with PharmD. Referral cosigned to PCP.   Shekinah Pitones, PharmD, CPP Clinical Pharmacist Practitioner [facility] [phone]  

## 2023-02-02 DIAGNOSIS — Z7409 Other reduced mobility: Secondary | ICD-10-CM | POA: Diagnosis not present

## 2023-02-02 DIAGNOSIS — R2681 Unsteadiness on feet: Secondary | ICD-10-CM | POA: Diagnosis not present

## 2023-02-06 ENCOUNTER — Telehealth: Payer: Self-pay | Admitting: Pharmacist

## 2023-02-06 NOTE — Progress Notes (Unsigned)
Care Management & Coordination Services Pharmacy Team   Reason for Encounter: Chart Prep for initial visit on 02/08/23   Contacted patient to confirm in office appointment with Erskine Emery, PharmD on 02/08/23 at 11AM. {US HC Outreach:[id number]}  Do you have any problems getting your medications? {yes/no:20286} If yes what types of problems are you experiencing? {Problems:27223}  What is your top health concern you would like to discuss at your upcoming visit?   Have you seen any other providers since your last visit with PCP? {yes/no:20286}   Chart review:  Recent office visits:  08/17/22 Annual Medicare Wellness Completed  Recent consult visits:  11/08/22 [name] MD - Oncology - Lung CA - Labs were ordered. CT chest with contrast ordered. Follow up in 1 year.  09/25/22 Jetty Duhamel, MD - Pulmonology - COPD - azithromycin (ZITHROMAX) 250 MG tablet prescribed. Follow up as scheduled.   Hospital visits:  None in previous 6 months   Star Rating Drugs:  Medication:   Last Fill: Day Supply  rosuvastatin 20 MG tablet    09/24/22 90  losartan-hctz 100-25mg  tablet  10/14/22 90  Care Gaps: Annual wellness visit in last year? Yes done 08/17/22  If Diabetic: Last eye exam / retinopathy screening: Last diabetic foot exam:   Future Appointments  Date Time Provider Department Center  02/08/2023 11:00 AM Erroll Luna, [place] [facility] None  08/28/2023  3:00 PM [facility]-ANNUAL WELLNESS VISIT [facility]  11/06/2023 12:00 PM [facility] None  11/08/2023  1:30 PM Si Gaul, MD [facility] None    [employer], [facility]

## 2023-02-08 ENCOUNTER — Encounter: Payer: Medicare Other | Admitting: Pharmacist

## 2023-02-08 ENCOUNTER — Encounter: Payer: Self-pay | Admitting: Family Medicine

## 2023-02-13 ENCOUNTER — Other Ambulatory Visit: Payer: Self-pay

## 2023-02-13 DIAGNOSIS — R2689 Other abnormalities of gait and mobility: Secondary | ICD-10-CM

## 2023-02-13 DIAGNOSIS — M542 Cervicalgia: Secondary | ICD-10-CM

## 2023-02-13 DIAGNOSIS — M5136 Other intervertebral disc degeneration, lumbar region: Secondary | ICD-10-CM

## 2023-02-15 ENCOUNTER — Ambulatory Visit: Payer: Medicare Other | Admitting: Pharmacist

## 2023-02-15 NOTE — Progress Notes (Signed)
Care Management & Coordination Services Pharmacy Note  02/15/2023 Name:  [name] MRN:  [id number] DOB:  1941/07/19  Summary: PharmD Initial visit.  Patient doing well controlling chronic conditions.  She has started back on Rosuvastatin 20mg , has not had updated lipids since starting back.  Had her schedule a visit in July for physical and updated labs.  Reviewed DEXA from 2022.  T score-2.1 and FRAX risk for hip 3.3%.  Due to fall risk I feel she may be a good candidate for treatment which is appropriate based on hip fracture risk > 3%  Recommendations/Changes made from today's visit: Fosamax 70mg  once weekly Review labs at July physical  Follow up plan: CMA to FU in 2-4 weeks on Fosamax CMA to review July labs and report to me FU with me in 3 months   Subjective: [name] is an 82 y.o. year old female who is a primary patient of [name].  The care coordination team was consulted for assistance with disease management and care coordination needs.    Engaged with patient by telephone for initial visit.  Recent office visits:  08/17/22 Annual Medicare Wellness Completed   Recent consult visits:  11/08/22 [name] [name] - Oncology - Lung CA - Labs were ordered. CT chest with contrast ordered. Follow up in 1 year.   09/25/22 Jetty Duhamel, [name] - Pulmonology - COPD - azithromycin (ZITHROMAX) 250 MG tablet prescribed. Follow up as scheduled.    Hospital visits:  None in previous 6 months   Objective:  Lab Results  Component Value Date   CREATININE 0.78 11/06/2022   BUN 14 11/06/2022   GFR 83.91 06/29/2017   EGFR 40 (L) 07/06/2022   GFRNONAA >60 11/06/2022   GFRAA 87 07/15/2020   NA 139 11/06/2022   K 4.1 11/06/2022   CALCIUM 9.0 11/06/2022   CO2 30 11/06/2022   GLUCOSE 95 11/06/2022    Lab Results  Component Value Date/Time   HGBA1C 5.9 (H) 10/13/2021 11:41 AM   HGBA1C 5.3 05/21/2018 04:16 PM   GFR 83.91 06/29/2017 10:04  AM    Last diabetic Eye exam:  Lab Results  Component Value Date/Time   HMDIABEYEEXA No Retinopathy 07/10/2018 12:00 AM    Last diabetic Foot exam: No results found for: "HMDIABFOOTEX"   Lab Results  Component Value Date   CHOL 279 (H) 06/08/2022   HDL 55 06/08/2022   LDLCALC  06/08/2022     Comment:     . LDL cholesterol not calculated. Triglyceride levels greater than 400 mg/dL invalidate calculated LDL results. . Reference range: <100 . Desirable range <100 mg/dL for primary prevention;   <70 mg/dL for patients with CHD or diabetic patients  with > or = 2 CHD risk factors. Marland Kitchen LDL-C is now calculated using the Martin-Hopkins  calculation, which is a validated novel method providing  better accuracy than the Friedewald equation in the  estimation of LDL-C.  Horald Pollen et al. Lenox Ahr. 4696;295(28): 2061-2068  (http://education.[employer].com/faq/FAQ164)    TRIG 474 (H) 06/08/2022   CHOLHDL 5.1 (H) 06/08/2022       Latest Ref Rng & Units 11/06/2022    2:09 PM 07/06/2022   10:40 AM 06/15/2022    2:12 PM  Hepatic Function  Total Protein 6.5 - 8.1 g/dL 6.8  6.6  6.6   Albumin 3.5 - 5.0 g/dL 4.2     AST 15 - 41 U/L 28  21  19    ALT 0 - 44 U/L  24  15  20    Alk Phosphatase 38 - 126 U/L 68     Total Bilirubin 0.3 - 1.2 mg/dL 0.4  0.5  0.5     Lab Results  Component Value Date/Time   TSH 2.34 06/15/2022 02:12 PM   TSH 1.23 06/08/2022 11:41 AM       Latest Ref Rng & Units 11/06/2022    2:09 PM 07/06/2022   10:40 AM 06/08/2022   11:41 AM  CBC  WBC 4.0 - 10.5 K/uL 8.6  8.5  9.0   Hemoglobin 12.0 - 15.0 g/dL 16.1  09.6  04.5   Hematocrit 36.0 - 46.0 % 40.0  41.2  45.3   Platelets 150 - 400 K/uL 264  283  318     No results found for: "VD25OH", "VITAMINB12"  Clinical ASCVD: No  The ASCVD Risk score (Arnett DK, et al., 2019) failed to calculate for the following reasons:   The 2019 ASCVD risk score is only valid for ages 63 to 36        08/17/2022    4:20 PM  07/06/2022   10:19 AM 04/05/2021   12:10 PM  Depression screen PHQ 2/9  Decreased Interest 1 0 3  Down, Depressed, Hopeless 0 1 3  PHQ - 2 Score 1 1 6   Altered sleeping 3  3  Tired, decreased energy 2  3  Change in appetite   3  Feeling bad or failure about yourself  0  3  Trouble concentrating 1  3  Moving slowly or fidgety/restless   3  Suicidal thoughts 0  0  PHQ-9 Score 7  24  Difficult doing work/chores   Very difficult     Social History   Tobacco Use  Smoking Status Former   Packs/day: 0.75   Years: 30.00   Additional pack years: 0.00   Total pack years: 22.50   Types: Cigarettes   Quit date: 09/11/2004   Years since quitting: 18.4  Smokeless Tobacco Never  Tobacco Comments   Pt is vaping x 1.5 years.  Contains nicotine   BP Readings from Last 3 Encounters:  11/08/22 (!) 142/77  09/25/22 122/80  07/28/22 124/72   Pulse Readings from Last 3 Encounters:  11/08/22 88  09/25/22 96  07/28/22 74   Wt Readings from Last 3 Encounters:  11/08/22 196 lb 1 oz (88.9 kg)  08/17/22 196 lb (88.9 kg)  07/28/22 196 lb 9.6 oz (89.2 kg)   BMI Readings from Last 3 Encounters:  11/08/22 31.65 kg/m  08/17/22 31.64 kg/m  07/28/22 31.26 kg/m    Allergies  Allergen Reactions   Sulfonamide Derivatives Hives   Amoxicillin Diarrhea    Diarrhea, doesnt tolerate    Doxycycline Nausea Only   Erythromycin Nausea Only    Medications Reviewed Today     Reviewed by Waymon Budge, [name] (Physician) on 11/01/22 at 1113  Med List Status: <None>   Medication Order Taking? Sig Documenting Provider Last Dose Status Informant  acetaminophen (TYLENOL) 325 MG tablet [id number] Yes Take by mouth. Provider, Historical, [name] Taking Active            Med Note Suzie Portela, [place]   Fri Jul 28, 2016  2:06 PM) Received from: [facility] Received Sig: Take 650 mg by mouth every 4 (four) hours as needed for Pain.  albuterol (VENTOLIN HFA) 108 (90 Base) MCG/ACT inhaler [id number]  Yes Inhale 1 puff into the lungs every 6 (six) hours as needed for  wheezing or shortness of breath. Provider, Historical, [name] Taking Active   azithromycin (ZITHROMAX) 250 MG tablet [id number] Yes 2 today then one daily Young, Clinton D, [name]  Active   Calcium Carbonate-Vit D-Min (CALCIUM 1200 PO) [id number] Yes Take 50 mg by mouth. ONCE A WEEK Provider, Historical, [name] Taking Active   co-enzyme Q-10 30 MG capsule [id number] Yes Take 100 mg by mouth 3 (three) times daily. Provider, Historical, [name] Taking Active   Cyanocobalamin 1000 MCG CAPS [id number] Yes Take by mouth. Provider, Historical, [name] Taking Active            Med Note Anne Hahn, SANDY B   Tue May 21, 2018  3:02 PM)    doxazosin (CARDURA) 4 MG tablet [id number] Yes TAKE 1 TABLET DAILY Donita Brooks, [name] Taking Active   doxepin (SINEQUAN) 25 MG capsule [id number]  Take 1 capsule (25 mg total) by mouth at bedtime as needed. Donita Brooks, [name]  Active   FLUoxetine (PROZAC) 20 MG tablet [id number] Yes Take 2 tablets (40 mg total) by mouth daily. Stop duloxetine Donita Brooks, [name] Taking Active   fluticasone [facility]) 50 MCG/ACT nasal spray [id number] Yes USE 2 SPRAYS IN EACH NOSTRIL DAILY Donita Brooks, [name] Taking Active   Fluticasone-Umeclidin-Vilant (TRELEGY ELLIPTA) 100-62.5-25 MCG/ACT AEPB [id number] Yes Inhale 1 puff into the lungs daily. Noemi Chapel, NP Taking Active   gabapentin (NEURONTIN) 100 MG capsule [id number]  Take 1 capsule (100 mg total) by mouth 3 (three) times daily. Donita Brooks, [name]  Consider Medication Status and Discontinue   levothyroxine (SYNTHROID) 112 MCG tablet [id number] Yes TAKE 1 TABLET DAILY (DISCONTINUE LEVOTHYROXINE 100) Donita Brooks, [name] Taking Active   losartan-hydrochlorothiazide [facility]) 100-25 MG tablet [id number] Yes TAKE 1 TABLET DAILY Donita Brooks, [name] Taking Active   meclizine (ANTIVERT) 25 MG tablet [id number] Yes TAKE 1 TABLET BY MOUTH 3 TIMES DAILY AS NEEDED FOR DIZZINESS. Donita Brooks,  [name] Taking Active   Multiple Vitamins-Minerals (EMERGEN-C FIVE PO) [id number]  Take by mouth. Provider, Historical, [name]  Consider Medication Status and Discontinue   omeprazole (PRILOSEC) 20 MG capsule [id number]  Take 1 capsule (20 mg total) by mouth daily. Donita Brooks, [name]  Consider Medication Status and Discontinue   OXYGEN [id number] Yes 2lpm with sleep Provider, Historical, [name] Taking Active   rosuvastatin (CRESTOR) 20 MG tablet [id number] Yes Take 1 tablet (20 mg total) by mouth daily. Donita Brooks, [name] Taking Active   thiamine (VITAMIN B-1) 100 MG tablet [id number] Yes Take 100 mg by mouth daily. Provider, Historical, [name] Taking Active             SDOH:  (Social Determinants of Health) assessments and interventions performed: Yes Financial Resource Strain: Low Risk  (08/17/2022)   Overall Financial Resource Strain (CARDIA)    Difficulty of Paying Living Expenses: Not hard at all   Food Insecurity: No Food Insecurity (08/17/2022)   Hunger Vital Sign    Worried About Running Out of Food in the Last Year: Never true    Ran Out of Food in the Last Year: Never true    SDOH Interventions    Flowsheet Row Clinical Support from 08/17/2022 in [facility] Clinical Support from 02/17/2021 in [facility] Office Visit from 11/12/2018 in [facility]  SDOH Interventions     Food Insecurity Interventions Intervention Not Indicated Intervention Not Indicated --  Housing Interventions Intervention Not Indicated Intervention Not Indicated --  Transportation Interventions Intervention Not Indicated Intervention Not Indicated --  Utilities Interventions Intervention Not Indicated -- --  Alcohol Usage Interventions Intervention Not Indicated (Score <7) -- --  Depression Interventions/Treatment  Medication Medication, Currently on Treatment Currently on Treatment  Financial Strain Interventions Intervention Not Indicated Intervention Not  Indicated --  Physical Activity Interventions Intervention Not Indicated Intervention Not Indicated --  Stress Interventions Intervention Not Indicated Intervention Not Indicated --  Social Connections Interventions Intervention Not Indicated Intervention Not Indicated --       Medication Assistance: None required.  Patient affirms current coverage meets needs.  Medication Access: Name and location of current pharmacy:  [facility] - [place], [place] - [address] [place] [address] Phone: [phone] Fax: [phone]  [id number] [place], [place] - [address] [place] [address] Phone: [phone] Fax: [phone]  Within the past 30 days, how often has patient missed a dose of medication? 0 Is a pillbox or other method used to improve adherence? Yes  Factors that may affect medication adherence? no barriers identified Are meds synced by current pharmacy? No  Are meds delivered by current pharmacy? No  Does patient experience delays in picking up medications due to transportation concerns? No   Compliance/Adherence/Medication fill history: Star Rating Drugs:  Medication:                            Last Fill:         Day Supply   rosuvastatin 20 MG tablet         09/24/22          90  losartan-hctz 100-25mg  tablet  10/14/22            90   Care Gaps: Annual wellness visit in last year? Yes done 08/17/22   Assessment/Plan   Hyperlipidemia: (LDL goal < 100) -Uncontrolled -Current treatment: Rosuvastatin 20mg  Appropriate, Query effective, ,  -Medications previously tried: none noted  -Educated on Cholesterol goals;  Benefits of statin for ASCVD risk reduction; Importance of limiting foods high in cholesterol; - She is taking her statin daily despite adherence showing not filled since 09/24/22.  Reports when her husband died she sort of stopped taking her  meds.  She is now back on them but had a large supply.  Confirmed taking daily - advised her to schedule physical as she is due for follow up labs.  If LDL still elevated would need to increase dose to Crestor 40mg .  Looks like physical is scheduled for Mid July - will have CMA FU after this and report labs.  COPD (Goal: control symptoms and prevent exacerbations) -Controlled -Current treatment  Albuterol HFA prn Appropriate, Effective, Safe, Accessible Trelegy 1 puff daily Appropriate, Effective, Safe, Accessible -Medications previously tried: none noted  -Pulmonary function testing: Pulmonary Functions Testing Results:  TLC  Date Value Ref Range Status  07/19/2018 4.78 L Final    -Exacerbations requiring treatment in last 6 months: none -Patient reports consistent use of maintenance inhaler -Frequency of rescue inhaler use: infrequent -Counseled on Proper inhaler technique; Benefits of consistent maintenance inhaler use When to use rescue inhaler Differences between maintenance and rescue inhalers -Recommended to continue current medication  Osteopenia (Goal: Prevent fracture) -Uncontrolled -Current treatment  Calcium 1200mg  daily Appropriate, Query effective, ,  Vitamin D 5000 IU daily Appropriate, Query effective, ,  -  Medications previously tried: none ntoed  - Last DEXA score was in 08/2018.   T-score at forearm was -2.1.  T score at R femur neck was -1.3. Major Osteoporotic Fracture: 17.4% Hip Fracture:                3.3% -Patient not treated at that time, osteopenia was not added to problem list. -Based on hip fracture risk and T-score patient could be treated.  Due to fall history this may be worth starting even before next bone density scan.  Could possibly get DEXA approved for earlier.  Will discuss with PCP. Would recommend Alendronate 70mg  once weekly for treatment.   Med Eval   -Current treatment  Fluoxetine 20mg  Appropriate, Effective, Safe,  Accessible Levothyroxine 112 mcg Appropriate, Effective, Safe, Accessible Losartan/HCTZ 100-25mg  Appropriate, Effective, Safe, Accessible Meclizine 25mg  Appropriate, Effective, Safe, Accessible    Willa Frater, PharmD, CPP Clinical Pharmacist Practitioner [facility] [phone]

## 2023-02-20 ENCOUNTER — Other Ambulatory Visit: Payer: Self-pay

## 2023-02-20 ENCOUNTER — Encounter: Payer: Self-pay | Admitting: Family Medicine

## 2023-02-20 DIAGNOSIS — J449 Chronic obstructive pulmonary disease, unspecified: Secondary | ICD-10-CM

## 2023-02-20 MED ORDER — TRELEGY ELLIPTA 100-62.5-25 MCG/ACT IN AEPB: 1.0000 | INHALATION_SPRAY | Freq: Every day | RESPIRATORY_TRACT | 5 refills | Status: DC

## 2023-02-22 ENCOUNTER — Encounter: Payer: Self-pay | Admitting: Family Medicine

## 2023-02-27 DIAGNOSIS — L814 Other melanin hyperpigmentation: Secondary | ICD-10-CM | POA: Diagnosis not present

## 2023-02-27 DIAGNOSIS — L570 Actinic keratosis: Secondary | ICD-10-CM | POA: Diagnosis not present

## 2023-03-19 ENCOUNTER — Other Ambulatory Visit: Payer: Medicare Other

## 2023-03-19 DIAGNOSIS — E039 Hypothyroidism, unspecified: Secondary | ICD-10-CM | POA: Diagnosis not present

## 2023-03-19 DIAGNOSIS — I10 Essential (primary) hypertension: Secondary | ICD-10-CM

## 2023-03-19 DIAGNOSIS — E7800 Pure hypercholesterolemia, unspecified: Secondary | ICD-10-CM | POA: Diagnosis not present

## 2023-03-20 LAB — CBC WITH DIFFERENTIAL/PLATELET
Absolute Monocytes: 851 cells/uL (ref 200–950)
Basophils Absolute: 50 cells/uL (ref 0–200)
Basophils Relative: 0.5 %
Eosinophils Absolute: 178 cells/uL (ref 15–500)
Eosinophils Relative: 1.8 %
HCT: 43.8 % (ref 35.0–45.0)
Hemoglobin: 14.7 g/dL (ref 11.7–15.5)
Lymphs Abs: 2861 cells/uL (ref 850–3900)
MCH: 32.5 pg (ref 27.0–33.0)
MCHC: 33.6 g/dL (ref 32.0–36.0)
MCV: 96.9 fL (ref 80.0–100.0)
MPV: 10.8 fL (ref 7.5–12.5)
Monocytes Relative: 8.6 %
Neutro Abs: 5960 cells/uL (ref 1500–7800)
Neutrophils Relative %: 60.2 %
Platelets: 310 10*3/uL (ref 140–400)
RBC: 4.52 10*6/uL (ref 3.80–5.10)
RDW: 12.4 % (ref 11.0–15.0)
Total Lymphocyte: 28.9 %
WBC: 9.9 10*3/uL (ref 3.8–10.8)

## 2023-03-20 LAB — COMPLETE METABOLIC PANEL WITH GFR
AG Ratio: 2 (calc) (ref 1.0–2.5)
ALT: 19 U/L (ref 6–29)
AST: 21 U/L (ref 10–35)
Albumin: 4.5 g/dL (ref 3.6–5.1)
Alkaline phosphatase (APISO): 63 U/L (ref 37–153)
BUN: 17 mg/dL (ref 7–25)
CO2: 28 mmol/L (ref 20–32)
Calcium: 9.9 mg/dL (ref 8.6–10.4)
Chloride: 101 mmol/L (ref 98–110)
Creat: 0.82 mg/dL (ref 0.60–0.95)
Globulin: 2.3 g/dL (calc) (ref 1.9–3.7)
Glucose, Bld: 115 mg/dL — ABNORMAL HIGH (ref 65–99)
Potassium: 4 mmol/L (ref 3.5–5.3)
Sodium: 140 mmol/L (ref 135–146)
Total Bilirubin: 0.6 mg/dL (ref 0.2–1.2)
Total Protein: 6.8 g/dL (ref 6.1–8.1)
eGFR: 72 mL/min/{1.73_m2} (ref >=60)

## 2023-03-20 LAB — LIPID PANEL
Cholesterol: 170 mg/dL (ref <200)
HDL: 70 mg/dL (ref >=50)
LDL Cholesterol (Calc): 66 mg/dL (calc)
Non-HDL Cholesterol (Calc): 100 mg/dL (calc) (ref <130)
Total CHOL/HDL Ratio: 2.4 (calc) (ref <5.0)
Triglycerides: 260 mg/dL — ABNORMAL HIGH (ref <150)

## 2023-03-20 LAB — TSH: TSH: 7.22 mIU/L — ABNORMAL HIGH (ref 0.40–4.50)

## 2023-03-29 ENCOUNTER — Ambulatory Visit (INDEPENDENT_AMBULATORY_CARE_PROVIDER_SITE_OTHER): Payer: Medicare Other | Admitting: Family Medicine

## 2023-03-29 ENCOUNTER — Encounter: Payer: Self-pay | Admitting: Family Medicine

## 2023-03-29 VITALS — BP 118/62 | HR 79 | Temp 97.3°F | Ht 66.0 in | Wt 201.8 lb

## 2023-03-29 DIAGNOSIS — E039 Hypothyroidism, unspecified: Secondary | ICD-10-CM | POA: Diagnosis not present

## 2023-03-29 DIAGNOSIS — J449 Chronic obstructive pulmonary disease, unspecified: Secondary | ICD-10-CM

## 2023-03-29 DIAGNOSIS — Z0000 Encounter for general adult medical examination without abnormal findings: Secondary | ICD-10-CM

## 2023-03-29 DIAGNOSIS — I10 Essential (primary) hypertension: Secondary | ICD-10-CM

## 2023-03-29 MED ORDER — LEVOTHYROXINE SODIUM 125 MCG PO TABS: 125.0000 ug | ORAL_TABLET | Freq: Every day | ORAL | 3 refills | Status: DC

## 2023-03-29 MED ORDER — TRAZODONE HCL 50 MG PO TABS: 50.0000 mg | ORAL_TABLET | Freq: Every day | ORAL | 3 refills | Status: DC

## 2023-03-29 NOTE — Progress Notes (Signed)
Subjective:   Patient presents today for CPE.  She lost her husband in the last year. Patient has a remote history of lung cancer status post left upper lobe resection in 2011 for non-small cell lung cancer.  She has been cancer free ever since.  She has a history of COPD.  She also has a history of depression and anxiety and ADD.   Wt Readings from Last 3 Encounters:  03/29/23 201 lb 12.8 oz (91.5 kg)  11/08/22 196 lb 1 oz (88.9 kg)  08/17/22 196 lb (88.9 kg)  Patient has done remarkably well since I last saw her.  She is going to the senior center almost on a daily basis and getting exercise and also social interaction.  She has been under tremendous stress caring for her husband with dementia.  I believe that she is doing much better now that the stress level has improved.  She obviously misses her husband however it was very painful for her to watch him suffer as the dementia worsened.  Her mammogram is due in December.  Due to her age she does not require colonoscopy or Pap smear.  Her bone density test was done 1-1/2 years ago and showed osteopenia.  She is not due again until December 2025.  She denies any depression.  She is walking with a walker but seems to be getting around very well.  She had asked gait and good leg strength.  She uses the walker just when she has to walk prolonged distances because she "does not trust herself".  She does report some mild memory loss but she believes that this is normal for people her age.  She denies any concerns about the memory loss.  Her daughter checks on her daily and she also has a caregiver who is with her and help drive to and from locations. Lab on 03/19/2023  Component Date Value Ref Range Status   WBC 03/19/2023 9.9  3.8 - 10.8 Thousand/uL Final   RBC 03/19/2023 4.52  3.80 - 5.10 Million/uL Final   Hemoglobin 03/19/2023 14.7  11.7 - 15.5 g/dL Final   HCT 16/06/9603 43.8  35.0 - 45.0 % Final   MCV 03/19/2023 96.9  80.0 - 100.0 fL Final   MCH  03/19/2023 32.5  27.0 - 33.0 pg Final   MCHC 03/19/2023 33.6  32.0 - 36.0 g/dL Final   RDW 54/05/8118 12.4  11.0 - 15.0 % Final   Platelets 03/19/2023 310  140 - 400 Thousand/uL Final   MPV 03/19/2023 10.8  7.5 - 12.5 fL Final   Neutro Abs 03/19/2023 5,960  1,500 - 7,800 cells/uL Final   Lymphs Abs 03/19/2023 2,861  850 - 3,900 cells/uL Final   Absolute Monocytes 03/19/2023 851  200 - 950 cells/uL Final   Eosinophils Absolute 03/19/2023 178  15 - 500 cells/uL Final   Basophils Absolute 03/19/2023 50  0 - 200 cells/uL Final   Neutrophils Relative % 03/19/2023 60.2  % Final   Total Lymphocyte 03/19/2023 28.9  % Final   Monocytes Relative 03/19/2023 8.6  % Final   Eosinophils Relative 03/19/2023 1.8  % Final   Basophils Relative 03/19/2023 0.5  % Final   Glucose, Bld 03/19/2023 115 (H)  65 - 99 mg/dL Final   Comment: .            Fasting reference interval . For someone without known diabetes, a glucose value between 100 and 125 mg/dL is consistent with prediabetes and should be confirmed with  a follow-up test. .    BUN 03/19/2023 17  7 - 25 mg/dL Final   Creat 96/29/5284 0.82  0.60 - 0.95 mg/dL Final   eGFR 13/24/4010 72  > OR = 60 mL/min/1.72m2 Final   BUN/Creatinine Ratio 03/19/2023 SEE NOTE:  6 - 22 (calc) Final   Comment:    Not Reported: BUN and Creatinine are within    reference range. .    Sodium 03/19/2023 140  135 - 146 mmol/L Final   Potassium 03/19/2023 4.0  3.5 - 5.3 mmol/L Final   Chloride 03/19/2023 101  98 - 110 mmol/L Final   CO2 03/19/2023 28  20 - 32 mmol/L Final   Calcium 03/19/2023 9.9  8.6 - 10.4 mg/dL Final   Total Protein 27/25/3664 6.8  6.1 - 8.1 g/dL Final   Albumin 40/34/7425 4.5  3.6 - 5.1 g/dL Final   Globulin 95/63/8756 2.3  1.9 - 3.7 g/dL (calc) Final   AG Ratio 03/19/2023 2.0  1.0 - 2.5 (calc) Final   Total Bilirubin 03/19/2023 0.6  0.2 - 1.2 mg/dL Final   Alkaline phosphatase (APISO) 03/19/2023 63  37 - 153 U/L Final   AST 03/19/2023 21  10 -  35 U/L Final   ALT 03/19/2023 19  6 - 29 U/L Final   Cholesterol 03/19/2023 170  <200 mg/dL Final   HDL 43/32/9518 70  > OR = 50 mg/dL Final   Triglycerides 84/16/6063 260 (H)  <150 mg/dL Final   Comment: . If a non-fasting specimen was collected, consider repeat triglyceride testing on a fasting specimen if clinically indicated.  Perry Mount et al. J. of Clin. Lipidol. 2015;9:129-169. Marland Kitchen    LDL Cholesterol (Calc) 03/19/2023 66  mg/dL (calc) Final   Comment: Reference range: <100 . Desirable range <100 mg/dL for primary prevention;   <70 mg/dL for patients with CHD or diabetic patients  with > or = 2 CHD risk factors. Marland Kitchen LDL-C is now calculated using the Martin-Hopkins  calculation, which is a validated novel method providing  better accuracy than the Friedewald equation in the  estimation of LDL-C.  Horald Pollen et al. Lenox Ahr. 0160;109(32): 2061-2068  (http://education.[facility].com/faq/FAQ164)    Total CHOL/HDL Ratio 03/19/2023 2.4  <3.5 (calc) Final   Non-HDL Cholesterol (Calc) 03/19/2023 100  <130 mg/dL (calc) Final   Comment: For patients with diabetes plus 1 major ASCVD risk  factor, treating to a non-HDL-C goal of <100 mg/dL  (LDL-C of <57 mg/dL) is considered a therapeutic  option.    TSH 03/19/2023 7.22 (H)  0.40 - 4.50 mIU/L Final   She does complain of insomnia.  She states that her mind races at night and she is unable to sleep.  This has been a chronic problem for her  Past Medical History:  Diagnosis Date   ADD (attention deficit disorder)    Allergic rhinitis    Anemia    Anxiety    Arrhythmia    Arthritis    "hands, neck, right shoulder" (07/28/2015)   Aseptic necrosis (HCC)    Asthma dxc'd 05/2015   Basal cell cancer    Bleeding stomach ulcer ~ 06/2005   COPD (chronic obstructive pulmonary disease) (HCC)    Depression    Diverticula of colon    Dyspnea    Esophagitis    Gastric ulcer with hemorrhage    GERD (gastroesophageal reflux disease)     Heart murmur    History of blood transfusion 06/2005   "related to pneumonia"   History of  kidney stones    Hyperlipidemia    Hypertension    Hypothyroidism    Kidney stones    Lung cancer (HCC)    Non small cell lung cancer, adenocarcinoma with BAC 09/2009, VATS converted to wedge resection of left upper lobe mass   Mitral valve prolapse    "no ORs" (07/28/2015)   Osteopenia    Pneumonia 1970's; 06/2005   Sleep apnea dx'd 06/2015   on oxygen at nite at 2L/[place]    Squamous cell skin cancer    Past Surgical History:  Procedure Laterality Date   ABDOMINAL HYSTERECTOMY  1988   COLONOSCOPY WITH PROPOFOL N/A 06/15/2017   Procedure: COLONOSCOPY WITH PROPOFOL;  Surgeon: Jeani Hawking, MD;  Location: [facility];  Service: Endoscopy;  Laterality: N/A;   HAMMER TOE SURGERY Right ~ 2010   SALPINGOOPHORECTOMY Left 1988   SKIN CANCER EXCISION     "I've had severl cut off RLE; left foreqrm, nose under nose"   THORACOTOMY Left 06/2005   THORACOTOMY/LOBECTOMY  09/2009; 11/2009   left; right   Current Outpatient Medications on File Prior to Visit  Medication Sig Dispense Refill   acetaminophen (TYLENOL) 325 MG tablet Take by mouth.     albuterol (VENTOLIN HFA) 108 (90 Base) MCG/ACT inhaler Inhale 1 puff into the lungs every 6 (six) hours as needed for wheezing or shortness of breath.     Calcium Carbonate-Vit D-Min (CALCIUM 1200 PO) Take 50 mg by mouth. ONCE A WEEK     co-enzyme Q-10 30 MG capsule Take 100 mg by mouth 3 (three) times daily.     Cyanocobalamin 1000 MCG CAPS Take by mouth.     FLUoxetine (PROZAC) 20 MG tablet Take 2 tablets (40 mg total) by mouth daily. Stop duloxetine 180 tablet 3   fluticasone (FLONASE) 50 MCG/ACT nasal spray USE 2 SPRAYS IN EACH NOSTRIL DAILY 48 g 3   Fluticasone-Umeclidin-Vilant (TRELEGY ELLIPTA) 100-62.5-25 MCG/ACT AEPB Inhale 1 puff into the lungs daily. 1 each 5   levothyroxine (SYNTHROID) 112 MCG tablet TAKE 1 TABLET DAILY (DISCONTINUE LEVOTHYROXINE 100) 90  tablet 3   losartan-hydrochlorothiazide (HYZAAR) 100-25 MG tablet TAKE 1 TABLET DAILY 90 tablet 3   meclizine (ANTIVERT) 25 MG tablet TAKE 1 TABLET BY MOUTH 3 TIMES DAILY AS NEEDED FOR DIZZINESS. 30 tablet 0   OXYGEN 2lpm with sleep     rosuvastatin (CRESTOR) 20 MG tablet Take 1 tablet (20 mg total) by mouth daily. 90 tablet 3   thiamine (VITAMIN B-1) 100 MG tablet Take 100 mg by mouth daily.     No current facility-administered medications on file prior to visit.    Allergies  Allergen Reactions   Sulfonamide Derivatives Hives   Amoxicillin Diarrhea    Diarrhea, doesnt tolerate    Doxycycline Nausea Only   Erythromycin Nausea Only   Social History   Socioeconomic History   Marital status: Married    Spouse name: Not on file   Number of children: 3   Years of education: Not on file   Highest education level: Not on file  Occupational History   Occupation: retired Academic librarian: RETIRED  Tobacco Use   Smoking status: Former    Current packs/day: 0.00    Average packs/day: 0.8 packs/day for 30.0 years (22.5 ttl pk-yrs)    Types: Cigarettes    Start date: 09/11/1974    Quit date: 09/11/2004    Years since quitting: 18.5   Smokeless tobacco: Never   Tobacco comments:  Pt is vaping x 1.5 years.  Contains nicotine  Vaping Use   Vaping status: Never Used  Substance and Sexual Activity   Alcohol use: Yes    Alcohol/week: 7.0 standard drinks of alcohol    Types: 7 Shots of liquor per week    Comment: 1-2 drinks nightly    Drug use: No   Sexual activity: Yes  Other Topics Concern   Not on file  Social History Narrative   Not on file   Social Determinants of Health   Financial Resource Strain: Low Risk  (02/16/2023)   Overall Financial Resource Strain (CARDIA)    Difficulty of Paying Living Expenses: Not hard at all  Food Insecurity: No Food Insecurity (02/16/2023)   Hunger Vital Sign    Worried About Running Out of Food in the Last Year: Never true    Ran Out of Food  in the Last Year: Never true  Transportation Needs: No Transportation Needs (08/17/2022)   PRAPARE - Administrator, Civil Service (Medical): No    Lack of Transportation (Non-Medical): No  Physical Activity: Inactive (08/17/2022)   Exercise Vital Sign    Days of Exercise per Week: 0 days    Minutes of Exercise per Session: 0 min  Stress: No Stress Concern Present (08/17/2022)   [employer] of Occupational Health - Occupational Stress Questionnaire    Feeling of Stress : Only a little  Social Connections: Moderately Integrated (08/17/2022)   Social Connection and Isolation Panel [NHANES]    Frequency of Communication with Friends and Family: More than three times a week    Frequency of Social Gatherings with Friends and Family: Three times a week    Attends Religious Services: Never    Active Member of Clubs or Organizations: No    Attends Engineer, structural: More than 4 times per year    Marital Status: Married  Catering manager Violence: Not At Risk (08/17/2022)   Humiliation, Afraid, Rape, and Kick questionnaire    Fear of Current or Ex-Partner: No    Emotionally Abused: No    Physically Abused: No    Sexually Abused: No      Review of Systems  All other systems reviewed and are negative.      Objective:   Physical Exam Vitals reviewed.  Constitutional:      General: She is not in acute distress.    Appearance: Normal appearance. She is normal weight. She is not ill-appearing or toxic-appearing.  Cardiovascular:     Rate and Rhythm: Normal rate and regular rhythm.     Heart sounds: Normal heart sounds.  Pulmonary:     Effort: Pulmonary effort is normal.     Breath sounds: Normal breath sounds. No wheezing, rhonchi or rales.  Abdominal:     General: Bowel sounds are normal.     Palpations: Abdomen is soft.  Musculoskeletal:     Right lower leg: No edema.     Left lower leg: No edema.  Neurological:     Mental Status: She is alert.            Assessment & Plan:  General medical exam  COPD mixed type (HCC)  Benign essential HTN  Hypothyroidism, unspecified type  Encounter for Medicare annual wellness exam Patient seems to be doing remarkably well given all the stress she has been through in the last year.  We will try trazodone 50 mg p.o. nightly for insomnia.  I recommended RSV vaccination.  The  remainder of her vaccines is up-to-date.  She schedules her mammogram in December.  She does not require colonoscopy or Pap smear.  She denies any ongoing depression now.  The depression was much worse when she was watching her husband suffer.  Now that that situation has resolved, her depression has improved.  She does have prediabetes.  I recommended a low-carb diet.  This would also help lower her triglycerides.  The remainder of her lab work looks good except for a TSH of 7.  Therefore I increase levothyroxine to 125 mcg a day and recommended rechecking a TSH in 6 weeks

## 2023-03-30 ENCOUNTER — Other Ambulatory Visit: Payer: Self-pay | Admitting: Family Medicine

## 2023-03-30 NOTE — Telephone Encounter (Signed)
Last OV 03/29/23, within protocol.  Requested Prescriptions  Pending Prescriptions Disp Refills   fluticasone (FLONASE) 50 MCG/ACT nasal spray [Pharmacy Med Name: FLUTICASONE PROP NASAL SPRAY 16GM 50MCG] 48 g 0    Sig: USE 2 SPRAYS IN EACH NOSTRIL DAILY     Ear, Nose, and Throat: Nasal Preparations - Corticosteroids Failed - 03/30/2023  6:14 AM      Failed - Valid encounter within last 12 months    Recent Outpatient Visits           1 year ago Vertigo   [facility] Donita Brooks, MD   1 year ago Leg swelling   [facility] Donita Brooks, MD   1 year ago Acute renal insufficiency   [facility] Donita Brooks, MD   1 year ago Anasarca   [facility] Tanya Nones, Priscille Heidelberg, MD   1 year ago Anasarca   [facility] Pickard, Priscille Heidelberg, MD

## 2023-04-05 NOTE — Addendum Note (Signed)
Addended by: Lynnea Ferrier T on: 04/05/2023 06:39 AM   Modules accepted: Level of Service

## 2023-05-01 DIAGNOSIS — L814 Other melanin hyperpigmentation: Secondary | ICD-10-CM | POA: Diagnosis not present

## 2023-05-01 DIAGNOSIS — Z85828 Personal history of other malignant neoplasm of skin: Secondary | ICD-10-CM | POA: Diagnosis not present

## 2023-05-01 DIAGNOSIS — L821 Other seborrheic keratosis: Secondary | ICD-10-CM | POA: Diagnosis not present

## 2023-05-01 DIAGNOSIS — L578 Other skin changes due to chronic exposure to nonionizing radiation: Secondary | ICD-10-CM | POA: Diagnosis not present

## 2023-05-01 DIAGNOSIS — D225 Melanocytic nevi of trunk: Secondary | ICD-10-CM | POA: Diagnosis not present

## 2023-05-01 DIAGNOSIS — L570 Actinic keratosis: Secondary | ICD-10-CM | POA: Diagnosis not present

## 2023-05-01 DIAGNOSIS — Z08 Encounter for follow-up examination after completed treatment for malignant neoplasm: Secondary | ICD-10-CM | POA: Diagnosis not present

## 2023-05-22 ENCOUNTER — Encounter: Payer: Medicare Other | Admitting: Pharmacist

## 2023-06-11 ENCOUNTER — Other Ambulatory Visit: Payer: Self-pay | Admitting: Family Medicine

## 2023-06-12 ENCOUNTER — Encounter: Payer: Self-pay | Admitting: Family Medicine

## 2023-06-12 NOTE — Telephone Encounter (Signed)
Requested Prescriptions  Pending Prescriptions Disp Refills   losartan-hydrochlorothiazide (HYZAAR) 100-25 MG tablet [Pharmacy Med Name: LOSARTAN/ HYDROCHLOROTHIAZIDE TABS 100/25MG ] 90 tablet 3    Sig: TAKE 1 TABLET DAILY     Cardiovascular: ARB + Diuretic Combos Failed - 06/11/2023  3:41 AM      Failed - Valid encounter within last 6 months    Recent Outpatient Visits           1 year ago Vertigo   [facility] Donita Brooks, MD   1 year ago Leg swelling   [facility] Donita Brooks, MD   1 year ago Acute renal insufficiency   [facility] Donita Brooks, MD   1 year ago Anasarca   [facility] Donita Brooks, MD   1 year ago Anasarca   [facility] Tanya Nones, Priscille Heidelberg, MD              Passed - K in normal range and within 180 days    Potassium  Date Value Ref Range Status  03/19/2023 4.0 3.5 - 5.3 mmol/L Final         Passed - Na in normal range and within 180 days    Sodium  Date Value Ref Range Status  03/19/2023 140 135 - 146 mmol/L Final         Passed - Cr in normal range and within 180 days    Creat  Date Value Ref Range Status  03/19/2023 0.82 0.60 - 0.95 mg/dL Final         Passed - eGFR is 10 or above and within 180 days    GFR, Est African American  Date Value Ref Range Status  07/15/2020 87 > OR = 60 mL/min/1.70m2 Final   GFR, Est Non African American  Date Value Ref Range Status  07/15/2020 75 > OR = 60 mL/min/1.44m2 Final   GFR, Estimated  Date Value Ref Range Status  11/06/2022 >60 >60 mL/min Final    Comment:    (NOTE) Calculated using the CKD-EPI Creatinine Equation (2021)    GFR  Date Value Ref Range Status  06/29/2017 83.91 >60.00 mL/min Final   eGFR  Date Value Ref Range Status  03/19/2023 72 > OR = 60 mL/min/1.65m2 Final         Passed - Patient is not pregnant      Passed - Last BP in normal range    BP Readings from Last 1  Encounters:  03/29/23 118/62

## 2023-06-14 ENCOUNTER — Other Ambulatory Visit: Payer: Self-pay

## 2023-06-14 DIAGNOSIS — E7800 Pure hypercholesterolemia, unspecified: Secondary | ICD-10-CM

## 2023-06-14 DIAGNOSIS — F411 Generalized anxiety disorder: Secondary | ICD-10-CM

## 2023-06-14 DIAGNOSIS — E039 Hypothyroidism, unspecified: Secondary | ICD-10-CM

## 2023-06-14 DIAGNOSIS — J449 Chronic obstructive pulmonary disease, unspecified: Secondary | ICD-10-CM

## 2023-06-14 DIAGNOSIS — I10 Essential (primary) hypertension: Secondary | ICD-10-CM

## 2023-06-14 DIAGNOSIS — J302 Other seasonal allergic rhinitis: Secondary | ICD-10-CM

## 2023-06-14 MED ORDER — LOSARTAN POTASSIUM-HCTZ 100-25 MG PO TABS: 1.0000 | ORAL_TABLET | Freq: Every day | ORAL | 2 refills | Status: DC

## 2023-06-14 MED ORDER — FLUTICASONE PROPIONATE 50 MCG/ACT NA SUSP: 2.0000 | Freq: Every day | NASAL | 2 refills | Status: DC

## 2023-06-14 MED ORDER — FLUOXETINE HCL 20 MG PO TABS: 40.0000 mg | ORAL_TABLET | Freq: Every day | ORAL | 2 refills | Status: DC

## 2023-06-14 MED ORDER — TRELEGY ELLIPTA 100-62.5-25 MCG/ACT IN AEPB: 1.0000 | INHALATION_SPRAY | Freq: Every day | RESPIRATORY_TRACT | 5 refills | Status: DC

## 2023-06-14 MED ORDER — ROSUVASTATIN CALCIUM 20 MG PO TABS: 20.0000 mg | ORAL_TABLET | Freq: Every day | ORAL | 2 refills | Status: DC

## 2023-06-14 MED ORDER — ALBUTEROL SULFATE HFA 108 (90 BASE) MCG/ACT IN AERS: 1.0000 | INHALATION_SPRAY | Freq: Four times a day (QID) | RESPIRATORY_TRACT | 2 refills | Status: DC | PRN

## 2023-06-14 MED ORDER — LEVOTHYROXINE SODIUM 125 MCG PO TABS: 125.0000 ug | ORAL_TABLET | Freq: Every day | ORAL | 2 refills | Status: DC

## 2023-07-30 ENCOUNTER — Ambulatory Visit: Payer: Medicare Other | Admitting: Internal Medicine

## 2023-08-17 ENCOUNTER — Ambulatory Visit (INDEPENDENT_AMBULATORY_CARE_PROVIDER_SITE_OTHER): Payer: Medicare Other | Admitting: Family Medicine

## 2023-08-17 ENCOUNTER — Encounter: Payer: Self-pay | Admitting: Family Medicine

## 2023-08-17 VITALS — BP 130/82 | HR 77 | Temp 98.8°F | Ht 66.0 in | Wt 207.5 lb

## 2023-08-17 DIAGNOSIS — R739 Hyperglycemia, unspecified: Secondary | ICD-10-CM

## 2023-08-17 DIAGNOSIS — F331 Major depressive disorder, recurrent, moderate: Secondary | ICD-10-CM | POA: Diagnosis not present

## 2023-08-17 DIAGNOSIS — E039 Hypothyroidism, unspecified: Secondary | ICD-10-CM | POA: Diagnosis not present

## 2023-08-17 MED ORDER — BUPROPION HCL ER (XL) 150 MG PO TB24: 150.0000 mg | ORAL_TABLET | Freq: Every day | ORAL | 3 refills | Status: DC

## 2023-08-17 NOTE — Progress Notes (Signed)
Subjective:    Patient ID: [name], female    DOB: 08-13-1941, 82 y.o.   MRN: [id number]  Patient is a very pleasant 82 year old Caucasian female who presents today requesting assistance with depression.  Her husband passed away in October 06, 2022 of this year.  She has been battling depression ever since.  Certainly approaching the anniversary of this has only worsened the situation.  She states that she does not want to do anything.  She primarily just sits around all day.  She has no desire to go anywhere.  She feels tired all the time but she cannot sleep.  She denies any suicidal thoughts.  She is currently on Prozac 40 mg a day.  In the past she has tried venlafaxine, Zoloft, Rexulti, Abilify without success.  We also checked her thyroid in July.  We increased her levothyroxine.  She is due to recheck this today.  On her last fasting lab work, her sugar was elevated at 115.  She has gained weight since that time.  We are due to recheck that as well Past Medical History:  Diagnosis Date   ADD (attention deficit disorder)    Allergic rhinitis    Anemia    Anxiety    Arrhythmia    Arthritis    "hands, neck, right shoulder" (07/28/2015)   Aseptic necrosis (HCC)    Asthma dxc'd 05/2015   Basal cell cancer    Bleeding stomach ulcer ~ 06/2005   COPD (chronic obstructive pulmonary disease) (HCC)    Depression    Diverticula of colon    Dyspnea    Esophagitis    Gastric ulcer with hemorrhage    GERD (gastroesophageal reflux disease)    Heart murmur    History of blood transfusion 06/2005   "related to pneumonia"   History of kidney stones    Hyperlipidemia    Hypertension    Hypothyroidism    Kidney stones    Lung cancer (HCC)    Non small cell lung cancer, adenocarcinoma with BAC 10-06-09, VATS converted to wedge resection of left upper lobe mass   Mitral valve prolapse    "no ORs" (07/28/2015)   Osteopenia    Pneumonia 1970's; 06/2005   Sleep apnea dx'd 06/2015   on  oxygen at nite at 2L/[place]    Squamous cell skin cancer    Past Surgical History:  Procedure Laterality Date   ABDOMINAL HYSTERECTOMY  1988   COLONOSCOPY WITH PROPOFOL N/A 06/15/2017   Procedure: COLONOSCOPY WITH PROPOFOL;  Surgeon: Jeani Hawking, MD;  Location: [facility];  Service: Endoscopy;  Laterality: N/A;   HAMMER TOE SURGERY Right ~ 2010   SALPINGOOPHORECTOMY Left 1988   SKIN CANCER EXCISION     "I've had severl cut off RLE; left foreqrm, nose under nose"   THORACOTOMY Left 06/2005   THORACOTOMY/LOBECTOMY  10-06-2009; 11/2009   left; right   Current Outpatient Medications on File Prior to Visit  Medication Sig Dispense Refill   acetaminophen (TYLENOL) 325 MG tablet Take by mouth.     albuterol (VENTOLIN HFA) 108 (90 Base) MCG/ACT inhaler Inhale 1 puff into the lungs every 6 (six) hours as needed for wheezing or shortness of breath. 8 g 2   Calcium Carbonate-Vit D-Min (CALCIUM 1200 PO) Take 50 mg by mouth. ONCE A WEEK     co-enzyme Q-10 30 MG capsule Take 100 mg by mouth 3 (three) times daily.     Cyanocobalamin 1000 MCG CAPS Take by mouth.  FLUoxetine (PROZAC) 20 MG tablet Take 2 tablets (40 mg total) by mouth daily. Stop duloxetine 180 tablet 2   fluticasone (FLONASE) 50 MCG/ACT nasal spray Place 2 sprays into both nostrils daily. 48 g 2   Fluticasone-Umeclidin-Vilant (TRELEGY ELLIPTA) 100-62.5-25 MCG/ACT AEPB Inhale 1 puff into the lungs daily. 1 each 5   levothyroxine (SYNTHROID) 125 MCG tablet Take 1 tablet (125 mcg total) by mouth daily. 90 tablet 2   losartan-hydrochlorothiazide (HYZAAR) 100-25 MG tablet Take 1 tablet by mouth daily. 90 tablet 2   meclizine (ANTIVERT) 25 MG tablet TAKE 1 TABLET BY MOUTH 3 TIMES DAILY AS NEEDED FOR DIZZINESS. 30 tablet 0   OXYGEN 2lpm with sleep     rosuvastatin (CRESTOR) 20 MG tablet Take 1 tablet (20 mg total) by mouth daily. 90 tablet 2   thiamine (VITAMIN B-1) 100 MG tablet Take 100 mg by mouth daily.     traZODone (DESYREL) 50 MG tablet  Take 1 tablet (50 mg total) by mouth at bedtime. 90 tablet 3   No current facility-administered medications on file prior to visit.    Allergies  Allergen Reactions   Sulfonamide Derivatives Hives   Amoxicillin Diarrhea    Diarrhea, doesnt tolerate    Doxycycline Nausea Only   Erythromycin Nausea Only   Social History   Socioeconomic History   Marital status: Married    Spouse name: Not on file   Number of children: 3   Years of education: Not on file   Highest education level: Not on file  Occupational History   Occupation: retired Academic librarian: RETIRED  Tobacco Use   Smoking status: Former    Current packs/day: 0.00    Average packs/day: 0.8 packs/day for 30.0 years (22.5 ttl pk-yrs)    Types: Cigarettes    Start date: 09/11/1974    Quit date: 09/11/2004    Years since quitting: 18.9   Smokeless tobacco: Never   Tobacco comments:    Pt is vaping x 1.5 years.  Contains nicotine  Vaping Use   Vaping status: Never Used  Substance and Sexual Activity   Alcohol use: Yes    Alcohol/week: 7.0 standard drinks of alcohol    Types: 7 Shots of liquor per week    Comment: 1-2 drinks nightly    Drug use: No   Sexual activity: Yes  Other Topics Concern   Not on file  Social History Narrative   Not on file   Social Determinants of Health   Financial Resource Strain: Low Risk  (02/16/2023)   Overall Financial Resource Strain (CARDIA)    Difficulty of Paying Living Expenses: Not hard at all  Food Insecurity: No Food Insecurity (02/16/2023)   Hunger Vital Sign    Worried About Running Out of Food in the Last Year: Never true    Ran Out of Food in the Last Year: Never true  Transportation Needs: No Transportation Needs (08/17/2022)   PRAPARE - Administrator, Civil Service (Medical): No    Lack of Transportation (Non-Medical): No  Physical Activity: Inactive (08/17/2022)   Exercise Vital Sign    Days of Exercise per Week: 0 days    Minutes of Exercise per  Session: 0 min  Stress: No Stress Concern Present (08/17/2022)   [employer] of Occupational Health - Occupational Stress Questionnaire    Feeling of Stress : Only a little  Social Connections: Moderately Integrated (08/17/2022)   Social Connection and Isolation Panel [NHANES]  Frequency of Communication with Friends and Family: More than three times a week    Frequency of Social Gatherings with Friends and Family: Three times a week    Attends Religious Services: Never    Active Member of Clubs or Organizations: No    Attends Engineer, structural: More than 4 times per year    Marital Status: Married  Catering manager Violence: Not At Risk (08/17/2022)   Humiliation, Afraid, Rape, and Kick questionnaire    Fear of Current or Ex-Partner: No    Emotionally Abused: No    Physically Abused: No    Sexually Abused: No      Review of Systems  All other systems reviewed and are negative.      Objective:   Physical Exam Vitals reviewed.  Constitutional:      General: She is not in acute distress.    Appearance: Normal appearance. She is normal weight. She is not ill-appearing or toxic-appearing.  Cardiovascular:     Rate and Rhythm: Normal rate and regular rhythm.     Heart sounds: Normal heart sounds.  Pulmonary:     Effort: Pulmonary effort is normal.     Breath sounds: Normal breath sounds. No wheezing, rhonchi or rales.  Abdominal:     General: Bowel sounds are normal.     Palpations: Abdomen is soft.  Musculoskeletal:     Right lower leg: No edema.     Left lower leg: No edema.  Neurological:     Mental Status: She is alert.           Assessment & Plan:  Hypothyroidism, unspecified type - Plan: CBC with Differential/Platelet, COMPLETE METABOLIC PANEL WITH GFR, TSH  Elevated blood sugar - Plan: Hemoglobin A1c  Moderate episode of recurrent major depressive disorder (HCC) Continue Prozac 40 mg a day but add Wellbutrin extended release 150 mg a day.   Reassess in 6 weeks.  I tried to convince the patient to avoid medicines like Xanax and sleeping pills as these can increase her risk of falls memory loss.  She is already reporting trouble with her memory and her concentration.  While the patient is here today I will recheck a TSH to ensure the correct dosage of levothyroxine and I will check a hemoglobin A1c to monitor her blood sugar.

## 2023-08-18 LAB — HEMOGLOBIN A1C
Hgb A1c MFr Bld: 5.8 %{Hb} — ABNORMAL HIGH (ref <5.7)
Mean Plasma Glucose: 120 mg/dL
eAG (mmol/L): 6.6 mmol/L

## 2023-08-18 LAB — CBC WITH DIFFERENTIAL/PLATELET
Absolute Lymphocytes: 1772 {cells}/uL (ref 850–3900)
Absolute Monocytes: 929 {cells}/uL (ref 200–950)
Basophils Absolute: 69 {cells}/uL (ref 0–200)
Basophils Relative: 0.8 %
Eosinophils Absolute: 198 {cells}/uL (ref 15–500)
Eosinophils Relative: 2.3 %
HCT: 39.8 % (ref 35.0–45.0)
Hemoglobin: 13.4 g/dL (ref 11.7–15.5)
MCH: 32.7 pg (ref 27.0–33.0)
MCHC: 33.7 g/dL (ref 32.0–36.0)
MCV: 97.1 fL (ref 80.0–100.0)
MPV: 10.7 fL (ref 7.5–12.5)
Monocytes Relative: 10.8 %
Neutro Abs: 5633 {cells}/uL (ref 1500–7800)
Neutrophils Relative %: 65.5 %
Platelets: 255 10*3/uL (ref 140–400)
RBC: 4.1 10*6/uL (ref 3.80–5.10)
RDW: 12.9 % (ref 11.0–15.0)
Total Lymphocyte: 20.6 %
WBC: 8.6 10*3/uL (ref 3.8–10.8)

## 2023-08-18 LAB — COMPLETE METABOLIC PANEL WITH GFR
AG Ratio: 1.9 (calc) (ref 1.0–2.5)
ALT: 14 U/L (ref 6–29)
AST: 15 U/L (ref 10–35)
Albumin: 4.1 g/dL (ref 3.6–5.1)
Alkaline phosphatase (APISO): 58 U/L (ref 37–153)
BUN: 14 mg/dL (ref 7–25)
CO2: 28 mmol/L (ref 20–32)
Calcium: 9.8 mg/dL (ref 8.6–10.4)
Chloride: 102 mmol/L (ref 98–110)
Creat: 0.85 mg/dL (ref 0.60–0.95)
Globulin: 2.2 g/dL (ref 1.9–3.7)
Glucose, Bld: 109 mg/dL — ABNORMAL HIGH (ref 65–99)
Potassium: 4.4 mmol/L (ref 3.5–5.3)
Sodium: 139 mmol/L (ref 135–146)
Total Bilirubin: 0.3 mg/dL (ref 0.2–1.2)
Total Protein: 6.3 g/dL (ref 6.1–8.1)
eGFR: 69 mL/min/{1.73_m2} (ref >=60)

## 2023-08-18 LAB — TSH: TSH: 3.22 m[IU]/L (ref 0.40–4.50)

## 2023-08-20 ENCOUNTER — Telehealth: Payer: Self-pay

## 2023-08-20 NOTE — Telephone Encounter (Signed)
Copied from CRM [id number]. Topic: Clinical - Medication Question >> Aug 20, 2023  9:17 AM Alona Bene A wrote: Reason for CRM: Patient wants to speak to nurse regarding prescription for Xanax. Patient states she is not sure why she has no prescription for Xanax, she did not pick up any medication for Xanax at pharmacy. Patient would like to speak with Nurse for clarification on what she needs to take. Patient stated that provider advised her that she should take buPROPion (WELLBUTRIN XL) 150 MG 24 hr tablet with Xanax but there is no prescription for it. Patient would like to be called at [phone].

## 2023-08-23 ENCOUNTER — Other Ambulatory Visit: Payer: Self-pay

## 2023-08-23 ENCOUNTER — Encounter: Payer: Self-pay | Admitting: Family Medicine

## 2023-08-23 DIAGNOSIS — J449 Chronic obstructive pulmonary disease, unspecified: Secondary | ICD-10-CM

## 2023-08-23 DIAGNOSIS — R2689 Other abnormalities of gait and mobility: Secondary | ICD-10-CM

## 2023-08-23 DIAGNOSIS — M542 Cervicalgia: Secondary | ICD-10-CM

## 2023-08-23 DIAGNOSIS — M51369 Other intervertebral disc degeneration, lumbar region without mention of lumbar back pain or lower extremity pain: Secondary | ICD-10-CM

## 2023-08-23 DIAGNOSIS — R0609 Other forms of dyspnea: Secondary | ICD-10-CM

## 2023-08-30 ENCOUNTER — Ambulatory Visit: Payer: Medicare Other | Admitting: *Deleted

## 2023-08-30 DIAGNOSIS — Z0000 Encounter for general adult medical examination without abnormal findings: Secondary | ICD-10-CM

## 2023-08-30 NOTE — Progress Notes (Signed)
Subjective:   [name] is a 82 y.o. female who presents for Medicare Annual (Subsequent) preventive examination.  Visit Complete: Virtual I connected with  [name] on 08/30/23 by a audio enabled telemedicine application and verified that I am speaking with the correct person using two identifiers.  Patient Location: Home  Provider Location: Home Office  I discussed the limitations of evaluation and management by telemedicine. The patient expressed understanding and agreed to proceed.  Vital Signs: Because this visit was a virtual/telehealth visit, some criteria may be missing or patient reported. Any vitals not documented were not able to be obtained and vitals that have been documented are patient reported.  Cardiac Risk Factors include: advanced age (>26men, >15 women)     Objective:    There were no vitals filed for this visit. There is no height or weight on file to calculate BMI.     08/30/2023    3:37 PM 08/17/2022    4:21 PM 11/08/2021   10:29 AM 02/17/2021   10:08 AM 06/15/2017   12:19 PM 06/14/2017    9:48 AM 07/28/2016    2:15 PM  Advanced Directives  Does Patient Have a Medical Advance Directive? Yes Yes Yes Yes Yes Yes Yes  Type of Estate agent of [place];Out of facility DNR (pink MOST or yellow form) Healthcare Power of [place];Living will Healthcare Power of [place];Living will Healthcare Power of [place];Living will Healthcare Power of [place];Living will Healthcare Power of [place];Living will Healthcare Power of [place];Living will  Does patient want to make changes to medical advance directive?  No - Patient declined No - Patient declined No - Patient declined   No - Patient declined  Copy of Healthcare Power of Attorney in Chart? Yes - validated most recent copy scanned in chart (See row information) Yes - validated most recent copy scanned in chart (See row information) Yes - validated most recent copy  scanned in chart (See row information) Yes - validated most recent copy scanned in chart (See row information) Yes Yes No - copy requested    Current Medications (verified) Outpatient Encounter Medications as of 08/30/2023  Medication Sig   albuterol (VENTOLIN HFA) 108 (90 Base) MCG/ACT inhaler Inhale 1 puff into the lungs every 6 (six) hours as needed for wheezing or shortness of breath.   buPROPion (WELLBUTRIN XL) 150 MG 24 hr tablet Take 1 tablet (150 mg total) by mouth daily.   Calcium Carbonate-Vit D-Min (CALCIUM 1200 PO) Take 50 mg by mouth. ONCE A WEEK   Cyanocobalamin 1000 MCG CAPS Take by mouth.   FLUoxetine (PROZAC) 20 MG tablet Take 2 tablets (40 mg total) by mouth daily. Stop duloxetine   fluticasone (FLONASE) 50 MCG/ACT nasal spray Place 2 sprays into both nostrils daily.   Fluticasone-Umeclidin-Vilant (TRELEGY ELLIPTA) 100-62.5-25 MCG/ACT AEPB Inhale 1 puff into the lungs daily.   levothyroxine (SYNTHROID) 125 MCG tablet Take 1 tablet (125 mcg total) by mouth daily.   losartan-hydrochlorothiazide (HYZAAR) 100-25 MG tablet Take 1 tablet by mouth daily.   OXYGEN 2lpm with sleep   rosuvastatin (CRESTOR) 20 MG tablet Take 1 tablet (20 mg total) by mouth daily.   thiamine (VITAMIN B-1) 100 MG tablet Take 100 mg by mouth daily.   traZODone (DESYREL) 50 MG tablet Take 1 tablet (50 mg total) by mouth at bedtime.   No facility-administered encounter medications on file as of 08/30/2023.    Allergies (verified) Sulfonamide derivatives, Amoxicillin, Doxycycline, and Erythromycin   History: Past Medical  History:  Diagnosis Date   ADD (attention deficit disorder)    Allergic rhinitis    Anemia    Anxiety    Arrhythmia    Arthritis    "hands, neck, right shoulder" (07/28/2015)   Aseptic necrosis (HCC)    Asthma dxc'd 05/2015   Basal cell cancer    Bleeding stomach ulcer ~ 06/2005   COPD (chronic obstructive pulmonary disease) (HCC)    Depression    Diverticula of colon     Dyspnea    Esophagitis    Gastric ulcer with hemorrhage    GERD (gastroesophageal reflux disease)    Heart murmur    History of blood transfusion 06/2005   "related to pneumonia"   History of kidney stones    Hyperlipidemia    Hypertension    Hypothyroidism    Kidney stones    Lung cancer (HCC)    Non small cell lung cancer, adenocarcinoma with BAC 09/2009, VATS converted to wedge resection of left upper lobe mass   Mitral valve prolapse    "no ORs" (07/28/2015)   Osteopenia    Pneumonia 1970's; 06/2005   Sleep apnea dx'd 06/2015   on oxygen at nite at 2L/[place]    Squamous cell skin cancer    Past Surgical History:  Procedure Laterality Date   ABDOMINAL HYSTERECTOMY  1988   COLONOSCOPY WITH PROPOFOL N/A 06/15/2017   Procedure: COLONOSCOPY WITH PROPOFOL;  Surgeon: Jeani Hawking, MD;  Location: [facility];  Service: Endoscopy;  Laterality: N/A;   HAMMER TOE SURGERY Right ~ 2010   SALPINGOOPHORECTOMY Left 1988   SKIN CANCER EXCISION     "I've had severl cut off RLE; left foreqrm, nose under nose"   THORACOTOMY Left 06/2005   THORACOTOMY/LOBECTOMY  09/2009; 11/2009   left; right   Family History  Problem Relation Age of Onset   Graves' disease Daughter    Breast cancer Maternal Aunt    Heart disease Maternal Aunt    Hypertension Mother    Arthritis Maternal Grandmother    Social History   Socioeconomic History   Marital status: Married    Spouse name: Not on file   Number of children: 3   Years of education: Not on file   Highest education level: Not on file  Occupational History   Occupation: retired Academic librarian: RETIRED  Tobacco Use   Smoking status: Former    Current packs/day: 0.00    Average packs/day: 0.8 packs/day for 30.0 years (22.5 ttl pk-yrs)    Types: Cigarettes    Start date: 09/11/1974    Quit date: 09/11/2004    Years since quitting: 18.9   Smokeless tobacco: Never   Tobacco comments:    Pt is vaping x 1.5 years.  Contains nicotine  Vaping Use    Vaping status: Never Used  Substance and Sexual Activity   Alcohol use: Yes    Alcohol/week: 7.0 standard drinks of alcohol    Types: 7 Shots of liquor per week    Comment: 1-2 drinks nightly    Drug use: No   Sexual activity: Yes  Other Topics Concern   Not on file  Social History Narrative   Not on file   Social Drivers of Health   Financial Resource Strain: Low Risk  (08/30/2023)   Overall Financial Resource Strain (CARDIA)    Difficulty of Paying Living Expenses: Not hard at all  Food Insecurity: No Food Insecurity (08/30/2023)   Hunger Vital Sign    Worried  About Running Out of Food in the Last Year: Never true    Ran Out of Food in the Last Year: Never true  Transportation Needs: No Transportation Needs (08/30/2023)   PRAPARE - Administrator, Civil Service (Medical): No    Lack of Transportation (Non-Medical): No  Physical Activity: Inactive (08/30/2023)   Exercise Vital Sign    Days of Exercise per Week: 0 days    Minutes of Exercise per Session: 0 min  Stress: No Stress Concern Present (08/30/2023)   [employer] of Occupational Health - Occupational Stress Questionnaire    Feeling of Stress : Only a little  Social Connections: Socially Isolated (08/30/2023)   Social Connection and Isolation Panel [NHANES]    Frequency of Communication with Friends and Family: More than three times a week    Frequency of Social Gatherings with Friends and Family: Once a week    Attends Religious Services: Never    Database administrator or Organizations: No    Attends Banker Meetings: Never    Marital Status: Widowed    Tobacco Counseling Counseling given: Not Answered Tobacco comments: Pt is vaping x 1.5 years.  Contains nicotine   Clinical Intake:  Pre-visit preparation completed: Yes  Pain : No/denies pain     Diabetes: No  How often do you need to have someone help you when you read instructions, pamphlets, or other written  materials from your doctor or pharmacy?: 1 - Never  Interpreter Needed?: No  Information entered by :: Remi Haggard LPN   Activities of Daily Living    08/30/2023    3:40 PM  In your present state of health, do you have any difficulty performing the following activities:  Hearing? 1  Vision? 0  Difficulty concentrating or making decisions? 1  Walking or climbing stairs? 1  Dressing or bathing? 0  Doing errands, shopping? 1  Preparing Food and eating ? Y  Using the Toilet? N  In the past six months, have you accidently leaked urine? Y  Do you have problems with loss of bowel control? N  Managing your Medications? N  Managing your Finances? N  Housekeeping or managing your Housekeeping? Y    Patient Care Team: Donita Brooks, MD as PCP - General (Family Medicine) Quintella Reichert, MD as PCP - Cardiology (Cardiology) Dr. Lennox Pippins (Oncology)  Indicate any recent Medical Services you may have received from other than [facility] providers in the past year (date may be approximate).     Assessment:   This is a routine wellness examination for [place].  Hearing/Vision screen Hearing Screening - Comments:: Bilateral hearing aids Vision Screening - Comments:: Up to date  Unsure of name   Goals Addressed             This Visit's Progress    DIET - EAT MORE FRUITS AND VEGETABLES   On track    Patient Stated       Would like to get stronger        Depression Screen    08/30/2023    3:42 PM 08/17/2023   12:04 PM 08/17/2022    4:20 PM 07/06/2022   10:19 AM 04/05/2021   12:10 PM 02/17/2021   10:11 AM 12/13/2020   12:18 PM  PHQ 2/9 Scores  PHQ - 2 Score 4 6 1 1 6 4 6   PHQ- 9 Score 10 20 7  24 16 22     Fall Risk  08/30/2023    3:34 PM 08/17/2023   12:04 PM 08/17/2022    4:19 PM 07/06/2022   10:19 AM 02/17/2021   10:09 AM  Fall Risk   Falls in the past year? 1 1 1 1  0  Number falls in past yr: 1 1 1 1  0  Injury with Fall? 0 0 1 1 0  Risk for fall due to :  History of fall(s);Impaired balance/gait  Impaired balance/gait;Impaired mobility;History of fall(s) History of fall(s);Impaired balance/gait;Orthopedic patient Medication side effect  Follow up Falls evaluation completed;Education provided;Falls prevention discussed  Education provided;Falls evaluation completed;Falls prevention discussed Education provided;Falls prevention discussed Falls evaluation completed;Falls prevention discussed    MEDICARE RISK AT HOME: Medicare Risk at Home Any stairs in or around the home?: No If so, are there any without handrails?: No Home free of loose throw rugs in walkways, pet beds, electrical cords, etc?: Yes Adequate lighting in your home to reduce risk of falls?: Yes Life alert?: No Use of a cane, walker or w/c?: Yes Grab bars in the bathroom?: Yes Shower chair or bench in shower?: Yes Elevated toilet seat or a handicapped toilet?: Yes  TIMED UP AND GO:  Was the test performed?  No    Cognitive Function:        08/30/2023    3:46 PM 08/17/2022    4:22 PM 02/17/2021   10:16 AM  6CIT Screen  What Year? 0 points 0 points 0 points  What month? 0 points 0 points 0 points  What time? 0 points 0 points 0 points  Count back from 20 2 points 0 points 4 points  Months in reverse 0 points 0 points 0 points  Repeat phrase 0 points 0 points 0 points  Total Score 2 points 0 points 4 points    Immunizations Immunization History  Administered Date(s) Administered   Fluad Quad(high Dose 65+) 05/13/2019, 06/29/2020, 06/08/2022, 06/20/2023   Influenza, High Dose Seasonal PF 06/22/2015   Influenza,inj,Quad PF,6+ Mos 05/29/2014, 05/30/2016, 05/28/2017, 05/21/2018   Influenza-Unspecified 05/29/2014, 06/23/2015, 05/30/2016, 05/28/2017, 05/21/2018   Moderna Sars-Covid-2 Vaccination 09/24/2019, 10/22/2019, 06/03/2020   PFIZER(Purple Top)SARS-COV-2 Vaccination 06/27/2023   Pfizer(Comirnaty)Fall Seasonal Vaccine 12 years and older 06/14/2022   Pneumococcal  Conjugate-13 01/16/2014   Pneumococcal Polysaccharide-23 07/03/2012, 05/10/2020   Td 09/12/1993   Td (Adult), 2 Lf Tetanus Toxid, Preservative Free 09/12/1993   Tdap 12/21/2012   Zoster Recombinant(Shingrix) 01/04/2020, 03/04/2020   Zoster, Live 09/27/2007    TDAP status: Due, Education has been provided regarding the importance of this vaccine. Advised may receive this vaccine at local pharmacy or Health Dept. Aware to provide a copy of the vaccination record if obtained from local pharmacy or Health Dept. Verbalized acceptance and understanding.  Flu Vaccine status: Up to date  Pneumococcal vaccine status: Up to date  Covid-19 vaccine status: Completed vaccines  Qualifies for Shingles Vaccine? No   Zostavax completed Yes   Shingrix Completed?: Yes  Screening Tests Health Maintenance  Topic Date Due   COVID-19 Vaccine (6 - 2024-25 season) 08/22/2023   DTaP/Tdap/Td (4 - Td or Tdap) 08/29/2024 (Originally 12/22/2022)   Pneumonia Vaccine 44+ Years old  Completed   INFLUENZA VACCINE  Completed   DEXA SCAN  Completed   Zoster Vaccines- Shingrix  Completed   HPV VACCINES  Aged Out    Health Maintenance  Health Maintenance Due  Topic Date Due   COVID-19 Vaccine (6 - 2024-25 season) 08/22/2023    Colorectal cancer screening: No longer required.   Mammogram  status: Completed 2023. Repeat every year  Bone Density status: Completed 2022. Results reflect: Bone density results: OSTEOPENIA. Repeat every 5 years.  Lung Cancer Screening: (Low Dose CT Chest recommended if Age 5-80 years, 20 pack-year currently smoking OR have quit w/in 15years.) does not qualify.   Lung Cancer Screening Referral:   Additional Screening:  Hepatitis C Screening  never done  Vision Screening: Recommended annual ophthalmology exams for early detection of glaucoma and other disorders of the eye. Is the patient up to date with their annual eye exam?  Yes  Who is the provider or what is the name of  the office in which the patient attends annual eye exams? Unsure of name If pt is not established with a provider, would they like to be referred to a provider to establish care? No .   Dental Screening: Recommended annual dental exams for proper oral hygiene    Community Resource Referral / Chronic Care Management: CRR required this visit?  No   CCM required this visit?  No     Plan:     I have personally reviewed and noted the following in the patient's chart:   Medical and social history Use of alcohol, tobacco or illicit drugs  Current medications and supplements including opioid prescriptions. Patient is not currently taking opioid prescriptions. Functional ability and status Nutritional status Physical activity Advanced directives List of other physicians Hospitalizations, surgeries, and ER visits in previous 12 months Vitals Screenings to include cognitive, depression, and falls Referrals and appointments  In addition, I have reviewed and discussed with patient certain preventive protocols, quality metrics, and best practice recommendations. A written personalized care plan for preventive services as well as general preventive health recommendations were provided to patient.     Remi Haggard, LPN   03/47/4259   After Visit Summary: (MyChart) Due to this being a telephonic visit, the after visit summary with patients personalized plan was offered to patient via MyChart   Nurse Notes:

## 2023-08-30 NOTE — Patient Instructions (Signed)
[name] , Thank you for taking time to come for your Medicare Wellness Visit. I appreciate your ongoing commitment to your health goals. Please review the following plan we discussed and let me know if I can assist you in the future.   Screening recommendations/referrals: Colonoscopy: no longer required Mammogram: Education provided Bone Density: up to date Recommended yearly ophthalmology/optometry visit for glaucoma screening and checkup Recommended yearly dental visit for hygiene and checkup  Vaccinations: Influenza vaccine: up to date Pneumococcal vaccine: up to date Tdap vaccine: Education provided     Advanced directives: Education provided    Preventive Care 65 Years and Older, Female Preventive care refers to lifestyle choices and visits with your health care provider that can promote health and wellness. What does preventive care include? A yearly physical exam. This is also called an annual well check. Dental exams once or twice a year. Routine eye exams. Ask your health care provider how often you should have your eyes checked. Personal lifestyle choices, including: Daily care of your teeth and gums. Regular physical activity. Eating a healthy diet. Avoiding tobacco and drug use. Limiting alcohol use. Practicing safe sex. Taking low-dose aspirin every day. Taking vitamin and mineral supplements as recommended by your health care provider. What happens during an annual well check? The services and screenings done by your health care provider during your annual well check will depend on your age, overall health, lifestyle risk factors, and family history of disease. Counseling  Your health care provider may ask you questions about your: Alcohol use. Tobacco use. Drug use. Emotional well-being. Home and relationship well-being. Sexual activity. Eating habits. History of falls. Memory and ability to understand (cognition). Work and work  Astronomer. Reproductive health. Screening  You may have the following tests or measurements: Height, weight, and BMI. Blood pressure. Lipid and cholesterol levels. These may be checked every 5 years, or more frequently if you are over 39 years old. Skin check. Lung cancer screening. You may have this screening every year starting at age 32 if you have a 30-pack-year history of smoking and currently smoke or have quit within the past 15 years. Fecal occult blood test (FOBT) of the stool. You may have this test every year starting at age 71. Flexible sigmoidoscopy or colonoscopy. You may have a sigmoidoscopy every 5 years or a colonoscopy every 10 years starting at age 35. Hepatitis C blood test. Hepatitis B blood test. Sexually transmitted disease (STD) testing. Diabetes screening. This is done by checking your blood sugar (glucose) after you have not eaten for a while (fasting). You may have this done every 1-3 years. Bone density scan. This is done to screen for osteoporosis. You may have this done starting at age 34. Mammogram. This may be done every 1-2 years. Talk to your health care provider about how often you should have regular mammograms. Talk with your health care provider about your test results, treatment options, and if necessary, the need for more tests. Vaccines  Your health care provider may recommend certain vaccines, such as: Influenza vaccine. This is recommended every year. Tetanus, diphtheria, and acellular pertussis (Tdap, Td) vaccine. You may need a Td booster every 10 years. Zoster vaccine. You may need this after age 53. Pneumococcal 13-valent conjugate (PCV13) vaccine. One dose is recommended after age 65. Pneumococcal polysaccharide (PPSV23) vaccine. One dose is recommended after age 62. Talk to your health care provider about which screenings and vaccines you need and how often you need them. This information  is not intended to replace advice given to you by  your health care provider. Make sure you discuss any questions you have with your health care provider. Document Released: 09/24/2015 Document Revised: 05/17/2016 Document Reviewed: 06/29/2015 Elsevier Interactive Patient Education  2017 [employer].  Fall Prevention in the Home Falls can cause injuries. They can happen to people of all ages. There are many things you can do to make your home safe and to help prevent falls. What can I do on the outside of my home? Regularly fix the edges of walkways and driveways and fix any cracks. Remove anything that might make you trip as you walk through a door, such as a raised step or threshold. Trim any bushes or trees on the path to your home. Use bright outdoor lighting. Clear any walking paths of anything that might make someone trip, such as rocks or tools. Regularly check to see if handrails are loose or broken. Make sure that both sides of any steps have handrails. Any raised decks and porches should have guardrails on the edges. Have any leaves, snow, or ice cleared regularly. Use sand or salt on walking paths during winter. Clean up any spills in your garage right away. This includes oil or grease spills. What can I do in the bathroom? Use night lights. Install grab bars by the toilet and in the tub and shower. Do not use towel bars as grab bars. Use non-skid mats or decals in the tub or shower. If you need to sit down in the shower, use a plastic, non-slip stool. Keep the floor dry. Clean up any water that spills on the floor as soon as it happens. Remove soap buildup in the tub or shower regularly. Attach bath mats securely with double-sided non-slip rug tape. Do not have throw rugs and other things on the floor that can make you trip. What can I do in the bedroom? Use night lights. Make sure that you have a light by your bed that is easy to reach. Do not use any sheets or blankets that are too big for your bed. They should not hang  down onto the floor. Have a firm chair that has side arms. You can use this for support while you get dressed. Do not have throw rugs and other things on the floor that can make you trip. What can I do in the kitchen? Clean up any spills right away. Avoid walking on wet floors. Keep items that you use a lot in easy-to-reach places. If you need to reach something above you, use a strong step stool that has a grab bar. Keep electrical cords out of the way. Do not use floor polish or wax that makes floors slippery. If you must use wax, use non-skid floor wax. Do not have throw rugs and other things on the floor that can make you trip. What can I do with my stairs? Do not leave any items on the stairs. Make sure that there are handrails on both sides of the stairs and use them. Fix handrails that are broken or loose. Make sure that handrails are as long as the stairways. Check any carpeting to make sure that it is firmly attached to the stairs. Fix any carpet that is loose or worn. Avoid having throw rugs at the top or bottom of the stairs. If you do have throw rugs, attach them to the floor with carpet tape. Make sure that you have a light switch at the top of  the stairs and the bottom of the stairs. If you do not have them, ask someone to add them for you. What else can I do to help prevent falls? Wear shoes that: Do not have high heels. Have rubber bottoms. Are comfortable and fit you well. Are closed at the toe. Do not wear sandals. If you use a stepladder: Make sure that it is fully opened. Do not climb a closed stepladder. Make sure that both sides of the stepladder are locked into place. Ask someone to hold it for you, if possible. Clearly mark and make sure that you can see: Any grab bars or handrails. First and last steps. Where the edge of each step is. Use tools that help you move around (mobility aids) if they are needed. These  include: Canes. Walkers. Scooters. Crutches. Turn on the lights when you go into a dark area. Replace any light bulbs as soon as they burn out. Set up your furniture so you have a clear path. Avoid moving your furniture around. If any of your floors are uneven, fix them. If there are any pets around you, be aware of where they are. Review your medicines with your doctor. Some medicines can make you feel dizzy. This can increase your chance of falling. Ask your doctor what other things that you can do to help prevent falls. This information is not intended to replace advice given to you by your health care provider. Make sure you discuss any questions you have with your health care provider. Document Released: 06/24/2009 Document Revised: 02/03/2016 Document Reviewed: 10/02/2014 Elsevier Interactive Patient Education  2017 [employer].

## 2023-09-09 ENCOUNTER — Other Ambulatory Visit: Payer: Self-pay | Admitting: Family Medicine

## 2023-09-11 ENCOUNTER — Telehealth: Payer: Self-pay | Admitting: Family Medicine

## 2023-09-11 NOTE — Telephone Encounter (Signed)
 Copied from CRM [id number]. Topic: Referral - Question >> Sep 11, 2023  2:03 PM Elle L wrote: Reason for CRM: The patient was calling regarding her new referral for home health and is requesting a call back at [phone].

## 2023-09-13 ENCOUNTER — Telehealth: Payer: Self-pay

## 2023-09-13 NOTE — Telephone Encounter (Signed)
 [name], Mrs. [name] asked if she can change her referral from [facility] to [facility]. Is this able to be changed? Thank you!

## 2023-09-14 ENCOUNTER — Ambulatory Visit (INDEPENDENT_AMBULATORY_CARE_PROVIDER_SITE_OTHER): Payer: Medicare Other | Admitting: Family Medicine

## 2023-09-14 ENCOUNTER — Encounter: Payer: Self-pay | Admitting: Family Medicine

## 2023-09-14 VITALS — BP 130/88 | HR 80 | Temp 98.3°F | Ht 66.0 in | Wt 202.0 lb

## 2023-09-14 DIAGNOSIS — F3341 Major depressive disorder, recurrent, in partial remission: Secondary | ICD-10-CM | POA: Diagnosis not present

## 2023-09-14 NOTE — Progress Notes (Signed)
 Subjective:    Patient ID: [name], female    DOB: 03-09-1941, 83 y.o.   MRN: [id number] 08/17/23 Patient is a very pleasant 83 year old Caucasian female who presents today requesting assistance with depression.  Her husband passed away in 2023/10/04 of this year.  She has been battling depression ever since.  Certainly approaching the anniversary of this has only worsened the situation.  She states that she does not want to do anything.  She primarily just sits around all day.  She has no desire to go anywhere.  She feels tired all the time but she cannot sleep.  She denies any suicidal thoughts.  She is currently on Prozac  40 mg a day.  In the past she has tried venlafaxine, Zoloft, Rexulti , Abilify  without success.  We also checked her thyroid  in July.  We increased her levothyroxine .  She is due to recheck this today.  On her last fasting lab work, her sugar was elevated at 115.  She has gained weight since that time.  We are due to recheck that as well.   At that time, my plan was: Continue Prozac  40 mg a day but add Wellbutrin  extended release 150 mg a day.  Reassess in 6 weeks.  I tried to convince the patient to avoid medicines like Xanax  and sleeping pills as these can increase her risk of falls memory loss.  She is already reporting trouble with her memory and her concentration.  While the patient is here today I will recheck a TSH to ensure the correct dosage of levothyroxine  and I will check a hemoglobin A1c to monitor her blood sugar.  09/14/23 Patient is doing much better since we added Wellbutrin  to Prozac .  She states that she is sleeping at night.  She feels happier.  The depression has improved.  She denies any side effects from the medication.  She denies any anxiety or panic attacks.  She denies anhedonia.  Patient has stopped all sleeping pills such as trazodone . Past Medical History:  Diagnosis Date   ADD (attention deficit disorder)    Allergic rhinitis    Anemia     Anxiety    Arrhythmia    Arthritis    hands, neck, right shoulder (07/28/2015)   Aseptic necrosis (HCC)    Asthma dxc'd 05/2015   Basal cell cancer    Bleeding stomach ulcer ~ 06/2005   COPD (chronic obstructive pulmonary disease) (HCC)    Depression    Diverticula of colon    Dyspnea    Esophagitis    Gastric ulcer with hemorrhage    GERD (gastroesophageal reflux disease)    Heart murmur    History of blood transfusion 06/2005   related to pneumonia   History of kidney stones    Hyperlipidemia    Hypertension    Hypothyroidism    Kidney stones    Lung cancer (HCC)    Non small cell lung cancer, adenocarcinoma with BAC 2009/10/03, VATS converted to wedge resection of left upper lobe mass   Mitral valve prolapse    no ORs (07/28/2015)   Osteopenia    Pneumonia 1970's; 06/2005   Sleep apnea dx'd 06/2015   on oxygen  at nite at 2L/[place]    Squamous cell skin cancer    Past Surgical History:  Procedure Laterality Date   ABDOMINAL HYSTERECTOMY  1988   COLONOSCOPY WITH PROPOFOL  N/A 06/15/2017   Procedure: COLONOSCOPY WITH PROPOFOL ;  Surgeon: Rollin Dover, MD;  Location: [facility];  Service:  Endoscopy;  Laterality: N/A;   HAMMER TOE SURGERY Right ~ 2010   SALPINGOOPHORECTOMY Left 1988   SKIN CANCER EXCISION     I've had severl cut off RLE; left foreqrm, nose under nose   THORACOTOMY Left 06/2005   THORACOTOMY/LOBECTOMY  09/2009; 11/2009   left; right   Current Outpatient Medications on File Prior to Visit  Medication Sig Dispense Refill   albuterol  (VENTOLIN  HFA) 108 (90 Base) MCG/ACT inhaler Inhale 1 puff into the lungs every 6 (six) hours as needed for wheezing or shortness of breath. 8 g 2   buPROPion  (WELLBUTRIN  XL) 150 MG 24 hr tablet TAKE 1 TABLET BY MOUTH EVERY DAY 90 tablet 2   Calcium  Carbonate-Vit D-Min (CALCIUM  1200 PO) Take 50 mg by mouth. ONCE A WEEK     Cyanocobalamin 1000 MCG CAPS Take by mouth.     FLUoxetine  (PROZAC ) 20 MG tablet Take 2 tablets (40 mg  total) by mouth daily. Stop duloxetine  180 tablet 2   fluticasone  (FLONASE ) 50 MCG/ACT nasal spray Place 2 sprays into both nostrils daily. 48 g 2   Fluticasone -Umeclidin-Vilant (TRELEGY ELLIPTA ) 100-62.5-25 MCG/ACT AEPB Inhale 1 puff into the lungs daily. 1 each 5   levothyroxine  (SYNTHROID ) 125 MCG tablet Take 1 tablet (125 mcg total) by mouth daily. 90 tablet 2   losartan -hydrochlorothiazide  (HYZAAR ) 100-25 MG tablet Take 1 tablet by mouth daily. 90 tablet 2   OXYGEN  2lpm with sleep     rosuvastatin  (CRESTOR ) 20 MG tablet Take 1 tablet (20 mg total) by mouth daily. 90 tablet 2   thiamine (VITAMIN B-1) 100 MG tablet Take 100 mg by mouth daily.     traZODone  (DESYREL ) 50 MG tablet Take 1 tablet (50 mg total) by mouth at bedtime. 90 tablet 3   No current facility-administered medications on file prior to visit.    Allergies  Allergen Reactions   Sulfonamide Derivatives Hives   Amoxicillin  Diarrhea    Diarrhea, doesnt tolerate    Doxycycline Nausea Only   Erythromycin  Nausea Only   Social History   Socioeconomic History   Marital status: Married    Spouse name: Not on file   Number of children: 3   Years of education: Not on file   Highest education level: Not on file  Occupational History   Occupation: retired Academic Librarian: RETIRED  Tobacco Use   Smoking status: Former    Current packs/day: 0.00    Average packs/day: 0.8 packs/day for 30.0 years (22.5 ttl pk-yrs)    Types: Cigarettes    Start date: 09/11/1974    Quit date: 09/11/2004    Years since quitting: 19.0   Smokeless tobacco: Never   Tobacco comments:    Pt is vaping x 1.5 years.  Contains nicotine  Vaping Use   Vaping status: Never Used  Substance and Sexual Activity   Alcohol use: Yes    Alcohol/week: 7.0 standard drinks of alcohol    Types: 7 Shots of liquor per week    Comment: 1-2 drinks nightly    Drug use: No   Sexual activity: Yes  Other Topics Concern   Not on file  Social History Narrative    Not on file   Social Drivers of Health   Financial Resource Strain: Low Risk  (08/30/2023)   Overall Financial Resource Strain (CARDIA)    Difficulty of Paying Living Expenses: Not hard at all  Food Insecurity: No Food Insecurity (08/30/2023)   Hunger Vital Sign    Worried  About Running Out of Food in the Last Year: Never true    Ran Out of Food in the Last Year: Never true  Transportation Needs: No Transportation Needs (08/30/2023)   PRAPARE - Administrator, Civil Service (Medical): No    Lack of Transportation (Non-Medical): No  Physical Activity: Inactive (08/30/2023)   Exercise Vital Sign    Days of Exercise per Week: 0 days    Minutes of Exercise per Session: 0 min  Stress: No Stress Concern Present (08/30/2023)   [employer] of Occupational Health - Occupational Stress Questionnaire    Feeling of Stress : Only a little  Social Connections: Socially Isolated (08/30/2023)   Social Connection and Isolation Panel [NHANES]    Frequency of Communication with Friends and Family: More than three times a week    Frequency of Social Gatherings with Friends and Family: Once a week    Attends Religious Services: Never    Database Administrator or Organizations: No    Attends Banker Meetings: Never    Marital Status: Widowed  Intimate Partner Violence: Not At Risk (08/30/2023)   Humiliation, Afraid, Rape, and Kick questionnaire    Fear of Current or Ex-Partner: No    Emotionally Abused: No    Physically Abused: No    Sexually Abused: No      Review of Systems  All other systems reviewed and are negative.      Objective:   Physical Exam Vitals reviewed.  Constitutional:      General: She is not in acute distress.    Appearance: Normal appearance. She is normal weight. She is not ill-appearing or toxic-appearing.  Cardiovascular:     Rate and Rhythm: Normal rate and regular rhythm.     Heart sounds: Normal heart sounds.  Pulmonary:      Effort: Pulmonary effort is normal.     Breath sounds: Normal breath sounds. No wheezing, rhonchi or rales.  Abdominal:     General: Bowel sounds are normal.     Palpations: Abdomen is soft.  Musculoskeletal:     Right lower leg: No edema.     Left lower leg: No edema.  Neurological:     Mental Status: She is alert.           Assessment & Plan:  Recurrent major depressive disorder, in partial remission [facility]) Patient states that she feels much better.  I am very happy to hear that.  Over the last 3 years, as her husband's condition deteriorated, her depression had worsened.  Now the patient is starting to improve.  We will continue her current medications at the present dosages.  Recheck in 6 months.

## 2023-09-24 ENCOUNTER — Encounter: Payer: Self-pay | Admitting: Family Medicine

## 2023-09-24 ENCOUNTER — Other Ambulatory Visit: Payer: Self-pay

## 2023-09-24 DIAGNOSIS — F411 Generalized anxiety disorder: Secondary | ICD-10-CM

## 2023-09-24 DIAGNOSIS — R2689 Other abnormalities of gait and mobility: Secondary | ICD-10-CM

## 2023-09-24 DIAGNOSIS — J449 Chronic obstructive pulmonary disease, unspecified: Secondary | ICD-10-CM

## 2023-09-24 DIAGNOSIS — R0609 Other forms of dyspnea: Secondary | ICD-10-CM

## 2023-09-24 DIAGNOSIS — M51369 Other intervertebral disc degeneration, lumbar region without mention of lumbar back pain or lower extremity pain: Secondary | ICD-10-CM

## 2023-09-24 MED ORDER — BUPROPION HCL ER (XL) 150 MG PO TB24: 150.0000 mg | ORAL_TABLET | Freq: Every day | ORAL | 2 refills | Status: DC

## 2023-09-27 DIAGNOSIS — M19041 Primary osteoarthritis, right hand: Secondary | ICD-10-CM | POA: Diagnosis not present

## 2023-09-27 DIAGNOSIS — F909 Attention-deficit hyperactivity disorder, unspecified type: Secondary | ICD-10-CM | POA: Diagnosis not present

## 2023-09-27 DIAGNOSIS — M19011 Primary osteoarthritis, right shoulder: Secondary | ICD-10-CM | POA: Diagnosis not present

## 2023-09-27 DIAGNOSIS — D649 Anemia, unspecified: Secondary | ICD-10-CM | POA: Diagnosis not present

## 2023-09-27 DIAGNOSIS — J4489 Other specified chronic obstructive pulmonary disease: Secondary | ICD-10-CM | POA: Diagnosis not present

## 2023-09-27 DIAGNOSIS — I341 Nonrheumatic mitral (valve) prolapse: Secondary | ICD-10-CM | POA: Diagnosis not present

## 2023-09-27 DIAGNOSIS — F419 Anxiety disorder, unspecified: Secondary | ICD-10-CM | POA: Diagnosis not present

## 2023-09-27 DIAGNOSIS — F331 Major depressive disorder, recurrent, moderate: Secondary | ICD-10-CM | POA: Diagnosis not present

## 2023-09-27 DIAGNOSIS — M51369 Other intervertebral disc degeneration, lumbar region without mention of lumbar back pain or lower extremity pain: Secondary | ICD-10-CM | POA: Diagnosis not present

## 2023-09-27 DIAGNOSIS — K5730 Diverticulosis of large intestine without perforation or abscess without bleeding: Secondary | ICD-10-CM | POA: Diagnosis not present

## 2023-09-27 DIAGNOSIS — M19042 Primary osteoarthritis, left hand: Secondary | ICD-10-CM | POA: Diagnosis not present

## 2023-09-27 DIAGNOSIS — I10 Essential (primary) hypertension: Secondary | ICD-10-CM | POA: Diagnosis not present

## 2023-10-15 ENCOUNTER — Encounter: Payer: Self-pay | Admitting: Family Medicine

## 2023-10-17 ENCOUNTER — Encounter (HOSPITAL_COMMUNITY): Payer: Self-pay | Admitting: Emergency Medicine

## 2023-10-17 ENCOUNTER — Other Ambulatory Visit: Payer: Self-pay

## 2023-10-17 ENCOUNTER — Encounter: Payer: Self-pay | Admitting: Family Medicine

## 2023-10-17 ENCOUNTER — Emergency Department (HOSPITAL_COMMUNITY)
Admission: EM | Admit: 2023-10-17 | Discharge: 2023-10-17 | Disposition: A | Discharge status: Home / Self Care | Payer: Medicare Other | Attending: Emergency Medicine | Admitting: Emergency Medicine

## 2023-10-17 ENCOUNTER — Ambulatory Visit: Payer: Self-pay | Admitting: Family Medicine

## 2023-10-17 ENCOUNTER — Ambulatory Visit (INDEPENDENT_AMBULATORY_CARE_PROVIDER_SITE_OTHER): Payer: Medicare Other | Admitting: Family Medicine

## 2023-10-17 ENCOUNTER — Emergency Department (HOSPITAL_COMMUNITY): Payer: Medicare Other

## 2023-10-17 ENCOUNTER — Other Ambulatory Visit: Payer: Self-pay | Admitting: Family Medicine

## 2023-10-17 VITALS — BP 134/84 | HR 72 | Temp 98.5°F | Ht 66.0 in | Wt 202.1 lb

## 2023-10-17 DIAGNOSIS — Z7951 Long term (current) use of inhaled steroids: Secondary | ICD-10-CM | POA: Diagnosis not present

## 2023-10-17 DIAGNOSIS — R0602 Shortness of breath: Secondary | ICD-10-CM

## 2023-10-17 DIAGNOSIS — R0781 Pleurodynia: Secondary | ICD-10-CM | POA: Diagnosis not present

## 2023-10-17 DIAGNOSIS — Z20822 Contact with and (suspected) exposure to covid-19: Secondary | ICD-10-CM | POA: Insufficient documentation

## 2023-10-17 DIAGNOSIS — I700 Atherosclerosis of aorta: Secondary | ICD-10-CM | POA: Diagnosis not present

## 2023-10-17 DIAGNOSIS — M25511 Pain in right shoulder: Secondary | ICD-10-CM | POA: Insufficient documentation

## 2023-10-17 DIAGNOSIS — J441 Chronic obstructive pulmonary disease with (acute) exacerbation: Secondary | ICD-10-CM | POA: Insufficient documentation

## 2023-10-17 DIAGNOSIS — J209 Acute bronchitis, unspecified: Secondary | ICD-10-CM | POA: Insufficient documentation

## 2023-10-17 DIAGNOSIS — D72829 Elevated white blood cell count, unspecified: Secondary | ICD-10-CM | POA: Insufficient documentation

## 2023-10-17 DIAGNOSIS — C3480 Malignant neoplasm of overlapping sites of unspecified bronchus and lung: Secondary | ICD-10-CM

## 2023-10-17 DIAGNOSIS — J439 Emphysema, unspecified: Secondary | ICD-10-CM | POA: Diagnosis not present

## 2023-10-17 LAB — BRAIN NATRIURETIC PEPTIDE: B Natriuretic Peptide: 52.9 pg/mL (ref 0.0–100.0)

## 2023-10-17 LAB — HEPATIC FUNCTION PANEL
ALT: 25 U/L (ref 0–44)
AST: 29 U/L (ref 15–41)
Albumin: 4.1 g/dL (ref 3.5–5.0)
Alkaline Phosphatase: 53 U/L (ref 38–126)
Bilirubin, Direct: 0.2 mg/dL (ref 0.0–0.2)
Indirect Bilirubin: 0.3 mg/dL (ref 0.3–0.9)
Total Bilirubin: 0.5 mg/dL (ref 0.0–1.2)
Total Protein: 6.5 g/dL (ref 6.5–8.1)

## 2023-10-17 LAB — CBC WITH DIFFERENTIAL/PLATELET
Abs Immature Granulocytes: 0.03 10*3/uL (ref 0.00–0.07)
Basophils Absolute: 0.1 10*3/uL (ref 0.0–0.1)
Basophils Relative: 1 %
Eosinophils Absolute: 0.1 10*3/uL (ref 0.0–0.5)
Eosinophils Relative: 1 %
HCT: 45.6 % (ref 36.0–46.0)
Hemoglobin: 15.1 g/dL — ABNORMAL HIGH (ref 12.0–15.0)
Immature Granulocytes: 0 %
Lymphocytes Relative: 17 %
Lymphs Abs: 2 10*3/uL (ref 0.7–4.0)
MCH: 32.7 pg (ref 26.0–34.0)
MCHC: 33.1 g/dL (ref 30.0–36.0)
MCV: 98.7 fL (ref 80.0–100.0)
Monocytes Absolute: 1.2 10*3/uL — ABNORMAL HIGH (ref 0.1–1.0)
Monocytes Relative: 10 %
Neutro Abs: 8.2 10*3/uL — ABNORMAL HIGH (ref 1.7–7.7)
Neutrophils Relative %: 71 %
Platelets: 301 10*3/uL (ref 150–400)
RBC: 4.62 MIL/uL (ref 3.87–5.11)
RDW: 13.3 % (ref 11.5–15.5)
WBC: 11.6 10*3/uL — ABNORMAL HIGH (ref 4.0–10.5)
nRBC: 0 % (ref 0.0–0.2)

## 2023-10-17 LAB — BASIC METABOLIC PANEL
Anion gap: 11 (ref 5–15)
BUN: 17 mg/dL (ref 8–23)
CO2: 25 mmol/L (ref 22–32)
Calcium: 10.2 mg/dL (ref 8.9–10.3)
Chloride: 99 mmol/L (ref 98–111)
Creatinine, Ser: 1.41 mg/dL — ABNORMAL HIGH (ref 0.44–1.00)
GFR, Estimated: 37 mL/min — ABNORMAL LOW (ref >60)
Glucose, Bld: 93 mg/dL (ref 70–99)
Potassium: 3.6 mmol/L (ref 3.5–5.1)
Sodium: 135 mmol/L (ref 135–145)

## 2023-10-17 LAB — RESP PANEL BY RT-PCR (RSV, FLU A&B, COVID)  RVPGX2
Influenza A by PCR: NEGATIVE
Influenza B by PCR: NEGATIVE
Resp Syncytial Virus by PCR: NEGATIVE
SARS Coronavirus 2 by RT PCR: NEGATIVE

## 2023-10-17 LAB — D-DIMER, QUANTITATIVE: D-Dimer, Quant: 0.48 ug{FEU}/mL (ref 0.00–0.50)

## 2023-10-17 LAB — I-STAT CG4 LACTIC ACID, ED: Lactic Acid, Venous: 1.7 mmol/L (ref 0.5–1.9)

## 2023-10-17 MED ORDER — TRAZODONE HCL 50 MG PO TABS: 50.0000 mg | ORAL_TABLET | Freq: Every day | ORAL | 2 refills | Status: DC

## 2023-10-17 MED ORDER — PREDNISONE 10 MG (21) PO TBPK: ORAL_TABLET | ORAL | 0 refills | Status: DC

## 2023-10-17 MED ORDER — PREDNISONE 20 MG PO TABS: 40.0000 mg | ORAL_TABLET | Freq: Every day | ORAL | 0 refills | Status: DC

## 2023-10-17 MED ORDER — AZITHROMYCIN 250 MG PO TABS: ORAL_TABLET | ORAL | 0 refills | Status: AC

## 2023-10-17 MED ORDER — METHYLPREDNISOLONE SODIUM SUCC 125 MG IJ SOLR
125.0000 mg | Freq: Once | INTRAMUSCULAR | Status: AC
  Administered 2023-10-17: 125 mg via INTRAVENOUS
  Filled 2023-10-17: qty 2

## 2023-10-17 NOTE — ED Provider Triage Note (Signed)
 Emergency Medicine Provider Triage Evaluation Note  Taletha Twiford , a 83 y.o. female with history of lung cancer and COPD on 2 L nasal cannula was evaluated in triage.  Pt complains of shortness of breath, right rib and scapular pain is worsened over the past week.  No injury or trauma.  Evaluated at PCP earlier today referred here.  Review of Systems  Positive:  Negative:   Physical Exam  BP (!) 141/122   Pulse 79   Temp 98.2 F (36.8 C) (Oral)   Resp (!) 24   SpO2 94%  Gen:   Awake, no distress   Resp:  Normal effort, lung sounds are clear, satting 95% on 2 L, MSK:   Moves extremities without difficulty  Other:  No significant rib tenderness, abdomen without tenderness  Medical Decision Making  Medically screening exam initiated at 4:22 PM.  Appropriate orders placed.  Elsy Chiang was informed that the remainder of the evaluation will be completed by another provider, this initial triage assessment does not replace that evaluation, and the importance of remaining in the ED until their evaluation is complete.     Donnajean Lynwood DEL, PA-C 10/17/23 1624

## 2023-10-17 NOTE — Telephone Encounter (Signed)
 Prescription Request  10/17/2023  LOV: 09/14/2023  What is the name of the medication or equipment?   traZODone  (DESYREL ) 50 MG tablet  **new script requested**    Have you contacted your pharmacy to request a refill? Yes   Which pharmacy would you like this sent to?  [id number] [place], [place] - [address] [place] [address] Phone: [phone] Fax: [phone]    Patient notified that their request is being sent to the clinical staff for review and that they should receive a response within 2 business days.   Please advise pharmacist.

## 2023-10-17 NOTE — ED Provider Notes (Signed)
 [place] EMERGENCY DEPARTMENT AT [facility] Provider Note   CSN: [id number] Arrival date & time: 10/17/23  1539     History  Chief Complaint  Patient presents with   Shortness of Breath    [name] is a 83 y.o. female.  HPI Patient presents with dyspnea.  She is here with her daughter who assists with the history. Patient has dyspnea, with some right rib, scapular pain, over about 1 week. She saw her physician and was referred here.  History is notable for COPD.  No fever, no vomiting.    Home Medications Prior to Admission medications   Medication Sig Start Date End Date Taking? Authorizing Provider  albuterol  (VENTOLIN  HFA) 108 (90 Base) MCG/ACT inhaler Inhale 1 puff into the lungs every 6 (six) hours as needed for wheezing or shortness of breath. 06/14/23  Yes Duanne Butler DASEN, MD  buPROPion  (WELLBUTRIN  XL) 150 MG 24 hr tablet Take 1 tablet (150 mg total) by mouth daily. 09/24/23  Yes Duanne Butler DASEN, MD  CALCIUM  PO Take 1,200 mg by mouth every other day.   Yes [provider]  Cholecalciferol (VITAMIN D3) 1.25 MG (50000 UT) CAPS Take 50,000 Units by mouth every Sunday.   Yes [provider]  Cyanocobalamin 1000 MCG CAPS Take 1,000 mcg by mouth daily.   Yes [provider]  FLUoxetine  (PROZAC ) 20 MG tablet Take 2 tablets (40 mg total) by mouth daily. Stop duloxetine  Patient taking differently: Take 40 mg by mouth daily. 06/14/23  Yes Duanne Butler DASEN, MD  fluticasone  (FLONASE ) 50 MCG/ACT nasal spray Place 2 sprays into both nostrils daily. 06/14/23  Yes Duanne Butler DASEN, MD  Fluticasone -Umeclidin-Vilant (TRELEGY ELLIPTA ) 100-62.5-25 MCG/ACT AEPB Inhale 1 puff into the lungs daily. 06/14/23  Yes Duanne Butler DASEN, MD  levothyroxine  (SYNTHROID ) 125 MCG tablet Take 1 tablet (125 mcg total) by mouth daily. Patient taking differently: Take 125 mcg by mouth daily before breakfast. 06/14/23  Yes Pickard, Butler DASEN, MD   losartan -hydrochlorothiazide  (HYZAAR ) 100-25 MG tablet Take 1 tablet by mouth daily. 06/14/23  Yes Duanne Butler DASEN, MD  naproxen sodium (ALEVE) 220 MG tablet Take 220-440 mg by mouth 2 (two) times daily as needed (for pain- TAKE WITH FOOD).   Yes [provider]  OXYGEN  Inhale 2 L/min into the lungs See admin instructions. Inhale 2 L/min into the lungs at bedtime and during the day as needed for shortness of breath   Yes [provider]  predniSONE  (DELTASONE ) 20 MG tablet Take 2 tablets (40 mg total) by mouth daily with breakfast. For the next four days 10/17/23  Yes Garrick Charleston, MD  rosuvastatin  (CRESTOR ) 20 MG tablet Take 1 tablet (20 mg total) by mouth daily. 06/14/23  Yes Duanne Butler DASEN, MD  traZODone  (DESYREL ) 50 MG tablet Take 1 tablet (50 mg total) by mouth at bedtime. 10/17/23  Yes Duanne Butler DASEN, MD  TYLENOL  500 MG tablet Take 500-1,000 mg by mouth every 6 (six) hours as needed (for rib pain).   Yes [provider]  azithromycin  (ZITHROMAX ) 250 MG tablet Take 2 tablets on day 1, then 1 tablet daily on days 2 through 5 Patient not taking: Reported on 10/17/2023 10/17/23 10/22/23  Aletha Bene, MD  predniSONE  (STERAPRED UNI-PAK 21 TAB) 10 MG (21) TBPK tablet Use as directed. Patient not taking: Reported on 10/17/2023 10/17/23   Aletha Bene, MD      Allergies    Sulfonamide derivatives, Erythromycin , Amoxicillin , and Doxycycline  Review of Systems   Review of Systems  Physical Exam Updated Vital Signs BP (!) 173/86   Pulse 72   Temp 98 F (36.7 C)   Resp 18   SpO2 99%  Physical Exam Vitals and nursing note reviewed.  Constitutional:      General: She is not in acute distress.    Appearance: She is well-developed.  HENT:     Head: Normocephalic and atraumatic.  Eyes:     Conjunctiva/sclera: Conjunctivae normal.  Cardiovascular:     Rate and Rhythm: Normal rate and regular rhythm.  Pulmonary:     Effort: Pulmonary effort is normal.  Tachypnea present.     Breath sounds: Decreased breath sounds present.  Abdominal:     General: There is no distension.  Skin:    General: Skin is warm and dry.  Neurological:     Mental Status: She is alert and oriented to person, place, and time.     Cranial Nerves: No cranial nerve deficit.  Psychiatric:        Mood and Affect: Mood normal.     ED Results / Procedures / Treatments   Labs (all labs ordered are listed, but only abnormal results are displayed) Labs Reviewed  CBC WITH DIFFERENTIAL/PLATELET - Abnormal; Notable for the following components:      Result Value   WBC 11.6 (*)    Hemoglobin 15.1 (*)    Neutro Abs 8.2 (*)    Monocytes Absolute 1.2 (*)    All other components within normal limits  BASIC METABOLIC PANEL - Abnormal; Notable for the following components:   Creatinine, Ser 1.41 (*)    GFR, Estimated 37 (*)    All other components within normal limits  RESP PANEL BY RT-PCR (RSV, FLU A&B, COVID)  RVPGX2  BRAIN NATRIURETIC PEPTIDE  HEPATIC FUNCTION PANEL  D-DIMER, QUANTITATIVE  I-STAT CG4 LACTIC ACID, ED    EKG EKG Interpretation Date/Time:  Wednesday October 17 2023 16:02:11 EST Ventricular Rate:  87 PR Interval:  88 QRS Duration:  116 QT Interval:  433 QTC Calculation: 521 R Axis:   -20  Text Interpretation: Sinus rhythm Short PR interval Incomplete right bundle branch block Baseline wander Artifact Confirmed by Garrick Charleston [phone]) on 10/17/2023 4:27:39 PM  Radiology DG Chest 2 View Result Date: 10/17/2023 CLINICAL DATA:  Shortness of breath. EXAM: CHEST - 2 VIEW COMPARISON:  Chest radiograph dated 07/06/2022. FINDINGS: Background of emphysema and left mid lung field scarring. No new consolidation. There is no pleural effusion pneumothorax. Stable cardiac silhouette. Atherosclerotic calcification of the aorta. No acute osseous pathology. IMPRESSION: 1. No active cardiopulmonary disease. 2. Emphysema and left mid lung field scarring.  Electronically Signed   By: Vanetta Chou M.D.   On: 10/17/2023 17:42    Procedures Procedures    Medications Ordered in ED Medications  methylPREDNISolone  sodium succinate (SOLU-MEDROL ) 125 mg/2 mL injection 125 mg (has no administration in time range)    ED Course/ Medical Decision Making/ A&P                                 Medical Decision Making Adult female with COPD presents with dyspnea, and where she typically uses oxygen  intermittently, new oxygen  requirement of 2 L nasal cannula.  Patient is awake, alert, afebrile, not hypotensive, no evidence of bacteremia, sepsis, concern for pneumonia versus viral process versus COPD exacerbation.  Amount and/or Complexity of Data Reviewed Independent  Historian:     Details: Daughter External Data Reviewed: notes. Labs: ordered. Decision-making details documented in ED Course. Radiology: ordered and independent interpretation performed. Decision-making details documented in ED Course. ECG/medicine tests: ordered and independent interpretation performed. Decision-making details documented in ED Course.  Risk Prescription drug management. Decision regarding hospitalization. Diagnosis or treatment significantly limited by social determinants of health.  Pulse ox 96% 2 L nasal cannula abnormal Cardiac 75 sinus normal  9:35 PM Patient awake, alert, we discussed all findings, including mild leukocytosis.  Patient has no urinary symptoms but states that she is worried about an occult urinary tract infection.  We discussed her x-ray, viral panel, labs, all generally reassuring aside from trace leukocytosis. We discussed admission versus discharge with close outpatient follow-up.  Patient has a strong preference for the latter, given her stable condition here with 2 L nasal cannula, absence of hemodynamic instability, patient discharged with ongoing treatment for COPD exacerbation to follow-up with primary care.        Final  Clinical Impression(s) / ED Diagnoses Final diagnoses:  COPD exacerbation (HCC)    Rx / DC Orders ED Discharge Orders          Ordered    predniSONE  (DELTASONE ) 20 MG tablet  Daily with breakfast        10/17/23 2135              Garrick Charleston, MD 10/17/23 2135

## 2023-10-17 NOTE — Telephone Encounter (Signed)
 Copied from CRM [id number]. Topic: Clinical - Red Word Triage >> Oct 17, 2023  8:23 AM Ivette P wrote: Red Word that prompted transfer to Nurse Triage: short of breath, using oxygen  tank nonstop. when stand gasping for breath. when walking sweating real bad. oxygen  real low.  Chief Complaint: shortness of breath Symptoms: using O2 at home, sob, runny nose Frequency: constant Pertinent Negatives: Patient denies fever, sore throat Disposition: [] ED /[] Urgent Care (no appt availability in office) / [x] Appointment(In office/virtual)/ []  [place] [facility]/ [] Home Care/ [] Refused Recommended Disposition /[]  Mobile Bus/ []  Follow-up with PCP Additional Notes: States sob worsening over past two weeks and has  been using home o2.  Apt made for this am.  Instructed to go to the er if becomes worse. PCP office updated.  Reason for Disposition  [1] Longstanding difficulty breathing (e.g., CHF, COPD, emphysema) AND [2] WORSE than normal  Answer Assessment - Initial Assessment Questions 1. RESPIRATORY STATUS: Describe your breathing? (e.g., wheezing, shortness of breath, unable to speak, severe coughing)      Shortness of breath for the last two weeks 2. ONSET: When did this breathing problem begin?      Two weeks ago 3. PATTERN Does the difficult breathing come and go, or has it been constant since it started?      constant 4. SEVERITY: How bad is your breathing? (e.g., mild, moderate, severe)    - MILD: No SOB at rest, mild SOB with walking, speaks normally in sentences, can lie down, no retractions, pulse < 100.    - MODERATE: SOB at rest, SOB with minimal exertion and prefers to sit, cannot lie down flat, speaks in phrases, mild retractions, audible wheezing, pulse 100-120.    - SEVERE: Very SOB at rest, speaks in single words, struggling to breathe, sitting hunched forward, retractions, pulse > 120      Moderate 5. RECURRENT SYMPTOM: Have you had difficulty breathing before?  If Yes, ask: When was the last time? and What happened that time?      COPD 6. CARDIAC HISTORY: Do you have any history of heart disease? (e.g., heart attack, angina, bypass surgery, angioplasty)      denies 7. LUNG HISTORY: Do you have any history of lung disease?  (e.g., pulmonary embolus, asthma, emphysema)     copd 8. CAUSE: What do you think is causing the breathing problem?      unknown 9. OTHER SYMPTOMS: Do you have any other symptoms? (e.g., dizziness, runny nose, cough, chest pain, fever)     Runny nose 10. O2 SATURATION MONITOR:  Do you use an oxygen  saturation monitor (pulse oximeter) at home? If Yes, ask: What is your reading (oxygen  level) today? What is your usual oxygen  saturation reading? (e.g., 95%)       94-96% 11. PREGNANCY: Is there any chance you are pregnant? When was your last menstrual period?       na 12. TRAVEL: Have you traveled out of the country in the last month? (e.g., travel history, exposures)       na  Protocols used: Breathing Difficulty-A-AH

## 2023-10-17 NOTE — ED Notes (Addendum)
 Pt ox sat at 87%, placed on 2 L [place] with rebound to 96%

## 2023-10-17 NOTE — Progress Notes (Signed)
 Patient Office Visit  Assessment & Plan:  Acute bronchitis, unspecified organism -     Azithromycin ; Take 2 tablets on day 1, then 1 tablet daily on days 2 through 5 (Patient not taking: Reported on 10/17/2023)  Dispense: 6 tablet; Refill: 0 -     predniSONE ; Use as directed. (Patient not taking: Reported on 10/17/2023)  Dispense: 21 each; Refill: 0  COPD exacerbation (HCC) -     Azithromycin ; Take 2 tablets on day 1, then 1 tablet daily on days 2 through 5 (Patient not taking: Reported on 10/17/2023)  Dispense: 6 tablet; Refill: 0 -     predniSONE ; Use as directed. (Patient not taking: Reported on 10/17/2023)  Dispense: 21 each; Refill: 0  Malignant neoplasm of overlapping sites of lung, unspecified laterality (HCC)  Shortness of breath -     predniSONE ; Use as directed. (Patient not taking: Reported on 10/17/2023)  Dispense: 21 each; Refill: 0   After extensive discussion with patient/daughter and caregiver they decided they will take patient to [facility] ER for further evaluation.  Return if symptoms worsen or fail to improve.   Subjective:   EPIC not working during office visit-  83 year old with PMH significant for bilateral lung CA in 2010 (40% lung tissue removed per daughter) having COPD exacerbation/worsening SOB, wheezing and cough for the past 2 weeks. Pt comes in with daughter and caretaker.  Pt has been feeling sick for the past 2 weeks, got worse last night. Without her oxygen  pulse ox goes down to 86%. Pt has hx of COPD, lung CA and nocturnal hypoxia requiring oxygen  at home (has 2 L oxygen  via [place] on today). Pt does see Dr.Clint Young Pulm specialist in [place]. Per patient she does not use oxygen  all the time but caregiver states she is having to use oxygen  most of the time. Pt is using Trelegy inhaler daily and does have albuterol  inhaler on hand but does not think it helps her. Pt using 2 L of oxygen  when she moves around or at night time per patient. No fever or chills, no flu like  symptoms. Pt did not test for COVID at home but daughter states she will do this. Pt having fatigue, and believes she is not thinking as clearly as her usual baseline.  Daughter wonders if she is having TIAs.  Former smoker, quit about 2 years ago.  Fall- pt had a fall in the shower over 2 weeks ago but does not think she broke any ribs. Pt not having any bruising.   Patient ID: [name], female    DOB: 1940/12/20  Age: 83 y.o. MRN: [id number]  Chief Complaint  Patient presents with   Shortness of Breath    X 2 weeks. W/o oxygen  stats have been down to 86.   Fall    Fell 2 weeks. Pt does not remember falling. Soreness under breasts.     Shortness of Breath  Fall     The ASCVD Risk score (Arnett DK, et al., 2019) failed to calculate for the following reasons:   The 2019 ASCVD risk score is only valid for ages 82 to 14  Past Medical History:  Diagnosis Date   ADD (attention deficit disorder)    Allergic rhinitis    Anemia    Anxiety    Arrhythmia    Arthritis    hands, neck, right shoulder (07/28/2015)   Aseptic necrosis (HCC)    Asthma dxc'd 05/2015   Basal cell cancer  Bleeding stomach ulcer ~ 06/2005   COPD (chronic obstructive pulmonary disease) (HCC)    Depression    Diverticula of colon    Dyspnea    Esophagitis    Gastric ulcer with hemorrhage    GERD (gastroesophageal reflux disease)    Heart murmur    History of blood transfusion 06/2005   related to pneumonia   History of kidney stones    Hyperlipidemia    Hypertension    Hypothyroidism    Kidney stones    Lung cancer (HCC)    Non small cell lung cancer, adenocarcinoma with BAC 09/2009, VATS converted to wedge resection of left upper lobe mass   Mitral valve prolapse    no ORs (07/28/2015)   Osteopenia    Pneumonia 1970's; 06/2005   Sleep apnea dx'd 06/2015   on oxygen  at nite at 2L/[place]    Squamous cell skin cancer    Past Surgical History:  Procedure Laterality Date   ABDOMINAL  HYSTERECTOMY  1988   COLONOSCOPY WITH PROPOFOL  N/A 06/15/2017   Procedure: COLONOSCOPY WITH PROPOFOL ;  Surgeon: Rollin Dover, MD;  Location: [facility];  Service: Endoscopy;  Laterality: N/A;   HAMMER TOE SURGERY Right ~ 2010   SALPINGOOPHORECTOMY Left 1988   SKIN CANCER EXCISION     I've had severl cut off RLE; left foreqrm, nose under nose   THORACOTOMY Left 06/2005   THORACOTOMY/LOBECTOMY  09/2009; 11/2009   left; right   Social History   Tobacco Use   Smoking status: Former    Current packs/day: 0.00    Average packs/day: 0.8 packs/day for 30.0 years (22.5 ttl pk-yrs)    Types: Cigarettes    Start date: 09/11/1974    Quit date: 09/11/2004    Years since quitting: 19.1   Smokeless tobacco: Never   Tobacco comments:    Pt is vaping x 1.5 years.  Contains nicotine  Vaping Use   Vaping status: Never Used  Substance Use Topics   Alcohol use: Yes    Alcohol/week: 7.0 standard drinks of alcohol    Types: 7 Shots of liquor per week    Comment: 1-2 drinks nightly    Drug use: No   Family History  Problem Relation Age of Onset   Graves' disease Daughter    Breast cancer Maternal Aunt    Heart disease Maternal Aunt    Hypertension Mother    Arthritis Maternal Grandmother    Allergies  Allergen Reactions   Sulfonamide Derivatives Hives   Erythromycin  Nausea And Vomiting   Amoxicillin  Diarrhea, Nausea And Vomiting and Other (See Comments)    Also, it does not work   Doxycycline Nausea Only    Review of Systems  Respiratory:  Positive for shortness of breath.       Objective:    BP 134/84   Pulse 72   Temp 98.5 F (36.9 C)   Ht 5' 6 (1.676 m)   Wt 202 lb 2 oz (91.7 kg)   SpO2 99%   BMI 32.62 kg/m  BP Readings from Last 3 Encounters:  10/17/23 (!) 163/78  10/17/23 134/84  09/14/23 130/88   Wt Readings from Last 3 Encounters:  10/17/23 202 lb 2 oz (91.7 kg)  09/14/23 202 lb (91.6 kg)  08/17/23 207 lb 8 oz (94.1 kg)    Physical Exam Vitals and nursing  note reviewed.  Constitutional:      Appearance: Normal appearance.     Comments: Patient comes in with her daughter and caretaker,  in wheelchair, wearing oxygen  via [place]  (2L)  HENT:     Head: Normocephalic.     Right Ear: Tympanic membrane, ear canal and external ear normal.     Left Ear: Tympanic membrane, ear canal and external ear normal.     Nose: Congestion present.  Eyes:     Extraocular Movements: Extraocular movements intact.     Conjunctiva/sclera: Conjunctivae normal.     Pupils: Pupils are equal, round, and reactive to light.  Cardiovascular:     Rate and Rhythm: Normal rate and regular rhythm.     Heart sounds: Normal heart sounds.  Pulmonary:     Effort: Pulmonary effort is normal.     Breath sounds: Decreased breath sounds present. No wheezing or rhonchi.  Musculoskeletal:     Right lower leg: No edema.     Left lower leg: No edema.  Neurological:     General: No focal deficit present.     Mental Status: She is alert and oriented to person, place, and time.  Psychiatric:        Mood and Affect: Mood normal.        Behavior: Behavior normal.      Results for orders placed or performed during the hospital encounter of 10/17/23  Resp panel by RT-PCR (RSV, Flu A&B, Covid) Anterior Nasal Swab   Specimen: Anterior Nasal Swab  Result Value Ref Range   SARS Coronavirus 2 by RT PCR NEGATIVE NEGATIVE   Influenza A by PCR NEGATIVE NEGATIVE   Influenza B by PCR NEGATIVE NEGATIVE   Resp Syncytial Virus by PCR NEGATIVE NEGATIVE  CBC with Differential  Result Value Ref Range   WBC 11.6 (H) 4.0 - 10.5 K/uL   RBC 4.62 3.87 - 5.11 MIL/uL   Hemoglobin 15.1 (H) 12.0 - 15.0 g/dL   HCT 54.3 63.9 - 53.9 %   MCV 98.7 80.0 - 100.0 fL   MCH 32.7 26.0 - 34.0 pg   MCHC 33.1 30.0 - 36.0 g/dL   RDW 86.6 88.4 - 84.4 %   Platelets 301 150 - 400 K/uL   nRBC 0.0 0.0 - 0.2 %   Neutrophils Relative % 71 %   Neutro Abs 8.2 (H) 1.7 - 7.7 K/uL   Lymphocytes Relative 17 %   Lymphs Abs  2.0 0.7 - 4.0 K/uL   Monocytes Relative 10 %   Monocytes Absolute 1.2 (H) 0.1 - 1.0 K/uL   Eosinophils Relative 1 %   Eosinophils Absolute 0.1 0.0 - 0.5 K/uL   Basophils Relative 1 %   Basophils Absolute 0.1 0.0 - 0.1 K/uL   Immature Granulocytes 0 %   Abs Immature Granulocytes 0.03 0.00 - 0.07 K/uL  Brain natriuretic peptide  Result Value Ref Range   B Natriuretic Peptide 52.9 0.0 - 100.0 pg/mL  Basic metabolic panel  Result Value Ref Range   Sodium 135 135 - 145 mmol/L   Potassium 3.6 3.5 - 5.1 mmol/L   Chloride 99 98 - 111 mmol/L   CO2 25 22 - 32 mmol/L   Glucose, Bld 93 70 - 99 mg/dL   BUN 17 8 - 23 mg/dL   Creatinine, Ser 8.58 (H) 0.44 - 1.00 mg/dL   Calcium  10.2 8.9 - 10.3 mg/dL   GFR, Estimated 37 (L) >60 mL/min   Anion gap 11 5 - 15  Hepatic function panel  Result Value Ref Range   Total Protein 6.5 6.5 - 8.1 g/dL   Albumin 4.1 3.5 - 5.0 g/dL  AST 29 15 - 41 U/L   ALT 25 0 - 44 U/L   Alkaline Phosphatase 53 38 - 126 U/L   Total Bilirubin 0.5 0.0 - 1.2 mg/dL   Bilirubin, Direct 0.2 0.0 - 0.2 mg/dL   Indirect Bilirubin 0.3 0.3 - 0.9 mg/dL  D-dimer, quantitative  Result Value Ref Range   D-Dimer, Quant 0.48 0.00 - 0.50 ug/mL-FEU  I-Stat CG4 Lactic Acid  Result Value Ref Range   Lactic Acid, Venous 1.7 0.5 - 1.9 mmol/L

## 2023-10-17 NOTE — Discharge Instructions (Addendum)
 Take all medication as prescribed and return here for concerning changes in your condition.  The azithromycin  prescribed earlier, as well as steroids prescribed now should both be taken as directed.  Please use albuterol  every 4 hours for the next 2 days, then as needed.

## 2023-10-17 NOTE — ED Triage Notes (Signed)
 Pt presents with increasing shob x 2 weeks.  Has had to increase her oxygen  usage to constant.  Cancer patient.  Was seen at physician's office this morning.  Reports right scapular pain and trembling  Pt states she normally only has to wear O2 with activity. On 2L now

## 2023-10-17 NOTE — Telephone Encounter (Signed)
 Requested by interface  surescripts. Last OV 09/14/23.  Requested Prescriptions  Pending Prescriptions Disp Refills   traZODone  (DESYREL ) 50 MG tablet 90 tablet 2    Sig: Take 1 tablet (50 mg total) by mouth at bedtime.     Psychiatry: Antidepressants - Serotonin Modulator Failed - 10/17/2023  1:59 PM      Failed - Valid encounter within last 6 months    Recent Outpatient Visits           1 year ago Vertigo   [facility] Pickard, Butler DASEN, MD   1 year ago Leg swelling   [facility] Duanne Butler DASEN, MD   2 years ago Acute renal insufficiency   [facility] Duanne, Butler DASEN, MD   2 years ago Anasarca   [facility] Duanne, Butler DASEN, MD   2 years ago Anasarca   [facility] Pickard, Butler DASEN, MD              Passed - Completed PHQ-2 or PHQ-9 in the last 360 days

## 2023-10-18 ENCOUNTER — Other Ambulatory Visit: Payer: Self-pay | Admitting: Family Medicine

## 2023-10-18 ENCOUNTER — Encounter: Payer: Self-pay | Admitting: Family Medicine

## 2023-10-18 ENCOUNTER — Telehealth: Payer: Self-pay

## 2023-10-18 ENCOUNTER — Other Ambulatory Visit: Payer: Self-pay

## 2023-10-18 ENCOUNTER — Ambulatory Visit: Payer: Self-pay | Admitting: Family Medicine

## 2023-10-18 DIAGNOSIS — J441 Chronic obstructive pulmonary disease with (acute) exacerbation: Secondary | ICD-10-CM

## 2023-10-18 DIAGNOSIS — J209 Acute bronchitis, unspecified: Secondary | ICD-10-CM

## 2023-10-18 MED ORDER — TRAZODONE HCL 50 MG PO TABS: 50.0000 mg | ORAL_TABLET | Freq: Every day | ORAL | 2 refills | Status: DC

## 2023-10-18 NOTE — Telephone Encounter (Signed)
 Copied from CRM [id number]. Topic: Clinical - Red Word Triage >> Oct 18, 2023  1:10 PM Zebedee SAUNDERS wrote: Red Word that prompted transfer to Nurse Triage: Pt called stated can't get medication had to go to ER and if she doesn't get medication she will die. She will not tell me the medication. . Chief Complaint: pt refused to answer Symptoms:  pt refused to answer Frequency:  pt refused to answer Pertinent Negatives: Patient denies  pt refused to answer Disposition: [] ED /[] Urgent Care (no appt availability in office) / [] Appointment(In office/virtual)/ []  [place] [facility]/ [] Home Care/ [] Refused Recommended Disposition /[x] [place] [facility]/ []  Follow-up with PCP Additional Notes: pt refused to be triaged- only wanted to speak to doctor or doctor's nurse      Reason for Disposition  [1] Caller demands to speak with the PCP AND [2] about sick adult (or sick caller)  Answer Assessment - Initial Assessment Questions 1. SITUATION:  Document reason for call.     Pt does not want to speak to anyone else except for her doctor- stated her doctor knows her situation and he is the only one that can help her 2. BACKGROUND: Document any background information (e.g., prior calls, known psychiatric history)     See CRM note from agent 3. ASSESSMENT: Document your nursing assessment.     Pt is very anxious, frustrated and is tired of getting the run-a-round only want the doctor or the doctor's nurse. Pt was rude and began use curse word damn while talking. Nurse told pt would help her connect with office but she must refrain from using such language or nurse could not continue to assist: patient agreed. 4. RESPONSE: Document what your response or recommendation was.     Told patient I would continue to call office to attempt to have someone call her back to assist since she would not allow me to help. Pt replied I need to speak to a nurse: I explained I am a nurse: she stated but you are not the  nurse I need: I need my doctor or my doctor's nurse: they are the only ones that can help me.  Pt would like to be called back at [phone].  Protocols used: Difficult Call-A-AH

## 2023-10-18 NOTE — Transitions of Care (Post Inpatient/ED Visit) (Signed)
 10/18/2023  Name: [name] MRN: [id number] DOB: [birth year]  Today's TOC FU Call Status: Today's TOC FU Call Status:: Successful TOC FU Call Completed TOC FU Call Complete Date: 10/18/23 Patient's Name and Date of Birth confirmed.  Transition Care Management Follow-up Telephone Call Date of Discharge: 10/17/23 Discharge Facility: [facility]) Type of Discharge: Emergency Department Reason for ED Visit: Respiratory Respiratory Diagnosis: COPD Exacerbation How have you been since you were released from the hospital?: Better Any questions or concerns?: No  Items Reviewed: Did you receive and understand the discharge instructions provided?: Yes Medications obtained,verified, and reconciled?: Yes (Medications Reviewed) Any new allergies since your discharge?: No Dietary orders reviewed?: Yes Do you have support at home?: Yes Name of Support/Comfort Primary Source: caregiver  Medications Reviewed Today: Medications Reviewed Today     Reviewed by Emmitt Pan, LPN (Licensed Practical Nurse) on 10/18/23 at 1123  Med List Status: <None>   Medication Order Taking? Sig Documenting Provider Last Dose Status Informant  albuterol  (VENTOLIN  HFA) 108 (90 Base) MCG/ACT inhaler [id number] No Inhale 1 puff into the lungs every 6 (six) hours as needed for wheezing or shortness of breath. Duanne Butler DASEN, MD Taking Active Self  azithromycin  (ZITHROMAX ) 250 MG tablet [id number] No Take 2 tablets on day 1, then 1 tablet daily on days 2 through 5  Patient not taking: Reported on 10/17/2023   Aletha Bene, MD Not Taking Active Self  buPROPion  (WELLBUTRIN  XL) 150 MG 24 hr tablet [id number] No Take 1 tablet (150 mg total) by mouth daily. Duanne Butler DASEN, MD 10/17/2023 Morning Active Self  CALCIUM  PO [id number] No Take 1,200 mg by mouth every other day. [provider] 10/16/2023 Active Self  Cholecalciferol (VITAMIN D3) 1.25 MG (50000 UT) CAPS [id number] No Take 50,000 Units by  mouth every Sunday. [provider] 10/14/2023 Active Self  Cyanocobalamin 1000 MCG CAPS [id number] No Take 1,000 mcg by mouth daily. [provider] Past Week Active Self           Med Note SHERALYN, SANDY B   Tue May 21, 2018  3:02 PM)    FLUoxetine  (PROZAC ) 20 MG tablet [id number] No Take 2 tablets (40 mg total) by mouth daily. Stop duloxetine   Patient taking differently: Take 40 mg by mouth daily.   Duanne Butler DASEN, MD 10/17/2023 Morning Active Self  fluticasone  (FLONASE ) 50 MCG/ACT nasal spray [id number] No Place 2 sprays into both nostrils daily. Duanne Butler DASEN, MD Taking Active Self  Fluticasone -Umeclidin-Vilant (TRELEGY ELLIPTA ) 100-62.5-25 MCG/ACT AEPB [id number] No Inhale 1 puff into the lungs daily. Duanne Butler DASEN, MD 10/17/2023 Morning Active Self  levothyroxine  (SYNTHROID ) 125 MCG tablet [id number] No Take 1 tablet (125 mcg total) by mouth daily.  Patient taking differently: Take 125 mcg by mouth daily before breakfast.   Duanne Butler DASEN, MD 10/17/2023 Morning Active Self  losartan -hydrochlorothiazide  (HYZAAR ) 100-25 MG tablet [id number] No Take 1 tablet by mouth daily. Duanne Butler DASEN, MD 10/17/2023 Morning Active Self  naproxen sodium (ALEVE) 220 MG tablet [id number]  Take 220-440 mg by mouth 2 (two) times daily as needed (for pain- TAKE WITH FOOD). [provider]  Active Self  OXYGEN  [id number] No Inhale 2 L/min into the lungs See admin instructions. Inhale 2 L/min into the lungs at bedtime and during the day as needed for shortness of breath [provider] 10/17/2023 Active Self  predniSONE  (DELTASONE ) 20 MG tablet [id number]  Take 2 tablets (40 mg total) by mouth daily with breakfast. For the  next four days Garrick Charleston, MD  Active   predniSONE  (STERAPRED UNI-PAK 21 TAB) 10 MG (21) TBPK tablet [id number] No Use as directed.  Patient not taking: Reported on 10/17/2023   Aletha Bene, MD Not Taking Active Self  rosuvastatin  (CRESTOR ) 20 MG tablet  [id number] No Take 1 tablet (20 mg total) by mouth daily. Duanne Butler DASEN, MD 10/17/2023 Morning Active Self  traZODone  (DESYREL ) 50 MG tablet [id number] No Take 1 tablet (50 mg total) by mouth at bedtime. Duanne Butler DASEN, MD Past Month Active Self  TYLENOL  500 MG tablet [id number] No Take 500-1,000 mg by mouth every 6 (six) hours as needed (for rib pain). [provider] 10/17/2023 Active Self            Home Care and Equipment/Supplies: Were Home Health Services Ordered?: NA Any new equipment or medical supplies ordered?: NA  Functional Questionnaire: Do you need assistance with bathing/showering or dressing?: Yes Do you need assistance with meal preparation?: Yes Do you need assistance with eating?: No Do you have difficulty maintaining continence: No Do you need assistance with getting out of bed/getting out of a chair/moving?: No Do you have difficulty managing or taking your medications?: Yes  Follow up appointments reviewed: PCP Follow-up appointment confirmed?: Yes Date of PCP follow-up appointment?: 10/26/23 Follow-up Provider: [facility] Follow-up appointment confirmed?: NA Do you need transportation to your follow-up appointment?: No Do you understand care options if your condition(s) worsen?: Yes-patient verbalized understanding    SIGNATURE Julian Lemmings, LPN [facility] Nurse Health Advisor Direct Dial [phone]

## 2023-10-18 NOTE — Telephone Encounter (Signed)
 Call clinic access line x2 with caller waiting: no answer: inform caller would try again later.

## 2023-10-18 NOTE — Telephone Encounter (Unsigned)
 Copied from CRM [id number]. Topic: Clinical - Prescription Issue >> Oct 18, 2023 11:42 AM [name] wrote: Reason for CRM: Donna, [name] stated that she [facility]. She also left messages with the pharmacy on [address]. She would like a call back to get the Azithromycin  (Zithromax ) 250 MG tablet reordered. traZODone  (DESYREL ) 50 MG tablet also needs to be renewed, due to the pharmacy either not accepting it.

## 2023-10-18 NOTE — Telephone Encounter (Signed)
 Called [facility] line: front desk answered call and placed call on hold for 10 minutes.

## 2023-10-18 NOTE — Telephone Encounter (Signed)
 Called back to [facility] stating attempting to give urgent message and Front desk staff (Jennifer)stated busy checking in patients and would have to place call on hold again - therefore hung call .  Called back again Coleen, CMA- and she stated she has already spoken to patient and taken care of situation.

## 2023-10-22 ENCOUNTER — Telehealth: Payer: Self-pay

## 2023-10-22 NOTE — Telephone Encounter (Signed)
 Prescription Request  10/22/2023  LOV: 10/17/23  What is the name of the medication or equipment? buPROPion  (WELLBUTRIN  XL) 150 MG 24 hr tablet [[id number]]   Have you contacted your pharmacy to request a refill? Yes   Which pharmacy would you like this sent to?  [id number], [place] - [address] [place]  [address] Phone: [phone] Fax: [phone]    Patient notified that their request is being sent to the clinical staff for review and that they should receive a response within 2 business days.   Please advise at [facility] [phone]

## 2023-10-26 ENCOUNTER — Ambulatory Visit: Payer: Medicare Other | Admitting: Family Medicine

## 2023-10-26 ENCOUNTER — Encounter: Payer: Self-pay | Admitting: Family Medicine

## 2023-10-26 ENCOUNTER — Ambulatory Visit: Payer: TRICARE For Life (TFL) | Attending: Family Medicine

## 2023-10-26 VITALS — BP 132/78 | HR 82 | Temp 97.6°F | Ht 66.0 in | Wt 201.0 lb

## 2023-10-26 DIAGNOSIS — R55 Syncope and collapse: Secondary | ICD-10-CM | POA: Diagnosis not present

## 2023-10-26 DIAGNOSIS — Z9181 History of falling: Secondary | ICD-10-CM

## 2023-10-26 MED ORDER — TRAZODONE HCL 50 MG PO TABS: 50.0000 mg | ORAL_TABLET | Freq: Every day | ORAL | 2 refills | Status: DC

## 2023-10-26 NOTE — Progress Notes (Signed)
 Subjective:    Patient ID: [name], female    DOB: 05-18-1941, 83 y.o.   MRN: [id number] Patient presents today for follow-up.  She was recently referred to the hospital for shortness of breath.  In the hospital, BNP was normal.  White count was mildly elevated at 11.  Chest x-ray showed signs of emphysema but was otherwise clear.  The remainder of her lab work was unremarkable aside from a mild elevation in her creatinine.  She was given a Z-Pak and prednisone for possible COPD exacerbation and was then discharged home.  This was about 9 days ago.  The patient has completed the Z-Pak and the prednisone.  She continues to complain of feeling shortness of breath.  However she is 99% on 2 L today and is speaking comfortably in complete sentences.  Her caregiver states that she coughs frequently.  However the patient does not cough during our encounter today.  Her lungs are completely clear to auscultation bilaterally.  There is no wheezes or crackles or rhonchi.  There is no pitting edema in her extremity.  Her heart is in normal sinus rhythm.  What concerns me is that the patient has fallen 4 times according to her and has been here patient has no recollection of this.  States she is going to the bathroom.  She does not remember getting to the bathroom.  She will wake up almost instantaneously in the floor of the bathroom with no memory of what happened.  The history is very on some point.  The patient does not remember if she is sitting on the toilet and has a vasovagal episode or if she simply loses her balance and falls walking into the bathroom.  She is convinced that she loses consciousness.  This is only occurred 4 times.  Each instance was associated with her going to the bathroom at night.  Her caregiver states that she falls frequently and other times during the day but never loses consciousness.  The patient denies any seizure activity.  She denies biting her tongue or bowel or bladder  incontinence. Past Medical History:  Diagnosis Date   ADD (attention deficit disorder)    Allergic rhinitis    Anemia    Anxiety    Arrhythmia    Arthritis    "hands, neck, right shoulder" (07/28/2015)   Aseptic necrosis (HCC)    Asthma dxc'd 05/2015   Basal cell cancer    Bleeding stomach ulcer ~ 06/2005   COPD (chronic obstructive pulmonary disease) (HCC)    Depression    Diverticula of colon    Dyspnea    Esophagitis    Gastric ulcer with hemorrhage    GERD (gastroesophageal reflux disease)    Heart murmur    History of blood transfusion 06/2005   "related to pneumonia"   History of kidney stones    Hyperlipidemia    Hypertension    Hypothyroidism    Kidney stones    Lung cancer (HCC)    Non small cell lung cancer, adenocarcinoma with BAC 09/2009, VATS converted to wedge resection of left upper lobe mass   Mitral valve prolapse    "no ORs" (07/28/2015)   Osteopenia    Pneumonia 1970's; 06/2005   Sleep apnea dx'd 06/2015   on oxygen at nite at 2L/[place]    Squamous cell skin cancer    Past Surgical History:  Procedure Laterality Date   ABDOMINAL HYSTERECTOMY  1988   COLONOSCOPY WITH PROPOFOL N/A 06/15/2017  Procedure: COLONOSCOPY WITH PROPOFOL;  Surgeon: Jeani Hawking, MD;  Location: [facility];  Service: Endoscopy;  Laterality: N/A;   HAMMER TOE SURGERY Right ~ 2010   SALPINGOOPHORECTOMY Left 1988   SKIN CANCER EXCISION     "I've had severl cut off RLE; left foreqrm, nose under nose"   THORACOTOMY Left 06/2005   THORACOTOMY/LOBECTOMY  09/2009; 11/2009   left; right   Current Outpatient Medications on File Prior to Visit  Medication Sig Dispense Refill   albuterol (VENTOLIN HFA) 108 (90 Base) MCG/ACT inhaler Inhale 1 puff into the lungs every 6 (six) hours as needed for wheezing or shortness of breath. 8 g 2   buPROPion (WELLBUTRIN XL) 150 MG 24 hr tablet Take 1 tablet (150 mg total) by mouth daily. 90 tablet 2   CALCIUM PO Take 1,200 mg by mouth every other day.      Cholecalciferol (VITAMIN D3) 1.25 MG (50000 UT) CAPS Take 50,000 Units by mouth every Sunday.     Cyanocobalamin 1000 MCG CAPS Take 1,000 mcg by mouth daily.     FLUoxetine (PROZAC) 20 MG tablet Take 2 tablets (40 mg total) by mouth daily. Stop duloxetine (Patient taking differently: Take 40 mg by mouth daily.) 180 tablet 2   fluticasone (FLONASE) 50 MCG/ACT nasal spray Place 2 sprays into both nostrils daily. 48 g 2   Fluticasone-Umeclidin-Vilant (TRELEGY ELLIPTA) 100-62.5-25 MCG/ACT AEPB Inhale 1 puff into the lungs daily. 1 each 5   levothyroxine (SYNTHROID) 125 MCG tablet Take 1 tablet (125 mcg total) by mouth daily. (Patient taking differently: Take 125 mcg by mouth daily before breakfast.) 90 tablet 2   losartan-hydrochlorothiazide (HYZAAR) 100-25 MG tablet Take 1 tablet by mouth daily. 90 tablet 2   naproxen sodium (ALEVE) 220 MG tablet Take 220-440 mg by mouth 2 (two) times daily as needed (for pain- TAKE WITH FOOD).     OXYGEN Inhale 2 L/min into the lungs See admin instructions. Inhale 2 L/min into the lungs at bedtime and during the day as needed for shortness of breath     rosuvastatin (CRESTOR) 20 MG tablet Take 1 tablet (20 mg total) by mouth daily. 90 tablet 2   TYLENOL 500 MG tablet Take 500-1,000 mg by mouth every 6 (six) hours as needed (for rib pain).     No current facility-administered medications on file prior to visit.    Allergies  Allergen Reactions   Sulfonamide Derivatives Hives   Erythromycin Nausea And Vomiting   Amoxicillin Diarrhea, Nausea And Vomiting and Other (See Comments)    Also, it does not work   Doxycycline Nausea Only   Social History   Socioeconomic History   Marital status: Married    Spouse name: Not on file   Number of children: 3   Years of education: Not on file   Highest education level: Not on file  Occupational History   Occupation: retired Academic librarian: RETIRED  Tobacco Use   Smoking status: Former    Current packs/day:  0.00    Average packs/day: 0.8 packs/day for 30.0 years (22.5 ttl pk-yrs)    Types: Cigarettes    Start date: 09/11/1974    Quit date: 09/11/2004    Years since quitting: 19.1   Smokeless tobacco: Never   Tobacco comments:    Pt is vaping x 1.5 years.  Contains nicotine  Vaping Use   Vaping status: Never Used  Substance and Sexual Activity   Alcohol use: Yes    Alcohol/week: 7.0  standard drinks of alcohol    Types: 7 Shots of liquor per week    Comment: 1-2 drinks nightly    Drug use: No   Sexual activity: Yes  Other Topics Concern   Not on file  Social History Narrative   Not on file   Social Drivers of Health   Financial Resource Strain: Low Risk  (08/30/2023)   Overall Financial Resource Strain (CARDIA)    Difficulty of Paying Living Expenses: Not hard at all  Food Insecurity: No Food Insecurity (08/30/2023)   Hunger Vital Sign    Worried About Running Out of Food in the Last Year: Never true    Ran Out of Food in the Last Year: Never true  Transportation Needs: No Transportation Needs (08/30/2023)   PRAPARE - Administrator, Civil Service (Medical): No    Lack of Transportation (Non-Medical): No  Physical Activity: Inactive (08/30/2023)   Exercise Vital Sign    Days of Exercise per Week: 0 days    Minutes of Exercise per Session: 0 min  Stress: No Stress Concern Present (08/30/2023)   [employer] of Occupational Health - Occupational Stress Questionnaire    Feeling of Stress : Only a little  Social Connections: Socially Isolated (08/30/2023)   Social Connection and Isolation Panel [NHANES]    Frequency of Communication with Friends and Family: More than three times a week    Frequency of Social Gatherings with Friends and Family: Once a week    Attends Religious Services: Never    Database administrator or Organizations: No    Attends Banker Meetings: Never    Marital Status: Widowed  Intimate Partner Violence: Not At Risk (08/30/2023)    Humiliation, Afraid, Rape, and Kick questionnaire    Fear of Current or Ex-Partner: No    Emotionally Abused: No    Physically Abused: No    Sexually Abused: No      Review of Systems  All other systems reviewed and are negative.      Objective:   Physical Exam Vitals reviewed.  Constitutional:      General: She is not in acute distress.    Appearance: Normal appearance. She is normal weight. She is not ill-appearing or toxic-appearing.  Cardiovascular:     Rate and Rhythm: Normal rate and regular rhythm.     Heart sounds: Normal heart sounds.  Pulmonary:     Effort: Pulmonary effort is normal. No respiratory distress.     Breath sounds: Normal breath sounds. No stridor. No wheezing, rhonchi or rales.  Chest:     Chest wall: No tenderness.  Abdominal:     General: Bowel sounds are normal.     Palpations: Abdomen is soft.  Musculoskeletal:     Right lower leg: No edema.     Left lower leg: No edema.  Neurological:     Mental Status: She is alert.           Assessment & Plan:  Syncope, unspecified syncope type - Plan: LONG TERM MONITOR (3-14 DAYS) The history is very uncertain.  The caregiver adds additional elements to the story that the patient disagrees with.  However the patient does report that she loses consciousness and wakes up in the floor while going to the bathroom.  Therefore I recommended starting with a cardiac monitor for 14 days to evaluate for any significant cardiac arrhythmia or life-threatening cardiac arrhythmia as this would be the most concerning possible cause of syncope.  I suspect that the patient is either falling going to the bathroom and not truly losing consciousness or potentially having a vasovagal episode while urinating.  I believe it is highly unlikely that she has had 4 syncopal episodes only going to the bathroom if this were some type of underlying cardiac arrhythmia such as ventricular tachycardia or bradycardia.  I feel that these  would occur in other times.  I will also schedule the patient for an echocardiogram given her dyspnea along with the syncope.  We discussed whether the patient should be living independently.  I suggested that she should consider moving to a nursing facility frequency of the falls and a question of syncope.  Patient declines this and is able to make informed decisions.  However I strongly recommended the patient purchase a fall pendant to alert caregivers if the patient falls

## 2023-10-29 NOTE — Progress Notes (Unsigned)
 EP to read.

## 2023-10-30 ENCOUNTER — Ambulatory Visit: Payer: TRICARE For Life (TFL) | Admitting: Internal Medicine

## 2023-10-30 ENCOUNTER — Telehealth (HOSPITAL_COMMUNITY): Payer: Self-pay | Admitting: Family Medicine

## 2023-10-30 ENCOUNTER — Ambulatory Visit (HOSPITAL_COMMUNITY)
Admission: RE | Admit: 2023-10-30 | Discharge: 2023-10-30 | Disposition: A | Discharge status: Home / Self Care | Payer: Medicare Other | Source: Ambulatory Visit | Attending: Internal Medicine | Admitting: Internal Medicine

## 2023-10-30 ENCOUNTER — Inpatient Hospital Stay: Payer: Self-pay | Attending: Internal Medicine

## 2023-10-30 DIAGNOSIS — F419 Anxiety disorder, unspecified: Secondary | ICD-10-CM | POA: Diagnosis not present

## 2023-10-30 DIAGNOSIS — R0602 Shortness of breath: Secondary | ICD-10-CM | POA: Diagnosis not present

## 2023-10-30 DIAGNOSIS — I10 Essential (primary) hypertension: Secondary | ICD-10-CM | POA: Diagnosis not present

## 2023-10-30 DIAGNOSIS — Z90721 Acquired absence of ovaries, unilateral: Secondary | ICD-10-CM | POA: Diagnosis not present

## 2023-10-30 DIAGNOSIS — Z882 Allergy status to sulfonamides status: Secondary | ICD-10-CM | POA: Insufficient documentation

## 2023-10-30 DIAGNOSIS — Z923 Personal history of irradiation: Secondary | ICD-10-CM | POA: Diagnosis not present

## 2023-10-30 DIAGNOSIS — Z8551 Personal history of malignant neoplasm of bladder: Secondary | ICD-10-CM | POA: Diagnosis not present

## 2023-10-30 DIAGNOSIS — Z881 Allergy status to other antibiotic agents status: Secondary | ICD-10-CM | POA: Diagnosis not present

## 2023-10-30 DIAGNOSIS — J439 Emphysema, unspecified: Secondary | ICD-10-CM | POA: Insufficient documentation

## 2023-10-30 DIAGNOSIS — G4730 Sleep apnea, unspecified: Secondary | ICD-10-CM | POA: Diagnosis not present

## 2023-10-30 DIAGNOSIS — C3490 Malignant neoplasm of unspecified part of unspecified bronchus or lung: Secondary | ICD-10-CM

## 2023-10-30 DIAGNOSIS — Z87442 Personal history of urinary calculi: Secondary | ICD-10-CM | POA: Insufficient documentation

## 2023-10-30 DIAGNOSIS — C3431 Malignant neoplasm of lower lobe, right bronchus or lung: Secondary | ICD-10-CM | POA: Diagnosis not present

## 2023-10-30 DIAGNOSIS — F32A Depression, unspecified: Secondary | ICD-10-CM | POA: Insufficient documentation

## 2023-10-30 DIAGNOSIS — C3412 Malignant neoplasm of upper lobe, left bronchus or lung: Secondary | ICD-10-CM | POA: Insufficient documentation

## 2023-10-30 DIAGNOSIS — R413 Other amnesia: Secondary | ICD-10-CM | POA: Diagnosis not present

## 2023-10-30 DIAGNOSIS — Z79890 Hormone replacement therapy (postmenopausal): Secondary | ICD-10-CM | POA: Insufficient documentation

## 2023-10-30 DIAGNOSIS — R55 Syncope and collapse: Secondary | ICD-10-CM | POA: Diagnosis not present

## 2023-10-30 DIAGNOSIS — Z90710 Acquired absence of both cervix and uterus: Secondary | ICD-10-CM | POA: Insufficient documentation

## 2023-10-30 DIAGNOSIS — E039 Hypothyroidism, unspecified: Secondary | ICD-10-CM | POA: Insufficient documentation

## 2023-10-30 DIAGNOSIS — I700 Atherosclerosis of aorta: Secondary | ICD-10-CM | POA: Diagnosis not present

## 2023-10-30 DIAGNOSIS — Z79899 Other long term (current) drug therapy: Secondary | ICD-10-CM | POA: Diagnosis not present

## 2023-10-30 DIAGNOSIS — C3411 Malignant neoplasm of upper lobe, right bronchus or lung: Secondary | ICD-10-CM | POA: Diagnosis not present

## 2023-10-30 DIAGNOSIS — Z880 Allergy status to penicillin: Secondary | ICD-10-CM | POA: Insufficient documentation

## 2023-10-30 LAB — CBC WITH DIFFERENTIAL (CANCER CENTER ONLY)
Abs Immature Granulocytes: 0.07 10*3/uL (ref 0.00–0.07)
Basophils Absolute: 0 10*3/uL (ref 0.0–0.1)
Basophils Relative: 0 %
Eosinophils Absolute: 0.2 10*3/uL (ref 0.0–0.5)
Eosinophils Relative: 2 %
HCT: 41.5 % (ref 36.0–46.0)
Hemoglobin: 13.7 g/dL (ref 12.0–15.0)
Immature Granulocytes: 1 %
Lymphocytes Relative: 18 %
Lymphs Abs: 2.1 10*3/uL (ref 0.7–4.0)
MCH: 33.1 pg (ref 26.0–34.0)
MCHC: 33 g/dL (ref 30.0–36.0)
MCV: 100.2 fL — ABNORMAL HIGH (ref 80.0–100.0)
Monocytes Absolute: 1.1 10*3/uL — ABNORMAL HIGH (ref 0.1–1.0)
Monocytes Relative: 9 %
Neutro Abs: 8.3 10*3/uL — ABNORMAL HIGH (ref 1.7–7.7)
Neutrophils Relative %: 70 %
Platelet Count: 251 10*3/uL (ref 150–400)
RBC: 4.14 MIL/uL (ref 3.87–5.11)
RDW: 13.2 % (ref 11.5–15.5)
WBC Count: 11.9 10*3/uL — ABNORMAL HIGH (ref 4.0–10.5)
nRBC: 0 % (ref 0.0–0.2)

## 2023-10-30 LAB — CMP (CANCER CENTER ONLY)
ALT: 22 U/L (ref 0–44)
AST: 18 U/L (ref 15–41)
Albumin: 4 g/dL (ref 3.5–5.0)
Alkaline Phosphatase: 67 U/L (ref 38–126)
Anion gap: 7 (ref 5–15)
BUN: 15 mg/dL (ref 8–23)
CO2: 29 mmol/L (ref 22–32)
Calcium: 9.7 mg/dL (ref 8.9–10.3)
Chloride: 102 mmol/L (ref 98–111)
Creatinine: 0.87 mg/dL (ref 0.44–1.00)
GFR, Estimated: >60 mL/min (ref >60)
Glucose, Bld: 84 mg/dL (ref 70–99)
Potassium: 4.3 mmol/L (ref 3.5–5.1)
Sodium: 138 mmol/L (ref 135–145)
Total Bilirubin: 0.4 mg/dL (ref 0.0–1.2)
Total Protein: 6.3 g/dL — ABNORMAL LOW (ref 6.5–8.1)

## 2023-10-30 MED ORDER — IOHEXOL 300 MG/ML  SOLN
60.0000 mL | Freq: Once | INTRAMUSCULAR | Status: AC | PRN
  Administered 2023-10-30: 60 mL via INTRAVENOUS

## 2023-10-30 NOTE — Telephone Encounter (Signed)
 I called to schedule patients echocardiogram that you ordered and daughter does not wish to schedule at this time. Will wait on results of CT and labs. Order will be removed from the echo WQ and if patient calls back to reschedule if needed. Thank you

## 2023-10-30 NOTE — Progress Notes (Deleted)
 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 She has seen Dr. Delton Coombes in the past- last in 2012. She has been seeing Dr. Katrine Coho at [facility] and is here today to re-est care. She c/o DOE with cleaning her house or taking out the trash. She is on O2 with sleep at 2lpm. She uses proiar 1 x per wk on average and has a neb with albuterol that she rarely uses.   doe x [facility] = can't walk a nl pace on a flat grade s sob but does fine slow and flat eg shopping at slow pace / can't do ramps Doe Never changed since empyema despite having RT and chemo for BAC dx 09/14/09 at [facility] with [facility] 11/2009 and persistent bilateral MPN's under surveillance at [facility] but no recent bx's or further chemo.  Uses  advair 250 twice daily ? Benefit > saba doesn't really help any of her symptoms/ no signifcant chronic or assoc  Coughing. rec Plan A = Automatic = bevespi Take 2 puffs first thing in am and then another 2 puffs about 12 hours later.  Work on inhaler technique: Plan B = Backup Only use your albuterol as a rescue medication  Plan C = Crisis - only use your albuterol nebulizer if you first try Plan B and it fails to help > ok to use the nebulizer up to every 4 hours but if start needing it regularly call for immediate appointment   07/06/2020  f/u ov/[name] re: GOLD II / 02 2lpm hs and trelegy / not monitoring sats as rec Chief Complaint  Patient  presents with   Follow-up    1 year rov copd.  needs O2 requalification- wears qhs.    Dyspnea:  mb and back and uses saba freq  Cough: none  Sleeping: bed is flat / 2 pillows/ SABA use: twice daily usually after over does it (opposite the instructions given) 02: 2lpm hs  - not checking sats with ex  Rec Continue Trelegy each am and 0xygen 2lpm at bedtime but you do not need a portable system at this point Only use your albuterol as a rescue medication Try albuterol 15 min before an activity (on alternate days)  that you know would make you short of breat    07/28/2022  f/u ov/[name] re: GOLD 2 copd/ 02 3lpm hs/ POC during the day maint on trelegy   Chief Complaint  Patient presents with   Follow-up    Dyspnea and SOB worse.  Oxygen qhs and prn during day.   Dyspnea:  room to room no longer checking sats  Cough: slt greenish/ assoc watery rhinitis  Sleeping: flat bed 2 pillows no resp cc  SABA use: twice a week  02: not needing sitting/ 3lpm walking  Rec Make sure you check your oxygen saturation  AT  your highest level of activity (not after you stop)   to be sure it stays over 90%   Also  Ok to try albuterol 15 min before an activity (on alternating days)  that you know would usually make you short of breath   10/30/2023  f/u ov/[name] re: ***   maint on ***  No chief complaint on file.   Dyspnea:  *** Cough: *** Sleeping: *** resp cc  SABA use: *** 02: ***  Lung cancer screening :  ***    No obvious day to day or daytime variability or assoc excess/ purulent sputum or mucus plugs or hemoptysis or cp or chest tightness, subjective wheeze or overt sinus or hb symptoms.    Also denies any obvious fluctuation of symptoms with weather or environmental changes or other aggravating or alleviating factors except as outlined above   No unusual exposure hx or h/o childhood pna/ asthma or knowledge of premature birth.  Current Allergies, Complete Past Medical History, Past Surgical  History, Family History, and Social History were reviewed in [employer] record.  ROS  The following are not active complaints unless bolded Hoarseness, sore throat, dysphagia, dental problems, itching, sneezing,  nasal congestion or discharge of excess mucus or purulent secretions, ear ache,   fever, chills, sweats, unintended wt loss or wt gain, classically pleuritic or exertional cp,  orthopnea pnd or arm/hand swelling  or leg swelling, presyncope, palpitations, abdominal pain, anorexia, nausea, vomiting, diarrhea  or change in bowel habits or change in bladder habits, change in stools or change in urine, dysuria, hematuria,  rash, arthralgias, visual complaints, headache, numbness, weakness or ataxia or problems with walking or coordination,  change in mood or  memory.        No outpatient medications have been marked as taking for the 10/30/23 encounter (Appointment) with Nyoka Cowden, MD.                                   Objective:   Physical Exam   10/30/2023         ***  07/28/2022        196  07/06/2020     183  04/09/2019       173  03/10/2019       179 02/05/2019       179  07/19/2018       173  06/19/2018       175  08/10/2017     170   06/29/17 166 lb (75.3 kg)  05/28/17 164 lb (74.4 kg)  05/03/17 166 lb (75.3 kg)    Vital signs reviewed  10/30/2023  - Note at rest 02 sats  ***% on ***   General appearance:    ***  Mild barr***       Assessment:

## 2023-11-04 DIAGNOSIS — R55 Syncope and collapse: Secondary | ICD-10-CM

## 2023-11-05 DIAGNOSIS — D225 Melanocytic nevi of trunk: Secondary | ICD-10-CM | POA: Diagnosis not present

## 2023-11-05 DIAGNOSIS — Z85828 Personal history of other malignant neoplasm of skin: Secondary | ICD-10-CM | POA: Diagnosis not present

## 2023-11-05 DIAGNOSIS — Z08 Encounter for follow-up examination after completed treatment for malignant neoplasm: Secondary | ICD-10-CM | POA: Diagnosis not present

## 2023-11-05 DIAGNOSIS — L821 Other seborrheic keratosis: Secondary | ICD-10-CM | POA: Diagnosis not present

## 2023-11-05 DIAGNOSIS — L814 Other melanin hyperpigmentation: Secondary | ICD-10-CM | POA: Diagnosis not present

## 2023-11-06 ENCOUNTER — Other Ambulatory Visit: Payer: Medicare Other

## 2023-11-08 ENCOUNTER — Inpatient Hospital Stay (HOSPITAL_BASED_OUTPATIENT_CLINIC_OR_DEPARTMENT_OTHER): Payer: Medicare Other | Admitting: Internal Medicine

## 2023-11-08 VITALS — BP 140/61 | HR 70 | Temp 97.2°F | Resp 18 | Ht 66.0 in | Wt 202.0 lb

## 2023-11-08 DIAGNOSIS — R55 Syncope and collapse: Secondary | ICD-10-CM | POA: Diagnosis not present

## 2023-11-08 DIAGNOSIS — C3490 Malignant neoplasm of unspecified part of unspecified bronchus or lung: Secondary | ICD-10-CM | POA: Diagnosis not present

## 2023-11-08 DIAGNOSIS — R0602 Shortness of breath: Secondary | ICD-10-CM | POA: Diagnosis not present

## 2023-11-08 DIAGNOSIS — C3412 Malignant neoplasm of upper lobe, left bronchus or lung: Secondary | ICD-10-CM | POA: Diagnosis not present

## 2023-11-08 DIAGNOSIS — C3411 Malignant neoplasm of upper lobe, right bronchus or lung: Secondary | ICD-10-CM | POA: Diagnosis not present

## 2023-11-08 DIAGNOSIS — R413 Other amnesia: Secondary | ICD-10-CM | POA: Diagnosis not present

## 2023-11-08 DIAGNOSIS — I700 Atherosclerosis of aorta: Secondary | ICD-10-CM | POA: Diagnosis not present

## 2023-11-08 NOTE — Progress Notes (Signed)
 [facility] [phone]   [phone]  OFFICE PROGRESS NOTE  Donita Brooks, MD [address] [place] [address]  DIAGNOSIS: history of a bilateral stage IA non-small cell lung cancer involving the left upper lobe as well as right upper lobe  PRIOR THERAPY: 1)  status post wedge resection of the 2 lesions in 2011 under the care of Dr. Edwyna Shell.  2) systemic chemotherapy with carboplatin, Alimta and Avastin for 6 cycles at [facility] completed in 2011. 3) stereotactic radiotherapy to the enlarging left upper lobe lung nodule in 2014.  CURRENT THERAPY: Observation.  INTERVAL HISTORY: [name] 83 y.o. female return to the clinic today for annual follow-up visit accompanied by her caregiver.Discussed the use of AI scribe software for clinical note transcription with the patient, who gave verbal consent to proceed.  History of Present Illness   [name] is an 83 year old female with a history of bladder cancer who presents for routine follow-up. She is accompanied by her caregiver.  She has a history of bilateral NSCLC, initially diagnosed as stage 1A, for which she underwent surgical resection of nodules in 2011. She subsequently received chemotherapy at [facility], which included carboplatin, alimta, and avastin. In 2014, a nodule was detected in her left upper lobe , for which she received radiation therapy. Since then, she has been under surveillance with annual scans, which have shown no evidence of cancer recurrence.  She experiences chronic shortness of breath and is currently on oxygen therapy. No new chest pain or hemoptysis. She had a recent episode requiring an emergency room visit due to difficulty breathing, during which she was diagnosed with bronchitis. Her recent scan did not show any signs of pneumonia.  She has experienced episodes of near syncope, occurring approximately three  times, where she 'sort of passed out' and experienced memory loss upon waking at night. She currently has a heart monitor placed to assess for potential cardiac causes.  She notes weight loss, which she considers positive as she felt she was 'too big.' She occasionally experiences rib pain, likely related to previous surgeries on the right side. No nausea, vomiting, or diarrhea.       MEDICAL HISTORY: Past Medical History:  Diagnosis Date   ADD (attention deficit disorder)    Allergic rhinitis    Anemia    Anxiety    Arrhythmia    Arthritis    "hands, neck, right shoulder" (07/28/2015)   Aseptic necrosis (HCC)    Asthma dxc'd 05/2015   Basal cell cancer    Bleeding stomach ulcer ~ 06/2005   COPD (chronic obstructive pulmonary disease) (HCC)    Depression    Diverticula of colon    Dyspnea    Esophagitis    Gastric ulcer with hemorrhage    GERD (gastroesophageal reflux disease)    Heart murmur    History of blood transfusion 06/2005   "related to pneumonia"   History of kidney stones    Hyperlipidemia    Hypertension    Hypothyroidism    Kidney stones    Lung cancer (HCC)    Non small cell lung cancer, adenocarcinoma with BAC 09/2009, VATS converted to wedge resection of left upper lobe mass   Mitral valve prolapse    "no ORs" (07/28/2015)   Osteopenia    Pneumonia 1970's; 06/2005   Sleep apnea dx'd 06/2015   on oxygen at nite at [facility]  Squamous cell skin cancer     ALLERGIES:  is allergic to sulfonamide derivatives, erythromycin, amoxicillin, and doxycycline.  MEDICATIONS:  Current Outpatient Medications  Medication Sig Dispense Refill   albuterol (VENTOLIN HFA) 108 (90 Base) MCG/ACT inhaler Inhale 1 puff into the lungs every 6 (six) hours as needed for wheezing or shortness of breath. 8 g 2   buPROPion (WELLBUTRIN XL) 150 MG 24 hr tablet Take 1 tablet (150 mg total) by mouth daily. 90 tablet 2   CALCIUM PO Take 1,200 mg by mouth every other day.      Cholecalciferol (VITAMIN D3) 1.25 MG (50000 UT) CAPS Take 50,000 Units by mouth every Sunday.     Cyanocobalamin 1000 MCG CAPS Take 1,000 mcg by mouth daily.     FLUoxetine (PROZAC) 20 MG tablet Take 2 tablets (40 mg total) by mouth daily. Stop duloxetine (Patient taking differently: Take 40 mg by mouth daily.) 180 tablet 2   fluticasone (FLONASE) 50 MCG/ACT nasal spray Place 2 sprays into both nostrils daily. 48 g 2   Fluticasone-Umeclidin-Vilant (TRELEGY ELLIPTA) 100-62.5-25 MCG/ACT AEPB Inhale 1 puff into the lungs daily. 1 each 5   levothyroxine (SYNTHROID) 125 MCG tablet Take 1 tablet (125 mcg total) by mouth daily. (Patient taking differently: Take 125 mcg by mouth daily before breakfast.) 90 tablet 2   losartan-hydrochlorothiazide (HYZAAR) 100-25 MG tablet Take 1 tablet by mouth daily. 90 tablet 2   naproxen sodium (ALEVE) 220 MG tablet Take 220-440 mg by mouth 2 (two) times daily as needed (for pain- TAKE WITH FOOD).     OXYGEN Inhale 2 L/min into the lungs See admin instructions. Inhale 2 L/min into the lungs at bedtime and during the day as needed for shortness of breath     rosuvastatin (CRESTOR) 20 MG tablet Take 1 tablet (20 mg total) by mouth daily. 90 tablet 2   traZODone (DESYREL) 50 MG tablet Take 1 tablet (50 mg total) by mouth at bedtime. 90 tablet 2   TYLENOL 500 MG tablet Take 500-1,000 mg by mouth every 6 (six) hours as needed (for rib pain).     No current facility-administered medications for this visit.    SURGICAL HISTORY:  Past Surgical History:  Procedure Laterality Date   ABDOMINAL HYSTERECTOMY  1988   COLONOSCOPY WITH PROPOFOL N/A 06/15/2017   Procedure: COLONOSCOPY WITH PROPOFOL;  Surgeon: Jeani Hawking, MD;  Location: [facility];  Service: Endoscopy;  Laterality: N/A;   HAMMER TOE SURGERY Right ~ 2010   SALPINGOOPHORECTOMY Left 1988   SKIN CANCER EXCISION     "I've had severl cut off RLE; left foreqrm, nose under nose"   THORACOTOMY Left 06/2005    THORACOTOMY/LOBECTOMY  09/2009; 11/2009   left; right    REVIEW OF SYSTEMS:  A comprehensive review of systems was negative except for: Respiratory: positive for dyspnea on exertion   PHYSICAL EXAMINATION: General appearance: alert, cooperative, and no distress Head: Normocephalic, without obvious abnormality, atraumatic Neck: no adenopathy, no JVD, supple, symmetrical, trachea midline, and thyroid not enlarged, symmetric, no tenderness/mass/nodules Lymph nodes: Cervical, supraclavicular, and axillary nodes normal. Resp: clear to auscultation bilaterally Back: symmetric, no curvature. ROM normal. No CVA tenderness. Cardio: regular rate and rhythm, S1, S2 normal, no murmur, click, rub or gallop GI: soft, non-tender; bowel sounds normal; no masses,  no organomegaly Extremities: extremities normal, atraumatic, no cyanosis or edema  ECOG PERFORMANCE STATUS: 1 - Symptomatic but completely ambulatory  Blood pressure (!) 140/61, pulse 70, temperature (!) 97.2 F (36.2 C),  temperature source Temporal, resp. rate 18, height 5\' 6"  (1.676 m), weight 202 lb (91.6 kg), SpO2 100%.  LABORATORY DATA: Lab Results  Component Value Date   WBC 11.9 (H) 10/30/2023   HGB 13.7 10/30/2023   HCT 41.5 10/30/2023   MCV 100.2 (H) 10/30/2023   PLT 251 10/30/2023      Chemistry      Component Value Date/Time   NA 138 10/30/2023 1337   K 4.3 10/30/2023 1337   CL 102 10/30/2023 1337   CO2 29 10/30/2023 1337   BUN 15 10/30/2023 1337   CREATININE 0.87 10/30/2023 1337   CREATININE 0.85 08/17/2023 1225      Component Value Date/Time   CALCIUM 9.7 10/30/2023 1337   ALKPHOS 67 10/30/2023 1337   AST 18 10/30/2023 1337   ALT 22 10/30/2023 1337   BILITOT 0.4 10/30/2023 1337       RADIOGRAPHIC STUDIES: CT Chest W Contrast Result Date: 11/08/2023 CLINICAL DATA:  Non-small lung cancer restaging exam. EXAM: CT CHEST WITH CONTRAST TECHNIQUE: Multidetector CT imaging of the chest was performed during  intravenous contrast administration. RADIATION DOSE REDUCTION: This exam was performed according to the departmental dose-optimization program which includes automated exposure control, adjustment of the mA and/or kV according to patient size and/or use of iterative reconstruction technique. CONTRAST:  60mL OMNIPAQUE IOHEXOL 300 MG/ML  SOLN COMPARISON:  None Available. FINDINGS: Cardiovascular: No significant vascular findings. Normal heart size. Lipomatous hypertrophy of the interatrial septum (benign finding). No pericardial effusion. Mediastinum/Nodes: No axillary or supraclavicular adenopathy. No mediastinal or hilar adenopathy. No pericardial fluid. Esophagus normal. Lungs/Pleura: Masslike consolidation in the periphery of the LEFT upper lobe at treatment site measures 33 mm 26 mm compared to 27 mm by 35 mm. Visually mass appears very similar. Small rounded nodule superior to the dominant mass in the LEFT upper lobe measures 6 mm (image 33/2 compared to 6 mm for no change. No new nodules. Within the RIGHT lung solid RIGHT lower lobe nodule measures 6 mm (image 74/2 unchanged. Ground-glass nodule in superior segment of the RIGHT lower lobe (image 26/2) is also unchanged. Solid spiculated nodule in the RIGHT lower lobe measuring 7 mm (image 58) is unchanged. No new pulmonary nodules evident. Upper Abdomen: Limited view of the liver, kidneys, pancreas are unremarkable. Normal adrenal glands. Musculoskeletal: No aggressive osseous lesion. Endplate deformity of the T12 vertebral body is less conspicuous than prior. IMPRESSION: 1. Stable treatment site in the LEFT upper lobe. 2. Stable small nodule superior to the dominant mass in the LEFT upper lobe. 3. Stable solid and ground-glass nodules in the RIGHT lower lobe. 4. No new pulmonary nodules. 5. No thoracic adenopathy. 6. No evidence skeletal metastasis Electronically Signed   By: Genevive Bi M.D.   On: 11/08/2023 09:30   DG Chest 2 View Result Date:  10/17/2023 CLINICAL DATA:  Shortness of breath. EXAM: CHEST - 2 VIEW COMPARISON:  Chest radiograph dated 07/06/2022. FINDINGS: Background of emphysema and left mid lung field scarring. No new consolidation. There is no pleural effusion pneumothorax. Stable cardiac silhouette. Atherosclerotic calcification of the aorta. No acute osseous pathology. IMPRESSION: 1. No active cardiopulmonary disease. 2. Emphysema and left mid lung field scarring. Electronically Signed   By: Elgie Collard M.D.   On: 10/17/2023 17:42     ASSESSMENT AND PLAN: This is a very pleasant 83 years old white female with history of bilateral stage Ia non-small cell lung cancer involving the left upper lobe as well as the right upper  lobe status post wedge resection of the 2 lesions in 2011 under the care of Dr. Edwyna Shell.  The patient also received adjuvant systemic chemotherapy for 6 cycles at [facility] with carboplatin, Alimta and Avastin.  She also received stereotactic radiotherapy to the enlarging left upper lobe lung nodule in 2014.  The patient is feeling fine today with no concerning complaints except for the baseline shortness of breath secondary to COPD and she is currently on home oxygen.  History of a bilateral stage IA non-small cell lung cancer involving the left upper lobe as well as right upper lobe  Diagnosed in 2011, treated with surgery and chemotherapy (carboplatin, Alimta, Avastin). Radiation therapy in 2014 for left upper lobe nodule. No recurrence for over ten years. Recent scan shows no signs of cancer growth or metastasis. Patient prefers annual follow-ups. - Schedule follow-up scan in one year  Chronic Dyspnea Managed with supplemental oxygen. No new symptoms such as chest pain, hemoptysis, or significant weight loss. Recent scan shows no pneumonia or other acute issues. - Continue current oxygen therapy  Syncope Three episodes associated with nocturnal confusion. Currently monitored with a  heart monitor. - Continue heart monitoring  General Health Maintenance Patient reports beneficial weight loss. No new symptoms such as nausea, vomiting, or diarrhea. No current bronchitis infection. - Provide copy of recent scan results - Arrange follow-up in one year.   She was advised to call immediately if she has any concerning symptoms in the interval. The patient voices understanding of current disease status and treatment options and is in agreement with the current care plan.  All questions were answered. The patient knows to call the clinic with any problems, questions or concerns. We can certainly see the patient much sooner if necessary.   Disclaimer: This note was dictated with voice recognition software. Similar sounding words can inadvertently be transcribed and may not be corrected upon review.

## 2023-11-23 ENCOUNTER — Ambulatory Visit: Payer: TRICARE For Life (TFL) | Admitting: Internal Medicine

## 2023-11-23 ENCOUNTER — Encounter: Payer: Self-pay | Admitting: Internal Medicine

## 2023-11-23 VITALS — BP 108/66 | HR 92 | Ht 66.0 in | Wt 201.2 lb

## 2023-11-23 DIAGNOSIS — J449 Chronic obstructive pulmonary disease, unspecified: Secondary | ICD-10-CM

## 2023-11-23 DIAGNOSIS — Z87891 Personal history of nicotine dependence: Secondary | ICD-10-CM

## 2023-11-23 DIAGNOSIS — J9611 Chronic respiratory failure with hypoxia: Secondary | ICD-10-CM

## 2023-11-23 NOTE — Assessment & Plan Note (Signed)
 See care everywhere pulmonary notes from 2016/ [facility] > rx 2lpm hs  - 06/19/2018  Walked RA x 3 laps @ 185 ft each stopped due to  End of study, moderately fast pace, no desat but sob at end    - 04/09/2019   [facility] RA  2 laps @  approx 279ft each @ moderate pace  stopped due to  Sob at end but sats still 91%  -  07/06/2020   Walked RA  approx   750 ft  @ moderate pace /awkward gait due to back pain  stopped due to  Sob with sats still 90%     - Resting RA sats 11/23/2023 = 93%   Advised: Make sure you check your oxygen saturation  AT  your highest level of activity (not after you stop)   to be sure it stays over 90% and adjust  02 flow upward to maintain this level if needed but remember to turn it back to previous settings when you stop (to conserve your supply).   F/u yearly sooner prn          Each maintenance medication was reviewed in detail including emphasizing most importantly the difference between maintenance and prns and under what circumstances the prns are to be triggered using an action plan format where appropriate.  Total time for H and P, chart review, counseling, reviewing dpi/ 02/pulse ox  device(s) and generating customized AVS unique to this office visit / same day charting = 35 min

## 2023-11-23 NOTE — Assessment & Plan Note (Signed)
 Quit smoking 2006 PFT's  08/07/11  FEV1 2.14 (89 % ) ratio 69  with DLCO  73 % corrects to 108 % for alv volume  - Spirometry 06/29/2017  FEV1 1.24 (52%)  Ratio 64 on advair -  Allergy profile 06/29/17  >  Eos 0.2/  IgE  13 RAST neg  - 06/29/2017  try bevespi  2bid > improved as of 08/10/18  -  Spirometry 06/19/2018  FEV1 1.4 (57%)  Ratio 64 p bevespi x 2 pffs   - PFT's  07/19/2018  FEV1 1.63  (74 % ) ratio 68  p 13 % improvement from saba p nothing prior to study with DLCO  71/70c % corrects to 93 % for alv volume    - 02/05/2019   continue bevespi  - 03/2019 changed to trelegy due to insurance    Group D (now reclassified as E) in terms of symptom/risk and laba/lama/ICS  therefore appropriate rx at this point >>>  continue trelegy 100 q am

## 2023-11-23 NOTE — Progress Notes (Addendum)
 Subjective:    Patient ID: [name], female   DOB: 1941/04/22   MRN: [id number]    Brief patient profile:  34 yowf outpt director at [employer] who quit smoking 2006 with L empyema and never completely  recovered activity tol eval by [facility] and [facility] with polycthemia assoc with  noct desat min copd >  2lpm x hs only x 2017 per [facility] but requested transfer of care to [place] so self referred to pulmonary clinic 06/29/2017        History of Present Illness  06/29/2017 1st [facility] office visit/ Terianne Thaker   Chief Complaint  Patient presents with   Pulmonary Consult    Self referral. She has seen Dr. Delton Coombes in the past- last in 2012. She has been seeing Dr. Katrine Coho at [facility] and is here today to re-est care. She c/o DOE with cleaning her house or taking out the trash. She is on O2 with sleep at 2lpm. She uses proiar 1 x per wk on average and has a neb with albuterol that she rarely uses.   doe x [facility] = can't walk a nl pace on a flat grade s sob but does fine slow and flat eg shopping at slow pace / can't do ramps Doe Never changed since empyema despite having RT and chemo for BAC dx 09/14/09 at [facility] with [facility] 11/2009 and persistent bilateral MPN's under surveillance at [facility] but no recent bx's or further chemo.  Uses  advair 250 twice daily ? Benefit > saba doesn't really help any of her symptoms/ no signifcant chronic or assoc  Coughing. rec Plan A = Automatic = bevespi Take 2 puffs first thing in am and then another 2 puffs about 12 hours later.  Work on inhaler technique: Plan B = Backup Only use your albuterol as a rescue medication  Plan C = Crisis - only use your albuterol nebulizer if you first try Plan B and it fails to help > ok to use the nebulizer up to every 4 hours but if start needing it regularly call for immediate appointment        07/28/2022  f/u ov/Shenika Quint re: GOLD 2 copd/ 02 3lpm hs/ POC during the day maint on trelegy   Chief Complaint   Patient presents with   Follow-up    Dyspnea and SOB worse.  Oxygen qhs and prn during day.   Dyspnea:  room to room no longer checking sats  Cough: slt greenish/ assoc watery rhinitis  Sleeping: flat bed 2 pillows no resp cc  SABA use: twice a week  02: not needing sitting/ 3lpm walking  Rec Make sure you check your oxygen saturation  AT  your highest level of activity (not after you stop)   to be sure it stays over 90%   Also  Ok to try albuterol 15 min before an activity (on alternating days)  that you know would usually make you short of breath   11/23/2023  f/u ov/Deano Tomaszewski re: GOLD 2 copd /02 dep   maint on Trelegy   Chief Complaint  Patient presents with   Follow-up  Dyspnea:  doing PT but nothing aerobic/ uses 02 2lpm but not checking sats with ex or at rest  Cough: some in am rattle slt yellow > 1 y assoc with nasal congestion rx zyrtec/ flonase  Sleeping: electric bed minimally elevated 2 pillows s  resp cc  SABA use: none  02: 2lpm hs  No obvious day to day or daytime variability or assoc excess/ purulent sputum or mucus plugs or hemoptysis or cp or chest tightness, subjective wheeze or overt   hb symptoms.    Also denies any obvious fluctuation of symptoms with weather or environmental changes or other aggravating or alleviating factors except as outlined above   No unusual exposure hx or h/o childhood pna/ asthma or knowledge of premature birth.  Current Allergies, Complete Past Medical History, Past Surgical History, Family History, and Social History were reviewed in [employer] record.  ROS  The following are not active complaints unless bolded Hoarseness, sore throat, dysphagia, dental problems, itching, sneezing,  nasal congestion or discharge of excess mucus or purulent secretions, ear ache,   fever, chills, sweats, unintended wt loss or wt gain, classically pleuritic or exertional cp,  orthopnea pnd or arm/hand swelling  or leg swelling,  presyncope, palpitations, abdominal pain, anorexia, nausea, vomiting, diarrhea  or change in bowel habits or change in bladder habits, change in stools or change in urine, dysuria, hematuria,  rash, arthralgias, visual complaints, headache, numbness, weakness or ataxia or problems with walking or coordination,  change in mood or  memory.        Current Meds  Medication Sig   albuterol (VENTOLIN HFA) 108 (90 Base) MCG/ACT inhaler Inhale 1 puff into the lungs every 6 (six) hours as needed for wheezing or shortness of breath.   buPROPion (WELLBUTRIN XL) 150 MG 24 hr tablet Take 1 tablet (150 mg total) by mouth daily.   CALCIUM PO Take 1,200 mg by mouth every other day.   Cholecalciferol (VITAMIN D3) 1.25 MG (50000 UT) CAPS Take 50,000 Units by mouth every Sunday.   Cyanocobalamin 1000 MCG CAPS Take 1,000 mcg by mouth daily.   FLUoxetine (PROZAC) 20 MG tablet Take 2 tablets (40 mg total) by mouth daily. Stop duloxetine (Patient taking differently: Take 40 mg by mouth daily.)   fluticasone (FLONASE) 50 MCG/ACT nasal spray Place 2 sprays into both nostrils daily.   Fluticasone-Umeclidin-Vilant (TRELEGY ELLIPTA) 100-62.5-25 MCG/ACT AEPB Inhale 1 puff into the lungs daily.   levothyroxine (SYNTHROID) 125 MCG tablet Take 1 tablet (125 mcg total) by mouth daily. (Patient taking differently: Take 125 mcg by mouth daily before breakfast.)   losartan-hydrochlorothiazide (HYZAAR) 100-25 MG tablet Take 1 tablet by mouth daily.   naproxen sodium (ALEVE) 220 MG tablet Take 220-440 mg by mouth 2 (two) times daily as needed (for pain- TAKE WITH FOOD).   OXYGEN Inhale 2 L/min into the lungs See admin instructions. Inhale 2 L/min into the lungs at bedtime and during the day as needed for shortness of breath   rosuvastatin (CRESTOR) 20 MG tablet Take 1 tablet (20 mg total) by mouth daily.   traZODone (DESYREL) 50 MG tablet Take 1 tablet (50 mg total) by mouth at bedtime.   TYLENOL 500 MG tablet Take 500-1,000 mg by  mouth every 6 (six) hours as needed (for rib pain).              Objective:   Physical Exam   11/23/2023         201  07/28/2022        196  07/06/2020     183  04/09/2019       173  03/10/2019       179 02/05/2019       179  07/19/2018       17 3  06/19/2018  175  08/10/2017     170   06/29/17 166 lb (75.3 kg)  05/28/17 164 lb (74.4 kg)  05/03/17 166 lb (75.3 kg)    Vital signs reviewed  11/23/2023  - Note at rest 02 sats  95% on 2lpm POC  and 93% p 5 min RA sitting    General appearance:    looks < stated age using rollator    HEENT : Oropharynx  clear   Nasal turbinates mild edema   NECK :  without  apparent JVD/ palpable Nodes/TM    LUNGS: no acc muscle use,  Min barrel  contour chest wall with bilateral  slightly decreased bs s audible wheeze and  without cough on insp or exp maneuvers and min  Hyperresonant  to  percussion bilaterally    CV:  RRR  no s3 or murmur or increase in P2, and no edema   ABD:  soft and nontender with pos end  insp Hoover's  in the supine position.  No bruits or organomegaly appreciated   MS:  Nl gait/ ext warm without deformities Or obvious joint restrictions  calf tenderness, cyanosis or clubbing     SKIN: warm and dry without lesions    NEURO:  alert, approp, nl sensorium with  no motor or cerebellar deficits apparent.           I personally reviewed images and agree with radiology impression as follows:   Chest CT c contrast 10/30/23    1. Stable treatment site in the LEFT upper lobe. 2. Stable small nodule superior to the dominant mass in the LEFT upper lobe. 3. Stable solid and ground-glass nodules in the RIGHT lower lobe. 4. No new pulmonary nodules. 5. No thoracic adenopathy. 6. No evidence skeletal metastasis     Assessment:

## 2023-11-28 ENCOUNTER — Encounter: Payer: Self-pay | Admitting: Family Medicine

## 2023-11-30 DIAGNOSIS — R55 Syncope and collapse: Secondary | ICD-10-CM | POA: Diagnosis not present

## 2023-12-06 ENCOUNTER — Other Ambulatory Visit: Payer: Self-pay

## 2023-12-06 DIAGNOSIS — R55 Syncope and collapse: Secondary | ICD-10-CM

## 2023-12-06 DIAGNOSIS — I471 Supraventricular tachycardia, unspecified: Secondary | ICD-10-CM

## 2023-12-06 MED ORDER — METOPROLOL SUCCINATE ER 25 MG PO TB24: 25.0000 mg | ORAL_TABLET | Freq: Every day | ORAL | 3 refills | Status: DC

## 2023-12-12 ENCOUNTER — Ambulatory Visit: Payer: Self-pay | Admitting: Family Medicine

## 2023-12-12 ENCOUNTER — Emergency Department (HOSPITAL_COMMUNITY)

## 2023-12-12 ENCOUNTER — Ambulatory Visit: Payer: Self-pay | Admitting: Internal Medicine

## 2023-12-12 ENCOUNTER — Emergency Department (HOSPITAL_COMMUNITY)
Admission: EM | Admit: 2023-12-12 | Discharge: 2023-12-12 | Disposition: A | Discharge status: Home / Self Care | Attending: Emergency Medicine | Admitting: Emergency Medicine

## 2023-12-12 ENCOUNTER — Other Ambulatory Visit: Payer: Self-pay

## 2023-12-12 DIAGNOSIS — R0602 Shortness of breath: Secondary | ICD-10-CM | POA: Insufficient documentation

## 2023-12-12 DIAGNOSIS — J441 Chronic obstructive pulmonary disease with (acute) exacerbation: Secondary | ICD-10-CM

## 2023-12-12 DIAGNOSIS — J449 Chronic obstructive pulmonary disease, unspecified: Secondary | ICD-10-CM | POA: Insufficient documentation

## 2023-12-12 DIAGNOSIS — R918 Other nonspecific abnormal finding of lung field: Secondary | ICD-10-CM | POA: Diagnosis not present

## 2023-12-12 DIAGNOSIS — R059 Cough, unspecified: Secondary | ICD-10-CM | POA: Diagnosis not present

## 2023-12-12 DIAGNOSIS — J984 Other disorders of lung: Secondary | ICD-10-CM | POA: Diagnosis not present

## 2023-12-12 DIAGNOSIS — I517 Cardiomegaly: Secondary | ICD-10-CM | POA: Diagnosis not present

## 2023-12-12 LAB — I-STAT CHEM 8, ED
BUN: 20 mg/dL (ref 8–23)
Calcium, Ion: 1.17 mmol/L (ref 1.15–1.40)
Chloride: 103 mmol/L (ref 98–111)
Creatinine, Ser: 0.9 mg/dL (ref 0.44–1.00)
Glucose, Bld: 86 mg/dL (ref 70–99)
HCT: 38 % (ref 36.0–46.0)
Hemoglobin: 12.9 g/dL (ref 12.0–15.0)
Potassium: 3.7 mmol/L (ref 3.5–5.1)
Sodium: 139 mmol/L (ref 135–145)
TCO2: 27 mmol/L (ref 22–32)

## 2023-12-12 LAB — COMPREHENSIVE METABOLIC PANEL WITH GFR
ALT: 14 U/L (ref 0–44)
AST: 18 U/L (ref 15–41)
Albumin: 3.5 g/dL (ref 3.5–5.0)
Alkaline Phosphatase: 50 U/L (ref 38–126)
Anion gap: 5 (ref 5–15)
BUN: 22 mg/dL (ref 8–23)
CO2: 29 mmol/L (ref 22–32)
Calcium: 8.6 mg/dL — ABNORMAL LOW (ref 8.9–10.3)
Chloride: 103 mmol/L (ref 98–111)
Creatinine, Ser: 0.72 mg/dL (ref 0.44–1.00)
GFR, Estimated: >60 mL/min (ref >60)
Glucose, Bld: 92 mg/dL (ref 70–99)
Potassium: 3.8 mmol/L (ref 3.5–5.1)
Sodium: 137 mmol/L (ref 135–145)
Total Bilirubin: 0.3 mg/dL (ref 0.0–1.2)
Total Protein: 6.1 g/dL — ABNORMAL LOW (ref 6.5–8.1)

## 2023-12-12 LAB — CBC WITH DIFFERENTIAL/PLATELET
Abs Immature Granulocytes: 0.04 10*3/uL (ref 0.00–0.07)
Basophils Absolute: 0.1 10*3/uL (ref 0.0–0.1)
Basophils Relative: 1 %
Eosinophils Absolute: 0.2 10*3/uL (ref 0.0–0.5)
Eosinophils Relative: 2 %
HCT: 38.7 % (ref 36.0–46.0)
Hemoglobin: 12.6 g/dL (ref 12.0–15.0)
Immature Granulocytes: 0 %
Lymphocytes Relative: 21 %
Lymphs Abs: 1.9 10*3/uL (ref 0.7–4.0)
MCH: 32.6 pg (ref 26.0–34.0)
MCHC: 32.6 g/dL (ref 30.0–36.0)
MCV: 100.3 fL — ABNORMAL HIGH (ref 80.0–100.0)
Monocytes Absolute: 0.9 10*3/uL (ref 0.1–1.0)
Monocytes Relative: 10 %
Neutro Abs: 6 10*3/uL (ref 1.7–7.7)
Neutrophils Relative %: 66 %
Platelets: 259 10*3/uL (ref 150–400)
RBC: 3.86 MIL/uL — ABNORMAL LOW (ref 3.87–5.11)
RDW: 13.8 % (ref 11.5–15.5)
WBC: 9.1 10*3/uL (ref 4.0–10.5)
nRBC: 0 % (ref 0.0–0.2)

## 2023-12-12 LAB — BRAIN NATRIURETIC PEPTIDE: B Natriuretic Peptide: 277 pg/mL — ABNORMAL HIGH (ref 0.0–100.0)

## 2023-12-12 LAB — RESP PANEL BY RT-PCR (RSV, FLU A&B, COVID)  RVPGX2
Influenza A by PCR: NEGATIVE
Influenza B by PCR: NEGATIVE
Resp Syncytial Virus by PCR: NEGATIVE
SARS Coronavirus 2 by RT PCR: NEGATIVE

## 2023-12-12 LAB — TROPONIN I (HIGH SENSITIVITY): Troponin I (High Sensitivity): 6 ng/L (ref <18)

## 2023-12-12 MED ORDER — PREDNISONE 50 MG PO TABS: ORAL_TABLET | ORAL | 0 refills | Status: DC

## 2023-12-12 MED ORDER — ALBUTEROL SULFATE (2.5 MG/3ML) 0.083% IN NEBU
INHALATION_SOLUTION | RESPIRATORY_TRACT | Status: AC
  Filled 2023-12-12: qty 6

## 2023-12-12 MED ORDER — METHYLPREDNISOLONE SODIUM SUCC 125 MG IJ SOLR
125.0000 mg | Freq: Once | INTRAMUSCULAR | Status: AC
  Administered 2023-12-12: 125 mg via INTRAVENOUS
  Filled 2023-12-12: qty 2

## 2023-12-12 MED ORDER — IPRATROPIUM BROMIDE 0.02 % IN SOLN
0.5000 mg | Freq: Once | RESPIRATORY_TRACT | Status: AC
  Administered 2023-12-12: 0.5 mg via RESPIRATORY_TRACT
  Filled 2023-12-12: qty 2.5

## 2023-12-12 MED ORDER — ALBUTEROL SULFATE (2.5 MG/3ML) 0.083% IN NEBU
5.0000 mg | INHALATION_SOLUTION | Freq: Once | RESPIRATORY_TRACT | Status: AC
  Administered 2023-12-12: 5 mg via RESPIRATORY_TRACT
  Filled 2023-12-12: qty 6

## 2023-12-12 NOTE — ED Provider Notes (Signed)
 [place] EMERGENCY DEPARTMENT AT [facility] Provider Note   CSN: [id number] Arrival date & time: 12/12/23  1314     History  Chief Complaint  Patient presents with   Shortness of Breath    [name] is a 83 y.o. female.  83 year old female presents with 1/2 weeks of shortness of breath.  She has noted increasing cough and congestion.  Possible subjective fevers.  No chest pain noted.  No vomiting or diarrhea.  Denies any orthopnea.  Patient is on oxygen at 2 L chronically.  Has had history of cardiac arrhythmia and was put on metoprolol which is since help with that.  Review of her record shows that she called her pulmonologist today and was told to come here.       Home Medications Prior to Admission medications   Medication Sig Start Date End Date Taking? Authorizing Provider  metoprolol succinate (TOPROL-XL) 25 MG 24 hr tablet Take 1 tablet (25 mg total) by mouth daily. 12/06/23   Donita Brooks, MD  albuterol (VENTOLIN HFA) 108 (90 Base) MCG/ACT inhaler Inhale 1 puff into the lungs every 6 (six) hours as needed for wheezing or shortness of breath. 06/14/23   Donita Brooks, MD  buPROPion (WELLBUTRIN XL) 150 MG 24 hr tablet Take 1 tablet (150 mg total) by mouth daily. 09/24/23   Donita Brooks, MD  CALCIUM PO Take 1,200 mg by mouth every other day.    [provider]  Cholecalciferol (VITAMIN D3) 1.25 MG (50000 UT) CAPS Take 50,000 Units by mouth every Sunday.    [provider]  Cyanocobalamin 1000 MCG CAPS Take 1,000 mcg by mouth daily.    [provider]  FLUoxetine (PROZAC) 20 MG tablet Take 2 tablets (40 mg total) by mouth daily. Stop duloxetine Patient taking differently: Take 40 mg by mouth daily. 06/14/23   Donita Brooks, MD  fluticasone (FLONASE) 50 MCG/ACT nasal spray Place 2 sprays into both nostrils daily. 06/14/23   Donita Brooks, MD  Fluticasone-Umeclidin-Vilant (TRELEGY ELLIPTA) 100-62.5-25 MCG/ACT  AEPB Inhale 1 puff into the lungs daily. 06/14/23   Donita Brooks, MD  levothyroxine (SYNTHROID) 125 MCG tablet Take 1 tablet (125 mcg total) by mouth daily. Patient taking differently: Take 125 mcg by mouth daily before breakfast. 06/14/23   Donita Brooks, MD  losartan-hydrochlorothiazide (HYZAAR) 100-25 MG tablet Take 1 tablet by mouth daily. 06/14/23   Donita Brooks, MD  naproxen sodium (ALEVE) 220 MG tablet Take 220-440 mg by mouth 2 (two) times daily as needed (for pain- TAKE WITH FOOD).    [provider]  OXYGEN Inhale 2 L/min into the lungs See admin instructions. Inhale 2 L/min into the lungs at bedtime and during the day as needed for shortness of breath    [provider]  rosuvastatin (CRESTOR) 20 MG tablet Take 1 tablet (20 mg total) by mouth daily. 06/14/23   Donita Brooks, MD  traZODone (DESYREL) 50 MG tablet Take 1 tablet (50 mg total) by mouth at bedtime. 10/26/23   Donita Brooks, MD  TYLENOL 500 MG tablet Take 500-1,000 mg by mouth every 6 (six) hours as needed (for rib pain).    [provider]      Allergies    Sulfonamide derivatives, Erythromycin, Amoxicillin, and Doxycycline    Review of Systems   Review of Systems  All other systems reviewed and are negative.   Physical Exam Updated Vital Signs BP (!) 143/73  Pulse (!) 51   Temp 97.7 F (36.5 C) (Oral)   Resp 17   SpO2 98%  Physical Exam Vitals and nursing note reviewed.  Constitutional:      General: She is not in acute distress.    Appearance: Normal appearance. She is well-developed. She is not toxic-appearing.  HENT:     Head: Normocephalic and atraumatic.  Eyes:     General: Lids are normal.     Conjunctiva/sclera: Conjunctivae normal.     Pupils: Pupils are equal, round, and reactive to light.  Neck:     Thyroid: No thyroid mass.     Trachea: No tracheal deviation.  Cardiovascular:     Rate and Rhythm: Normal rate and regular rhythm.     Heart sounds:  Normal heart sounds. No murmur heard.    No gallop.  Pulmonary:     Effort: Tachypnea and prolonged expiration present. No respiratory distress.     Breath sounds: Normal breath sounds. No stridor. No decreased breath sounds, wheezing, rhonchi or rales.  Abdominal:     General: There is no distension.     Palpations: Abdomen is soft.     Tenderness: There is no abdominal tenderness. There is no rebound.  Musculoskeletal:        General: No tenderness. Normal range of motion.     Cervical back: Normal range of motion and neck supple.  Skin:    General: Skin is warm and dry.     Findings: No abrasion or rash.  Neurological:     Mental Status: She is alert and oriented to person, place, and time. Mental status is at baseline.     GCS: GCS eye subscore is 4. GCS verbal subscore is 5. GCS motor subscore is 6.     Cranial Nerves: No cranial nerve deficit.     Sensory: No sensory deficit.     Motor: Motor function is intact.  Psychiatric:        Attention and Perception: Attention normal.        Speech: Speech normal.        Behavior: Behavior normal.     ED Results / Procedures / Treatments   Labs (all labs ordered are listed, but only abnormal results are displayed) Labs Reviewed  RESP PANEL BY RT-PCR (RSV, FLU A&B, COVID)  [id number]  COMPREHENSIVE METABOLIC PANEL WITH GFR  CBC WITH DIFFERENTIAL/PLATELET  BRAIN NATRIURETIC PEPTIDE  I-STAT CHEM 8, ED  TROPONIN I (HIGH SENSITIVITY)    EKG EKG Interpretation Date/Time:  Wednesday December 12 2023 13:37:57 EDT Ventricular Rate:  50 PR Interval:  154 QRS Duration:  86 QT Interval:  461 QTC Calculation: 421 R Axis:   10  Text Interpretation: Sinus rhythm Atrial premature complex Abnrm T, consider ischemia, anterolateral lds Confirmed by Lorre Nick ([id number]) on 12/12/2023 1:55:00 PM  Radiology No results found.  Procedures Procedures    Medications Ordered in ED Medications  albuterol (PROVENTIL) (2.5 MG/3ML) 0.083%  nebulizer solution 5 mg (has no administration in time range)  ipratropium (ATROVENT) nebulizer solution 0.5 mg (has no administration in time range)    ED Course/ Medical Decision Making/ A&P                                 Medical Decision Making Amount and/or Complexity of Data Reviewed Labs: ordered. Radiology: ordered.  Risk Prescription drug management.   Patient's EKG shows normal sinus rhythm.  Has history of COPD.  Chest x-ray per radiology shows no acute findings.  Patient given albuterol and feels better.  Also given Solu-Medrol. Suspect that patient's symptoms are from her likely COPD exacerbation.  COVID and flu test were also negative.  She is on home oxygen.  Will discharge home on prednisone and she will use her albuterol inhaler       Final Clinical Impression(s) / ED Diagnoses Final diagnoses:  None    Rx / DC Orders ED Discharge Orders     None         Lorre Nick, MD 12/12/23 [phone]

## 2023-12-12 NOTE — Telephone Encounter (Signed)
  Chief Complaint: SOB Symptoms: speaks in phrases, productive cough Frequency: x 3 days Pertinent Negatives: Patient denies fever, URI sx Disposition: [x] ED /[] Urgent Care (no appt availability in office) / [] Appointment(In office/virtual)/ []  [facility]/ [] Home Care/ [] Refused Recommended Disposition /[] [facility]/ []  Follow-up with PCP Additional Notes: Pt c/o SOB x 3 days even with O2. Denies fever, URI sx. Triager does appreciate pt speaking in phrases. Based on sx gathered as well as SpO2, triager advised ED. Caregiver was heard in the background and voiced that she is 5 mins from a [facility], and will take patient to be evaluated. Caregiver verbalized understanding and to go to ED/call 911 with worsening symptoms.    Copied from CRM [id number]. Topic: Clinical - Red Word Triage >> Dec 12, 2023 12:39 PM [name] wrote: Red Word that prompted transfer to Nurse Triage: shortness of breath, diffuclty breathing, not feeling well at all Reason for Disposition  Sounds like a life-threatening emergency to the triager  Answer Assessment - Initial Assessment Questions E2C2 Pulmonary Triage - Initial Assessment Questions "Chief Complaint (e.g., cough, sob, wheezing, fever, chills, sweat or additional symptoms) *Go to specific symptom protocol after initial questions. SOB even with O2, productive yellow/green cough, "not feeling well" "not sure if it'[name] due to my oxygen levels" Hx bilateral lung cx - saw Dr. Sherene Sires 3 weeks ago  "How long have symptoms been present?" About 3 days - thinks r/t pollen  Have you tested for COVID or Flu? Note: If not, ask patient if a home test can be taken. If so, instruct patient to call back for positive results. No    OXYGEN: "Do you wear supplemental oxygen?" Yes If yes, "How many liters are you supposed to use?" 2L ATC x 2-3 days  "Do you monitor your oxygen levels?" Yes If yes, "What is your reading (oxygen level) today?" 68% on 2L  O2- confirmed by caregiver with pt Of note, pt with caregiver and can hear her in the background.  4. SEVERITY: "How bad is your breathing?" (e.g., mild, moderate, severe)    - MILD: No SOB at rest, mild SOB with walking, speaks normally in sentences, can lie down, no retractions, pulse < 100.    - MODERATE: SOB at rest, SOB with minimal exertion and prefers to sit, cannot lie down flat, speaks in phrases, mild retractions, audible wheezing, pulse 100-120.    - SEVERE: Very SOB at rest, speaks in single words, struggling to breathe, sitting hunched forward, retractions, pulse > 120      Moderate - Audible SOB, speaks in phrases 6. CARDIAC HISTORY: "Do you have any history of heart disease?" (e.g., heart attack, angina, bypass surgery, angioplasty)      N/a per chart 7. LUNG HISTORY: "Do you have any history of lung disease?"  (e.g., pulmonary embolus, asthma, emphysema)     Lung cancer hx 8. CAUSE: "What do you think is causing the breathing problem?"      Pollen, exacerbation  Protocols used: Breathing Difficulty-A-AH

## 2023-12-12 NOTE — ED Triage Notes (Signed)
 Pt c/o worsening shortness of breath for 1.5 weeks, productive cough, pt on HOT 2L. Pt reports starting metoprolol 4 days ago bc "runs of Vtach and PVCs", pt 98% on 2L, work of breathing noted

## 2023-12-12 NOTE — Telephone Encounter (Signed)
 Dr. Sherene Sires, routing this to you for FYI.  Patient is at ED now.  Thank you.

## 2023-12-12 NOTE — Telephone Encounter (Signed)
 This RN called and lvm with call back number. Per chart review pt is at ED so will route to office.  Copied from CRM [id number]. Topic: Appointments - Appointment Scheduling >> Dec 12, 2023 12:25 PM [name] wrote: pneumonia Pls call her [phone]

## 2023-12-13 ENCOUNTER — Encounter: Payer: Self-pay | Admitting: Internal Medicine

## 2023-12-13 DIAGNOSIS — J449 Chronic obstructive pulmonary disease, unspecified: Secondary | ICD-10-CM

## 2023-12-13 DIAGNOSIS — J9611 Chronic respiratory failure with hypoxia: Secondary | ICD-10-CM

## 2023-12-14 ENCOUNTER — Telehealth: Payer: Self-pay | Admitting: Internal Medicine

## 2023-12-14 DIAGNOSIS — J449 Chronic obstructive pulmonary disease, unspecified: Secondary | ICD-10-CM

## 2023-12-14 NOTE — Telephone Encounter (Signed)
 Per patricia at [employer]- We have to have 'administration kit and filter' loaded on the prescription in addition to the nebulizer itself since those are billable items. A different option is to load the specific HCPCs for the kit and filter.  If the PT needs just supplies we would not be able to provide due to patient not having a neb machine with [place].

## 2023-12-17 NOTE — Telephone Encounter (Signed)
 Order updated to include admin kit and filter

## 2023-12-18 ENCOUNTER — Ambulatory Visit: Payer: TRICARE For Life (TFL) | Admitting: Internal Medicine

## 2023-12-20 ENCOUNTER — Ambulatory Visit: Admitting: Internal Medicine

## 2023-12-20 ENCOUNTER — Encounter: Payer: Self-pay | Admitting: Internal Medicine

## 2023-12-20 ENCOUNTER — Ambulatory Visit: Payer: Self-pay

## 2023-12-20 VITALS — BP 154/66 | HR 61 | Ht 66.0 in | Wt 204.0 lb

## 2023-12-20 DIAGNOSIS — J9611 Chronic respiratory failure with hypoxia: Secondary | ICD-10-CM

## 2023-12-20 DIAGNOSIS — J449 Chronic obstructive pulmonary disease, unspecified: Secondary | ICD-10-CM

## 2023-12-20 MED ORDER — PANTOPRAZOLE SODIUM 40 MG PO TBEC
40.0000 mg | DELAYED_RELEASE_TABLET | Freq: Every day | ORAL | 2 refills | Status: DC
Start: 2023-12-20 — End: 2024-03-10

## 2023-12-20 MED ORDER — FAMOTIDINE 20 MG PO TABS: ORAL_TABLET | ORAL | 11 refills | Status: DC

## 2023-12-20 NOTE — Patient Instructions (Addendum)
 Pantoprazole (protonix) 40 mg   Take  30-60 min before first meal of the day and Pepcid (famotidine)  20 mg after supper until return to office - this is the best way to tell whether stomach acid is contributing to your problem.    GERD (REFLUX)  is an extremely common cause of respiratory symptoms just like yours , many times with no obvious heartburn at all.    It can be treated with medication, but also with lifestyle changes including elevation of the head of your bed (ideally with 6 -8inch blocks under the headboard of your bed),  Smoking cessation, avoidance of late meals, excessive alcohol, and avoid fatty foods, chocolate, peppermint, colas, red wine, and acidic juices such as orange juice.  NO MINT OR MENTHOL PRODUCTS SO NO COUGH DROPS  USE SUGARLESS CANDY INSTEAD (Jolley ranchers or Stover's or Life Savers) or even ice chips will also do - the key is to swallow to prevent all throat clearing. NO OIL BASED VITAMINS - use powdered substitutes.  Avoid fish oil when coughing.   Try sleeping in recliner at 60 degrees   Make sure you check your oxygen saturation  AT  your highest level of activity (not after you stop)   to be sure it stays over 90% and adjust  02 flow upward to maintain this level if needed but remember to turn it back to previous settings when you stop (to conserve your supply).   Please schedule a follow up office visit in 4 weeks, sooner if needed  with all medications /inhalers/ solutions in hand so we can verify exactly what you are taking. This includes all medications from all doctors and over the counters

## 2023-12-20 NOTE — Telephone Encounter (Signed)
Office visit scheduled for today.

## 2023-12-20 NOTE — Progress Notes (Signed)
 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 She has seen Dr. Baldwin [name] in Donna past- last in 2012. She has been seeing Dr. Annalee [name] at [name] and is here today to re-est care. She c/o DOE with cleaning her house or taking out Donna trash. She is on O2 with sleep at 2lpm. She uses proiar 1 x per wk on average and has a neb with albuterol that she rarely uses.   doe x [name] = can't walk a nl pace on a flat grade s sob but does fine slow and flat eg shopping at slow pace / can't do ramps Doe Never changed since empyema despite having RT and chemo for BAC dx 09/14/09 at [name] with [facility] 11/2009 and persistent bilateral MPN's under surveillance at [name] but no recent bx's or further chemo.  Uses  advair 250 twice daily ? Benefit > saba doesn't really help any of her symptoms/ no signifcant chronic or assoc  Coughing. rec Plan A = Automatic = bevespi Take 2 puffs first thing in am and then another 2 puffs about 12 hours later.  Work on inhaler technique: Plan B = Backup Only use your albuterol as a rescue medication  Plan C = Crisis - only use your albuterol nebulizer if you first try Plan B and it fails to help > ok to use Donna nebulizer up to every 4 hours but if start needing it regularly call for immediate appointment     07/28/2022  f/u ov/[name] re: GOLD 2 copd/ 02 3lpm hs/ POC during Donna day maint on trelegy   Chief Complaint  Patient  presents with   Follow-up    Dyspnea and SOB worse.  Oxygen qhs and prn during day.   Dyspnea:  room to room no longer checking sats  Cough: slt greenish/ assoc watery rhinitis  Sleeping: flat bed 2 pillows no resp cc  SABA use: twice a week  02: not needing sitting/ 3lpm walking  Rec Make sure you check your oxygen saturation  AT  your highest level of activity (not after you stop)   to be sure it stays over 90%   Also  Ok to try albuterol 15 min before an activity (on alternating days)  that you know would usually make you short of breath   11/23/2023  f/u ov/[name] re: GOLD 2 copd /02 dep   maint on Trelegy   Chief Complaint  Patient presents with   Follow-up  Dyspnea:  doing PT but nothing aerobic/ uses 02 2lpm but not checking sats with ex or at rest  Cough: some in am rattle slt yellow > 1 y assoc with nasal congestion rx zyrtec/ flonase  Sleeping: electric bed minimally elevated 2 pillows s  resp cc  SABA use: none  02: 2lpm hs   Rec Continue trelegy/ monitor sats at  peak ex   12/12/23 ER eval BNP 227 / troponin nl / cxr no acute changes    12/20/2023  post ER f/u ov/[name]/[name] re: GOLD 2 copd  maint on trelegy x pred from ER  Chief Complaint  Patient presents with   Shortness of Breath   Dyspnea:  worse x 2 months, 2 trips to ER > no better  Cough: no change from usual  Sleeping: 30 degrees electric or gets sob  nightly  SABA use: no better  02: 3lpm      No obvious day to day or daytime variability or assoc excess/ purulent sputum or mucus plugs or hemoptysis or cp or chest tightness, subjective wheeze or overt   hb symptoms.    Also denies any obvious fluctuation of symptoms with weather or environmental changes or other aggravating or alleviating factors except as outlined above   No unusual exposure hx or h/o childhood pna/ asthma or knowledge of premature birth.  Current Allergies, Complete Past Medical History, Past Surgical History, Family History,  and Social History were reviewed in [name] record.  ROS  Donna following are not active complaints unless bolded Hoarseness, sore throat, dysphagia, dental problems, itching, sneezing,  nasal congestion or discharge of excess mucus or purulent secretions, ear ache,   fever, chills, sweats, unintended wt loss or wt gain, classically pleuritic or exertional cp,  orthopnea pnd or arm/hand swelling  or leg swelling, presyncope, palpitations, abdominal pain, anorexia, nausea, vomiting, diarrhea  or change in bowel habits or change in bladder habits, change in stools or change in urine, dysuria, hematuria,  rash, arthralgias, visual complaints, headache, numbness, weakness or ataxia or problems with walking or coordination,  change in mood/ anxious  or  memory.        Current Meds  Medication Sig   albuterol (VENTOLIN HFA) 108 (90 Base) MCG/ACT inhaler Inhale 1 puff into Donna lungs every 6 (six) hours as needed for wheezing or shortness of breath.   buPROPion (WELLBUTRIN XL) 150 MG 24 hr tablet Take 1 tablet (150 mg total) by mouth daily.   CALCIUM PO Take 1,200 mg by mouth every other day.   Cholecalciferol (VITAMIN D3) 1.25 MG (50000 UT) CAPS Take 50,000 Units by mouth every Sunday.   Cyanocobalamin 1000 MCG CAPS Take 1,000 mcg by mouth daily.   FLUoxetine (PROZAC) 20 MG tablet Take 2 tablets (40 mg total) by mouth daily. Stop duloxetine (Patient taking differently: Take 40 mg by mouth daily.)   fluticasone (FLONASE) 50 MCG/ACT nasal spray Place 2 sprays into both nostrils daily.   Fluticasone-Umeclidin-Vilant (TRELEGY ELLIPTA) 100-62.5-25 MCG/ACT AEPB Inhale 1 puff into Donna lungs daily.   levothyroxine (SYNTHROID) 125 MCG tablet Take 1 tablet (125 mcg total) by mouth daily. (Patient taking differently: Take 125 mcg by mouth daily before breakfast.)   losartan-hydrochlorothiazide (HYZAAR) 100-25 MG tablet Take 1 tablet by mouth daily.   metoprolol succinate (TOPROL-XL) 25 MG 24  hr tablet Take 1 tablet (25 mg total) by mouth daily.   naproxen sodium (ALEVE) 220 MG tablet Take 220-440 mg by mouth 2 (two) times daily as needed (for pain- TAKE WITH FOOD).   OXYGEN Inhale 2 L/min into Donna lungs See admin instructions. Inhale 2 L/min into Donna lungs at bedtime and during Donna day as needed for shortness of breath   rosuvastatin (CRESTOR) 20 MG tablet Take 1 tablet (20 mg total) by mouth daily.   traZODone (DESYREL) 50 MG tablet Take 1 tablet (50  mg total) by mouth at bedtime.   TYLENOL 500 MG tablet Take 500-1,000 mg by mouth every 6 (six) hours as needed (for rib pain).             Objective:   Physical Exam  12/20/2023        204   11/23/2023         201  07/28/2022        196  07/06/2020     183  04/09/2019       173  03/10/2019       179 02/05/2019       179  07/19/2018       173  06/19/2018       175  08/10/2017     170   06/29/17 166 lb (75.3 kg)  05/28/17 164 lb (74.4 kg)  05/03/17 166 lb (75.3 kg)    Vital signs reviewed  12/20/2023  - Note at rest 02 sats  94% on 94  on 3lpm POC  General appearance:    amb anxious wf walks with rollator/ has trouble focusing on answering questions related to symptoms     HEENT : Oropharynx  clear   Nasal turbinates nl nl    NECK :  without  apparent JVD/ palpable Nodes/TM    LUNGS: no acc muscle use,  Min barrel  contour chest wall with bilateral  slightly decreased bs s audible wheeze and  without cough on insp or exp maneuvers and min  Hyperresonant  to  percussion bilaterally    CV:  RRR  no s3 or murmur or increase in P2, and no edema   ABD:  soft and nontender with pos end  insp Hoover's  in Donna supine position.  No bruits or organomegaly appreciated   MS:   ext warm without deformities Or obvious joint restrictions  calf tenderness, cyanosis or clubbing     SKIN: warm and dry without lesions    NEURO:  alert, approp, nl sensorium with  no motor or cerebellar deficits apparent.             Assessment:

## 2023-12-20 NOTE — Telephone Encounter (Signed)
 Copied from CRM [id number]. Topic: Clinical - Red Word Triage >> Dec 20, 2023  9:20 AM Chantha C wrote: Red Word that prompted transfer to Nurse Triage: Patient states is still having trouble with lungs, and has been in the hospital, finished with Prednisone and it still not feeling better. Patient states feels like heavy on chest- soreness, sweating, shortness of breathing, hard to take deep breath, and dizziness. Patient denies pain nor fever. Please advise [phone].  E2C2 Pulmonary Triage - Initial Assessment Questions "Chief Complaint (e.g., cough, sob, wheezing, fever, chills, sweat or additional symptoms) *Go to specific symptom protocol after initial questions. Dyspnea, Chest Heaviness  "How long have symptoms been present?" 2 Months  Have you tested for COVID or Flu? Note: If not, ask patient if a home test can be taken. If so, instruct patient to call back for positive results. Yes, Negative  MEDICINES:   "Have you used any OTC meds to help with symptoms?" No If yes, ask "What medications?" No OTC, recent ER visits.  "Have you used your inhalers/maintenance medication?" Yes If yes, "What medications?"   If inhaler, ask "How many puffs and how often?" Note: Review instructions on medication in the chart. Yes, more than prescribed.  OXYGEN: "Do you wear supplemental oxygen?" Yes If yes, "How many liters are you supposed to use?" 3L  "Do you monitor your oxygen levels?" Yes If yes, "What is your reading (oxygen level) today?" 93%  "What is your usual oxygen saturation reading?"  (Note: Pulmonary O2 sats should be 90% or greater) 90-92%  Reason for Disposition  [1] Longstanding difficulty breathing (e.g., CHF, COPD, emphysema) AND [2] WORSE than normal  Answer Assessment - Initial Assessment Questions 1. RESPIRATORY STATUS: "Describe your breathing?" (e.g., wheezing, shortness of breath, unable to speak, severe coughing)      Dyspnea, Chest Heaviness  2. ONSET: "When  did this breathing problem begin?"      Ongoing, 2 Months  3. PATTERN "Does the difficult breathing come and go, or has it been constant since it started?"      Constant  4. SEVERITY: "How bad is your breathing?" (e.g., mild, moderate, severe)    - MILD: No SOB at rest, mild SOB with walking, speaks normally in sentences, can lie down, no retractions, pulse < 100.    - MODERATE: SOB at rest, SOB with minimal exertion and prefers to sit, cannot lie down flat, speaks in phrases, mild retractions, audible wheezing, pulse 100-120.    - SEVERE: Very SOB at rest, speaks in single words, struggling to breathe, sitting hunched forward, retractions, pulse > 120      Moderate to Severe  5. RECURRENT SYMPTOM: "Have you had difficulty breathing before?" If Yes, ask: "When was the last time?" and "What happened that time?"      Yes, History of COPD  6. CARDIAC HISTORY: "Do you have any history of heart disease?" (e.g., heart attack, angina, bypass surgery, angioplasty)      SVT  7. LUNG HISTORY: "Do you have any history of lung disease?"  (e.g., pulmonary embolus, asthma, emphysema)     COPD, Lung Cancer  8. CAUSE: "What do you think is causing the breathing problem?"      Unsure  9. OTHER SYMPTOMS: "Do you have any other symptoms? (e.g., dizziness, runny nose, cough, chest pain, fever)     Dizziness  10. O2 SATURATION MONITOR:  "Do you use an oxygen saturation monitor (pulse oximeter) at home?" If Yes, ask: "What is your  reading (oxygen level) today?" "What is your usual oxygen saturation reading?" (e.g., 95%)       Averages 90-92%  11. PREGNANCY: "Is there any chance you are pregnant?" "When was your last menstrual period?"       No and No  12. TRAVEL: "Have you traveled out of the country in the last month?" (e.g., travel history, exposures)  Protocols used: Breathing Difficulty-A-AH

## 2023-12-22 NOTE — Assessment & Plan Note (Addendum)
 Quit smoking 2006 PFT's  08/07/11  FEV1 2.14 (89 % ) ratio 69  with DLCO  73 % corrects to 108 % for alv volume  - Spirometry 06/29/2017  FEV1 1.24 (52%)  Ratio 64 on advair -  Allergy profile 06/29/17  >  Eos 0.2/  IgE  13 RAST neg  - 06/29/2017  try bevespi  2bid > improved as of 08/10/18  -  Spirometry 06/19/2018  FEV1 1.4 (57%)  Ratio 64 p bevespi x 2 pffs   - PFT's  07/19/2018  FEV1 1.63  (74 % ) ratio 68  p 13 % improvement from saba p nothing prior to study with DLCO  71/70c % corrects to 93 % for alv volume    - 02/05/2019   continue bevespi  - 03/2019 changed to trelegy due to insurance  - 12/20/2023   Walked on 3l POC  x  3  lap(s) =  approx 450  ft  @ slow/ rollator pace, stopped due to end of study  with lowest 02 sats 94% and sob at end no cp/ tightness   DDX of  difficult airways management almost all start with A and  include Adherence, Ace Inhibitors, Acid Reflux, Active Sinus Disease, Alpha 1 Antitripsin deficiency, Anxiety masquerading as Airways dz,  ABPA,  Allergy(esp in young), Aspiration (esp in elderly), Adverse effects of meds,  Active smoking or vaping, A bunch of PE's (a small clot burden can't cause this syndrome unless there is already severe underlying pulm or vascular dz with poor reserve) plus two Bs  = Bronchiectasis and Beta blocker use..and one C= CHF   BOLDED dx's of greatest concern - she has an Echo pending with new onset orthopnea but no overt chf on cxr/ bnp intermediate but GERD would also fit many of her symptoms plus a baseline level of axiety  For now/ rx for gerd with close f/u/ pursue echo as planned   F/u in 4 weeks with all meds in hand using a trust but verify approach to confirm accurate Medication  Reconciliation The principal here is that until we are certain that the  patients are doing what we've asked, it makes no sense to ask them to do more.

## 2023-12-22 NOTE — Assessment & Plan Note (Signed)
 See care everywhere pulmonary notes from 2016/ [facility] > rx 2lpm hs  - 06/19/2018  Walked RA x 3 laps @ 185 ft each stopped due to  End of study, moderately fast pace, no desat but sob at end    - 04/09/2019   [facility] RA  2 laps @  approx 273ft each @ moderate pace  stopped due to  Sob at end but sats still 91%  -  07/06/2020   Walked RA  approx   750 ft  @ moderate pace /awkward gait due to back pain  stopped due to  Sob with sats still 90%     - Resting RA sats 11/23/2023 = 93%  - 12/20/2023 sats ok walking on 3lpm POC x 450 ft but still sob at end   No change rx needed   Again advised: Make sure you check your oxygen saturation  AT  your highest level of activity (not after you stop)   to be sure it stays over 90% and adjust  02 flow upward to maintain this level if needed but remember to turn it back to previous settings when you stop (to conserve your supply).   Each maintenance medication was reviewed in detail including emphasizing most importantly the difference between maintenance and prns and under what circumstances the prns are to be triggered using an action plan format where appropriate.  Total time for H and P, chart review, counseling, reviewing dpi/neb/POC/ pulse ox device(s) , directly observing portions of ambulatory 02 saturation study/ and generating customized AVS unique to this office visit / same day charting =  40 min for    refractory respiratory  symptoms of uncertain etiology

## 2023-12-25 ENCOUNTER — Telehealth: Payer: Self-pay

## 2023-12-25 NOTE — Telephone Encounter (Signed)
 Copied from CRM [id number]. Topic: General - Other >> Dec 24, 2023  9:58 AM Margarette Shawl wrote: Reason for CRM:   Patient is calling in regards to order sent for nebulizer to [facility] 3 weeks ago. She was informed by [facility] today that they are not sure when they will be able to deliver her nebulizer equipment.   Patient is worried, due to urgency by provider to get equipment as soon as possible. She is requesting the order be sent to another DME company if possible, so that she can receive machine sooner.   She is also requesting an order for her oxygen concentrator she uses at night be sent to the new DME company, so that she is receiving all her equipment from one company.  CB#  [phone]

## 2023-12-25 NOTE — Telephone Encounter (Signed)
 Spoke with [facility] and they confirmed they do have order for neb machine/supplies. They have attempted to contact patient's daughter with no success. They need credit/debit card on file for billing purposes before they can send out neb machine.  Attempted to reach daughter, vm full. Contacted patient, spoke with her aide, relayed message to contact [employer] and phone number provided. She said they will contact them today. Nothing further needed at this time.

## 2023-12-26 ENCOUNTER — Telehealth: Payer: Self-pay | Admitting: Internal Medicine

## 2023-12-26 ENCOUNTER — Telehealth: Payer: Self-pay

## 2023-12-26 NOTE — Telephone Encounter (Signed)
 Copied from CRM [id number]. Topic: General - Other >> Dec 24, 2023  9:58 AM Margarette Shawl wrote: Reason for CRM:   Patient is calling in regards to order sent for nebulizer to [facility] 3 weeks ago. She was informed by [employer] [facility] today that they are not sure when they will be able to deliver her nebulizer equipment.   Patient is worried, due to urgency by provider to get equipment as soon as possible. She is requesting the order be sent to another DME company if possible, so that she can receive machine sooner.   She is also requesting an order for her oxygen concentrator she uses at night be sent to the new DME company, so that she is receiving all her equipment from one company.  CB#  [phone] >> Dec 25, 2023 10:39 AM Chantha C wrote: Patient's aid Angelia [phone] states patient cannot hardly breath, shortness of breath, and needs the medication and mask for nebulizer. Angelia spoke with nurse Carron Clap today about these issues, and was seen for shortness of breath. Angelia is asking for the medical supplies to be sent to [id number], [place] - [address] [place] [address] Phone: [phone] Fax: [phone]. Please advise and call back.    [facility], are you able to just change the DME name? Or does a whole new order need to be placed?

## 2023-12-26 NOTE — Telephone Encounter (Signed)
 Copied from CRM [id number]. Topic: General - Other >> Dec 24, 2023  9:58 AM Margarette Shawl wrote: Reason for CRM:   Patient is calling in regards to order sent for nebulizer to [facility] 3 weeks ago. She was informed by [facility] today that they are not sure when they will be able to deliver her nebulizer equipment.   Patient is worried, due to urgency by provider to get equipment as soon as possible. She is requesting the order be sent to another DME company if possible, so that she can receive machine sooner.   She is also requesting an order for her oxygen concentrator she uses at night be sent to the new DME company, so that she is receiving all her equipment from one company.  CB#  [phone] >> Dec 25, 2023 10:39 AM Chantha C wrote: Patient's aid Angelia [phone] states patient cannot hardly breath, shortness of breath, and needs the medication and mask for nebulizer. Angelia spoke with nurse Carron Clap today about these issues, and was seen for shortness of breath. Angelia is asking for the medical supplies to be sent to [id number], [place] - [address] [place] [address] Phone: [phone] Fax: [phone]. Please advise and call back.   Spoke with patient regarding prior message.Patient had called [facility] yesterday and added her credit card . [facility] told patient they would deliver the nebulizer machine yesterday and has not heard nothing back.  Pcc's can you help with this order patient has been waiting 3 weeks .

## 2023-12-26 NOTE — Telephone Encounter (Signed)
 Spoke with Dolonda at [facility] is being delivered today. Spoke with daughter and is aware and understanding. NFN

## 2023-12-26 NOTE — Telephone Encounter (Signed)
 Per Dolonda at [facility]- She is reaching out to the logistics team to see if it can be delivered asap. She will email me with an update today she states.

## 2023-12-27 MED ORDER — ALBUTEROL SULFATE (2.5 MG/3ML) 0.083% IN NEBU: 2.5000 mg | INHALATION_SOLUTION | Freq: Four times a day (QID) | RESPIRATORY_TRACT | 5 refills | Status: AC | PRN

## 2023-12-27 NOTE — Telephone Encounter (Signed)
 Copied from CRM [id number]. Topic: Clinical - Prescription Issue >> Dec 27, 2023  9:09 AM Ilene Malling wrote: Reason for CRM: Patient [phone] received the mask and nebulizer yesterday evening from [facility] but no medication solution was provided. The box has no after care or instructions. Patient is asking for nebulizer solution sent today please.  [id number], [place] - [address] [place] [address] Phone: [phone] Fax: [phone]  Spoke with patient regarding prior message . Patient has received her nebulizer and no neb medication.   Tammy can you please advise.

## 2023-12-27 NOTE — Telephone Encounter (Signed)
 Albuterol neb every 6hr As needed  sent to pharm

## 2023-12-27 NOTE — Telephone Encounter (Signed)
 I called and spoke with the pt and notified that the albuterol neb sol was sent  Nothing further needed

## 2024-01-07 DIAGNOSIS — R2681 Unsteadiness on feet: Secondary | ICD-10-CM | POA: Diagnosis not present

## 2024-01-07 DIAGNOSIS — M6281 Muscle weakness (generalized): Secondary | ICD-10-CM | POA: Diagnosis not present

## 2024-01-07 DIAGNOSIS — Z9181 History of falling: Secondary | ICD-10-CM | POA: Diagnosis not present

## 2024-01-07 DIAGNOSIS — R278 Other lack of coordination: Secondary | ICD-10-CM | POA: Diagnosis not present

## 2024-01-09 DIAGNOSIS — R278 Other lack of coordination: Secondary | ICD-10-CM | POA: Diagnosis not present

## 2024-01-09 DIAGNOSIS — H57052 Tonic pupil, left eye: Secondary | ICD-10-CM | POA: Diagnosis not present

## 2024-01-09 DIAGNOSIS — R2681 Unsteadiness on feet: Secondary | ICD-10-CM | POA: Diagnosis not present

## 2024-01-09 DIAGNOSIS — H04123 Dry eye syndrome of bilateral lacrimal glands: Secondary | ICD-10-CM | POA: Diagnosis not present

## 2024-01-09 DIAGNOSIS — H35033 Hypertensive retinopathy, bilateral: Secondary | ICD-10-CM | POA: Diagnosis not present

## 2024-01-09 DIAGNOSIS — Z9181 History of falling: Secondary | ICD-10-CM | POA: Diagnosis not present

## 2024-01-09 DIAGNOSIS — H40013 Open angle with borderline findings, low risk, bilateral: Secondary | ICD-10-CM | POA: Diagnosis not present

## 2024-01-09 DIAGNOSIS — M6281 Muscle weakness (generalized): Secondary | ICD-10-CM | POA: Diagnosis not present

## 2024-01-10 DIAGNOSIS — Z9181 History of falling: Secondary | ICD-10-CM | POA: Diagnosis not present

## 2024-01-10 DIAGNOSIS — R278 Other lack of coordination: Secondary | ICD-10-CM | POA: Diagnosis not present

## 2024-01-10 DIAGNOSIS — M6281 Muscle weakness (generalized): Secondary | ICD-10-CM | POA: Diagnosis not present

## 2024-01-10 DIAGNOSIS — R2681 Unsteadiness on feet: Secondary | ICD-10-CM | POA: Diagnosis not present

## 2024-01-14 ENCOUNTER — Encounter: Payer: Self-pay | Admitting: Family Medicine

## 2024-01-14 DIAGNOSIS — Z9181 History of falling: Secondary | ICD-10-CM | POA: Diagnosis not present

## 2024-01-14 DIAGNOSIS — R2681 Unsteadiness on feet: Secondary | ICD-10-CM | POA: Diagnosis not present

## 2024-01-14 DIAGNOSIS — M6281 Muscle weakness (generalized): Secondary | ICD-10-CM | POA: Diagnosis not present

## 2024-01-14 DIAGNOSIS — R278 Other lack of coordination: Secondary | ICD-10-CM | POA: Diagnosis not present

## 2024-01-16 DIAGNOSIS — R2681 Unsteadiness on feet: Secondary | ICD-10-CM | POA: Diagnosis not present

## 2024-01-16 DIAGNOSIS — M6281 Muscle weakness (generalized): Secondary | ICD-10-CM | POA: Diagnosis not present

## 2024-01-16 DIAGNOSIS — Z9181 History of falling: Secondary | ICD-10-CM | POA: Diagnosis not present

## 2024-01-16 DIAGNOSIS — R278 Other lack of coordination: Secondary | ICD-10-CM | POA: Diagnosis not present

## 2024-01-17 DIAGNOSIS — R2681 Unsteadiness on feet: Secondary | ICD-10-CM | POA: Diagnosis not present

## 2024-01-17 DIAGNOSIS — Z9181 History of falling: Secondary | ICD-10-CM | POA: Diagnosis not present

## 2024-01-17 DIAGNOSIS — R278 Other lack of coordination: Secondary | ICD-10-CM | POA: Diagnosis not present

## 2024-01-17 DIAGNOSIS — M6281 Muscle weakness (generalized): Secondary | ICD-10-CM | POA: Diagnosis not present

## 2024-01-18 ENCOUNTER — Ambulatory Visit: Admitting: Internal Medicine

## 2024-01-21 DIAGNOSIS — R278 Other lack of coordination: Secondary | ICD-10-CM | POA: Diagnosis not present

## 2024-01-21 DIAGNOSIS — Z9181 History of falling: Secondary | ICD-10-CM | POA: Diagnosis not present

## 2024-01-21 DIAGNOSIS — M6281 Muscle weakness (generalized): Secondary | ICD-10-CM | POA: Diagnosis not present

## 2024-01-21 DIAGNOSIS — R2681 Unsteadiness on feet: Secondary | ICD-10-CM | POA: Diagnosis not present

## 2024-01-23 ENCOUNTER — Encounter: Payer: Self-pay | Admitting: Family Medicine

## 2024-01-23 DIAGNOSIS — M6281 Muscle weakness (generalized): Secondary | ICD-10-CM | POA: Diagnosis not present

## 2024-01-23 DIAGNOSIS — Z9181 History of falling: Secondary | ICD-10-CM | POA: Diagnosis not present

## 2024-01-23 DIAGNOSIS — R2681 Unsteadiness on feet: Secondary | ICD-10-CM | POA: Diagnosis not present

## 2024-01-23 DIAGNOSIS — R278 Other lack of coordination: Secondary | ICD-10-CM | POA: Diagnosis not present

## 2024-01-24 ENCOUNTER — Ambulatory Visit: Admitting: Family Medicine

## 2024-01-24 DIAGNOSIS — M6281 Muscle weakness (generalized): Secondary | ICD-10-CM | POA: Diagnosis not present

## 2024-01-24 DIAGNOSIS — Z85828 Personal history of other malignant neoplasm of skin: Secondary | ICD-10-CM | POA: Diagnosis not present

## 2024-01-24 DIAGNOSIS — R2681 Unsteadiness on feet: Secondary | ICD-10-CM | POA: Diagnosis not present

## 2024-01-24 DIAGNOSIS — L578 Other skin changes due to chronic exposure to nonionizing radiation: Secondary | ICD-10-CM | POA: Diagnosis not present

## 2024-01-24 DIAGNOSIS — Z08 Encounter for follow-up examination after completed treatment for malignant neoplasm: Secondary | ICD-10-CM | POA: Diagnosis not present

## 2024-01-24 DIAGNOSIS — R278 Other lack of coordination: Secondary | ICD-10-CM | POA: Diagnosis not present

## 2024-01-24 DIAGNOSIS — Z9181 History of falling: Secondary | ICD-10-CM | POA: Diagnosis not present

## 2024-01-24 DIAGNOSIS — L853 Xerosis cutis: Secondary | ICD-10-CM | POA: Diagnosis not present

## 2024-01-24 DIAGNOSIS — L570 Actinic keratosis: Secondary | ICD-10-CM | POA: Diagnosis not present

## 2024-01-24 DIAGNOSIS — L814 Other melanin hyperpigmentation: Secondary | ICD-10-CM | POA: Diagnosis not present

## 2024-01-24 DIAGNOSIS — L821 Other seborrheic keratosis: Secondary | ICD-10-CM | POA: Diagnosis not present

## 2024-01-24 DIAGNOSIS — D225 Melanocytic nevi of trunk: Secondary | ICD-10-CM | POA: Diagnosis not present

## 2024-01-28 ENCOUNTER — Other Ambulatory Visit: Payer: Self-pay | Admitting: Family Medicine

## 2024-01-28 DIAGNOSIS — R2681 Unsteadiness on feet: Secondary | ICD-10-CM | POA: Diagnosis not present

## 2024-01-28 DIAGNOSIS — M6281 Muscle weakness (generalized): Secondary | ICD-10-CM | POA: Diagnosis not present

## 2024-01-28 DIAGNOSIS — Z9181 History of falling: Secondary | ICD-10-CM | POA: Diagnosis not present

## 2024-01-28 DIAGNOSIS — J449 Chronic obstructive pulmonary disease, unspecified: Secondary | ICD-10-CM

## 2024-01-28 DIAGNOSIS — F411 Generalized anxiety disorder: Secondary | ICD-10-CM

## 2024-01-28 DIAGNOSIS — R278 Other lack of coordination: Secondary | ICD-10-CM | POA: Diagnosis not present

## 2024-01-28 NOTE — Telephone Encounter (Signed)
 Prescription Request  01/28/2024  LOV: 10/26/2023  What is the name of the medication or equipment? pantoprazole  (PROTONIX ) 40 MG tablet   Have you contacted your pharmacy to request a refill? Yes   Which pharmacy would you like this sent to?  [id number], [place] - [address] [place] [address] Phone: [phone] Fax: [phone]    Patient notified that their request is being sent to the clinical staff for review and that they should receive a response within 2 business days.   Please advise at [facility] [phone]

## 2024-01-28 NOTE — Telephone Encounter (Signed)
 Prescription Request  01/28/2024  LOV: 10/26/2023  What is the name of the medication or equipment? FLUoxetine  (PROZAC ) 20 MG tablet   Have you contacted your pharmacy to request a refill? Yes   Which pharmacy would you like this sent to?  [id number], [place] - [address] [place] [address] Phone: [phone] Fax: [phone]    Patient notified that their request is being sent to the clinical staff for review and that they should receive a response within 2 business days.   Please advise at [facility] [phone]

## 2024-01-28 NOTE — Telephone Encounter (Signed)
 Prescription Request  01/28/2024  LOV: 10/26/2023  What is the name of the medication or equipment? Trelegy ellipta  100-62.5-25  Have you contacted your pharmacy to request a refill? Yes   Which pharmacy would you like this sent to?  [id number], [place] - [address] [place]  [address] Phone: [phone] Fax: [phone]    Patient notified that their request is being sent to the clinical staff for review and that they should receive a response within 2 business days.   Please advise at [facility] [phone]

## 2024-01-29 ENCOUNTER — Ambulatory Visit (INDEPENDENT_AMBULATORY_CARE_PROVIDER_SITE_OTHER): Admitting: Family Medicine

## 2024-01-29 ENCOUNTER — Encounter: Payer: Self-pay | Admitting: Family Medicine

## 2024-01-29 VITALS — BP 130/76 | HR 80 | Temp 97.9°F | Ht 66.0 in | Wt 205.0 lb

## 2024-01-29 DIAGNOSIS — I471 Supraventricular tachycardia, unspecified: Secondary | ICD-10-CM

## 2024-01-29 NOTE — Progress Notes (Signed)
 Subjective:    Patient ID: [name], female    DOB: May 22, 1941, 83 y.o.   MRN: [id number] At patient's last visit, there was concern over possible syncope.  I had the patient wear a 14-day ZIO monitor.  Showed frequent PACs 5% of the time occasional PVCs, and 22 episodes of SVT.  We started the patient on Toprol -XL 25 mg daily in an effort to try to help prevent future arrhythmias.  The patient has been doing well however a physical therapist checked her blood pressure and heart rate and told her it was extremely low.  This was 1 time the patient asymptomatic.  She states she has been feeling fine on metoprolol . Past Medical History:  Diagnosis Date   ADD (attention deficit disorder)    Allergic rhinitis    Anemia    Anxiety    Arrhythmia    Arthritis    "hands, neck, right shoulder" (07/28/2015)   Aseptic necrosis (HCC)    Asthma dxc'd 05/2015   Basal cell cancer    Bleeding stomach ulcer ~ 06/2005   COPD (chronic obstructive pulmonary disease) (HCC)    Depression    Diverticula of colon    Dyspnea    Esophagitis    Gastric ulcer with hemorrhage    GERD (gastroesophageal reflux disease)    Heart murmur    History of blood transfusion 06/2005   "related to pneumonia"   History of kidney stones    Hyperlipidemia    Hypertension    Hypothyroidism    Kidney stones    Lung cancer (HCC)    Non small cell lung cancer, adenocarcinoma with BAC 09/2009, VATS converted to wedge resection of left upper lobe mass   Mitral valve prolapse    "no ORs" (07/28/2015)   Osteopenia    Pneumonia 1970's; 06/2005   Sleep apnea dx'd 06/2015   on oxygen  at nite at 2L/[place]    Squamous cell skin cancer    Past Surgical History:  Procedure Laterality Date   ABDOMINAL HYSTERECTOMY  1988   COLONOSCOPY WITH PROPOFOL  N/A 06/15/2017   Procedure: COLONOSCOPY WITH PROPOFOL ;  Surgeon: Alvis Jourdain, MD;  Location: [facility];  Service: Endoscopy;  Laterality: N/A;   HAMMER TOE SURGERY Right ~  2010   SALPINGOOPHORECTOMY Left 1988   SKIN CANCER EXCISION     "I've had severl cut off RLE; left foreqrm, nose under nose"   THORACOTOMY Left 06/2005   THORACOTOMY/LOBECTOMY  09/2009; 11/2009   left; right   Current Outpatient Medications on File Prior to Visit  Medication Sig Dispense Refill   albuterol  (PROVENTIL ) (2.5 MG/3ML) 0.083% nebulizer solution Take 3 mLs (2.5 mg total) by nebulization every 6 (six) hours as needed for wheezing or shortness of breath. 75 mL 5   albuterol  (VENTOLIN  HFA) 108 (90 Base) MCG/ACT inhaler Inhale 1 puff into the lungs every 6 (six) hours as needed for wheezing or shortness of breath. 8 g 2   buPROPion  (WELLBUTRIN  XL) 150 MG 24 hr tablet Take 1 tablet (150 mg total) by mouth daily. 90 tablet 2   CALCIUM  PO Take 1,200 mg by mouth every other day.     Cholecalciferol (VITAMIN D3) 1.25 MG (50000 UT) CAPS Take 50,000 Units by mouth every Sunday.     Cyanocobalamin 1000 MCG CAPS Take 1,000 mcg by mouth daily.     famotidine  (PEPCID ) 20 MG tablet One after supper 30 tablet 11   FLUoxetine  (PROZAC ) 20 MG tablet Take 2 tablets (40 mg  total) by mouth daily. Stop duloxetine  (Patient taking differently: Take 40 mg by mouth daily.) 180 tablet 2   fluticasone  (FLONASE ) 50 MCG/ACT nasal spray Place 2 sprays into both nostrils daily. 48 g 2   Fluticasone -Umeclidin-Vilant (TRELEGY ELLIPTA ) 100-62.5-25 MCG/ACT AEPB Inhale 1 puff into the lungs daily. 1 each 5   levothyroxine  (SYNTHROID ) 125 MCG tablet Take 1 tablet (125 mcg total) by mouth daily. (Patient taking differently: Take 125 mcg by mouth daily before breakfast.) 90 tablet 2   losartan -hydrochlorothiazide  (HYZAAR ) 100-25 MG tablet Take 1 tablet by mouth daily. 90 tablet 2   naproxen sodium (ALEVE) 220 MG tablet Take 220-440 mg by mouth 2 (two) times daily as needed (for pain- TAKE WITH FOOD).     OXYGEN  Inhale 2 L/min into the lungs See admin instructions. Inhale 2 L/min into the lungs at bedtime and during the day as  needed for shortness of breath     pantoprazole  (PROTONIX ) 40 MG tablet Take 1 tablet (40 mg total) by mouth daily. Take 30-60 min before first meal of the day 30 tablet 2   rosuvastatin  (CRESTOR ) 20 MG tablet Take 1 tablet (20 mg total) by mouth daily. 90 tablet 2   traZODone  (DESYREL ) 50 MG tablet Take 1 tablet (50 mg total) by mouth at bedtime. 90 tablet 2   TYLENOL  500 MG tablet Take 500-1,000 mg by mouth every 6 (six) hours as needed (for rib pain).     metoprolol  succinate (TOPROL -XL) 25 MG 24 hr tablet Take 1 tablet (25 mg total) by mouth daily. (Patient not taking: Reported on 01/29/2024) 90 tablet 3   No current facility-administered medications on file prior to visit.    Allergies  Allergen Reactions   Sulfonamide Derivatives Hives   Erythromycin  Nausea And Vomiting   Amoxicillin  Diarrhea, Nausea And Vomiting and Other (See Comments)    Also, it does not work   Doxycycline Nausea Only   Social History   Socioeconomic History   Marital status: Married    Spouse name: Not on file   Number of children: 3   Years of education: Not on file   Highest education level: Not on file  Occupational History   Occupation: retired Academic librarian: RETIRED  Tobacco Use   Smoking status: Former    Current packs/day: 0.00    Average packs/day: 0.8 packs/day for 30.0 years (22.5 ttl pk-yrs)    Types: Cigarettes    Start date: 09/11/1974    Quit date: 09/11/2004    Years since quitting: 19.3   Smokeless tobacco: Never   Tobacco comments:    Pt is vaping x 1.5 years.  Contains nicotine  Vaping Use   Vaping status: Never Used  Substance and Sexual Activity   Alcohol use: Yes    Alcohol/week: 7.0 standard drinks of alcohol    Types: 7 Shots of liquor per week    Comment: 1-2 drinks nightly    Drug use: No   Sexual activity: Yes  Other Topics Concern   Not on file  Social History Narrative   Not on file   Social Drivers of Health   Financial Resource Strain: Low Risk   (08/30/2023)   Overall Financial Resource Strain (CARDIA)    Difficulty of Paying Living Expenses: Not hard at all  Food Insecurity: No Food Insecurity (08/30/2023)   Hunger Vital Sign    Worried About Running Out of Food in the Last Year: Never true    Ran Out of Food  in the Last Year: Never true  Transportation Needs: No Transportation Needs (08/30/2023)   PRAPARE - Administrator, Civil Service (Medical): No    Lack of Transportation (Non-Medical): No  Physical Activity: Inactive (08/30/2023)   Exercise Vital Sign    Days of Exercise per Week: 0 days    Minutes of Exercise per Session: 0 min  Stress: No Stress Concern Present (08/30/2023)   [employer] of Occupational Health - Occupational Stress Questionnaire    Feeling of Stress : Only a little  Social Connections: Socially Isolated (08/30/2023)   Social Connection and Isolation Panel [NHANES]    Frequency of Communication with Friends and Family: More than three times a week    Frequency of Social Gatherings with Friends and Family: Once a week    Attends Religious Services: Never    Database administrator or Organizations: No    Attends Banker Meetings: Never    Marital Status: Widowed  Intimate Partner Violence: Not At Risk (08/30/2023)   Humiliation, Afraid, Rape, and Kick questionnaire    Fear of Current or Ex-Partner: No    Emotionally Abused: No    Physically Abused: No    Sexually Abused: No      Review of Systems  All other systems reviewed and are negative.      Objective:   Physical Exam Vitals reviewed.  Constitutional:      General: She is not in acute distress.    Appearance: Normal appearance. She is normal weight. She is not ill-appearing or toxic-appearing.  Cardiovascular:     Rate and Rhythm: Normal rate and regular rhythm.     Heart sounds: Normal heart sounds.  Pulmonary:     Effort: Pulmonary effort is normal. No respiratory distress.     Breath sounds:  Normal breath sounds. No stridor. No wheezing, rhonchi or rales.  Chest:     Chest wall: No tenderness.  Abdominal:     General: Bowel sounds are normal.     Palpations: Abdomen is soft.  Musculoskeletal:     Right lower leg: No edema.     Left lower leg: No edema.  Neurological:     Mental Status: She is alert.           Assessment & Plan:  SVT (supraventricular tachycardia) (HCC) Given the frequency of the SVT, the PACs, and the PVCs, I have recommended resuming Toprol -XL 25 mg daily.  If the patient has true bradycardia with a heart rate less than 60 or worsening wheezing, we can discontinue the Toprol  but at the present time I feel that it is more beneficial to her.  Also recommend that she wear a humidifier with her oxygen  at night to help avoid nasal irritation

## 2024-01-30 ENCOUNTER — Encounter: Payer: Self-pay | Admitting: Internal Medicine

## 2024-01-30 ENCOUNTER — Other Ambulatory Visit: Payer: Self-pay

## 2024-01-30 ENCOUNTER — Telehealth: Payer: Self-pay

## 2024-01-30 DIAGNOSIS — J449 Chronic obstructive pulmonary disease, unspecified: Secondary | ICD-10-CM

## 2024-01-30 DIAGNOSIS — F411 Generalized anxiety disorder: Secondary | ICD-10-CM

## 2024-01-30 DIAGNOSIS — R278 Other lack of coordination: Secondary | ICD-10-CM | POA: Diagnosis not present

## 2024-01-30 DIAGNOSIS — Z9181 History of falling: Secondary | ICD-10-CM | POA: Diagnosis not present

## 2024-01-30 DIAGNOSIS — R2681 Unsteadiness on feet: Secondary | ICD-10-CM | POA: Diagnosis not present

## 2024-01-30 DIAGNOSIS — M6281 Muscle weakness (generalized): Secondary | ICD-10-CM | POA: Diagnosis not present

## 2024-01-30 DIAGNOSIS — J9611 Chronic respiratory failure with hypoxia: Secondary | ICD-10-CM

## 2024-01-30 MED ORDER — FLUOXETINE HCL 20 MG PO TABS: 40.0000 mg | ORAL_TABLET | Freq: Every day | ORAL | 0 refills | Status: DC

## 2024-01-30 NOTE — Telephone Encounter (Signed)
**Note De-identified  [place] Obfuscation** Please advise 

## 2024-01-30 NOTE — Telephone Encounter (Signed)
 Requested medication (s) are due for refill today: Yes  Requested medication (s) are on the active medication list: Yes  Last refill:  06/14/23  Future visit scheduled: No  Notes to clinic:  Unable to refill due to no refill protocol for this medication.      Requested Prescriptions  Pending Prescriptions Disp Refills   Fluticasone -Umeclidin-Vilant (TRELEGY ELLIPTA ) 100-62.5-25 MCG/ACT AEPB 1 each 5    Sig: Inhale 1 puff into the lungs daily.     Off-Protocol Failed - 01/30/2024 11:57 AM      Failed - Medication not assigned to a protocol, review manually.      Passed - Valid encounter within last 12 months    Recent Outpatient Visits           Yesterday SVT (supraventricular tachycardia) (HCC)   [place] [facility] Cheril Cork, Cisco Crest, MD   3 months ago Syncope, unspecified syncope type   [place] [facility] Cheril Cork, Cisco Crest, MD   3 months ago Acute bronchitis, unspecified organism   [facility] [facility] Amadeo June, MD   4 months ago Recurrent major depressive disorder, in partial remission [facility])   [place] [facility] Austine Lefort, MD   5 months ago Hypothyroidism, unspecified type   [place] [facility] Austine Lefort, MD              Signed Prescriptions Disp Refills   FLUoxetine  (PROZAC ) 20 MG tablet 180 tablet 0    Sig: Take 2 tablets (40 mg total) by mouth daily. Stop duloxetine      Psychiatry:  Antidepressants - SSRI Passed - 01/30/2024 11:57 AM      Passed - Completed PHQ-2 or PHQ-9 in the last 360 days      Passed - Valid encounter within last 6 months    Recent Outpatient Visits           Yesterday SVT (supraventricular tachycardia) (HCC)   [place] [facility] Austine Lefort, MD   3 months ago Syncope, unspecified syncope type   [place] [facility] Medicine Austine Lefort, MD   3 months ago Acute  bronchitis, unspecified organism   [place] [facility] Amadeo June, MD   4 months ago Recurrent major depressive disorder, in partial remission [facility])   [place] [facility] Austine Lefort, MD   5 months ago Hypothyroidism, unspecified type   [place] [facility] Pickard, Cisco Crest, MD              Refused Prescriptions Disp Refills   pantoprazole  (PROTONIX ) 40 MG tablet 30 tablet 2    Sig: Take 1 tablet (40 mg total) by mouth daily. Take 30-60 min before first meal of the day     Gastroenterology: Proton Pump Inhibitors Passed - 01/30/2024 11:57 AM      Passed - Valid encounter within last 12 months    Recent Outpatient Visits           Yesterday SVT (supraventricular tachycardia) [facility])   [place] [facility] Medicine Austine Lefort, MD   3 months ago Syncope, unspecified syncope type   [place] [facility] Medicine Austine Lefort, MD   3 months ago Acute bronchitis, unspecified organism    [facility] Amadeo June, MD   4 months ago Recurrent major depressive disorder,  in partial remission [facility])   [place] [facility] Medicine Pickard, Cisco Crest, MD   5 months ago Hypothyroidism, unspecified type   [place] [facility] Pickard, Cisco Crest, MD

## 2024-01-30 NOTE — Telephone Encounter (Signed)
 Prescription Request  01/30/2024  LOV: Visit date not found  What is the name of the medication or equipment?   FLUoxetine  (PROZAC ) 20 MG tablet    Have you contacted your pharmacy to request a refill? Yes   Which pharmacy would you like this sent to?  [id number]le, [place] - [address] [place] [address] Phone: [phone] Fax: [phone]    Patient notified that their request is being sent to the clinical staff for review and that they should receive a response within 2 business days.   Please advise at Mobile [phone] (mobile)

## 2024-01-30 NOTE — Telephone Encounter (Signed)
 Prescription Request  01/30/2024  LOV: Visit date not found  What is the name of the medication or equipment? Fluticasone -Umeclidin-Vilant (TRELEGY ELLIPTA ) 100-62.5-25 MCG/ACT AEPB   Have you contacted your pharmacy to request a refill? Yes   Which pharmacy would you like this sent to?  [id number]le, [place] - [address]  [place] [address] Phone: [phone] Fax: [phone]    Patient notified that their request is being sent to the clinical staff for review and that they should receive a response within 2 business days.   Please advise at Mobile [phone] (mobile)

## 2024-01-30 NOTE — Telephone Encounter (Signed)
 Requested on 01/28/24 in a separate encounter, refilled fluoxetine  on 01/30/24.

## 2024-01-31 DIAGNOSIS — R278 Other lack of coordination: Secondary | ICD-10-CM | POA: Diagnosis not present

## 2024-01-31 DIAGNOSIS — Z9181 History of falling: Secondary | ICD-10-CM | POA: Diagnosis not present

## 2024-01-31 DIAGNOSIS — M6281 Muscle weakness (generalized): Secondary | ICD-10-CM | POA: Diagnosis not present

## 2024-01-31 DIAGNOSIS — R2681 Unsteadiness on feet: Secondary | ICD-10-CM | POA: Diagnosis not present

## 2024-01-31 MED ORDER — TRELEGY ELLIPTA 100-62.5-25 MCG/ACT IN AEPB: 1.0000 | INHALATION_SPRAY | Freq: Every day | RESPIRATORY_TRACT | 5 refills | Status: DC

## 2024-01-31 NOTE — Telephone Encounter (Signed)
 Order for humidity placed, patient aware.

## 2024-02-01 ENCOUNTER — Telehealth: Payer: Self-pay

## 2024-02-01 NOTE — Telephone Encounter (Signed)
 Prescription Request  02/01/2024  LOV: 01/30/24  What is the name of the medication or equipment? pantoprazole  (PROTONIX ) 40 MG tablet [[id number]]   Have you contacted your pharmacy to request a refill? Yes   Which pharmacy would you like this sent to?  [id number], [place] - [address] [place] [address] Phone: [phone] Fax: [phone]    Patient notified that their request is being sent to the clinical staff for review and that they should receive a response within 2 business days.   Please advise at [facility] [phone]

## 2024-02-01 NOTE — Telephone Encounter (Signed)
 Refill sent on 12/20/23 #30/2 refills, too soon to refill.

## 2024-02-04 DIAGNOSIS — M6281 Muscle weakness (generalized): Secondary | ICD-10-CM | POA: Diagnosis not present

## 2024-02-04 DIAGNOSIS — Z9181 History of falling: Secondary | ICD-10-CM | POA: Diagnosis not present

## 2024-02-04 DIAGNOSIS — R278 Other lack of coordination: Secondary | ICD-10-CM | POA: Diagnosis not present

## 2024-02-04 DIAGNOSIS — R2681 Unsteadiness on feet: Secondary | ICD-10-CM | POA: Diagnosis not present

## 2024-02-06 DIAGNOSIS — R278 Other lack of coordination: Secondary | ICD-10-CM | POA: Diagnosis not present

## 2024-02-06 DIAGNOSIS — Z9181 History of falling: Secondary | ICD-10-CM | POA: Diagnosis not present

## 2024-02-06 DIAGNOSIS — R2681 Unsteadiness on feet: Secondary | ICD-10-CM | POA: Diagnosis not present

## 2024-02-06 DIAGNOSIS — M6281 Muscle weakness (generalized): Secondary | ICD-10-CM | POA: Diagnosis not present

## 2024-02-08 DIAGNOSIS — R2681 Unsteadiness on feet: Secondary | ICD-10-CM | POA: Diagnosis not present

## 2024-02-08 DIAGNOSIS — M6281 Muscle weakness (generalized): Secondary | ICD-10-CM | POA: Diagnosis not present

## 2024-02-08 DIAGNOSIS — Z9181 History of falling: Secondary | ICD-10-CM | POA: Diagnosis not present

## 2024-02-08 DIAGNOSIS — R278 Other lack of coordination: Secondary | ICD-10-CM | POA: Diagnosis not present

## 2024-02-11 DIAGNOSIS — R2681 Unsteadiness on feet: Secondary | ICD-10-CM | POA: Diagnosis not present

## 2024-02-11 DIAGNOSIS — M6281 Muscle weakness (generalized): Secondary | ICD-10-CM | POA: Diagnosis not present

## 2024-02-11 DIAGNOSIS — Z9181 History of falling: Secondary | ICD-10-CM | POA: Diagnosis not present

## 2024-02-11 DIAGNOSIS — R278 Other lack of coordination: Secondary | ICD-10-CM | POA: Diagnosis not present

## 2024-02-13 DIAGNOSIS — M6281 Muscle weakness (generalized): Secondary | ICD-10-CM | POA: Diagnosis not present

## 2024-02-13 DIAGNOSIS — Z9181 History of falling: Secondary | ICD-10-CM | POA: Diagnosis not present

## 2024-02-13 DIAGNOSIS — R2681 Unsteadiness on feet: Secondary | ICD-10-CM | POA: Diagnosis not present

## 2024-02-13 DIAGNOSIS — R278 Other lack of coordination: Secondary | ICD-10-CM | POA: Diagnosis not present

## 2024-02-15 DIAGNOSIS — R278 Other lack of coordination: Secondary | ICD-10-CM | POA: Diagnosis not present

## 2024-02-15 DIAGNOSIS — R2681 Unsteadiness on feet: Secondary | ICD-10-CM | POA: Diagnosis not present

## 2024-02-15 DIAGNOSIS — Z9181 History of falling: Secondary | ICD-10-CM | POA: Diagnosis not present

## 2024-02-15 DIAGNOSIS — M6281 Muscle weakness (generalized): Secondary | ICD-10-CM | POA: Diagnosis not present

## 2024-02-18 DIAGNOSIS — M6281 Muscle weakness (generalized): Secondary | ICD-10-CM | POA: Diagnosis not present

## 2024-02-18 DIAGNOSIS — R278 Other lack of coordination: Secondary | ICD-10-CM | POA: Diagnosis not present

## 2024-02-18 DIAGNOSIS — Z9181 History of falling: Secondary | ICD-10-CM | POA: Diagnosis not present

## 2024-02-18 DIAGNOSIS — R2681 Unsteadiness on feet: Secondary | ICD-10-CM | POA: Diagnosis not present

## 2024-02-20 DIAGNOSIS — R278 Other lack of coordination: Secondary | ICD-10-CM | POA: Diagnosis not present

## 2024-02-20 DIAGNOSIS — Z9181 History of falling: Secondary | ICD-10-CM | POA: Diagnosis not present

## 2024-02-20 DIAGNOSIS — R2681 Unsteadiness on feet: Secondary | ICD-10-CM | POA: Diagnosis not present

## 2024-02-20 DIAGNOSIS — M6281 Muscle weakness (generalized): Secondary | ICD-10-CM | POA: Diagnosis not present

## 2024-02-21 DIAGNOSIS — R278 Other lack of coordination: Secondary | ICD-10-CM | POA: Diagnosis not present

## 2024-02-21 DIAGNOSIS — M6281 Muscle weakness (generalized): Secondary | ICD-10-CM | POA: Diagnosis not present

## 2024-02-21 DIAGNOSIS — Z9181 History of falling: Secondary | ICD-10-CM | POA: Diagnosis not present

## 2024-02-21 DIAGNOSIS — R2681 Unsteadiness on feet: Secondary | ICD-10-CM | POA: Diagnosis not present

## 2024-02-26 DIAGNOSIS — R2681 Unsteadiness on feet: Secondary | ICD-10-CM | POA: Diagnosis not present

## 2024-02-26 DIAGNOSIS — M6281 Muscle weakness (generalized): Secondary | ICD-10-CM | POA: Diagnosis not present

## 2024-02-26 DIAGNOSIS — R278 Other lack of coordination: Secondary | ICD-10-CM | POA: Diagnosis not present

## 2024-02-26 DIAGNOSIS — Z9181 History of falling: Secondary | ICD-10-CM | POA: Diagnosis not present

## 2024-02-27 DIAGNOSIS — R278 Other lack of coordination: Secondary | ICD-10-CM | POA: Diagnosis not present

## 2024-02-27 DIAGNOSIS — Z9181 History of falling: Secondary | ICD-10-CM | POA: Diagnosis not present

## 2024-02-27 DIAGNOSIS — M6281 Muscle weakness (generalized): Secondary | ICD-10-CM | POA: Diagnosis not present

## 2024-02-27 DIAGNOSIS — R2681 Unsteadiness on feet: Secondary | ICD-10-CM | POA: Diagnosis not present

## 2024-02-28 ENCOUNTER — Encounter: Payer: Self-pay | Admitting: Family Medicine

## 2024-02-28 DIAGNOSIS — R278 Other lack of coordination: Secondary | ICD-10-CM | POA: Diagnosis not present

## 2024-02-28 DIAGNOSIS — M6281 Muscle weakness (generalized): Secondary | ICD-10-CM | POA: Diagnosis not present

## 2024-02-28 DIAGNOSIS — Z9181 History of falling: Secondary | ICD-10-CM | POA: Diagnosis not present

## 2024-02-28 DIAGNOSIS — R2681 Unsteadiness on feet: Secondary | ICD-10-CM | POA: Diagnosis not present

## 2024-03-03 DIAGNOSIS — R2681 Unsteadiness on feet: Secondary | ICD-10-CM | POA: Diagnosis not present

## 2024-03-03 DIAGNOSIS — M6281 Muscle weakness (generalized): Secondary | ICD-10-CM | POA: Diagnosis not present

## 2024-03-03 DIAGNOSIS — Z9181 History of falling: Secondary | ICD-10-CM | POA: Diagnosis not present

## 2024-03-03 DIAGNOSIS — R278 Other lack of coordination: Secondary | ICD-10-CM | POA: Diagnosis not present

## 2024-03-05 DIAGNOSIS — R278 Other lack of coordination: Secondary | ICD-10-CM | POA: Diagnosis not present

## 2024-03-05 DIAGNOSIS — M6281 Muscle weakness (generalized): Secondary | ICD-10-CM | POA: Diagnosis not present

## 2024-03-05 DIAGNOSIS — Z9181 History of falling: Secondary | ICD-10-CM | POA: Diagnosis not present

## 2024-03-05 DIAGNOSIS — R2681 Unsteadiness on feet: Secondary | ICD-10-CM | POA: Diagnosis not present

## 2024-03-10 ENCOUNTER — Telehealth: Payer: Self-pay

## 2024-03-10 ENCOUNTER — Other Ambulatory Visit: Payer: Self-pay

## 2024-03-10 ENCOUNTER — Other Ambulatory Visit: Payer: Self-pay | Admitting: Family Medicine

## 2024-03-10 DIAGNOSIS — E039 Hypothyroidism, unspecified: Secondary | ICD-10-CM

## 2024-03-10 DIAGNOSIS — R2681 Unsteadiness on feet: Secondary | ICD-10-CM | POA: Diagnosis not present

## 2024-03-10 DIAGNOSIS — R278 Other lack of coordination: Secondary | ICD-10-CM | POA: Diagnosis not present

## 2024-03-10 DIAGNOSIS — Z9181 History of falling: Secondary | ICD-10-CM | POA: Diagnosis not present

## 2024-03-10 DIAGNOSIS — M6281 Muscle weakness (generalized): Secondary | ICD-10-CM | POA: Diagnosis not present

## 2024-03-10 MED ORDER — LEVOTHYROXINE SODIUM 125 MCG PO TABS
125.0000 ug | ORAL_TABLET | Freq: Every day | ORAL | 0 refills | Status: DC
Start: 2024-03-10 — End: 2024-06-03

## 2024-03-10 NOTE — Telephone Encounter (Signed)
 Prescription Request  03/10/2024  LOV: 01/14/24  What is the name of the medication or equipment? levothyroxine  (SYNTHROID ) 125 MCG tablet [[id number]]   Have you contacted your pharmacy to request a refill? Yes   Which pharmacy would you like this sent to?  [id number] [place], [place] - [address] [place] [address] Phone: [phone] Fax: [phone]    Patient notified that their request is being sent to the clinical staff for review and that they should receive a response within 2 business days.   Please advise at [facility] [phone]

## 2024-03-10 NOTE — Telephone Encounter (Signed)
 Sent in medication

## 2024-03-13 DIAGNOSIS — R2681 Unsteadiness on feet: Secondary | ICD-10-CM | POA: Diagnosis not present

## 2024-03-13 DIAGNOSIS — Z9181 History of falling: Secondary | ICD-10-CM | POA: Diagnosis not present

## 2024-03-13 DIAGNOSIS — M6281 Muscle weakness (generalized): Secondary | ICD-10-CM | POA: Diagnosis not present

## 2024-03-13 DIAGNOSIS — R278 Other lack of coordination: Secondary | ICD-10-CM | POA: Diagnosis not present

## 2024-03-14 DIAGNOSIS — R2681 Unsteadiness on feet: Secondary | ICD-10-CM | POA: Diagnosis not present

## 2024-03-14 DIAGNOSIS — R278 Other lack of coordination: Secondary | ICD-10-CM | POA: Diagnosis not present

## 2024-03-14 DIAGNOSIS — Z9181 History of falling: Secondary | ICD-10-CM | POA: Diagnosis not present

## 2024-03-14 DIAGNOSIS — M6281 Muscle weakness (generalized): Secondary | ICD-10-CM | POA: Diagnosis not present

## 2024-03-18 NOTE — Progress Notes (Deleted)
 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 She has seen Dr. Shelah in the past- last in 2012. She has been seeing Dr. Petra at [facility] and is here today to re-est care. She c/o DOE with cleaning her house or taking out the trash. She is on O2 with sleep at 2lpm. She uses proiar 1 x per wk on average and has a neb with albuterol  that she rarely uses.   doe x [facility] = can't walk a nl pace on a flat grade s sob but does fine slow and flat eg shopping at slow pace / can't do ramps Doe Never changed since empyema despite having RT and chemo for BAC dx 09/14/09 at [facility] with [facility] 11/2009 and persistent bilateral MPN's under surveillance at [facility] but no recent bx's or further chemo.  Uses  advair 250 twice daily ? Benefit > saba doesn't really help any of her symptoms/ no signifcant chronic or assoc  Coughing. rec Plan A = Automatic = bevespi  Take 2 puffs first thing in am and then another 2 puffs about 12 hours later.  Work on inhaler technique: Plan B = Backup Only use your albuterol  as a rescue medication  Plan C = Crisis - only use your albuterol  nebulizer if you first try Plan B and it fails to help > ok to use the nebulizer up to every 4 hours but if start needing it regularly call for immediate appointment     07/28/2022  f/u ov/[name] re: GOLD 2 copd/ 02 3lpm hs/ POC during the day maint on trelegy   Chief Complaint  Patient  presents with   Follow-up    Dyspnea and SOB worse.  Oxygen  qhs and prn during day.   Dyspnea:  room to room no longer checking sats  Cough: slt greenish/ assoc watery rhinitis  Sleeping: flat bed 2 pillows no resp cc  SABA use: twice a week  02: not needing sitting/ 3lpm walking  Rec Make sure you check your oxygen  saturation  AT  your highest level of activity (not after you stop)   to be sure it stays over 90%   Also  Ok to try albuterol  15 min before an activity (on alternating days)  that you know would usually make you short of breath   11/23/2023  f/u ov/[name] re: GOLD 2 copd /02 dep   maint on Trelegy   Chief Complaint  Patient presents with   Follow-up  Dyspnea:  doing PT but nothing aerobic/ uses 02 2lpm but not checking sats with ex or at rest  Cough: some in am rattle slt yellow > 1 y assoc with nasal congestion rx zyrtec/ flonase   Sleeping: electric bed minimally elevated 2 pillows s  resp cc  SABA use: none  02: 2lpm hs   Rec Continue trelegy/ monitor sats at  peak ex   12/12/23 ER eval BNP 227 / troponin nl / cxr no acute changes    12/20/2023  post ER f/u ov/[name] office/[name] re: GOLD 2 copd  maint on trelegy x pred from ER  Chief Complaint  Patient presents with   Shortness of Breath   Dyspnea:  worse x 2 months, 2 trips to ER > no better  Cough: no change from usual  Sleeping: 30 degrees electric or gets sob  nightly  SABA use: no better  02: 3lpm  Rec Pantoprazole  (protonix ) 40 mg   Take  30-60 min before first meal of the day and Pepcid  (famotidine )  20 mg after supper until return to office - this is the best way to tell whether stomach acid is contributing to your problem.   GERD diet reviewed, bed blocks rec  Try sleeping in recliner at 60 degrees  Make sure you check your oxygen  saturation  AT  your highest level of activity (not after you stop)   to be sure it stays over 90% Please schedule a follow up office visit in 4 weeks, sooner if needed  with  all medications /inhalers/ solutions in hand    03/20/2024  f/u ov/[name] re: ***   maint on *** did *** bring meds No chief complaint on file.   Dyspnea:  *** Cough: *** Sleeping: *** resp cc  SABA use: *** 02: ***  Lung cancer screening :  ***    No obvious day to day or daytime variability or assoc excess/ purulent sputum or mucus plugs or hemoptysis or cp or chest tightness, subjective wheeze or overt sinus or hb symptoms.    Also denies any obvious fluctuation of symptoms with weather or environmental changes or other aggravating or alleviating factors except as outlined above   No unusual exposure hx or h/o childhood pna/ asthma or knowledge of premature birth.  Current Allergies, Complete Past Medical History, Past Surgical History, Family History, and Social History were reviewed in [employer] record.  ROS  The following are not active complaints unless bolded Hoarseness, sore throat, dysphagia, dental problems, itching, sneezing,  nasal congestion or discharge of excess mucus or purulent secretions, ear ache,   fever, chills, sweats, unintended wt loss or wt gain, classically pleuritic or exertional cp,  orthopnea pnd or arm/hand swelling  or leg swelling, presyncope, palpitations, abdominal pain, anorexia, nausea, vomiting, diarrhea  or change in bowel habits or change in bladder habits, change in stools or change in urine, dysuria, hematuria,  rash, arthralgias, visual complaints, headache, numbness, weakness or ataxia or problems with walking or coordination,  change in mood or  memory.        No outpatient medications have been marked as taking for the 03/20/24 encounter (Appointment) with Darlean Ozell NOVAK, MD.             Objective:   Physical Exam  03/20/2024         ***  12/20/2023        204   11/23/2023         201  07/28/2022        196  07/06/2020     183  04/09/2019       173  03/10/2019       179 02/05/2019       179  07/19/2018       173   06/19/2018       175  08/10/2017  170   06/29/17 166 lb (75.3 kg)  05/28/17 164 lb (74.4 kg)  05/03/17 166 lb (75.3 kg)    Vital signs reviewed  03/20/2024  - Note at rest 02 sats  ***% on ***   General appearance:    ***   Min bar***     Assessment:

## 2024-03-19 DIAGNOSIS — R2681 Unsteadiness on feet: Secondary | ICD-10-CM | POA: Diagnosis not present

## 2024-03-19 DIAGNOSIS — Z9181 History of falling: Secondary | ICD-10-CM | POA: Diagnosis not present

## 2024-03-19 DIAGNOSIS — M6281 Muscle weakness (generalized): Secondary | ICD-10-CM | POA: Diagnosis not present

## 2024-03-19 DIAGNOSIS — R278 Other lack of coordination: Secondary | ICD-10-CM | POA: Diagnosis not present

## 2024-03-20 ENCOUNTER — Ambulatory Visit: Admitting: Internal Medicine

## 2024-03-21 DIAGNOSIS — R278 Other lack of coordination: Secondary | ICD-10-CM | POA: Diagnosis not present

## 2024-03-21 DIAGNOSIS — R2681 Unsteadiness on feet: Secondary | ICD-10-CM | POA: Diagnosis not present

## 2024-03-21 DIAGNOSIS — Z9181 History of falling: Secondary | ICD-10-CM | POA: Diagnosis not present

## 2024-03-21 DIAGNOSIS — M6281 Muscle weakness (generalized): Secondary | ICD-10-CM | POA: Diagnosis not present

## 2024-03-24 DIAGNOSIS — R2681 Unsteadiness on feet: Secondary | ICD-10-CM | POA: Diagnosis not present

## 2024-03-24 DIAGNOSIS — M6281 Muscle weakness (generalized): Secondary | ICD-10-CM | POA: Diagnosis not present

## 2024-03-24 DIAGNOSIS — R278 Other lack of coordination: Secondary | ICD-10-CM | POA: Diagnosis not present

## 2024-03-24 DIAGNOSIS — Z9181 History of falling: Secondary | ICD-10-CM | POA: Diagnosis not present

## 2024-03-27 DIAGNOSIS — M6281 Muscle weakness (generalized): Secondary | ICD-10-CM | POA: Diagnosis not present

## 2024-03-27 DIAGNOSIS — Z9181 History of falling: Secondary | ICD-10-CM | POA: Diagnosis not present

## 2024-03-27 DIAGNOSIS — R2681 Unsteadiness on feet: Secondary | ICD-10-CM | POA: Diagnosis not present

## 2024-03-27 DIAGNOSIS — R278 Other lack of coordination: Secondary | ICD-10-CM | POA: Diagnosis not present

## 2024-03-28 DIAGNOSIS — M6281 Muscle weakness (generalized): Secondary | ICD-10-CM | POA: Diagnosis not present

## 2024-03-28 DIAGNOSIS — Z9181 History of falling: Secondary | ICD-10-CM | POA: Diagnosis not present

## 2024-03-28 DIAGNOSIS — R278 Other lack of coordination: Secondary | ICD-10-CM | POA: Diagnosis not present

## 2024-03-28 DIAGNOSIS — R2681 Unsteadiness on feet: Secondary | ICD-10-CM | POA: Diagnosis not present

## 2024-03-31 DIAGNOSIS — Z9181 History of falling: Secondary | ICD-10-CM | POA: Diagnosis not present

## 2024-03-31 DIAGNOSIS — R278 Other lack of coordination: Secondary | ICD-10-CM | POA: Diagnosis not present

## 2024-03-31 DIAGNOSIS — R2681 Unsteadiness on feet: Secondary | ICD-10-CM | POA: Diagnosis not present

## 2024-03-31 DIAGNOSIS — M6281 Muscle weakness (generalized): Secondary | ICD-10-CM | POA: Diagnosis not present

## 2024-04-02 DIAGNOSIS — Z9181 History of falling: Secondary | ICD-10-CM | POA: Diagnosis not present

## 2024-04-02 DIAGNOSIS — R278 Other lack of coordination: Secondary | ICD-10-CM | POA: Diagnosis not present

## 2024-04-02 DIAGNOSIS — R2681 Unsteadiness on feet: Secondary | ICD-10-CM | POA: Diagnosis not present

## 2024-04-02 DIAGNOSIS — M6281 Muscle weakness (generalized): Secondary | ICD-10-CM | POA: Diagnosis not present

## 2024-04-03 DIAGNOSIS — Z9181 History of falling: Secondary | ICD-10-CM | POA: Diagnosis not present

## 2024-04-03 DIAGNOSIS — R2681 Unsteadiness on feet: Secondary | ICD-10-CM | POA: Diagnosis not present

## 2024-04-03 DIAGNOSIS — R278 Other lack of coordination: Secondary | ICD-10-CM | POA: Diagnosis not present

## 2024-04-03 DIAGNOSIS — M6281 Muscle weakness (generalized): Secondary | ICD-10-CM | POA: Diagnosis not present

## 2024-04-09 DIAGNOSIS — Z9181 History of falling: Secondary | ICD-10-CM | POA: Diagnosis not present

## 2024-04-09 DIAGNOSIS — M6281 Muscle weakness (generalized): Secondary | ICD-10-CM | POA: Diagnosis not present

## 2024-04-09 DIAGNOSIS — R278 Other lack of coordination: Secondary | ICD-10-CM | POA: Diagnosis not present

## 2024-04-09 DIAGNOSIS — R2681 Unsteadiness on feet: Secondary | ICD-10-CM | POA: Diagnosis not present

## 2024-04-11 DIAGNOSIS — R278 Other lack of coordination: Secondary | ICD-10-CM | POA: Diagnosis not present

## 2024-04-11 DIAGNOSIS — R2681 Unsteadiness on feet: Secondary | ICD-10-CM | POA: Diagnosis not present

## 2024-04-11 DIAGNOSIS — M6281 Muscle weakness (generalized): Secondary | ICD-10-CM | POA: Diagnosis not present

## 2024-04-11 DIAGNOSIS — Z9181 History of falling: Secondary | ICD-10-CM | POA: Diagnosis not present

## 2024-04-14 DIAGNOSIS — R2681 Unsteadiness on feet: Secondary | ICD-10-CM | POA: Diagnosis not present

## 2024-04-14 DIAGNOSIS — Z9181 History of falling: Secondary | ICD-10-CM | POA: Diagnosis not present

## 2024-04-14 DIAGNOSIS — R278 Other lack of coordination: Secondary | ICD-10-CM | POA: Diagnosis not present

## 2024-04-14 DIAGNOSIS — M6281 Muscle weakness (generalized): Secondary | ICD-10-CM | POA: Diagnosis not present

## 2024-04-17 DIAGNOSIS — R278 Other lack of coordination: Secondary | ICD-10-CM | POA: Diagnosis not present

## 2024-04-17 DIAGNOSIS — M6281 Muscle weakness (generalized): Secondary | ICD-10-CM | POA: Diagnosis not present

## 2024-04-17 DIAGNOSIS — Z9181 History of falling: Secondary | ICD-10-CM | POA: Diagnosis not present

## 2024-04-17 DIAGNOSIS — R2681 Unsteadiness on feet: Secondary | ICD-10-CM | POA: Diagnosis not present

## 2024-04-18 DIAGNOSIS — R278 Other lack of coordination: Secondary | ICD-10-CM | POA: Diagnosis not present

## 2024-04-18 DIAGNOSIS — R2681 Unsteadiness on feet: Secondary | ICD-10-CM | POA: Diagnosis not present

## 2024-04-18 DIAGNOSIS — Z9181 History of falling: Secondary | ICD-10-CM | POA: Diagnosis not present

## 2024-04-18 DIAGNOSIS — M6281 Muscle weakness (generalized): Secondary | ICD-10-CM | POA: Diagnosis not present

## 2024-04-20 NOTE — Progress Notes (Addendum)
 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 She has seen Dr. Shelah in the past- last in 2012. She has been seeing Dr. Petra at [facility] and is here today to re-est care. She c/o DOE with cleaning her house or taking out the trash. She is on O2 with sleep at 2lpm. She uses proiar 1 x per wk on average and has a neb with albuterol  that she rarely uses.   doe x [facility] = can't walk a nl pace on a flat grade s sob but does fine slow and flat eg shopping at slow pace / can't do ramps Doe Never changed since empyema despite having RT and chemo for BAC dx 09/14/09 at [facility] with [facility] 11/2009 and persistent bilateral MPN's under surveillance at [facility] but no recent bx's or further chemo. Uses  advair 250 twice daily ? Benefit > saba doesn't really help any of her symptoms/ no signifcant chronic or assoc  Coughing. rec Plan A = Automatic = bevespi  Take 2 puffs first thing in am and then another 2 puffs about 12 hours later.  Work on inhaler technique: Plan B = Backup Only use your albuterol  as a rescue medication  Plan C = Crisis - only use your albuterol  nebulizer if you first try Plan B and it fails to help > ok to use the nebulizer up to every 4 hours but if start needing it regularly call for immediate appointment    12/12/23 ER eval BNP 227 / troponin nl / cxr no acute changes    12/20/2023  post ER f/u ov/[place] office/Shatori Bertucci re:  GOLD 2 copd  maint on trelegy x pred from ER  Chief Complaint  Patient presents with   Shortness of Breath   Dyspnea:  worse x 2 months, 2 trips to ER > no better  Cough: no change from usual  Sleeping: 30 degrees electric or gets sob  nightly  SABA use: no better  02: 3lpm  Rec Pantoprazole  (protonix ) 40 mg   Take  30-60 min before first meal of the day and Pepcid  (famotidine )  20 mg after supper until return to office  GERD diet reviewed, bed blocks rec  Try sleeping in recliner at 60 degrees  Make sure you check your oxygen  saturation  AT  your highest level of activity (not after you stop)   to be sure it stays over 90%  Please schedule a follow up office visit in 4 weeks, sooner if needed  with all medications /inhalers/ solutions in hand     04/21/2024  f/u ov/ office/Joeseph [place] re: GOLD 2 copd 02 dep maint on Trelegy  did not  bring meds pepcid  p supper - off PPI x one month Chief Complaint  Patient presents with   COPD    4 motnh f/u - shob in the morning  Dyspnea:  home PT three times per week with 2lpm  Cough: slt yellow mostly in am on wakening then clears Sleeping: bed now flat and wose    resp cc  SABA use: hfa and neb not needing either  02: 2lpm conc/    No obvious day to day or daytime variability or assoc   mucus plugs or hemoptysis or cp or chest tightness, subjective wheeze or overt   hb symptoms.    Also denies any obvious fluctuation of symptoms with weather or environmental changes or other aggravating or alleviating factors except as outlined above   No unusual exposure hx or h/o childhood pna/ asthma or knowledge of premature birth.  Current Allergies, Complete Past Medical History, Past Surgical History, Family History, and Social History were reviewed in [employer] record.  ROS  The following are not active complaints unless bolded Hoarseness, sore throat, dysphagia, dental problems, itching, sneezing,  nasal congestion or  discharge of excess mucus or purulent secretions, ear ache,   fever, chills, sweats, unintended wt loss or wt gain, classically pleuritic or exertional cp,  orthopnea pnd or arm/hand swelling  or leg swelling, presyncope, palpitations, abdominal pain, anorexia, nausea, vomiting, diarrhea  or change in bowel habits or change in bladder habits, change in stools or change in urine, dysuria, hematuria,  rash, arthralgias, visual complaints, headache, numbness, weakness or ataxia or problems with walking or coordination,  change in mood or  memory.        Current Meds  Medication Sig   albuterol  (PROVENTIL ) (2.5 MG/3ML) 0.083% nebulizer solution Take 3 mLs (2.5 mg total) by nebulization every 6 (six) hours as needed for wheezing or shortness of breath.   albuterol  (VENTOLIN  HFA) 108 (90 Base) MCG/ACT inhaler Inhale 1 puff into the lungs every 6 (six) hours as needed for wheezing or shortness of breath.   buPROPion  (WELLBUTRIN  XL) 150 MG 24 hr tablet Take 1 tablet (150 mg total) by mouth daily.   CALCIUM  PO Take 1,200 mg by mouth every other day.   Cholecalciferol (VITAMIN D3) 1.25 MG (50000 UT) CAPS Take 50,000 Units by mouth every Sunday.   Cyanocobalamin 1000 MCG CAPS Take 1,000 mcg by mouth daily.   famotidine  (PEPCID ) 20 MG tablet One after supper   FLUoxetine  (PROZAC ) 20 MG tablet Take 2 tablets (40 mg total) by mouth daily. Stop duloxetine    fluticasone  (FLONASE ) 50 MCG/ACT nasal spray Place 2 sprays into both nostrils daily.   Fluticasone -Umeclidin-Vilant (TRELEGY ELLIPTA ) 100-62.5-25 MCG/ACT AEPB Inhale 1 puff into the lungs daily.   levothyroxine  (SYNTHROID ) 125 MCG tablet Take 1 tablet (125 mcg total) by mouth daily before breakfast.   losartan -hydrochlorothiazide  (HYZAAR ) 100-25 MG tablet Take 1 tablet by mouth daily.   metoprolol  succinate (TOPROL -XL) 25 MG 24 hr tablet Take 1 tablet (25 mg total) by mouth daily.   naproxen sodium (ALEVE) 220 MG tablet Take 220-440 mg by mouth 2 (two) times  daily as needed (for pain- TAKE WITH FOOD).   OXYGEN  Inhale 2 L/min into the lungs See admin instructions. Inhale 2 L/min into the lungs at bedtime and during the day as needed for shortness of breath   pantoprazole  (PROTONIX ) 40 MG tablet TAKE 1 TABLET (40 MG TOTAL) BY MOUTH DAILY. TAKE 30-60 MIN BEFORE FIRST MEAL OF THE DAY   rosuvastatin  (CRESTOR ) 20 MG tablet Take 1 tablet (20 mg total) by mouth daily.   traZODone  (DESYREL ) 50 MG tablet Take 1 tablet (50 mg total) by mouth at  bedtime.   TYLENOL  500 MG tablet Take 500-1,000 mg by mouth every 6 (six) hours as needed (for rib pain).               Objective:   Physical Exam  Wts  04/21/2024        208  12/20/2023        204   11/23/2023         201  07/28/2022        196  07/06/2020     183  04/09/2019       173  03/10/2019       179 02/05/2019       179  07/19/2018       173  06/19/2018       175  08/10/2017     170   06/29/17 166 lb (75.3 kg)  05/28/17 164 lb (74.4 kg)  05/03/17 166 lb (75.3 kg)    Vital signs reviewed  04/21/2024  - Note at rest 02 sats  93% on 2lp  POC    General appearance:    am pleasant wf walks with cane   HEENT : Oropharynx  clear   Nasal turbinates nl    NECK :  without  apparent JVD/ palpable Nodes/TM    LUNGS: no acc muscle use,  Min barrel  contour chest wall with bilateral  slightly decreased bs s audible wheeze and  without cough on insp or exp maneuvers and min  Hyperresonant  to  percussion bilaterally    CV:  RRR  no s3 or murmur or increase in P2, and no edema   ABD:  soft and nontender with pos end  insp Hoover's  in the supine position.  No bruits or organomegaly appreciated   MS:  Nl gait/ ext warm without deformities Or obvious joint restrictions  calf tenderness, cyanosis or clubbing    SKIN: warm and dry without lesions    NEURO:  alert, approp, nl sensorium with  no motor or cerebellar deficits apparent.             Assessment:       Assessment & Plan COPD mixed type  (HCC) Quit smoking 2006 PFT's  08/07/11  FEV1 2.14 (89 % ) ratio 69  with DLCO  73 % corrects to 108 % for alv volume  - Spirometry 06/29/2017  FEV1 1.24 (52%)  Ratio 64 on advair -  Allergy  profile 06/29/17  >  Eos 0.2/  IgE  13 RAST neg  - 06/29/2017  try bevespi   2bid > improved as of 08/10/18  -  Spirometry 06/19/2018  FEV1 1.4 (57%)  Ratio 64 p bevespi  x 2 pffs   - PFT's  07/19/2018  FEV1 1.63  (74 % ) ratio 68  p 13 % improvement from saba p nothing prior to study with DLCO  71/70c % corrects to 93 % for alv volume    - 02/05/2019   continue bevespi   - 03/2019 changed to trelegy due to insurance  - 12/20/2023   Walked on 3l POC  x  3  lap(s) =  approx 450  ft  @ slow/ rollator pace, stopped due to end of study  with lowest 02 sats 94% and sob at end no cp/ tightness     Group D (now reclassified as E) in terms of symptom/risk and laba/lama/ICS  therefore appropriate rx at this point >>>  Trelegy and min need for saba at this point  Chronic respiratory failure with hypoxia (HCC) See care everywhere pulmonary notes from 2016/ [facility] > rx 2lpm hs  - 06/19/2018  Walked RA x 3 laps @ 185 ft each stopped due to  End of study, moderately fast pace, no desat but sob at end    - 04/09/2019   [facility] RA  2 laps @  approx 247ft each @ moderate pace  stopped due to  Sob at end but sats still 91%  -  07/06/2020   Walked RA  approx   750 ft  @ moderate pace /awkward gait due to back pain  stopped due to  Sob with sats still 90%     - Resting RA sats 11/23/2023 = 93%  - 12/20/2023 sats ok walking on 3lpm POC x 450 ft but still sob at end      Lab Results  Component Value Date   HGB 12.9 12/12/2023   HGB 12.6 12/12/2023   HGB 13.7 10/30/2023   HGB 15.1 (H) 10/17/2023   HGB 13.9 11/06/2022   HGB 12.5 11/07/2021   HGB 11.4 (L) 10/20/2009    Again advised to titrate if needed to keep satds > 90% at all times - see avs for instructions unique to this ov         Each maintenance medication was reviewed  in detail including emphasizing most importantly the difference between maintenance and prns and under what circumstances the prns are to be triggered using an action plan format where appropriate.  Total time for H and P, chart review, counseling, reviewing dpi /neb/02/pulse ox device(s) and generating customized AVS unique to this office visit / same day charting = 25 min           Patient Instructions  No change in medications  Make sure you check your oxygen  saturation  AT  your highest level of activity (not after you stop)   to be sure it stays over 90% and adjust  02 flow upward to maintain this level if needed but remember to turn it back to previous settings when you stop (to conserve your supply).   Please schedule a follow up visit in 6 months but call sooner if needed    Ozell America, MD 04/21/2024

## 2024-04-21 ENCOUNTER — Encounter: Payer: Self-pay | Admitting: Internal Medicine

## 2024-04-21 ENCOUNTER — Ambulatory Visit (INDEPENDENT_AMBULATORY_CARE_PROVIDER_SITE_OTHER): Admitting: Internal Medicine

## 2024-04-21 VITALS — BP 132/83 | HR 77 | Ht 66.0 in | Wt 208.2 lb

## 2024-04-21 DIAGNOSIS — J449 Chronic obstructive pulmonary disease, unspecified: Secondary | ICD-10-CM | POA: Diagnosis not present

## 2024-04-21 DIAGNOSIS — Z87891 Personal history of nicotine dependence: Secondary | ICD-10-CM

## 2024-04-21 DIAGNOSIS — J441 Chronic obstructive pulmonary disease with (acute) exacerbation: Secondary | ICD-10-CM

## 2024-04-21 DIAGNOSIS — J9611 Chronic respiratory failure with hypoxia: Secondary | ICD-10-CM

## 2024-04-21 NOTE — Assessment & Plan Note (Addendum)
 Quit smoking 2006 PFT's  08/07/11  FEV1 2.14 (89 % ) ratio 69  with DLCO  73 % corrects to 108 % for alv volume  - Spirometry 06/29/2017  FEV1 1.24 (52%)  Ratio 64 on advair -  Allergy  profile 06/29/17  >  Eos 0.2/  IgE  13 RAST neg  - 06/29/2017  try bevespi   2bid > improved as of 08/10/18  -  Spirometry 06/19/2018  FEV1 1.4 (57%)  Ratio 64 p bevespi  x 2 pffs   - PFT's  07/19/2018  FEV1 1.63  (74 % ) ratio 68  p 13 % improvement from saba p nothing prior to study with DLCO  71/70c % corrects to 93 % for alv volume    - 02/05/2019   continue bevespi   - 03/2019 changed to trelegy due to insurance  - 12/20/2023   Walked on 3l POC  x  3  lap(s) =  approx 450  ft  @ slow/ rollator pace, stopped due to end of study  with lowest 02 sats 94% and sob at end no cp/ tightness     Group D (now reclassified as E) in terms of symptom/risk and laba/lama/ICS  therefore appropriate rx at this point >>>  Trelegy and min need for saba at this point

## 2024-04-21 NOTE — Assessment & Plan Note (Addendum)
 See care everywhere pulmonary notes from 2016/ [facility] > rx 2lpm hs  - 06/19/2018  Walked RA x 3 laps @ 185 ft each stopped due to  End of study, moderately fast pace, no desat but sob at end    - 04/09/2019   [facility] RA  2 laps @  approx 25ft each @ moderate pace  stopped due to  Sob at end but sats still 91%  -  07/06/2020   Walked RA  approx   750 ft  @ moderate pace /awkward gait due to back pain  stopped due to  Sob with sats still 90%     - Resting RA sats 11/23/2023 = 93%  - 12/20/2023 sats ok walking on 3lpm POC x 450 ft but still sob at end      Lab Results  Component Value Date   HGB 12.9 12/12/2023   HGB 12.6 12/12/2023   HGB 13.7 10/30/2023   HGB 15.1 (H) 10/17/2023   HGB 13.9 11/06/2022   HGB 12.5 11/07/2021   HGB 11.4 (L) 10/20/2009    Again advised to titrate if needed to keep satds > 90% at all times - see avs for instructions unique to this ov         Each maintenance medication was reviewed in detail including emphasizing most importantly the difference between maintenance and prns and under what circumstances the prns are to be triggered using an action plan format where appropriate.  Total time for H and P, chart review, counseling, reviewing dpi /neb/02/pulse ox device(s) and generating customized AVS unique to this office visit / same day charting = 25 min

## 2024-04-21 NOTE — Patient Instructions (Signed)
No change in medications  Make sure you check your oxygen saturation  AT  your highest level of activity (not after you stop)   to be sure it stays over 90% and adjust  02 flow upward to maintain this level if needed but remember to turn it back to previous settings when you stop (to conserve your supply).   Please schedule a follow up visit in 6 months but call sooner if needed

## 2024-04-22 DIAGNOSIS — M6281 Muscle weakness (generalized): Secondary | ICD-10-CM | POA: Diagnosis not present

## 2024-04-22 DIAGNOSIS — R278 Other lack of coordination: Secondary | ICD-10-CM | POA: Diagnosis not present

## 2024-04-22 DIAGNOSIS — R2681 Unsteadiness on feet: Secondary | ICD-10-CM | POA: Diagnosis not present

## 2024-04-22 DIAGNOSIS — Z9181 History of falling: Secondary | ICD-10-CM | POA: Diagnosis not present

## 2024-04-23 DIAGNOSIS — R2681 Unsteadiness on feet: Secondary | ICD-10-CM | POA: Diagnosis not present

## 2024-04-23 DIAGNOSIS — M6281 Muscle weakness (generalized): Secondary | ICD-10-CM | POA: Diagnosis not present

## 2024-04-23 DIAGNOSIS — Z9181 History of falling: Secondary | ICD-10-CM | POA: Diagnosis not present

## 2024-04-23 DIAGNOSIS — R278 Other lack of coordination: Secondary | ICD-10-CM | POA: Diagnosis not present

## 2024-04-24 DIAGNOSIS — Z9181 History of falling: Secondary | ICD-10-CM | POA: Diagnosis not present

## 2024-04-24 DIAGNOSIS — M6281 Muscle weakness (generalized): Secondary | ICD-10-CM | POA: Diagnosis not present

## 2024-04-24 DIAGNOSIS — R278 Other lack of coordination: Secondary | ICD-10-CM | POA: Diagnosis not present

## 2024-04-24 DIAGNOSIS — R2681 Unsteadiness on feet: Secondary | ICD-10-CM | POA: Diagnosis not present

## 2024-04-29 DIAGNOSIS — R278 Other lack of coordination: Secondary | ICD-10-CM | POA: Diagnosis not present

## 2024-04-29 DIAGNOSIS — Z9181 History of falling: Secondary | ICD-10-CM | POA: Diagnosis not present

## 2024-04-29 DIAGNOSIS — M6281 Muscle weakness (generalized): Secondary | ICD-10-CM | POA: Diagnosis not present

## 2024-04-29 DIAGNOSIS — R2681 Unsteadiness on feet: Secondary | ICD-10-CM | POA: Diagnosis not present

## 2024-05-01 DIAGNOSIS — M6281 Muscle weakness (generalized): Secondary | ICD-10-CM | POA: Diagnosis not present

## 2024-05-01 DIAGNOSIS — R2681 Unsteadiness on feet: Secondary | ICD-10-CM | POA: Diagnosis not present

## 2024-05-01 DIAGNOSIS — R278 Other lack of coordination: Secondary | ICD-10-CM | POA: Diagnosis not present

## 2024-05-01 DIAGNOSIS — Z9181 History of falling: Secondary | ICD-10-CM | POA: Diagnosis not present

## 2024-05-07 DIAGNOSIS — M6281 Muscle weakness (generalized): Secondary | ICD-10-CM | POA: Diagnosis not present

## 2024-05-07 DIAGNOSIS — R278 Other lack of coordination: Secondary | ICD-10-CM | POA: Diagnosis not present

## 2024-05-07 DIAGNOSIS — Z9181 History of falling: Secondary | ICD-10-CM | POA: Diagnosis not present

## 2024-05-07 DIAGNOSIS — R2681 Unsteadiness on feet: Secondary | ICD-10-CM | POA: Diagnosis not present

## 2024-05-09 DIAGNOSIS — M6281 Muscle weakness (generalized): Secondary | ICD-10-CM | POA: Diagnosis not present

## 2024-05-09 DIAGNOSIS — R2681 Unsteadiness on feet: Secondary | ICD-10-CM | POA: Diagnosis not present

## 2024-05-09 DIAGNOSIS — Z9181 History of falling: Secondary | ICD-10-CM | POA: Diagnosis not present

## 2024-05-09 DIAGNOSIS — R278 Other lack of coordination: Secondary | ICD-10-CM | POA: Diagnosis not present

## 2024-05-12 DIAGNOSIS — Z9181 History of falling: Secondary | ICD-10-CM | POA: Diagnosis not present

## 2024-05-12 DIAGNOSIS — R278 Other lack of coordination: Secondary | ICD-10-CM | POA: Diagnosis not present

## 2024-05-12 DIAGNOSIS — R2681 Unsteadiness on feet: Secondary | ICD-10-CM | POA: Diagnosis not present

## 2024-05-12 DIAGNOSIS — M6281 Muscle weakness (generalized): Secondary | ICD-10-CM | POA: Diagnosis not present

## 2024-05-14 ENCOUNTER — Other Ambulatory Visit: Payer: Self-pay

## 2024-05-14 ENCOUNTER — Encounter: Payer: Self-pay | Admitting: Internal Medicine

## 2024-05-14 DIAGNOSIS — J449 Chronic obstructive pulmonary disease, unspecified: Secondary | ICD-10-CM

## 2024-05-14 DIAGNOSIS — M6281 Muscle weakness (generalized): Secondary | ICD-10-CM | POA: Diagnosis not present

## 2024-05-14 DIAGNOSIS — R2681 Unsteadiness on feet: Secondary | ICD-10-CM | POA: Diagnosis not present

## 2024-05-14 DIAGNOSIS — R278 Other lack of coordination: Secondary | ICD-10-CM | POA: Diagnosis not present

## 2024-05-14 DIAGNOSIS — Z9181 History of falling: Secondary | ICD-10-CM | POA: Diagnosis not present

## 2024-05-19 DIAGNOSIS — R2681 Unsteadiness on feet: Secondary | ICD-10-CM | POA: Diagnosis not present

## 2024-05-21 ENCOUNTER — Telehealth: Payer: Self-pay

## 2024-05-21 DIAGNOSIS — R2681 Unsteadiness on feet: Secondary | ICD-10-CM | POA: Diagnosis not present

## 2024-05-21 NOTE — Telephone Encounter (Signed)
 Spoke with pt to inform her of the order I placed and if she doesn't hear anything by Monday to let me know

## 2024-05-22 DIAGNOSIS — R2681 Unsteadiness on feet: Secondary | ICD-10-CM | POA: Diagnosis not present

## 2024-05-26 ENCOUNTER — Encounter: Payer: Self-pay | Admitting: Internal Medicine

## 2024-05-27 DIAGNOSIS — R2681 Unsteadiness on feet: Secondary | ICD-10-CM | POA: Diagnosis not present

## 2024-05-28 DIAGNOSIS — R2681 Unsteadiness on feet: Secondary | ICD-10-CM | POA: Diagnosis not present

## 2024-06-02 DIAGNOSIS — R2681 Unsteadiness on feet: Secondary | ICD-10-CM | POA: Diagnosis not present

## 2024-06-03 ENCOUNTER — Telehealth: Payer: Self-pay | Admitting: Family Medicine

## 2024-06-03 ENCOUNTER — Other Ambulatory Visit: Payer: Self-pay

## 2024-06-03 DIAGNOSIS — E039 Hypothyroidism, unspecified: Secondary | ICD-10-CM

## 2024-06-03 DIAGNOSIS — F411 Generalized anxiety disorder: Secondary | ICD-10-CM

## 2024-06-03 DIAGNOSIS — J449 Chronic obstructive pulmonary disease, unspecified: Secondary | ICD-10-CM

## 2024-06-03 DIAGNOSIS — R55 Syncope and collapse: Secondary | ICD-10-CM

## 2024-06-03 DIAGNOSIS — I471 Supraventricular tachycardia, unspecified: Secondary | ICD-10-CM

## 2024-06-03 MED ORDER — TRELEGY ELLIPTA 100-62.5-25 MCG/ACT IN AEPB: 1.0000 | INHALATION_SPRAY | Freq: Every day | RESPIRATORY_TRACT | 5 refills | Status: DC

## 2024-06-03 MED ORDER — METOPROLOL SUCCINATE ER 25 MG PO TB24: 25.0000 mg | ORAL_TABLET | Freq: Every day | ORAL | 3 refills | Status: DC

## 2024-06-03 MED ORDER — PANTOPRAZOLE SODIUM 40 MG PO TBEC: 40.0000 mg | DELAYED_RELEASE_TABLET | Freq: Every day | ORAL | 0 refills | Status: DC

## 2024-06-03 MED ORDER — LEVOTHYROXINE SODIUM 125 MCG PO TABS: 125.0000 ug | ORAL_TABLET | Freq: Every day | ORAL | 0 refills | Status: DC

## 2024-06-03 MED ORDER — TRAZODONE HCL 50 MG PO TABS: 50.0000 mg | ORAL_TABLET | Freq: Every day | ORAL | 2 refills | Status: DC

## 2024-06-03 MED ORDER — BUPROPION HCL ER (XL) 150 MG PO TB24: 150.0000 mg | ORAL_TABLET | Freq: Every day | ORAL | 2 refills | Status: DC

## 2024-06-03 NOTE — Telephone Encounter (Signed)
 Sent in medication

## 2024-06-03 NOTE — Telephone Encounter (Signed)
 Prescription Request  06/03/2024  LOV: 01/29/2024  What is the name of the medication or equipment?   Fluticasone -Umeclidin-Vilant (TRELEGY ELLIPTA ) 100-62.5-25 MCG/ACT AEPB   buPROPion  (WELLBUTRIN  XL) 150 MG 24 hr tablet   traZODone  (DESYREL ) 50 MG tablet  levothyroxine  (SYNTHROID ) 125 MCG tablet   metoprolol  succinate (TOPROL -XL) 25 MG 24 hr tablet   pantoprazole  (PROTONIX ) 40 MG tablet  **90 day scripts requested**  Have you contacted your pharmacy to request a refill? Yes   Which pharmacy would you like this sent to?  [facility] - [place], [place] - [address] [place] [address] Phone: [phone] Fax: [phone]    Patient notified that their request is being sent to the clinical staff for review and that they should receive a response within 2 business days.   Please advise pharmacist.

## 2024-06-04 DIAGNOSIS — R2681 Unsteadiness on feet: Secondary | ICD-10-CM | POA: Diagnosis not present

## 2024-06-05 ENCOUNTER — Other Ambulatory Visit: Payer: Self-pay | Admitting: Family Medicine

## 2024-06-05 DIAGNOSIS — R2681 Unsteadiness on feet: Secondary | ICD-10-CM | POA: Diagnosis not present

## 2024-06-06 ENCOUNTER — Other Ambulatory Visit: Payer: Self-pay | Admitting: Family Medicine

## 2024-06-06 DIAGNOSIS — E039 Hypothyroidism, unspecified: Secondary | ICD-10-CM

## 2024-06-06 NOTE — Telephone Encounter (Signed)
 Change of pharmacy  Requested Prescriptions  Pending Prescriptions Disp Refills   levothyroxine  (SYNTHROID ) 125 MCG tablet [Pharmacy Med Name: LEVOTHYROXINE  125 MCG TABLET] 90 tablet 0    Sig: TAKE 1 TABLET BY MOUTH DAILY BEFORE BREAKFAST.     Endocrinology:  Hypothyroid Agents Passed - 06/06/2024  3:45 PM      Passed - TSH in normal range and within 360 days    TSH  Date Value Ref Range Status  08/17/2023 3.22 0.40 - 4.50 mIU/L Final         Passed - Valid encounter within last 12 months    Recent Outpatient Visits           4 months ago SVT (supraventricular tachycardia)   [place] [facility] Duanne Butler DASEN, MD   7 months ago Syncope, unspecified syncope type   [place] [facility] Duanne Butler DASEN, MD   7 months ago Acute bronchitis, unspecified organism   [place] [facility] Aletha Bene, MD   8 months ago Recurrent major depressive disorder, in partial remission    [facility] Duanne Butler DASEN, MD   9 months ago Hypothyroidism, unspecified type    [facility] Pickard, Butler DASEN, MD

## 2024-06-11 DIAGNOSIS — R2681 Unsteadiness on feet: Secondary | ICD-10-CM | POA: Diagnosis not present

## 2024-06-12 DIAGNOSIS — R2681 Unsteadiness on feet: Secondary | ICD-10-CM | POA: Diagnosis not present

## 2024-06-13 ENCOUNTER — Other Ambulatory Visit: Payer: Self-pay

## 2024-06-13 DIAGNOSIS — R2681 Unsteadiness on feet: Secondary | ICD-10-CM | POA: Diagnosis not present

## 2024-06-13 MED ORDER — FAMOTIDINE 20 MG PO TABS: ORAL_TABLET | ORAL | 2 refills | Status: AC

## 2024-06-17 ENCOUNTER — Telehealth: Payer: Self-pay

## 2024-06-17 DIAGNOSIS — R2681 Unsteadiness on feet: Secondary | ICD-10-CM | POA: Diagnosis not present

## 2024-06-17 NOTE — Telephone Encounter (Unsigned)
 Copied from CRM [id number]. Topic: Clinical - Lab/Test Results >> Jun 11, 2024  4:52 PM [name] wrote: Reason for CRM: [name] with [employer] is calling to state that they need [name] complete Walk Test. Missing Step One and Step Two. Step One is for room air at rest without oxygen  with SPO2 Step Two with walking without oxygen  and measuring SPO2 Third Step was received.   [name] is requesting to have the complete 6 Minute walk sent over to [phone] , so they can complete documents needed to get O2 registered. >> Jun 16, 2024  3:58 PM [name] wrote: [name] from [employer] called again to verify [name] fax was received on 06/10/24 regarding [name]'s PFT.  If you don't have the full 6 minute walk from 04/21/24, please reach out to the [name] and schedule [name] new test.  Her order is on hold right now until the test is completed.  She will re-fax [name] new order that needs to be approved and signed and returned.

## 2024-06-17 NOTE — Telephone Encounter (Signed)
 Copied from CRM [id number]. Topic: Clinical - Lab/Test Results >> Jun 11, 2024  4:52 PM Joesph PARAS wrote: Reason for CRM: Grayce with [employer] is calling to state that they need a complete Walk Test. Missing Step One and Step Two. Step One is for room air at rest without oxygen  with SPO2 Step Two with walking without oxygen  and measuring SPO2 Third Step was received.   Grayce is requesting to have the complete 6 Minute walk sent over to [phone] , so they can complete documents needed to get O2 registered. >> Jun 16, 2024  3:58 PM Essie A wrote: Sonya from [employer] called again to verify a fax was received on 06/10/24 regarding patient's PFT.  If you don't have the full 6 minute walk from 04/21/24, please reach out to the patient and schedule a new test.  Her order is on hold right now until the test is completed.  She will re-fax a new order that needs to be approved and signed and returned.   It looks like pt is needing a complete 6 min walk test - apparently steps 1 and 2 need to be complete.

## 2024-06-18 DIAGNOSIS — R2681 Unsteadiness on feet: Secondary | ICD-10-CM | POA: Diagnosis not present

## 2024-06-19 DIAGNOSIS — R2681 Unsteadiness on feet: Secondary | ICD-10-CM | POA: Diagnosis not present

## 2024-06-20 NOTE — Telephone Encounter (Signed)
 Pt needs appt to complete a walk test please

## 2024-06-23 NOTE — Telephone Encounter (Signed)
 Attempted to contact patient to schedule appointment. Mychart message sent

## 2024-06-23 NOTE — Telephone Encounter (Signed)
 Pt need appt for walk test per [employer]

## 2024-06-25 DIAGNOSIS — R2681 Unsteadiness on feet: Secondary | ICD-10-CM | POA: Diagnosis not present

## 2024-06-26 NOTE — Telephone Encounter (Signed)
 Attempted to call patient again. Voicemail sent. Mychart message sent. Unable to reach patient. Closing encounter until patient reaches out.

## 2024-06-27 ENCOUNTER — Telehealth: Payer: Self-pay

## 2024-06-27 DIAGNOSIS — R2681 Unsteadiness on feet: Secondary | ICD-10-CM | POA: Diagnosis not present

## 2024-06-27 NOTE — Telephone Encounter (Addendum)
 [facility] faxed in orders for assessment on 06/24/2024   POC is to increase endurance, balance, strength,  functional mobility and reduce fall risk. Pt. Desires to increase independence w/ going outdoors onto her deck and front yard.   Possible procedures treated over full course of treatment [id number] [phone] Pt. Education for safety education to always use oxygen  and not to exit house independently.    Frequency of treatment : 3x every week. Duration of treatment until 09/11/2024  Dr. Duanne signed this order stating he had no revisions to the plan of care. Signed on 06/26/2024 Faxed to [facility] at  Phone : [phone] Fax. [phone] Faxed to [facility] on 06/27/2024 by SRP, CMA at 12:00 pm

## 2024-06-30 ENCOUNTER — Encounter: Payer: Self-pay | Admitting: Family Medicine

## 2024-07-01 DIAGNOSIS — R2681 Unsteadiness on feet: Secondary | ICD-10-CM | POA: Diagnosis not present

## 2024-07-03 ENCOUNTER — Telehealth: Payer: Self-pay | Admitting: Internal Medicine

## 2024-07-03 NOTE — Telephone Encounter (Signed)
 Copied from CRM [id number]. Topic: General - Other >> Jul 03, 2024  2:11 PM Joesph PARAS wrote: Reason for CRM: Grayce with [employer] is calling to request a walk test report be faxed to them, along with a completed rx form for the oxygen  faxed to us  on 10/15. States will re-fax.   F: [phone] C/B: [phone]

## 2024-07-07 DIAGNOSIS — R2681 Unsteadiness on feet: Secondary | ICD-10-CM | POA: Diagnosis not present

## 2024-07-08 NOTE — Telephone Encounter (Signed)
 6 min walk test scheduled 07/14/2024.

## 2024-07-10 DIAGNOSIS — R2681 Unsteadiness on feet: Secondary | ICD-10-CM | POA: Diagnosis not present

## 2024-07-11 DIAGNOSIS — R2681 Unsteadiness on feet: Secondary | ICD-10-CM | POA: Diagnosis not present

## 2024-07-14 ENCOUNTER — Ambulatory Visit: Admitting: Internal Medicine

## 2024-07-14 DIAGNOSIS — J9611 Chronic respiratory failure with hypoxia: Secondary | ICD-10-CM

## 2024-07-14 NOTE — Progress Notes (Signed)
 Six Minute Walk - 07/14/24 1434       Six Minute Walk   Medications taken before test (dose and time) ALBUTEROL  SULFATE 108 MCG/ACT, ALBUTEROL  SULFATE (neb) 0.0083%,  bupropion  HCl 150 mg, Cholecalciferol 1.25 mg, Cyanocobalamin 1000 mg, famotidine  20 mg, fluoxetine  hcl 20 mg, trelegy ellipta  100/62.5/25, levothyroxine  sodium 125 mcg, losartan  potassium-hctz 100-25 mg, metoprolol  succinate 25 mg, rousuvastatin 20 mg    Supplemental oxygen  during test? Yes    O2 Flow Rate (L/min) 2 L/min    Type Pulse    Lap distance in meters  34 meters    Laps Completed 13    Partial lap (in meters) 9 meters    Baseline BP (sitting) 139/94    Baseline Heartrate 59    Baseline Dyspnea (Borg Scale) 4    Baseline Fatigue (Borg Scale) 4    Baseline SPO2 93 %   2L     End of Test Values    BP (sitting) 158/71    Heartrate 64    Dyspnea (Borg Scale) 4    Fatigue (Borg Scale) 4    SPO2 95 %   2L     2 Minutes Post Walk Values   BP (sitting) 114/61    Heartrate 62    SPO2 97 %   2L   Stopped or paused before six minutes? Yes    Reason: fatigue (stopped at 2 minutes and 47 sec. left.    Other Symptoms at end of exercise: Dizziness      Interpretation   Distance completed 451 meters    Tech Comments: Walked with a rolator at a moderate pace.  Her gate was unsteady at times.  She reported dizziness on the last 2 laps.

## 2024-07-15 DIAGNOSIS — R2681 Unsteadiness on feet: Secondary | ICD-10-CM | POA: Diagnosis not present

## 2024-07-17 DIAGNOSIS — R2681 Unsteadiness on feet: Secondary | ICD-10-CM | POA: Diagnosis not present

## 2024-07-18 DIAGNOSIS — R2681 Unsteadiness on feet: Secondary | ICD-10-CM | POA: Diagnosis not present

## 2024-07-21 DIAGNOSIS — R2681 Unsteadiness on feet: Secondary | ICD-10-CM | POA: Diagnosis not present

## 2024-07-23 ENCOUNTER — Encounter: Payer: Self-pay | Admitting: Family Medicine

## 2024-07-23 DIAGNOSIS — R2681 Unsteadiness on feet: Secondary | ICD-10-CM | POA: Diagnosis not present

## 2024-07-24 ENCOUNTER — Other Ambulatory Visit: Payer: Self-pay | Admitting: Family Medicine

## 2024-07-24 DIAGNOSIS — R2681 Unsteadiness on feet: Secondary | ICD-10-CM | POA: Diagnosis not present

## 2024-07-24 MED ORDER — TRAZODONE HCL 100 MG PO TABS: 100.0000 mg | ORAL_TABLET | Freq: Every day | ORAL | 3 refills | Status: DC

## 2024-07-25 ENCOUNTER — Other Ambulatory Visit: Payer: Self-pay | Admitting: Family Medicine

## 2024-07-25 DIAGNOSIS — F411 Generalized anxiety disorder: Secondary | ICD-10-CM

## 2024-07-25 NOTE — Telephone Encounter (Signed)
 Prescription Request  07/25/2024  LOV:01/29/24  What is the name of the medication or equipment? buPROPion  (WELLBUTRIN  XL) 150 MG 24 hr tablet [[id number]]   Have you contacted your pharmacy to request a refill? Yes   Which pharmacy would you like this sent to?  [id number] [place] - [address] [place] [address] Phone: [phone] Fax: [phone]    Patient notified that their request is being sent to the clinical staff for review and that they should receive a response within 2 business days.   Please advise at [facility] [phone]

## 2024-07-27 MED ORDER — BUPROPION HCL ER (XL) 150 MG PO TB24: 150.0000 mg | ORAL_TABLET | Freq: Every day | ORAL | 1 refills | Status: DC

## 2024-07-27 NOTE — Telephone Encounter (Signed)
 Requested Prescriptions  Pending Prescriptions Disp Refills   buPROPion  (WELLBUTRIN  XL) 150 MG 24 hr tablet 90 tablet 2    Sig: Take 1 tablet (150 mg total) by mouth daily.     Psychiatry: Antidepressants - bupropion  Passed - 07/27/2024  2:02 PM      Passed - Cr in normal range and within 360 days    Creatinine  Date Value Ref Range Status  10/30/2023 0.87 0.44 - 1.00 mg/dL Final   Creat  Date Value Ref Range Status  08/17/2023 0.85 0.60 - 0.95 mg/dL Final   Creatinine, Ser  Date Value Ref Range Status  12/12/2023 0.90 0.44 - 1.00 mg/dL Final         Passed - AST in normal range and within 360 days    AST  Date Value Ref Range Status  12/12/2023 18 15 - 41 U/L Final  10/30/2023 18 15 - 41 U/L Final         Passed - ALT in normal range and within 360 days    ALT  Date Value Ref Range Status  12/12/2023 14 0 - 44 U/L Final  10/30/2023 22 0 - 44 U/L Final         Passed - Completed PHQ-2 or PHQ-9 in the last 360 days      Passed - Last BP in normal range    BP Readings from Last 1 Encounters:  04/21/24 132/83         Passed - Valid encounter within last 6 months    Recent Outpatient Visits           6 months ago SVT (supraventricular tachycardia)   [facility] Duanne Butler DASEN, MD   9 months ago Syncope, unspecified syncope type   [place] [facility] Duanne Butler DASEN, MD   9 months ago Acute bronchitis, unspecified organism   [place] [facility] Aletha Bene, MD   10 months ago Recurrent major depressive disorder, in partial remission   [place] [facility] Duanne Butler DASEN, MD   11 months ago Hypothyroidism, unspecified type   [place] [facility] [place], Butler DASEN, MD

## 2024-07-28 DIAGNOSIS — R2681 Unsteadiness on feet: Secondary | ICD-10-CM | POA: Diagnosis not present

## 2024-07-30 DIAGNOSIS — R2681 Unsteadiness on feet: Secondary | ICD-10-CM | POA: Diagnosis not present

## 2024-07-31 DIAGNOSIS — R2681 Unsteadiness on feet: Secondary | ICD-10-CM | POA: Diagnosis not present

## 2024-08-05 DIAGNOSIS — R2681 Unsteadiness on feet: Secondary | ICD-10-CM | POA: Diagnosis not present

## 2024-08-06 DIAGNOSIS — R2681 Unsteadiness on feet: Secondary | ICD-10-CM | POA: Diagnosis not present

## 2024-08-12 ENCOUNTER — Other Ambulatory Visit: Payer: Self-pay | Admitting: Family Medicine

## 2024-08-12 DIAGNOSIS — J302 Other seasonal allergic rhinitis: Secondary | ICD-10-CM

## 2024-08-12 DIAGNOSIS — R2681 Unsteadiness on feet: Secondary | ICD-10-CM | POA: Diagnosis not present

## 2024-08-13 DIAGNOSIS — R2681 Unsteadiness on feet: Secondary | ICD-10-CM | POA: Diagnosis not present

## 2024-08-14 DIAGNOSIS — R2681 Unsteadiness on feet: Secondary | ICD-10-CM | POA: Diagnosis not present

## 2024-08-20 DIAGNOSIS — R2681 Unsteadiness on feet: Secondary | ICD-10-CM | POA: Diagnosis not present

## 2024-08-21 DIAGNOSIS — R2681 Unsteadiness on feet: Secondary | ICD-10-CM | POA: Diagnosis not present

## 2024-08-22 DIAGNOSIS — R2681 Unsteadiness on feet: Secondary | ICD-10-CM | POA: Diagnosis not present

## 2024-09-09 ENCOUNTER — Other Ambulatory Visit: Payer: Self-pay | Admitting: Family Medicine

## 2024-09-09 MED ORDER — TRAZODONE HCL 100 MG PO TABS: 100.0000 mg | ORAL_TABLET | Freq: Every day | ORAL | 3 refills | Status: AC

## 2024-09-17 ENCOUNTER — Encounter: Payer: Self-pay | Admitting: Family Medicine

## 2024-09-17 ENCOUNTER — Other Ambulatory Visit: Payer: Self-pay

## 2024-09-17 ENCOUNTER — Ambulatory Visit: Payer: Self-pay

## 2024-09-17 NOTE — Telephone Encounter (Signed)
 FYI Only or Action Required?: Action required by provider: request for appointment.  Patient was last seen in primary care on 01/29/2024 by Donna Butler DASEN, MD.  Called Nurse Triage reporting Nasal Congestion and Shortness of Breath.  Symptoms began 1 month.  Interventions attempted: OTC medications: saline spray , zyrtec.  Symptoms are: stable.  Triage Disposition: See PCP When Office is Open (Within 3 Days)  Patient/caregiver understands and will follow disposition?: No, wishes to speak with PCP   Reason for Disposition  [1] Nasal discharge AND [2] present > 10 days  Answer Assessment - Initial Assessment Questions Patient reports trouble with stuffy nose, and has oxygen  in nose 2L always. Doesn't feel its an emergency needs Tuesday or Thursday due to ride. Oxygen  saturation was 97%. Shortness of breath is all the time, but worse with exertion and this more than baseline of COPD. Has cough in the morning yellow mucus , feels its built up in lungs overnight. Takes guaifenesin  at night and take 1 in the morning, this is chronic thing. When blows nose breathing is better and congestion clears up. Was taking zyertec for allergies for a long time doesn't seem to be working as well. Wants to be seen in office on a Thursday or Tuesday when helper can bring her , PCP only for appointment , soonest appointment was 1/22 patient requesting sooner than that, there is existing note about asking provider what patient can take for allergies as well      1. ONSET: When did the nasal discharge start?      Nasal congestion has been for about 1 month  , had been using saline spray not helping , and has had Flonase  too   3. COUGH: Do you have a cough? If Yes, ask: Describe the color of your mucus. (e.g., clear, white, yellow, green)     Has chronic cough that is cough up phlegm not new  4. RESPIRATORY DISTRESS: Describe your breathing.      Feeling short of breath with congestion in nose , but  able to clear it which helps  5. FEVER: Do you have a fever? If Yes, ask: What is your temperature, how was it measured, and when did it start?     Denies  6. SEVERITY: Overall, how bad are you feeling right now? (e.g., doesn't interfere with normal activities, staying home from school/work, staying in bed)      Feeling fine otherwise just the congestion  7. OTHER SYMPTOMS: Do you have any other symptoms? (e.g., earache, mouth sores, sore throat, wheezing)     Nasal congestion , nose is blocked . Patient speaking in full complete sentences   Patient denies chest pain or fever  Protocols used: Common Cold-A-AH Copied from CRM #[id number]. Topic: Clinical - Red Word Triage >> Sep 17, 2024  2:13 PM [name] wrote: Red Word that prompted transfer to Nurse Triage: Patient can't breath. Sometimes gets stuffy and have difficulty breathing. Patient uses 2 liters per minute as well as overnight.  Needs appt with Dr. Duanne.

## 2024-09-18 ENCOUNTER — Other Ambulatory Visit: Payer: Self-pay

## 2024-09-18 DIAGNOSIS — J302 Other seasonal allergic rhinitis: Secondary | ICD-10-CM

## 2024-09-18 MED ORDER — LEVOCETIRIZINE DIHYDROCHLORIDE 5 MG PO TABS: 5.0000 mg | ORAL_TABLET | Freq: Every evening | ORAL | 3 refills | Status: DC

## 2024-10-07 ENCOUNTER — Ambulatory Visit: Admitting: Family Medicine

## 2024-10-16 ENCOUNTER — Ambulatory Visit: Admitting: Family Medicine

## 2024-10-16 ENCOUNTER — Encounter: Payer: Self-pay | Admitting: Family Medicine

## 2024-10-16 VITALS — BP 114/62 | HR 59 | Temp 97.9°F | Ht 66.0 in | Wt 206.0 lb

## 2024-10-16 DIAGNOSIS — I10 Essential (primary) hypertension: Secondary | ICD-10-CM

## 2024-10-16 DIAGNOSIS — E039 Hypothyroidism, unspecified: Secondary | ICD-10-CM

## 2024-10-16 NOTE — Progress Notes (Signed)
 "  Subjective:    Patient ID: [name], female    DOB: 01/28/1941, 84 y.o.   MRN: [id number] Patient has a history of cancer in the left upper lung.  She is scheduled to see her pulmonologist next month.  They perform a CT scan every year to monitor this.  She is on constant oxygen  however she denies any new shortness of breath.  She denies any pleurisy or hemoptysis.  She has a history of COPD, hypothyroidism, and hypertension.  Her blood pressure today is 114/62.  She denies any chest pain.  She denies any syncope or lightheadedness.  She is overdue for fasting lab work.  Her immunizations are up-to-date. Immunization History  Administered Date(s) Administered   Fluad Quad(high Dose 65+) 05/13/2019, 06/29/2020, 06/08/2022, 27-Jun-2023   INFLUENZA, HIGH DOSE SEASONAL PF 06/22/2015   Influenza,inj,Quad PF,6+ Mos 05/29/2014, 05/30/2016, 05/28/2017, 05/21/2018   Influenza-Unspecified 05/29/2014, 06/23/2015, 05/30/2016, 05/28/2017, 05/21/2018   Moderna Sars-Covid-2 Vaccination 09/24/2019, 10/22/2019, 06/03/2020   PFIZER(Purple Top)SARS-COV-2 Vaccination 06/27/2023   Pfizer(Comirnaty)Fall Seasonal Vaccine 12 years and older 06/14/2022, 05/27/2024   Pneumococcal Conjugate-13 01/16/2014   Pneumococcal Polysaccharide-23 07/03/2012, 05/10/2020   Td 09/12/1993   Td (Adult), 2 Lf Tetanus Toxid, Preservative Free 09/12/1993   Tdap 12/21/2012   Zoster Recombinant(Shingrix) 01/04/2020, 03/04/2020   Zoster, Live 09/27/2007   Patient believes she had the RSV vaccine at her pharmacy.  She declines a referral for a mammogram.  She declines a referral for a bone density test despite her history of osteopenia Past Medical History:  Diagnosis Date   ADD (attention deficit disorder)    Allergic rhinitis    Allergy  1988   Anemia    Anxiety    Arrhythmia    Arthritis    hands, neck, right shoulder (07/28/2015)   Aseptic necrosis (HCC)    Asthma dxc'd 05/2015   Basal cell cancer    Bleeding  stomach ulcer ~ 06-26-05   COPD (chronic obstructive pulmonary disease) (HCC)    Depression    Diverticula of colon    Dyspnea    Emphysema of lung (HCC) 08/2005   Esophagitis    Gastric ulcer with hemorrhage    GERD (gastroesophageal reflux disease)    Heart murmur    History of blood transfusion 2005-06-26   related to pneumonia   History of kidney stones    Hyperlipidemia    Hypertension    Hypothyroidism    Kidney stones    Lung cancer (HCC)    Non small cell lung cancer, adenocarcinoma with BAC 09/2009, VATS converted to wedge resection of left upper lobe mass   Mitral valve prolapse    no ORs (07/28/2015)   Osteopenia    Osteoporosis 10/27/2006   Tested wwas on jaw death med   Pneumonia 1970's; 06-26-2005   Sleep apnea dx'd 2015-06-27   on oxygen  at nite at 2L/[place]    Squamous cell skin cancer    Uterine cancer [facility])    Past Surgical History:  Procedure Laterality Date   ABDOMINAL HYSTERECTOMY  09/11/1986   CESAREAN SECTION     COLONOSCOPY WITH PROPOFOL  N/A 06/15/2017   Procedure: COLONOSCOPY WITH PROPOFOL ;  Surgeon: Rollin Dover, MD;  Location: [facility];  Service: Endoscopy;  Laterality: N/A;   EYE SURGERY     HAMMER TOE SURGERY Right ~ 2008-10-27   SALPINGOOPHORECTOMY Left 09/11/1986   SKIN CANCER EXCISION     I've had severl cut off RLE; left foreqrm, nose under nose   THORACOTOMY Left 06/11/2005  THORACOTOMY/LOBECTOMY  09/2009; 11/2009   left; right   Current Outpatient Medications on File Prior to Visit  Medication Sig Dispense Refill   albuterol  (PROVENTIL ) (2.5 MG/3ML) 0.083% nebulizer solution Take 3 mLs (2.5 mg total) by nebulization every 6 (six) hours as needed for wheezing or shortness of breath. 75 mL 5   albuterol  (VENTOLIN  HFA) 108 (90 Base) MCG/ACT inhaler Inhale 1 puff into the lungs every 6 (six) hours as needed for wheezing or shortness of breath. 8 g 2   buPROPion  (WELLBUTRIN  XL) 150 MG 24 hr tablet Take 1 tablet (150 mg total) by mouth daily. 90 tablet 1    CALCIUM  PO Take 1,200 mg by mouth every other day.     Cholecalciferol (VITAMIN D3) 1.25 MG (50000 UT) CAPS Take 50,000 Units by mouth every Sunday.     Cyanocobalamin 1000 MCG CAPS Take 1,000 mcg by mouth daily.     famotidine  (PEPCID ) 20 MG tablet One after supper 30 tablet 2   FLUoxetine  (PROZAC ) 20 MG tablet Take 2 tablets (40 mg total) by mouth daily. Stop duloxetine  180 tablet 0   Fluticasone -Umeclidin-Vilant (TRELEGY ELLIPTA ) 100-62.5-25 MCG/ACT AEPB Inhale 1 puff into the lungs daily. 1 each 5   levocetirizine (XYZAL ) 5 MG tablet Take 1 tablet (5 mg total) by mouth every evening. 30 tablet 3   levothyroxine  (SYNTHROID ) 125 MCG tablet TAKE 1 TABLET BY MOUTH DAILY BEFORE BREAKFAST. 90 tablet 0   losartan -hydrochlorothiazide  (HYZAAR) 100-25 MG tablet Take 1 tablet by mouth daily. 90 tablet 2   metoprolol  succinate (TOPROL -XL) 25 MG 24 hr tablet Take 1 tablet (25 mg total) by mouth daily. 90 tablet 3   naproxen sodium (ALEVE) 220 MG tablet Take 220-440 mg by mouth 2 (two) times daily as needed (for pain- TAKE WITH FOOD).     OXYGEN  Inhale 2 L/min into the lungs See admin instructions. Inhale 2 L/min into the lungs at bedtime and during the day as needed for shortness of breath     rosuvastatin  (CRESTOR ) 20 MG tablet Take 1 tablet (20 mg total) by mouth daily. 90 tablet 2   traZODone  (DESYREL ) 100 MG tablet Take 1 tablet (100 mg total) by mouth at bedtime. 90 tablet 3   fluticasone  (FLONASE ) 50 MCG/ACT nasal spray USE 2 SPRAYS IN EACH NOSTRIL DAILY (Patient not taking: Reported on 10/16/2024) 48 g 3   No current facility-administered medications on file prior to visit.    Allergies  Allergen Reactions   Sulfonamide Derivatives Hives   Erythromycin  Nausea And Vomiting   Amoxicillin  Diarrhea, Nausea And Vomiting and Other (See Comments)    Also, it does not work   Doxycycline Nausea Only   Social History   Socioeconomic History   Marital status: Married    Spouse name: Not on file    Number of children: 3   Years of education: Not on file   Highest education level: Not on file  Occupational History   Occupation: retired Academic Librarian: RETIRED  Tobacco Use   Smoking status: Former    Current packs/day: 0.00    Average packs/day: 0.8 packs/day for 30.0 years (22.5 ttl pk-yrs)    Types: Cigarettes    Start date: 09/11/1974    Quit date: 09/11/2004    Years since quitting: 20.1   Smokeless tobacco: Never   Tobacco comments:    Pt is vaping x 1.5 years.  Contains nicotine  Vaping Use   Vaping status: Never Used  Substance and Sexual  Activity   Alcohol use: Yes    Alcohol/week: 7.0 standard drinks of alcohol    Types: 7 Shots of liquor per week    Comment: 1-2 drinks nightly    Drug use: No   Sexual activity: Yes  Other Topics Concern   Not on file  Social History Narrative   Not on file   Social Drivers of Health   Tobacco Use: Medium Risk (10/16/2024)   Patient History    Smoking Tobacco Use: Former    Smokeless Tobacco Use: Never    Passive Exposure: Not on file  Financial Resource Strain: Low Risk (08/30/2023)   Overall Financial Resource Strain (CARDIA)    Difficulty of Paying Living Expenses: Not hard at all  Food Insecurity: No Food Insecurity (08/30/2023)   Hunger Vital Sign    Worried About Running Out of Food in the Last Year: Never true    Ran Out of Food in the Last Year: Never true  Transportation Needs: No Transportation Needs (08/30/2023)   PRAPARE - Administrator, Civil Service (Medical): No    Lack of Transportation (Non-Medical): No  Physical Activity: Inactive (08/30/2023)   Exercise Vital Sign    Days of Exercise per Week: 0 days    Minutes of Exercise per Session: 0 min  Stress: No Stress Concern Present (08/30/2023)   [employer] of Occupational Health - Occupational Stress Questionnaire    Feeling of Stress : Only a little  Social Connections: Socially Isolated (08/30/2023)   Social Connection and Isolation  Panel    Frequency of Communication with Friends and Family: More than three times a week    Frequency of Social Gatherings with Friends and Family: Once a week    Attends Religious Services: Never    Database Administrator or Organizations: No    Attends Banker Meetings: Never    Marital Status: Widowed  Intimate Partner Violence: Not At Risk (08/30/2023)   Humiliation, Afraid, Rape, and Kick questionnaire    Fear of Current or Ex-Partner: No    Emotionally Abused: No    Physically Abused: No    Sexually Abused: No  Depression (PHQ2-9): High Risk (09/14/2023)   Depression (PHQ2-9)    PHQ-2 Score: 12  Alcohol Screen: Low Risk (08/30/2023)   Alcohol Screen    Last Alcohol Screening Score (AUDIT): 7  Housing: Unknown (08/30/2023)   Housing Stability Vital Sign    Unable to Pay for Housing in the Last Year: No    Number of Times Moved in the Last Year: Not on file    Homeless in the Last Year: No  Utilities: Not At Risk (08/30/2023)   [facility] Utilities    Threatened with loss of utilities: No  Health Literacy: Adequate Health Literacy (08/30/2023)   B1300 Health Literacy    Frequency of need for help with medical instructions: Never      Review of Systems  All other systems reviewed and are negative.      Objective:   Physical Exam Vitals reviewed.  Constitutional:      General: She is not in acute distress.    Appearance: Normal appearance. She is normal weight. She is not ill-appearing or toxic-appearing.  Cardiovascular:     Rate and Rhythm: Normal rate and regular rhythm.     Heart sounds: Normal heart sounds.  Pulmonary:     Effort: Pulmonary effort is normal. No respiratory distress.     Breath sounds: Normal breath sounds. No  stridor. No wheezing, rhonchi or rales.  Chest:     Chest wall: No tenderness.  Abdominal:     General: Bowel sounds are normal.     Palpations: Abdomen is soft.  Musculoskeletal:     Right lower leg: No edema.     Left lower  leg: No edema.  Neurological:     Mental Status: She is alert.           Assessment & Plan:  Benign essential HTN - Plan: CBC with Differential/Platelet, Comprehensive metabolic panel with GFR, Lipid panel  Hypothyroidism, unspecified type - Plan: TSH Patient seems to be doing extremely well.  She has no complaints today.  I would like to obtain baseline lab work to monitor her renal function along with her liver function test and her cholesterol.  I would like to see her LDL cholesterol less than 899.  If her labs are stable I will plan to refill her blood pressure medication Hyzaar and Crestor  which she takes for high cholesterol.  I have asked the patient to check her blood pressure daily at home.  If consistently less than 120/80, we may want to decrease the dose of Hyzaar to avoid lightheadedness and dizziness.  Also monitor a TSH to ensure adequate dosage of levothyroxine .  Patient refuses a mammogram or bone density test. "

## 2024-10-17 ENCOUNTER — Ambulatory Visit: Payer: Self-pay | Admitting: Family Medicine

## 2024-10-17 ENCOUNTER — Other Ambulatory Visit: Payer: Self-pay

## 2024-10-17 DIAGNOSIS — F411 Generalized anxiety disorder: Secondary | ICD-10-CM

## 2024-10-17 DIAGNOSIS — J449 Chronic obstructive pulmonary disease, unspecified: Secondary | ICD-10-CM

## 2024-10-17 DIAGNOSIS — E039 Hypothyroidism, unspecified: Secondary | ICD-10-CM

## 2024-10-17 DIAGNOSIS — E7800 Pure hypercholesterolemia, unspecified: Secondary | ICD-10-CM

## 2024-10-17 DIAGNOSIS — J302 Other seasonal allergic rhinitis: Secondary | ICD-10-CM

## 2024-10-17 DIAGNOSIS — I471 Supraventricular tachycardia, unspecified: Secondary | ICD-10-CM

## 2024-10-17 DIAGNOSIS — R55 Syncope and collapse: Secondary | ICD-10-CM

## 2024-10-17 DIAGNOSIS — I10 Essential (primary) hypertension: Secondary | ICD-10-CM

## 2024-10-17 LAB — COMPREHENSIVE METABOLIC PANEL WITH GFR
AG Ratio: 2 (calc) (ref 1.0–2.5)
ALT: 13 U/L (ref 6–29)
AST: 15 U/L (ref 10–35)
Albumin: 4.3 g/dL (ref 3.6–5.1)
Alkaline phosphatase (APISO): 48 U/L (ref 37–153)
BUN: 16 mg/dL (ref 7–25)
CO2: 32 mmol/L (ref 20–32)
Calcium: 9.4 mg/dL (ref 8.6–10.4)
Chloride: 100 mmol/L (ref 98–110)
Creat: 0.86 mg/dL (ref 0.60–0.95)
Globulin: 2.1 g/dL (ref 1.9–3.7)
Glucose, Bld: 96 mg/dL (ref 65–99)
Potassium: 4.2 mmol/L (ref 3.5–5.3)
Sodium: 139 mmol/L (ref 135–146)
Total Bilirubin: 0.4 mg/dL (ref 0.2–1.2)
Total Protein: 6.4 g/dL (ref 6.1–8.1)
eGFR: 67 mL/min/{1.73_m2}

## 2024-10-17 LAB — CBC WITH DIFFERENTIAL/PLATELET
Absolute Lymphocytes: 2088 {cells}/uL (ref 850–3900)
Absolute Monocytes: 835 {cells}/uL (ref 200–950)
Basophils Absolute: 78 {cells}/uL (ref 0–200)
Basophils Relative: 0.9 %
Eosinophils Absolute: 261 {cells}/uL (ref 15–500)
Eosinophils Relative: 3 %
HCT: 41.2 % (ref 35.9–46.0)
Hemoglobin: 13.8 g/dL (ref 11.7–15.5)
MCH: 31.7 pg (ref 27.0–33.0)
MCHC: 33.5 g/dL (ref 31.6–35.4)
MCV: 94.7 fL (ref 81.4–101.7)
MPV: 10.8 fL (ref 7.5–12.5)
Monocytes Relative: 9.6 %
Neutro Abs: 5438 {cells}/uL (ref 1500–7800)
Neutrophils Relative %: 62.5 %
Platelets: 265 10*3/uL (ref 140–400)
RBC: 4.35 Million/uL (ref 3.80–5.10)
RDW: 13.3 % (ref 11.0–15.0)
Total Lymphocyte: 24 %
WBC: 8.7 10*3/uL (ref 3.8–10.8)

## 2024-10-17 LAB — LIPID PANEL
Cholesterol: 162 mg/dL
HDL: 55 mg/dL
LDL Cholesterol (Calc): 73 mg/dL
Non-HDL Cholesterol (Calc): 107 mg/dL
Total CHOL/HDL Ratio: 2.9 (calc)
Triglycerides: 256 mg/dL — ABNORMAL HIGH

## 2024-10-17 LAB — TSH: TSH: 1.45 m[IU]/L (ref 0.40–4.50)

## 2024-10-17 MED ORDER — ROSUVASTATIN CALCIUM 20 MG PO TABS: 20.0000 mg | ORAL_TABLET | Freq: Every day | ORAL | 1 refills | Status: AC

## 2024-10-17 MED ORDER — FLUOXETINE HCL 20 MG PO TABS: 40.0000 mg | ORAL_TABLET | Freq: Every day | ORAL | 1 refills | Status: AC

## 2024-10-17 MED ORDER — LEVOCETIRIZINE DIHYDROCHLORIDE 5 MG PO TABS: 5.0000 mg | ORAL_TABLET | Freq: Every evening | ORAL | 1 refills | Status: AC

## 2024-10-17 MED ORDER — LEVOTHYROXINE SODIUM 125 MCG PO TABS: 125.0000 ug | ORAL_TABLET | Freq: Every day | ORAL | 1 refills | Status: AC

## 2024-10-17 MED ORDER — TRELEGY ELLIPTA 100-62.5-25 MCG/ACT IN AEPB: 1.0000 | INHALATION_SPRAY | Freq: Every day | RESPIRATORY_TRACT | 5 refills | Status: AC

## 2024-10-17 MED ORDER — LOSARTAN POTASSIUM-HCTZ 100-25 MG PO TABS: 1.0000 | ORAL_TABLET | Freq: Every day | ORAL | 2 refills | Status: AC

## 2024-10-17 MED ORDER — BUPROPION HCL ER (XL) 150 MG PO TB24: 150.0000 mg | ORAL_TABLET | Freq: Every day | ORAL | 1 refills | Status: AC

## 2024-10-17 MED ORDER — ALBUTEROL SULFATE HFA 108 (90 BASE) MCG/ACT IN AERS: 1.0000 | INHALATION_SPRAY | Freq: Four times a day (QID) | RESPIRATORY_TRACT | 2 refills | Status: AC | PRN

## 2024-10-17 MED ORDER — METOPROLOL SUCCINATE ER 25 MG PO TB24: 25.0000 mg | ORAL_TABLET | Freq: Every day | ORAL | 1 refills | Status: AC

## 2024-10-27 ENCOUNTER — Ambulatory Visit: Admitting: Internal Medicine

## 2024-10-28 ENCOUNTER — Ambulatory Visit: Admitting: Internal Medicine

## 2024-11-03 ENCOUNTER — Inpatient Hospital Stay: Payer: TRICARE For Life (TFL)

## 2024-11-03 ENCOUNTER — Ambulatory Visit (HOSPITAL_COMMUNITY)
